# Patient Record
Sex: Male | Born: 1950 | Race: Black or African American | Hispanic: No | Smoking: Former smoker
Health system: Southern US, Community
[De-identification: ages and names within clinical notes are randomized; demographics above are authoritative.]

## PROBLEM LIST (undated history)

## (undated) DIAGNOSIS — R7303 Prediabetes: Secondary | ICD-10-CM

## (undated) DIAGNOSIS — E785 Hyperlipidemia, unspecified: Secondary | ICD-10-CM

## (undated) DIAGNOSIS — I10 Essential (primary) hypertension: Secondary | ICD-10-CM

## (undated) DIAGNOSIS — N529 Male erectile dysfunction, unspecified: Secondary | ICD-10-CM

## (undated) DIAGNOSIS — F4310 Post-traumatic stress disorder, unspecified: Secondary | ICD-10-CM

## (undated) DIAGNOSIS — I509 Heart failure, unspecified: Secondary | ICD-10-CM

## (undated) DIAGNOSIS — G4730 Sleep apnea, unspecified: Secondary | ICD-10-CM

## (undated) DIAGNOSIS — F1910 Other psychoactive substance abuse, uncomplicated: Secondary | ICD-10-CM

## (undated) HISTORY — DX: Heart failure, unspecified: I50.9

## (undated) HISTORY — DX: Male erectile dysfunction, unspecified: N52.9

## (undated) HISTORY — PX: CARDIAC CATHETERIZATION: SHX172

---

## 2000-07-27 ENCOUNTER — Encounter: Payer: Self-pay | Admitting: Emergency Medicine

## 2000-07-27 ENCOUNTER — Emergency Department (HOSPITAL_COMMUNITY): Admission: EM | Admit: 2000-07-27 | Discharge: 2000-07-27 | Payer: Self-pay | Admitting: Emergency Medicine

## 2002-07-04 ENCOUNTER — Emergency Department (HOSPITAL_COMMUNITY): Admission: EM | Admit: 2002-07-04 | Discharge: 2002-07-04 | Payer: Self-pay | Admitting: Emergency Medicine

## 2002-07-04 ENCOUNTER — Encounter: Payer: Self-pay | Admitting: Emergency Medicine

## 2003-06-20 ENCOUNTER — Inpatient Hospital Stay (HOSPITAL_COMMUNITY): Admission: EM | Admit: 2003-06-20 | Discharge: 2003-06-23 | Payer: Self-pay | Admitting: Emergency Medicine

## 2005-03-21 ENCOUNTER — Inpatient Hospital Stay (HOSPITAL_COMMUNITY): Admission: EM | Admit: 2005-03-21 | Discharge: 2005-03-22 | Payer: Self-pay | Admitting: Emergency Medicine

## 2005-03-21 ENCOUNTER — Ambulatory Visit: Payer: Self-pay | Admitting: Cardiovascular Disease

## 2005-04-26 ENCOUNTER — Emergency Department (HOSPITAL_COMMUNITY): Admission: EM | Admit: 2005-04-26 | Discharge: 2005-04-27 | Payer: Self-pay | Admitting: Emergency Medicine

## 2007-03-06 ENCOUNTER — Emergency Department (HOSPITAL_COMMUNITY): Admission: EM | Admit: 2007-03-06 | Discharge: 2007-03-06 | Payer: Self-pay | Admitting: Emergency Medicine

## 2008-11-27 ENCOUNTER — Emergency Department (HOSPITAL_COMMUNITY): Admission: EM | Admit: 2008-11-27 | Discharge: 2008-11-27 | Payer: Self-pay | Admitting: Emergency Medicine

## 2009-02-04 ENCOUNTER — Emergency Department (HOSPITAL_COMMUNITY): Admission: EM | Admit: 2009-02-04 | Discharge: 2009-02-04 | Payer: Self-pay | Admitting: Emergency Medicine

## 2009-04-29 ENCOUNTER — Emergency Department (HOSPITAL_COMMUNITY): Admission: EM | Admit: 2009-04-29 | Discharge: 2009-04-29 | Payer: Self-pay | Admitting: Emergency Medicine

## 2009-09-25 ENCOUNTER — Emergency Department (HOSPITAL_COMMUNITY): Admission: EM | Admit: 2009-09-25 | Discharge: 2009-09-25 | Payer: Self-pay | Admitting: Emergency Medicine

## 2009-11-23 ENCOUNTER — Emergency Department (HOSPITAL_COMMUNITY): Admission: EM | Admit: 2009-11-23 | Discharge: 2009-11-23 | Payer: Self-pay | Admitting: Emergency Medicine

## 2010-02-22 ENCOUNTER — Emergency Department (HOSPITAL_COMMUNITY): Admission: EM | Admit: 2010-02-22 | Discharge: 2010-02-22 | Payer: Self-pay | Admitting: Emergency Medicine

## 2010-02-22 ENCOUNTER — Inpatient Hospital Stay (HOSPITAL_COMMUNITY): Admission: EM | Admit: 2010-02-22 | Discharge: 2010-02-25 | Payer: Self-pay | Admitting: Internal Medicine

## 2010-02-22 ENCOUNTER — Encounter: Payer: Self-pay | Admitting: Emergency Medicine

## 2010-04-23 ENCOUNTER — Inpatient Hospital Stay (HOSPITAL_COMMUNITY)
Admission: EM | Admit: 2010-04-23 | Discharge: 2010-04-27 | Payer: Self-pay | Source: Home / Self Care | Attending: Pulmonary Disease | Admitting: Pulmonary Disease

## 2010-05-25 ENCOUNTER — Inpatient Hospital Stay (HOSPITAL_COMMUNITY)
Admission: EM | Admit: 2010-05-25 | Discharge: 2010-05-27 | DRG: 191 | Disposition: A | Discharge status: Home / Self Care | Payer: Non-veteran care | Attending: Internal Medicine | Admitting: Internal Medicine

## 2010-05-25 DIAGNOSIS — J441 Chronic obstructive pulmonary disease with (acute) exacerbation: Principal | ICD-10-CM | POA: Diagnosis present

## 2010-05-25 DIAGNOSIS — N179 Acute kidney failure, unspecified: Secondary | ICD-10-CM | POA: Diagnosis present

## 2010-05-25 DIAGNOSIS — R9439 Abnormal result of other cardiovascular function study: Secondary | ICD-10-CM | POA: Diagnosis present

## 2010-05-25 DIAGNOSIS — F17200 Nicotine dependence, unspecified, uncomplicated: Secondary | ICD-10-CM | POA: Diagnosis present

## 2010-05-25 DIAGNOSIS — F1410 Cocaine abuse, uncomplicated: Secondary | ICD-10-CM | POA: Diagnosis present

## 2010-05-25 DIAGNOSIS — N189 Chronic kidney disease, unspecified: Secondary | ICD-10-CM | POA: Diagnosis present

## 2010-05-25 LAB — DIFFERENTIAL
Basophils Absolute: 0 10*3/uL (ref 0.0–0.1)
Basophils Relative: 1 % (ref 0–1)
Eosinophils Absolute: 0.3 10*3/uL (ref 0.0–0.7)
Eosinophils Relative: 6 % — ABNORMAL HIGH (ref 0–5)
Lymphocytes Relative: 33 % (ref 12–46)
Lymphs Abs: 1.7 10*3/uL (ref 0.7–4.0)
Monocytes Absolute: 0.6 10*3/uL (ref 0.1–1.0)
Monocytes Relative: 11 % (ref 3–12)
Neutro Abs: 2.6 10*3/uL (ref 1.7–7.7)
Neutrophils Relative %: 50 % (ref 43–77)

## 2010-05-25 LAB — CBC
HCT: 40.5 % (ref 39.0–52.0)
Hemoglobin: 13.4 g/dL (ref 13.0–17.0)
MCH: 30.2 pg (ref 26.0–34.0)
MCHC: 33.1 g/dL (ref 30.0–36.0)
MCV: 91.2 fL (ref 78.0–100.0)
Platelets: 220 10*3/uL (ref 150–400)
RBC: 4.44 MIL/uL (ref 4.22–5.81)
RDW: 13.8 % (ref 11.5–15.5)
WBC: 5.2 10*3/uL (ref 4.0–10.5)

## 2010-05-25 LAB — POCT I-STAT 3, ART BLOOD GAS (G3+)
Acid-base deficit: 3 mmol/L — ABNORMAL HIGH (ref 0.0–2.0)
Bicarbonate: 24 mEq/L (ref 20.0–24.0)
O2 Saturation: 97 %
Patient temperature: 98.7
TCO2: 25 mmol/L (ref 0–100)
pCO2 arterial: 47.5 mmHg — ABNORMAL HIGH (ref 35.0–45.0)
pH, Arterial: 7.312 — ABNORMAL LOW (ref 7.350–7.450)
pO2, Arterial: 96 mmHg (ref 80.0–100.0)

## 2010-05-25 LAB — BASIC METABOLIC PANEL
BUN: 13 mg/dL (ref 6–23)
CO2: 26 mEq/L (ref 19–32)
Calcium: 8.5 mg/dL (ref 8.4–10.5)
Chloride: 109 mEq/L (ref 96–112)
Creatinine, Ser: 1.69 mg/dL — ABNORMAL HIGH (ref 0.4–1.5)
GFR calc Af Amer: 51 mL/min — ABNORMAL LOW (ref >60)
GFR calc non Af Amer: 42 mL/min — ABNORMAL LOW (ref >60)
Glucose, Bld: 103 mg/dL — ABNORMAL HIGH (ref 70–99)
Potassium: 4.2 mEq/L (ref 3.5–5.1)
Sodium: 141 mEq/L (ref 135–145)

## 2010-05-25 LAB — POCT CARDIAC MARKERS
CKMB, poc: 2.9 ng/mL (ref 1.0–8.0)
Myoglobin, poc: 132 ng/mL (ref 12–200)
Troponin i, poc: <0.05 ng/mL (ref 0.00–0.09)

## 2010-05-25 NOTE — H&P (Signed)
NAME:  Gerald Williams, Gerald Williams NO.:  [id number]  MEDICAL RECORD NO.:  [id number]          PATIENT TYPE:  EMS  LOCATION:  MAJO                         FACILITY:  [facility]  PHYSICIAN:  Talmage Nap, MD  DATE OF BIRTH:  1950-11-30  DATE OF ADMISSION:  05/25/2010 DATE OF DISCHARGE:                             HISTORY & PHYSICAL   PRIMARY CARE PHYSICIAN:  Unassigned.  History obtainable from the patient.  CHIEF COMPLAINT:  Shortness of breath of 1 day's duration.  HISTORY OF PRESENT ILLNESS:  The patient is a 60 year old African American male with history of COPD, never been intubated, presenting to the emergency room with shortness of breath of 1 day's duration and this was said to be getting progressively worse.  The patient had been in stable health until about a day prior to presenting to the emergency room, when he got to his home and his room was filled with cigarette smoke which apparently was left by his friends visiting.  After inhaling the smoke, the patient started experiencing shortness of breath with chest tightness and this was getting progressively worse.  He said he had cough that was nonproductive of sputum.  He denied any diaphoresis. He denied any nausea or vomiting.  No fever, no chills, no rigor and subsequently presented to the emergency room to be evaluated.  PAST MEDICAL HISTORY:  COPD.  PAST SURGICAL HISTORY:  None.  PREADMISSION MEDICATIONS:  Without dosages include albuterol, Asmanex Twisthaler, Combivent, and ipratropium bromide.  SOCIAL HISTORY:  Negative for alcohol or tobacco use.  FAMILY HISTORY:  York Spaniel to be positive for hypertension and coronary artery disease.  REVIEW OF SYSTEMS:  The patient denies any history of headaches.  No blurred vision.  No nausea or vomiting.  No fever.  No chills.  No rigor.  Complained of increasing shortness of breath with chest tightness. Denies any diaphoresis.  No PND or orthopnea.  He has  cough that is non-productive of sputum.  No abdominal discomfort.  No diarrhea or hematochezia.  No dysuria or hematuria.  No swelling of the lower extremity.  No intolerance to heat or cold.  No neuropsychiatric disorder.  PHYSICAL EXAMINATION:  GENERAL:  Middle-aged man in respiratory distress, unable to complete a sentence, well hydrated. VITAL SIGNS:  Blood pressure is 111/87, pulse is 78, respiratory rate is 30, and temperature is 97.6. HEENT:  Pupils are reactive to light.  Extraocular muscles are intact. NECK:  No jugular venous distention.  No carotid bruit.  No lymphadenopathy. CHEST:  Widespread expiratory rhonchi.  No rales. CARDIOVASCULAR:  Heart sounds are 1 and 2. ABDOMEN:  Soft and nontender.  Liver, spleen, and kidney nonpalpable. Bowel sounds are positive. EXTREMITIES:  No pedal edema. NEUROLOGIC:  Nonfocal. MUSCULOSKELETAL:  Unremarkable. SKIN:  Normal turgor.  LABORATORY DATA:  Arterial blood gas showed pH of 7.31, pCO2 47.5, pO2 of 96.0 with a bicarb of 25.  Hematological indices showed WBC of 5.2, hemoglobin of 13.4, hematocrit of 40.5, MCV of 91.2 with a platelet count of 220, normal differential.  First set of cardiac markers, troponin I less than 0.05.  Chemistry showed sodium of 141,  potassium of 4.2, chloride of 109 with a bicarb of 28, glucose is 103, BUN is 13, creatinine is 1.69.  EKG showed sinus tachycardia with normal sinus rhythm with a rate of 99, no acute ST-wave change noted.  Chest x-ray showed hyperinflated lungs, no infiltrates seen.  ADMITTING IMPRESSION: 1. Chronic obstructive pulmonary disease exacerbation. 2. Hypercapnia. 3. Mild renal insufficiency.  PLAN:  Admit the patient to telemetry.  The patient will be on O2 via nasal cannula 3 times per minute, albuterol and Atrovent nebs q.4 h., Solu-Medrol 60 mg IV q.12, Avelox 400 mg IV q.24, Protonix 40 mg IV q.24 for GI prophylaxis, and TED stockings for DVT prophylaxis.  Further  labs to be done on the patient include cardiac enzymes q.6 x3.  CBC, CMP, and magnesium will be repeated in a.m. The patient will be followed and evaluated on day-to-day basis.     Talmage Nap, MD     CN/MEDQ  D:  05/25/2010  T:  05/25/2010  Job:  [id number]  Electronically Signed by Talmage Nap  on 05/25/2010 11:41:05 PM

## 2010-05-26 LAB — DIFFERENTIAL
Eosinophils Absolute: 0 10*3/uL (ref 0.0–0.7)
Eosinophils Relative: 0 % (ref 0–5)
Lymphocytes Relative: 10 % — ABNORMAL LOW (ref 12–46)
Lymphs Abs: 0.5 10*3/uL — ABNORMAL LOW (ref 0.7–4.0)
Monocytes Relative: 0 % — ABNORMAL LOW (ref 3–12)
Neutrophils Relative %: 89 % — ABNORMAL HIGH (ref 43–77)

## 2010-05-26 LAB — DRUGS OF ABUSE SCREEN W/O ALC, ROUTINE URINE
Amphetamine Screen, Ur: NEGATIVE
Barbiturate Quant, Ur: NEGATIVE
Cocaine Metabolites: POSITIVE — AB
Methadone: NEGATIVE
Phencyclidine (PCP): NEGATIVE

## 2010-05-26 LAB — CK TOTAL AND CKMB (NOT AT ARMC)
CK, MB: 10.6 ng/mL (ref 0.3–4.0)
Relative Index: 1.9 (ref 0.0–2.5)
Total CK: 569 U/L — ABNORMAL HIGH (ref 7–232)

## 2010-05-26 LAB — COMPREHENSIVE METABOLIC PANEL
Alkaline Phosphatase: 48 U/L (ref 39–117)
BUN: 14 mg/dL (ref 6–23)
Chloride: 108 mEq/L (ref 96–112)
Creatinine, Ser: 1.76 mg/dL — ABNORMAL HIGH (ref 0.4–1.5)
GFR calc non Af Amer: 40 mL/min — ABNORMAL LOW (ref >60)
Glucose, Bld: 196 mg/dL — ABNORMAL HIGH (ref 70–99)
Potassium: 4.4 mEq/L (ref 3.5–5.1)
Total Bilirubin: 0.6 mg/dL (ref 0.3–1.2)

## 2010-05-26 LAB — CARDIAC PANEL(CRET KIN+CKTOT+MB+TROPI)
CK, MB: 10.6 ng/mL (ref 0.3–4.0)
Relative Index: 2.2 (ref 0.0–2.5)
Total CK: 513 U/L — ABNORMAL HIGH (ref 7–232)
Troponin I: <0.01 ng/mL (ref 0.00–0.06)
Troponin I: <0.01 ng/mL (ref 0.00–0.06)

## 2010-05-26 LAB — URINALYSIS, MICROSCOPIC ONLY
Ketones, ur: NEGATIVE mg/dL
Leukocytes, UA: NEGATIVE
Nitrite: NEGATIVE
Specific Gravity, Urine: 1.014 (ref 1.005–1.030)
Urine Glucose, Fasting: NEGATIVE mg/dL
pH: 5.5 (ref 5.0–8.0)

## 2010-05-26 LAB — CBC
HCT: 41.4 % (ref 39.0–52.0)
MCH: 30.9 pg (ref 26.0–34.0)
MCV: 91.4 fL (ref 78.0–100.0)
Platelets: 229 10*3/uL (ref 150–400)
RBC: 4.53 MIL/uL (ref 4.22–5.81)
WBC: 4.7 10*3/uL (ref 4.0–10.5)

## 2010-05-26 LAB — BRAIN NATRIURETIC PEPTIDE: Pro B Natriuretic peptide (BNP): <30 pg/mL (ref 0.0–100.0)

## 2010-05-26 LAB — TROPONIN I: Troponin I: <0.01 ng/mL (ref 0.00–0.06)

## 2010-05-27 LAB — BASIC METABOLIC PANEL
BUN: 13 mg/dL (ref 6–23)
CO2: 22 mEq/L (ref 19–32)
Chloride: 106 mEq/L (ref 96–112)
Creatinine, Ser: 1.48 mg/dL (ref 0.4–1.5)

## 2010-05-27 LAB — CBC
Hemoglobin: 12.8 g/dL — ABNORMAL LOW (ref 13.0–17.0)
MCH: 30.8 pg (ref 26.0–34.0)
MCHC: 34 g/dL (ref 30.0–36.0)
MCV: 90.4 fL (ref 78.0–100.0)
Platelets: 209 10*3/uL (ref 150–400)
RBC: 4.16 MIL/uL — ABNORMAL LOW (ref 4.22–5.81)

## 2010-05-29 LAB — COCAINE, URINE, CONFIRMATION: Benzoylecgonine GC/MS Conf: 6074 ng/mL — ABNORMAL HIGH

## 2010-06-03 NOTE — Discharge Summary (Signed)
NAME:  Gerald Williams, Gerald Williams              ACCOUNT NO.:  [id number]  MEDICAL RECORD NO.:  [id number]           PATIENT TYPE:  I  LOCATION:  4735                         FACILITY:  [facility]  PHYSICIAN:  Hartley Barefoot, MD    DATE OF BIRTH:  31-Jan-1951  DATE OF ADMISSION:  05/25/2010 DATE OF DISCHARGE:  05/27/2010                              DISCHARGE SUMMARY   DISCHARGE DIAGNOSES: 1. Chronic obstructive pulmonary disease exacerbation. 2. Substance abuse positive for cocaine. 3. Acute-on-chronic renal failure. 4. Chronic elevation of CK-MB.  OTHER PAST MEDICAL HISTORY: 1. COPD. 2. History of polysubstance abuse. 3. History of steroid-induced hyperglycemia.  DISCHARGE MEDICATIONS: 1. Moxifloxacin 400 mg 1 tablet by mouth for three more days. 2. Prednisone 10 mg taper dose, he will take 4 tablets for 2 days,     then 2 tablets for 2 days, then 1 tablet, then stop. 3. Albuterol nebulizations 2.5 mg one nebulization inhaled every 6     hours as needed. 4. Asmanex 220 mcg 1 puff inhaled daily. 5. Combivent 2 puffs inhaled twice daily. 6. Ipratropium nebulization, 1 nebulization inhale every 6 hours. 7. Nicotine patch 21 mcg daily.  [name]  STUDIES PERFORMED:  Chest x-ray May 25, 2010, showed COPD.  No active disease.  BRIEF HISTORY OF PRESENT ILLNESS:  This is a 60 year old African American with past medical history of COPD, never intubated presented to the emergency department complaining of shortness of breath that started a day prior to admission.  The patient relayed that shortness of breath was progressively getting worse.  He was in contact with a cigarette smoke, passive smoking.  He denies cough, fevers, chills.  Denies recreational drugs.  HOSPITAL COURSE: 1. COPD exacerbation in the setting of passive smoker and cocaine use.     The patient was admitted to telemetry.  He was started on Solu-     Medrol 60 mg IV q.12 h. And Avelox.   His shortness of breath     improved.  On the day of discharge, no wheezing on lung exam.  He     will be discharged on prednisone taper dose and he will need three     more days of Avelox.  He will need to follow with primary care     physician.  He was advised to stop smoking and using recreational     drugs. 2. Acute-on-chronic renal failure, creatinine on last hospitalization     at the day of discharge was at 1.58.  Reviewing records, his     creatinine range is  from 1.2 to 1.9. After gentle hydration with 75 mL     of normal saline for 24 hours, his creatinine decreased from 1.75     to 1.46.  He will need to follow up with his primary care     physician. 3. Chronic elevation of CK-MB.  EKG with no ST changes.  Troponin x3     negative.  In reviewing records, the patient had chronic elevation     of CK-MB.  He will need to follow with his primary care  physician     and he will need workup as an outpatient.  On the day of discharge, the patient was in good condition.  Shortness of breath has resolved.  DISCHARGE VITAL SIGNS:  Temperature 97.6, pulse 78, respirations 18, blood pressure 102/65, sats 93% on room air.  DISCHARGE LABS:  Sodium 137, potassium 4.3, chloride 106, bicarb 22, glucose 163, BUN 13, creatinine 1.48.  White blood cell 8.5, hemoglobin 12.8, platelets 209.  The patient was discharged in improved condition.     Hartley Barefoot, MD     BR/MEDQ  D:  05/27/2010  T:  05/28/2010  Job:  [id number]  cc:   [facility]  Electronically Signed by Hartley Barefoot MD on 06/03/2010 08:12:03 AM

## 2010-06-11 ENCOUNTER — Inpatient Hospital Stay (HOSPITAL_COMMUNITY)
Admission: EM | Admit: 2010-06-11 | Discharge: 2010-06-13 | DRG: 191 | Disposition: A | Discharge status: Home / Self Care | Payer: Non-veteran care | Attending: Internal Medicine | Admitting: Internal Medicine

## 2010-06-11 ENCOUNTER — Emergency Department (HOSPITAL_COMMUNITY): Payer: Non-veteran care

## 2010-06-11 DIAGNOSIS — N182 Chronic kidney disease, stage 2 (mild): Secondary | ICD-10-CM | POA: Diagnosis present

## 2010-06-11 DIAGNOSIS — F1410 Cocaine abuse, uncomplicated: Secondary | ICD-10-CM | POA: Diagnosis present

## 2010-06-11 DIAGNOSIS — J441 Chronic obstructive pulmonary disease with (acute) exacerbation: Principal | ICD-10-CM | POA: Diagnosis present

## 2010-06-11 DIAGNOSIS — Z79899 Other long term (current) drug therapy: Secondary | ICD-10-CM

## 2010-06-11 DIAGNOSIS — IMO0002 Reserved for concepts with insufficient information to code with codable children: Secondary | ICD-10-CM

## 2010-06-11 DIAGNOSIS — K219 Gastro-esophageal reflux disease without esophagitis: Secondary | ICD-10-CM | POA: Diagnosis present

## 2010-06-11 DIAGNOSIS — F17200 Nicotine dependence, unspecified, uncomplicated: Secondary | ICD-10-CM | POA: Diagnosis present

## 2010-06-11 DIAGNOSIS — N179 Acute kidney failure, unspecified: Secondary | ICD-10-CM | POA: Diagnosis present

## 2010-06-11 LAB — DIFFERENTIAL
Eosinophils Absolute: 0.6 10*3/uL (ref 0.0–0.7)
Eosinophils Relative: 13 % — ABNORMAL HIGH (ref 0–5)
Lymphs Abs: 1.5 10*3/uL (ref 0.7–4.0)
Monocytes Relative: 8 % (ref 3–12)

## 2010-06-11 LAB — BASIC METABOLIC PANEL
CO2: 27 mEq/L (ref 19–32)
Calcium: 8.7 mg/dL (ref 8.4–10.5)
Chloride: 109 mEq/L (ref 96–112)
GFR calc Af Amer: 43 mL/min — ABNORMAL LOW (ref >60)
Sodium: 142 mEq/L (ref 135–145)

## 2010-06-11 LAB — CK TOTAL AND CKMB (NOT AT ARMC)
CK, MB: 4.5 ng/mL — ABNORMAL HIGH (ref 0.3–4.0)
Relative Index: 1 (ref 0.0–2.5)

## 2010-06-11 LAB — CBC
MCH: 31.6 pg (ref 26.0–34.0)
MCHC: 35.1 g/dL (ref 30.0–36.0)
MCV: 90 fL (ref 78.0–100.0)
Platelets: 212 10*3/uL (ref 150–400)
RDW: 13.5 % (ref 11.5–15.5)

## 2010-06-12 ENCOUNTER — Inpatient Hospital Stay (HOSPITAL_COMMUNITY): Payer: Non-veteran care

## 2010-06-12 LAB — MRSA PCR SCREENING: MRSA by PCR: NEGATIVE

## 2010-06-12 LAB — COMPREHENSIVE METABOLIC PANEL
ALT: 17 U/L (ref 0–53)
AST: 31 U/L (ref 0–37)
Albumin: 3.1 g/dL — ABNORMAL LOW (ref 3.5–5.2)
Calcium: 8.7 mg/dL (ref 8.4–10.5)
GFR calc Af Amer: 52 mL/min — ABNORMAL LOW (ref >60)
Sodium: 140 mEq/L (ref 135–145)
Total Protein: 5.6 g/dL — ABNORMAL LOW (ref 6.0–8.3)

## 2010-06-12 LAB — CARDIAC PANEL(CRET KIN+CKTOT+MB+TROPI)
CK, MB: 6.3 ng/mL (ref 0.3–4.0)
Relative Index: 1.2 (ref 0.0–2.5)
Total CK: 474 U/L — ABNORMAL HIGH (ref 7–232)

## 2010-06-12 LAB — CBC
Hemoglobin: 13.9 g/dL (ref 13.0–17.0)
MCHC: 34.5 g/dL (ref 30.0–36.0)
RDW: 13.5 % (ref 11.5–15.5)

## 2010-06-12 LAB — MAGNESIUM: Magnesium: 2.4 mg/dL (ref 1.5–2.5)

## 2010-06-13 LAB — BASIC METABOLIC PANEL
CO2: 20 mEq/L (ref 19–32)
GFR calc non Af Amer: 47 mL/min — ABNORMAL LOW (ref >60)
Glucose, Bld: 150 mg/dL — ABNORMAL HIGH (ref 70–99)
Potassium: 4.8 mEq/L (ref 3.5–5.1)
Sodium: 140 mEq/L (ref 135–145)

## 2010-06-21 NOTE — Discharge Summary (Signed)
NAME:  Gerald Williams, Gerald Williams              ACCOUNT NO.:  [id number]  MEDICAL RECORD NO.:  [id number]           PATIENT TYPE:  I  LOCATION:  2506                         FACILITY:  [facility]  PHYSICIAN:  Hartley Barefoot, MD    DATE OF BIRTH:  06-01-1950  DATE OF ADMISSION:  06/11/2010 DATE OF DISCHARGE:  06/13/2010                              DISCHARGE SUMMARY   DISCHARGE DIAGNOSES: 1. Chronic obstructive pulmonary disease exacerbation. 2. Acute-on-chronic renal failure. 3. Chronic elevation of CK-MB. 4. Substance abuse.  OTHER PAST MEDICAL HISTORY: 1. History of steroid-induced hyperglycemia. 2. History of COPD.  DISCHARGE MEDICATIONS: 1. Avelox 1 tablet by mouth daily for 3 more days. 2. Prednisone taper 10 mg tablet, he will take 4 tablets for 2 days     then 2 tablets for 2 days then 1 tablet for 2 days then half tablet     then stop. 3. Albuterol inhale every 6 hours as needed. 4. Asmanex 1 puff inhale daily. 5. Combivent 2 puffs inhale b.i.d. 6. Ipratropium nebulization every 6 hours.  DISPOSITION AND FOLLOWUP:  Mr. Gerald Williams wants to go home today.  He is feeling well.  He will need to follow with his primary care physician, at the [place].  During that appointment, kidney function need to be followed up, his COPD status, he will need a 2-D echo due to his chronic elevation of CK-MB.  BRIEF HISTORY OF PRESENT ILLNESS:  This is a 60 year old that presents complaining of shortness of breath that started 2-3 days prior to admission.  He was having some cold-like symptoms and cough and congestion.  He initially treated himself with over-the-counter Tylenol. He was having a lot of difficulty with shortness of breath on exertion and shortness of breath was getting worse, so he decided to come to the emergency department.  STUDIES PERFORMED:  Renal ultrasound on June 12, 2010, showed negative for hydronephrosis or acute finding by ultrasound.  Chest x-ray June 11, 2010, showed  COPD.  No active disease.  HOSPITAL COURSE: 1. COPD exacerbation.  The patient was admitted to the step-down unit.     He required IV Solu-Medrol 60 mg q.12 h.  Avelox was started at 400     mg IV.  During the course of hospitalization, his shortness of     breath improved and he was transitioned to prednisone taper dose.     On ambulation, the patient's sats was 95 on room air.  He says     that he is feeling better and he is not having any shortness of     breath and he wants to go home.  He will need to follow with his     primary care physician. 2. Polysubstance abuse.  The patient was cocaine positive during this     admission.  He was counseled.  He relates that he is going to     follow at the [place] and that he will try to do rehab.  A friend will     help him to get into a rehab facility for drugs. 3. Acute-on-chronic renal failure.  Per records, creatinine goes from  1.4 to 1.9.  During his last hospitalization, his creatinine     increased up to 1.76.  After IV fluid his creatinine on the date of     discharge, was as 1.48.  During this hospitalization, on June 11, 2010, creatinine was 1.96.  After IV fluid, creatinine     decreased to 1.53.  Renal ultrasound was ordered and it was     negative for hydronephrosis or obstruction.  His blood pressure has     remained stable in the 114 range.  He will need further workup.  He     had a prior urinalysis on April 03, 2010, that was negative for     protein.  He will need to further workup and he had urine     microscopy on May 26, 2010, that was also negative for protein.     He will need to follow up with his primary care physician for     further evaluation. 4. Chronic elevation of CK-MB.  This could be secondary to decreased     renal clearence.  He will need to follow with his primary care     physician.  He will need a 2-D echo.  During this admission,     troponin remained negative.  EKG without changes.  On  the day of discharge, the patient was in improved condition.  No shortness of breath.  Sats 100% on room air, blood pressure 114/75, respirations 18, pulse 78, temperature 97.7.  DISCHARGE LABS:  Sodium 140, potassium 4.8, chloride 114, bicarb 20, glucose 150, BUN 20, creatinine 1.53.  CBC on June 12, 2010, white blood cell 4.5, hemoglobin 13.9, platelets 220.  The patient was discharged in improved condition.     Hartley Barefoot, MD     BR/MEDQ  D:  06/13/2010  T:  06/14/2010  Job:  [id number]  Electronically Signed by Hartley Barefoot MD on 06/21/2010 10:11:57 AM

## 2010-06-28 NOTE — H&P (Signed)
NAME:  Gerald Williams, Gerald Williams NO.:  [id number]  MEDICAL RECORD NO.:  [id number]           PATIENT TYPE:  LOCATION:                                 FACILITY:  PHYSICIAN:  Della Goo, M.D. DATE OF BIRTH:  1950/08/29  DATE OF ADMISSION: DATE OF DISCHARGE:                             HISTORY & PHYSICAL   CHIEF COMPLAINT:  Shortness of breath.  PRIMARY CARE PHYSICIAN:  [facility]  HISTORY OF PRESENT ILLNESS:  The patient is a 60 year old male who presents with a chief complaint of progressive shortness of breath over the course of the past 2-3 days.  The patient states that his symptoms started last Sunday in which he had cold-like symptoms of cough and congestion.  He treated himself with over-the-counter Tylenol Cold and Flu.  He stated as of Tuesday he felt much better.  The patient is on veterans disability.  He stated that he [place]  He stated on Wednesday when he woke up he felt much worse.  He was unable to do his normal activities.  Walking, he reports, causes increased shortness of breath.  He had increase in cough.  He had to use his albuterol more. 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  He stated that he did complete his steroid taper; however, he was unable to tell me when this was.  The patient does have a history of cocaine abuse.  His urinary drug screen today shows that he is positive for cocaine abuse.  He denies that he used cocaine state.  However, he states that he was around someone who used it.  The patient states that he has now quit smoking.  He states that he quit smoking 4 days ago secondary to being unable to breathe while smoking.  He does report cough with sputum production.  He states the sputum is white and more than his usual.  He denies rigors, chills, or fever.  The patient  denies any change in stool color, abdominal pain, or dysuria.  The patient states that on his last discharge he was told that he had problems with his kidneys and he was to see his doctor about this.  He has an appointment at the [facility] this coming Wednesday on June 16, 2010, for evaluation of chronic kidney disease.  Per the patient's report, he has been using his nebulizers as directed.  He did state that he went through an entire albuterol inhaler today.  He denies any rhinorrhea or ear pain at present.  He has had his flu shot and pneumococcal vaccination on his last admission.  PAST MEDICAL HISTORY: 1. COPD. 2. Questionable chronic kidney disease. 3. Substance abuse, cocaine. 4. Chronic elevation of CK-MB.  PAST SURGICAL HISTORY:  None.  SOCIAL HISTORY:  Positive for substance abuse.  After further discussion with the patient, he did admit to cocaine abuse.  The patient denies alcohol abuse.  The patient has recently been unable to use tobacco; however, he has a 50-year tobacco abuse history, currently smoking one and  half to two packs of cigarettes per day.  FAMILY HISTORY:  Positive for hypertension and coronary artery disease.  MEDICATIONS:  Albuterol, Asmanex, Combivent, and ipratropium bromide.  ALLERGIES:  No known medicine allergies.  REVIEW OF SYSTEMS:  GENERAL:  The patient denies weight change, fatigue or weakness, fever, chills, or night sweats.  SKIN:  The patient denies any changes in skin, hair, nails.  HEENT:  The patient denies headache, change in vision, tinnitus, rhinorrhea or stuffiness, or hoarseness or sore throat.  CARDIAC:  The patient denies hypertension, murmur, orthopnea.  RESPIRATORY:  The patient does report shortness of breath, wheeze, cough, and sputum.  Denies hemoptysis.  GI:  The patient denies change in appetite, nausea, vomiting, or indigestion.  URINARY:  The patient reports significant nocturia.  Denies frequency, hesitancy,  or urgency.  VASCULAR:  The patient denies leg edema or claudication. MUSCULOSKELETAL:  The patient denies muscle weakness, joint stiffness, or change in range of motion.  NEUROLOGIC:  The patient denies loss of sensation, numbness, tingling, or tremors.  HEMATOLOGIC:  The patient denies anemia, easy bruising, bleeding.  ENDOCRINE:  The patient denies heat or cold intolerance.  PSYCHIATRIC:  The patient denies weight changes, anxiety, or depression.  PHYSICAL EXAMINATION:  VITALS:  Blood pressure 109/76, respiration rate 20, pulse rate 80. GENERAL:  The patient is a thin-appearing African American male in no apparent distress. SKIN:  Appears to be intact with no rashes or bruises. HEENT:  Head normocephalic, atraumatic.  Pupils round and reactive to light.  Ears are normal.  Nose normal.  Trachea midline. NECK:  Supple. CARDIOVASCULAR:  Normal S1 and S2.  Regular rate and regular rhythm. LUNGS:  He has expiratory and inspiratory wheezes throughout.  His rate of air movement is somewhat limited; however, he does not appear to be in respiratory distress. ABDOMEN:  Soft, nontender.  Positive bowel sounds. GU:  Deferred. MUSCULOSKELETAL:  There are no signs of muscle atrophy or weakness. Full joint range of motion in upper and lower extremities. LYMPHATIC:  No lymphadenopathy. NEUROLOGIC:  Alert and oriented x3.  Cranial nerves II through II are grossly intact.  LABORATORY DATA:  Urinary drug screen was positive for cocaine.  Sodium 142, potassium 4, chloride 109, glucose 126, BUN 18, creatinine of 1.96 with a previous of 1.48 on previous discharge on May 27, 2010.  CK- MB of 4.5, total CK of 463, troponin was less than 0.01.  White blood cell count 4.8, hemoglobin 13.9, hematocrit 39.6, platelets 212,000.  RADIOLOGY:  Portable chest shows stable hyperinflation of the lungs. Heart and mediastinal contours are within normal limits.  No focal opacities or effusions.  No acute bony  abnormality.  No active disease. COPD.  ASSESSMENT AND PLAN: 1. Chronic obstructive pulmonary disease exacerbation.  As the patient     was evaluated by Dr. Della Goo, the patient started to     decompensate rather quickly.  Dr. Lovell Sheehan noticed that the patient     was having a hard time with airway movement.  Respiratory Therapy     was called to assess the patient at that time.  Dr. Della Goo ordered magnesium and high-dose albuterol.  The patient was     then evaluated to be in a stepdown status.  There were no signs of     pneumonia.  Per the patient, he has not been exposed to any viral     illnesses.  However, the patient has been notoriously on the  street     and exposed to multiple people in and out of his home.  We will     start Solu-Medrol IV 60 mg q.12 h., Avelox 400 mg IV daily.  We     will continue the patient on nebs to keep his pulse ox at or above     90%.  At this point, I am unclear of what has triggered his COPD     exacerbation.  Compliance may be an issue as well as his substance     abuse.  The patient does seem to be over using his albuterol.  The     patient was on his tobacco cessation program with a nicotine patch.     He stated that the nicotine patch was causing palpations.  He     removed that patch and went back to smoking.  However, it likely     could be his cocaine abuse or his albuterol use that could be     contributing to heart palpitations.  He stated that after removing     the nicotine patch, however, his palpitations resolved.  The patient     could benefit from substance abuse counseling as well as smoking     cessation counseling.  However, the patient is likely in the     precontemplated or contemplated stages at this time.  We will also     place the patient on IV Protonix for GI prophylaxis. 2. Polysubstance abuse.  The patient is notoriously known to our     service for cocaine abuse.  The patient adamantly denied this  prior     to me asking about his cocaine abuse.  However, upon further     questioning by myself and Dr. Lovell Sheehan he did admit to cocaine     abuse.  However, it is unclear about how much he is using this.  He     denies any IV usage.  He states that he does smoke when he is using     it.  The patient does deny alcohol abuse; however, the patient may     go through withdrawals.  We will closely monitor this.  I do not     see at this point any reason to place the patient on the CIWA.  Per     his last admission, he did not need this and he was admitted from     May 25, 2010, through May 28, 2010, without any signs or     symptoms of withdrawal. 3. Acute on chronic kidney disease.  The patient's creatinine has     again risen to 1.96 from 1.48 at discharge on May 27, 2010.     Upon his last admission, his creatinine seems to fluctuate anywhere     from 1.4 to 1.9.  This did improve with gentle hydration of fluids.     It is likely the patient has had difficulties with p.o. intake     secondary to his shortness of breath.  We will gently hydrate with     75 mL an hour at this time.  Due to the patient about to have his     kidneys evaluated at the [facility], it is likely he may miss his     appointment and his creatinine continues to elevate.  I have     ordered a renal ultrasound to be obtained to further evaluate     causes for his  kidney disease.  We will closely monitor his urine     output, fluid status, and creatinine level.  The patient denies     anyone in his family with chronic kidney disease. 4. Chronic elevation of CK-MB.  It seems the patient has chronic     elevation of his CK-MB.  This is likely secondary to his chronic     substance abuse issues.  However, I will obtain cardiac enzymes q.6     h. x3.  I do not see an EKG here in the emergency department for my     review.  It appears per ER report it is in normal sinus rhythm;     however, I do not physically  sees the EKGs for my review.  I will     order another EKG in the morning.  Due to the patient's significant     substance abuse history multiple admissions, the patient may likely     benefit from further cardiac evaluation with a 2-D cardiac echo.  I     will leave this up to the discretion of the rounding providers in     the morning. 5. Deep vein thrombosis prophylaxis.  Heparin. 6. The patient is a full code.    ______________________________ Arlyn Leak, PA-C   ______________________________ Della Goo, M.D.    JH/MEDQ  D:  06/12/2010  T:  06/12/2010  Job:  [id number]  Electronically Signed by Arlyn Leak PA on 06/13/2010 05:29:32 AM Electronically Signed by Della Goo M.D. on 06/28/2010 08:19:36 PM

## 2010-07-05 LAB — CULTURE, BLOOD (ROUTINE X 2)
Culture  Setup Time: 201112311057
Culture  Setup Time: 201112311057
Culture: NO GROWTH
Culture: NO GROWTH

## 2010-07-05 LAB — COMPREHENSIVE METABOLIC PANEL
ALT: 20 U/L (ref 0–53)
AST: 26 U/L (ref 0–37)
Albumin: 3 g/dL — ABNORMAL LOW (ref 3.5–5.2)
Calcium: 8.5 mg/dL (ref 8.4–10.5)
Creatinine, Ser: 1.69 mg/dL — ABNORMAL HIGH (ref 0.4–1.5)
GFR calc Af Amer: 51 mL/min — ABNORMAL LOW (ref >60)
Sodium: 142 mEq/L (ref 135–145)
Total Protein: 5.4 g/dL — ABNORMAL LOW (ref 6.0–8.3)

## 2010-07-05 LAB — CBC
HCT: 40.1 % (ref 39.0–52.0)
HCT: 46.5 % (ref 39.0–52.0)
Hemoglobin: 13.5 g/dL (ref 13.0–17.0)
Hemoglobin: 14.3 g/dL (ref 13.0–17.0)
Hemoglobin: 15.7 g/dL (ref 13.0–17.0)
MCH: 30.8 pg (ref 26.0–34.0)
MCH: 31 pg (ref 26.0–34.0)
MCH: 31 pg (ref 26.0–34.0)
MCHC: 33.3 g/dL (ref 30.0–36.0)
MCHC: 33.7 g/dL (ref 30.0–36.0)
MCHC: 33.8 g/dL (ref 30.0–36.0)
MCV: 92.2 fL (ref 78.0–100.0)
Platelets: 210 10*3/uL (ref 150–400)
Platelets: 210 10*3/uL (ref 150–400)
RBC: 4.28 MIL/uL (ref 4.22–5.81)
RBC: 4.35 MIL/uL (ref 4.22–5.81)
RBC: 5.07 MIL/uL (ref 4.22–5.81)
RDW: 14.5 % (ref 11.5–15.5)
WBC: 9.7 10*3/uL (ref 4.0–10.5)

## 2010-07-05 LAB — POCT I-STAT 3, ART BLOOD GAS (G3+)
Bicarbonate: 21.8 mEq/L (ref 20.0–24.0)
O2 Saturation: 88 %
Patient temperature: 98.6
TCO2: 23 mmol/L (ref 0–100)
pH, Arterial: 7.369 (ref 7.350–7.450)

## 2010-07-05 LAB — BASIC METABOLIC PANEL
BUN: 15 mg/dL (ref 6–23)
CO2: 26 mEq/L (ref 19–32)
Calcium: 8.8 mg/dL (ref 8.4–10.5)
Calcium: 9 mg/dL (ref 8.4–10.5)
Chloride: 113 mEq/L — ABNORMAL HIGH (ref 96–112)
Creatinine, Ser: 1.58 mg/dL — ABNORMAL HIGH (ref 0.4–1.5)
GFR calc Af Amer: 55 mL/min — ABNORMAL LOW (ref >60)
Glucose, Bld: 154 mg/dL — ABNORMAL HIGH (ref 70–99)
Potassium: 4.5 mEq/L (ref 3.5–5.1)
Sodium: 144 mEq/L (ref 135–145)

## 2010-07-05 LAB — URINALYSIS, ROUTINE W REFLEX MICROSCOPIC
Bilirubin Urine: NEGATIVE
Glucose, UA: NEGATIVE mg/dL
Hgb urine dipstick: NEGATIVE
Ketones, ur: NEGATIVE mg/dL
Protein, ur: NEGATIVE mg/dL
pH: 5 (ref 5.0–8.0)

## 2010-07-05 LAB — DIFFERENTIAL
Basophils Relative: 1 % (ref 0–1)
Eosinophils Absolute: 0 10*3/uL (ref 0.0–0.7)
Eosinophils Relative: 0 % (ref 0–5)
Lymphocytes Relative: 10 % — ABNORMAL LOW (ref 12–46)
Lymphs Abs: 0.4 10*3/uL — ABNORMAL LOW (ref 0.7–4.0)
Lymphs Abs: 2.4 10*3/uL (ref 0.7–4.0)
Monocytes Absolute: 0 10*3/uL — ABNORMAL LOW (ref 0.1–1.0)
Monocytes Absolute: 0.5 10*3/uL (ref 0.1–1.0)
Monocytes Relative: 1 % — ABNORMAL LOW (ref 3–12)
Monocytes Relative: 7 % (ref 3–12)
Neutro Abs: 3.2 10*3/uL (ref 1.7–7.7)

## 2010-07-05 LAB — POCT CARDIAC MARKERS
CKMB, poc: 3.9 ng/mL (ref 1.0–8.0)
Troponin i, poc: <0.05 ng/mL (ref 0.00–0.09)

## 2010-07-05 LAB — GLUCOSE, CAPILLARY
Glucose-Capillary: 105 mg/dL — ABNORMAL HIGH (ref 70–99)
Glucose-Capillary: 109 mg/dL — ABNORMAL HIGH (ref 70–99)
Glucose-Capillary: 123 mg/dL — ABNORMAL HIGH (ref 70–99)
Glucose-Capillary: 155 mg/dL — ABNORMAL HIGH (ref 70–99)
Glucose-Capillary: 163 mg/dL — ABNORMAL HIGH (ref 70–99)
Glucose-Capillary: 178 mg/dL — ABNORMAL HIGH (ref 70–99)
Glucose-Capillary: 91 mg/dL (ref 70–99)

## 2010-07-05 LAB — BLOOD GAS, ARTERIAL
Bicarbonate: 22.4 mEq/L (ref 20.0–24.0)
Drawn by: 23588
Expiratory PAP: 6
FIO2: 0.3 %
Inspiratory PAP: 10
O2 Saturation: 96.2 %
Patient temperature: 98.6
pH, Arterial: 7.385 (ref 7.350–7.450)
pO2, Arterial: 82.9 mmHg (ref 80.0–100.0)

## 2010-07-05 LAB — URINE CULTURE
Colony Count: NO GROWTH
Culture  Setup Time: 201112311105
Culture: NO GROWTH

## 2010-07-05 LAB — POCT I-STAT, CHEM 8
Calcium, Ion: 1.1 mmol/L — ABNORMAL LOW (ref 1.12–1.32)
Chloride: 111 mEq/L (ref 96–112)
Creatinine, Ser: 1.8 mg/dL — ABNORMAL HIGH (ref 0.4–1.5)
Glucose, Bld: 103 mg/dL — ABNORMAL HIGH (ref 70–99)
HCT: 50 % (ref 39.0–52.0)

## 2010-07-05 LAB — RAPID URINE DRUG SCREEN, HOSP PERFORMED
Barbiturates: NOT DETECTED
Benzodiazepines: NOT DETECTED
Cocaine: POSITIVE — AB
Opiates: NOT DETECTED

## 2010-07-05 LAB — CARDIAC PANEL(CRET KIN+CKTOT+MB+TROPI)
CK, MB: 8.1 ng/mL (ref 0.3–4.0)
Relative Index: 2.4 (ref 0.0–2.5)
Total CK: 309 U/L — ABNORMAL HIGH (ref 7–232)
Troponin I: 0.01 ng/mL (ref 0.00–0.06)

## 2010-07-05 LAB — CK TOTAL AND CKMB (NOT AT ARMC)
CK, MB: 7.9 ng/mL (ref 0.3–4.0)
Total CK: 511 U/L — ABNORMAL HIGH (ref 7–232)

## 2010-07-05 LAB — MAGNESIUM: Magnesium: 2.3 mg/dL (ref 1.5–2.5)

## 2010-07-05 LAB — MRSA PCR SCREENING: MRSA by PCR: NEGATIVE

## 2010-07-05 NOTE — H&P (Signed)
NAME:  Gerald Williams, Gerald Williams              ACCOUNT NO.:  [id number]  MEDICAL RECORD NO.:  [id number]          PATIENT TYPE:  INP  LOCATION:  1823                         FACILITY:  [facility]  PHYSICIAN:  Gerald Masker, MD   DATE OF BIRTH:  Jan 20, 1951  DATE OF ADMISSION:  04/23/2010 DATE OF DISCHARGE:                             HISTORY & PHYSICAL   REASON FOR ADMISSION:  COPD exacerbation.  HISTORY OF PRESENT ILLNESS:  The patient is a 60 year old African American male with past medical history significant for COPD, history of polysubstance abuse and tobacco abuse who presented to [facility] with a two day history of worsening shortness of breath. The patient reports that for the past two days his shortness of breath has been getting worse despite using his inhalers at home.  He also noted some white sputum production and problems wheezing.  He denies any chest pain, fevers or chills.  No edema.  The patient does report that he has been struggling with attempts to quit smoking and usually smokes several cigarettes per day.  However, over the past 48 hours he smoked one cigarette.  He reports that he was hospitalized recently from November 1st until November 4th with similar symptoms.  PAST MEDICAL HISTORY: 1. COPD. 2. Tobacco abuse. 3. Polysubstance abuse. 4. Recent admission from February 23, 2010 to February 26, 2010 for COPD     exacerbation.  ALLERGIES:  No known drug allergies.  MEDICATIONS AT HOME:  Combivent, albuterol p.r.n.  SOCIAL HISTORY:  The patient smokes cigarettes however, has been trying to quit.  Drinks alcohol socially.  Used to abuse illicit drugs, however, not in the recent past.  He is unmarried and lives alone.  REVIEW OF SYSTEMS:  Negative other than the issues mentioned in the HPI.  FAMILY HISTORY:  Noncontributory.  PHYSICAL EXAM:  Blood pressure 102/72, heart rate 83, respirations 24, saturating 100%, temperature 97.8.  Patient is  currently on BiPAP. GENERAL APPEARANCE:  Comfortable, able to communicate in full sentences. Awake and alert, sitting up on the stretcher. HEENT: Pupils equal, round and reactive to light.  Extraocular muscle movements intact.  Anicteric. NECK:  Supple.  No JVD. LUNGS:  Diminished breath sounds with scattered expiratory wheezes and prolonged expiratory phase. HEART:  Regular rate and rhythm.  S1, S2 appreciated.  No murmurs. ABDOMEN:  Soft, not tender, not distended.  Bowel sounds appreciated. EXTREMITIES:  Thin.  No clubbing, no cyanosis, no edema.  Labs:  White blood cell count 6.6, hemoglobin 15.7, hematocrit 46.5, platelets 228.  Sodium 134, potassium 3.7, chloride 115, bicarb 24, glucose 102, BUN 11, creatinine 1.47.  Chest x-ray:  No effusion, no infiltrate.  ASSESSMENT/PLAN:  For this 60 year old gentleman with history of chronic obstructive pulmonary disease and tobacco abuse. 1. Acute respiratory failure secondary to chronic obstructive     pulmonary disease exacerbation, much improved with BiPAP treatment.  The patient does not need endotracheal intubation at this time.  We     are going to continue the patient on BiPAP overnight and monitor     him closely in the step-down unit.  In the morning we  are going to     wean him off the BiPAP and check ABGs as well as his chest x-ray.     We are going to initiate the patient on intravenous steroids and     bronchodilators including albuterol and Atrovent.  I am also going     to also initiate the patient on broad spectrum antibiotic coverage     including resistant microorganisms given his recent history of     hospitalization, as well as the fact that now he is reporting     productive cough.  If his chest x-ray remains clear, and if he does     not spike temperature, and the suspicion of infection is low, we     are going to do discontinue his antibiotics. 2. Chronic renal insufficiency with base creatinine 1.26-     1.9.  We  are going to follow the patient's BUN and creatinine and     monitor his urine output.  At this point the patient does not need     intravenous hydration as he is expressing the fact that he is     hungry and would like to be initiated on diet. 3. History of polysubstance abuse including tobacco, as well as     cocaine.  The patient's urine drug screen is positive for cocaine     even though the patient claims that he has not been using cocaine     recently.  He was also given nicotine patch, which he also uses at     home. 4. Gastrointestinal and deep venous thrombosis prophylaxis.  TOTAL CRITICAL CARE TIME:  Sixty minutes.     Gerald Masker, MD     BM/MEDQ  D:  04/23/2010  T:  04/23/2010  Job:  [id number]  Electronically Signed by Billy Fischer MD on 07/05/2010 11:51:39 AM

## 2010-07-06 LAB — COMPREHENSIVE METABOLIC PANEL
ALT: 54 U/L — ABNORMAL HIGH (ref 0–53)
AST: 89 U/L — ABNORMAL HIGH (ref 0–37)
Albumin: 3.5 g/dL (ref 3.5–5.2)
Alkaline Phosphatase: 56 U/L (ref 39–117)
CO2: 26 mEq/L (ref 19–32)
Chloride: 110 mEq/L (ref 96–112)
GFR calc Af Amer: >60 mL/min (ref >60)
GFR calc non Af Amer: 52 mL/min — ABNORMAL LOW (ref >60)
Potassium: 4.6 mEq/L (ref 3.5–5.1)
Sodium: 142 mEq/L (ref 135–145)
Total Bilirubin: 0.6 mg/dL (ref 0.3–1.2)

## 2010-07-06 LAB — GLUCOSE, CAPILLARY
Glucose-Capillary: 125 mg/dL — ABNORMAL HIGH (ref 70–99)
Glucose-Capillary: 130 mg/dL — ABNORMAL HIGH (ref 70–99)
Glucose-Capillary: 133 mg/dL — ABNORMAL HIGH (ref 70–99)
Glucose-Capillary: 187 mg/dL — ABNORMAL HIGH (ref 70–99)
Glucose-Capillary: 214 mg/dL — ABNORMAL HIGH (ref 70–99)
Glucose-Capillary: 95 mg/dL (ref 70–99)
Glucose-Capillary: 95 mg/dL (ref 70–99)

## 2010-07-06 LAB — BASIC METABOLIC PANEL
BUN: 15 mg/dL (ref 6–23)
CO2: 26 mEq/L (ref 19–32)
Chloride: 111 mEq/L (ref 96–112)
Potassium: 3.9 mEq/L (ref 3.5–5.1)

## 2010-07-06 LAB — BASIC METABOLIC PANEL WITH GFR
BUN: 11 mg/dL (ref 6–23)
CO2: 24 meq/L (ref 19–32)
Calcium: 8 mg/dL — ABNORMAL LOW (ref 8.4–10.5)
Chloride: 115 meq/L — ABNORMAL HIGH (ref 96–112)
Creatinine, Ser: 1.47 mg/dL (ref 0.4–1.5)
GFR calc non Af Amer: 49 mL/min — ABNORMAL LOW
Glucose, Bld: 102 mg/dL — ABNORMAL HIGH (ref 70–99)
Potassium: 3.7 meq/L (ref 3.5–5.1)
Sodium: 144 meq/L (ref 135–145)

## 2010-07-06 LAB — CARDIAC PANEL(CRET KIN+CKTOT+MB+TROPI)
CK, MB: 36.4 ng/mL (ref 0.3–4.0)
CK, MB: 76.5 ng/mL (ref 0.3–4.0)
Relative Index: 2.3 (ref 0.0–2.5)
Total CK: 1555 U/L — ABNORMAL HIGH (ref 7–232)
Total CK: 2762 U/L — ABNORMAL HIGH (ref 7–232)

## 2010-07-06 LAB — CBC
HCT: 36 % — ABNORMAL LOW (ref 39.0–52.0)
Hemoglobin: 14.7 g/dL (ref 13.0–17.0)
MCH: 31.6 pg (ref 26.0–34.0)
MCH: 31.7 pg (ref 26.0–34.0)
MCV: 93.5 fL (ref 78.0–100.0)
Platelets: 233 10*3/uL (ref 150–400)
RBC: 4.65 MIL/uL (ref 4.22–5.81)
RDW: 14.3 % (ref 11.5–15.5)
WBC: 8.7 10*3/uL (ref 4.0–10.5)
WBC: 9.5 10*3/uL (ref 4.0–10.5)

## 2010-07-06 LAB — TSH: TSH: 0.744 u[IU]/mL (ref 0.350–4.500)

## 2010-07-06 LAB — LIPID PANEL
Triglycerides: 42 mg/dL (ref <150)
VLDL: 8 mg/dL (ref 0–40)

## 2010-07-06 LAB — CK: Total CK: 1960 U/L — ABNORMAL HIGH (ref 7–232)

## 2010-07-06 LAB — MRSA PCR SCREENING: MRSA by PCR: NEGATIVE

## 2010-07-06 LAB — MAGNESIUM: Magnesium: 2 mg/dL (ref 1.5–2.5)

## 2010-07-07 LAB — BLOOD GAS, ARTERIAL
Acid-base deficit: 2.8 mmol/L — ABNORMAL HIGH (ref 0.0–2.0)
Bicarbonate: 22.7 meq/L (ref 20.0–24.0)
Drawn by: 317771
FIO2: 0.21 %
O2 Saturation: 98.4 %
Patient temperature: 98.7
TCO2: 20.1 mmol/L (ref 0–100)
pCO2 arterial: 44.3 mmHg (ref 35.0–45.0)
pH, Arterial: 7.331 — ABNORMAL LOW (ref 7.350–7.450)
pO2, Arterial: 125 mmHg — ABNORMAL HIGH (ref 80.0–100.0)

## 2010-07-07 LAB — DIFFERENTIAL
Basophils Absolute: 0 10*3/uL (ref 0.0–0.1)
Basophils Relative: 0 % (ref 0–1)
Monocytes Absolute: 0 10*3/uL — ABNORMAL LOW (ref 0.1–1.0)
Neutro Abs: 6 10*3/uL (ref 1.7–7.7)
Neutrophils Relative %: 97 % — ABNORMAL HIGH (ref 43–77)

## 2010-07-07 LAB — CBC
HCT: 44.9 % (ref 39.0–52.0)
Hemoglobin: 15 g/dL (ref 13.0–17.0)
MCH: 31.9 pg (ref 26.0–34.0)
MCHC: 33.4 g/dL (ref 30.0–36.0)
MCV: 95.4 fL (ref 78.0–100.0)
Platelets: 230 K/uL (ref 150–400)
RBC: 4.71 MIL/uL (ref 4.22–5.81)
RDW: 14.5 % (ref 11.5–15.5)
WBC: 6.2 K/uL (ref 4.0–10.5)

## 2010-07-07 LAB — POCT CARDIAC MARKERS
CKMB, poc: 11.6 ng/mL (ref 1.0–8.0)
Myoglobin, poc: 311 ng/mL (ref 12–200)
Troponin i, poc: <0.05 ng/mL (ref 0.00–0.09)

## 2010-07-07 LAB — BASIC METABOLIC PANEL WITH GFR
BUN: 11 mg/dL (ref 6–23)
CO2: 24 meq/L (ref 19–32)
Calcium: 7.8 mg/dL — ABNORMAL LOW (ref 8.4–10.5)
Chloride: 103 meq/L (ref 96–112)
Creatinine, Ser: 1.73 mg/dL — ABNORMAL HIGH (ref 0.4–1.5)
GFR calc non Af Amer: 41 mL/min — ABNORMAL LOW
Glucose, Bld: 269 mg/dL — ABNORMAL HIGH (ref 70–99)
Potassium: 3.8 meq/L (ref 3.5–5.1)
Sodium: 138 meq/L (ref 135–145)

## 2010-07-07 LAB — GLUCOSE, CAPILLARY: Glucose-Capillary: 181 mg/dL — ABNORMAL HIGH (ref 70–99)

## 2010-07-09 LAB — CBC
Hemoglobin: 15.6 g/dL (ref 13.0–17.0)
MCH: 33.1 pg (ref 26.0–34.0)
RBC: 4.72 MIL/uL (ref 4.22–5.81)

## 2010-07-09 LAB — DIFFERENTIAL
Lymphocytes Relative: 38 % (ref 12–46)
Lymphs Abs: 1.8 10*3/uL (ref 0.7–4.0)
Monocytes Relative: 8 % (ref 3–12)
Neutro Abs: 1.9 10*3/uL (ref 1.7–7.7)
Neutrophils Relative %: 39 % — ABNORMAL LOW (ref 43–77)

## 2010-07-09 LAB — POCT CARDIAC MARKERS
CKMB, poc: 2.4 ng/mL (ref 1.0–8.0)
Myoglobin, poc: 104 ng/mL (ref 12–200)

## 2010-07-09 LAB — POCT I-STAT, CHEM 8
BUN: 15 mg/dL (ref 6–23)
Chloride: 108 mEq/L (ref 96–112)
Creatinine, Ser: 1.9 mg/dL — ABNORMAL HIGH (ref 0.4–1.5)
Glucose, Bld: 100 mg/dL — ABNORMAL HIGH (ref 70–99)
Potassium: 4.3 mEq/L (ref 3.5–5.1)

## 2010-07-12 LAB — BASIC METABOLIC PANEL
BUN: 21 mg/dL (ref 6–23)
Calcium: 8.6 mg/dL (ref 8.4–10.5)
Creatinine, Ser: 1.73 mg/dL — ABNORMAL HIGH (ref 0.4–1.5)
GFR calc non Af Amer: 41 mL/min — ABNORMAL LOW (ref >60)
Glucose, Bld: 102 mg/dL — ABNORMAL HIGH (ref 70–99)

## 2010-07-12 LAB — CBC
HCT: 45.5 % (ref 39.0–52.0)
Platelets: 202 10*3/uL (ref 150–400)
RDW: 13.6 % (ref 11.5–15.5)

## 2010-07-12 LAB — DIFFERENTIAL
Basophils Absolute: 0 10*3/uL (ref 0.0–0.1)
Eosinophils Relative: 10 % — ABNORMAL HIGH (ref 0–5)
Lymphocytes Relative: 38 % (ref 12–46)
Neutro Abs: 2.2 10*3/uL (ref 1.7–7.7)
Neutrophils Relative %: 44 % (ref 43–77)

## 2010-07-12 LAB — POCT CARDIAC MARKERS: CKMB, poc: 1.7 ng/mL (ref 1.0–8.0)

## 2010-07-12 LAB — BRAIN NATRIURETIC PEPTIDE: Pro B Natriuretic peptide (BNP): <30 pg/mL (ref 0.0–100.0)

## 2010-07-31 LAB — DIFFERENTIAL
Basophils Absolute: 0 10*3/uL (ref 0.0–0.1)
Basophils Relative: 1 % (ref 0–1)
Eosinophils Absolute: 0.5 10*3/uL (ref 0.0–0.7)
Eosinophils Relative: 12 % — ABNORMAL HIGH (ref 0–5)
Lymphocytes Relative: 40 % (ref 12–46)

## 2010-07-31 LAB — CBC
HCT: 42.6 % (ref 39.0–52.0)
MCV: 96.3 fL (ref 78.0–100.0)
Platelets: 191 10*3/uL (ref 150–400)
RDW: 13.3 % (ref 11.5–15.5)

## 2010-07-31 LAB — BASIC METABOLIC PANEL
BUN: 22 mg/dL (ref 6–23)
GFR calc non Af Amer: 44 mL/min — ABNORMAL LOW (ref >60)
Glucose, Bld: 100 mg/dL — ABNORMAL HIGH (ref 70–99)
Potassium: 4.1 mEq/L (ref 3.5–5.1)

## 2010-07-31 LAB — POCT CARDIAC MARKERS

## 2010-09-10 NOTE — Discharge Summary (Signed)
NAME:  Gerald Williams, Gerald Williams                          ACCOUNT NO.:  [id number]   MEDICAL RECORD NO.:  [id number]                   PATIENT TYPE:  INP   LOCATION:  3030                                 FACILITY:  [facility]   PHYSICIAN:  Elliot Cousin, M.D.                 DATE OF BIRTH:  [birth year]   DATE OF ADMISSION:  06/20/2003  DATE OF DISCHARGE:  06/23/2003                                 DISCHARGE SUMMARY   DISCHARGE DIAGNOSES:  1. Acute chronic obstructive pulmonary disease exacerbation.  2. Crack cocaine addiction.  3. Tobacco use.  4. Chest pain.  5. Chronic leukopenia.  6. Human immunodeficiency virus, negative, during this admission.  7. Transient hypokalemia.   DISCHARGE MEDICATIONS:  1. Advair Diskus 1 inhalation b.i.d.  2. Combivent inhaler 2 puffs 2-4 times daily as needed.  3. Azithromycin 250 mg daily for 3 additional days.  4. Aspirin 81 mg daily.  5. Pepcid 20 mg daily.  6. Tussionex cough syrup b.i.d. p.r.n.   DISCHARGE DISPOSITION:  The patient was discharged to home in improved and  stable condition on June 23, 2003.  The patient was given information  for the [facility] and was advised to follow up in 2-3 days for  application.  From the time of application, the patient was advised to see a  physician at [facility] in 1-2 weeks.  The patient was also given  information on Social Services and services available to help with cocaine  abuse.   PROCEDURES PERFORMED:  1. CT scan of the chest on June 20, 2003.  The results revealed no CT     evidence of acute pulmonary embolism or other significant abnormality.     Minimal dependent bibasilar atelectasis. Mild pulmonary emphysema.  2. Limited CT scan of the lower extremities with contrast:  No evidence of     acute deep venous thrombosis(DVT).   HISTORY OF PRESENT ILLNESS:  Mr. Plotkin is a 60 year old African-American  male with no significant past medical history who presented to the emergency  department on June 20, 2003 with a 2-day history of shortness of breath,  malaise, chills, productive cough which was intermittently dry and  productive and a poor appetite.  The patient also had chest tightness and  transient chest pain, particularly when cough.  He denied any other  associated symptoms related to the chest pain.  He did have an associated  headache and chills, but no fever. The patient smokes a pack of cigarettes  per day and has been doing so for over 30 years.  He also smokes crack  cocaine several times weekly, although he denied any recent crack cocaine  use.   When the patient was evaluated in the emergency department he had a  temperature of 101. His respiratory rate ranged between 43 and 45.  His  oxygen saturation on room air was 86%.  His chest x-ray revealed  no acute  disease, but there was some evidence of COPD.  The patient was, therefore,  admitted for further evaluation and management.   HOSPITAL COURSE BY PROBLEMS:  Problem #1:  COPD EXACERBATION.  The initial  management started in the emergency department when the patient was given  Atrovent and albuterol nebulizers x2.  He was also given Rocephin 1 gm IV  and azithromycin 500 mg IV x1.  He was placed on 2 liters of nasal cannula  oxygen.  Blood cultures were ordered from the emergency department prior to  antibiotic therapy.  The patient was admitted to a step-down bed secondary  to the hypoxia and also secondary to the respiratory distress.  An ABG on  room air revealed a pH of 7.4, pCO2 of 39, and a pO2 of 58.  He was given  symptomatic treatment with Robitussin DM t.i.d., Tessalon Perles b.i.d., and  Tussionex 1 tsp q.12h. as needed.  When he was placed on 2 liters of nasal  cannula oxygen his oxygenation saturations improved from 93 to 98%.  A  sputum was collected for routine C&S and also to check for PCP.  The sputum  culture did not grow out any pathologic bacteria, the sputum culture was   positive for only oral pharyngeal flora.  A specimen for PCP was never  obtained.  Although the patient stated that he had not used or smoked crack  cocaine in 3 weeks, a urine drug screen was ordered, nevertheless.  The  urine drug screen was positive for cocaine.  When confronted with the  positive drug screen, the patient stated that he had been using and smoking  crack cocaine up until the time of hospital admission.  Over the hospital  course the patient became less symptomatic.  The nasal cannula oxygen was  titrated off. He is currently oxygenating between 93 and 95% on room air.  The albuterol and Atrovent nebulizers have been discontinued.  He was placed  on Combivent MDI 2 puffs b.i.d. to q.i.d..  Advair Diskus at 100/50 q.12h.  was also added to the regimen.  The patient was given a nicotine patch for  prevention of nicotine withdrawal.  He was advised to stop smoking crack  cocaine and to stop smoking cigarettes.   A CT scan of the chest was ordered to rule out pulmonary embolism and any  other intrathoracic abnormalities. The CT scan of the chest was negative for  PE; however, it did show mild early changes of emphysema.  The patient was  informed of the result of the CT scan of the chest and again, admonished to  stop smoking.  He received Rocephin and azithromycin intravenously during  the 3 day hospital course.  He will go home on azithromycin 250 mg daily for  an additional 3 days.  Blood cultures were also ordered during the hospital  course and were completely negative.   Problem #2:  CHEST PAIN.  The patient complained of chest pain on admission.  He was treated with as needed hydrocodone.  Cardiac enzymes were ordered and  were completely negative during the hospitalization.  The EKG revealed  nonspecific T wave abnormalities.  After 24 hours of hospitalization the chest pain resolved.  As mentioned above, the CT scan of the chest was  negative for PE.  The chest pain  was probably secondary to the COPD  exacerbation and/or the cocaine abuse.  He was treated with a baby aspirin  at 81 mg daily.  Again, he was admonished to stop smoking and to stop crack  cocaine.   Problem #3:  LEUKOPENIA.  The patient's white blood cell count was mildly  below the normal range.  His WBC ranged between 3.0 and 3.4.  In general the  absolute neutrophil count ranged between 2 and 2.2 and the absolute  lymphocyte count ranged between 0.7 and 0.8.  An HIV antibody screen was  ordered for evaluation.  The patient gave consent.  The HIV antibody was  negative/nonreactive.  A peripheral blood smear was sent for the pathologist  to read.  The results were an unremarkable morphology.  The patient probably  has a low-normal variant of his white blood cell count.  No further workup  is warranted at this time.   Problem #4:  CRACK COCAINE ABUSE.  The patient initially denied crack  cocaine use in the 3 weeks prior to admission.  However, a urine drug screen  was positive for cocaine.  The patient later admitted that he had been using  and smoking circumzygomatic up until the time of hospital admission.  The  patient's COPD exacerbation was probably secondary not only to the tobacco  abuse, but also to the cocaine abuse.  The case manager/social worker was  consulted.  The patient was given information on outpatient services for  drug rehabilitation.  The patient is very interested in stopping crack  cocaine use.  He said that he will follow up with the social services at  hospital discharge.   The patient was given on the [facility].  He is familiar with the  [facility].  He was advised to follow up at the [facility]  in 2-3 days for intake.  After he becomes eligible he will need to follow up  with a primary care physician, there, in 1-2 weeks.  The patient was also  given assistance with medications at the time of hospital discharge.  The  case  manager assisted with getting medications for azithromycin, Pepcid, and  Tussionex  cough syrup.  The patient was also treated for hypokalemia during the  hospital course.  His potassium on admission was 3.8; however, when he was  given IV fluids it dropped to 3.2.  He repleted, by mouth, with potassium  chloride.  A magnesium level was checked and was within normal limits at  1.7.                                                Elliot Cousin, M.D.    DF/MEDQ  D:  06/23/2003  T:  06/23/2003  Job:  [id number]   cc:   [facility]

## 2010-09-10 NOTE — Discharge Summary (Signed)
NAME:  Gerald Williams, Gerald Williams NO.:  [id number]   MEDICAL RECORD NO.:  [id number]          PATIENT TYPE:  INP   LOCATION:  2015                         FACILITY:  [facility]   PHYSICIAN:  Pricilla Riffle, M.D.    DATE OF BIRTH:  1950-07-17   DATE OF ADMISSION:  03/21/2005  DATE OF DISCHARGE:  03/22/2005                           DISCHARGE SUMMARY - REFERRING   SUMMARY OF HISTORY:  Mr. Congrove is a 60 year old male, who while walking to  the bus stop developed some dizziness, light-headedness associated with  shortness of breath and left-sided chest discomfort.  The bus took him to a  fire station where he received four baby aspirins, and his symptoms resolved  on its own, and he presented to the emergency room.   PAST MEDICAL HISTORY:  Notable for crack cocaine, which he usually uses one  time every two weeks.  Last time he states that he used was three weeks ago,  and has been doing so for eight years.  COPD, tobacco use, and chronic  leukopenia.   PRIMARY CARE PHYSICIAN:  [facility].   LABORATORY DATA:  Urine drug screen was positive for opiates and cocaine.  TSH was 1.114, fasting lipids showed a total cholesterol of 141,  triglycerides 73, HDL 52, LDL 74, CK-MBs and troponins were negative x2 for  myocardial infarction.  Sodium was 141, potassium 4.2, BUN 21, creatinine  1.5, normal LFTs.  PT 12.5, PTT 26, H&H 16.1 and 46.7, normal indices.  Platelets 226, WBC 6.6.  EKG showed sinus rhythm with a ventricular rate of  90, normal axis, early re-pull, short PR interval.   HOSPITAL COURSE:  Mr. Hanssen was admitted to [facility] and placed  on subcu Lovenox.  Overnight, he continued to have left-sided shoulder  discomfort, which was reproducible on movement.  Enzymes and EKGs were  negative for myocardial infarction.  Dr. Diona Browner arranged for a stress  Myoview, which was performed.  During the test, he complained of some  shortness of breath.  Imaging revealed an  EF of 50% with normal wall motion.  It was noted that the patient did not achieve maximum heart rate, but it did  not show specific ischemia.  Tobacco cessation consult was also performed.  Given the negative imaging, it was felt that he could being discharged home.   DISCHARGE DIAGNOSES:  1.  Chest discomfort probably musculoskeletal in etiology.  2.  Drug abuse.   DISPOSITION:  He was given permission to return to work on March 23, 2005.  Maintain low-sat, fat, and cholesterol diet.  His activities were not  restricted.  He was asked to arrange follow-up with his primary care  physician at [facility] within the next 10 days.  He advised no tobacco  products, smoking, or drug use.      Joellyn Rued, P.A. [facility]    ______________________________  Pricilla Riffle, M.D.    EW/MEDQ  D:  05/02/2005  T:  05/02/2005  Job:  [id number]   cc:   [facility]  [address]  [place], [place] [address]

## 2010-09-10 NOTE — H&P (Signed)
NAME:  Gerald Williams, Gerald Williams NO.:  [id number]   MEDICAL RECORD NO.:  [id number]          PATIENT TYPE:  EMS   LOCATION:  MAJO                         FACILITY:  [facility]   PHYSICIAN:  Pricilla Riffle, M.D.    DATE OF BIRTH:  09-11-1950   DATE OF ADMISSION:  03/21/2005  DATE OF DISCHARGE:                                HISTORY & PHYSICAL   PATIENT PROFILE:  A 60 year old African-American male who presents to the ED  following an episode of chest pain.   PROBLEM LIST:  1.  Chest pain.  2.  Crack cocaine addiction, last used 3 weeks ago. Has been using once      every 2 weeks for the past 8 years.  3.  Ongoing tobacco abuse, one pack per day for approximately 40 years.  4.  Chronic obstructive pulmonary disease.  5.  Chronic leukopenia.   HISTORY OF PRESENT ILLNESS:  A 60 year old African-American male with a  prior history of chest pain dating back to February 2005, at which time he  was admitted following crack cocaine use and had a negative chest CT. The  patient never underwent an ischemic evaluation. He reports that last night  he was lying down watching Sunday Night Football when he developed left  shoulder pain that was transient in nature and was improved with  repositioning and was therefore able to fall off to sleep. This morning he  had given plasma and walked approximately four blocks when he missed his bus  and had to walk quickly for the remaining four blocks. When he arrived at  the bus stop, he was lightheaded and dizzy and developed 8/10 left chest  discomfort associated with shortness of breath. He denies any nausea or  diaphoresis. When his bus arrived approximately 15 minutes later he got on  the bus and informed the bus driver he was having chest pain and shortness  of breath. The bus driver then drove him to the closest fire station which  was about 10 minutes away. At the fire station he was placed on oxygen  without relief of chest pain but some  improvement in shortness of breath.  EMS was called and on their arrival approximately 20 minutes later, he was  treated with four baby aspirin without significant relief in discomfort. He  was then taken into the [facility] ED where shortly after arrival he was  pain free. We were called to evaluate the patient secondary to nonspecific  ST changes on his EKG including T wave inversions lead I and V2 and coving  of the ST in lead V3. The patient is currently pain free and denies any  shortness of breath. He reports that he last used crack cocaine about 3  weeks ago and says that he has quit for good. He previously was using once  every 2 weeks when he got paid.   ALLERGIES:  No known drug allergies.   HOME MEDICATIONS:  None.   FAMILY HISTORY:  His mother died of complications of diabetes at age 59.  Father died of an MI at 35. He  had 14 brothers and sisters and only 7 are  currently living. In the ones that are deceased, he believes there were  seizures, EtOH abuse, and possibly CAD.   SOCIAL HISTORY:  He lives in [place] by himself. He is divorced with four  children - one son and three daughters. 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  He continues to smoke one pack of cigarettes a day and has done so for the  past approximately 40 years. He denies any alcohol use now but did use  previously. He uses crack cocaine once every 2 weeks and last used 3 weeks  ago. He has been doing that for 8 years.   REVIEW OF SYSTEMS:  Positive for chest pain, shortness of breath, left  shoulder pain last night. All other systems reviewed and are negative.   PHYSICAL EXAMINATION:  VITAL SIGNS:  Blood pressure is 99/66, heart rate 92,  respirations 20, pulse oximetry is 98% on room air. He is afebrile.  GENERAL:  Pleasant African-American male in no acute distress. Awake, alert,  and oriented x3.  NECK:  Normal carotid upstrokes. No bruits or JVD.  LUNGS:  Respirations regular and unlabored, clear  to auscultation.  CARDIAC:  Regular S1, S2. No S3, S4, or murmurs.  ABDOMEN:  Round, soft, nontender, nondistended. Bowel sounds present x1.  EXTREMITIES:  Warm, dry. No clubbing, cyanosis, or edema. Dorsalis pedis and  posterior tibial pulses are 2+ and equal bilaterally.  HEENT:  Atraumatic, normocephalic. He wears glasses.   His chest x-ray is pending. EKG shows a heart rate of 86 in sinus rhythm  with less than 1 mm ST segment elevation in V2 and 1-2 mm ST elevation in V3  with coving of the ST segment. These changes were present in 2003 and  perhaps now his coving is a little more convex. All of his lab work is  currently pending.   ASSESSMENT AND PLAN:  1.  Chest pain. He is currently pain free and had an episode of chest      discomfort following walking at a rapid pace associated with shortness      of breath. Will plan to admit him, cycle cardiac markers, and follow up      EKG. Will add aspirin, Lovenox, nitrate. Will check lipids and LFTs, as      well as TSH. Provided that cardiac markers were negative we plan for      inpatient functional study in the a.m. If cardiac markers are positive,      plan for cardiac catheterization.  2.  Crack cocaine abuse, cessation advised. The patient states that he has      quit for good.  3.  Ongoing tobacco abuse; smoking cessation strongly advised. We will have      to obtain a smoking cessation consult.  4.  Lipid status, currently unknown. Will check lipid profile.  5.  Chronic obstructive pulmonary disease. The patient is not currently      wheezing. Does not use any inhalers at home. Smoking cessation advised.      Ok Anis, NP    ______________________________  Pricilla Riffle, M.D.    CRB/MEDQ  D:  03/21/2005  T:  03/21/2005  Job:  [id number]

## 2010-09-10 NOTE — H&P (Signed)
NAME:  Gerald Williams, Gerald Williams NO.:  [id number]   MEDICAL RECORD NO.:  [id number]                   PATIENT TYPE:  EMS   LOCATION:  [facility]                                 FACILITY:  [facility]   PHYSICIAN:  Elliot Cousin, M.D.                 DATE OF BIRTH:  [birth year]   DATE OF ADMISSION:  06/20/2003  DATE OF DISCHARGE:                                HISTORY & PHYSICAL   CHIEF COMPLAINT:  Shortness of breath, cough, and chest pain.   HISTORY OF PRESENT ILLNESS:  Gerald Williams is a 60 year old African-American  man with no significant past medical history; however, he does admit to  crack cocaine use.  He presents to the emergency department with a 2-day  history of shortness of breath, malaise, chills, productive cough,  intermittent with a dry cough; poor appetite; and malaise.  He began getting  some upper chest pain yesterday.  The chest pain started at work when he was  cooking.  The patient is a cook.  He says that the chest pain came with  activity and it has been constant for the past few hours. He rates the pain  as a 5/10.  It does not have a pleuritic component and he denies any chest  wall pain when coughing.  The patient has had an associated headache and  chills, but no fever.  He denies any radiation of the pain, diaphoresis or  nausea.  He denies any upper respiratory infection symptoms such as earache,  sore throat or runny nose.  He denies any pleurisy.  He has had no nausea,  vomiting, or diarrhea.  The patient smokes a pack of cigarettes per day and  has been doing so since 60 years of age.  He stopped smoking crack cocaine  approximately 3 weeks ago after binging.  He states that he has lost about  25 pounds in the past 2 weeks because he has been working nonstop and has  also been binging on crack cocaine up until a few weeks ago.  He denies any  night sweats.  He denies any known exposure to TB or pneumonia.  The patient  is sexually active and he  says that he uses a condom. He denies homosexual  sex.   When the patient was evaluated in the emergency department he was found to  have a temperature of 101.  His respiratory rate ranged between 43 and 52.  His oxygen saturation on room air was 86% .  His chest x-ray revealed no  acute disease, but did have evidence of COPD.  The patient will, therefore,  be admitted for further evaluation and management.   PAST MEDICAL/SURGICAL HISTORY:  The patient has no past medical history and  no past surgical history.   MEDICATIONS:  Tylenol PM q.h.s. p.r.n.   ALLERGIES:  No known drug allergies.   SOCIAL HISTORY:  The patient lives  in a boarding house in [place], [place]  [place].  He lives alone.  He is divorced.  He has 4 children.  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 He can read and write. He smoked crack cocaine several  times weekly up until approximately 3 weeks ago. He denies any intravenous  drug use. He stopped drinking alcohol years ago.  He says that he uses a  condom when he has sex.  He denies any homosexual sexual contact.   FAMILY HISTORY:  His father died of a myocardial infarction at 66 years of  age.  His mother died of colon cancer at 36 years of age.  On of his sisters  has asthma, she is 54 years of age.  Another sister who is 71 has diabetes  mellitus and kidney failure.  There has been no known history of blood  clots.   REVIEW OF SYSTEMS:  As above in the history of present illness.  In  addition, the patient's review of systems is positive for a productive cough  with white sputum, positive for generalized weakness, positive for weight  loss, approximately 25 pounds in 2-4 weeks.  Generalized malaise and poor  appetite.   His review of systems is negative for night sweats, rash, difficulty  swallowing, visual changes, difficulty hearing, dysphagia, negative for  nausea, vomiting, diarrhea; negative for melena or hematochezia; negative  for dysuria or hematuria;  negative for urinary hesitancy; negative for  swelling in his legs; negative for joint pain.  His review of systems is  also negative for blackouts.   PHYSICAL EXAMINATION:  VITAL SIGNS:  Temperature 101.1, blood pressure  112/57, pulse 83, respiratory rate 32.  Oxygen saturation on room air 86%,  repeated at 98% on 2 liters.  GENERAL:  In general the patient is a pleasant, alert, medium-framed, 60-  year-old African-American man who is currently sitting up in bed in no acute  distress at this moment.  HEENT:  Head is normocephalic, atraumatic.  Pupils are equal, round, and  reacted to light.  Extraocular movements are intact.  Conjunctivae are  clear.  Sclerae are white.  Tympanic membranes are clear bilaterally.  Nasal  mucosa is moist, on drainage.  Oropharynx reveals fair-to-poor dentition.  Mucous membranes are mildly dry.  No posterior exudate or erythema.  NECK:  Supple.  No adenopathy, no thyromegaly, no bruits, no JVD.  LUNGS:  The patient is mildly tachypneic and mildly dyspneic when speaking.  He has a few expiratory wheezes heard primarily in the midlung bilaterally  and he has decreased breath sounds in the bases (this is status post  nebulizers x2).  HEART:  S1-S2 with no murmurs, rubs, or gallops.  ABDOMEN:  Positive bowel sounds, soft, nontender, nondistended.  No  hepatosplenomegaly.  GENITOURINARY AND RECTAL:  Deferred.  SKIN:  The patient has good skin turgor.  No rashes.  EXTREMITIES:  The patient has good range of motion of all of his  extremities.  He does have evidence of clubbing of his fingernails.  There  are no joint abnormalities.  Pedal pulses are 2+ bilaterally.  No pretibial  edema, no pedal edema.  NEUROLOGIC:  The patient is alert and oriented x3.  Cranial nerves II-XII  are intact.  Strength is 5/5 throughout. Sensation is intact to self-touch.   ADMISSION LABORATORIES:  Chest x-ray:  No acute disease, but changes consistent with COPD.  EKG:  Normal  sinus rhythm with nonspecific T wave  abnormalities; also some inverted T  waves in the septal and lateral leads.  WBC: 3.0, hemoglobin 14.1, hematocrit 41.0, MCV 91.8, platelets 179,  absolute neutrophil count 2.0, absolute lymphocyte count 0.7.  Sodium 138,  potassium 3.8, chloride 107, CO2 24, glucose 101, BUN 13, creatinine 1.3,  calcium 8.9, total protein 6.6, albumin 3.9, SGOT 28, SGPT 21, alkaline  phosphatase 75, total bilirubin 0.9.   ASSESSMENT:  1. Dyspnea, productive cough, tachypnea, hypoxia.  These changes are     consistent with chronic obstructive pulmonary disease exacerbation.     There is also concern for pulmonary embolism.  Will also consider     reactive airway disease from history of crack cocaine use.  This is     superimposed on chronic tobacco abuse.  2. Chest pain.  The chest pain does not appear to be cardiac in origin. It     may be secondary to the chronic coughing that the patient has had for the     past 2 days.  However, he does have a positive family history of heart     disease.  In addition his EKG is completely normal although it is     nonspecific.  He is currently chest pain free now.  3. Leukopenia.  The leukopenia could be secondary to the acute bronchitis.     Given his history of drug use we will check an human immunodeficiency     virus to rule out HIV disease.  4. 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  The patient is     certainly at risk for chronic obstructive pulmonary disease exacerbation.     He states that he stopped using crack cocaine approximately 3 weeks ago     because he felt that it was affecting his health.  5. Weight loss.  The patient states that he has lost approximately 25 pounds     over the past 2 weeks; however, this may be an over exaggeration.  The     patient does say that he has dropped at least 1 pant size.  He attributes     this weight loss to not eating, working too hard, and binging on crack      cocaine.  The patient has had no recent experiences with melena, bright     red blood per rectum, or prostate abnormalities.   PLAN:  1. The patient will be admitted to a step-down bed.  2. Treatment started in the emergency department when he was given Atrovent     and albuterol nebulizers x2.  He was also given Rocephin and azithromycin     IV.  He was also placed on 2 liters of nasal cannula oxygen.  3. Antibiotic treatment will continue with Rocephin 1 gm IV daily as well as     azithromycin 500 mg IV daily.  4. Symptomatic treatment with Robitussin DM and Tussionex.  Also albuterol     and Atrovent nebulizers q.4h.  5. We will start a baby aspirin at 81 mg daily and Protonix 40 mg daily.  6. We will continue oxygen therapy.  We will check an arterial blood gas on     room air for baseline.  7. We will check cardiac enzymes x1 and, if positive, will repeat.  8. We will check an HIV Aliza to rule out HIV disease. 9. We will check blood cultures and sputum cultures.  10.      We will check sputum for not only routine C&S but also for PCP.  11.      We will check a blood smear for interpretation of the leukopenia.  12.      We will check a CT scan of the chest with contrast to rule out PE.  13.      We will check a urine drug screen with the urinalysis.  14.      Nicotine patch if needed.                                                Elliot Cousin, M.D.    DF/MEDQ  D:  06/20/2003  T:  06/20/2003  Job:  [id number]

## 2010-10-01 ENCOUNTER — Emergency Department (HOSPITAL_COMMUNITY): Payer: Non-veteran care

## 2010-10-01 ENCOUNTER — Emergency Department (HOSPITAL_COMMUNITY)
Admission: EM | Admit: 2010-10-01 | Discharge: 2010-10-01 | Discharge status: Against Medical Advice | Payer: Non-veteran care | Attending: Emergency Medicine | Admitting: Emergency Medicine

## 2010-10-01 DIAGNOSIS — R059 Cough, unspecified: Secondary | ICD-10-CM | POA: Insufficient documentation

## 2010-10-01 DIAGNOSIS — J438 Other emphysema: Secondary | ICD-10-CM | POA: Insufficient documentation

## 2010-10-01 DIAGNOSIS — R079 Chest pain, unspecified: Secondary | ICD-10-CM | POA: Insufficient documentation

## 2010-10-01 DIAGNOSIS — R0609 Other forms of dyspnea: Secondary | ICD-10-CM | POA: Insufficient documentation

## 2010-10-01 DIAGNOSIS — R10819 Abdominal tenderness, unspecified site: Secondary | ICD-10-CM | POA: Insufficient documentation

## 2010-10-01 DIAGNOSIS — Z79899 Other long term (current) drug therapy: Secondary | ICD-10-CM | POA: Insufficient documentation

## 2010-10-01 DIAGNOSIS — K219 Gastro-esophageal reflux disease without esophagitis: Secondary | ICD-10-CM | POA: Insufficient documentation

## 2010-10-01 DIAGNOSIS — R0989 Other specified symptoms and signs involving the circulatory and respiratory systems: Secondary | ICD-10-CM | POA: Insufficient documentation

## 2010-10-01 DIAGNOSIS — R05 Cough: Secondary | ICD-10-CM | POA: Insufficient documentation

## 2010-10-01 DIAGNOSIS — R0602 Shortness of breath: Secondary | ICD-10-CM | POA: Insufficient documentation

## 2010-10-01 LAB — POCT I-STAT, CHEM 8
BUN: 27 mg/dL — ABNORMAL HIGH (ref 6–23)
Calcium, Ion: 1.19 mmol/L (ref 1.12–1.32)
Creatinine, Ser: 1.8 mg/dL — ABNORMAL HIGH (ref 0.4–1.5)
Glucose, Bld: 117 mg/dL — ABNORMAL HIGH (ref 70–99)
TCO2: 26 mmol/L (ref 0–100)

## 2010-10-01 LAB — CK TOTAL AND CKMB (NOT AT ARMC)
Relative Index: 1.2 (ref 0.0–2.5)
Total CK: 450 U/L — ABNORMAL HIGH (ref 7–232)
Total CK: 433 U/L — ABNORMAL HIGH (ref 7–232)

## 2010-10-01 LAB — CBC
Hemoglobin: 15.6 g/dL (ref 13.0–17.0)
MCH: 32.3 pg (ref 26.0–34.0)
MCHC: 35.8 g/dL (ref 30.0–36.0)
Platelets: 215 10*3/uL (ref 150–400)

## 2010-10-01 LAB — DIFFERENTIAL
Basophils Relative: 0 % (ref 0–1)
Eosinophils Absolute: 0.3 10*3/uL (ref 0.0–0.7)
Monocytes Absolute: 0.4 10*3/uL (ref 0.1–1.0)
Monocytes Relative: 8 % (ref 3–12)

## 2010-10-01 LAB — TROPONIN I: Troponin I: <0.3 ng/mL (ref <0.30)

## 2010-10-11 NOTE — Discharge Summary (Signed)
NAME:  Gerald Williams, BAND NO.:  [id number]  MEDICAL RECORD NO.:  [id number]  LOCATION:  [facility]                         FACILITY:  [facility]  PHYSICIAN:  Santiago Bumpers. Hensel, M.D.DATE OF BIRTH:  November 06, 1950  DATE OF ADMISSION:  10/01/2010 DATE OF DISCHARGE:  10/01/2010                              DISCHARGE SUMMARY   The patient left against medical advice.  [facility] [place]  CHIEF COMPLAINT:  This is a 60 year old male who presented with COPD and chest pain.  BRIEF HOSPITAL COURSE:  The patient presented with dyspnea and chest pain that started 2 days prior to arrival in the emergency department. His symptoms felt similar to previous episodes of COPD exacerbation and was triggered by a visitor who was smoking marijuana and crack cocaine in his house.  The patient had been using albuterol q.1 h. for worsening symptoms and had associated cough with clear sputum and chest pain located centrally radiating across his bilateral ribcage into his back, which was worse with deep inspirations.  He also had some exertional dyspnea.  Notably, he denied fevers and chills, headaches, nausea, dysuria, visual changes, dizziness, vertigo, and abdominal pain.  In the emergency department, the patient was treated initially with DuoNeb, Rocephin, IV Solu-Medrol, and an IV bolus, and Family Practice was consulted for admission of this patient for COPD exacerbation and chest pain rule out.  Notably, he was stable on room air at 90%.  Admission orders and bed request was placed for observation and cardiac rule out; however, the patient left against medical advice before transfer to the floor.  During admission workup, the patient became angry over an incident regarding the ordering of urine drug sample.  When UDS was to be collected, he handed the nurse a cup that appeared to contain water and when questioned, he became angry and defensive.  He stated that he  would like to leave the hospital as soon as possible.  When I discussed this with him, the patient became angry about the nurse's "attitude" and wanted to leave.  He would not reason with my offer to cancel UDS and to stay for treatment of his COPD and cardiac rule out.  Risks were discussed with the patient, and he signed AMA form.  He was stable on room air at the time of his departure.  DISCHARGE DIAGNOSES: 1. Chronic obstructive pulmonary disease with recurrent flares and     acute exacerbation. 2. Chronic kidney disease. 3. Chest pain, not otherwise specified. 4. Substance abuse with marijuana and cocaine. 5. Tobacco abuse. 6. Gastroesophageal reflux disease.  DISCHARGE MEDICATIONS:  The patient was not prescribed any additional treatment and will remain on his home medications, which are as follows: 1. Spiriva. 2. Combivent 2 puffs b.i.d. 3. Albuterol p.r.n.  LABORATORIES AND STUDIES: 1. The patient had two sets of cardiac enzymes with troponins less     than 0.30.  CK 450, 433, CK-MB 5.4, 5.3. 2. CBC:  White blood count 5.5, hemoglobin 15.6, hematocrit 43.6,     platelets 213. 3. Chest x-ray showing hyperaerated lung fields consistent with COPD     and no active lung disease. 4. Creatinine 1.8, sodium 140,  potassium 4.0. 5. EKG showing normal sinus rhythm and no ST or T-wave changes.  PHYSICAL EXAM:  VITAL SIGNS:  Temperature 98.5, pulse 66, respirations 23, BP 104/70, O2 90% on room air. GENERAL APPEARANCE:  No apparent distress and nonlabored respirations. HEENT:  Normocephalic, atraumatic.  PERRLA.  EOMI.  Shotty anterior lymphadenopathy.  Mucous membranes moist.  No oral pharyngeal lesions. CARDIOVASCULAR:  Regular rate and rhythm and no murmurs. LUNGS:  Diminished breath sounds throughout.  No wheezing and no retractions. ABDOMEN:  Soft.  Bowel sounds positive, nontender, and nondistended. EXTREMITIES:  Trace bilateral lower extremity edema. NEUROLOGIC:  Alert  and orient x3.  No focal motor or sensory deficits.   DISCHARGE INSTRUCTIONS:  The patient departed with instructions to seek emergency care if his chest pain worsens, if he is unable to catch his breath, or any other concerns.  The patient departed and was stable on room air.    ______________________________ Lloyd Huger, MD   ______________________________ Santiago Bumpers. Leveda Anna, M.D.    Cleotis Lema  D:  10/01/2010  T:  10/02/2010  Job:  [id number]  Electronically Signed by Lloyd Huger MD on 10/09/2010 10:20:45 PM Electronically Signed by Doralee Albino M.D. on 10/11/2010 09:36:07 AM

## 2010-10-29 ENCOUNTER — Emergency Department (HOSPITAL_COMMUNITY): Payer: Non-veteran care

## 2010-10-29 ENCOUNTER — Emergency Department (HOSPITAL_COMMUNITY)
Admission: EM | Admit: 2010-10-29 | Discharge: 2010-10-29 | Disposition: A | Discharge status: Home / Self Care | Payer: Non-veteran care | Attending: Emergency Medicine | Admitting: Emergency Medicine

## 2010-10-29 DIAGNOSIS — F1410 Cocaine abuse, uncomplicated: Secondary | ICD-10-CM | POA: Insufficient documentation

## 2010-10-29 DIAGNOSIS — F17200 Nicotine dependence, unspecified, uncomplicated: Secondary | ICD-10-CM | POA: Insufficient documentation

## 2010-10-29 DIAGNOSIS — J4489 Other specified chronic obstructive pulmonary disease: Secondary | ICD-10-CM | POA: Insufficient documentation

## 2010-10-29 DIAGNOSIS — K219 Gastro-esophageal reflux disease without esophagitis: Secondary | ICD-10-CM | POA: Insufficient documentation

## 2010-10-29 DIAGNOSIS — J449 Chronic obstructive pulmonary disease, unspecified: Secondary | ICD-10-CM | POA: Insufficient documentation

## 2011-02-01 LAB — CBC
HCT: 44.3
Platelets: 241
WBC: 4.7

## 2011-02-01 LAB — I-STAT 8, (EC8 V) (CONVERTED LAB)
Acid-base deficit: 2
BUN: 18
Chloride: 110
HCT: 44
Hemoglobin: 15
Operator id: 284251
Potassium: 3.7
Sodium: 142
pCO2, Ven: 56.3 — ABNORMAL HIGH

## 2011-02-01 LAB — POCT I-STAT CREATININE
Creatinine, Ser: 1.6 — ABNORMAL HIGH
Operator id: 284251

## 2011-02-01 LAB — DIFFERENTIAL
Eosinophils Relative: 8 — ABNORMAL HIGH
Lymphocytes Relative: 34
Lymphs Abs: 1.6
Neutro Abs: 2.3

## 2011-02-06 ENCOUNTER — Emergency Department (HOSPITAL_COMMUNITY): Payer: Non-veteran care

## 2011-02-06 ENCOUNTER — Inpatient Hospital Stay (HOSPITAL_COMMUNITY)
Admission: EM | Admit: 2011-02-06 | Discharge: 2011-02-08 | DRG: 190 | Disposition: A | Discharge status: Home / Self Care | Payer: Non-veteran care | Attending: Internal Medicine | Admitting: Internal Medicine

## 2011-02-06 DIAGNOSIS — J440 Chronic obstructive pulmonary disease with acute lower respiratory infection: Principal | ICD-10-CM | POA: Diagnosis present

## 2011-02-06 DIAGNOSIS — J9600 Acute respiratory failure, unspecified whether with hypoxia or hypercapnia: Secondary | ICD-10-CM | POA: Diagnosis present

## 2011-02-06 DIAGNOSIS — J209 Acute bronchitis, unspecified: Principal | ICD-10-CM | POA: Diagnosis present

## 2011-02-06 DIAGNOSIS — Z23 Encounter for immunization: Secondary | ICD-10-CM

## 2011-02-06 DIAGNOSIS — F1410 Cocaine abuse, uncomplicated: Secondary | ICD-10-CM | POA: Diagnosis present

## 2011-02-06 DIAGNOSIS — F1910 Other psychoactive substance abuse, uncomplicated: Secondary | ICD-10-CM | POA: Diagnosis present

## 2011-02-06 DIAGNOSIS — R0789 Other chest pain: Secondary | ICD-10-CM | POA: Diagnosis present

## 2011-02-06 LAB — CARDIAC PANEL(CRET KIN+CKTOT+MB+TROPI)
CK, MB: 17.5 ng/mL (ref 0.3–4.0)
CK, MB: 15.6 ng/mL (ref 0.3–4.0)
Relative Index: 2 (ref 0.0–2.5)
Total CK: 773 U/L — ABNORMAL HIGH (ref 7–232)
Troponin I: <0.3 ng/mL (ref <0.30)

## 2011-02-06 LAB — RAPID URINE DRUG SCREEN, HOSP PERFORMED
Amphetamines: NOT DETECTED
Benzodiazepines: NOT DETECTED
Opiates: NOT DETECTED
Tetrahydrocannabinol: NOT DETECTED

## 2011-02-06 LAB — DIFFERENTIAL
Basophils Absolute: 0.1 10*3/uL (ref 0.0–0.1)
Eosinophils Absolute: 0.6 10*3/uL (ref 0.0–0.7)
Eosinophils Relative: 10 % — ABNORMAL HIGH (ref 0–5)

## 2011-02-06 LAB — BASIC METABOLIC PANEL
Calcium: 8.7 mg/dL (ref 8.4–10.5)
Creatinine, Ser: 1.55 mg/dL — ABNORMAL HIGH (ref 0.50–1.35)
GFR calc non Af Amer: 47 mL/min — ABNORMAL LOW (ref >90)
Glucose, Bld: 130 mg/dL — ABNORMAL HIGH (ref 70–99)
Sodium: 141 mEq/L (ref 135–145)

## 2011-02-06 LAB — CBC
Platelets: 227 10*3/uL (ref 150–400)
RDW: 13.5 % (ref 11.5–15.5)
WBC: 5.4 10*3/uL (ref 4.0–10.5)

## 2011-02-06 LAB — MRSA PCR SCREENING: MRSA by PCR: NEGATIVE

## 2011-02-06 LAB — POCT I-STAT TROPONIN I

## 2011-02-06 NOTE — H&P (Signed)
NAME:  LONG, BRIMAGE NO.:  [id number]  MEDICAL RECORD NO.:  [id number]  LOCATION:  2603                         FACILITY:  [facility]  PHYSICIAN:  Suella Grove, MD          DATE OF BIRTH:  25-Jan-1951  DATE OF ADMISSION:  02/06/2011 DATE OF DISCHARGE:                             HISTORY & PHYSICAL   CHIEF COMPLAINT:  Progressive dyspnea on exertion.  HISTORY OF PRESENT ILLNESS:  The patient is a 60 year old African American veteran, history of COPD not on home O2, who presents for evaluation of worsening dyspnea on exertion.  The patient reports that he was in his usual state of health until about approximately 5 days ago when he started to notice that he was becoming more and more short of breath.  He reports that this is how he feels when he has his COPD exacerbations, which normally require him to be admitted to the hospital for a couple of days with steroid treatment prior to discharge.  He reports that he continued to notice that shortness of breath/dyspnea on exertion was getting worse, to the point that he could not walk up the stairs to his second floor apartment without having a rest multiple times in between to catch his breath.  No fevers, chills, runny nose,  sore throat.  Denies any chest pain, palpitations, lightheadedness,  dizziness, nausea, or vomiting.  No new lower extremity edema.  Does  report that he is still smoking, although he has decreased from 2 packs  per day to 3 cigarettes a day over the past 6 months.  He also was noted  to have avidly smoked crack in the past, although now he reports that he  has not smoked any crack for at least the past 3 months.  Denies any recent  travel or sick contacts.  Had to call the EMS last night for worsening shortness of breath, and was given a DuoNeb treatment at home.  He reports that he felt better with the DuoNeb treatment and decided not to come in.  However, this morning, as things got much worse, he  called EMS again, at which point, he decided to come in for further evaluation.    On arrival to the emergency room, temperature 98.2, blood pressure 133/81, pulse 102, respirations 36, sating 100% on 6 L nasal cannula.  As his O2 was weaned down to 2 L nasal cannula, his O2 sats were reported to have been in the upper 80s.  Chest x-ray demonstrated no acute process with lung hyperinflation consistent with COPD.  EKG; normal sinus rhythm with no ischemic changes, possible right atrial enlargement.  He was given a DuoNeb treatment, after which, I evaluated the patient.  The patient continued to demonstrate increased respiratory rate in the 30s with mild costovertebral retraction, although the patient does report that things have been improving since he was put on the nebulizer treatment.  He was also given a dose of Solu- Medrol x1, admitted for further evaluation and treatment.  PAST MEDICAL HISTORY:  COPD:  Not on home O2.  The patient is not on any long-term inhalers, using DuoNeb as needed for treatment.  He  normally is seen at the [facility] for his medical care.  However, he frequents the [facility] system for acute exacerbations as he lives in [place].  PAST SURGICAL HISTORY:  None.  ALLERGIES:  No known drug allergies.  MEDICATIONS:  Albuterol and Atrovent as needed.  [facility]  FAMILY HISTORY:  No significant history for early MI, malignancy, or lung disease.  SOCIAL HISTORY:  The patient is single, has children.  Currently unemployed on pension from the [employer].  Smoking and illicits as above. Denies any EtOH.  REVIEW OF SYSTEMS:  GENERAL:  As per HPI and no weight loss.  HEENT:  No vision loss, runny nose, or sore throat.  PULMONARY:  As per HPI. CARDIOVASCULAR:  As per HPI.  GI:  No dysphagia or odynophagia.  No melena, hematochezia, hematemesis, or diarrhea.  No abdominal pain.  GU: No dysuria, hematuria, or increased  urinary frequency or urgency.  No history of prostate problems.  MUSCULOSKELETAL:  No acute joint aches or pains.  No muscle soreness.  SKIN:  No rashes or pruritus.  HEMATOLOGIC: No easy bruising or bleeding.  ENDOCRINE:  No heat or cold intolerance. PSYCHIATRIC:  No suicidal or homicidal ideation.  Does not currently feel depressed.  PHYSICAL EXAMINATION:  VITALS:  As above.  Notable for increased respiration with mild costovertebral retractions and paradoxical breathing. GENERAL:  Alert and oriented x3, in mild distress secondary to breathing, approximately 90 degrees upright with no tripoding. HEENT:  Sclerae are anicteric.  Pupils are equal and reactive to light and accommodation.  Extraocular motions are intact.  Oropharynx is unremarkable. NECK:  Soft.  No lymphadenopathy or thyromegaly. LUNGS:  Bilateral wheezes with prolonged end-expiratory phase.  No rales or rhonchi. HEART:  Regular rate and rhythm.  Normal S1 and S2.  No rubs, gallops, or murmurs. ABDOMEN:  Soft, nontender, mildly obese.  Positive bowel sounds, no masses. EXTREMITIES:  No cyanosis, clubbing, or edema.  2+ radial and dorsalis pedis pulses bilaterally. NEUROLOGIC:  Grossly intact.  LABORATORY DATA:   CBC:  White count 5.4, hemoglobin 15.5, platelet count 227.  Chemistry:  Sodium 141, potassium 3.9, chloride 109, bicarb 29, BUN 23, creatinine 1.55, glucose 130, calcium 8.7.  Troponin 0.00.  EKG, no ischemic changes as described above.  Chest x-ray, no acute process with hyperinflation of the lungs.  IMPRESSION AND PLAN: 1. Shortness of breath.  Most likely related to chronic obstructive     pulmonary disease exacerbation given history.  At this point in     time, given the respiratory rate and visible signs of labored      breathing, we will be monitoring the patient to determine whether he      needs to be in the step-down unit and require BiPAP or if he may      be at the turn of the corner with  an hour long DuoNeb.  The patient      has been provided Solu-Medrol x1 in the emergency room.  We will start p.o.     prednisone when the patient is admitted.  We will also start a     mometasone/formoterol inhaler with Spiriva to provide better long-     term symptom management.  I have chosen this combination because I     believe this is the combination that the [facility] will approve for his     medications.  In addition, for further evaluation of the trigger,  there is currently no known trigger, although perhaps with the     recent decrease in smoking, increased ciliary function, may be     increasing the mucus that he is experiencing.  However, given his     history, we will check U-tox.  Consider a D-dimer and CTA to rule     out for a possible pulmonary embolism.  If these are negative given     that approximately 25% of idiopathic COPD exacerbations were noted     to be related to a pulmonary embolism [facility].     Otherwise, O2 sats do remain between 88% and 92%.  2. History of tobacco abuse:  Nicotine patch.  We will     obtain smoking cessation consult.  3. Fluid/electrolytes/nutrition.  Hep-Lock IV, monitor electrolytes,     general diet.  4. Prophylaxis:  Lovenox 40 mg subcutaneous daily.  CODE STATUS:  Full code.  The patient would like his eldest daughter to be contacted for any possible problems in the event that the patient is not able to make his own medical decisions.  She can be contacted at  [phone].          ______________________________ Suella Grove, MD     AC/MEDQ  D:  02/06/2011  T:  02/06/2011  Job:  [id number]  Electronically Signed by Suella Grove MD on 02/06/2011 01:13:23 PM

## 2011-02-07 LAB — CBC
Hemoglobin: 14.2 g/dL (ref 13.0–17.0)
Platelets: 215 10*3/uL (ref 150–400)
RBC: 4.59 MIL/uL (ref 4.22–5.81)
WBC: 10.7 10*3/uL — ABNORMAL HIGH (ref 4.0–10.5)

## 2011-02-07 LAB — LIPID PANEL
Cholesterol: 175 mg/dL (ref 0–200)
HDL: 68 mg/dL (ref >39)
LDL Cholesterol: 96 mg/dL (ref 0–99)
Triglycerides: 54 mg/dL (ref <150)
VLDL: 11 mg/dL (ref 0–40)

## 2011-02-07 LAB — BASIC METABOLIC PANEL
CO2: 24 mEq/L (ref 19–32)
Chloride: 108 mEq/L (ref 96–112)
Glucose, Bld: 132 mg/dL — ABNORMAL HIGH (ref 70–99)
Potassium: 4.1 mEq/L (ref 3.5–5.1)
Sodium: 142 mEq/L (ref 135–145)

## 2011-02-07 LAB — DIFFERENTIAL
Basophils Absolute: 0 10*3/uL (ref 0.0–0.1)
Basophils Relative: 0 % (ref 0–1)
Eosinophils Absolute: 0 10*3/uL (ref 0.0–0.7)
Monocytes Relative: 4 % (ref 3–12)
Neutro Abs: 9.3 10*3/uL — ABNORMAL HIGH (ref 1.7–7.7)
Neutrophils Relative %: 87 % — ABNORMAL HIGH (ref 43–77)

## 2011-02-17 NOTE — Discharge Summary (Signed)
NAME:  Gerald Williams, HEPP NO.:  [id number]  MEDICAL RECORD NO.:  [id number]  LOCATION:  2041                         FACILITY:  [facility]  PHYSICIAN:  Osvaldo Shipper, MD     DATE OF BIRTH:  1950/12/15  DATE OF ADMISSION:  02/06/2011 DATE OF DISCHARGE:  02/08/2011                              DISCHARGE SUMMARY   PRIMARY CARE PROVIDERS:  At the [place].  No consultations obtained during this hospitalization.  IMAGING STUDIES DONE:  Include, a chest x-ray which showed emphysematous changes without any acute lung disease.  PERTINENT LABS:  Include, normal white count and admission creatinine was 1.55 and today it is 1.35.  CK was 797 with MB of 17.5, however, troponins were negative.  LDL was 96, HDL was 68.  Urine drug screen positive for cocaine, negative MRSA PCR.  DISCHARGE DIAGNOSES: 1. Acute chronic obstructive pulmonary disease exacerbation, improved. 2. Chest pain related to the above, ruled out for acute coronary    syndrome. 3. Cocaine abuse.  BRIEF HOSPITAL COURSE:  Briefly, this is a 60 year old African American male, who is a Cytogeneticist, was followed at the [place], who presented with shortness of breath and chest pain.  He was thought to have acute COPD exacerbation.  The patient was on steroid nebulizer treatments and he has improved.  He is saturating well on room air and is ready for discharge.  He also had chest pain.  CK was elevated, however, troponins have been negative.  He was positive for cocaine, so this could have caused this current symptom.  However, COPD could have also done this.  In any case, he has ruled out for ACS.  His EKG did not show any acute ischemic changes and he tells me that he had his stress testing done recently. He is chest pain-free currently.  He was counseled regarding his cocaine use.  He was quite adamant that he did not use it.  He might have carried cocaine for his colleague and friends, however, he denied using it  himself.  Rest of his medical issues are all stable.  He did request a prescription for Chantix, however, Chantix is a medication that requires close followup with other providers because of the issue of depression and suicidal ideations.  So, I have told him to talk to his [place] provider when he sees him in followup.  On the day of discharge, the patient is feeling well, keen on going home.  Breathing is back to normal and his baseline chest pain is suspected chest pain, he is completely pain free.  His vital signs are all stable.  He is saturating 93% on room air.  He did not talk or asking about why his hand becomes stiff at times.  I do not have a clear- cut answer.  His calcium level is normal.  Examination, otherwise revealed that the lungs are clear to auscultation bilaterally with no wheeze or rhonchi.  Cardiovascular, S1 and S2 is normal, regular.  No S3- S4 rubs murmurs or bruits.  Abdomen is soft, nontender, nondistended. Bowel sounds are present.  No masses or organomegaly is appreciated. Neurologically, he is alert, oriented x3.  No focal neurological deficits are  present. So, the patient is essentially stable for discharge.  ASSESSMENT/PLAN:  As per above.  DISCHARGE MEDICATIONS: 1. Azithromycin 500 mg daily for 5 days. 2. Prednisone taper. 3. Symbicort 1 puff inhaled b.i.d. 4. Albuterol inhaler every 4 hours as needed for shortness of breath.     Albuterol nebulized every 6 hours. 5. Asmanex inhaler 1 puff daily as needed for shortness of breath. 6. Ipratropium inhaler 1 puff every 4 hours as needed. 7. Ipratropium nebulizations every 4 hours.  [facility]  DIET:  Heart healthy.  PHYSICAL ACTIVITY:  As before.  Counseled regarding smoking and cocaine use.  Total time of this discharge encounter 35 minutes.  Osvaldo Shipper, MD     GK/MEDQ  D:  02/08/2011  T:  02/08/2011  Job:  [id number]  cc:   [facility]  Electronically  Signed by Osvaldo Shipper MD on 02/17/2011 06:47:09 AM

## 2011-03-26 ENCOUNTER — Inpatient Hospital Stay (HOSPITAL_COMMUNITY)
Admission: EM | Admit: 2011-03-26 | Discharge: 2011-03-26 | DRG: 313 | Discharge status: Against Medical Advice | Payer: Non-veteran care | Attending: Internal Medicine | Admitting: Internal Medicine

## 2011-03-26 ENCOUNTER — Emergency Department (HOSPITAL_COMMUNITY): Payer: Non-veteran care

## 2011-03-26 DIAGNOSIS — Z9119 Patient's noncompliance with other medical treatment and regimen: Secondary | ICD-10-CM

## 2011-03-26 DIAGNOSIS — F17200 Nicotine dependence, unspecified, uncomplicated: Secondary | ICD-10-CM | POA: Diagnosis present

## 2011-03-26 DIAGNOSIS — Z720 Tobacco use: Secondary | ICD-10-CM | POA: Diagnosis present

## 2011-03-26 DIAGNOSIS — J441 Chronic obstructive pulmonary disease with (acute) exacerbation: Secondary | ICD-10-CM | POA: Diagnosis present

## 2011-03-26 DIAGNOSIS — Z79899 Other long term (current) drug therapy: Secondary | ICD-10-CM

## 2011-03-26 DIAGNOSIS — F1490 Cocaine use, unspecified, uncomplicated: Secondary | ICD-10-CM | POA: Diagnosis present

## 2011-03-26 DIAGNOSIS — R079 Chest pain, unspecified: Secondary | ICD-10-CM | POA: Diagnosis present

## 2011-03-26 DIAGNOSIS — Z91199 Patient's noncompliance with other medical treatment and regimen due to unspecified reason: Secondary | ICD-10-CM

## 2011-03-26 DIAGNOSIS — F1410 Cocaine abuse, uncomplicated: Secondary | ICD-10-CM

## 2011-03-26 DIAGNOSIS — R0789 Other chest pain: Principal | ICD-10-CM | POA: Diagnosis present

## 2011-03-26 LAB — CBC
Hemoglobin: 15 g/dL (ref 13.0–17.0)
MCH: 31.7 pg (ref 26.0–34.0)
MCHC: 34.6 g/dL (ref 30.0–36.0)
RDW: 13.8 % (ref 11.5–15.5)

## 2011-03-26 LAB — DIFFERENTIAL
Basophils Absolute: 0 10*3/uL (ref 0.0–0.1)
Basophils Relative: 0 % (ref 0–1)
Eosinophils Absolute: 0.1 10*3/uL (ref 0.0–0.7)
Monocytes Absolute: 0.3 10*3/uL (ref 0.1–1.0)
Monocytes Relative: 4 % (ref 3–12)
Neutro Abs: 5.2 10*3/uL (ref 1.7–7.7)
Neutrophils Relative %: 81 % — ABNORMAL HIGH (ref 43–77)

## 2011-03-26 LAB — BASIC METABOLIC PANEL
BUN: 15 mg/dL (ref 6–23)
Chloride: 106 mEq/L (ref 96–112)
Creatinine, Ser: 1.77 mg/dL — ABNORMAL HIGH (ref 0.50–1.35)
GFR calc Af Amer: 47 mL/min — ABNORMAL LOW (ref >90)
GFR calc non Af Amer: 40 mL/min — ABNORMAL LOW (ref >90)
Potassium: 3.8 mEq/L (ref 3.5–5.1)

## 2011-03-26 LAB — RAPID URINE DRUG SCREEN, HOSP PERFORMED
Benzodiazepines: NOT DETECTED
Opiates: NOT DETECTED

## 2011-03-26 LAB — TROPONIN I: Troponin I: <0.3 ng/mL (ref <0.30)

## 2011-03-26 MED ORDER — ASPIRIN 81 MG PO CHEW
324.0000 mg | CHEWABLE_TABLET | Freq: Once | ORAL | Status: AC
  Administered 2011-03-26: 324 mg via ORAL
  Filled 2011-03-26: qty 4

## 2011-03-26 MED ORDER — IPRATROPIUM BROMIDE 0.02 % IN SOLN
0.5000 mg | Freq: Once | RESPIRATORY_TRACT | Status: AC
  Administered 2011-03-26: 0.5 mg via RESPIRATORY_TRACT
  Filled 2011-03-26: qty 2.5

## 2011-03-26 MED ORDER — ALBUTEROL SULFATE (5 MG/ML) 0.5% IN NEBU
INHALATION_SOLUTION | RESPIRATORY_TRACT | Status: AC
  Filled 2011-03-26: qty 2

## 2011-03-26 MED ORDER — METHYLPREDNISOLONE SODIUM SUCC 125 MG IJ SOLR
INTRAMUSCULAR | Status: AC
  Filled 2011-03-26: qty 2

## 2011-03-26 MED ORDER — LORAZEPAM 2 MG/ML IJ SOLN
1.0000 mg | Freq: Once | INTRAMUSCULAR | Status: AC
  Administered 2011-03-26: 1 mg via INTRAVENOUS
  Filled 2011-03-26: qty 1

## 2011-03-26 MED ORDER — ALBUTEROL SULFATE (5 MG/ML) 0.5% IN NEBU
5.0000 mg | INHALATION_SOLUTION | Freq: Once | RESPIRATORY_TRACT | Status: AC
  Administered 2011-03-26: 5 mg via RESPIRATORY_TRACT
  Filled 2011-03-26: qty 1

## 2011-03-26 MED ORDER — ALBUTEROL (5 MG/ML) CONTINUOUS INHALATION SOLN
10.0000 mg/h | INHALATION_SOLUTION | Freq: Once | RESPIRATORY_TRACT | Status: AC
  Administered 2011-03-26: 10 mg/h via RESPIRATORY_TRACT
  Filled 2011-03-26: qty 20

## 2011-03-26 MED ORDER — METHYLPREDNISOLONE SODIUM SUCC 125 MG IJ SOLR: 125.0000 mg | Freq: Once | INTRAMUSCULAR | Status: DC

## 2011-03-26 MED ORDER — SODIUM CHLORIDE 0.9 % IV SOLN
INTRAVENOUS | Status: DC
  Administered 2011-03-26: 22:00:00 via INTRAVENOUS

## 2011-03-26 NOTE — ED Notes (Signed)
Patient denies pain and is resting comfortably. EKG completed by ED tech

## 2011-03-26 NOTE — ED Notes (Signed)
Gave old and new ECG to Dr. Radford Pax after I performed.

## 2011-03-26 NOTE — ED Notes (Signed)
Patient being transported upstairs on portable cardiac monitor with RN. 

## 2011-03-26 NOTE — ED Notes (Signed)
Resp is at bedside 

## 2011-03-26 NOTE — ED Notes (Signed)
Received bedside report from [place], [place].  Patient currently sitting up in bed; no respiratory or acute distress noted.  Introduced self to patient and updated whiteboard in room.  Patient has no questions or concerns at this time.  Updated patient on plan of care; informed patient that we are waiting for the admitting physician to come by and see patient.  Will continue to monitor.

## 2011-03-26 NOTE — ED Notes (Signed)
Calling report now. 

## 2011-03-26 NOTE — H&P (Signed)
Hospital Admission Note Date: 03/26/2011  Patient name: Gerald Williams Medical record number: [id number] Date of birth: [birth year] Age: 60 y.o. Gender: male PCP: [facility], MD  Medical Service: Gerald Williams - B 1  Attending physician:  Dr. Josem Kaufmann  Pager: Resident (R2/R3):  Dr. Arvilla Market  Pager: [phone] Resident (R1):  Dr. Bosie Clos  [phone]  Chief Complaint: SOB, chest pain  History of Present Illness: The pt is a 61 YO man who comes in today with increasing SOB. He states that he is a [place] pt and ran out of his inhalers several days ago at which point his breathing worsened. He states he may have been feeling a little warm at home but denied any change to his cough or sputum (chronically white). He tried his albuterol which did not help so he came to the ED where he was given breathing treatments. He then improved but was still slightly tachypnic. He then started complaining of chest pain and his drug screen came back positive for cocaine which the pt denies. He states he has not been to the [place] hospital in 4 months although he has an appointment with them later in December. No abdominal pain or extremity pain. No dysuria, diarrhea, vomiting. History of past hospitalizations however no history of intubation in the past. No other medical problems. Pt currently admits to smoking 0.5 packs per day although said at one point he smoked packs per day.  Current Facility-Administered Medications  Medication Dose Route Frequency Provider Last Rate Last Dose  . albuterol (PROVENTIL) (5 MG/ML) 0.5% nebulizer solution           . albuterol (PROVENTIL,VENTOLIN) solution continuous neb  10 mg/hr Nebulization Once [employer]   10 mg/hr at 03/26/11 1540  . aspirin chewable tablet 324 mg  324 mg Oral Once [employer]   324 mg at 03/26/11 1803  . ipratropium (ATROVENT) nebulizer solution 0.5 mg  0.5 mg Nebulization Once [employer]   0.5 mg at 03/26/11 1539  . LORazepam (ATIVAN) injection 1  mg  1 mg Intravenous Once [employer]   1 mg at 03/26/11 1944  . methylPREDNISolone sodium succinate (SOLU-MEDROL) 125 MG injection           . DISCONTD: methylPREDNISolone sodium succinate (SOLU-MEDROL) 125 MG injection 125 mg  125 mg Intravenous Once Milus Glazier       Current Outpatient Prescriptions  Medication Sig Dispense Refill  . albuterol (PROVENTIL) (2.5 MG/3ML) 0.083% nebulizer solution Take 2.5 mg by nebulization 4 (four) times daily.        Marland Kitchen albuterol-ipratropium (COMBIVENT) 18-103 MCG/ACT inhaler Inhale into the lungs every 6 (six) hours as needed. For wheezing       . budesonide-formoterol (SYMBICORT) 160-4.5 MCG/ACT inhaler Inhale 2 puffs into the lungs 2 (two) times daily.        Marland Kitchen ipratropium (ATROVENT) 0.02 % nebulizer solution Take 500 mcg by nebulization 4 (four) times daily.        Marland Kitchen OVER THE COUNTER MEDICATION Take 30 mLs by mouth daily as needed. Tylenol Cold and Sinus Liquid         Allergies: Review of patient's allergies indicates no known allergies.  Past Medical History  Diagnosis Date  . Emphysema     History reviewed. No pertinent past surgical history.  History reviewed. No pertinent family history.  History   Social History  . Marital Status: Divorced    Spouse Name: N/A    Number of Children: N/A  . Years  of Education: N/A   Occupational History  . Not on file.   Social History Main Topics  . Smoking status: Current Everyday Smoker -- 0.5 packs/day  . Smokeless tobacco: Not on file  . Alcohol Use: No  . Drug Use: No  . Sexually Active:    Other Topics Concern  . Not on file   Social History Narrative  . No narrative on file    Review of Systems: Pertinent items are noted in HPI.  Physical Exam:  Filed Vitals:   03/26/11 1757 03/26/11 1833 03/26/11 1914 03/26/11 1948  BP: 94/64 105/67 86/63 102/74  Pulse: 93 98  89  Temp:      TempSrc:      Resp:  42 28 22  SpO2: 94% 95% 96% 96%    General: resting in bed,  sleepy but arousable HEENT: PERRL, EOMI, no scleral icterus Cardiac: RRR, no rubs, murmurs or gallops Pulm: expiratory wheezing heard in lungs, air flow was heard over all lung fields, pt not in respiratory distress, not tachypnic, no accessory muscle usage, no use of abdominal muscles Abd: soft, nontender, nondistended, BS present Ext: warm and well perfused, no pedal edema, no calf tenderness Neuro: alert and oriented X3, cranial nerves II-XII grossly intact, strength and sensation to light touch equal in bilateral upper and lower extremities  Labs: Results for orders placed during the hospital encounter of 03/26/11 (from the past 24 hour(s))  CBC     Status: Normal   Collection Time   03/26/11  3:36 PM      Component Value Range   WBC 6.5  4.0 - 10.5 (K/uL)   RBC 4.73  4.22 - 5.81 (MIL/uL)   Hemoglobin 15.0  13.0 - 17.0 (g/dL)   HCT 16.1  09.6 - 04.5 (%)   MCV 91.8  78.0 - 100.0 (fL)   MCH 31.7  26.0 - 34.0 (pg)   MCHC 34.6  30.0 - 36.0 (g/dL)   RDW 40.9  81.1 - 91.4 (%)   Platelets 205  150 - 400 (K/uL)  DIFFERENTIAL     Status: Abnormal   Collection Time   03/26/11  3:36 PM      Component Value Range   Neutrophils Relative 81 (*) 43 - 77 (%)   Neutro Abs 5.2  1.7 - 7.7 (K/uL)   Lymphocytes Relative 13  12 - 46 (%)   Lymphs Abs 0.8  0.7 - 4.0 (K/uL)   Monocytes Relative 4  3 - 12 (%)   Monocytes Absolute 0.3  0.1 - 1.0 (K/uL)   Eosinophils Relative 2  0 - 5 (%)   Eosinophils Absolute 0.1  0.0 - 0.7 (K/uL)   Basophils Relative 0  0 - 1 (%)   Basophils Absolute 0.0  0.0 - 0.1 (K/uL)  BASIC METABOLIC PANEL     Status: Abnormal   Collection Time   03/26/11  3:36 PM      Component Value Range   Sodium 140  135 - 145 (mEq/L)   Potassium 3.8  3.5 - 5.1 (mEq/L)   Chloride 106  96 - 112 (mEq/L)   CO2 27  19 - 32 (mEq/L)   Glucose, Bld 107 (*) 70 - 99 (mg/dL)   BUN 15  6 - 23 (mg/dL)   Creatinine, Ser 7.82 (*) 0.50 - 1.35 (mg/dL)   Calcium 8.5  8.4 - 95.6 (mg/dL)   GFR calc  non Af Amer 40 (*) >90 (mL/min)   GFR calc Af Amer 47 (*) >  90 (mL/min)  TROPONIN I     Status: Normal   Collection Time   03/26/11  5:40 PM      Component Value Range   Troponin I <0.30  <0.30 (ng/mL)  URINE RAPID DRUG SCREEN (HOSP PERFORMED)     Status: Abnormal   Collection Time   03/26/11  6:01 PM      Component Value Range   Opiates NONE DETECTED  NONE DETECTED    Cocaine POSITIVE (*) NONE DETECTED    Benzodiazepines NONE DETECTED  NONE DETECTED    Amphetamines NONE DETECTED  NONE DETECTED    Tetrahydrocannabinol NONE DETECTED  NONE DETECTED    Barbiturates NONE DETECTED  NONE DETECTED     Imaging results:  PORTABLE CHEST - 1 VIEW  Comparison: 02/06/2011 and 04/29/2009  Findings: Single view of the chest demonstrates a stable small  calcification in the right upper chest. Lucency in the lung fields  probably represents emphysematous change. There is no focal  airspace disease. Heart and mediastinum are within normal limits.  External device overlying the right upper chest.  IMPRESSION:  Stable chest radiograph findings without focal disease.  Limited evaluation of the medial right upper lung due to an  overlying device.  Other results: EKG: low voltage, no signs of ACS or STEMI  Assessment & Plan by Problem: 1) Atypical Chest Pain: As described in HPI and per physical examination and initial lab findings- chest pain looks less likely to be secondary to ACS. It seems likely from cocaine abuse. Although may also be due to reflux disease. He had similar admission in October 2012 and was ruled out for ACS. 12-lead EKG in the ER does not show any signs of ACS.  Plan: - Admit to telemetry bed. - Cycle cardiac enzymes x3. - Tylenol for pain -will give tramadol if needed.   2) COPD: He has diffuse expiratory wheezes bilaterally with mild shortness of breath after receiving nebulizer treatment in ER. He misplaced his inhalers for last 2 days. Denies any cough or sputum  production and CXR does not show any new infiltrates. So the worsening of his breathing seems to be due to medication noncompliance rather than infectious etiology. Also cocaine abuse may be playing a role in this exacerbation. Dehydration may be present due to lack of po intake and insensible respiratory loss; Cr is mildly elevated but not meeting criteria for AKI.  Plan: -Continue albuterol and Atrovent nebulizers every 6 hours. Give prednisone PO and use taper.  - Will hold off antibiotics for now and discussed with Dr. Josem Kaufmann in morning and start if felt indicated. Will also given fluids overnight to help with respiratory loss and mild elevation in Cr.   3) Cocaine abuse: Denies cocaine use but UDS positive for cocaine- which was also positive in October 2012. This seems to be a likely cause of his chest pain and COPD exacerbation as described above.  Plan: Social work consult for cocaine abuse and tobacco abuse.  4) Tobacco abuse: Currently smokes 4-5 cigarettes a day. used to smoke a few packs a day before.  Plan: CSW consult as above.  5) DVT prophylaxis: Lovenox.

## 2011-03-26 NOTE — ED Notes (Signed)
Port CXR completed

## 2011-03-26 NOTE — Progress Notes (Signed)
11:49 PM Pt seen at the bedside in the hallway of 4700. He was taken up there for bed placement and would like a private room. He insists that the "[facility] room and I won't stay if I don't have one". I explained that the hospital is very full and that none were available at that time and that by morning one may be available. He declined staying so I explained the risk of missing a heart attack or death by leaving before he was ready. He verbalized understanding and re-affirmed that he wanted to leave. I did call the bed coordinator to see if any private beds were available and none were so I discussed this with the pt and he did sign the AMA papers and leave at that time. They also mentioned that they spoke with him about this in the ED and at that time he had agreed to wait until the morning.

## 2011-03-26 NOTE — ED Notes (Signed)
Called 4700; patient assigned to semi-private room.  Patient refuses to go to semi-private room and requesting to change his floor to a step down unit so that he can go to a private room.  Charge RN Reita Cliche, Charity fundraiser) notified of situation.

## 2011-03-26 NOTE — ED Notes (Signed)
outpt clinic Dr Allena Katz responded and will admit to teaching service

## 2011-03-26 NOTE — ED Notes (Signed)
Report given to [facility] on 4700.  Patient states that he refuses to go to the room if semi-private.  Patient currently complaining of shortness of breath with audible wheezing heard.  Nebulizer treatment ordered, per ED protocol.

## 2011-03-26 NOTE — ED Provider Notes (Signed)
History     CSN: [id number] Arrival date & time: 03/26/2011  2:43 PM   First MD Initiated Contact with Patient 03/26/11 1506      Chief Complaint  Patient presents with  . Shortness of Breath    tried home nebs without improvement    (Consider location/radiation/quality/duration/timing/severity/associated sxs/prior treatment) Patient is a 60 y.o. male presenting with shortness of breath. The history is provided by the patient.  Shortness of Breath  Associated symptoms include cough (white sputum) and shortness of breath. Pertinent negatives include no chest pain, no fever and no wheezing.   patient is a 60 year old male with a history of COPD presents with dyspnea. He states it started yesterday has been constant and progressively worsening. This morning it was severe. He used multiple rounds of his home Combivent and albuterol. He has had minimal improvement with this. He notes mild associated cough heart disease white sputum). The spine is made worse with exertion. He notes associated chest tightness but no chest pain.  Denies fever. States he had his flu vaccine. Patient notes he ran out of his brief about 4 or 5 days ago. He has had hospitalizations for COPD but never required intubation. Overall severity described as moderate to severe.  Past Medical History  Diagnosis Date  . Emphysema     History reviewed. No pertinent past surgical history.  History reviewed. No pertinent family history.  History  Substance Use Topics  . Smoking status: Current Everyday Smoker -- 0.5 packs/day  . Smokeless tobacco: Not on file  . Alcohol Use: No      Review of Systems  Constitutional: Positive for chills. Negative for fever and activity change.  HENT: Negative for congestion and neck pain.   Respiratory: Positive for cough (white sputum), chest tightness and shortness of breath. Negative for wheezing.   Cardiovascular: Negative for chest pain.  Gastrointestinal: Negative for nausea,  vomiting, abdominal pain, diarrhea and abdominal distention.  Genitourinary: Negative for difficulty urinating.  Musculoskeletal: Negative for gait problem.  Skin: Negative for rash.  Neurological: Negative for weakness and numbness.  Psychiatric/Behavioral: Negative for behavioral problems and confusion.  All other systems reviewed and are negative.    Allergies  Review of patient's allergies indicates no known allergies.  Home Medications   Current Outpatient Rx  Name Route Sig Dispense Refill  . ALBUTEROL SULFATE (2.5 MG/3ML) 0.083% IN NEBU Nebulization Take 2.5 mg by nebulization 4 (four) times daily.      Maximino Greenland 18-103 MCG/ACT IN AERO Inhalation Inhale into the lungs every 6 (six) hours as needed. For wheezing     . BUDESONIDE-FORMOTEROL FUMARATE 160-4.5 MCG/ACT IN AERO Inhalation Inhale 2 puffs into the lungs 2 (two) times daily.      . IPRATROPIUM BROMIDE 0.02 % IN SOLN Nebulization Take 500 mcg by nebulization 4 (four) times daily.      Marland Kitchen OVER THE COUNTER MEDICATION Oral Take 30 mLs by mouth daily as needed. Tylenol Cold and Sinus Liquid       BP 114/76  Pulse 79  Temp(Src) 97.9 F (36.6 C) (Oral)  Resp 25  SpO2 96%  Physical Exam  Nursing note and vitals reviewed. Constitutional: He is oriented to person, place, and time. He appears well-developed and well-nourished. No distress.  HENT:  Head: Normocephalic.  Nose: Nose normal.  Eyes: EOM are normal. Pupils are equal, round, and reactive to light.  Neck: Normal range of motion. Neck supple. No JVD present.  Cardiovascular: Normal rate, regular rhythm and  intact distal pulses.   No murmur heard. Pulmonary/Chest: Effort normal and breath sounds normal. He exhibits no tenderness.       Mild to moderate respiratory distress. Tachypnea, prolonged expirations, inspiratory and expiratory  Wheezes with decreased air movement throughout. No focal crackles. Speech in phrases.  Abdominal: Soft. Bowel sounds  are normal. He exhibits no distension. There is no tenderness.  Musculoskeletal: Normal range of motion. He exhibits no edema and no tenderness.       No calf ttp  Neurological: He is alert and oriented to person, place, and time.       Normal strength  Alert and oriented  Skin: Skin is warm and dry. He is not diaphoretic.  Psychiatric: He has a normal mood and affect. His behavior is normal. Thought content normal.    ED Course  Procedures (including critical care time)  Date: 03/26/2011  Rate: 98  Rhythm: normal sinus rhythm  QRS Axis: normal  Intervals: normal  ST/T Wave abnormalities: nonspecific ST changes  Conduction Disutrbances:none  Narrative Interpretation: poor quality; lateral lead movement artifact, low voltage  Old EKG Reviewed: no significant change   Labs Reviewed  DIFFERENTIAL - Abnormal; Notable for the following:    Neutrophils Relative 81 (*)    All other components within normal limits  BASIC METABOLIC PANEL - Abnormal; Notable for the following:    Glucose, Bld 107 (*)    Creatinine, Ser 1.77 (*)    GFR calc non Af Amer 40 (*)    GFR calc Af Amer 47 (*)    All other components within normal limits  URINE RAPID DRUG SCREEN (HOSP PERFORMED) - Abnormal; Notable for the following:    Cocaine POSITIVE (*)    All other components within normal limits  CBC  TROPONIN I   Dg Chest Portable 1 View  03/26/2011  *RADIOLOGY REPORT*  Clinical Data: Shortness of breath.  COPD.  PORTABLE CHEST - 1 VIEW  Comparison: 02/06/2011 and 04/29/2009  Findings: Single view of the chest demonstrates a stable small calcification in the right upper chest.  Lucency in the lung fields probably represents emphysematous change.  There is no focal airspace disease.  Heart and mediastinum are within normal limits. External device overlying the right upper chest.  IMPRESSION: Stable chest radiograph findings without focal disease.  Limited evaluation of the medial right upper lung due to  an overlying device.  Original Report Authenticated By: Richarda Overlie, M.D.     No diagnosis found. COPD exacerbation Chest pain Cocaine abuse   MDM   The patient presents with dyspnea. He has a history of COPD and states he ran out of his spiriva about 4 or 5 days ago. Overall he states this is similar to prior COPD exacerbations. He is having mild cough with white sputum but no significant productive cough and no fever. No other infectious symptoms.  On initial exam patient with decreased air movement throughout mild distress and inspiratory and expiratory wheezes. Chest x-ray is negative. Patient given second round of albuterol (after EMS one) and had significant improvement in his pulmonary exam. He had much improved air movement with scattered inspiratory wheezes.  Patient initially noted across the chest tightness with his initial primary presenting dyspnea.  After respiratory status is improving patient did note low left-sided chest pain. He states that he is not had this recently and is not typical for COPD exacerbations. Patient is also noted to be fairly tachypnea. He is reexamined side decent air movement. With  low voltage on EKG and tachypnea, bedside ultrasound performed. Poor windows but subcostal view was negative for significant pericardial effusion. The patient denied substance abuse UDS was done. This was positive for cocaine. Troponins are negative but with setting of cocaine positive urine and chest pain there is concern for cocaine chest pain. Ativan given once and patient improvement in tachypnea and mild improvement in other chest discomfort. Internal medicine consult it for COPD exacerbation and likely chest pain related to cocaine use. No leukocytosis, fever, consolidation on chest x-ray. No acute indication for antibiotics. Exacerbation likely related to spiriva noncompliance. Patient admitted. Patient did not have any episodes of hypoxia or hemodynamic instability while in the  emergency department. His systolic blood pressure was consistently in the low 100s.        Milus Glazier 03/27/11 0202  Mike Schinlever 03/27/11 0213  I saw and evaluated the patient, reviewed the resident's note and I agree with the findings and plan.  Cyndra Numbers, MD 03/28/11 [phone]

## 2011-03-26 NOTE — ED Notes (Signed)
Resp called for tx

## 2011-03-26 NOTE — ED Notes (Signed)
Pt c/o increasing shob since this am

## 2011-03-26 NOTE — ED Notes (Signed)
Patient currently resting quietly in bed; no respiratory or acute distress noted.  Will continue to monitor. 

## 2011-03-26 NOTE — ED Notes (Signed)
Patient currently resting quietly in bed; no respiratory or acute distress noted.  Patient updated on plan of care; informed patient that a bed is available and report is being called.  Patient has no other questions or concerns at this time.  Will continue to monitor

## 2011-03-27 NOTE — H&P (Signed)
Gerald Williams left AMA last night before I had the opportunity to perform a history and physical examination.

## 2011-03-29 NOTE — Discharge Summary (Signed)
Taft Worthing WUJ:[id number]  Admission date: 03/26/11 Discharge date: 03/26/11  Attending: Dr. Josem Kaufmann Discharge medications: none, patient left AMA PCP: [facility]  Admission H and P: Chief Complaint: SOB, chest pain  History of Present Illness:  The pt is a 60 YO man who comes in today with increasing SOB. He states that he is a [place] pt and ran out of his inhalers several days ago at which point his breathing worsened. He states he may have been feeling a little warm at home but denied any change to his cough or sputum (chronically white). He tried his albuterol which did not help so he came to the ED where he was given breathing treatments. He then improved but was still slightly tachypnic. He then started complaining of chest pain and his drug screen came back positive for cocaine which the pt denies. He states he has not been to the [place] hospital in 4 months although he has an appointment with them later in December. No abdominal pain or extremity pain. No dysuria, diarrhea, vomiting. History of past hospitalizations however no history of intubation in the past. No other medical problems. Pt currently admits to smoking 0.5 packs per day although said at one point he smoked packs per day. Current Outpatient Prescriptions   Medication  Sig  Dispense  Refill   .  albuterol (PROVENTIL) (2.5 MG/3ML) 0.083% nebulizer solution  Take 2.5 mg by nebulization 4 (four) times daily.     Marland Kitchen  albuterol-ipratropium (COMBIVENT) 18-103 MCG/ACT inhaler  Inhale into the lungs every 6 (six) hours as needed. For wheezing     .  budesonide-formoterol (SYMBICORT) 160-4.5 MCG/ACT inhaler  Inhale 2 puffs into the lungs 2 (two) times daily.     Marland Kitchen  ipratropium (ATROVENT) 0.02 % nebulizer solution  Take 500 mcg by nebulization 4 (four) times daily.     Marland Kitchen  OVER THE COUNTER MEDICATION  Take 30 mLs by mouth daily as needed. Tylenol Cold and Sinus Liquid      Allergies:  Review of patient's allergies indicates no  known allergies.  Past Medical History   Diagnosis  Date   .  Emphysema     Physical Exam:  Filed Vitals:    03/26/11 1757  03/26/11 1833  03/26/11 1914  03/26/11 1948   BP:  94/64  105/67  86/63  102/74   Pulse:  93  98   89   Temp:       TempSrc:       Resp:   42  28  22   SpO2:  94%  95%  96%  96%    General: resting in bed, sleepy but arousable  HEENT: PERRL, EOMI, no scleral icterus  Cardiac: RRR, no rubs, murmurs or gallops  Pulm: expiratory wheezing heard in lungs, air flow was heard over all lung fields, pt not in respiratory distress, not tachypnic, no accessory muscle usage, no use of abdominal muscles  Abd: soft, nontender, nondistended, BS present  Ext: warm and well perfused, no pedal edema, no calf tenderness  Neuro: alert and oriented X3, cranial nerves II-XII grossly intact, strength and sensation to light touch equal in bilateral upper and lower extremities  Labs:  Results for orders placed during the hospital encounter of 03/26/11 (from the past 24 hour(s))   CBC Status: Normal    Collection Time    03/26/11 3:36 PM   Component  Value  Range    WBC  6.5  4.0 - 10.5 (  K/uL)    RBC  4.73  4.22 - 5.81 (MIL/uL)    Hemoglobin  15.0  13.0 - 17.0 (g/dL)    HCT  45.4  09.8 - 11.9 (%)    MCV  91.8  78.0 - 100.0 (fL)    MCH  31.7  26.0 - 34.0 (pg)    MCHC  34.6  30.0 - 36.0 (g/dL)    RDW  14.7  82.9 - 56.2 (%)    Platelets  205  150 - 400 (K/uL)   DIFFERENTIAL Status: Abnormal    Collection Time    03/26/11 3:36 PM   Component  Value  Range    Neutrophils Relative  81 (*)  43 - 77 (%)    Neutro Abs  5.2  1.7 - 7.7 (K/uL)    Lymphocytes Relative  13  12 - 46 (%)    Lymphs Abs  0.8  0.7 - 4.0 (K/uL)    Monocytes Relative  4  3 - 12 (%)    Monocytes Absolute  0.3  0.1 - 1.0 (K/uL)    Eosinophils Relative  2  0 - 5 (%)    Eosinophils Absolute  0.1  0.0 - 0.7 (K/uL)    Basophils Relative  0  0 - 1 (%)    Basophils Absolute  0.0  0.0 - 0.1 (K/uL)   BASIC METABOLIC  PANEL Status: Abnormal    Collection Time    03/26/11 3:36 PM   Component  Value  Range    Sodium  140  135 - 145 (mEq/L)    Potassium  3.8  3.5 - 5.1 (mEq/L)    Chloride  106  96 - 112 (mEq/L)    CO2  27  19 - 32 (mEq/L)    Glucose, Bld  107 (*)  70 - 99 (mg/dL)    BUN  15  6 - 23 (mg/dL)    Creatinine, Ser  1.30 (*)  0.50 - 1.35 (mg/dL)    Calcium  8.5  8.4 - 10.5 (mg/dL)    GFR calc non Af Amer  40 (*)  >90 (mL/min)    GFR calc Af Amer  47 (*)  >90 (mL/min)   TROPONIN I Status: Normal    Collection Time    03/26/11 5:40 PM   Component  Value  Range    Troponin I  <0.30  <0.30 (ng/mL)   URINE RAPID DRUG SCREEN (HOSP PERFORMED) Status: Abnormal    Collection Time    03/26/11 6:01 PM   Component  Value  Range    Opiates  NONE DETECTED  NONE DETECTED    Cocaine  POSITIVE (*)  NONE DETECTED    Benzodiazepines  NONE DETECTED  NONE DETECTED    Amphetamines  NONE DETECTED  NONE DETECTED    Tetrahydrocannabinol  NONE DETECTED  NONE DETECTED    Barbiturates  NONE DETECTED  NONE DETECTED    Imaging results:  PORTABLE CHEST - 1 VIEW  Comparison: 02/06/2011 and 04/29/2009  Findings: Single view of the chest demonstrates a stable small  calcification in the right upper chest. Lucency in the lung fields  probably represents emphysematous change. There is no focal  airspace disease. Heart and mediastinum are within normal limits.  External device overlying the right upper chest.  IMPRESSION:  Stable chest radiograph findings without focal disease.  Limited evaluation of the medial right upper lung due to an  overlying device.  Other results: EKG: low voltage, no signs of  ACS or STEMI   Hospital course: The patient left AMA. The risks of leaving were explained to him prior to signing AMA paperwork. No discharge medications were prescribed and workup was incomplete. No hospital follow up was made however [facility]

## 2011-04-02 ENCOUNTER — Emergency Department (HOSPITAL_COMMUNITY)
Admission: EM | Admit: 2011-04-02 | Discharge: 2011-04-03 | Disposition: A | Discharge status: Home / Self Care | Payer: Non-veteran care | Attending: Emergency Medicine | Admitting: Emergency Medicine

## 2011-04-02 ENCOUNTER — Encounter (HOSPITAL_COMMUNITY): Payer: Self-pay | Admitting: *Deleted

## 2011-04-02 ENCOUNTER — Emergency Department (HOSPITAL_COMMUNITY): Payer: Non-veteran care

## 2011-04-02 DIAGNOSIS — R0602 Shortness of breath: Secondary | ICD-10-CM | POA: Insufficient documentation

## 2011-04-02 DIAGNOSIS — F17200 Nicotine dependence, unspecified, uncomplicated: Secondary | ICD-10-CM | POA: Insufficient documentation

## 2011-04-02 DIAGNOSIS — R079 Chest pain, unspecified: Secondary | ICD-10-CM | POA: Insufficient documentation

## 2011-04-02 DIAGNOSIS — J438 Other emphysema: Secondary | ICD-10-CM | POA: Insufficient documentation

## 2011-04-02 DIAGNOSIS — R109 Unspecified abdominal pain: Secondary | ICD-10-CM | POA: Insufficient documentation

## 2011-04-02 LAB — TROPONIN I: Troponin I: <0.3 ng/mL (ref <0.30)

## 2011-04-02 LAB — POCT I-STAT, CHEM 8
BUN: 23 mg/dL (ref 6–23)
Chloride: 105 mEq/L (ref 96–112)
Creatinine, Ser: 2 mg/dL — ABNORMAL HIGH (ref 0.50–1.35)
Sodium: 139 mEq/L (ref 135–145)

## 2011-04-02 LAB — POCT I-STAT TROPONIN I: Troponin i, poc: 0 ng/mL (ref 0.00–0.08)

## 2011-04-02 NOTE — ED Notes (Signed)
Pt c/o midsternal CP that started around 1900. Pt currently pain free. Pt denies sob. Pt denies n/v.

## 2011-04-02 NOTE — ED Notes (Signed)
Patient with acute onset of right sided chest pain that happened about an hour ago.  Patient used his breathing treatment without relief.  Patient has history of emphysema.  Patient is now currently chest pain free

## 2011-04-02 NOTE — ED Provider Notes (Addendum)
History     CSN: [id number] Arrival date & time: 04/02/2011  7:04 PM   First MD Initiated Contact with Patient 04/02/11 2227      Chief Complaint  Patient presents with  . Chest Pain    (Consider location/radiation/quality/duration/timing/severity/associated sxs/prior treatment) HPI This is a 60 year old black male who had the onset of chest pain at about 7 PM today. He states it felt like indigestion, was moderate in severity. It was located in the left lower chest and across the upper abdomen. It was a exacerbated by movement and worse sitting up; the pain was not present when leaning forward. The pain lasted about 2 hours and is now gone. There was no associated dyspnea, diaphoresis or nausea. He tried a breathing treatment without relief, even though he had no shortness of breath with it.  Past Medical History  Diagnosis Date  . Emphysema     History reviewed. No pertinent past surgical history.  History reviewed. No pertinent family history.  History  Substance Use Topics  . Smoking status: Current Everyday Smoker -- 0.5 packs/day  . Smokeless tobacco: Not on file  . Alcohol Use: No      Review of Systems  All other systems reviewed and are negative.    Allergies  Review of patient's allergies indicates no known allergies.  Home Medications   Current Outpatient Rx  Name Route Sig Dispense Refill  . ALBUTEROL SULFATE (2.5 MG/3ML) 0.083% IN NEBU Nebulization Take 2.5 mg by nebulization 4 (four) times daily.      Maximino Greenland 18-103 MCG/ACT IN AERO Inhalation Inhale into the lungs every 6 (six) hours as needed. For wheezing     . BUDESONIDE-FORMOTEROL FUMARATE 160-4.5 MCG/ACT IN AERO Inhalation Inhale 2 puffs into the lungs 2 (two) times daily.      . IPRATROPIUM BROMIDE 0.02 % IN SOLN Nebulization Take 500 mcg by nebulization 4 (four) times daily.      Marland Kitchen OVER THE COUNTER MEDICATION Oral Take 30 mLs by mouth daily as needed. Tylenol Cold and Sinus  Liquid. For cold.      BP 98/69  Pulse 78  Temp(Src) 97.6 F (36.4 C) (Oral)  Resp 20  SpO2 98%  Physical Exam General: Well-developed, well-nourished male in no acute distress; appearance consistent with age of record HENT: normocephalic, atraumatic Eyes: pupils equal round and reactive to light; extraocular muscles intact Neck: supple Heart: regular rate and rhythm; distant sounds Lungs: clear to auscultation bilaterally Abdomen: soft; nontender; nondistended Extremities: No deformity; full range of motion; pulses normal; no edema Neurologic: Awake, alert and oriented; motor function intact in all extremities and symmetric; no facial droop Skin: Warm and dry Psychiatric: Normal mood and affect    ED Course  Procedures (including critical care time)    MDM   Nursing notes and vitals signs, including pulse oximetry, reviewed.  Summary of this visit's results, reviewed by myself:  Labs:  Results for orders placed during the hospital encounter of 04/02/11  TROPONIN I      Component Value Range   Troponin I <0.30  <0.30 (ng/mL)  POCT I-STAT, CHEM 8      Component Value Range   Sodium 139  135 - 145 (mEq/L)   Potassium 4.5  3.5 - 5.1 (mEq/L)   Chloride 105  96 - 112 (mEq/L)   BUN 23  6 - 23 (mg/dL)   Creatinine, Ser 2.13 (*) 0.50 - 1.35 (mg/dL)   Glucose, Bld 086 (*) 70 - 99 (mg/dL)  Calcium, Ion 1.11 (*) 1.12 - 1.32 (mmol/L)   TCO2 26  0 - 100 (mmol/L)   Hemoglobin 16.7  13.0 - 17.0 (g/dL)   HCT 08.6  57.8 - 46.9 (%)  POCT I-STAT TROPONIN I      Component Value Range   Troponin i, poc 0.00  0.00 - 0.08 (ng/mL)   Comment 3           POCT I-STAT TROPONIN I      Component Value Range   Troponin i, poc 0.01  0.00 - 0.08 (ng/mL)                Imaging Studies: Dg Chest 2 View  04/02/2011  *RADIOLOGY REPORT*  Clinical Data: Chest pain and short of breath for 1 day.  History of emphysema.  CHEST - 2 VIEW  Comparison: 03/26/2011  Findings: Emphysematous changes  and scattered fibrosis in the lungs. The heart size and pulmonary vascularity are normal. The lungs appear clear and expanded without focal air space disease or consolidation. No blunting of the costophrenic angles.  No pneumothorax.  No significant change since previous study.  IMPRESSION: Emphysematous changes in the lungs.  No evidence of active pulmonary disease.  Original Report Authenticated By: Marlon Pel, M.D.   EKG Interpretation:  Date & Time: 04/02/2011 06:54 PM  Rate: 89  Rhythm: normal sinus rhythm  QRS Axis: normal  Intervals: normal  ST/T Wave abnormalities: normal  Conduction Disutrbances:none  Narrative Interpretation:   Old EKG Reviewed: none available  12:32 AM Asymptomatic in ED. History.consistent with coronary artery etiology. Patient has followup appointment scheduled at the [place].            Hanley Seamen, MD 04/03/11 0032  Hanley Seamen, MD 04/03/11 6295

## 2011-04-12 NOTE — Discharge Summary (Signed)
Mr. Bera left against medical advice before I could examine him.

## 2011-08-24 ENCOUNTER — Encounter (HOSPITAL_COMMUNITY): Payer: Self-pay | Admitting: *Deleted

## 2011-08-24 ENCOUNTER — Observation Stay (HOSPITAL_COMMUNITY)
Admission: EM | Admit: 2011-08-24 | Discharge: 2011-08-24 | Disposition: A | Discharge status: Home / Self Care | Payer: Non-veteran care | Attending: Emergency Medicine | Admitting: Emergency Medicine

## 2011-08-24 ENCOUNTER — Emergency Department (HOSPITAL_COMMUNITY): Payer: Non-veteran care

## 2011-08-24 DIAGNOSIS — J441 Chronic obstructive pulmonary disease with (acute) exacerbation: Secondary | ICD-10-CM

## 2011-08-24 DIAGNOSIS — IMO0002 Reserved for concepts with insufficient information to code with codable children: Secondary | ICD-10-CM | POA: Insufficient documentation

## 2011-08-24 DIAGNOSIS — Z79899 Other long term (current) drug therapy: Secondary | ICD-10-CM | POA: Insufficient documentation

## 2011-08-24 DIAGNOSIS — J438 Other emphysema: Principal | ICD-10-CM | POA: Insufficient documentation

## 2011-08-24 MED ORDER — PREDNISONE 20 MG PO TABS: 60.0000 mg | ORAL_TABLET | Freq: Every day | ORAL | Status: AC

## 2011-08-24 MED ORDER — BUDESONIDE-FORMOTEROL FUMARATE 160-4.5 MCG/ACT IN AERO: 2.0000 | INHALATION_SPRAY | Freq: Two times a day (BID) | RESPIRATORY_TRACT | Status: DC

## 2011-08-24 MED ORDER — BUPROPION HCL ER (SMOKING DET) 150 MG PO TB12
150.0000 mg | ORAL_TABLET | Freq: Two times a day (BID) | ORAL | Status: DC
  Administered 2011-08-24: 150 mg via ORAL
  Filled 2011-08-24 (×2): qty 1

## 2011-08-24 MED ORDER — ALBUTEROL (5 MG/ML) CONTINUOUS INHALATION SOLN
15.0000 mg | INHALATION_SOLUTION | RESPIRATORY_TRACT | Status: DC
  Administered 2011-08-24: 15 mg via RESPIRATORY_TRACT

## 2011-08-24 MED ORDER — BUDESONIDE-FORMOTEROL FUMARATE 160-4.5 MCG/ACT IN AERO
2.0000 | INHALATION_SPRAY | Freq: Two times a day (BID) | RESPIRATORY_TRACT | Status: DC
  Administered 2011-08-24: 2 via RESPIRATORY_TRACT
  Filled 2011-08-24: qty 6

## 2011-08-24 MED ORDER — ALBUTEROL SULFATE (5 MG/ML) 0.5% IN NEBU
INHALATION_SOLUTION | RESPIRATORY_TRACT | Status: AC
  Filled 2011-08-24: qty 3

## 2011-08-24 MED ORDER — ALBUTEROL SULFATE (5 MG/ML) 0.5% IN NEBU
5.0000 mg | INHALATION_SOLUTION | Freq: Once | RESPIRATORY_TRACT | Status: AC
  Administered 2011-08-24: 5 mg via RESPIRATORY_TRACT
  Filled 2011-08-24: qty 1

## 2011-08-24 MED ORDER — PREDNISONE 20 MG PO TABS
60.0000 mg | ORAL_TABLET | Freq: Once | ORAL | Status: AC
  Administered 2011-08-24: 60 mg via ORAL
  Filled 2011-08-24: qty 3

## 2011-08-24 MED ORDER — IPRATROPIUM BROMIDE 0.02 % IN SOLN
0.5000 mg | Freq: Once | RESPIRATORY_TRACT | Status: AC
  Administered 2011-08-24: 0.5 mg via RESPIRATORY_TRACT
  Filled 2011-08-24: qty 2.5

## 2011-08-24 NOTE — ED Provider Notes (Signed)
CDU admission Note  CV:  Regular rate and rhythm, no murmurs rubs or gallops, no peripheral edema Pulmonary: Diminished throughout. Occasional wheeze appreciated.  Patient evaluated by myself initially. Discussed with mid-level provider. Patient will be transferred for breathing treatments and continued monitoring. Admit to CDU wheeze protocol. Orders as well as home meds ordered by myself.  Roth Ress, Catha Nottingham, MD 08/24/11 1325

## 2011-08-24 NOTE — ED Notes (Signed)
Pt is here for evaluation of sob unrelieved by inhalers at home.  Pt states that this increasing sob began yesterday.  No cough or cold with this.  No fever or chills.  Pt is tachypneic in triage, speaking in full sentences.

## 2011-08-24 NOTE — ED Provider Notes (Signed)
History   This chart was scribed for Cyndra Numbers, MD by Melba Coon. The patient was seen in room STRE6/STRE6 and the patient's care was started at 1:06PM.    CSN: [id number]  Arrival date & time 08/24/11  1154   First MD Initiated Contact with Patient 08/24/11 1304      Chief Complaint  Patient presents with  . Shortness of Breath    (Consider location/radiation/quality/duration/timing/severity/associated sxs/prior treatment) HPI Gerald Williams is a 61 y.o. male who presents to the Emergency Department complaining of persistent, moderate to severe SOB with an onset 2 days ago. Hx of COPD and needing breathing tx.  stopped smoking 2 days ago. Says he needs prednisone and symbicort.  He has been out of his Symbicort for 2 weeks. Breathing tx alleviated the symptoms; was given about 45 min ago in triage.Cough present. No CP. No HA, fever, neck pain, sore throat, rash, back pain, abd pain, n/v/d, dysuria, or extremity pain, edema, weakness, numbness, or tingling. No known allergies. No other pertinent medical symptoms. This does feel similar to prior COPD exacerbation. He reports that he has been admitted to observation protocol before and improved with ability to discharge. Patient is tachypneic but otherwise hemodynamically stable on presentation.  Past Medical History  Diagnosis Date  . Emphysema     History reviewed. No pertinent past surgical history.  No family history on file.  History  Substance Use Topics  . Smoking status: Current Everyday Smoker -- 0.5 packs/day  . Smokeless tobacco: Not on file  . Alcohol Use: No      Review of Systems 10 Systems reviewed and all are negative for acute change except as noted in the HPI.   Allergies  Review of patient's allergies indicates no known allergies.  Home Medications   Current Outpatient Rx  Name Route Sig Dispense Refill  . ALBUTEROL SULFATE (2.5 MG/3ML) 0.083% IN NEBU Nebulization Take 2.5 mg by nebulization 4  (four) times daily.      Maximino Greenland 18-103 MCG/ACT IN AERO Inhalation Inhale into the lungs every 6 (six) hours as needed. For wheezing     . BUDESONIDE-FORMOTEROL FUMARATE 160-4.5 MCG/ACT IN AERO Inhalation Inhale 2 puffs into the lungs 2 (two) times daily.      . BUPROPION HCL ER (SMOKING DET) 150 MG PO TB12 Oral Take 150 mg by mouth 2 (two) times daily. For stop smoking    . IPRATROPIUM BROMIDE 0.02 % IN SOLN Nebulization Take 500 mcg by nebulization 4 (four) times daily.        BP 115/83  Pulse 90  Temp(Src) 97.6 F (36.4 C) (Oral)  Resp 32  SpO2 96%  Physical Exam  Nursing note and vitals reviewed. Constitutional: He is oriented to person, place, and time. He appears well-developed and well-nourished. No distress.  HENT:  Head: Normocephalic and atraumatic.  Eyes: EOM are normal.  Neck: Normal range of motion. Neck supple. No tracheal deviation present.  Cardiovascular: Normal rate.   No murmur heard. Pulmonary/Chest: Tachypnea noted. No respiratory distress. He has wheezes (occasional).       Diminished lung sounds  Abdominal: Soft. There is no tenderness.  Musculoskeletal: Normal range of motion. He exhibits no edema and no tenderness.  Neurological: He is alert and oriented to person, place, and time.  Skin: Skin is warm and dry.  Psychiatric: He has a normal mood and affect. His behavior is normal.    ED Course  Procedures (including critical care time)  DIAGNOSTIC STUDIES: Oxygen  Saturation is 96% on room air, adequate by my interpretation.    COORDINATION OF CARE:  1:09PM - EDMD reviewed CXR results and are negative; denies Cp; prednsione and other breathing tx will be ordered for the pt.   Labs Reviewed - No data to display Dg Chest 2 View  08/24/2011  *RADIOLOGY REPORT*  Clinical Data: Shortness of breath  CHEST - 2 VIEW  Comparison: 04/02/2011  Findings: Lungs are clear. The cardiopericardial silhouette is within normal limits for size. Imaged  bony structures of the thorax are intact.  IMPRESSION: Stable.  No acute cardiopulmonary process.  Original Report Authenticated By: ERIC A. MANSELL, M.D.     1. COPD exacerbation       MDM  Patient did not have any signs of pneumonia on chest x-ray. His symptoms were consistent with COPD exacerbation. He has not been recently treated with prednisone and he has had some improvement with breathing treatments. Patient did not have any chest pain or peripheral edema. Patient was written for CDU admission orders. He was given a dose of prednisone and breathing treatment as well as wheeze protocol was ordered. Patient had home Symbicort and Wellbutrin ordered as well. He has been on the Wellbutrin for smoking cessation. Patient was transferred to CDU for admission to CDU wheeze protocol.  I personally performed the services described in this documentation, which was scribed in my presence. The recorded information has been reviewed and considered.          Cyndra Numbers, MD 08/24/11 1323

## 2011-08-24 NOTE — ED Provider Notes (Signed)
Assumed care of patient in the CDU from Dr. Alto Denver.  Patient with a history of COPD comes in today with a chief complaint of shortness of breath and wheezing.  Pt recently ran out of his Symbicort inhaler.  Patient is currently on the bronchospasm protocol.  Patient has been given 60 mg of Prednisone and breathing treatment.  Patient currently receiving an hour long neb treatment.  No acute distress at this time  6:30 PM Reassessed patient.  Patient reports that he is feeling much better after his hour long neb treatment.  No acute distress.  Patient able to speak in complete sentences.  Not using accessory muscles to breathe.  Lungs slightly diminished breath sounds throughout lungs. Bilateral expiratory wheezing at the bases of both lungs.  Will ambulate patient with pulse ox to assess oxygen sat with ambulation.Marland Kitchen 7:15 PM Patient's oxygen remained above 93 with ambulation.  Patient reports slight shortness of breath with ambulation.  Patient able to speak in complete sentences after ambulation.  No acute respiratory distress.   8:38 PM Patient currently getting another breathing treatment. 10:05 PM Patient reports that he is feeling much better at this time.  VSS.  Pulse ox 98 on room air.  Patient is requesting to be discharged.  No acute respiratory distress. Able to speak in full sentences.  No use of accessory muscles.  Lungs moderate expiratory wheezing at bases of both lungs, but much improved.  Will discharge patient home with Rx for Symbicort inhaler and Prednisone.    Pascal Lux [place], PA-C 08/25/11 0206

## 2011-08-24 NOTE — Discharge Instructions (Signed)
Followup with your doctor in regards to your COPD exacerbation. Take your Symbicort inhaler and your Prednisone as directed. Your more than welcome to return to the emergency department if you become short of breath, your albuterol inhaler did not relieve symptoms, you are having chest pain associated with difficulty breathing, or any symptoms that are concerning to you.

## 2011-08-24 NOTE — ED Notes (Signed)
D/c instructions reviewed w/ pt - pt denies any further questions or concerns at present.   

## 2011-08-24 NOTE — ED Notes (Signed)
Pt states that he knows why he is sick.  States that it is because he is out of symbicort and the [place] will not send him another one due to it not being time and pt states that he can not afford to buy a inhaler as it is $64

## 2011-08-27 NOTE — ED Provider Notes (Signed)
Medical screening examination/treatment/procedure(s) were conducted as a shared visit with non-physician practitioner(s) and myself.  I personally evaluated the patient during the encounter  See my initial note for full H and P  Cyndra Numbers, MD 08/27/11 [phone]

## 2012-02-01 ENCOUNTER — Emergency Department (HOSPITAL_COMMUNITY)
Admission: EM | Admit: 2012-02-01 | Discharge: 2012-02-01 | Disposition: A | Discharge status: Home / Self Care | Payer: Non-veteran care | Attending: Emergency Medicine | Admitting: Emergency Medicine

## 2012-02-01 ENCOUNTER — Encounter (HOSPITAL_COMMUNITY): Payer: Self-pay | Admitting: Emergency Medicine

## 2012-02-01 DIAGNOSIS — J438 Other emphysema: Secondary | ICD-10-CM | POA: Insufficient documentation

## 2012-02-01 DIAGNOSIS — R220 Localized swelling, mass and lump, head: Secondary | ICD-10-CM

## 2012-02-01 DIAGNOSIS — F17200 Nicotine dependence, unspecified, uncomplicated: Secondary | ICD-10-CM | POA: Insufficient documentation

## 2012-02-01 DIAGNOSIS — K089 Disorder of teeth and supporting structures, unspecified: Secondary | ICD-10-CM

## 2012-02-01 MED ORDER — NAPROXEN 500 MG PO TABS: 500.0000 mg | ORAL_TABLET | Freq: Two times a day (BID) | ORAL | Status: DC

## 2012-02-01 MED ORDER — NAPROXEN 250 MG PO TABS
500.0000 mg | ORAL_TABLET | Freq: Two times a day (BID) | ORAL | Status: DC
  Administered 2012-02-01: 500 mg via ORAL
  Filled 2012-02-01: qty 2

## 2012-02-01 MED ORDER — PENICILLIN V POTASSIUM 500 MG PO TABS: 500.0000 mg | ORAL_TABLET | Freq: Four times a day (QID) | ORAL | Status: DC

## 2012-02-01 MED ORDER — PENICILLIN V POTASSIUM 250 MG PO TABS
500.0000 mg | ORAL_TABLET | Freq: Four times a day (QID) | ORAL | Status: DC
  Administered 2012-02-01: 500 mg via ORAL
  Filled 2012-02-01: qty 2

## 2012-02-01 NOTE — ED Notes (Signed)
Pt taken to waiting room to wait for prescriptions to be filled

## 2012-02-01 NOTE — ED Notes (Signed)
Rt side of face swelling had a tooth pulled 9/27 and swelling started then

## 2012-02-01 NOTE — Progress Notes (Signed)
   CARE MANAGEMENT ED NOTE 02/01/2012  Patient:  Gerald Williams, Gerald Williams   Account Number:  [id number]  Date Initiated:  02/01/2012  Documentation initiated by:  Fransico Michael  Subjective/Objective Assessment:   presented to ED with c/o facial swelling     Subjective/Objective Assessment Detail:     Action/Plan:   Action/Plan Detail:   Anticipated DC Date:  02/01/2012     Status Recommendation to Physician:   Result of Recommendation:      DC Planning Services  CM consult  Medication Assistance    Choice offered to / List presented to:            Status of service:    ED Comments:   ED Comments Detail:  02/01/12-1347-J.Royce Stegman,RN,BSN [phone]      Received call from ED RN with request for medication assistance for antibiotics for patient. Main pharmacy called to inquire about elligibility for [facility] fund. Patient is elligible. Prescriptions for antibiotics x2  sent to main pharmacy with request to call RN at [phone] when ready. No further needs identified.

## 2012-02-01 NOTE — ED Notes (Signed)
Reports had left upper molar extracted at the [place] 01-20-12, was not given antibiotics. Since then swelling & pain worsened. Now c/o chills, swelling spread to entire left side face & affecting eye sight

## 2012-02-01 NOTE — ED Provider Notes (Signed)
History   This chart was scribed for Celene Kras, MD by Charolett Bumpers . The patient was seen in room TR09C/TR09C. Patient's care was started at 1305.  CSN: [id number] Arrival date & time 02/01/12  1109  None    No chief complaint on file.   The history is provided by the patient. No language interpreter was used.   Gerald Williams is a 61 y.o. male who presents to the Emergency Department complaining of constant, moderate right-sided facial swelling. He states that he had teeth extracted on 9/27. He states that the swelling started on 9/28. He reports that he went to [place] yesterday and told to take Advil and use warm compresses. He states that he has not followed up with a dentist. He reports associated chills. He denies any fevers or vomiting.    Past Medical History  Diagnosis Date  . Emphysema     History reviewed. No pertinent past surgical history.  No family history on file.  History  Substance Use Topics  . Smoking status: Current Every Day Smoker -- 0.5 packs/day  . Smokeless tobacco: Not on file  . Alcohol Use: No      Review of Systems  Constitutional: Positive for chills. Negative for fever.  HENT: Positive for facial swelling.   Respiratory: Negative for shortness of breath.   Gastrointestinal: Negative for nausea and vomiting.  Neurological: Negative for weakness.  All other systems reviewed and are negative.    Allergies  Review of patient's allergies indicates no known allergies.  Home Medications   Current Outpatient Rx  Name Route Sig Dispense Refill  . ALBUTEROL SULFATE (2.5 MG/3ML) 0.083% IN NEBU Nebulization Take 2.5 mg by nebulization 4 (four) times daily.      Maximino Greenland 18-103 MCG/ACT IN AERO Inhalation Inhale into the lungs every 6 (six) hours as needed. For wheezing     . BUDESONIDE-FORMOTEROL FUMARATE 160-4.5 MCG/ACT IN AERO Inhalation Inhale 2 puffs into the lungs 2 (two) times daily.      . BUPROPION HCL ER (SMOKING  DET) 150 MG PO TB12 Oral Take 150 mg by mouth 2 (two) times daily. For stop smoking      BP 101/67  Pulse 74  Temp 98 F (36.7 C) (Oral)  Resp 12  SpO2 95%  Physical Exam  Nursing note and vitals reviewed. Constitutional: He appears well-developed and well-nourished. No distress.  HENT:  Head: Normocephalic and atraumatic.  Right Ear: External ear normal.  Left Ear: External ear normal.       Mild to moderate facial edema with some induration. No erythema around left maxillary region. Status post dental extraction, no drainage noted.   Eyes: Conjunctivae normal are normal. Right eye exhibits no discharge. Left eye exhibits no discharge. No scleral icterus.  Neck: Neck supple. No tracheal deviation present.       No swelling in neck.   Cardiovascular: Normal rate.   Pulmonary/Chest: Effort normal. No stridor. No respiratory distress.  Musculoskeletal: He exhibits no edema.  Neurological: He is alert. Cranial nerve deficit: no gross deficits.  Skin: Skin is warm and dry. No rash noted.  Psychiatric: He has a normal mood and affect.    ED Course  Procedures (including critical care time)  DIAGNOSTIC STUDIES: Oxygen Saturation is 95% on room air, normal by my interpretation.    COORDINATION OF CARE:  13:08-Discussed planned course of treatment with the patient including antibiotic and f/u with dentist on call, who is agreeable at this time.  Labs Reviewed - No data to display No results found.   1. Facial swelling   2. Dental disorder       MDM  I explained the importance to the patient and following up with a dentist. I will give him the name of the dentist on call. He will also try to followup with a dentist through the [place]. I prescribed antibiotics in case she is developing early component of infection although it is very possible that the swelling may be related to postoperative swelling from his dental extraction   I personally performed the services  described in this documentation, which was scribed in my presence.  The recorded information has been reviewed and considered.      Celene Kras, MD 02/01/12 2021

## 2013-01-06 ENCOUNTER — Encounter (HOSPITAL_COMMUNITY): Payer: Self-pay | Admitting: Nurse Practitioner

## 2013-01-06 ENCOUNTER — Emergency Department (HOSPITAL_COMMUNITY)
Admission: EM | Admit: 2013-01-06 | Discharge: 2013-01-06 | Disposition: A | Discharge status: Home / Self Care | Payer: Non-veteran care | Attending: Emergency Medicine | Admitting: Emergency Medicine

## 2013-01-06 ENCOUNTER — Emergency Department (HOSPITAL_COMMUNITY): Payer: Non-veteran care

## 2013-01-06 DIAGNOSIS — R079 Chest pain, unspecified: Secondary | ICD-10-CM

## 2013-01-06 DIAGNOSIS — J4489 Other specified chronic obstructive pulmonary disease: Secondary | ICD-10-CM | POA: Insufficient documentation

## 2013-01-06 DIAGNOSIS — Z8674 Personal history of sudden cardiac arrest: Secondary | ICD-10-CM | POA: Insufficient documentation

## 2013-01-06 DIAGNOSIS — R071 Chest pain on breathing: Secondary | ICD-10-CM | POA: Insufficient documentation

## 2013-01-06 DIAGNOSIS — J449 Chronic obstructive pulmonary disease, unspecified: Secondary | ICD-10-CM | POA: Insufficient documentation

## 2013-01-06 DIAGNOSIS — Z791 Long term (current) use of non-steroidal anti-inflammatories (NSAID): Secondary | ICD-10-CM | POA: Insufficient documentation

## 2013-01-06 DIAGNOSIS — R059 Cough, unspecified: Secondary | ICD-10-CM | POA: Insufficient documentation

## 2013-01-06 DIAGNOSIS — F17200 Nicotine dependence, unspecified, uncomplicated: Secondary | ICD-10-CM | POA: Insufficient documentation

## 2013-01-06 DIAGNOSIS — R319 Hematuria, unspecified: Secondary | ICD-10-CM | POA: Insufficient documentation

## 2013-01-06 DIAGNOSIS — IMO0002 Reserved for concepts with insufficient information to code with codable children: Secondary | ICD-10-CM | POA: Insufficient documentation

## 2013-01-06 DIAGNOSIS — Z79899 Other long term (current) drug therapy: Secondary | ICD-10-CM | POA: Insufficient documentation

## 2013-01-06 DIAGNOSIS — R05 Cough: Secondary | ICD-10-CM | POA: Insufficient documentation

## 2013-01-06 DIAGNOSIS — R350 Frequency of micturition: Secondary | ICD-10-CM | POA: Insufficient documentation

## 2013-01-06 DIAGNOSIS — R632 Polyphagia: Secondary | ICD-10-CM | POA: Insufficient documentation

## 2013-01-06 LAB — POCT I-STAT TROPONIN I: Troponin i, poc: 0 ng/mL (ref 0.00–0.08)

## 2013-01-06 LAB — BASIC METABOLIC PANEL
BUN: 17 mg/dL (ref 6–23)
Calcium: 8.9 mg/dL (ref 8.4–10.5)
GFR calc Af Amer: 51 mL/min — ABNORMAL LOW (ref >90)
GFR calc non Af Amer: 44 mL/min — ABNORMAL LOW (ref >90)
Potassium: 4.2 mEq/L (ref 3.5–5.1)

## 2013-01-06 LAB — RAPID URINE DRUG SCREEN, HOSP PERFORMED
Barbiturates: NOT DETECTED
Cocaine: POSITIVE — AB

## 2013-01-06 LAB — CBC
HCT: 43.4 % (ref 39.0–52.0)
MCHC: 34.6 g/dL (ref 30.0–36.0)
RDW: 14 % (ref 11.5–15.5)

## 2013-01-06 LAB — URINALYSIS, ROUTINE W REFLEX MICROSCOPIC
Bilirubin Urine: NEGATIVE
Ketones, ur: NEGATIVE mg/dL
Nitrite: NEGATIVE
Protein, ur: NEGATIVE mg/dL
Specific Gravity, Urine: 1.017 (ref 1.005–1.030)
Urobilinogen, UA: 0.2 mg/dL (ref 0.0–1.0)

## 2013-01-06 MED ORDER — HYDROCODONE-ACETAMINOPHEN 5-325 MG PO TABS
2.0000 | ORAL_TABLET | Freq: Once | ORAL | Status: AC
  Administered 2013-01-06: 2 via ORAL
  Filled 2013-01-06: qty 2

## 2013-01-06 MED ORDER — HYDROCODONE-ACETAMINOPHEN 5-325 MG PO TABS: 1.0000 | ORAL_TABLET | Freq: Four times a day (QID) | ORAL | Status: DC | PRN

## 2013-01-06 NOTE — ED Notes (Addendum)
Pt c/o L sided CP and "pins and needles stabbing me in the feet" for past 2 weeks. Pt reports the CP "feels like a rock hitting me in the chest." CP has been intermittent and does not feel SOB or diaphoretic. Pt is A&Ox4, resp e/u, ambulatory. Recently told by PCP that he had "borderline diabetes."

## 2013-01-06 NOTE — ED Provider Notes (Signed)
CSN: [id number]     Arrival date & time 01/06/13  1041 History   First MD Initiated Contact with Patient 01/06/13 1108     Chief Complaint  Patient presents with  . Chest Pain   (Consider location/radiation/quality/duration/timing/severity/associated sxs/prior Treatment) HPI Comments: Patient with hx of COPD exacerbation and cocaine abuse prexents with CP. Gradual onset over the past 3 weeks. Located in the left lateral chest wall. Worsened when lying on the left side, pleuritic, movement of left arm, and after duoneb treatments.  Last cocaine use on Friday. CUrrent daily smoker, Patient states he had "slight Heart attack" 15 years ago. Denies fevers, chills, myalgias, arthralgias. . Denies dysuria, flank pain, suprapubic pain, frequency, urgency, or hematuria. Denies headaches, light headedness, weakness, visual disturbances. Denies abdominal pain, nausea, vomiting, diarrhea or constipation.    Patient is a 62 y.o. male presenting with chest pain. The history is provided by the patient and medical records. No language interpreter was used.  Chest Pain Pain location:  L lateral chest Pain quality: aching   Pain quality comment:  Pleuritic Pain radiates to:  Does not radiate Pain severity now: moderate, severe at times. Onset quality:  Gradual Duration:  3 weeks Timing:  Constant Progression:  Unchanged Chronicity:  New Context: breathing and raising an arm   Context comment:  Lying on the left side Relieved by:  Nothing Associated symptoms: cough   Associated symptoms: no abdominal pain, no AICD problem, no altered mental status, no anorexia, no anxiety, no back pain, no claudication, no diaphoresis, no dizziness, no dysphagia, no fatigue, no fever, no headache, no heartburn, no lower extremity edema, no nausea, no near-syncope, no numbness, no orthopnea, no palpitations, no PND, no shortness of breath, no syncope, not vomiting and no weakness   Associated symptoms comment:  Denies  DOE, SOB, chest tightness or pressure, radiation to left arm, jaw or back, nausea,vomiting or diaphoresis.    Past Medical History  Diagnosis Date  . Emphysema    History reviewed. No pertinent past surgical history. History reviewed. No pertinent family history. History  Substance Use Topics  . Smoking status: Current Every Day Smoker -- 0.50 packs/day  . Smokeless tobacco: Not on file  . Alcohol Use: No    Review of Systems  Constitutional: Negative for fever, diaphoresis and fatigue.  HENT: Negative for trouble swallowing.   Respiratory: Positive for cough. Negative for chest tightness, shortness of breath and wheezing.   Cardiovascular: Positive for chest pain. Negative for palpitations, orthopnea, claudication, leg swelling, syncope, PND and near-syncope.  Gastrointestinal: Negative for heartburn, nausea, vomiting, abdominal pain and anorexia.  Endocrine: Positive for polyphagia (diagnosed as pre diabetic with HgbA1c of 6.3).  Genitourinary: Positive for frequency and hematuria.  Musculoskeletal: Negative for myalgias, back pain and arthralgias.  Skin: Negative for rash.  Neurological: Negative for dizziness, weakness, numbness and headaches.    Allergies  Review of patient's allergies indicates no known allergies.  Home Medications   Current Outpatient Rx  Name  Route  Sig  Dispense  Refill  . albuterol (PROVENTIL) (2.5 MG/3ML) 0.083% nebulizer solution   Nebulization   Take 2.5 mg by nebulization 4 (four) times daily.           Marland Kitchen albuterol-ipratropium (COMBIVENT) 18-103 MCG/ACT inhaler   Inhalation   Inhale into the lungs every 6 (six) hours as needed. For wheezing          . budesonide-formoterol (SYMBICORT) 160-4.5 MCG/ACT inhaler   Inhalation   Inhale 2 puffs  into the lungs 2 (two) times daily.           Marland Kitchen buPROPion (ZYBAN) 150 MG 12 hr tablet   Oral   Take 150 mg by mouth 2 (two) times daily. For stop smoking         . naproxen (NAPROSYN) 500 MG  tablet   Oral   Take 1 tablet (500 mg total) by mouth 2 (two) times daily with a meal.   30 tablet   0   . penicillin v potassium (VEETID) 500 MG tablet   Oral   Take 1 tablet (500 mg total) by mouth every 6 (six) hours.   40 tablet   0    BP 97/69  Pulse 65  Temp(Src) 97.6 F (36.4 C)  Resp 18  SpO2 95% Physical Exam  Nursing note and vitals reviewed. Constitutional: He appears well-developed and well-nourished. No distress.  HENT:  Head: Normocephalic and atraumatic.  Eyes: Conjunctivae are normal. No scleral icterus.  Neck: Normal range of motion. Neck supple.  Cardiovascular: Normal rate, regular rhythm and normal heart sounds.   Pulmonary/Chest: Effort normal. No respiratory distress. He has no wheezes. He has no rales. He exhibits tenderness (ttp lateral chest wall.).  Abdominal: Soft. There is no tenderness.  Musculoskeletal: He exhibits no edema.  Neurological: He is alert.  Skin: Skin is warm and dry. He is not diaphoretic.  Psychiatric: His behavior is normal.    ED Course  Procedures (including critical care time) Labs Review Labs Reviewed  CBC  BASIC METABOLIC PANEL  URINALYSIS, ROUTINE W REFLEX MICROSCOPIC  URINE RAPID DRUG SCREEN (HOSP PERFORMED)   Imaging Review Dg Chest 2 View  01/06/2013   CLINICAL DATA:  Chest pain, emphysema  EXAM: CHEST  2 VIEW  COMPARISON:  08/24/2011  FINDINGS: The heart size and mediastinal contours are within normal limits. Both lungs are clear. The visualized skeletal structures are unremarkable. Mild hyperinflation.  IMPRESSION: No active cardiopulmonary disease.  Mild hyperinflation.   Electronically Signed   By: Natasha Mead   On: 01/06/2013 11:21   ECG interpretation   Date: 01/10/2013  Rate: 54  Rhythm: normal sinus rhythm  QRS Axis: normal  Intervals: normal  ST/T Wave abnormalities: normal  Conduction Disutrbances: none  Narrative Interpretation:   Old EKG Reviewed: No significant changes noted    MDM   1.  Chest pain     11:44 AM  BP 97/69  Pulse 65  Temp(Src) 97.6 F (36.4 C)  Resp 18  SpO2 95% Patient with hx copd and cocaine abuse with recent use. Doubt PE/ACS as sxs seem consistent with musculoskeltal pain.Chest peain is reproducible with palpation. No hypoxia, hemoptysis, calf swelling. Wells low risk.  Patient labs show elevated creatinine csistent with previous,  EKG doe snot show signs of acute ischeminc change. Patient urine pos for cocaine . Neg troponin. cxr shows no acute abnormality. Patient pain is reproducible with palpation.   Patient is to be discharged with recommendation to follow up with PCP in regards to today's hospital visit. Chest pain is not likely of cardiac or pulmonary etiology. VSS, no tracheal deviation, no JVD or new murmur, RRR, breath sounds equal bilaterally, EKG without acute abnormalities, negative troponin, and negative CXR. Return to the ED is CP becomes exertional, associated with diaphoresis or nausea, radiates to left jaw/arm, worsens or becomes concerning in any way. Pt appears reliable for follow up and is agreeable to discharge.   Case has been discussed with and seen  by Dr. Jeraldine Loots who agrees with the above plan to discharge.     Arthor Captain, PA-C 01/10/13 2017

## 2013-01-10 NOTE — ED Provider Notes (Signed)
  This was a shared visit with a mid-level provided (NP or PA).  Throughout the patient's course I was available for consultation/collaboration.  I saw the ECG (if appropriate), relevant labs and studies - I agree with the interpretation.  On my exam the patient was in no distress.  He was discharged to follow up with his physician      Gerhard Munch, MD 01/10/13 2314

## 2013-04-05 ENCOUNTER — Emergency Department (HOSPITAL_COMMUNITY): Payer: Non-veteran care

## 2013-04-05 ENCOUNTER — Encounter (HOSPITAL_COMMUNITY): Payer: Self-pay | Admitting: Emergency Medicine

## 2013-04-05 ENCOUNTER — Emergency Department (HOSPITAL_COMMUNITY)
Admission: EM | Admit: 2013-04-05 | Discharge: 2013-04-06 | Disposition: A | Discharge status: Home / Self Care | Payer: Non-veteran care | Attending: Emergency Medicine | Admitting: Emergency Medicine

## 2013-04-05 DIAGNOSIS — Z8709 Personal history of other diseases of the respiratory system: Secondary | ICD-10-CM | POA: Insufficient documentation

## 2013-04-05 DIAGNOSIS — F17200 Nicotine dependence, unspecified, uncomplicated: Secondary | ICD-10-CM | POA: Insufficient documentation

## 2013-04-05 DIAGNOSIS — M549 Dorsalgia, unspecified: Secondary | ICD-10-CM

## 2013-04-05 DIAGNOSIS — Z79899 Other long term (current) drug therapy: Secondary | ICD-10-CM | POA: Insufficient documentation

## 2013-04-05 DIAGNOSIS — Z859 Personal history of malignant neoplasm, unspecified: Secondary | ICD-10-CM | POA: Insufficient documentation

## 2013-04-05 DIAGNOSIS — M545 Low back pain, unspecified: Secondary | ICD-10-CM | POA: Insufficient documentation

## 2013-04-05 MED ORDER — HYDROMORPHONE HCL PF 1 MG/ML IJ SOLN
1.0000 mg | Freq: Once | INTRAMUSCULAR | Status: AC
  Administered 2013-04-05: 1 mg via INTRAVENOUS
  Filled 2013-04-05: qty 1

## 2013-04-05 MED ORDER — DIAZEPAM 5 MG PO TABS
5.0000 mg | ORAL_TABLET | Freq: Once | ORAL | Status: AC
  Administered 2013-04-05: 5 mg via ORAL
  Filled 2013-04-05: qty 1

## 2013-04-05 NOTE — ED Notes (Signed)
Pt states he has taken 8 200mg  Advil today

## 2013-04-05 NOTE — ED Notes (Signed)
Bed: AO13 Expected date:  Expected time:  Means of arrival:  Comments: EMS/62 yo male with lower back spasms

## 2013-04-05 NOTE — ED Notes (Signed)
Lower back spasm throughout today and getting worse as times goes on

## 2013-04-06 MED ORDER — HYDROCODONE-ACETAMINOPHEN 5-325 MG PO TABS: 1.0000 | ORAL_TABLET | ORAL | Status: DC | PRN

## 2013-04-06 MED ORDER — CYCLOBENZAPRINE HCL 10 MG PO TABS: 10.0000 mg | ORAL_TABLET | Freq: Two times a day (BID) | ORAL | Status: DC | PRN

## 2013-04-06 NOTE — ED Provider Notes (Signed)
CSN: [id number]     Arrival date & time 04/05/13  2317 History   First MD Initiated Contact with Patient 04/05/13 2319     Chief Complaint  Patient presents with  . Back Pain    HPI Patient reports worsening back pain over the past 12-24 hours.  If fevers or chills.  History IV drug abuse.  History of cancer.  He states his pain is located in his bilateral lower lumbar region with some radiation down into both of his thighs.  No difficulty urinating.  No bowel abnormalities.  No description of saddle anesthesia.  No fevers or chills.  Pain is moderate in severity.  Ibuprofen today without improvement in his symptoms.  Pain worse by movement and palpation of his low back.  No rash.  Denies weakness of his lower extremities.  No recent injury or trauma.  No heavy lifting.   Past Medical History  Diagnosis Date  . Emphysema    History reviewed. No pertinent past surgical history. No family history on file. History  Substance Use Topics  . Smoking status: Current Every Day Smoker -- 0.50 packs/day  . Smokeless tobacco: Not on file  . Alcohol Use: No    Review of Systems  All other systems reviewed and are negative.    Allergies  Review of patient's allergies indicates no known allergies.  Home Medications   Current Outpatient Rx  Name  Route  Sig  Dispense  Refill  . albuterol (PROVENTIL HFA;VENTOLIN HFA) 108 (90 BASE) MCG/ACT inhaler   Inhalation   Inhale 2 puffs into the lungs every 6 (six) hours as needed for shortness of breath.         Marland Kitchen albuterol (PROVENTIL) (2.5 MG/3ML) 0.083% nebulizer solution   Nebulization   Take 2.5 mg by nebulization every 4 (four) hours as needed for wheezing or shortness of breath.          Marland Kitchen albuterol-ipratropium (COMBIVENT) 18-103 MCG/ACT inhaler   Inhalation   Inhale 1 puff into the lungs every 6 (six) hours as needed for wheezing. For wheezing         . budesonide-formoterol (SYMBICORT) 160-4.5 MCG/ACT inhaler   Inhalation  Inhale 2 puffs into the lungs 2 (two) times daily.           Marland Kitchen ibuprofen (ADVIL,MOTRIN) 200 MG tablet   Oral   Take 800 mg by mouth every 8 (eight) hours as needed for headache or moderate pain.         . vitamin B-12 (CYANOCOBALAMIN) 1000 MCG tablet   Oral   Take 1,000 mcg by mouth every morning.         . cyclobenzaprine (FLEXERIL) 10 MG tablet   Oral   Take 1 tablet (10 mg total) by mouth 2 (two) times daily as needed for muscle spasms.   20 tablet   0   . HYDROcodone-acetaminophen (NORCO/VICODIN) 5-325 MG per tablet   Oral   Take 1 tablet by mouth every 4 (four) hours as needed for moderate pain.   15 tablet   0    BP 99/61  Pulse 67  Temp(Src) 97.8 F (36.6 C) (Oral)  Resp 26  SpO2 96% Physical Exam  Nursing note and vitals reviewed. Constitutional: He is oriented to person, place, and time. He appears well-developed and well-nourished.  HENT:  Head: Normocephalic and atraumatic.  Eyes: EOM are normal.  Neck: Normal range of motion.  Cardiovascular: Normal rate, regular rhythm, normal heart sounds and intact distal  pulses.   Pulmonary/Chest: Effort normal and breath sounds normal. No respiratory distress.  Abdominal: Soft. He exhibits no distension. There is no tenderness.  Musculoskeletal: Normal range of motion.  5 Out of 5 strength in major muscle groups of bilateral lower extremities.  Mild paralumbar tenderness without obvious spasm.  No point tenderness across his thoracic or lumbar spine regions  Neurological: He is alert and oriented to person, place, and time.  Skin: Skin is warm and dry.  Psychiatric: He has a normal mood and affect. Judgment normal.    ED Course  Procedures (including critical care time) Labs Review Labs Reviewed - No data to display Imaging Review Dg Lumbar Spine Complete  04/06/2013   CLINICAL DATA:  Lower back pain for 1 day.  EXAM: LUMBAR SPINE - COMPLETE 4+ VIEW  COMPARISON:  None.  FINDINGS: There is no evidence of  fracture or subluxation. Vertebral bodies demonstrate normal height and alignment. Intervertebral disc spaces are preserved. The visualized neural foramina are grossly unremarkable in appearance.  The visualized bowel gas pattern is unremarkable in appearance; air and stool are noted within the colon. The sacroiliac joints are within normal limits.  IMPRESSION: No evidence of fracture or subluxation along the lumbar spine.   Electronically Signed   By: Roanna Raider M.D.   On: 04/06/2013 00:23  I personally reviewed the imaging tests through PACS system I reviewed available ER/hospitalization records through the EMR   EKG Interpretation   None       MDM   1. Back pain    Patient feels much better after pain medicine.  Discharge home in good condition  Normal lower extremity neurologic exam. No bowel or bladder complaints. Likely musculoskeletal back pain. Doubt spinal epidural abscess. Doubt cauda equina. Doubt abdominal aortic aneurysm     Lyanne Co, MD 04/06/13 [phone]

## 2013-10-28 ENCOUNTER — Telehealth (HOSPITAL_COMMUNITY): Payer: Self-pay | Admitting: *Deleted

## 2013-11-11 ENCOUNTER — Telehealth (HOSPITAL_COMMUNITY): Payer: Self-pay

## 2013-12-16 ENCOUNTER — Ambulatory Visit (HOSPITAL_COMMUNITY): Payer: Non-veteran care

## 2013-12-23 ENCOUNTER — Encounter (HOSPITAL_COMMUNITY)
Admission: RE | Admit: 2013-12-23 | Discharge: 2013-12-23 | Disposition: A | Discharge status: Home / Self Care | Payer: Non-veteran care | Source: Ambulatory Visit | Attending: Internal Medicine | Admitting: Internal Medicine

## 2013-12-23 ENCOUNTER — Encounter (HOSPITAL_COMMUNITY): Payer: Self-pay

## 2013-12-23 VITALS — BP 114/79 | HR 79 | Resp 18 | Ht 66.25 in | Wt 170.9 lb

## 2013-12-23 DIAGNOSIS — J438 Other emphysema: Secondary | ICD-10-CM

## 2013-12-23 DIAGNOSIS — F17200 Nicotine dependence, unspecified, uncomplicated: Secondary | ICD-10-CM | POA: Diagnosis present

## 2013-12-23 DIAGNOSIS — J441 Chronic obstructive pulmonary disease with (acute) exacerbation: Secondary | ICD-10-CM | POA: Insufficient documentation

## 2013-12-23 DIAGNOSIS — R079 Chest pain, unspecified: Secondary | ICD-10-CM | POA: Diagnosis not present

## 2013-12-23 HISTORY — DX: Other psychoactive substance abuse, uncomplicated: F19.10

## 2013-12-23 NOTE — Progress Notes (Signed)
Gerald Williams 63 y.o. male Pulmonary Rehab Orientation Note Patient arrived today in [facility] for orientation to Pulmonary Rehab. He ambulated from the bus stop to the cardiac rehab department with 2 short rest breaks for SOB. He does not carry portable oxygen and has not been prescribed oxygen at night. Color good, skin warm and dry. Patient is oriented to time and place. Patient's medical history and medications reviewed. Heart rate is normal, breath sounds clear to auscultation, no wheezes or rales; rhonci noted but clears with cough. Grip strength equal, strong. Distal pulses palpable. No edema noted. Patient reports he does take medications as prescribed. Patient states he follows a "low sugar" diet in order to control his borderline diabetes. The patient reports no specific efforts to gain or lose weight. He does state that he has gained "quite a few pounds" since he stopped smoking 3 months ago. Patient's weight will be monitored closely. Demonstration and practice of PLB using pulse oximeter. Patient able to return demonstration satisfactorily. Safety and hand hygiene in the exercise area reviewed with patient. Patient voices understanding of the information reviewed. Department expectations discussed with patient and achievable goals were set. The patient shows enthusiasm about attending the program and we look forward to working with this nice Williams. The patient is scheduled for a 6 min walk test on Tuesday 9/8 at 3:30 and to begin exercise on Thursday 9/10 at 10:30. Patient also interested in advanced directives. He was given a packet to read over, and an appointment was scheduled for him with pastoral care on 9/8 to complete the directive.  45 minutes was spent on a variety of activities such as assessment of the patient, obtaining baseline data including height, weight, BMI, and grip strength, verifying medical history, allergies, and current medications, and teaching patient  strategies for performing tasks with less respiratory effort with emphasis on pursed lip breathing.

## 2013-12-31 ENCOUNTER — Encounter (HOSPITAL_COMMUNITY)
Admission: RE | Admit: 2013-12-31 | Discharge: 2013-12-31 | Disposition: A | Discharge status: Home / Self Care | Payer: Non-veteran care | Source: Ambulatory Visit | Attending: Internal Medicine | Admitting: Internal Medicine

## 2013-12-31 DIAGNOSIS — R079 Chest pain, unspecified: Secondary | ICD-10-CM | POA: Insufficient documentation

## 2013-12-31 DIAGNOSIS — F17200 Nicotine dependence, unspecified, uncomplicated: Secondary | ICD-10-CM | POA: Insufficient documentation

## 2013-12-31 DIAGNOSIS — J441 Chronic obstructive pulmonary disease with (acute) exacerbation: Secondary | ICD-10-CM | POA: Diagnosis present

## 2013-12-31 NOTE — Progress Notes (Signed)
[name] completed a Six-Minute Walk Test on 12/31/13 . [name] walked 1,244 feet with 0 breaks.  The patient's lowest oxygen saturation was 92% , highest heart rate was 112bpm , and highest blood pressure was 110/80. The patient was on room air.  Gerald Williams stated that overall fatigue hindered their walk test.

## 2014-01-02 ENCOUNTER — Encounter (HOSPITAL_COMMUNITY)
Admission: RE | Admit: 2014-01-02 | Discharge: 2014-01-02 | Disposition: A | Discharge status: Home / Self Care | Payer: Non-veteran care | Source: Ambulatory Visit | Attending: Internal Medicine | Admitting: Internal Medicine

## 2014-01-02 DIAGNOSIS — J441 Chronic obstructive pulmonary disease with (acute) exacerbation: Secondary | ICD-10-CM | POA: Diagnosis not present

## 2014-01-02 NOTE — Progress Notes (Signed)
Today, Gerald Williams exercised at [employer]. Service time was from 10:30 to 12:30.  The patient exercised for more than 31 minutes performing aerobic, strengthening, and stretching exercises. Oxygen saturation, heart rate, blood pressure, rate of perceived exertion, and shortness of breath were all monitored before, during, and after exercise. Gerald Williams presented with no problems at today's exercise session.  The patient attended education class on Nutrition with Mickle Plumb. There was no workload change during today's exercise session.  Pre-exercise vitals:   Weight kg: 76.4   Liters of O2: ra   SpO2: 94   HR: 78   BP: 100/68   CBG: na  Exercise vitals:   Highest heartrate:  103   Lowest oxygen saturation: 86% increased to 92%   Highest blood pressure: 106/76   Liters of 02: ra  Post-exercise vitals:   SpO2: 95   HR: 98   BP: 104/60   Liters of O2: ra   CBG: na  Dr. Kalman Shan, Medical Director Dr. Vanessa Barbara is immediately available during today's Pulmonary Rehab session for Gerald Williams on 01/02/14 at 10:30am class time.

## 2014-01-07 ENCOUNTER — Encounter (HOSPITAL_COMMUNITY)
Admission: RE | Admit: 2014-01-07 | Discharge: 2014-01-07 | Disposition: A | Discharge status: Home / Self Care | Payer: Non-veteran care | Source: Ambulatory Visit | Attending: Internal Medicine | Admitting: Internal Medicine

## 2014-01-07 DIAGNOSIS — J441 Chronic obstructive pulmonary disease with (acute) exacerbation: Secondary | ICD-10-CM | POA: Diagnosis not present

## 2014-01-07 NOTE — Progress Notes (Signed)
Today, Gerald Williams exercised at [employer]. Service time was from 1030 to 1215.  The patient exercised for more than 31 minutes performing aerobic, strengthening, and stretching exercises. Oxygen saturation, heart rate, blood pressure, rate of perceived exertion, and shortness of breath were all monitored before, during, and after exercise. Gerald Williams presented with no problems today, however he desaturated while exercising on the nustep and did not recover with PLB. Patient place on 2L O2. Denies complaints or new respiratory illness symptoms. Encouraged patient to notify MD if new symptoms arise. At rest patients saturations continue to remain >90%.   There was no workload change during today's exercise session.  Pre-exercise vitals:   Weight kg: 76.4   Liters of O2: ra   SpO2: 96   HR: 80   BP: 118/68   CBG: na  Exercise vitals:   Highest heartrate:  95   Lowest oxygen saturation: 87 increased to 94 with 02 started at 2L   Highest blood pressure: 118/84   Liters of 02: ra  Post-exercise vitals:   SpO2: 94   HR: 67   BP: 98/62   Liters of O2: 2L   CBG: na  Dr. Kalman Shan, Medical Director Dr. Vanessa Barbara is immediately available during today's Pulmonary Rehab session for Gerald Williams on 01/07/2014 at 1030 class time.

## 2014-01-09 ENCOUNTER — Encounter (HOSPITAL_COMMUNITY)
Admission: RE | Admit: 2014-01-09 | Discharge: 2014-01-09 | Disposition: A | Discharge status: Home / Self Care | Payer: Non-veteran care | Source: Ambulatory Visit | Attending: Internal Medicine | Admitting: Internal Medicine

## 2014-01-09 DIAGNOSIS — J441 Chronic obstructive pulmonary disease with (acute) exacerbation: Secondary | ICD-10-CM | POA: Diagnosis not present

## 2014-01-09 NOTE — Progress Notes (Signed)
Gerald Williams 63 y.o. male  Nutrition Note Spoke with pt. Pt wants to lose 10-15 lbs "from my stomach." How wt loss works and wt loss tips reviewed. Pt educated re: 1500 kcal, 5-d menu ideas. Per pt request, pt given a list of choose these foods instead of these. Pt expressed understanding of the information reviewed. Continue client-centered nutrition education by RD as part of interdisciplinary care.  Monitor and evaluate progress toward nutrition goal with team.  Mickle Plumb, M.Ed, RD, LDN, CDE 01/09/2014 2:11 PM

## 2014-01-09 NOTE — Progress Notes (Signed)
Today, Gerald Williams exercised at [employer]. Service time was from 10:30am to 12:30pm.  The patient exercised for more than 31 minutes performing aerobic, strengthening, and stretching exercises. Oxygen saturation, heart rate, blood pressure, rate of perceived exertion, and shortness of breath were all monitored before, during, and after exercise. [name] presented with no problems at today's exercise session. The patient attended education class on Exercise for the Pulmonary Patient with Kirt Boys Shane Melby today.   There was an increase in workload change during today's exercise session.  Pre-exercise vitals:   Weight kg: 76.0   Liters of O2: ra   SpO2: 94   HR: 70   BP: 100/64   CBG: na  Exercise vitals:   Highest heartrate:  100   Lowest oxygen saturation: 94   Highest blood pressure: 118/62   Liters of 02: ra  Post-exercise vitals:   SpO2: 94   HR: 80   BP: 116/64   Liters of O2: ra   CBG: na  Dr. Kalman Shan, Medical Director Dr. Rhona Leavens is immediately available during today's Pulmonary Rehab session for Gerald Williams on 01/09/14 at 10:30am class time.

## 2014-01-14 ENCOUNTER — Encounter (HOSPITAL_COMMUNITY)
Admission: RE | Admit: 2014-01-14 | Discharge: 2014-01-14 | Disposition: A | Discharge status: Home / Self Care | Payer: Non-veteran care | Source: Ambulatory Visit | Attending: Internal Medicine | Admitting: Internal Medicine

## 2014-01-14 DIAGNOSIS — J441 Chronic obstructive pulmonary disease with (acute) exacerbation: Secondary | ICD-10-CM | POA: Diagnosis not present

## 2014-01-14 NOTE — Progress Notes (Signed)
Today, Gerald Williams exercised at [facility] [employer] [facility]. Service time was from 10:30am to 12:15pm.  The patient exercised for more than 31 minutes performing aerobic, strengthening, and stretching exercises. Oxygen saturation, heart rate, blood pressure, rate of perceived exertion, and shortness of breath were all monitored before, during, and after exercise. [name] presented with no problems at today's exercise session.   There was no workload change during today's exercise session.  Pre-exercise vitals:   Weight kg: 76.9   Liters of O2: ra   SpO2: 94   HR: 83   BP: 110/60   CBG: na  Exercise vitals:   Highest heartrate:  96   Lowest oxygen saturation: 93   Highest blood pressure: 100/60   Liters of 02: ra  Post-exercise vitals:   SpO2: 96   HR: 80   BP: 112/54   Liters of O2: ra   CBG: na  Dr. Kalman Shan, Medical Director Dr. Rhona Leavens is immediately available during today's Pulmonary Rehab session for [name] on 01/14/14 at 10:30am class time.

## 2014-01-16 ENCOUNTER — Encounter (HOSPITAL_COMMUNITY): Payer: Non-veteran care

## 2014-01-21 ENCOUNTER — Encounter (HOSPITAL_COMMUNITY)
Admission: RE | Admit: 2014-01-21 | Discharge: 2014-01-21 | Disposition: A | Discharge status: Home / Self Care | Payer: Non-veteran care | Source: Ambulatory Visit | Attending: Internal Medicine | Admitting: Internal Medicine

## 2014-01-21 DIAGNOSIS — J441 Chronic obstructive pulmonary disease with (acute) exacerbation: Secondary | ICD-10-CM | POA: Diagnosis not present

## 2014-01-21 NOTE — Progress Notes (Signed)
Today, [name] exercised at [facility] [employer]. Service time was from 10:30am to 12:15pm.  The patient exercised for more than 31 minutes performing aerobic, strengthening, and stretching exercises. Oxygen saturation, heart rate, blood pressure, rate of perceived exertion, and shortness of breath were all monitored before, during, and after exercise. Gerald Williams presented with no problems at today's exercise session.   There was no workload change during today's exercise session.  Pre-exercise vitals:   Weight kg: 78.5   Liters of O2: ra   SpO2: 94   HR: 77   BP: 104/64   CBG: na  Exercise vitals:   Highest heartrate:  96   Lowest oxygen saturation: 89   Highest blood pressure:    Liters of 02: 2l  Post-exercise vitals:   SpO2: 97   HR: 80   BP: 98/66   Liters of O2: 2l   CBG: na  Dr. Kalman Shan, Medical Director Dr. Gwenlyn Perking is immediately available during today's Pulmonary Rehab session for Gerald Williams on 01/21/14 at 10:30am class time.

## 2014-01-23 ENCOUNTER — Encounter (HOSPITAL_COMMUNITY)
Admission: RE | Admit: 2014-01-23 | Discharge: 2014-01-23 | Disposition: A | Discharge status: Home / Self Care | Payer: Non-veteran care | Source: Ambulatory Visit | Attending: Internal Medicine | Admitting: Internal Medicine

## 2014-01-23 DIAGNOSIS — Z5189 Encounter for other specified aftercare: Secondary | ICD-10-CM | POA: Insufficient documentation

## 2014-01-23 DIAGNOSIS — J439 Emphysema, unspecified: Secondary | ICD-10-CM | POA: Insufficient documentation

## 2014-01-23 DIAGNOSIS — F17200 Nicotine dependence, unspecified, uncomplicated: Secondary | ICD-10-CM | POA: Insufficient documentation

## 2014-01-23 DIAGNOSIS — R0789 Other chest pain: Secondary | ICD-10-CM | POA: Insufficient documentation

## 2014-01-23 DIAGNOSIS — J449 Chronic obstructive pulmonary disease, unspecified: Secondary | ICD-10-CM | POA: Insufficient documentation

## 2014-01-23 NOTE — Progress Notes (Signed)
Today, Gerald Williams exercised at [facility] [facility]. Service time was from 11:15am to 1:30pm.  The patient exercised for more than 31 minutes performing aerobic, strengthening, and stretching exercises. Oxygen saturation, heart rate, blood pressure, rate of perceived exertion, and shortness of breath were all monitored before, during, and after exercise. Bohannon presented with no problems at today's exercise session. The patient attended MD Lecture Day with Dr. Molli Knock.  There was no workload change during today's exercise session.  Pre-exercise vitals:   Weight kg: 77.2   Liters of O2: 2   SpO2: 95   HR: 73   BP: 110/70   CBG: na  Exercise vitals:   Highest heartrate:  92   Lowest oxygen saturation: 95   Highest blood pressure: 118/62   Liters of 02: 2  Post-exercise vitals:   SpO2: 97   HR: 82   BP: 126/64   Liters of O2: 2   CBG: na  Dr. Kalman Shan, Medical Director Dr. [name] is immediately available during today's Pulmonary Rehab session for [name] on 01/23/14 at 10:30am class time.

## 2014-01-23 NOTE — Progress Notes (Signed)
Gerald Williams 63 y.o. male Nutrition Note Spoke with pt. Pt is overweight. Pt wants to lose wt. Per discussion, pt is trying to change his eating habits by "not eating too much grease." Pt has decreased fried food from several times a week to once a week. Pt is working on decreasing portion sizes consumed. Pt does not eat fruit/vegetables on a daily basis. Pt encouraged to increase fruit and veggie consumption to help with wt loss effort. Pt eats 2 "main" meals a day. There are some ways the pt can make his eating habits. Pt's Rate Your Plate results reviewed with pt. Pt does not avoid salty food; uses canned/ convenience food.  Pt adds salt to food. Pt is working on decreasing sodium while making healthier food choices. Pt expressed understanding of the information reviewed.  Nutrition Diagnosis   Food-and nutrition-related knowledge deficit related to lack of exposure to information as related to diagnosis of pulmonary disease   Overweight related to excessive energy intake as evidenced by a BMI of 27.4    Nutrition Rx/Est. Daily Nutrition Needs for: ? wt loss 1300-1800 Kcal  95-115 gm protein   1500 mg or less sodium      Nutrition Intervention   Pt's individual nutrition plan and goals reviewed with pt.   Benefits of adopting healthy eating habits discussed when pt's Rate Your Plate reviewed.   Pt to attend the Nutrition and Lung Disease class   Continual client-centered nutrition education by RD, as part of interdisciplinary care. Goal(s) 1. Pt to identify and limit food sources of sodium. 2. Identify food quantities necessary to achieve wt loss of  -2# per week at graduation from pulmonary rehab. 3. Describe the benefit of including fruits, vegetables, whole grains, and low-fat dairy products in a healthy meal plan. Monitor and Evaluate progress toward nutrition goal with team.   Mickle Plumb, M.Ed, RD, LDN, CDE 01/23/2014 1:31 PM

## 2014-01-28 ENCOUNTER — Encounter (HOSPITAL_COMMUNITY)
Admission: RE | Admit: 2014-01-28 | Discharge: 2014-01-28 | Disposition: A | Discharge status: Home / Self Care | Payer: Non-veteran care | Source: Ambulatory Visit | Attending: Internal Medicine | Admitting: Internal Medicine

## 2014-01-28 NOTE — Progress Notes (Signed)
Today, Gerald Williams exercised at [facility] [employer] [facility]. Service time was from 1030 to 1215.  The patient exercised for more than 31 minutes performing aerobic, strengthening, and stretching exercises. Oxygen saturation, heart rate, blood pressure, rate of perceived exertion, and shortness of breath were all monitored before, during, and after exercise. Gerald Williams presented with no problems at today's exercise session.   There was a workload change during today's exercise session.  Pre-exercise vitals:   Weight kg: 77.4   Liters of O2: 2L   SpO2: 95   HR: 85   BP: 124/76   CBG: na  Exercise vitals:   Highest heartrate:  105   Lowest oxygen saturation: 96   Highest blood pressure: 106/60   Liters of 02: 2L  Post-exercise vitals:   SpO2: 95   HR: 89   BP: 112/70   Liters of O2: 2L   CBG: na  Dr. Kalman Shan, Medical Director Dr. [name] is immediately available during today's Pulmonary Rehab session for Gerald Williams on 01/28/2014 at 1030 class time.

## 2014-01-30 ENCOUNTER — Encounter (HOSPITAL_COMMUNITY)
Admission: RE | Admit: 2014-01-30 | Discharge: 2014-01-30 | Disposition: A | Discharge status: Home / Self Care | Payer: Non-veteran care | Source: Ambulatory Visit | Attending: Internal Medicine | Admitting: Internal Medicine

## 2014-01-30 NOTE — Progress Notes (Signed)
Today, Gerald Williams exercised at [facility] [employer]. Service time was from 1030 to 1230.  The patient exercised for more than 31 minutes performing aerobic, strengthening, and stretching exercises. Oxygen saturation, heart rate, blood pressure, rate of perceived exertion, and shortness of breath were all monitored before, during, and after exercise. [name] presented with no problems at today's exercise session. [name] also attended an education session on pulmonary medications.   There was no workload change during today's exercise session.  Pre-exercise vitals:   Weight kg: 77.2   Liters of O2: 2L   SpO2: 94   HR: 89   BP: 89/60   CBG: na  Exercise vitals:   Highest heartrate:  91   Lowest oxygen saturation: 97   Highest blood pressure: 104/76   Liters of 02: 2L  Post-exercise vitals:   SpO2: 97   HR: 80   BP: 120/74   Liters of O2: 2L   CBG: na  Dr. Kalman Shan, Medical Director Dr. Gwenlyn Perking is immediately available during today's Pulmonary Rehab session for [name] on 01/30/2014 at 1030 class time.

## 2014-02-04 ENCOUNTER — Encounter (HOSPITAL_COMMUNITY)
Admission: RE | Admit: 2014-02-04 | Discharge: 2014-02-04 | Disposition: A | Discharge status: Home / Self Care | Payer: Non-veteran care | Source: Ambulatory Visit | Attending: Internal Medicine | Admitting: Internal Medicine

## 2014-02-04 NOTE — Progress Notes (Signed)
Today, Gerald Williams exercised at [facility] [employer] [facility]. Service time was from 10:30am to 12:00pm.  The patient exercised for more than 31 minutes performing aerobic, strengthening, and stretching exercises. Oxygen saturation, heart rate, blood pressure, rate of perceived exertion, and shortness of breath were all monitored before, during, and after exercise. Gerald Williams presented with no problems at today's exercise session.   There was an increase in workload change during today's exercise session.  Pre-exercise vitals:   Weight kg: 77.4   Liters of O2: 2   SpO2: 97   HR: 74   BP: 124/72   CBG: na  Exercise vitals:   Highest heartrate:  105   Lowest oxygen saturation: 91   Highest blood pressure: 118/88   Liters of 02: 2  Post-exercise vitals:   SpO2: 95   HR: 94   BP: 122/64   Liters of O2: 2   CBG: na  Dr. Kalman Shan, Medical Director Dr. Gwenlyn Perking is immediately available during today's Pulmonary Rehab session for Gerald Williams on 02/04/14 at 10:30am class time.

## 2014-02-06 ENCOUNTER — Encounter (HOSPITAL_COMMUNITY)
Admission: RE | Admit: 2014-02-06 | Discharge: 2014-02-06 | Disposition: A | Discharge status: Home / Self Care | Payer: Non-veteran care | Source: Ambulatory Visit | Attending: Internal Medicine | Admitting: Internal Medicine

## 2014-02-06 NOTE — Progress Notes (Signed)
Today, Gerald Williams exercised at [facility] [employer]. Service time was from 1030 to 1230.  The patient exercised for more than 31 minutes performing aerobic, strengthening, and stretching exercises. Oxygen saturation, heart rate, blood pressure, rate of perceived exertion, and shortness of breath were all monitored before, during, and after exercise. Gerald Williams presented with no problems at today's exercise session. Patient attended the oxygen safety class today.    There was one workload change during today's exercise session.  Pre-exercise vitals:   Weight kg: 77.8   Liters of O2: 2   SpO2: 94   HR: 77   BP: 124/64   CBG: NA  Exercise vitals:   Highest heartrate:  90   Lowest oxygen saturation: 96   Highest blood pressure: 108/88   Liters of 02: 2  Post-exercise vitals:   SpO2: 97   HR: 87   BP: 100/80   Liters of O2: 2   CBG: NA Dr. Kalman Shan, Medical Director Dr. [name] is immediately available during today's Pulmonary Rehab session for Gerald Williams on 02/06/2014 at 1030 class time.

## 2014-02-11 ENCOUNTER — Encounter (HOSPITAL_COMMUNITY)
Admission: RE | Admit: 2014-02-11 | Discharge: 2014-02-11 | Disposition: A | Discharge status: Home / Self Care | Payer: Non-veteran care | Source: Ambulatory Visit | Attending: Internal Medicine | Admitting: Internal Medicine

## 2014-02-11 NOTE — Progress Notes (Signed)
Today, Gerald Williams exercised at [facility] [employer] [facility]. Service time was from 1030 to 1215.  The patient exercised for more than 31 minutes performing aerobic, strengthening, and stretching exercises. Oxygen saturation, heart rate, blood pressure, rate of perceived exertion, and shortness of breath were all monitored before, during, and after exercise. [name] presented with no problems at today's exercise session.   There was no workload change during today's exercise session.  Pre-exercise vitals:   Weight kg: 76.9   Liters of O2: 2   SpO2: 95   HR: 80   BP: 100/60   CBG: NA  Exercise vitals:   Highest heartrate:  93   Lowest oxygen saturation: 93   Highest blood pressure: 118/86   Liters of 02: 2  Post-exercise vitals:   SpO2: 98   HR: 74   BP: 118/66   Liters of O2: 2   CBG: NA Dr. Kalman Shan, Medical Director Dr. [name] is immediately available during today's Pulmonary Rehab session for Gerald Williams on 02/11/2014 at 1030 class time.

## 2014-02-11 NOTE — Progress Notes (Signed)
I have reviewed a Home Exercise Prescription with Gerald Williams . Gerald Williams is currently exercising at home.  The patient was advised to walk 2-3 days a week for 25 minutes.  Gerald Williams and I discussed how to progress their exercise prescription.  The patient stated that their goals were to build walking endurance and to improve breathing.  The patient stated that they understand the exercise prescription.  We reviewed exercise guidelines, target heart rate during exercise, oxygen use, weather, home pulse oximeter, endpoints for exercise, and goals.  Patient is encouraged to come to me with any questions. I will continue to follow up with the patient to assist them with progression and safety.

## 2014-02-13 ENCOUNTER — Encounter (HOSPITAL_COMMUNITY)
Admission: RE | Admit: 2014-02-13 | Discharge: 2014-02-13 | Disposition: A | Discharge status: Home / Self Care | Payer: Non-veteran care | Source: Ambulatory Visit | Attending: Internal Medicine | Admitting: Internal Medicine

## 2014-02-13 NOTE — Progress Notes (Signed)
Today, [name] exercised at [facility] [employer] [facility]. Service time was from 1030 to 1230.  The patient exercised for more than 31 minutes performing aerobic, strengthening, and stretching exercises. Oxygen saturation, heart rate, blood pressure, rate of perceived exertion, and shortness of breath were all monitored before, during, and after exercise. Chante presented with no problems at today's exercise session. Patient attended the [name] services class today.  There was no workload change during today's exercise session.  Pre-exercise vitals:   Weight kg: 77.0   Liters of O2: 2   SpO2: 98   HR: 74   BP: 110/64   CBG: NA  Exercise vitals:   Highest heartrate:  93   Lowest oxygen saturation: 93   Highest blood pressure: 112/74   Liters of 02: 2  Post-exercise vitals:   SpO2: 95   HR: 87   BP: 118/60   Liters of O2: 2   CBG: NA Dr. Kalman Shan, Medical Director [name] is immediately available during today's Pulmonary Rehab session for [name] on 02/13/2014 at 1030 class time.

## 2014-02-18 ENCOUNTER — Encounter (HOSPITAL_COMMUNITY)
Admission: RE | Admit: 2014-02-18 | Discharge: 2014-02-18 | Disposition: A | Discharge status: Home / Self Care | Payer: Non-veteran care | Source: Ambulatory Visit | Attending: Internal Medicine | Admitting: Internal Medicine

## 2014-02-18 NOTE — Progress Notes (Signed)
Today, Gerald Williams exercised at [facility] [employer] [facility]. Service time was from 10:30am to 12:15am.  The patient exercised for more than 31 minutes performing aerobic, strengthening, and stretching exercises. Oxygen saturation, heart rate, blood pressure, rate of perceived exertion, and shortness of breath were all monitored before, during, and after exercise. Gerald Williams presented with no problems at today's exercise session.   There was no workload change during today's exercise session.  Pre-exercise vitals:   Weight kg: 78.1   Liters of O2: 2   SpO2: 96   HR: 94   BP: 100/60   CBG: na  Exercise vitals:   Highest heartrate:  104   Lowest oxygen saturation: 95   Highest blood pressure: 108/84   Liters of 02: 2  Post-exercise vitals:   SpO2: 96   HR: 93   BP: 124/60   Liters of O2: 2   CBG: na  Dr. Kalman Shan, Medical Director [name] is immediately available during today's Pulmonary Rehab session for Gerald Williams on 02/18/14 at 10:30am class time.

## 2014-02-20 ENCOUNTER — Encounter (HOSPITAL_COMMUNITY)
Admission: RE | Admit: 2014-02-20 | Discharge: 2014-02-20 | Disposition: A | Discharge status: Home / Self Care | Payer: Non-veteran care | Source: Ambulatory Visit | Attending: Internal Medicine | Admitting: Internal Medicine

## 2014-02-20 NOTE — Progress Notes (Signed)
Today, Gerald Williams exercised at [facility] [facility]. Service time was from 10:30am to 12:30pm.  The patient exercised for more than 31 minutes performing aerobic, strengthening, and stretching exercises. Oxygen saturation, heart rate, blood pressure, rate of perceived exertion, and shortness of breath were all monitored before, during, and after exercise. Gerald Williams presented with no problems at today's exercise session. The patient attended education class with Theda Belfast on [employer].  There was no workload change during today's exercise session.  Pre-exercise vitals:   Weight kg: 78.2   Liters of O2: 2   SpO2: 96   HR: 77   BP: 108/90   CBG: na  Exercise vitals:   Highest heartrate:  90   Lowest oxygen saturation: 93   Highest blood pressure: 120/70   Liters of 02: 2  Post-exercise vitals:   SpO2: 98   HR: 73   BP: 108/72   Liters of O2: 2   CBG: na  Dr. Kalman Shan, Medical Director Dr. [name] is immediately available during today's Pulmonary Rehab session for [name] on 02/20/14 at 10:30am class time.

## 2014-02-25 ENCOUNTER — Encounter (HOSPITAL_COMMUNITY): Payer: Non-veteran care

## 2014-02-27 ENCOUNTER — Encounter (HOSPITAL_COMMUNITY)
Admission: RE | Admit: 2014-02-27 | Discharge: 2014-02-27 | Disposition: A | Discharge status: Home / Self Care | Payer: Non-veteran care | Source: Ambulatory Visit | Attending: Internal Medicine | Admitting: Internal Medicine

## 2014-02-27 DIAGNOSIS — R079 Chest pain, unspecified: Secondary | ICD-10-CM | POA: Insufficient documentation

## 2014-02-27 DIAGNOSIS — Z5189 Encounter for other specified aftercare: Secondary | ICD-10-CM | POA: Insufficient documentation

## 2014-02-27 DIAGNOSIS — F17200 Nicotine dependence, unspecified, uncomplicated: Secondary | ICD-10-CM | POA: Insufficient documentation

## 2014-02-27 DIAGNOSIS — J439 Emphysema, unspecified: Secondary | ICD-10-CM | POA: Insufficient documentation

## 2014-02-27 DIAGNOSIS — J449 Chronic obstructive pulmonary disease, unspecified: Secondary | ICD-10-CM | POA: Insufficient documentation

## 2014-02-27 NOTE — Progress Notes (Signed)
Today, Gerald Williams exercised at [facility] [facility]. Service time was from 10:30am to 12:30pm.  The patient exercised for more than 31 minutes performing aerobic, strengthening, and stretching exercises. Oxygen saturation, heart rate, blood pressure, rate of perceived exertion, and shortness of breath were all monitored before, during, and after exercise. Gerald Williams presented with no problems at today's exercise session. Patient attended education with Gardiner Fanti on [place] [employer].  There was an increase in workload change during today's exercise session.  Pre-exercise vitals: . Weight kg: 77.6 . Liters of O2: 2 . SpO2: 95 . HR: 80 . BP: 110/70 . CBG: na  Exercise vitals: . Highest heartrate:  87 . Lowest oxygen saturation: 94 . Highest blood pressure: 134/72 . Liters of 02: 2  Post-exercise vitals: . SpO2: 96 . HR: 69 . BP: 124/60 . Liters of O2: 2 . CBG: na  Dr. Kalman Shan, Medical Director [name] is immediately available during today's Pulmonary Rehab session for Gerald Williams on 02/27/14 at 10:30am class time.

## 2014-03-04 ENCOUNTER — Encounter (HOSPITAL_COMMUNITY)
Admission: RE | Admit: 2014-03-04 | Discharge: 2014-03-04 | Disposition: A | Discharge status: Home / Self Care | Payer: Non-veteran care | Source: Ambulatory Visit | Attending: Internal Medicine | Admitting: Internal Medicine

## 2014-03-04 NOTE — Progress Notes (Signed)
Today, [name] exercised at [facility] [facility]. Service time was from 10:30am to 12:15pm.  The patient exercised for more than 31 minutes performing aerobic, strengthening, and stretching exercises. Oxygen saturation, heart rate, blood pressure, rate of perceived exertion, and shortness of breath were all monitored before, during, and after exercise. Gerald Williams presented with no problems at today's exercise session.   There was an increase in workload change during today's exercise session.  Pre-exercise vitals: . Weight kg: 76.9 . Liters of O2: 2 . SpO2: 97 . HR: 74 . BP: 110/60 . CBG: na  Exercise vitals: . Highest heartrate:  94 . Lowest oxygen saturation: 96 . Highest blood pressure: 110/60 . Liters of 02: 2  Post-exercise vitals: . SpO2: 97 . HR: 69 . BP: 108/72 . Liters of O2: 2 . CBG: na  Dr. Kalman Shan, Medical Director [name] is immediately available during today's Pulmonary Rehab session for Gerald Williams on 03/04/14 at 10:30am class time.

## 2014-03-06 ENCOUNTER — Encounter (HOSPITAL_COMMUNITY)
Admission: RE | Admit: 2014-03-06 | Discharge: 2014-03-06 | Disposition: A | Discharge status: Home / Self Care | Payer: Non-veteran care | Source: Ambulatory Visit | Attending: Internal Medicine | Admitting: Internal Medicine

## 2014-03-06 NOTE — Progress Notes (Signed)
Today, [name] exercised at [facility] [facility]. Service time was from 10:30am to 12:15pm.  The patient exercised for more than 31 minutes performing aerobic, strengthening, and stretching exercises. Oxygen saturation, heart rate, blood pressure, rate of perceived exertion, and shortness of breath were all monitored before, during, and after exercise. Gerald Williams presented with no problems at today's exercise session.  Patient attended education class today with Yvone Neu on Oxygen Use and Safety.  There was no workload change during today's exercise session.  Pre-exercise vitals: . Weight kg: 76.9 . Liters of O2: 2 . SpO2: 96 . HR: 82 . BP: 126/70 . CBG: na  Exercise vitals: . Highest heartrate:  107 . Lowest oxygen saturation: 93 . Highest blood pressure: 122/70 . Liters of 02: 2  Post-exercise vitals: . SpO2: 97 . HR: 83 . BP: 106/60 . Liters of O2: 2 . CBG: na  Dr. Kalman Shan, Medical Director Dr. Rhona Leavens is immediately available during today's Pulmonary Rehab session for [name] on 03/06/14 at 10:30am class time.

## 2014-03-11 ENCOUNTER — Encounter (HOSPITAL_COMMUNITY)
Admission: RE | Admit: 2014-03-11 | Discharge: 2014-03-11 | Disposition: A | Discharge status: Home / Self Care | Payer: Non-veteran care | Source: Ambulatory Visit | Attending: Internal Medicine | Admitting: Internal Medicine

## 2014-03-11 NOTE — Progress Notes (Signed)
Today, Gerald Williams exercised at [facility] [facility]. Service time was from 10:30am to 12:00pm.  The patient exercised for more than 31 minutes performing aerobic, strengthening, and stretching exercises. Oxygen saturation, heart rate, blood pressure, rate of perceived exertion, and shortness of breath were all monitored before, during, and after exercise. Gerald Williams presented with no problems at today's exercise session.   There was an increase in workload change during today's exercise session.  Pre-exercise vitals: . Weight kg: 76.4 . Liters of O2: 2 . SpO2: 96 . HR: 89 . BP: 130/72 . CBG: na  Exercise vitals: . Highest heartrate:  107 . Lowest oxygen saturation: 95 . Highest blood pressure: 100/60 . Liters of 02: 2  Post-exercise vitals: . SpO2: 98 . HR: 82 . BP: 114/70 . Liters of O2: 2 . CBG: na  Dr. Kalman Shan, Medical Director Dr. Rhona Leavens is immediately available during today's Pulmonary Rehab session for [name] on 03/11/14 at 10:30am class time.

## 2014-03-13 ENCOUNTER — Encounter (HOSPITAL_COMMUNITY)
Admission: RE | Admit: 2014-03-13 | Discharge: 2014-03-13 | Disposition: A | Discharge status: Home / Self Care | Payer: Non-veteran care | Source: Ambulatory Visit | Attending: Internal Medicine | Admitting: Internal Medicine

## 2014-03-13 NOTE — Progress Notes (Signed)
Today, [name] exercised at [facility] [facility]. Service time was from 1030 to 1230.  The patient exercised for more than 31 minutes performing aerobic, strengthening, and stretching exercises. Oxygen saturation, heart rate, blood pressure, rate of perceived exertion, and shortness of breath were all monitored before, during, and after exercise. Gerald Williams presented with no problems at today's exercise session. Gerald Williams also attended an education session on exercise for the pulmonary patient.  There was no workload change during today's exercise session.  Pre-exercise vitals: . Weight kg: 76.4 . Liters of O2: 2L . SpO2: 95 . HR: 86 . BP: 100/60 . CBG: na  Exercise vitals: . Highest heartrate:  99 . Lowest oxygen saturation: 96 . Highest blood pressure: 104/70 . Liters of 02: 2L  Post-exercise vitals: . SpO2: 97 . HR: 96 . BP: 110/70 . Liters of O2: 2L . CBG: na  Dr. Kalman Shan, Medical Director [name] is immediately available during today's Pulmonary Rehab session for Gerald Williams on 03/13/2014 at 1030 class time.

## 2014-03-18 ENCOUNTER — Encounter (HOSPITAL_COMMUNITY): Payer: Non-veteran care

## 2014-03-20 ENCOUNTER — Encounter (HOSPITAL_COMMUNITY): Payer: Non-veteran care

## 2014-03-25 ENCOUNTER — Encounter (HOSPITAL_COMMUNITY)
Admission: RE | Admit: 2014-03-25 | Discharge: 2014-03-25 | Disposition: A | Discharge status: Home / Self Care | Payer: Non-veteran care | Source: Ambulatory Visit | Attending: Internal Medicine | Admitting: Internal Medicine

## 2014-03-25 DIAGNOSIS — J449 Chronic obstructive pulmonary disease, unspecified: Secondary | ICD-10-CM | POA: Insufficient documentation

## 2014-03-25 DIAGNOSIS — F17200 Nicotine dependence, unspecified, uncomplicated: Secondary | ICD-10-CM | POA: Insufficient documentation

## 2014-03-25 DIAGNOSIS — R079 Chest pain, unspecified: Secondary | ICD-10-CM | POA: Insufficient documentation

## 2014-03-25 DIAGNOSIS — J439 Emphysema, unspecified: Secondary | ICD-10-CM | POA: Insufficient documentation

## 2014-03-25 DIAGNOSIS — Z5189 Encounter for other specified aftercare: Secondary | ICD-10-CM | POA: Insufficient documentation

## 2014-03-25 NOTE — Progress Notes (Signed)
Today, Gerald Williams exercised at [facility] [employer] [facility]. Service time was from 10:30am to 12:15pm.  The patient exercised for more than 31 minutes performing aerobic, strengthening, and stretching exercises. Oxygen saturation, heart rate, blood pressure, rate of perceived exertion, and shortness of breath were all monitored before, during, and after exercise. Gerald Williams presented with no problems at today's exercise session.   There was no workload change during today's exercise session.  Pre-exercise vitals: . Weight kg: 77.8 . Liters of O2: 2 . SpO2: 96 . HR: 94 . BP: 122/60 . CBG: na  Exercise vitals: . Highest heartrate:  108 . Lowest oxygen saturation: 92 . Highest blood pressure: 126/70 . Liters of 02: 2  Post-exercise vitals: . SpO2: 96 . HR: 84 . BP: 114/66 . Liters of O2: 2 . CBG: na  Dr. Kalman Shan, Medical Director Dr. Randol Kern is immediately available during today's Pulmonary Rehab session for Gerald Williams on 03/25/14 at 10:30am class time.

## 2014-03-27 ENCOUNTER — Encounter (HOSPITAL_COMMUNITY)
Admission: RE | Admit: 2014-03-27 | Discharge: 2014-03-27 | Disposition: A | Discharge status: Home / Self Care | Payer: Non-veteran care | Source: Ambulatory Visit | Attending: Internal Medicine | Admitting: Internal Medicine

## 2014-03-27 NOTE — Progress Notes (Signed)
Today, Gerald Williams exercised at [facility] [facility]. Service time was from 1030 to 1225.  The patient exercised for more than 31 minutes performing aerobic, strengthening, and stretching exercises. Oxygen saturation, heart rate, blood pressure, rate of perceived exertion, and shortness of breath were all monitored before, during, and after exercise. Tyronn presented with no problems at today's exercise session.   There was no workload change during today's exercise session.  Pre-exercise vitals: . Weight kg: 76.6 . Liters of O2: 2L . SpO2: 93 . HR: 94 . BP: 114/68 . CBG: na  Exercise vitals: . Highest heartrate:  107 . Lowest oxygen saturation: 94 . Highest blood pressure: 118/76 . Liters of 02: 2L  Post-exercise vitals: . SpO2: 95 . HR: 98 . BP: 108/70 . Liters of O2: 2L . CBG: na  Dr. Kalman Shan, Medical Director Dr. Isidoro Donning is immediately available during today's Pulmonary Rehab session for [name] on 03/27/2014 at 1030 class time.

## 2014-04-01 ENCOUNTER — Encounter (HOSPITAL_COMMUNITY)
Admission: RE | Admit: 2014-04-01 | Discharge: 2014-04-01 | Disposition: A | Discharge status: Home / Self Care | Payer: Non-veteran care | Source: Ambulatory Visit | Attending: Internal Medicine | Admitting: Internal Medicine

## 2014-04-01 NOTE — Progress Notes (Signed)
Today, [name] exercised at [facility] [employer] [facility]. Service time was from 10:30am to 12:00pm.  The patient exercised for more than 31 minutes performing aerobic, strengthening, and stretching exercises. Oxygen saturation, heart rate, blood pressure, rate of perceived exertion, and shortness of breath were all monitored before, during, and after exercise. Branton presented with no problems at today's exercise session.   There was no workload change during today's exercise session.  Pre-exercise vitals: . Weight kg: 77.9 . Liters of O2: 2 . SpO2: 96 . HR: 73 . BP: 98/74 . CBG: na  Exercise vitals: . Highest heartrate:  99 . Lowest oxygen saturation: 93 . Highest blood pressure: 136/72 . Liters of 02: 2  Post-exercise vitals: . SpO2: 97 . HR: 83 . BP: 100/72 . Liters of O2: 2 . CBG: na  Dr. Kalman Shan, Medical Director Dr. Isidoro Donning is immediately available during today's Pulmonary Rehab session for [name] on 04/01/14 at 10:30am class time.

## 2014-04-03 ENCOUNTER — Encounter (HOSPITAL_COMMUNITY)
Admission: RE | Admit: 2014-04-03 | Discharge: 2014-04-03 | Disposition: A | Discharge status: Home / Self Care | Payer: Non-veteran care | Source: Ambulatory Visit | Attending: Internal Medicine | Admitting: Internal Medicine

## 2014-04-03 NOTE — Progress Notes (Signed)
Today, Gerald Williams exercised at [employer]. Service time was from 1030 to 1220.  The patient exercised for more than 31 minutes performing aerobic, strengthening, and stretching exercises. Oxygen saturation, heart rate, blood pressure, rate of perceived exertion, and shortness of breath were all monitored before, during, and after exercise. [place] presented with no problems at today's exercise session. Alok also attended an education session on advanced directives.  There was no workload change during today's exercise session.  Pre-exercise vitals: . Weight kg: 77.1 . Liters of O2: 2L . SpO2: 95 . HR: 78 . BP: 124/68 . CBG: na  Exercise vitals: . Highest heartrate:  108 . Lowest oxygen saturation: 93 . Highest blood pressure: 122/74 . Liters of 02: 2L  Post-exercise vitals: . SpO2: 98 . HR: 73 . BP: 118/52 . Liters of O2: 2L . CBG: na  Dr. Kalman Shan, Medical Director Dr. Vanessa Barbara is immediately available during today's Pulmonary Rehab session for [name] on 04/03/2014 at 1030 class time.

## 2014-04-08 ENCOUNTER — Encounter (HOSPITAL_COMMUNITY)
Admission: RE | Admit: 2014-04-08 | Discharge: 2014-04-08 | Disposition: A | Discharge status: Home / Self Care | Payer: Non-veteran care | Source: Ambulatory Visit | Attending: Internal Medicine | Admitting: Internal Medicine

## 2014-04-08 NOTE — Progress Notes (Signed)
Today, Gerald Williams exercised at [employer]. Service time was from 1030 to 1215.  The patient exercised for more than 31 minutes performing aerobic, strengthening, and stretching exercises. Oxygen saturation, heart rate, blood pressure, rate of perceived exertion, and shortness of breath were all monitored before, during, and after exercise. Gerald Williams presented with no problems at today's exercise session.   There was no workload change during today's exercise session.  Pre-exercise vitals: . Weight kg: 75.7 . Liters of O2: 2 . SpO2: 97 . HR: 88 . BP: 112/68 . CBG: na  Exercise vitals: . Highest heartrate:  106 . Lowest oxygen saturation: 92 . Highest blood pressure: 132/64 . Liters of 02: 2  Post-exercise vitals: . SpO2: 96 . HR: 88 . BP: 124/62 . Liters of O2: 2 . CBG: na Dr. Kalman Shan, Medical Director Dr. Vanessa Barbara is immediately available during today's Pulmonary Rehab session for Gerald Williams on 04/08/2014 at 1030 class time.  Marland Kitchen

## 2014-04-10 ENCOUNTER — Encounter (HOSPITAL_COMMUNITY)
Admission: RE | Admit: 2014-04-10 | Discharge: 2014-04-10 | Disposition: A | Discharge status: Home / Self Care | Payer: Non-veteran care | Source: Ambulatory Visit | Attending: Internal Medicine | Admitting: Internal Medicine

## 2014-04-10 NOTE — Progress Notes (Signed)
Gerald Williams completed a Six-Minute Walk Test on 04/10/14 . Gerald Williams walked 1106 feet with 0 breaks.  The patient's lowest oxygen saturation was 94% , highest heart rate was 103 bpm , and highest blood pressure was 124/80. The patient was on 4 liters of oxygen with a nasal cannula. Gerald Williams stated that nothing hindered his walk test.

## 2014-04-11 ENCOUNTER — Emergency Department (HOSPITAL_COMMUNITY)
Admission: EM | Admit: 2014-04-11 | Discharge: 2014-04-11 | Disposition: A | Discharge status: Home / Self Care | Payer: Non-veteran care | Attending: Emergency Medicine | Admitting: Emergency Medicine

## 2014-04-11 ENCOUNTER — Encounter (HOSPITAL_COMMUNITY): Payer: Self-pay | Admitting: *Deleted

## 2014-04-11 ENCOUNTER — Emergency Department (HOSPITAL_COMMUNITY): Payer: Non-veteran care

## 2014-04-11 DIAGNOSIS — Z9981 Dependence on supplemental oxygen: Secondary | ICD-10-CM | POA: Insufficient documentation

## 2014-04-11 DIAGNOSIS — Z87891 Personal history of nicotine dependence: Secondary | ICD-10-CM | POA: Diagnosis not present

## 2014-04-11 DIAGNOSIS — J441 Chronic obstructive pulmonary disease with (acute) exacerbation: Secondary | ICD-10-CM | POA: Diagnosis not present

## 2014-04-11 DIAGNOSIS — Z7951 Long term (current) use of inhaled steroids: Secondary | ICD-10-CM | POA: Insufficient documentation

## 2014-04-11 DIAGNOSIS — R079 Chest pain, unspecified: Secondary | ICD-10-CM | POA: Diagnosis not present

## 2014-04-11 DIAGNOSIS — Z79899 Other long term (current) drug therapy: Secondary | ICD-10-CM | POA: Diagnosis not present

## 2014-04-11 LAB — BASIC METABOLIC PANEL
Anion gap: 11 (ref 5–15)
BUN: 13 mg/dL (ref 6–23)
CO2: 27 mEq/L (ref 19–32)
CREATININE: 1.16 mg/dL (ref 0.50–1.35)
Calcium: 9.7 mg/dL (ref 8.4–10.5)
Chloride: 106 mEq/L (ref 96–112)
GFR, EST AFRICAN AMERICAN: 76 mL/min — AB (ref >90)
GFR, EST NON AFRICAN AMERICAN: 66 mL/min — AB (ref >90)
Glucose, Bld: 101 mg/dL — ABNORMAL HIGH (ref 70–99)
Potassium: 4.5 mEq/L (ref 3.7–5.3)
Sodium: 144 mEq/L (ref 137–147)

## 2014-04-11 LAB — CBC
HCT: 42 % (ref 39.0–52.0)
Hemoglobin: 14.7 g/dL (ref 13.0–17.0)
MCH: 31.4 pg (ref 26.0–34.0)
MCHC: 35 g/dL (ref 30.0–36.0)
MCV: 89.7 fL (ref 78.0–100.0)
PLATELETS: 243 10*3/uL (ref 150–400)
RBC: 4.68 MIL/uL (ref 4.22–5.81)
RDW: 13.4 % (ref 11.5–15.5)
WBC: 3.9 10*3/uL — ABNORMAL LOW (ref 4.0–10.5)

## 2014-04-11 LAB — I-STAT TROPONIN, ED
TROPONIN I, POC: 0.01 ng/mL (ref 0.00–0.08)
TROPONIN I, POC: 0 ng/mL (ref 0.00–0.08)

## 2014-04-11 LAB — TROPONIN I: Troponin I: <0.3 ng/mL (ref <0.30)

## 2014-04-11 MED ORDER — MORPHINE SULFATE 4 MG/ML IJ SOLN
6.0000 mg | Freq: Once | INTRAMUSCULAR | Status: AC
  Administered 2014-04-11: 6 mg via INTRAVENOUS
  Filled 2014-04-11: qty 2

## 2014-04-11 NOTE — Discharge Instructions (Signed)
Patient had episode of chest pain, HEART score 3, discussed possible stress test with your Physician at the [facility] or local physician. If you were given medicines take as directed.  If you are on coumadin or contraceptives realize their levels and effectiveness is altered by many different medicines.  If you have any reaction (rash, tongues swelling, other) to the medicines stop taking and see a physician.   Please follow up as directed and return to the ER or see a physician for new or worsening symptoms.  Thank you. Filed Vitals:   04/11/14 1136 04/11/14 1235 04/11/14 1300 04/11/14 1330  BP: 133/94 125/91 152/89 154/98  Pulse: 64  62 62  Temp:      Resp: 23 28 15 14   Height:      Weight:      SpO2: 96%  97% 96%    Chest Pain (Nonspecific) It is often hard to give a specific diagnosis for the cause of chest pain. There is always a chance that your pain could be related to something serious, such as a heart attack or a blood clot in the lungs. You need to follow up with your health care provider for further evaluation. CAUSES   Heartburn.  Pneumonia or bronchitis.  Anxiety or stress.  Inflammation around your heart (pericarditis) or lung (pleuritis or pleurisy).  A blood clot in the lung.  A collapsed lung (pneumothorax). It can develop suddenly on its own (spontaneous pneumothorax) or from trauma to the chest.  Shingles infection (herpes zoster virus). The chest wall is composed of bones, muscles, and cartilage. Any of these can be the source of the pain.  The bones can be bruised by injury.  The muscles or cartilage can be strained by coughing or overwork.  The cartilage can be affected by inflammation and become sore (costochondritis). DIAGNOSIS  Lab tests or other studies may be needed to find the cause of your pain. Your health care provider may have you take a test called an ambulatory electrocardiogram (ECG). An ECG records your heartbeat patterns over a 24-hour period. You  may also have other tests, such as:  Transthoracic echocardiogram (TTE). During echocardiography, sound waves are used to evaluate how blood flows through your heart.  Transesophageal echocardiogram (TEE).  Cardiac monitoring. This allows your health care provider to monitor your heart rate and rhythm in real time.  Holter monitor. This is a portable device that records your heartbeat and can help diagnose heart arrhythmias. It allows your health care provider to track your heart activity for several days, if needed.  Stress tests by exercise or by giving medicine that makes the heart beat faster. TREATMENT   Treatment depends on what may be causing your chest pain. Treatment may include:  Acid blockers for heartburn.  Anti-inflammatory medicine.  Pain medicine for inflammatory conditions.  Antibiotics if an infection is present.  You may be advised to change lifestyle habits. This includes stopping smoking and avoiding alcohol, caffeine, and chocolate.  You may be advised to keep your head raised (elevated) when sleeping. This reduces the chance of acid going backward from your stomach into your esophagus. Most of the time, nonspecific chest pain will improve within 2-3 days with rest and mild pain medicine.  HOME CARE INSTRUCTIONS   If antibiotics were prescribed, take them as directed. Finish them even if you start to feel better.  For the next few days, avoid physical activities that bring on chest pain. Continue physical activities as directed.  Do  not use any tobacco products, including cigarettes, chewing tobacco, or electronic cigarettes.  Avoid drinking alcohol.  Only take medicine as directed by your health care provider.  Follow your health care provider's suggestions for further testing if your chest pain does not go away.  Keep any follow-up appointments you made. If you do not go to an appointment, you could develop lasting (chronic) problems with pain. If there  is any problem keeping an appointment, call to reschedule. SEEK MEDICAL CARE IF:   Your chest pain does not go away, even after treatment.  You have a rash with blisters on your chest.  You have a fever. SEEK IMMEDIATE MEDICAL CARE IF:   You have increased chest pain or pain that spreads to your arm, neck, jaw, back, or abdomen.  You have shortness of breath.  You have an increasing cough, or you cough up blood.  You have severe back or abdominal pain.  You feel nauseous or vomit.  You have severe weakness.  You faint.  You have chills. This is an emergency. Do not wait to see if the pain will go away. Get medical help at once. Call your local emergency services (911 in [place] Do not drive yourself to the hospital. MAKE SURE YOU:   Understand these instructions.  Will watch your condition.  Will get help right away if you are not doing well or get worse. Document Released: 01/19/2005 Document Revised: 04/16/2013 Document Reviewed: 11/15/2007 [facility] Patient Information 2015 [employer], [place]. This information is not intended to replace advice given to you by your health care provider. Make sure you discuss any questions you have with your health care provider.

## 2014-04-11 NOTE — ED Provider Notes (Signed)
CSN: [id number]     Arrival date & time 04/11/14  1044 History   First MD Initiated Contact with Patient 04/11/14 1135     Chief Complaint  Patient presents with  . Chest Pain     (Consider location/radiation/quality/duration/timing/severity/associated sxs/prior Treatment) HPI Comments: 63 year old male with history of cocaine abuse in the past, tobacco abuse in the past, severe COPD on home oxygen, recently finished rehabilitation for COPD presents with left-sided chest ache that woke him up this morning around 3:00. Patient has had constant chest ache since, this is different than previous episodes, this is different than his COPD. No recent surgeries, blood clot history, unilateral leg symptoms or active cancer. At times worse with movement, no recent exertional chest pain. No coronary artery disease history Patient has family history of cardiac, smoking history. Patient denies high blood pressure or lipids    Patient is a 63 y.o. male presenting with chest pain. The history is provided by the patient.  Chest Pain Associated symptoms: cough (chronic) and shortness of breath (chronic not worse)   Associated symptoms: no abdominal pain, no back pain, no fever, no headache and not vomiting     Past Medical History  Diagnosis Date  . Emphysema   . Polysubstance abuse     stopped using crack/cocaine December 4th 20-14   History reviewed. No pertinent past surgical history. Family History  Problem Relation Age of Onset  . Diabetes Mother   . Heart disease Father   . Heart attack Father   . Cancer - Other Sister    History  Substance Use Topics  . Smoking status: Former Smoker -- 1.50 packs/day for 50 years    Types: Cigarettes    Start date: 04/26/1963    Quit date: 09/22/2013  . Smokeless tobacco: Never Used     Comment: patient using the patch, losenges, and medication  . Alcohol Use: No    Review of Systems  Constitutional: Negative for fever and chills.  HENT: Negative  for congestion.   Eyes: Negative for visual disturbance.  Respiratory: Positive for cough (chronic) and shortness of breath (chronic not worse).   Cardiovascular: Positive for chest pain. Negative for leg swelling.  Gastrointestinal: Negative for vomiting and abdominal pain.  Genitourinary: Negative for dysuria and flank pain.  Musculoskeletal: Negative for back pain, neck pain and neck stiffness.  Skin: Negative for rash.  Neurological: Negative for light-headedness and headaches.      Allergies  Review of patient's allergies indicates no known allergies.  Home Medications   Prior to Admission medications   Medication Sig Start Date End Date Taking? Authorizing Provider  albuterol (PROVENTIL HFA;VENTOLIN HFA) 108 (90 BASE) MCG/ACT inhaler Inhale 2 puffs into the lungs every 6 (six) hours as needed for shortness of breath.   Yes Historical Provider, MD  albuterol (PROVENTIL) (2.5 MG/3ML) 0.083% nebulizer solution Take 2.5 mg by nebulization every 4 (four) hours as needed for wheezing or shortness of breath.    Yes Historical Provider, MD  albuterol-ipratropium (COMBIVENT) 18-103 MCG/ACT inhaler Inhale 1 puff into the lungs every 6 (six) hours as needed for wheezing.    Yes Historical Provider, MD  budesonide-formoterol (SYMBICORT) 160-4.5 MCG/ACT inhaler Inhale 2 puffs into the lungs 2 (two) times daily.     Yes Historical Provider, MD  ibuprofen (ADVIL,MOTRIN) 200 MG tablet Take 800 mg by mouth every 8 (eight) hours as needed for headache or moderate pain.   Yes Historical Provider, MD  OXYGEN Inhale 2 L/min into the  lungs continuous.   Yes Historical Provider, MD  traZODone (DESYREL) 50 MG tablet Take 50 mg by mouth at bedtime.   Yes Historical Provider, MD  vitamin B-12 (CYANOCOBALAMIN) 1000 MCG tablet Take 1,000 mcg by mouth every morning.   Yes Historical Provider, MD  cyclobenzaprine (FLEXERIL) 10 MG tablet Take 1 tablet (10 mg total) by mouth 2 (two) times daily as needed for  muscle spasms. Patient not taking: Reported on 04/11/2014 04/06/13   Lyanne Co, MD  HYDROcodone-acetaminophen (NORCO/VICODIN) 5-325 MG per tablet Take 1 tablet by mouth every 4 (four) hours as needed for moderate pain. Patient not taking: Reported on 04/11/2014 04/06/13   Lyanne Co, MD   BP 154/98 mmHg  Pulse 62  Temp(Src) 97.7 F (36.5 C)  Resp 14  Ht 5\' 5"  (1.651 m)  Wt 170 lb (77.111 kg)  BMI 28.29 kg/m2  SpO2 96% Physical Exam  Constitutional: He is oriented to person, place, and time. He appears well-developed and well-nourished.  HENT:  Head: Normocephalic and atraumatic.  Eyes: Conjunctivae are normal. Right eye exhibits no discharge. Left eye exhibits no discharge.  Neck: Normal range of motion. Neck supple. No tracheal deviation present.  Cardiovascular: Normal rate, regular rhythm and intact distal pulses.   Pulmonary/Chest: Effort normal. He has no wheezes. Rales: decreased breath sounds bilateral, mild tachypnea.  Abdominal: Soft. He exhibits no distension. There is no tenderness. There is no guarding.  Musculoskeletal: He exhibits no edema or tenderness.  Neurological: He is alert and oriented to person, place, and time.  Skin: Skin is warm. No rash noted.  Psychiatric: He has a normal mood and affect.  Nursing note and vitals reviewed.   ED Course  Procedures (including critical care time) Labs Review Labs Reviewed  CBC - Abnormal; Notable for the following:    WBC 3.9 (*)    All other components within normal limits  BASIC METABOLIC PANEL - Abnormal; Notable for the following:    Glucose, Bld 101 (*)    GFR calc non Af Amer 66 (*)    GFR calc Af Amer 76 (*)    All other components within normal limits  TROPONIN I  I-STAT TROPOININ, ED  I-STAT TROPOININ, ED    Imaging Review Dg Chest 2 View  04/11/2014   CLINICAL DATA:  Left side chest pain starting this morning  EXAM: CHEST  2 VIEW  COMPARISON:  01/06/2013  FINDINGS: Cardiomediastinal  silhouette is stable. No acute infiltrate or pleural effusion. No pulmonary edema. Mild linear atelectasis or scarring in lingula. Mild hyperinflation again noted. Bony thorax is unremarkable.  IMPRESSION: No active cardiopulmonary disease.  Mild hyperinflation.   Electronically Signed   By: Natasha Mead M.D.   On: 04/11/2014 12:19     EKG Interpretation   Date/Time:  Friday April 11 2014 10:52:18 EST Ventricular Rate:  67 PR Interval:  122 QRS Duration: 82 QT Interval:  428 QTC Calculation: 452 R Axis:   55 Text Interpretation:  Normal sinus rhythm Normal ECG Confirmed by Kahla Risdon   MD, Tansy Lorek ([id number]) on 04/11/2014 12:14:31 PM      MDM   Final diagnoses:  Acute chest pain   Patient was significant lung disease presents with constant left chest ache, positional at times. Patient has a heart score 3. Plan for pain meds, aspirin, cardiac screen. EKG reviewed no acute findings.  Pt asymptomatic on recheck, no cp, troponins neg, pt comfortable with outpt fup and possible outpt stress test.  Results and  differential diagnosis were discussed with the patient/parent/guardian. Close follow up outpatient was discussed, comfortable with the plan.   Medications  morphine 4 MG/ML injection 6 mg (6 mg Intravenous Given 04/11/14 1248)    Filed Vitals:   04/11/14 1136 04/11/14 1235 04/11/14 1300 04/11/14 1330  BP: 133/94 125/91 152/89 154/98  Pulse: 64  62 62  Temp:      Resp: 23 28 15 14   Height:      Weight:      SpO2: 96%  97% 96%    Final diagnoses:  Acute chest pain       Enid Skeens, MD 04/11/14 [id number]

## 2014-04-11 NOTE — ED Notes (Signed)
Pt is here with left sided chest pain that he woke up with this am and thought it was indigestion.  Pt is on home 02 for COPD.  Started Spiriva last week and then stopped.  Pt is talking in complete sentences

## 2014-04-11 NOTE — ED Notes (Signed)
Patient reports he has stage 4 copd.  He wears oxygen at all times.  He completed pulmonary rehab on yesterday.  Patient denies any recent illness.  States he does have hx of panic attacks but this pain and sx are different.  He states he is concerned that his symbicort can "mess with your heart"   Patient has no swelling noted.  Pulses are strong.  He has taken all his medications this morning.

## 2014-04-11 NOTE — ED Notes (Signed)
MD at bedside. 

## 2014-04-15 ENCOUNTER — Encounter (HOSPITAL_COMMUNITY): Payer: Non-veteran care

## 2014-04-15 NOTE — Progress Notes (Signed)
Pulmonary Rehab Discharge Note: Gerald Williams has been discharged from pulmonary rehab after successfully completing 24 exercise/education sessions. Gerald Williams stated he felt he increased his stamina and strength, however he walked less footage on his discharge walk test than his admission. Gerald Williams was also prescribed continuous O2 during his time here. Gerald Williams states that with his oxygen use, he is able to complete his ADL's with minimal SOB. He has perfected the pursed lip breathing technique and utilizes it during activity. Gerald Williams does not drive. He ambulates to the bus stop and counts that as part of his home exercise. He is enthusiastic about remaining active and is considering coming back to the maintenance program after the first of the year.

## 2014-05-13 ENCOUNTER — Emergency Department (HOSPITAL_COMMUNITY)
Admission: EM | Admit: 2014-05-13 | Discharge: 2014-05-13 | Disposition: A | Discharge status: Home / Self Care | Payer: Non-veteran care | Attending: Emergency Medicine | Admitting: Emergency Medicine

## 2014-05-13 ENCOUNTER — Encounter (HOSPITAL_COMMUNITY): Payer: Self-pay | Admitting: Physical Medicine and Rehabilitation

## 2014-05-13 DIAGNOSIS — Z7951 Long term (current) use of inhaled steroids: Secondary | ICD-10-CM | POA: Diagnosis not present

## 2014-05-13 DIAGNOSIS — M79605 Pain in left leg: Secondary | ICD-10-CM | POA: Insufficient documentation

## 2014-05-13 DIAGNOSIS — M25569 Pain in unspecified knee: Secondary | ICD-10-CM | POA: Diagnosis present

## 2014-05-13 DIAGNOSIS — Z8709 Personal history of other diseases of the respiratory system: Secondary | ICD-10-CM | POA: Diagnosis not present

## 2014-05-13 DIAGNOSIS — Z87891 Personal history of nicotine dependence: Secondary | ICD-10-CM | POA: Diagnosis not present

## 2014-05-13 MED ORDER — OXYCODONE-ACETAMINOPHEN 5-325 MG PO TABS: 1.0000 | ORAL_TABLET | ORAL | Status: DC | PRN

## 2014-05-13 MED ORDER — METHOCARBAMOL 500 MG PO TABS
500.0000 mg | ORAL_TABLET | Freq: Once | ORAL | Status: AC
  Administered 2014-05-13: 500 mg via ORAL
  Filled 2014-05-13: qty 1

## 2014-05-13 MED ORDER — OXYCODONE-ACETAMINOPHEN 5-325 MG PO TABS
2.0000 | ORAL_TABLET | Freq: Once | ORAL | Status: AC
  Administered 2014-05-13: 2 via ORAL
  Filled 2014-05-13: qty 2

## 2014-05-13 NOTE — Discharge Instructions (Signed)
Take the prescribed medication as directed.  You may continue taking her home Flexeril with this medication. Follow-up with your primary care physician. Return to the ED for new or worsening symptoms.

## 2014-05-13 NOTE — ED Notes (Signed)
Patient reports he has a history of peripheral neuropahy., and states it is normal that he has pain in his legs in the mornings when he gets up. States today around lunch while he was sitting at the [facility] he has onset of severe pain in his left lower leg. States pain is only from knee to ankle. Denies injury. Denies prolonged trips. Leg does not appear swollen. States painful to bear wt on leg.

## 2014-05-13 NOTE — ED Notes (Signed)
He advises that his lady friend will be here in about 30 minutes

## 2014-05-13 NOTE — ED Notes (Signed)
Pt is calling for a ride home.

## 2014-05-13 NOTE — ED Notes (Signed)
Pt presents to department for evaluation of L sided knee pain radiating down to foot. Onset today. Denies injury. History of neuropathy. 9/10 pain upon arrival to ED.

## 2014-05-13 NOTE — ED Provider Notes (Signed)
CSN: [id number]     Arrival date & time 05/13/14  1705 History   First MD Initiated Contact with Patient 05/13/14 1735     Chief Complaint  Patient presents with  . Knee Pain     (Consider location/radiation/quality/duration/timing/severity/associated sxs/prior Treatment) Patient is a 64 y.o. male presenting with knee pain. The history is provided by the patient and medical records.  Knee Pain  This is a 64 year old male with past medical history significant for emphysema, peripheral neuropathy, presenting to the ED for left leg pain. Patient states his peripheral neuropathy is generally worse in the morning, but states today after eating lunch his pain became unbearable. He states he was sitting at the [facility] at time his symptoms began.  He denies any injury, trauma, or fall. He states there is a sharp, searing pain extending from his knee all the way down his leg but his leg is not tender to touch.  There is also somewhat of a burning sensation. He denies any swelling. No prior history of DVT or PE.  Patient has remained ambulatory, but states it is painful.  No chest pain or shortness of breath. No intervention tried prior to arrival.  Past Medical History  Diagnosis Date  . Emphysema   . Polysubstance abuse     stopped using crack/cocaine December 4th 20-14   History reviewed. No pertinent past surgical history. Family History  Problem Relation Age of Onset  . Diabetes Mother   . Heart disease Father   . Heart attack Father   . Cancer - Other Sister    History  Substance Use Topics  . Smoking status: Former Smoker -- 1.50 packs/day for 50 years    Types: Cigarettes    Start date: 04/26/1963    Quit date: 09/22/2013  . Smokeless tobacco: Never Used     Comment: patient using the patch, losenges, and medication  . Alcohol Use: No    Review of Systems  Musculoskeletal: Positive for arthralgias.  All other systems reviewed and are negative.     Allergies  Review of  patient's allergies indicates no known allergies.  Home Medications   Prior to Admission medications   Medication Sig Start Date End Date Taking? Authorizing Provider  albuterol (PROVENTIL HFA;VENTOLIN HFA) 108 (90 BASE) MCG/ACT inhaler Inhale 2 puffs into the lungs every 6 (six) hours as needed for shortness of breath.    Historical Provider, MD  albuterol (PROVENTIL) (2.5 MG/3ML) 0.083% nebulizer solution Take 2.5 mg by nebulization every 4 (four) hours as needed for wheezing or shortness of breath.     Historical Provider, MD  albuterol-ipratropium (COMBIVENT) 18-103 MCG/ACT inhaler Inhale 1 puff into the lungs every 6 (six) hours as needed for wheezing.     Historical Provider, MD  budesonide-formoterol (SYMBICORT) 160-4.5 MCG/ACT inhaler Inhale 2 puffs into the lungs 2 (two) times daily.      Historical Provider, MD  cyclobenzaprine (FLEXERIL) 10 MG tablet Take 1 tablet (10 mg total) by mouth 2 (two) times daily as needed for muscle spasms. Patient not taking: Reported on 04/11/2014 04/06/13   Lyanne Co, MD  HYDROcodone-acetaminophen (NORCO/VICODIN) 5-325 MG per tablet Take 1 tablet by mouth every 4 (four) hours as needed for moderate pain. Patient not taking: Reported on 04/11/2014 04/06/13   Lyanne Co, MD  ibuprofen (ADVIL,MOTRIN) 200 MG tablet Take 800 mg by mouth every 8 (eight) hours as needed for headache or moderate pain.    Historical Provider, MD  OXYGEN Inhale 2  L/min into the lungs continuous.    Historical Provider, MD  traZODone (DESYREL) 50 MG tablet Take 50 mg by mouth at bedtime.    Historical Provider, MD  vitamin B-12 (CYANOCOBALAMIN) 1000 MCG tablet Take 1,000 mcg by mouth every morning.    Historical Provider, MD   BP 131/79 mmHg  Pulse 81  Temp(Src) 97.9 F (36.6 C) (Oral)  Resp 20  SpO2 94%   Physical Exam  Constitutional: He is oriented to person, place, and time. He appears well-developed and well-nourished. No distress.  HENT:  Head: Normocephalic  and atraumatic.  Mouth/Throat: Oropharynx is clear and moist.  Eyes: Conjunctivae and EOM are normal. Pupils are equal, round, and reactive to light.  Neck: Normal range of motion. Neck supple.  Cardiovascular: Normal rate, regular rhythm and normal heart sounds.   Pulmonary/Chest: Effort normal and breath sounds normal. No respiratory distress. He has no wheezes.  Musculoskeletal: Normal range of motion.  Left leg normal in appearance without bony deformities No calf asymmetry, tenderness, or palpable cords; no overlying erythema or warmth to touch; negative Homans sign bilaterally  Neurological: He is alert and oriented to person, place, and time.  Skin: Skin is warm and dry. He is not diaphoretic.  Psychiatric: He has a normal mood and affect.  Nursing note and vitals reviewed.   ED Course  Procedures (including critical care time) Labs Review Labs Reviewed - No data to display  Imaging Review No results found.   EKG Interpretation None      MDM   Final diagnoses:  Leg pain, left   64 year old male with likely exacerbation of his peripheral neuropathy. He has no clinical signs of DVT on exam.  Patient treated with percocet in the ED, d/c home with short supply of same. Patient to FU with his PCP.  Discussed plan with patient, he/she acknowledged understanding and agreed with plan of care.  Return precautions given for new or worsening symptoms.  Garlon Hatchet, PA-C 05/13/14 1928  Arby Barrette, MD 05/13/14 2237

## 2014-07-14 ENCOUNTER — Emergency Department (HOSPITAL_COMMUNITY): Payer: Non-veteran care

## 2014-07-14 ENCOUNTER — Encounter (HOSPITAL_COMMUNITY): Payer: Self-pay | Admitting: Family Medicine

## 2014-07-14 ENCOUNTER — Observation Stay (HOSPITAL_COMMUNITY)
Admission: EM | Admit: 2014-07-14 | Discharge: 2014-07-16 | DRG: 287 | Disposition: A | Discharge status: Home / Self Care | Payer: Non-veteran care | Attending: Cardiovascular Disease | Admitting: Cardiovascular Disease

## 2014-07-14 DIAGNOSIS — R7303 Prediabetes: Secondary | ICD-10-CM

## 2014-07-14 DIAGNOSIS — E785 Hyperlipidemia, unspecified: Secondary | ICD-10-CM | POA: Diagnosis present

## 2014-07-14 DIAGNOSIS — I200 Unstable angina: Principal | ICD-10-CM | POA: Diagnosis present

## 2014-07-14 DIAGNOSIS — Z7982 Long term (current) use of aspirin: Secondary | ICD-10-CM | POA: Diagnosis not present

## 2014-07-14 DIAGNOSIS — R7309 Other abnormal glucose: Secondary | ICD-10-CM | POA: Diagnosis present

## 2014-07-14 DIAGNOSIS — R079 Chest pain, unspecified: Secondary | ICD-10-CM | POA: Diagnosis present

## 2014-07-14 DIAGNOSIS — Z87891 Personal history of nicotine dependence: Secondary | ICD-10-CM

## 2014-07-14 DIAGNOSIS — R319 Hematuria, unspecified: Secondary | ICD-10-CM | POA: Diagnosis present

## 2014-07-14 DIAGNOSIS — G4733 Obstructive sleep apnea (adult) (pediatric): Secondary | ICD-10-CM | POA: Diagnosis not present

## 2014-07-14 DIAGNOSIS — J449 Chronic obstructive pulmonary disease, unspecified: Secondary | ICD-10-CM | POA: Diagnosis not present

## 2014-07-14 DIAGNOSIS — I739 Peripheral vascular disease, unspecified: Secondary | ICD-10-CM | POA: Diagnosis not present

## 2014-07-14 DIAGNOSIS — Z87898 Personal history of other specified conditions: Secondary | ICD-10-CM | POA: Diagnosis not present

## 2014-07-14 DIAGNOSIS — Z9989 Dependence on other enabling machines and devices: Secondary | ICD-10-CM

## 2014-07-14 DIAGNOSIS — Z8249 Family history of ischemic heart disease and other diseases of the circulatory system: Secondary | ICD-10-CM

## 2014-07-14 DIAGNOSIS — R310 Gross hematuria: Secondary | ICD-10-CM | POA: Diagnosis not present

## 2014-07-14 DIAGNOSIS — R001 Bradycardia, unspecified: Secondary | ICD-10-CM | POA: Diagnosis present

## 2014-07-14 DIAGNOSIS — Z79899 Other long term (current) drug therapy: Secondary | ICD-10-CM

## 2014-07-14 HISTORY — DX: Sleep apnea, unspecified: G47.30

## 2014-07-14 HISTORY — DX: Hyperlipidemia, unspecified: E78.5

## 2014-07-14 LAB — CBC
HCT: 37.8 % — ABNORMAL LOW (ref 39.0–52.0)
Hemoglobin: 12.7 g/dL — ABNORMAL LOW (ref 13.0–17.0)
MCH: 29.8 pg (ref 26.0–34.0)
MCHC: 33.6 g/dL (ref 30.0–36.0)
MCV: 88.7 fL (ref 78.0–100.0)
Platelets: 192 K/uL (ref 150–400)
RBC: 4.26 MIL/uL (ref 4.22–5.81)
RDW: 14.1 % (ref 11.5–15.5)
WBC: 4.2 K/uL (ref 4.0–10.5)

## 2014-07-14 LAB — HEPARIN LEVEL (UNFRACTIONATED): HEPARIN UNFRACTIONATED: 0.93 [IU]/mL — AB (ref 0.30–0.70)

## 2014-07-14 LAB — MRSA PCR SCREENING: MRSA by PCR: NEGATIVE

## 2014-07-14 LAB — I-STAT TROPONIN, ED
Troponin i, poc: 0 ng/mL (ref 0.00–0.08)
Troponin i, poc: 0 ng/mL (ref 0.00–0.08)

## 2014-07-14 LAB — BASIC METABOLIC PANEL
Anion gap: 8 (ref 5–15)
BUN: 15 mg/dL (ref 6–23)
CHLORIDE: 108 mmol/L (ref 96–112)
CO2: 27 mmol/L (ref 19–32)
Calcium: 8.8 mg/dL (ref 8.4–10.5)
Creatinine, Ser: 1.34 mg/dL (ref 0.50–1.35)
GFR calc Af Amer: 64 mL/min — ABNORMAL LOW (ref >90)
GFR calc non Af Amer: 55 mL/min — ABNORMAL LOW (ref >90)
Glucose, Bld: 106 mg/dL — ABNORMAL HIGH (ref 70–99)
POTASSIUM: 3.8 mmol/L (ref 3.5–5.1)
Sodium: 143 mmol/L (ref 135–145)

## 2014-07-14 LAB — TROPONIN I: Troponin I: <0.03 ng/mL (ref <0.031)

## 2014-07-14 LAB — GLUCOSE, CAPILLARY: GLUCOSE-CAPILLARY: 115 mg/dL — AB (ref 70–99)

## 2014-07-14 MED ORDER — NITROGLYCERIN IN D5W 200-5 MCG/ML-% IV SOLN
2.0000 ug/min | INTRAVENOUS | Status: DC
  Administered 2014-07-14: 5 ug/min via INTRAVENOUS
  Filled 2014-07-14: qty 250

## 2014-07-14 MED ORDER — ASPIRIN 81 MG PO CHEW
81.0000 mg | CHEWABLE_TABLET | ORAL | Status: AC
  Administered 2014-07-15: 81 mg via ORAL
  Filled 2014-07-14: qty 1

## 2014-07-14 MED ORDER — SODIUM CHLORIDE 0.9 % IV SOLN
1.0000 mL/kg/h | INTRAVENOUS | Status: DC
Start: 2014-07-15 — End: 2014-07-15

## 2014-07-14 MED ORDER — CARVEDILOL 6.25 MG PO TABS
6.2500 mg | ORAL_TABLET | Freq: Two times a day (BID) | ORAL | Status: DC
  Administered 2014-07-15: 6.25 mg via ORAL
  Filled 2014-07-14 (×7): qty 1

## 2014-07-14 MED ORDER — ASPIRIN 81 MG PO CHEW
324.0000 mg | CHEWABLE_TABLET | Freq: Once | ORAL | Status: AC
  Administered 2014-07-14: 324 mg via ORAL
  Filled 2014-07-14: qty 4

## 2014-07-14 MED ORDER — SODIUM CHLORIDE 0.9 % IJ SOLN
3.0000 mL | Freq: Two times a day (BID) | INTRAMUSCULAR | Status: DC
  Administered 2014-07-14: 3 mL via INTRAVENOUS

## 2014-07-14 MED ORDER — HEPARIN (PORCINE) IN NACL 100-0.45 UNIT/ML-% IJ SOLN
950.0000 [IU]/h | INTRAMUSCULAR | Status: DC
  Administered 2014-07-14: 1100 [IU]/h via INTRAVENOUS
  Filled 2014-07-14 (×2): qty 250

## 2014-07-14 MED ORDER — ALBUTEROL SULFATE (2.5 MG/3ML) 0.083% IN NEBU: 2.5000 mg | INHALATION_SOLUTION | RESPIRATORY_TRACT | Status: DC | PRN

## 2014-07-14 MED ORDER — ASPIRIN EC 81 MG PO TBEC: 81.0000 mg | DELAYED_RELEASE_TABLET | Freq: Every day | ORAL | Status: DC

## 2014-07-14 MED ORDER — HEPARIN BOLUS VIA INFUSION
4000.0000 [IU] | Freq: Once | INTRAVENOUS | Status: AC
  Administered 2014-07-14: 4000 [IU] via INTRAVENOUS
  Filled 2014-07-14: qty 4000

## 2014-07-14 MED ORDER — MORPHINE SULFATE 4 MG/ML IJ SOLN
4.0000 mg | Freq: Once | INTRAMUSCULAR | Status: AC
  Administered 2014-07-14: 4 mg via INTRAVENOUS
  Filled 2014-07-14: qty 1

## 2014-07-14 MED ORDER — ATORVASTATIN CALCIUM 80 MG PO TABS
80.0000 mg | ORAL_TABLET | Freq: Every day | ORAL | Status: DC
  Administered 2014-07-14 – 2014-07-15 (×2): 80 mg via ORAL
  Filled 2014-07-14 (×4): qty 1

## 2014-07-14 MED ORDER — SODIUM CHLORIDE 0.9 % IV SOLN
250.0000 mL | INTRAVENOUS | Status: DC | PRN
  Administered 2014-07-14: 10 mL via INTRAVENOUS

## 2014-07-14 MED ORDER — INSULIN ASPART 100 UNIT/ML ~~LOC~~ SOLN: 0.0000 [IU] | Freq: Three times a day (TID) | SUBCUTANEOUS | Status: DC

## 2014-07-14 MED ORDER — ACETAMINOPHEN 325 MG PO TABS
650.0000 mg | ORAL_TABLET | ORAL | Status: DC | PRN
  Administered 2014-07-14: 650 mg via ORAL
  Filled 2014-07-14: qty 2

## 2014-07-14 MED ORDER — NITROGLYCERIN 0.4 MG SL SUBL: 0.4000 mg | SUBLINGUAL_TABLET | SUBLINGUAL | Status: DC | PRN

## 2014-07-14 MED ORDER — ONDANSETRON HCL 4 MG/2ML IJ SOLN
4.0000 mg | Freq: Four times a day (QID) | INTRAMUSCULAR | Status: DC | PRN
Start: 2014-07-14 — End: 2014-07-15

## 2014-07-14 MED ORDER — ONDANSETRON HCL 4 MG/2ML IJ SOLN
4.0000 mg | Freq: Once | INTRAMUSCULAR | Status: AC
  Administered 2014-07-14: 4 mg via INTRAVENOUS
  Filled 2014-07-14: qty 2

## 2014-07-14 MED ORDER — BUDESONIDE-FORMOTEROL FUMARATE 160-4.5 MCG/ACT IN AERO
2.0000 | INHALATION_SPRAY | Freq: Two times a day (BID) | RESPIRATORY_TRACT | Status: DC
Start: 2014-07-14 — End: 2014-07-16
  Administered 2014-07-15 – 2014-07-16 (×2): 2 via RESPIRATORY_TRACT
  Filled 2014-07-14 (×2): qty 6

## 2014-07-14 MED ORDER — SODIUM CHLORIDE 0.9 % IJ SOLN: 3.0000 mL | INTRAMUSCULAR | Status: DC | PRN

## 2014-07-14 NOTE — ED Notes (Signed)
Pt standing in room. Tired of lying. Paged Trish to find out when he will be seen. Pt states he's tired of waiting and will leave and go to [facility]. Informed pt per Rosann Auerbach he will be seen within the next 30 minutes.

## 2014-07-14 NOTE — ED Provider Notes (Signed)
CSN: [id number]     Arrival date & time 07/14/14  4098 History   First MD Initiated Contact with Patient 07/14/14 1001     Chief Complaint  Patient presents with  . Chest Pain     (Consider location/radiation/quality/duration/timing/severity/associated sxs/prior Treatment) HPI Edgel Degnan is a 64 y.o. male with hx of COPD, presents  To ED with complaint of chest pain. Pt states he has had daily chest pains for "months." States followed by [place]. Had stress test done  On 07/02/14 which showed stress induced ischemia. [facility] states [facility] told him he needs to see a cardiologist. States in the last two days, pain became more severe. He took 4 SL Nitros yesterday and 2 today with no improvement. States became worried so came to ED. He states pain is pressure like, constant, sometimes worse with exertion, sometimes worse at night. States has SOB on exertion too but admits this to be due to his COPD.    Past Medical History  Diagnosis Date  . Emphysema   . Polysubstance abuse     stopped using crack/cocaine December 4th 20-14   History reviewed. No pertinent past surgical history. Family History  Problem Relation Age of Onset  . Diabetes Mother   . Heart disease Father   . Heart attack Father   . Cancer - Other Sister    History  Substance Use Topics  . Smoking status: Former Smoker -- 1.50 packs/day for 50 years    Types: Cigarettes    Start date: 04/26/1963    Quit date: 09/22/2013  . Smokeless tobacco: Never Used     Comment: patient using the patch, losenges, and medication  . Alcohol Use: No    Review of Systems  Constitutional: Negative for fever and chills.  Respiratory: Positive for chest tightness and shortness of breath. Negative for cough.   Cardiovascular: Positive for chest pain. Negative for palpitations and leg swelling.  Gastrointestinal: Negative for nausea, vomiting, abdominal pain, diarrhea and abdominal distention.  Genitourinary: Negative for dysuria,  urgency, frequency and hematuria.  Musculoskeletal: Negative for myalgias, arthralgias, neck pain and neck stiffness.  Skin: Negative for rash.  Allergic/Immunologic: Negative for immunocompromised state.  Neurological: Negative for dizziness, weakness, light-headedness, numbness and headaches.  All other systems reviewed and are negative.     Allergies  Review of patient's allergies indicates no known allergies.  Home Medications   Prior to Admission medications   Medication Sig Start Date End Date Taking? Authorizing Provider  albuterol (PROVENTIL HFA;VENTOLIN HFA) 108 (90 BASE) MCG/ACT inhaler Inhale 2 puffs into the lungs every 6 (six) hours as needed for shortness of breath.    Historical Provider, MD  albuterol (PROVENTIL) (2.5 MG/3ML) 0.083% nebulizer solution Take 2.5 mg by nebulization every 4 (four) hours as needed for wheezing or shortness of breath.     Historical Provider, MD  albuterol-ipratropium (COMBIVENT) 18-103 MCG/ACT inhaler Inhale 1 puff into the lungs every 6 (six) hours as needed for wheezing.     Historical Provider, MD  budesonide-formoterol (SYMBICORT) 160-4.5 MCG/ACT inhaler Inhale 2 puffs into the lungs 2 (two) times daily.      Historical Provider, MD  cyclobenzaprine (FLEXERIL) 10 MG tablet Take 1 tablet (10 mg total) by mouth 2 (two) times daily as needed for muscle spasms. Patient not taking: Reported on 04/11/2014 04/06/13   Azalia Bilis, MD  HYDROcodone-acetaminophen (NORCO/VICODIN) 5-325 MG per tablet Take 1 tablet by mouth every 4 (four) hours as needed for moderate pain. Patient not  taking: Reported on 04/11/2014 04/06/13   Azalia Bilis, MD  ibuprofen (ADVIL,MOTRIN) 200 MG tablet Take 800 mg by mouth every 8 (eight) hours as needed for headache or moderate pain.    Historical Provider, MD  oxyCODONE-acetaminophen (PERCOCET/ROXICET) 5-325 MG per tablet Take 1 tablet by mouth every 4 (four) hours as needed. 05/13/14   Garlon Hatchet, PA-C  OXYGEN Inhale 2  L/min into the lungs continuous.    Historical Provider, MD  traZODone (DESYREL) 50 MG tablet Take 50 mg by mouth at bedtime.    Historical Provider, MD  vitamin B-12 (CYANOCOBALAMIN) 1000 MCG tablet Take 1,000 mcg by mouth every morning.    Historical Provider, MD   BP 110/84 mmHg  Pulse 59  Temp(Src) 98.6 F (37 C)  Resp 18 Physical Exam  Constitutional: He is oriented to person, place, and time. He appears well-developed and well-nourished. No distress.  HENT:  Head: Normocephalic and atraumatic.  Eyes: Conjunctivae are normal.  Neck: Neck supple.  Cardiovascular: Normal rate, regular rhythm, normal heart sounds and intact distal pulses.   No murmur heard. Pulmonary/Chest: Effort normal. No respiratory distress. He has no wheezes. He has no rales. He exhibits no tenderness.  Abdominal: Soft. Bowel sounds are normal. He exhibits no distension. There is no tenderness. There is no rebound.  Musculoskeletal: He exhibits no edema.  Neurological: He is alert and oriented to person, place, and time.  Skin: Skin is warm and dry.  Nursing note and vitals reviewed.   ED Course  Procedures (including critical care time) Labs Review Labs Reviewed  CBC - Abnormal; Notable for the following:    Hemoglobin 12.7 (*)    HCT 37.8 (*)    All other components within normal limits  BASIC METABOLIC PANEL - Abnormal; Notable for the following:    Glucose, Bld 106 (*)    GFR calc non Af Amer 55 (*)    GFR calc Af Amer 64 (*)    All other components within normal limits  HEPARIN LEVEL (UNFRACTIONATED)  I-STAT TROPOININ, ED  Rosezena Sensor, ED    Imaging Review Dg Chest 2 View  07/14/2014   CLINICAL DATA:  Left posterior chest pain. Cough and congestion. History of emphysema. History of smoking.  EXAM: CHEST  2 VIEW  COMPARISON:  04/11/2014; 01/06/2013; 08/24/2011  FINDINGS: Grossly unchanged cardiac silhouette and mediastinal contours. The lungs are hyperexpanded with flattening of the  bilateral hemidiaphragms and mild diffuse slightly nodular thickening of the pulmonary interstitium. Linear heterogeneous opacities radiating from the right hilum are unchanged and favored to represent atelectasis or scar. No new focal airspace opacities. No pleural effusion pneumothorax. No evidence of edema. No acute osseus abnormalities.  IMPRESSION: Hyperexpanded lungs without acute cardiopulmonary disease.   Electronically Signed   By: Simonne Come M.D.   On: 07/14/2014 10:28     EKG Interpretation   Date/Time:  Monday July 14 2014 09:59:30 EDT Ventricular Rate:  56 PR Interval:  108 QRS Duration: 86 QT Interval:  421 QTC Calculation: 406 R Axis:   56 Text Interpretation:  Sinus rhythm Short PR interval Baseline wander in  lead(s) V5 V6 No significant change was found Confirmed by CAMPOS  MD,  KEVIN ([id number]) on 07/14/2014 10:11:09 AM      MDM   Final diagnoses:  Chest pain, unspecified chest pain type    10:12 AM  Pt seen and examined, pt with abnormal Stress test through [place] on 07/02/14, which showed large myocardial perfusion defect in inferior  and inferoseptal segments. Pt having active CP, no relief with SL nitro at home. Will start on morphine, aspirin ordered. Labs and CXR pending.   11:59 AM Spoke with Cardiology, will consult.  Work up unremarkable at this time. Pt's pain improving but still there.   4:29 PM Pt will be admitted by cardiology.   Filed Vitals:   07/14/14 1330 07/14/14 1515 07/14/14 1545 07/14/14 1608  BP: 108/63 131/86 136/81 147/124  Pulse: 50 40 51 63  Temp:      Resp: Height:     (1.651 m)  Weight:    169 lb 5 oz (76.8 kg)  SpO2: 96% 92% 97% 97%     Jaynie Crumble, PA-C 07/14/14 1629  Azalia Bilis, MD 07/15/14 [phone]

## 2014-07-14 NOTE — ED Notes (Signed)
Dinner tray ordered.

## 2014-07-14 NOTE — Progress Notes (Signed)
ANTICOAGULATION CONSULT NOTE - Initial Consult  Pharmacy Consult for Heparin Indication: chest pain/ACS  No Known Allergies  Patient Measurements: Height: 5\' 5"  (165.1 cm) Weight: 169 lb 5 oz (76.8 kg) IBW/kg (Calculated) : 61.5 Heparin Dosing Weight: 76.8 kg  Vital Signs: Temp: 98.6 F (37 C) (03/21 1004) BP: 108/63 mmHg (03/21 1330) Pulse Rate: 50 (03/21 1330)  Labs:  Recent Labs  07/14/14 1015  HGB 12.7*  HCT 37.8*  PLT 192  CREATININE 1.34    CrCl cannot be calculated (Unknown ideal weight.).   Medical History: Past Medical History  Diagnosis Date  . Emphysema   . Polysubstance abuse     stopped using crack/cocaine December 4th 20-14    Medications:   (Not in a hospital admission) Scheduled:   Infusions:  . nitroGLYCERIN      Assessment: 64yo male with history of polysubstance abuse and emphysema presents with chest pain. Pharmacy is consulted to dose heparin for ACS/chest pain. Hgb 12.7, Plt 192, sCr 1.34, Trop neg x1.  Goal of Therapy:  Heparin level 0.3-0.7 units/ml Monitor platelets by anticoagulation protocol: Yes   Plan:  Give 4000 units bolus x 1 Start heparin infusion at 1100 units/hr Check anti-Xa level in 6 hours and daily while on heparin Continue to monitor H&H and platelets  Arlean Hopping. Newman Pies, PharmD Clinical Pharmacist Pager [phone] 07/14/2014,4:02 PM

## 2014-07-14 NOTE — H&P (Signed)
Gerald Williams is an 64 y.o. male.   Chief Complaint: Chest pain HPI:   The patient is a 64 yo AA male who is retired from the [employer].  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   His history includes cocaine abuse, tobacco abuse(neither of which he does any more), ETOH use, COPD, OSA, prediabetes, HLD.  His father had five MIs before he passed away at age 57.  He reports being seen at the [place] and having a nuclear stress test on 07/01/14.  This was read as abnormal, large myocardial perfusion defect in the inferior and inferoseptal segments-EF 56%.  He was given sublingual NTG which he uses multiple times daily and which relieves the pain.  Unfortunately it comes back.  He was also started on ASA, Coreg and lipitor.  SOB worse than baseline.  His pain is sharpe and starts on the far left near the axillary line.  Worse with exersion so he doesn't walk much.   It was 6-7/10 before morphine and 4/10 now.  He had a coloscopy last year with three polyps removed but otherwise normal.  Some rectal bleeding last week.  He was started on a CPAP three months ago.  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     Medications Medication Sig  albuterol (PROVENTIL HFA;VENTOLIN HFA) 108 (90 BASE) MCG/ACT inhaler Inhale 2 puffs into the lungs every 6 (six) hours as needed for shortness of breath.  albuterol (PROVENTIL) (2.5 MG/3ML) 0.083% nebulizer solution Take 2.5 mg by nebulization every 4 (four) hours as needed for wheezing or shortness of breath.   albuterol-ipratropium (COMBIVENT) 18-103 MCG/ACT inhaler Inhale 1 puff into the lungs every 6 (six) hours as needed for wheezing.   budesonide-formoterol (SYMBICORT) 160-4.5 MCG/ACT inhaler Inhale 2 puffs into the lungs 2 (two) times daily.    cyclobenzaprine (FLEXERIL) 10 MG tablet Take 1 tablet (10 mg total) by mouth 2 (two) times daily as needed for muscle spasms. Patient not taking:  Reported on 04/11/2014  HYDROcodone-acetaminophen (NORCO/VICODIN) 5-325 MG per tablet Take 1 tablet by mouth every 4 (four) hours as needed for moderate pain. Patient not taking: Reported on 04/11/2014  ibuprofen (ADVIL,MOTRIN) 200 MG tablet Take 800 mg by mouth every 8 (eight) hours as needed for headache or moderate pain.  oxyCODONE-acetaminophen (PERCOCET/ROXICET) 5-325 MG per tablet Take 1 tablet by mouth every 4 (four) hours as needed.  OXYGEN Inhale 2 L/min into the lungs continuous.  traZODone (DESYREL) 50 MG tablet Take 50 mg by mouth at bedtime.  vitamin B-12 (CYANOCOBALAMIN) 1000 MCG tablet Take 1,000 mcg by mouth every morning.   ASA  31m daily Lipitor 872mdaily Coreg 6.25 BID SL NTG 0.9m29m5mi60mRN x 3  Past Medical History  Diagnosis Date  . Emphysema   . Polysubstance abuse     stopped using crack/cocaine December 4th 20-14    History reviewed. No pertinent past surgical history.  Family History  Problem Relation Age of Onset  . Diabetes Mother   . Heart disease Father   . Heart attack Father   . Cancer - Other Sister    Social History:  reports that he quit smoking about 9 months ago. His smoking use included Cigarettes. He started smoking about 51 years ago. He has a 75 pack-year smoking history. He has never used smokeless tobacco. He reports that he does not drink alcohol or use  illicit drugs.  Allergies: No Known Allergies   (Not in a hospital admission)  Results for orders placed or performed during the hospital encounter of 07/14/14 (from the past 48 hour(s))  CBC     Status: Abnormal   Collection Time: 07/14/14 10:15 AM  Result Value Ref Range   WBC 4.2 4.0 - 10.5 K/uL   RBC 4.26 4.22 - 5.81 MIL/uL   Hemoglobin 12.7 (L) 13.0 - 17.0 g/dL   HCT 37.8 (L) 39.0 - 52.0 %   MCV 88.7 78.0 - 100.0 fL   MCH 29.8 26.0 - 34.0 pg   MCHC 33.6 30.0 - 36.0 g/dL   RDW 14.1 11.5 - 15.5 %   Platelets 192 150 - 400 K/uL  Basic metabolic panel     Status: Abnormal    Collection Time: 07/14/14 10:15 AM  Result Value Ref Range   Sodium 143 135 - 145 mmol/L   Potassium 3.8 3.5 - 5.1 mmol/L   Chloride 108 96 - 112 mmol/L   CO2 27 19 - 32 mmol/L   Glucose, Bld 106 (H) 70 - 99 mg/dL   BUN 15 6 - 23 mg/dL   Creatinine, Ser 1.34 0.50 - 1.35 mg/dL   Calcium 8.8 8.4 - 10.5 mg/dL   GFR calc non Af Amer 55 (L) >90 mL/min   GFR calc Af Amer 64 (L) >90 mL/min    Comment: (NOTE) The eGFR has been calculated using the CKD EPI equation. This calculation has not been validated in all clinical situations. eGFR's persistently <90 mL/min signify possible Chronic Kidney Disease.    Anion gap 8 5 - 15  I-stat troponin, ED (not at [facility])     Status: None   Collection Time: 07/14/14 10:23 AM  Result Value Ref Range   Troponin i, poc 0.00 0.00 - 0.08 ng/mL   Comment 3            Comment: Due to the release kinetics of cTnI, a negative result within the first hours of the onset of symptoms does not rule out myocardial infarction with certainty. If myocardial infarction is still suspected, repeat the test at appropriate intervals.   I-Stat Troponin, ED (not at [facility])     Status: None   Collection Time: 07/14/14  4:01 PM  Result Value Ref Range   Troponin i, poc 0.00 0.00 - 0.08 ng/mL   Comment 3            Comment: Due to the release kinetics of cTnI, a negative result within the first hours of the onset of symptoms does not rule out myocardial infarction with certainty. If myocardial infarction is still suspected, repeat the test at appropriate intervals.    Dg Chest 2 View  07/14/2014   CLINICAL DATA:  Left posterior chest pain. Cough and congestion. History of emphysema. History of smoking.  EXAM: CHEST  2 VIEW  COMPARISON:  04/11/2014; 01/06/2013; 08/24/2011  FINDINGS: Grossly unchanged cardiac silhouette and mediastinal contours. The lungs are hyperexpanded with flattening of the bilateral hemidiaphragms and mild diffuse slightly nodular thickening of the  pulmonary interstitium. Linear heterogeneous opacities radiating from the right hilum are unchanged and favored to represent atelectasis or scar. No new focal airspace opacities. No pleural effusion pneumothorax. No evidence of edema. No acute osseus abnormalities.  IMPRESSION: Hyperexpanded lungs without acute cardiopulmonary disease.   Electronically Signed   By: Sandi Mariscal M.D.   On: 07/14/2014 10:28    Review of Systems  Constitutional: Negative for fever  and diaphoresis.  HENT: Positive for congestion (Recently but resolved. ).   Respiratory: Positive for cough (Resently but resolved.) and shortness of breath.   Cardiovascular: Positive for chest pain and claudication. Negative for orthopnea, leg swelling and PND.  Gastrointestinal: Positive for blood in stool. Negative for nausea, vomiting, abdominal pain and melena.  Genitourinary:       Poly uria  Musculoskeletal: Negative for myalgias.  Neurological: Positive for dizziness (Just today while in the ER).  All other systems reviewed and are negative.   Blood pressure 147/124, pulse 63, temperature 98.6 F (37 C), resp. rate 18, height '5\' 5"'  (1.651 m), weight 169 lb 5 oz (76.8 kg), SpO2 97 %. Physical Exam  Nursing note and vitals reviewed. Constitutional: He is oriented to person, place, and time. He appears well-developed and well-nourished. No distress.  HENT:  Head: Normocephalic and atraumatic.  Mouth/Throat: No oropharyngeal exudate.  Eyes: EOM are normal. Pupils are equal, round, and reactive to light. No scleral icterus.  Neck: Normal range of motion. Neck supple.  Cardiovascular: Regular rhythm, S1 normal and S2 normal.  Bradycardia present.   No murmur heard. Pulses:      Radial pulses are 2+ on the right side, and 2+ on the left side.       Dorsalis pedis pulses are 0 on the right side, and 2+ on the left side.       Posterior tibial pulses are 1+ on the right side, and 2+ on the left side.  No carotis bruits    Respiratory: Effort normal. He has wheezes. He has no rales.  Decreased BS bilaterally.  Slight wheeze  GI: Soft. Bowel sounds are normal. He exhibits distension. There is no tenderness.  Musculoskeletal: He exhibits no edema.  Lymphadenopathy:    He has no cervical adenopathy.  Neurological: He is alert and oriented to person, place, and time. He exhibits normal muscle tone.  Skin: Skin is warm and dry.  Psychiatric: He has a normal mood and affect.     Assessment/Plan Principal Problem:   Unstable angina Active Problems:   History of tobacco abuse:  quit in ~ 06/2013    OSA on CPAP   Pre-diabetes: last A1C 6.2 (07/01/14)   Claudication, R>L  Plan: The patient was started on IV NTG and heparin.  He will need a heart cath tomorrow.  Continue ASA, BB, statin.  Lipids 07/01/14:  TC 189, TG 185, LDL 110, HDL 71.  He will need a left heart cath tomorrow.  We will also check LE ABI's due to claudication.  CBG checks ACHS.  SS insulin if needed.  CPAP nightly.    Tarri Fuller, [place]  07/14/2014, 4:25 PM   I have seen and examined the patient along with HAGER, BRYAN, PAC.  I have reviewed the chart, notes and new data.  I agree with PA's note.  Key new complaints: typical angina pectoris, crescendo pattern. Right calf  intermittent claudication Key examination changes: no signs of CHF; absent right pedal pulses Key new findings / data: inferior wall ischemia by stress nuclear study at the [place]  PLAN: Cardiac cath and possible PCI in AM This procedure has been fully reviewed with the patient and written informed consent has been obtained. Noninvasive evaluation for PAD of lower extremities Risck factor modification. After PCI, reduce beta blocker dose - he has symptomatic bradycardia.  Sanda Klein, MD, [place] [phone] 07/14/2014, 4:59 PM

## 2014-07-14 NOTE — Progress Notes (Signed)
ANTICOAGULATION CONSULT NOTE  Pharmacy Consult for Heparin Indication: chest pain/ACS  No Known Allergies  Patient Measurements: Height: 5\' 5"  (165.1 cm) Weight: 169 lb 1.5 oz (76.7 kg) IBW/kg (Calculated) : 61.5 Heparin Dosing Weight: 76.8 kg  Vital Signs: Temp: 97.6 F (36.4 C) (03/21 1900) Temp Source: Oral (03/21 1900) BP: 125/76 mmHg (03/21 2300) Pulse Rate: 55 (03/21 2300)  Labs:  Recent Labs  07/14/14 1015 07/14/14 1936 07/14/14 2245  HGB 12.7*  --   --   HCT 37.8*  --   --   PLT 192  --   --   HEPARINUNFRC  --   --  0.93*  CREATININE 1.34  --   --   TROPONINI  --  <0.03  --     Estimated Creatinine Clearance: 54.7 mL/min (by C-G formula based on Cr of 1.34).  Assessment: 64 y.o. male with chest pain for heparin   Goal of Therapy:  Heparin level 0.3-0.7 units/ml Monitor platelets by anticoagulation protocol: Yes   Plan:  Decrease Heparin 950 units/hr  Follow-up am labs.    Geannie Risen, PharmD, BCPS  07/14/2014,11:55 PM

## 2014-07-14 NOTE — ED Notes (Signed)
Report attempted 

## 2014-07-14 NOTE — ED Notes (Signed)
Pt here for chest pain over the past few weeks. sts he has been taking nitro with minimal relief. sts took 4 yesterday. sts headache due to nitro.

## 2014-07-14 NOTE — ED Notes (Signed)
Tatyana, PA at the bedside  

## 2014-07-14 NOTE — ED Notes (Signed)
Patient transported to X-ray 

## 2014-07-15 ENCOUNTER — Encounter (HOSPITAL_COMMUNITY)
Admission: EM | Disposition: A | Discharge status: Home / Self Care | Payer: Self-pay | Source: Home / Self Care | Attending: Emergency Medicine

## 2014-07-15 DIAGNOSIS — R319 Hematuria, unspecified: Secondary | ICD-10-CM | POA: Diagnosis present

## 2014-07-15 DIAGNOSIS — R079 Chest pain, unspecified: Secondary | ICD-10-CM

## 2014-07-15 HISTORY — PX: LEFT HEART CATHETERIZATION WITH CORONARY ANGIOGRAM: SHX5451

## 2014-07-15 LAB — TROPONIN I: Troponin I: <0.03 ng/mL (ref <0.031)

## 2014-07-15 LAB — CBC
HCT: 33.9 % — ABNORMAL LOW (ref 39.0–52.0)
Hemoglobin: 11.5 g/dL — ABNORMAL LOW (ref 13.0–17.0)
MCH: 30.1 pg (ref 26.0–34.0)
MCHC: 33.9 g/dL (ref 30.0–36.0)
MCV: 88.7 fL (ref 78.0–100.0)
PLATELETS: 168 10*3/uL (ref 150–400)
RBC: 3.82 MIL/uL — AB (ref 4.22–5.81)
RDW: 14.2 % (ref 11.5–15.5)
WBC: 4.8 10*3/uL (ref 4.0–10.5)

## 2014-07-15 LAB — GLUCOSE, CAPILLARY
GLUCOSE-CAPILLARY: 91 mg/dL (ref 70–99)
GLUCOSE-CAPILLARY: 89 mg/dL (ref 70–99)

## 2014-07-15 LAB — PROTIME-INR
INR: 1.12 (ref 0.00–1.49)
Prothrombin Time: 14.5 seconds (ref 11.6–15.2)

## 2014-07-15 SURGERY — LEFT HEART CATHETERIZATION WITH CORONARY ANGIOGRAM
Anesthesia: LOCAL

## 2014-07-15 MED ORDER — LIDOCAINE HCL (PF) 1 % IJ SOLN
INTRAMUSCULAR | Status: AC
  Filled 2014-07-15: qty 30

## 2014-07-15 MED ORDER — VERAPAMIL HCL 2.5 MG/ML IV SOLN
INTRAVENOUS | Status: AC
  Filled 2014-07-15: qty 2

## 2014-07-15 MED ORDER — MIDAZOLAM HCL 2 MG/2ML IJ SOLN
INTRAMUSCULAR | Status: AC
  Filled 2014-07-15: qty 2

## 2014-07-15 MED ORDER — ACETAMINOPHEN 325 MG PO TABS: 650.0000 mg | ORAL_TABLET | ORAL | Status: DC | PRN

## 2014-07-15 MED ORDER — FENTANYL CITRATE 0.05 MG/ML IJ SOLN
INTRAMUSCULAR | Status: AC
  Filled 2014-07-15: qty 2

## 2014-07-15 MED ORDER — HEPARIN SODIUM (PORCINE) 1000 UNIT/ML IJ SOLN
INTRAMUSCULAR | Status: AC
  Filled 2014-07-15: qty 1

## 2014-07-15 MED ORDER — IPRATROPIUM-ALBUTEROL 0.5-2.5 (3) MG/3ML IN SOLN: 3.0000 mL | Freq: Four times a day (QID) | RESPIRATORY_TRACT | Status: DC | PRN

## 2014-07-15 MED ORDER — ONDANSETRON HCL 4 MG/2ML IJ SOLN: 4.0000 mg | Freq: Four times a day (QID) | INTRAMUSCULAR | Status: DC | PRN

## 2014-07-15 MED ORDER — SODIUM CHLORIDE 0.9 % IV SOLN
INTRAVENOUS | Status: DC
  Administered 2014-07-15 – 2014-07-16 (×2): via INTRAVENOUS

## 2014-07-15 MED ORDER — ASPIRIN EC 81 MG PO TBEC
81.0000 mg | DELAYED_RELEASE_TABLET | Freq: Every day | ORAL | Status: DC
  Administered 2014-07-16: 81 mg via ORAL
  Filled 2014-07-15: qty 1

## 2014-07-15 MED ORDER — NITROGLYCERIN 1 MG/10 ML FOR IR/CATH LAB
INTRA_ARTERIAL | Status: AC
  Filled 2014-07-15: qty 10

## 2014-07-15 MED ORDER — HEPARIN (PORCINE) IN NACL 2-0.9 UNIT/ML-% IJ SOLN
INTRAMUSCULAR | Status: AC
  Filled 2014-07-15: qty 1000

## 2014-07-15 NOTE — CV Procedure (Signed)
Johnchristian Pagni is a 64 y.o. male   [id number]  [id number] LOCATION:  FACILITY: [facility]  PHYSICIAN: Lennette Bihari, MD, [facility] [birth year]   DATE OF PROCEDURE:  07/15/2014     CARDIAC CATHETERIZATION    HISTORY:   Mr. 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  The patient has a prior tobacco history.  He recently  developed recurrent episodes of chest pain which can occur at rest but has occurred with exertion.  Because of his COPD he does not exercise regularly.  He underwent a nuclear perfusion study at the [facility], which was interpreted as abnormal and revealed a large perfusion defect in the inferior inferoseptal segments.  He was admitted to [facility] hospital yesterday.  He is referred for definitive cardiac catheterization.     PROCEDURE:  Left heart catheterization via the radial approach: Coronary angiography, left ventriculography.  The patient was brought to the second floor [facility] cath lab in the fasting state. The patient was premedicated with Versed 2 mg and fentanyl 50 mcg. A right radial approach was utilized after an Allen's test verified adequate circulation. The right radial artery was punctured via the Seldinger technique, and a 6 [place] Glidesheath Slender was inserted without difficulty.  A radial cocktail consisting of Verapamil, IV nitroglycerin, and lidocaine was administered. Weight adjusted heparin was administered. A safety J wire was advanced into the ascending aorta. Diagnostic catheterization was done with a 5 [place] TIG 4.0 catheter. A 5 French pigtail catheter was used for left ventriculography. A TR radial band was applied for hemostasis. The patient left the catheterization laboratory in stable condition.   HEMODYNAMICS:   Central Aorta: 104/75  Left Ventricle: 104/12  ANGIOGRAPHY:   The left main coronary artery was a  large angiographically normal vessel and bifurcated into the LAD and left circumflex coronary artery.   The LAD was angiographically normal and gave rise to 2 major diagonal vessels and several septal perforating arteries. The vessel extended to and wrapped around the LV apex.   The left circumflex coronary artery was an angiographically normal dominant vessel and gave rise to two major obtuse marginal branches and ended in the PDA and PLA vessel.  The RCA was angiographically normal nondominant vessel.  Left ventriculography revealed normal global LV contractility without focal segmental wall motion abnormalities. There was no evidence for mitral regurgitation.  Ejection fraction is 60%.    Total contrast used: 70 cc Omnipaque  IMPRESSION:  Normal LV function.  Normal coronary arteries.   Lennette Bihari, MD, [facility] 07/15/2014 8:23 AM

## 2014-07-15 NOTE — Progress Notes (Signed)
Received order however noted pt cath'd clean. Will not follow. Ethelda Chick CES, ACSM 3:26 PM 07/15/2014

## 2014-07-15 NOTE — Progress Notes (Signed)
Inventory done on pts wallet to document valuables.  This was done in front of pt with 2nd RN witness.  Belongings walked to securtiy by me.  Yellow copy placed in chart.

## 2014-07-15 NOTE — Progress Notes (Signed)
TR BAND REMOVAL  LOCATION:    right radial  DEFLATED PER PROTOCOL:    Yes.    TIME BAND OFF / DRESSING APPLIED:   1220   SITE UPON ARRIVAL:    Level 0  SITE AFTER BAND REMOVAL:    Level 0  CIRCULATION SENSATION AND MOVEMENT:    Within Normal Limits   Yes.    COMMENTS:   TRB REMOVED/ TEGADERM DSG APPLIED

## 2014-07-15 NOTE — Progress Notes (Signed)
BLEEDING FROM RT RADIAL AREA. 7cc OF AIR REPLACED IN BAND TO ACHIEVE HEMOSTASIS.

## 2014-07-15 NOTE — Progress Notes (Signed)
The patient was sent to Short Stay after cath, however, once there he had gross hematuria which is new.  He denies dysuria abd or flank pain.  Will transfer to tele floor.  Check UA.  May be related to IV heparin which was discontinued.  Monitor Hgb.  If it clears up, follow up with urology.   Annelisa Ryback, PAC

## 2014-07-15 NOTE — Interval H&P Note (Signed)
Cath Lab Visit (complete for each Cath Lab visit)  Clinical Evaluation Leading to the Procedure:   ACS: No.  Non-ACS:    Anginal Classification: CCS IV  Anti-ischemic medical therapy: Minimal Therapy (1 class of medications)  Non-Invasive Test Results: High-risk stress test findings: cardiac mortality >3%/year  Prior CABG: No previous CABG      History and Physical Interval Note:  07/15/2014 7:45 AM  Gerald Williams  has presented today for surgery, with the diagnosis of unstable angina  The various methods of treatment have been discussed with the patient and family. After consideration of risks, benefits and other options for treatment, the patient has consented to  Procedure(s): LEFT HEART CATHETERIZATION WITH CORONARY ANGIOGRAM (N/A) as a surgical intervention .  The patient's history has been reviewed, patient examined, no change in status, stable for surgery.  I have reviewed the patient's chart and labs.  Questions were answered to the patient's satisfaction.     Mickey Esguerra A

## 2014-07-15 NOTE — Progress Notes (Deleted)
Security called concerning pts personal cell phone.  Pt saying she has lost it.  Staff RNs and NTs have looked all over the room for phone and it is not there.  Laundry called and notified us that if phone comes up they will call us.

## 2014-07-15 NOTE — Progress Notes (Signed)
Pt voided in urinal and urine is yellow with no blood.  Will continue to monitor.

## 2014-07-15 NOTE — Progress Notes (Signed)
Patient urinating frank red blood. Dr. Tresa Endo notified/ Darlyne Russian, PA notified.

## 2014-07-16 ENCOUNTER — Encounter (HOSPITAL_COMMUNITY): Payer: Self-pay | Admitting: Cardiovascular Disease

## 2014-07-16 DIAGNOSIS — I200 Unstable angina: Secondary | ICD-10-CM | POA: Diagnosis not present

## 2014-07-16 LAB — GLUCOSE, CAPILLARY
GLUCOSE-CAPILLARY: 95 mg/dL (ref 70–99)
Glucose-Capillary: 87 mg/dL (ref 70–99)

## 2014-07-16 LAB — CBC
HCT: 37.2 % — ABNORMAL LOW (ref 39.0–52.0)
Hemoglobin: 12.4 g/dL — ABNORMAL LOW (ref 13.0–17.0)
MCH: 29.7 pg (ref 26.0–34.0)
MCHC: 33.3 g/dL (ref 30.0–36.0)
MCV: 89.2 fL (ref 78.0–100.0)
Platelets: 175 10*3/uL (ref 150–400)
RBC: 4.17 MIL/uL — AB (ref 4.22–5.81)
RDW: 13.9 % (ref 11.5–15.5)
WBC: 4.5 10*3/uL (ref 4.0–10.5)

## 2014-07-16 LAB — TSH: TSH: 1.667 u[IU]/mL (ref 0.350–4.500)

## 2014-07-16 NOTE — Progress Notes (Signed)
DC IV, DC Tele, DC Home. Discharge instructions and home medications discussed with patient. Patient denied any questions or concerns at this time. Patient leaving unit via wheelchair and appears in no acute distress.  

## 2014-07-16 NOTE — Discharge Summary (Signed)
Discharge Summary   Patient ID: Gerald Williams,  MRN: [id number], DOB/AGE: December 31, 1950 64 y.o.  Admit date: 07/14/2014 Discharge date: 07/16/2014  Primary Care Provider: [facility] Primary Cardiologist: New   Discharge Diagnoses Principal Problem:   Pain in the chest Active Problems:   History of tobacco abuse:  quit in ~ 06/2013    OSA on CPAP   Pre-diabetes: last A1C 6.2 (07/01/14)   Claudication, R>L   Hematuria   Allergies No Known Allergies  Procedures  Cardiac catheterization 07/15/2014  Left ventriculography revealed normal global LV contractility without focal segmental wall motion abnormalities. There was no evidence for mitral regurgitation. Ejection fraction is 60%.   IMPRESSION:  Normal LV function.  Normal coronary arteries.    Hospital Course  The patient is a 64 year old African-American male who is retired from [employer]. He has been followed by [facility] Past medical history include remote cocaine abuse and tobacco abuse, EtOH abuse, COPD, obstructive sleep apnea, prediabetes and hyperlipidemia. He has significant family history of CAD. He reportedly has been seen by [facility] and had a nuclear stress test on 07/01/2014 which was read as abnormal with large mild myocardial perfusion defect in the inferior and inferoseptal segment, EF 56%. He was sent home on sublingual nitroglycerin which she has been using multiple times daily. His chest discomfort came back on 07/14/2014 prompting the patient to seek medical attention at [facility] He was recently started on carvedilol and Lipitor. According to the patient, his chest discomfort was worse with exertion and relieved with morphine. Given the recent abnormal nuclear stress test, various treatment option has been discussed with the patient, it was eventually decided for the patient to undergo cardiac catheterization.  He was kept in observation overnight, serial troponin was negative. He  underwent the planned cardiac catheterization on 07/13/2014 which showed EF 60%, normal coronary arteries. Post cath, patient did have some hematuria which was eventually resolved by the morning of following day. It was felt that hematuria is likely related to IV heparin use which has been discontinued after cath. He was seen in the morning of 3/23, at which time he denies any significant chest discomfort for the last 24 hours or shortness of breath. He is deemed stable for discharge from cardiology perspective. Given the recent hematuria, he will need to follow-up with [facility] and urology. He will need outpatient evaluation for right calf claudication and pulse deficit by [facility] hospital. Given the lack of coronary artery disease, we will discontinue his Coreg as patient has significant bradycardia at baseline. He will continue on aspirin unless has recurrent hematuria.   Discharge Vitals Blood pressure 122/77, pulse 55, temperature 98.1 F (36.7 C), temperature source Oral, resp. rate 16, height 5\' 5"  (1.651 m), weight 168 lb 3.2 oz (76.295 kg), SpO2 95 %.  Filed Weights   07/14/14 2000 07/15/14 1452 07/16/14 0655  Weight: 169 lb 1.5 oz (76.7 kg) 167 lb 9.6 oz (76.023 kg) 168 lb 3.2 oz (76.295 kg)    Labs  CBC  Recent Labs  07/15/14 1958 07/16/14 0429  WBC 4.8 4.5  HGB 11.5* 12.4*  HCT 33.9* 37.2*  MCV 88.7 89.2  PLT 168 175   Basic Metabolic Panel  Recent Labs  07/14/14 1015  NA 143  K 3.8  CL 108  CO2 27  GLUCOSE 106*  BUN 15  CREATININE 1.34  CALCIUM 8.8   Cardiac Enzymes  Recent Labs  07/14/14 1936 07/15/14 0056  TROPONINI <0.03 <0.03  Disposition  Pt is being discharged home today in good condition.  Follow-up Plans & Appointments      Follow-up Information    Please follow up.   Why:  [facility]aud[facility] and pulse deficit to rule out PVD is the leg      Please follow up.   Why:  [facility] for hematuria      Follow up with Thurmon Fair, MD.   Specialty:  Cardiology   Why:  Please call as needed, otherwise should followup with your primary care doctor   Contact information:   [address] [place] [address] [phone]       Discharge Medications    Medication List    STOP taking these medications        carvedilol 6.25 MG tablet  Commonly known as:  COREG     cyclobenzaprine 10 MG tablet  Commonly known as:  FLEXERIL     HYDROcodone-acetaminophen 5-325 MG per tablet  Commonly known as:  NORCO/VICODIN     oxyCODONE-acetaminophen 5-325 MG per tablet  Commonly known as:  PERCOCET/ROXICET      TAKE these medications        albuterol (2.5 MG/3ML) 0.083% nebulizer solution  Commonly known as:  PROVENTIL  Take 2.5 mg by nebulization every 4 (four) hours as needed for wheezing or shortness of breath.     albuterol 108 (90 BASE) MCG/ACT inhaler  Commonly known as:  PROVENTIL HFA;VENTOLIN HFA  Inhale 2 puffs into the lungs every 6 (six) hours as needed for shortness of breath.     Ipratropium-Albuterol 20-100 MCG/ACT Aers respimat  Commonly known as:  COMBIVENT  Inhale 1 puff into the lungs every 6 (six) hours.     albuterol-ipratropium 18-103 MCG/ACT inhaler  Commonly known as:  COMBIVENT  Inhale 1 puff into the lungs every 6 (six) hours as needed for wheezing.     aspirin 81 MG tablet  Take 81 mg by mouth daily.     atorvastatin 80 MG tablet  Commonly known as:  LIPITOR  Take 80 mg by mouth at bedtime.     budesonide-formoterol 160-4.5 MCG/ACT inhaler  Commonly known as:  SYMBICORT  Inhale 2 puffs into the lungs daily as needed. For shortness of breath     ibuprofen 200 MG tablet  Commonly known as:  ADVIL,MOTRIN  Take 800 mg by mouth every 8 (eight) hours as needed for headache or moderate pain.     mirtazapine 15 MG tablet  Commonly known as:  REMERON  Take 15 mg by mouth at bedtime.      nitroGLYCERIN 0.4 MG SL tablet  Commonly known as:  NITROSTAT  Place 0.4 mg under the tongue every 5 (five) minutes as needed for chest pain.     OXYGEN  Inhale 2 L/min into the lungs continuous.        Outstanding Labs/Studies  Outpatient evaluation for R sided calf claudication and pulse deficit  Duration of Discharge Encounter   Greater than 30 minutes including physician time.  Ramond Dial PA-C Pager: [phone] 07/16/2014, 12:58 PM

## 2014-07-16 NOTE — Progress Notes (Addendum)
Patient Name: Gerald Williams Date of Encounter: 07/16/2014     Principal Problem:   Unstable angina Active Problems:   History of tobacco abuse:  quit in ~ 06/2013    OSA on CPAP   Pre-diabetes: last A1C 6.2 (07/01/14)   Claudication, R>L   Pain in the chest   Hematuria    SUBJECTIVE  Denies any CP or SOB. Last episode of CP was the day before yesterday.  CURRENT MEDS . aspirin EC  81 mg Oral Daily  . atorvastatin  80 mg Oral q1800  . budesonide-formoterol  2 puff Inhalation BID  . carvedilol  6.25 mg Oral BID WC  . insulin aspart  0-9 Units Subcutaneous TID WC    OBJECTIVE  Filed Vitals:   07/16/14 0236 07/16/14 0655 07/16/14 0852 07/16/14 1013  BP: 126/70 107/77  122/77  Pulse: 62 58  55  Temp: 97.2 F (36.2 C) 98.1 F (36.7 C)    TempSrc: Oral Oral    Resp: 18 16    Height:      Weight:  168 lb 3.2 oz (76.295 kg)    SpO2: 96% 95% 95%     Intake/Output Summary (Last 24 hours) at 07/16/14 1031 Last data filed at 07/16/14 1014  Gross per 24 hour  Intake 2733.34 ml  Output   1430 ml  Net 1303.34 ml   Filed Weights   07/14/14 2000 07/15/14 1452 07/16/14 0655  Weight: 169 lb 1.5 oz (76.7 kg) 167 lb 9.6 oz (76.023 kg) 168 lb 3.2 oz (76.295 kg)    PHYSICAL EXAM  General: Pleasant, NAD. Neuro: Alert and oriented X 3. Moves all extremities spontaneously. Psych: Normal affect. HEENT:  Normal  Neck: Supple without bruits or JVD. Lungs:  Resp regular and unlabored, CTA. Heart: RRR no s3, s4, or murmurs. R radial cath site stable.  Abdomen: Soft, non-tender, non-distended, BS + x 4.  Extremities: No clubbing, cyanosis or edema. DP/PT/Radials 2+ and equal bilaterally.  Accessory Clinical Findings  CBC  Recent Labs  07/15/14 1958 07/16/14 0429  WBC 4.8 4.5  HGB 11.5* 12.4*  HCT 33.9* 37.2*  MCV 88.7 89.2  PLT 168 175   Basic Metabolic Panel  Recent Labs  07/14/14 1015  NA 143  K 3.8  CL 108  CO2 27  GLUCOSE 106*  BUN 15  CREATININE 1.34    CALCIUM 8.8   Cardiac Enzymes  Recent Labs  07/14/14 1936 07/15/14 0056  TROPONINI <0.03 <0.03    TELE Sinus brady with HR 40-50s    ECG  No new EKG   Radiology/Studies  Dg Chest 2 View  07/14/2014   CLINICAL DATA:  Left posterior chest pain. Cough and congestion. History of emphysema. History of smoking.  EXAM: CHEST  2 VIEW  COMPARISON:  04/11/2014; 01/06/2013; 08/24/2011  FINDINGS: Grossly unchanged cardiac silhouette and mediastinal contours. The lungs are hyperexpanded with flattening of the bilateral hemidiaphragms and mild diffuse slightly nodular thickening of the pulmonary interstitium. Linear heterogeneous opacities radiating from the right hilum are unchanged and favored to represent atelectasis or scar. No new focal airspace opacities. No pleural effusion pneumothorax. No evidence of edema. No acute osseus abnormalities.  IMPRESSION: Hyperexpanded lungs without acute cardiopulmonary disease.   Electronically Signed   By: Simonne Come M.D.   On: 07/14/2014 10:28    ASSESSMENT AND PLAN  1. Chest pain  - abnormal myoview 07/01/2014 at [facility], large perfusion defect in the inferior and inferoseptal segment, EF 56%  -  cath 07/15/2014 normal coronaries, EF 60%  - stable for discharge today. Continue ASA unless has recurrent hematuria. D/C statin, last lipid panel in 2012 was normal. He wish to be on as few medication as possible.  2. Hematuria  - post cath had hematuria, cleared up by 3 AM this morning per pt, per nurse tech, urine yellow this morning without blood. Likely related to IV heparin which has since been discontinued.   - outpatient followup with PCP and possibly urology for hematuria esp if recur  3. Bradycardia  - severely bradycardic in 40s-50s this morning. Coreg held. SBP 99-120s  - given lack of CAD, will d/c coreg and followup with BP as outpatient.  4. HLD 5. OSA 6. COPD 7. Pre-diabetes 8. Former smoker  Medical illustrator, Azalee Course PA-C Pager: [phone]  I  have seen and examined the patient along with Azalee Course PA-C.  I have reviewed the chart, notes and new data.  I agree with PA's note.  Key new complaints: hematuria resolved Key examination changes: no signs CHF Key new findings / data: sinus bradycardia  PLAN: [facility] home. [facility]. Outpatient evaluation for right calf claudication and pulse deficit.  Thurmon Fair, MD, [facility] [phone] 07/16/2014, 12:15 PM

## 2014-07-16 NOTE — Discharge Instructions (Signed)
Radial Site Care Refer to this sheet in the next few weeks. These instructions provide you with information on caring for yourself after your procedure. Your caregiver may also give you more specific instructions. Your treatment has been planned according to current medical practices, but problems sometimes occur. Call your caregiver if you have any problems or questions after your procedure. HOME CARE INSTRUCTIONS  You may shower the day after the procedure.Remove the bandage (dressing) and gently wash the site with plain soap and water.Gently pat the site dry.  Do not apply powder or lotion to the site.  Do not submerge the affected site in water for 3 to 5 days.  Inspect the site at least twice daily.  Do not flex or bend the affected arm for 24 hours.  No lifting over 5 pounds (2.3 kg) for 5 days after your procedure.  Do not drive home if you are discharged the same day of the procedure. Have someone else drive you.  You may drive 24 hours after the procedure unless otherwise instructed by your caregiver.  Do not operate machinery or power tools for 24 hours.  A responsible adult should be with you for the first 24 hours after you arrive home. What to expect:  Any bruising will usually fade within 1 to 2 weeks.  Blood that collects in the tissue (hematoma) may be painful to the touch. It should usually decrease in size and tenderness within 1 to 2 weeks. SEEK IMMEDIATE MEDICAL CARE IF:  You have unusual pain at the radial site.  You have redness, warmth, swelling, or pain at the radial site.  You have drainage (other than a small amount of blood on the dressing).  You have chills.  You have a fever or persistent symptoms for more than 72 hours.  You have a fever and your symptoms suddenly get worse.  Your arm becomes pale, cool, tingly, or numb.  You have heavy bleeding from the site. Hold pressure on the site. Document Released: 05/14/2010 Document Revised:  07/04/2011 Document Reviewed: 05/14/2010 [facility] Patient Information 2015 [place], [place]. This information is not intended to replace advice given to you by your health care provider. Make sure you discuss any questions you have with your health care provider.  No driving for 24 hours. No lifting over 5 lbs for 1 week. No sexual activity for 1 week. Keep procedure site clean & dry. If you notice increased pain, swelling, bleeding or pus, call/return!  You may shower, but no soaking baths/hot tubs/pools for 1 week.

## 2015-03-13 ENCOUNTER — Emergency Department (HOSPITAL_COMMUNITY): Payer: Medicare Other

## 2015-03-13 ENCOUNTER — Encounter (HOSPITAL_COMMUNITY): Payer: Self-pay

## 2015-03-13 ENCOUNTER — Emergency Department (HOSPITAL_COMMUNITY)
Admission: EM | Admit: 2015-03-13 | Discharge: 2015-03-13 | Disposition: A | Discharge status: Home / Self Care | Payer: Medicare Other | Attending: Emergency Medicine | Admitting: Emergency Medicine

## 2015-03-13 DIAGNOSIS — J439 Emphysema, unspecified: Secondary | ICD-10-CM | POA: Diagnosis not present

## 2015-03-13 DIAGNOSIS — R071 Chest pain on breathing: Secondary | ICD-10-CM | POA: Insufficient documentation

## 2015-03-13 DIAGNOSIS — Z87891 Personal history of nicotine dependence: Secondary | ICD-10-CM | POA: Insufficient documentation

## 2015-03-13 DIAGNOSIS — N289 Disorder of kidney and ureter, unspecified: Secondary | ICD-10-CM | POA: Diagnosis not present

## 2015-03-13 DIAGNOSIS — E785 Hyperlipidemia, unspecified: Secondary | ICD-10-CM | POA: Diagnosis not present

## 2015-03-13 DIAGNOSIS — Z7982 Long term (current) use of aspirin: Secondary | ICD-10-CM | POA: Diagnosis not present

## 2015-03-13 DIAGNOSIS — Z79899 Other long term (current) drug therapy: Secondary | ICD-10-CM | POA: Insufficient documentation

## 2015-03-13 DIAGNOSIS — R079 Chest pain, unspecified: Secondary | ICD-10-CM | POA: Diagnosis present

## 2015-03-13 DIAGNOSIS — Z8669 Personal history of other diseases of the nervous system and sense organs: Secondary | ICD-10-CM | POA: Diagnosis not present

## 2015-03-13 LAB — I-STAT CHEM 8, ED
BUN: 22 mg/dL — AB (ref 6–20)
CHLORIDE: 105 mmol/L (ref 101–111)
Calcium, Ion: 1.24 mmol/L (ref 1.13–1.30)
Creatinine, Ser: 1.4 mg/dL — ABNORMAL HIGH (ref 0.61–1.24)
Glucose, Bld: 105 mg/dL — ABNORMAL HIGH (ref 65–99)
HEMATOCRIT: 44 % (ref 39.0–52.0)
Hemoglobin: 15 g/dL (ref 13.0–17.0)
Potassium: 4.1 mmol/L (ref 3.5–5.1)
Sodium: 143 mmol/L (ref 135–145)
TCO2: 26 mmol/L (ref 0–100)

## 2015-03-13 LAB — CBC WITH DIFFERENTIAL/PLATELET
BASOS ABS: 0.2 10*3/uL — AB (ref 0.0–0.1)
Basophils Relative: 3 %
Eosinophils Absolute: 0.2 10*3/uL (ref 0.0–0.7)
Eosinophils Relative: 4 %
HEMATOCRIT: 40.5 % (ref 39.0–52.0)
HEMOGLOBIN: 13.9 g/dL (ref 13.0–17.0)
LYMPHS PCT: 42 %
Lymphs Abs: 2.2 10*3/uL (ref 0.7–4.0)
MCH: 30.9 pg (ref 26.0–34.0)
MCHC: 34.3 g/dL (ref 30.0–36.0)
MCV: 90 fL (ref 78.0–100.0)
MONOS PCT: 6 %
Monocytes Absolute: 0.3 10*3/uL (ref 0.1–1.0)
Neutro Abs: 2.3 10*3/uL (ref 1.7–7.7)
Neutrophils Relative %: 45 %
Platelets: 197 10*3/uL (ref 150–400)
RBC: 4.5 MIL/uL (ref 4.22–5.81)
RDW: 14.2 % (ref 11.5–15.5)
WBC: 5.2 10*3/uL (ref 4.0–10.5)

## 2015-03-13 LAB — I-STAT TROPONIN, ED
Troponin i, poc: 0 ng/mL (ref 0.00–0.08)
Troponin i, poc: 0 ng/mL (ref 0.00–0.08)

## 2015-03-13 LAB — D-DIMER, QUANTITATIVE (NOT AT ARMC): D DIMER QUANT: 0.53 ug{FEU}/mL — AB (ref 0.00–0.50)

## 2015-03-13 MED ORDER — SODIUM CHLORIDE 0.9 % IV BOLUS (SEPSIS)
1000.0000 mL | Freq: Once | INTRAVENOUS | Status: AC
  Administered 2015-03-13: 1000 mL via INTRAVENOUS

## 2015-03-13 MED ORDER — IOHEXOL 350 MG/ML SOLN
100.0000 mL | Freq: Once | INTRAVENOUS | Status: AC | PRN
  Administered 2015-03-13: 75 mL via INTRAVENOUS

## 2015-03-13 MED ORDER — MORPHINE SULFATE (PF) 4 MG/ML IV SOLN
4.0000 mg | Freq: Once | INTRAVENOUS | Status: AC
  Administered 2015-03-13: 4 mg via INTRAVENOUS
  Filled 2015-03-13: qty 1

## 2015-03-13 NOTE — ED Notes (Addendum)
Pt. Having chest pain  Under lt. Breast, constant dull pain.  No pt. Does makes it worse or better.  Denies any sob or n/v.   Pt. Has a hx of COPD.  Pt. Denies any period of seats.  Taking a breath increases the pain,  Denies any URI symptoms.  Constant  Bilateral leg pain

## 2015-03-13 NOTE — Discharge Instructions (Signed)
Please follow up with your doctor and your cardiologist for further evaluation of your chest pain.  No evidence of blood clot in your lung or lung infection.  Your kidney function is not normal and will need to be recheck by your doctor.  Return to ER if your condition worsen or if you have other concerns.  Chest Pain Observation It is often hard to give a specific diagnosis for the cause of chest pain. Among other possibilities your symptoms might be caused by inadequate oxygen delivery to your heart (angina). Angina that is not treated or evaluated can lead to a heart attack (myocardial infarction) or death. Blood tests, electrocardiograms, and X-rays may have been done to help determine a possible cause of your chest pain. After evaluation and observation, your health care provider has determined that it is unlikely your pain was caused by an unstable condition that requires hospitalization. However, a full evaluation of your pain may need to be completed, with additional diagnostic testing as directed. It is very important to keep your follow-up appointments. Not keeping your follow-up appointments could result in permanent heart damage, disability, or death. If there is any problem keeping your follow-up appointments, you must call your health care provider. HOME CARE INSTRUCTIONS  Due to the slight chance that your pain could be angina, it is important to follow your health care provider's treatment plan and also maintain a healthy lifestyle:  Maintain or work toward achieving a healthy weight.  Stay physically active and exercise regularly.  Decrease your salt intake.  Eat a balanced, healthy diet. Talk to a dietitian to learn about heart-healthy foods.  Increase your fiber intake by including whole grains, vegetables, fruits, and nuts in your diet.  Avoid situations that cause stress, anger, or depression.  Take medicines as advised by your health care provider. Report any side effects to  your health care provider. Do not stop medicines or adjust the dosages on your own.  Quit smoking. Do not use nicotine patches or gum until you check with your health care provider.  Keep your blood pressure, blood sugar, and cholesterol levels within normal limits.  Limit alcohol intake to no more than 1 drink per day for women who are not pregnant and 2 drinks per day for men.  Do not abuse drugs. SEEK IMMEDIATE MEDICAL CARE IF: You have severe chest pain or pressure which may include symptoms such as:  You feel pain or pressure in your arms, neck, jaw, or back.  You have severe back or abdominal pain, feel sick to your stomach (nauseous), or throw up (vomit).  You are sweating profusely.  You are having a fast or irregular heartbeat.  You feel short of breath while at rest.  You notice increasing shortness of breath during rest, sleep, or with activity.  You have chest pain that does not get better after rest or after taking your usual medicine.  You wake from sleep with chest pain.  You are unable to sleep because you cannot breathe.  You develop a frequent cough or you are coughing up blood.  You feel dizzy, faint, or experience extreme fatigue.  You develop severe weakness, dizziness, fainting, or chills. Any of these symptoms may represent a serious problem that is an emergency. Do not wait to see if the symptoms will go away. Call your local emergency services (911 in the U.S.). Do not drive yourself to the hospital. MAKE SURE YOU:  Understand these instructions.  Will watch your condition.  Will get help right away if you are not doing well or get worse.   This information is not intended to replace advice given to you by your health care provider. Make sure you discuss any questions you have with your health care provider.   Document Released: 05/14/2010 Document Revised: 04/16/2013 Document Reviewed: 10/11/2012 Elsevier Interactive Patient Education [employer].   Emergency Department Resource Guide 1) Find a Doctor and Pay Out of Pocket Although you won't have to find out who is covered by your insurance plan, it is a good idea to ask around and get recommendations. You will then need to call the office and see if the doctor you have chosen will accept you as a new patient and what types of options they offer for patients who are self-pay. Some doctors offer discounts or will set up payment plans for their patients who do not have insurance, but you will need to ask so you aren't surprised when you get to your appointment.  2) Contact Your Local Health Department Not all health departments have doctors that can see patients for sick visits, but many do, so it is worth a call to see if yours does. If you don't know where your local health department is, you can check in your phone book. The CDC also has a tool to help you locate your state's health department, and many state websites also have listings of all of their local health departments.  3) Find a Walk-in Clinic If your illness is not likely to be very severe or complicated, you may want to try a walk in clinic. These are popping up all over the country in pharmacies, drugstores, and shopping centers. They're usually staffed by nurse practitioners or physician assistants that have been trained to treat common illnesses and complaints. They're usually fairly quick and inexpensive. However, if you have serious medical issues or chronic medical problems, these are probably not your best option.  No Primary Care Doctor: - Call Health Connect at  [phone] - they can help you locate a primary care doctor that  accepts your insurance, provides certain services, etc. - Physician Referral Service- [phone]  Chronic Pain Problems: Organization         Address  Phone   Notes  [facility]  [phone] Patients need to be referred by their primary care doctor.    Medication Assistance: Organization         Address  Phone   Notes  [facility] Medication [facility] [address] [place] [place], [place] [address] [phone] --Must be a resident of [facility] -- Must have NO insurance coverage whatsoever (no Medicaid/ Medicare, etc.) -- The pt. MUST have a primary care doctor that directs their care regularly and follows them in the community   Med[facility]ist  [phone]   [employer]  [phone]    Agencies that provide inexpensive medical care: Organization         Address  Phone   Notes  [facility]  [phone]   [facility]    [phone]   [facility] [address] [place], [place] [address] [phone]   [facility] [address] [place] [place] [phone]   Planned Parenthood    [phone]   [facility]    [phone]   [facility]  [address] [place],  [place] Phone:  [phone], Fax:  [phone] Hours of Operation:  9 am - 6 pm, M-F.  Also accepts Medicaid/Medicare and self-pay.  [facility]  [address], [place] Phone: [phone], Fax: [phone]. Hours of Operation:  8:30 am - 5:30 pm, M-F.  Also accepts Medicaid and self-pay.  [facility] [place] [address], [place] Phone: [phone]   [facility] [address] Natasha Bence [place], [place] [phone], Ext. [phone] Mondays & Thursdays: 7-9 AM.  First 15 patients are seen on a first come, first serve basis.    Medicaid-accepting [facility] Providers:  Organization         Address  Phone   Notes  [facility] [address], [place] [phone] Also accepts self-pay patients.  [facility] [address] Laurell Josephs [place], [place]  [phone]   [facility] [address], [place] [phone]   [facility] [address] [place] [phone]   Renaye Rakers [address], [place]   [phone] Only accepts [place] Access [place] patients after they have their name applied to their card.   Self-Pay (no insurance) in [facility]:  Organization         Address  Phone   Notes  Sickle Cell Patients, [facility] [address] [place], [place] [phone]   [facility] [address] [place], [place] [phone]   [facility] [place]  [address], [place] [phone]   [facility]/Dr. Osei-Bonsu  [address], [place] or [address], [place] [phone] Phone number for both [place] and [place] locations is the same.  [facility]gent Medical and [facility] [address] [place] [phone]   [facility] [address], [place] or [address] [phone]   [facility] [address], [place] [phone], phone; [phone], fax Sees patients 1st and 3rd Saturday of every month.  Must not qualify for public or private insurance (i.e. Medicaid, Medicare, [place] Health Choice, Veterans' Benefits)  Household income should be no more than 200% of the poverty level The clinic cannot treat you if you are pregnant or think you are pregnant  Sexually transmitted diseases are not treated at the clinic.    Dental Care: Organization         Address  Phone  Notes  [facility] Department of [facility] [address] [place], [place] [phone] Accepts children up to age 26 who are enrolled in [place] or [place] Health Choice; pregnant women with a Medicaid card; and children who have applied for Medicaid or [place] Health Choice, but were declined, whose parents can pay a reduced fee at time of service.    [facility] Department of [facility]  [address], [place] [phone] Accepts children up to age 53 who are enrolled in [place] or [place] Health Choice; pregnant women with a Medicaid card; and children who have applied for Medicaid or [place] Health Choice, but were declined, whose parents can pay a reduced fee at time of service.  [facility]  [address] [place], [place] [phone] Patients are seen  by appointment only. Walk-ins are not accepted. [facility] will see patients 63 years of age and older. Monday - Tuesday (8am-5pm) Most Wednesdays (8:30-5pm) $30 per visit, cash only  [facility]  [address], [facility] [phone] Patients are seen by appointment only. Walk-ins are not accepted. [facility] will see patients 63 years of age and older. One Wednesday Evening (Monthly: Volunteer Based).  $30 per visit, cash only  [employer] [facility] [facility]  [phone] for adults; Children under age 514, call Graduate Pediatric Dentistry at [phone]. Children aged 284-14, please call [phone] to request a pediatric application.  Dental services are provided in all areas of dental care including fillings, crowns and bridges, complete and partial dentures, implants, gum treatment, root canals, and extractions. Preventive care is also provided. Treatment is provided to both adults and children. Patients are selected via a lottery and there is often a waiting list.   [facility] [address], [place]  [phone] [email or web address]   [facility] [address], [place], [place] [phone], Ext. [phone] Second and Fourth Thursday of each month, opens at 6:30 AM; Clinic ends at 9 AM.  Patients are seen on a first-come first-served basis, and a limited number are seen during each clinic.   [facility]  [address], [place], [place] [phone]  [phone]   Eligibility Requirements You must have lived in [place], [place], or [place] counties for at least the last three months.   You cannot be eligible for state or federal sponsored [place]healthcare insurance, including CIGNAVeterans Administration, [place]icaid, or [employer]icare.   You generally cannot be eligible for healthcare insurance through your employer.    How to apply: Eligibility screenings are held every Tuesday and Wednesday afternoon from 1:00 pm until 4:00 pm. You do not need an appointment for the interview!  [facility] [address], [place], [place] [phone]   [facility]  [phone]   [facility]  [phone]   [facility]  [phone]    Behavioral Health Resources in the Community: Intensive Outpatient Programs Organization         Address  Phone  Notes  [facility] [address], [place], [place] [phone]   [facility] [address], [place], [place] [phone]   [facility] [address], [place], [place]  [phone]   [facility] [address],  [place], [place] [phone] or [phone]   Substance Abuse Resources Organization         Address  Phone  Notes  [facility]  [phone]   [facility]  [phone]   [facility]  [phone]   [facility]  [phone]   Residential & Outpatient Substance Abuse Program  [phone]   Psychological Services Organization         Address  Phone  Notes  [facility]  [phone]- [phone]   [facility]  [phone]- [phone]   [facility] [address], [place] [phone] or [phone]    Mobile Crisis Teams Organization         Address  Phone  Notes  [facility], Mobile Crisis Care Unit  [phone]   [facility]  [address] [place], [place] [phone]   [facility] [address] [place]  [phone]    Self-Help/Support Groups Organization         Address  Phone             Notes  [facility] [place] - variety of support groups  [phone]- I7437963 Call for more information  Narcotics Anonymous (NA), [facility] [address], [employer] [place]  2 meetings at this location   Statistician         Address  Phone  Notes  [facility] [address],    [place]  [phone]   [facility]  [address], [place] [address], [place], [place] [phone]   [facility] [address] [place], [place] [phone] Admissions: 8am-3pm M-F  [facility] [address],    [place], [place] [phone]   [facility] [address] [place], [place], [place] [phone]   [facility] [address]  [place], [place] [phone]   [facility] - Intensive Outpatient [address] Laurell Joseph[address], [place], [place] [phone]   [facility] ([facility] [address] [place].,  [place], [place] [phone] or [phone]   [facility] ([facility]) [address], [place], [place] [phone] Accepts Medicaid  [facility] [address]  [place] [phone] Substance Abuse/Addiction Treatment   [facility] Organization         Address  Phone  Notes  [facility]  [phone]   Angie Fava, PhD [address] Ervin Knack [place], [place]   [phone] or [phone]   [facility]   [address] [place], [place] [phone]   [facility] [address] [place] [address] [place], [place] [phone] Insurance/Medicaid/sponsorship through [facility] and Families [address]                                    [place], [place] [phone]  Therapy/tele-psych/case  [facility] [address], [place] [phone]    Dr. Lolly Mustache  [phone]   [facility]  [employer] [facility] Dept. 1) [address], [place] 2) [address], [place] 3)  [address], [place] [phone]  [phone]   [facility] Child Abuse Hotline [phone] or 8641523633 (After Hours)

## 2015-03-13 NOTE — ED Provider Notes (Signed)
CSN: [id number]     Arrival date & time 03/13/15  0700 History   First MD Initiated Contact with Patient 03/13/15 [phone]     No chief complaint on file.    (Consider location/radiation/quality/duration/timing/severity/associated sxs/prior Treatment) HPI   64 year old male with history of emphysema, and polysubstance abuse presenting for evaluation of chest pain.  Patient reports a constant left-sided chest pain which he described as a dull achy sensation with mild pleuritic component, nonradiating and has been persistent since 11 AM yesterday. Nothing seems to make pain better or worse. Denies any exertional chest pain denies lightheadedness dizziness diaphoresis fever chills or productive cough or increased work of breathing. Does have history of COPD and emphysema for which he takes medication for. Denies any specific treatment tried for his pain. Denies any runny nose sneezing sore throat cough if pain or other URI symptoms. No abdominal pain nausea vomiting or diarrhea. Eating does not bother him. He attempted to go to the [place] yesterday but the wait was long and decided to come to the ED today for further evaluation. No prior history of PE or DVT, no recent surgery, prolonged bed rest, active cancer, or hemoptysis. Reported history of constant bilateral leg pain which he attributes to neuropathy, this has not changed. Remote history of crack cocaine use but states that he has been clean for the past 2 years. Denies any alcohol abuse. He is a former smoker. He does have family history of cardiac disease. Per prior cardiology notes, patient had an abnormal Myoview 13/11/2014 which shows large perfusion defect in the inferior and inferior septal segment. He has a heart catheterization on 07/15/2014 which shows normal coronary arteries with an EF of 60%.      Past Medical History  Diagnosis Date  . Emphysema   . Polysubstance abuse     stopped using crack/cocaine December 4th 20-14  . Sleep apnea    . Hyperlipemia    Past Surgical History  Procedure Laterality Date  . Left heart catheterization with coronary angiogram N/A 07/15/2014    Procedure: LEFT HEART CATHETERIZATION WITH CORONARY ANGIOGRAM;  Surgeon: Lennette Bihari, MD;  Location: [facility] CATH LAB;  Service: Cardiovascular;  Laterality: N/A;   Family History  Problem Relation Age of Onset  . Diabetes Mother   . Heart disease Father   . Heart attack Father   . Cancer - Other Sister    Social History  Substance Use Topics  . Smoking status: Former Smoker -- 1.50 packs/day for 50 years    Types: Cigarettes    Start date: 04/26/1963    Quit date: 09/22/2013  . Smokeless tobacco: Never Used     Comment: patient using the patch, losenges, and medication  . Alcohol Use: No    Review of Systems  All other systems reviewed and are negative.     Allergies  Review of patient's allergies indicates no known allergies.  Home Medications   Prior to Admission medications   Medication Sig Start Date End Date Taking? Authorizing Provider  albuterol (PROVENTIL HFA;VENTOLIN HFA) 108 (90 BASE) MCG/ACT inhaler Inhale 2 puffs into the lungs every 6 (six) hours as needed for shortness of breath.    Historical Provider, MD  albuterol (PROVENTIL) (2.5 MG/3ML) 0.083% nebulizer solution Take 2.5 mg by nebulization every 4 (four) hours as needed for wheezing or shortness of breath.     Historical Provider, MD  albuterol-ipratropium (COMBIVENT) 18-103 MCG/ACT inhaler Inhale 1 puff into the lungs every 6 (six) hours as  needed for wheezing.     Historical Provider, MD  aspirin 81 MG tablet Take 81 mg by mouth daily.    Historical Provider, MD  atorvastatin (LIPITOR) 80 MG tablet Take 80 mg by mouth at bedtime.    Historical Provider, MD  budesonide-formoterol (SYMBICORT) 160-4.5 MCG/ACT inhaler Inhale 2 puffs into the lungs daily as needed. For shortness of breath    Historical Provider, MD  ibuprofen (ADVIL,MOTRIN) 200 MG tablet Take 800 mg by  mouth every 8 (eight) hours as needed for headache or moderate pain.    Historical Provider, MD  Ipratropium-Albuterol (COMBIVENT) 20-100 MCG/ACT AERS respimat Inhale 1 puff into the lungs every 6 (six) hours.    Historical Provider, MD  mirtazapine (REMERON) 15 MG tablet Take 15 mg by mouth at bedtime.    Historical Provider, MD  nitroGLYCERIN (NITROSTAT) 0.4 MG SL tablet Place 0.4 mg under the tongue every 5 (five) minutes as needed for chest pain.    Historical Provider, MD  OXYGEN Inhale 2 L/min into the lungs continuous.    Historical Provider, MD   There were no vitals taken for this visit. Physical Exam  Constitutional: He appears well-developed and well-nourished. No distress.  Awake, alert, nontoxic appearance  HENT:  Head: Atraumatic.  Eyes: Conjunctivae are normal. Right eye exhibits no discharge. Left eye exhibits no discharge.  Neck: Normal range of motion. Neck supple.  Cardiovascular: Normal rate, regular rhythm and intact distal pulses.   Pulmonary/Chest: Effort normal. No respiratory distress. He exhibits no tenderness.  Diminished lung sounds but no obvious wheezes, rales, rhonchi heard.  Abdominal: Soft. There is no tenderness. There is no rebound.  Musculoskeletal: He exhibits no edema or tenderness.  Neurological: He is alert.  Skin: Skin is warm and dry. No rash noted.  Psychiatric: He has a normal mood and affect.  Nursing note and vitals reviewed.   ED Course  Procedures (including critical care time)  Patient here with persistent left-sided chest pain. Atypical for ACS. He has a normal cath this year. He does have some pleuritic component and does not qualify for PERC criteria to rule out PE therefore d-dimer was obtained. His initial EKG troponin and chest x-ray is unremarkable.  D-dimer is mildly elevated. Patient does have mild evidence of renal insufficiency with creatinine 1.4. His previous GFR value has been >30.  Will obtain chest CTA for further  evaluation.  NS given.  Morphine given for pain.  Care discussed with Dr. Dalene Seltzer.    10:37 AM Chest CT anterior without evidence of PE or pneumonia. Mild atelectasis were noted. At this time, the patient is stable for discharge. He can follow-up with his primary care provider and his heart specialist for further management. He requesting for outpatient resource which I will be given. Return precaution discussed.  Labs Review Labs Reviewed  CBC WITH DIFFERENTIAL/PLATELET - Abnormal; Notable for the following:    Basophils Absolute 0.2 (*)    All other components within normal limits  D-DIMER, QUANTITATIVE (NOT AT [facility]) - Abnormal; Notable for the following:    D-Dimer, Quant 0.53 (*)    All other components within normal limits  I-STAT CHEM 8, ED - Abnormal; Notable for the following:    BUN 22 (*)    Creatinine, Ser 1.40 (*)    Glucose, Bld 105 (*)    All other components within normal limits  I-STAT TROPOININ, ED  Rosezena Sensor, ED    Imaging Review Dg Chest 2 View  03/13/2015  CLINICAL  DATA:  Left lower chest pain. History of emphysema. Shortness of breath. Initial encounter. EXAM: CHEST  2 VIEW COMPARISON:  PA and lateral chest 07/14/2014 and 04/11/2014. FINDINGS: The chest is hyperexpanded with attenuation of the pulmonary vasculature. Small focus of linear atelectasis or scar in the the lingula of this noted. The lungs are otherwise clear. Heart size is normal. No pneumothorax or pleural effusion. IMPRESSION: COPD without acute disease. Electronically Signed   By: Drusilla Kanner M.D.   On: 03/13/2015 07:50   Ct Angio Chest Pe W/cm &/or Wo Cm  03/13/2015  CLINICAL DATA:  Acute left-sided chest pain. Elevated D-dimer level. EXAM: CT ANGIOGRAPHY CHEST WITH CONTRAST TECHNIQUE: Multidetector CT imaging of the chest was performed using the standard protocol during bolus administration of intravenous contrast. Multiplanar CT image reconstructions and MIPs were obtained to evaluate  the vascular anatomy. CONTRAST:  82mL OMNIPAQUE IOHEXOL 350 MG/ML SOLN COMPARISON:  CT scan of June 20, 2003. FINDINGS: No pneumothorax or pleural effusion is noted. Mild posterior subsegmental atelectasis is noted in both upper lobes. There is no evidence of pulmonary embolus. There is no evidence of thoracic aortic aneurysm. No mediastinal mass or adenopathy is noted. Visualized portion of upper abdomen is unremarkable. No significant osseous abnormality is noted. Review of the MIP images confirms the above findings. IMPRESSION: No evidence of pulmonary embolus. Mild bilateral dependent subsegmental atelectasis is noted in the upper lobes. Electronically Signed   By: Lupita Raider, M.D.   On: 03/13/2015 09:55   I have personally reviewed and evaluated these images and lab results as part of my medical decision-making.   EKG Interpretation   Date/Time:  Friday March 13 2015 07:07:05 EST Ventricular Rate:  67 PR Interval:  127 QRS Duration: 89 QT Interval:  428 QTC Calculation: 452 R Axis:   40 Text Interpretation:  Sinus rhythm Abnormal R-wave progression, early  transition Nonspecific T wave abnormality No significant change since last  tracing Confirmed by [facility] MD, ERIN ([id number]) on 03/13/2015 7:10:57 AM      MDM   Final diagnoses:  Chest pain on breathing  Renal insufficiency    BP 147/97 mmHg  Pulse 62  Temp(Src) 97.9 F (36.6 C) (Oral)  Resp 17  Ht 5\' 5"  (1.651 m)  Wt 174 lb (78.926 kg)  BMI 28.96 kg/m2  SpO2 97%     Fayrene Helper, PA-C 03/13/15 1043  Alvira Monday, MD 03/15/15 1928

## 2015-05-14 ENCOUNTER — Ambulatory Visit: Payer: Medicare Other | Admitting: Internal Medicine

## 2015-05-20 ENCOUNTER — Encounter: Payer: Self-pay | Admitting: Internal Medicine

## 2015-05-20 ENCOUNTER — Ambulatory Visit (INDEPENDENT_AMBULATORY_CARE_PROVIDER_SITE_OTHER): Payer: Medicare Other | Admitting: Internal Medicine

## 2015-05-20 VITALS — BP 134/90 | HR 82 | Temp 98.2°F | Resp 18 | Ht 65.5 in | Wt 174.0 lb

## 2015-05-20 DIAGNOSIS — J441 Chronic obstructive pulmonary disease with (acute) exacerbation: Secondary | ICD-10-CM

## 2015-05-20 DIAGNOSIS — R079 Chest pain, unspecified: Secondary | ICD-10-CM | POA: Diagnosis not present

## 2015-05-20 DIAGNOSIS — J439 Emphysema, unspecified: Secondary | ICD-10-CM

## 2015-05-20 DIAGNOSIS — G4733 Obstructive sleep apnea (adult) (pediatric): Secondary | ICD-10-CM

## 2015-05-20 DIAGNOSIS — Z9989 Dependence on other enabling machines and devices: Secondary | ICD-10-CM

## 2015-05-20 DIAGNOSIS — G629 Polyneuropathy, unspecified: Secondary | ICD-10-CM | POA: Insufficient documentation

## 2015-05-20 DIAGNOSIS — G6289 Other specified polyneuropathies: Secondary | ICD-10-CM | POA: Diagnosis not present

## 2015-05-20 DIAGNOSIS — R0989 Other specified symptoms and signs involving the circulatory and respiratory systems: Secondary | ICD-10-CM

## 2015-05-20 DIAGNOSIS — R7303 Prediabetes: Secondary | ICD-10-CM

## 2015-05-20 DIAGNOSIS — F4310 Post-traumatic stress disorder, unspecified: Secondary | ICD-10-CM

## 2015-05-20 DIAGNOSIS — R350 Frequency of micturition: Secondary | ICD-10-CM

## 2015-05-20 MED ORDER — PREGABALIN 50 MG PO CAPS: 50.0000 mg | ORAL_CAPSULE | Freq: Three times a day (TID) | ORAL | Status: DC

## 2015-05-20 NOTE — Assessment & Plan Note (Signed)
Was on oxygen therapy, but was taken off because he did not desaturate with exertion He feels short of breath and feels that he needs oxygen We will refer to pulmonary for further evaluation

## 2015-05-20 NOTE — Assessment & Plan Note (Signed)
Using CPAP nightly 

## 2015-05-20 NOTE — Progress Notes (Signed)
Subjective:    Patient ID: Gerald Williams, male    DOB: Apr 12, 1951, 65 y.o.   MRN: [id number]  HPI He is here to establish with a new pcp.    Peripheral neuropathy:  His neuropathy is very painful. He takes gabapentin 100 mg nightly.  He has pain, numbness/tingling in both legs from his knees down.  He was told by the [facility] for his neuropathy is likely caused by Agent Orange.  COPD, OSA:  [facility] but [facility] because his oxygen sat was 95% with exertion, but he is very sob with walking short distances.  He uses the oxygen at home when needed - he uses the oxygen that is connected to his cpap.  Since he was taken off the oxygen he has had headaches.  He feels sob with walking and thinks he needs the oxygen outside the house.   Chest pain: He has daily left sided chest pain.  He thinks it is most likely will and wonders if it is related to needing the oxygen. He denies any relation to activity.  He denies palpitations, leg edema and lightheadedness.  He did have a cardiac catheterization March 2016, which showed normal LV function and normal coronary arteries.  Prediabetes:  He is not compliant with a low sugar/carb diet.  He is not exercising regularly.   Medications and allergies reviewed with patient and updated if appropriate.  Patient Active Problem List   Diagnosis Date Noted  . Peripheral neuropathy (HCC) 05/20/2015  . COPD (chronic obstructive pulmonary disease) with emphysema (HCC) 05/20/2015  . PTSD (post-traumatic stress disorder) 05/20/2015  . Right carotid bruit 05/20/2015  . Frequent urination 05/20/2015  . Hematuria 07/15/2014  . History of tobacco abuse:  quit in ~ 06/2013  07/14/2014  . OSA on CPAP 07/14/2014  . Pre-diabetes: last A1C 6.2 (07/01/14) 07/14/2014  . Claudication, R>L 07/14/2014  . Cocaine use, history of 03/26/2011  . Chest pain 03/26/2011    Current Outpatient Prescriptions on File Prior to Visit  Medication Sig Dispense Refill   . albuterol (PROVENTIL HFA;VENTOLIN HFA) 108 (90 BASE) MCG/ACT inhaler Inhale 2 puffs into the lungs every 6 (six) hours as needed for shortness of breath.    Marland Kitchen albuterol (PROVENTIL) (2.5 MG/3ML) 0.083% nebulizer solution Take 2.5 mg by nebulization every 4 (four) hours as needed for wheezing or shortness of breath.     Marland Kitchen albuterol-ipratropium (COMBIVENT) 18-103 MCG/ACT inhaler Inhale 1 puff into the lungs every 6 (six) hours as needed for wheezing.     Marland Kitchen aspirin 81 MG tablet Take 81 mg by mouth daily.    Marland Kitchen atorvastatin (LIPITOR) 80 MG tablet Take 80 mg by mouth at bedtime.    . budesonide-formoterol (SYMBICORT) 160-4.5 MCG/ACT inhaler Inhale 2 puffs into the lungs daily as needed. For shortness of breath    . Ipratropium-Albuterol (COMBIVENT) 20-100 MCG/ACT AERS respimat Inhale 1 puff into the lungs every 6 (six) hours.    . mirtazapine (REMERON) 15 MG tablet Take 15 mg by mouth at bedtime.     No current facility-administered medications on file prior to visit.    Past Medical History  Diagnosis Date  . Emphysema   . Polysubstance abuse     stopped using crack/cocaine December 4th 20-14  . Sleep apnea   . Hyperlipemia   . Erectile dysfunction     Past Surgical History  Procedure Laterality Date  . Left heart catheterization with coronary angiogram N/A 07/15/2014  Procedure: LEFT HEART CATHETERIZATION WITH CORONARY ANGIOGRAM;  Surgeon: Lennette Bihari, MD;  Location: [facility] CATH LAB;  Service: Cardiovascular;  Laterality: N/A;    Social History   Social History  . Marital Status: Divorced    Spouse Name: N/A  . Number of Children: N/A  . Years of Education: N/A   Social History Main Topics  . Smoking status: Former Smoker -- 1.50 packs/day for 50 years    Types: Cigarettes    Start date: 04/26/1963    Quit date: 09/22/2013  . Smokeless tobacco: Never Used     Comment: patient using the patch, losenges, and medication  . Alcohol Use: No  . Drug Use: No     Comment: history  of crack use and drug tested weekly at the [place]  . Sexual Activity: Not Asked   Other Topics Concern  . None   Social History Narrative   No regular exercise    Family History  Problem Relation Age of Onset  . Diabetes Mother   . Heart disease Father   . Heart attack Father   . Cancer - Other Sister     Review of Systems  Constitutional: Negative for fever and chills.       Low energy  HENT: Negative for hearing loss.   Eyes: Negative for visual disturbance.  Respiratory: Positive for shortness of breath and wheezing. Negative for cough.   Cardiovascular: Positive for chest pain (left side, daily). Negative for palpitations and leg swelling.  Gastrointestinal: Positive for blood in stool (sometimes, colonoscopy 2016 - normal). Negative for nausea, abdominal pain, diarrhea and constipation.       No GERD  Endocrine: Negative for polydipsia and polyuria.  Genitourinary: Positive for frequency. Negative for dysuria, hematuria and difficulty urinating.       Weak stream, bladder retention  Musculoskeletal: Positive for back pain (spasms). Negative for arthralgias.  Neurological: Positive for numbness and headaches (left side of head since being off the oxygen). Negative for dizziness and light-headedness.  Psychiatric/Behavioral: Negative for dysphoric mood. The patient is not nervous/anxious.        Objective:   Filed Vitals:   05/20/15 1433 05/20/15 1522  BP: 152/98 134/90  Pulse: 82   Temp: 98.2 F (36.8 C)   Resp: 18    Filed Weights   05/20/15 1433  Weight: 174 lb (78.926 kg)   Body mass index is 28.5 kg/(m^2).   Physical Exam  Constitutional: He is oriented to person, place, and time. He appears well-developed and well-nourished. No distress.  HENT:  Head: Normocephalic and atraumatic.  Right Ear: External ear normal.  Left Ear: External ear normal.  Mouth/Throat: Oropharynx is clear and moist.  Normal ear canals and tympanic membranes bilaterally  Eyes:  Conjunctivae and EOM are normal.  Neck: Neck supple. No tracheal deviation present. No thyromegaly present.  Right carotid bruit  Cardiovascular: Normal rate, regular rhythm and normal heart sounds.   No murmur heard. Pulmonary/Chest: Effort normal and breath sounds normal. No respiratory distress. He has no wheezes. He has no rales.  Abdominal: Soft. He exhibits no distension. There is no tenderness.  Genitourinary:  Deferred  Musculoskeletal: He exhibits no edema.  Lymphadenopathy:    He has no cervical adenopathy.  Neurological: He is alert and oriented to person, place, and time.  Skin: Skin is warm and dry.  Psychiatric: He has a normal mood and affect. His behavior is normal.        Assessment & Plan:  See Problem List for Assessment and Plan of chronic medical problems.  Follow-up in one month to reevaluate effectiveness of Lyrica

## 2015-05-20 NOTE — Assessment & Plan Note (Signed)
Complaining of frequent urination and incomplete emptying of his bladder [facility] Discussed medication versus urology referral and he would like to see urology-referred today

## 2015-05-20 NOTE — Assessment & Plan Note (Addendum)
Unlikely cardiac in nature, but we will refer to cardiology for further evaluation. He will also see pulmonary. Likely musculoskeletal Cardiac catheterization March 2016 showed normal coronary arteries

## 2015-05-20 NOTE — Assessment & Plan Note (Signed)
Recheck A1c Stressed the importance of low sugar/carbohydrate diet and regular exercise Work on weight loss

## 2015-05-20 NOTE — Assessment & Plan Note (Signed)
Check ultrasound. 

## 2015-05-20 NOTE — Patient Instructions (Addendum)
  We have reviewed your prior records including labs and tests today.  Test(s) ordered today. Your results will be released to MyChart (or called to you) after review, usually within 72hours after test completion. If any changes need to be made, you will be notified at that same time.  Medications reviewed and updated.  Changes include discontinuing the gabapentin and start lyrica for the nerve pain.    Your prescription(s) have been submitted to your pharmacy. Please take as directed and contact our office if you believe you are having problem(s) with the medication(s).  A referral to pulmonary, cardiology and urology have been ordered.   I have ordered an ultrasound of your carotid arteries. Our office will call you to schedule that.   Monitor your BP regularly.    Please schedule followup in 4 weeks

## 2015-05-20 NOTE — Assessment & Plan Note (Signed)
Following with psychiatry at [facility] Taking Remeron 15 mg nightly Stable, controlled

## 2015-05-20 NOTE — Progress Notes (Signed)
Pre visit review using our clinic review tool, if applicable. No additional management support is needed unless otherwise documented below in the visit note. 

## 2015-05-20 NOTE — Assessment & Plan Note (Signed)
He was told by the [facility] for his neuropathy was from [facility] Currently taking gabapentin 100 mg as needed, which is not effective. He does not want to increase the dose because it causes drowsiness We will try Lyrica 50 mg 3 times daily. Reviewed possible side effects We can also try Cymbalta, but he is currently on Remeron and we will need to discuss this with his psychiatrist first Follow-up in one month

## 2015-05-21 ENCOUNTER — Encounter: Payer: Self-pay | Admitting: Internal Medicine

## 2015-05-26 ENCOUNTER — Telehealth: Payer: Self-pay | Admitting: Internal Medicine

## 2015-05-26 ENCOUNTER — Other Ambulatory Visit (INDEPENDENT_AMBULATORY_CARE_PROVIDER_SITE_OTHER): Payer: Medicare Other

## 2015-05-26 DIAGNOSIS — J439 Emphysema, unspecified: Secondary | ICD-10-CM

## 2015-05-26 DIAGNOSIS — R7303 Prediabetes: Secondary | ICD-10-CM

## 2015-05-26 DIAGNOSIS — R0989 Other specified symptoms and signs involving the circulatory and respiratory systems: Secondary | ICD-10-CM

## 2015-05-26 DIAGNOSIS — R079 Chest pain, unspecified: Secondary | ICD-10-CM | POA: Diagnosis not present

## 2015-05-26 DIAGNOSIS — R350 Frequency of micturition: Secondary | ICD-10-CM | POA: Diagnosis not present

## 2015-05-26 DIAGNOSIS — G4733 Obstructive sleep apnea (adult) (pediatric): Secondary | ICD-10-CM | POA: Diagnosis not present

## 2015-05-26 DIAGNOSIS — Z9989 Dependence on other enabling machines and devices: Secondary | ICD-10-CM

## 2015-05-26 DIAGNOSIS — J441 Chronic obstructive pulmonary disease with (acute) exacerbation: Secondary | ICD-10-CM

## 2015-05-26 DIAGNOSIS — G6289 Other specified polyneuropathies: Secondary | ICD-10-CM | POA: Diagnosis not present

## 2015-05-26 DIAGNOSIS — F4310 Post-traumatic stress disorder, unspecified: Secondary | ICD-10-CM | POA: Diagnosis not present

## 2015-05-26 LAB — CBC WITH DIFFERENTIAL/PLATELET
BASOS PCT: 0.6 % (ref 0.0–3.0)
Basophils Absolute: 0 10*3/uL (ref 0.0–0.1)
Eosinophils Absolute: 0.2 10*3/uL (ref 0.0–0.7)
Eosinophils Relative: 4.7 % (ref 0.0–5.0)
HEMATOCRIT: 44.5 % (ref 39.0–52.0)
Hemoglobin: 14.6 g/dL (ref 13.0–17.0)
Lymphocytes Relative: 47.6 % — ABNORMAL HIGH (ref 12.0–46.0)
Lymphs Abs: 2.4 10*3/uL (ref 0.7–4.0)
MCHC: 32.8 g/dL (ref 30.0–36.0)
MCV: 91.1 fl (ref 78.0–100.0)
Monocytes Absolute: 0.5 10*3/uL (ref 0.1–1.0)
Monocytes Relative: 9.4 % (ref 3.0–12.0)
NEUTROS ABS: 1.9 10*3/uL (ref 1.4–7.7)
Neutrophils Relative %: 37.7 % — ABNORMAL LOW (ref 43.0–77.0)
PLATELETS: 237 10*3/uL (ref 150.0–400.0)
RBC: 4.88 Mil/uL (ref 4.22–5.81)
RDW: 14.3 % (ref 11.5–15.5)
WBC: 5 10*3/uL (ref 4.0–10.5)

## 2015-05-26 LAB — LIPID PANEL
CHOLESTEROL: 210 mg/dL — AB (ref 0–200)
HDL: 60.9 mg/dL (ref >39.00)
LDL Cholesterol: 136 mg/dL — ABNORMAL HIGH (ref 0–99)
NonHDL: 148.76
TRIGLYCERIDES: 62 mg/dL (ref 0.0–149.0)
Total CHOL/HDL Ratio: 3
VLDL: 12.4 mg/dL (ref 0.0–40.0)

## 2015-05-26 LAB — COMPREHENSIVE METABOLIC PANEL
ALK PHOS: 81 U/L (ref 39–117)
ALT: 14 U/L (ref 0–53)
AST: 17 U/L (ref 0–37)
Albumin: 4.4 g/dL (ref 3.5–5.2)
BUN: 22 mg/dL (ref 6–23)
CALCIUM: 9.9 mg/dL (ref 8.4–10.5)
CO2: 28 meq/L (ref 19–32)
Chloride: 107 mEq/L (ref 96–112)
Creatinine, Ser: 1.4 mg/dL (ref 0.40–1.50)
GFR: 65.74 mL/min (ref >60.00)
GLUCOSE: 92 mg/dL (ref 70–99)
POTASSIUM: 4.5 meq/L (ref 3.5–5.1)
Sodium: 145 mEq/L (ref 135–145)
TOTAL PROTEIN: 8.3 g/dL (ref 6.0–8.3)
Total Bilirubin: 1 mg/dL (ref 0.2–1.2)

## 2015-05-26 LAB — CK: Total CK: 236 U/L — ABNORMAL HIGH (ref 7–232)

## 2015-05-26 LAB — HEMOGLOBIN A1C: Hgb A1c MFr Bld: 6.2 % (ref 4.6–6.5)

## 2015-05-26 MED ORDER — VALSARTAN 80 MG PO TABS: 80.0000 mg | ORAL_TABLET | Freq: Every day | ORAL | Status: DC

## 2015-05-26 NOTE — Telephone Encounter (Signed)
We need to start him on a medication - take one daily - if he has side effects he should call.  (sent to pof).  Monitor BP.  Follow up with me in about 3 weeks.  Low sodium diet, exercise and weight loss

## 2015-05-26 NOTE — Telephone Encounter (Signed)
Dr. Lawerance Bach wanted him to call in his average bp. He was giving me the numbers separately of 155/94.  Could you please call him to be sure this is blood pressure readings.

## 2015-05-26 NOTE — Telephone Encounter (Signed)
Pt's bps are running 160-150/90s, this morning it was 144/90. Please advise what pt needs to do.

## 2015-05-27 LAB — PSA, TOTAL AND FREE
PSA FREE PCT: 29 % (ref >25)
PSA, Free: 0.14 ng/mL
PSA: 0.49 ng/mL (ref <=4.00)

## 2015-05-27 NOTE — Telephone Encounter (Signed)
Spoke with pt to inform.  

## 2015-05-28 ENCOUNTER — Ambulatory Visit (INDEPENDENT_AMBULATORY_CARE_PROVIDER_SITE_OTHER): Payer: Medicare Other | Admitting: Pulmonary Disease

## 2015-05-28 ENCOUNTER — Encounter: Payer: Self-pay | Admitting: Pulmonary Disease

## 2015-05-28 VITALS — BP 120/58 | HR 68 | Temp 97.3°F | Ht 65.5 in | Wt 172.6 lb

## 2015-05-28 DIAGNOSIS — J449 Chronic obstructive pulmonary disease, unspecified: Secondary | ICD-10-CM | POA: Insufficient documentation

## 2015-05-28 DIAGNOSIS — G4733 Obstructive sleep apnea (adult) (pediatric): Secondary | ICD-10-CM | POA: Diagnosis not present

## 2015-05-28 DIAGNOSIS — Z9989 Dependence on other enabling machines and devices: Secondary | ICD-10-CM

## 2015-05-28 DIAGNOSIS — F4310 Post-traumatic stress disorder, unspecified: Secondary | ICD-10-CM | POA: Diagnosis not present

## 2015-05-28 NOTE — Patient Instructions (Signed)
[name]-- it was a pleasure meeting you today...  We reviewed your available records & checked a breathing test & an oxygen test...  Based on this result I would like you to do the following (we will work within the parameters of the [place] supplied medications)>>    Use the Symbicort160-- 2 sprays twice daily at LUNCH & BEDTIME...    Use the NEBULIZER w/ your Albuterol AND Ipratropium twice daily at [facility]...  Carry the ALBUTEROL-HFA rescue inhaler with you when you are out & about--    Take 1-2 sprays every 6H as needed for wheezing or shortness of breath while exercising...  Try to avoid infections!!!  Call for any questions...  Let's plan a follow up visit in 35mo, sooner if needed for problems.Marland KitchenMarland Kitchen

## 2015-05-28 NOTE — Progress Notes (Signed)
Subjective:     Patient ID: Gerald Williams, male   DOB: 1950-12-11, 65 y.o.   MRN: [id number]  HPI ~  May 28, 2015:  Initial pulmonary evaluation by SN>   24 y/o BM referred by Dr. Eileen Stanford, Lourena Simmonds, for a pulmonary evaluation> He is also followed by the [facility] but we do not have any of their records...      He was told he has severe emphysema by the [facility] but he doesn't know any details of his eval; he is an ex-smoker & quit 2 yrs ago;  He notes SOB w/ min activities, sl cough, denies sputum/ hemoptysis/ etc;  He says he was prev on oxygen but the [facility] stopped [facility];  He is treated w/ Symbicort160- 2spBid and NEBULIZER w/ Albut & IpratropiumQid (he averages 1-2 per day)... He also has OSA evaluated at the [facility] & is on CPAP nightly "set 9-13 Auto" he says & he affirms that it is working well, rests well, wakes refreshed and no daytime sleepiness issues; he uses Home O2 bled into his CPA Qhs...   Smoking Hx>  He is an ex-smoker, starting in his teens, smoked for 50 yrs up to 1+ppd, and quit 2 yrs ago at age 50; est ~60 pack-year smoking hx...  Pulmonary Hx> Hx COPD/ emphysema & OSA [facility] [place]...  Medical Hx>  Hx HBP, HL, prediabetes, CATH 07/15/14 by [facility] R[facility] showed norm coronaries & norm LV w/ EF=60%; C Bruit, Neuropathy, Hx polysubstance abuse- cocaine/ alcohol, & PTSD  Family Hx>  +FamHx of heart disease  Occup Hx>  No known occupational exposures of signif  Current Meds>  Symbicort160-2spBid but only using prn, NEBS w/ Albut & CombiventHFA rescue inhaler prn, ASA81, Diovan80, Lipitor80, Lyrica50Tid, Flexeril10 prn...  EXAM shows Afeb, VSS, O2sat=95% on RA at rest;  HEENT- neg, mallampati2;  Chest- decr BS at bases, clear w/o w/r/r;  Heart- RR, distant heart sounds w/o m/r/g;  Abd- soft, neg;  Ext- decr sensation c/w neuropathy w/o c/c/e...  Note from [facility] 03/2014 indicated 65minWT= 1106 feet w/ 0 breaks & lowest O2sat=94% on 4L/min O2 & heart rate 103/min (he  participated in rehab 11/2013=>03/2014)  CTAngio Chest 03/13/15>  NEG for PE, COPD & mild posterior segmental atx, no adenopathy, effusion, pneumothorax, etc...  CXR 03/13/15 showed hyperinflation & attenuation of the pulm markings, scarring in lingula, otherw clear/ NAD.Marland KitchenMarland Kitchen   EKG 03/13/15 showed NSR, rate67, NAD...  Spirometry 05/28/15>  FVC=1.35 (40%), FEV1=0.56 (21%), %1sec=42, mid-flows reduced at 7% predicted;  This is c/w severe airflow obstruction and GOLD Stage4 COPD  Ambulatory Oximetry on RA 05/28/15>  O2sat=96% on RA at rest w/ pulse=67/min;  He ambulated 3 laps w/ lowest O2sat=89% w/ heart rate up to 99/min...  LABS 04/2015 in Epic>  Chems- ok x Cr=1.4, A1c=6.2;  CBC- wnl...                       CXR 03/13/15  IMP/PLAN>>  Devonta has severe COPD/emphysema w/ very severe airflow obstruction on his spirometry c/w GOLD Stage4 disease;  He is on a reasonable regimen if he'll take it regularly and the meds are avail to him thru the [place] system- rec that he use the SYMBICORT160- 2sp Bid and the DUONEB Bid spacing the treatments out 4 times daily (see AVS);  He needs to stay off all tobacco products, stay active w/ exercise program, and avoid infections;  He will call me for any issues  w/ his breathing & we will plan a f/u in 62mo...    Past Medical History  Diagnosis Date  . Emphysema   . Polysubstance abuse     stopped using crack/cocaine December 4th 20-14  . Sleep apnea   . Hyperlipemia   . Erectile dysfunction     Past Surgical History  Procedure Laterality Date  . Left heart catheterization with coronary angiogram N/A 07/15/2014    Procedure: LEFT HEART CATHETERIZATION WITH CORONARY ANGIOGRAM;  Surgeon: Lennette Bihari, MD;  Location: [facility] C[facility] LAB;  Service: Cardiovascular;  Laterality: N/A;    Outpatient Encounter Prescriptions as of 05/28/2015  Medication Sig  . albuterol (PROVENTIL HFA;VENTOLIN HFA) 108 (90 BASE) MCG/ACT inhaler Inhale 2 puffs into the lungs every 6 (six) hours as  needed for shortness of breath.  Marland Kitchen albuterol (PROVENTIL) (2.5 MG/3ML) 0.083% nebulizer solution Take 2.5 mg by nebulization every 4 (four) hours as needed for wheezing or shortness of breath.   Marland Kitchen albuterol-ipratropium (COMBIVENT) 18-103 MCG/ACT inhaler Inhale 1 puff into the lungs every 6 (six) hours as needed for wheezing.   Marland Kitchen aspirin 81 MG tablet Take 81 mg by mouth daily.  Marland Kitchen atorvastatin (LIPITOR) 80 MG tablet Take 80 mg by mouth at bedtime.  . budesonide-formoterol (SYMBICORT) 160-4.5 MCG/ACT inhaler Inhale 2 puffs into the lungs daily as needed. For shortness of breath  . cyclobenzaprine (FLEXERIL) 10 MG tablet Take 10 mg by mouth 3 (three) times daily as needed for muscle spasms.  . pregabalin (LYRICA) 50 MG capsule Take 1 capsule (50 mg total) by mouth 3 (three) times daily.  . sildenafil (VIAGRA) 100 MG tablet Take 100 mg by mouth daily as needed for erectile dysfunction.  . valsartan (DIOVAN) 80 MG tablet Take 1 tablet (80 mg total) by mouth daily.  . [DISCONTINUED] mirtazapine (REMERON) 15 MG tablet Take 15 mg by mouth at bedtime.  . [DISCONTINUED] Ipratropium-Albuterol (COMBIVENT) 20-100 MCG/ACT AERS respimat Inhale 1 puff into the lungs every 6 (six) hours.   No facility-administered encounter medications on file as of 05/28/2015.    No Known Allergies   Immunization History  Administered Date(s) Administered  . Influenza-Unspecified 01/03/2015    Family History  Problem Relation Age of Onset  . Diabetes Mother   . Heart disease Father   . Heart attack Father   . Cancer - Other Sister     Social History   Social History  . Marital Status: Divorced    Spouse Name: N/A  . Number of Children: N/A  . Years of Education: N/A   Occupational History  . Not on file.   Social History Main Topics  . Smoking status: Former Smoker -- 1.50 packs/day for 50 years    Types: Cigarettes    Start date: 04/26/1963    Quit date: 09/22/2013  . Smokeless tobacco: Never Used      Comment: patient using the patch, losenges, and medication  . Alcohol Use: No  . Drug Use: No     Comment: history of crack use and drug tested weekly at the [place]  . Sexual Activity: Not on file   Other Topics Concern  . Not on file   Social History Narrative   No regular exercise    Current Medications, Allergies, Past Medical History, Past Surgical History, Family History, and Social History were reviewed in [employer] record.   Review of Systems             All symptoms NEG except  where BOLDED >>  Constitutional:  F/C/S, fatigue, anorexia, unexpected weight change. HEENT:  HA, visual changes, hearing loss, earache, nasal symptoms, sore throat, mouth sores, hoarseness. Resp:  cough, sputum, hemoptysis; SOB, tightness, wheezing. Cardio:  CP, palpit, DOE, orthopnea, edema. GI:  N/V/D/C, blood in stool; reflux, abd pain, distention, gas. GU:  dysuria, freq, urgency, hematuria, flank pain, voiding difficulty. MS:  joint pain, swelling, tenderness, decr ROM; neck pain, back pain, etc. Neuro:  HA, tremors, seizures, dizziness, syncope, weakness, numbness, gait abn. Skin:  suspicious lesions or skin rash. Heme:  adenopathy, bruising, bleeding. Psyche:  confusion, agitation, sleep disturbance, hallucinations, anxiety, depression suicidal.   Objective:   Physical Exam       Vital Signs:  Reviewed...  General:  WD, WN, 65 y/o BM in NAD; alert & oriented; pleasant & cooperative... HEENT:  Lamb/AT; Conjunctiva- pink, Sclera- nonicteric, EOM-wnl, PERRLA, Fundi-benign; EACs-clear, TMs-wnl; NOSE-clear; THROAT-clear & wnl. Neck:  Supple w/ fair ROM; no JVD; normal carotid impulses w/o bruits; no thyromegaly or nodules palpated; no lymphadenopathy. Chest:  DecrBS bilat, clear to P & A; without wheezes, rales, or rhonchi heard (diminished BS bilat) Heart:  Regular Rhythm; norm S1 & S2 without murmurs, rubs, or gallops detected. Abdomen:  Soft & nontender- no guarding or  rebound; normal bowel sounds; no organomegaly or masses palpated. Ext:  decrROM; without deformities +arthritic changes; no varicose veins, +venous insuffic, or edema;  Pulses intact w/o bruits. Neuro:  CNs II-XII intact; motor testing normal; sensory testing normal; gait normal & balance OK. Derm:  No lesions noted; no rash etc. Lymph:  No cervical, supraclavicular, axillary, or inguinal adenopathy palpated.   Assessment:      IMP/PLAN>>  Menelik has severe COPD/emphysema w/ very severe airflow obstruction on his spirometry c/w GOLD Stage4 disease;  He is on a reasonable regimen if he'll take it regularly and the meds are avail to him thru the [place] system- rec that he use the SYMBICORT160- 2sp Bid and the DUONEB Bid spacing the treatments out 4 times daily (see AVS);  He needs to stay off all tobacco products, stay active w/ exercise program, and avoid infections;  He will call me for any issues w/ his breathing & we will plan a f/u in 31mo.     Plan:     Patient's Medications  New Prescriptions   No medications on file  Previous Medications   ALBUTEROL (PROVENTIL HFA;VENTOLIN HFA) 108 (90 BASE) MCG/ACT INHALER    Inhale 2 puffs into the lungs every 6 (six) hours as needed for shortness of breath.   ALBUTEROL (PROVENTIL) (2.5 MG/3ML) 0.083% NEBULIZER SOLUTION    Take 2.5 mg by nebulization every 4 (four) hours as needed for wheezing or shortness of breath.    ALBUTEROL-IPRATROPIUM (COMBIVENT) 18-103 MCG/ACT INHALER    Inhale 1 puff into the lungs every 6 (six) hours as needed for wheezing.    ASPIRIN 81 MG TABLET    Take 81 mg by mouth daily.   ATORVASTATIN (LIPITOR) 80 MG TABLET    Take 80 mg by mouth at bedtime.   BUDESONIDE-FORMOTEROL (SYMBICORT) 160-4.5 MCG/ACT INHALER    Inhale 2 puffs into the lungs daily as needed. For shortness of breath   CYCLOBENZAPRINE (FLEXERIL) 10 MG TABLET    Take 10 mg by mouth 3 (three) times daily as needed for muscle spasms.   PREGABALIN (LYRICA) 50 MG  CAPSULE    Take 1 capsule (50 mg total) by mouth 3 (three) times daily.   SILDENAFIL (  VIAGRA) 100 MG TABLET    Take 100 mg by mouth daily as needed for erectile dysfunction.   VALSARTAN (DIOVAN) 80 MG TABLET    Take 1 tablet (80 mg total) by mouth daily.  Modified Medications   No medications on file  Discontinued Medications   IPRATROPIUM-ALBUTEROL (COMBIVENT) 20-100 MCG/ACT AERS RESPIMAT    Inhale 1 puff into the lungs every 6 (six) hours.   MIRTAZAPINE (REMERON) 15 MG TABLET    Take 15 mg by mouth at bedtime.

## 2015-05-29 ENCOUNTER — Telehealth: Payer: Self-pay | Admitting: Internal Medicine

## 2015-05-29 NOTE — Telephone Encounter (Signed)
Spoke to pt to clarify recent lab work.

## 2015-05-29 NOTE — Telephone Encounter (Signed)
Please call patient back in regards to labs  °

## 2015-06-03 ENCOUNTER — Telehealth: Payer: Self-pay | Admitting: Internal Medicine

## 2015-06-03 ENCOUNTER — Other Ambulatory Visit: Payer: Self-pay | Admitting: Internal Medicine

## 2015-06-03 DIAGNOSIS — R0989 Other specified symptoms and signs involving the circulatory and respiratory systems: Secondary | ICD-10-CM

## 2015-06-03 NOTE — Telephone Encounter (Signed)
He should aim for less than 1200 mg of sodium a day - he should start reading food labels.  We can refer to nutrition if he wants for a detailed diet.

## 2015-06-03 NOTE — Telephone Encounter (Signed)
Pt came by wanting to know suggestions for a low sodium diet.  He stated in his visit with you, you wanted him to start cutting back on sodium.

## 2015-06-03 NOTE — Telephone Encounter (Signed)
Please advise 

## 2015-06-04 NOTE — Telephone Encounter (Signed)
Spoke with pt to inform. He stated that he would go through the [place] to get an appt with the nutritionist.

## 2015-06-05 ENCOUNTER — Other Ambulatory Visit: Payer: Self-pay | Admitting: Internal Medicine

## 2015-06-05 ENCOUNTER — Ambulatory Visit (HOSPITAL_COMMUNITY)
Admission: RE | Admit: 2015-06-05 | Discharge: 2015-06-05 | Disposition: A | Discharge status: Home / Self Care | Payer: Medicare Other | Source: Ambulatory Visit | Attending: Cardiovascular Disease | Admitting: Cardiovascular Disease

## 2015-06-05 DIAGNOSIS — E785 Hyperlipidemia, unspecified: Secondary | ICD-10-CM | POA: Insufficient documentation

## 2015-06-05 DIAGNOSIS — R0989 Other specified symptoms and signs involving the circulatory and respiratory systems: Secondary | ICD-10-CM | POA: Diagnosis not present

## 2015-06-05 DIAGNOSIS — I6523 Occlusion and stenosis of bilateral carotid arteries: Secondary | ICD-10-CM | POA: Diagnosis not present

## 2015-06-08 ENCOUNTER — Ambulatory Visit: Payer: Medicare Other | Admitting: Internal Medicine

## 2015-06-12 ENCOUNTER — Encounter: Payer: Self-pay | Admitting: Internal Medicine

## 2015-06-12 ENCOUNTER — Ambulatory Visit (INDEPENDENT_AMBULATORY_CARE_PROVIDER_SITE_OTHER): Payer: Medicare Other | Admitting: Internal Medicine

## 2015-06-12 VITALS — BP 160/82 | HR 69 | Ht 65.5 in | Wt 172.2 lb

## 2015-06-12 DIAGNOSIS — G4733 Obstructive sleep apnea (adult) (pediatric): Secondary | ICD-10-CM

## 2015-06-12 DIAGNOSIS — Z79899 Other long term (current) drug therapy: Secondary | ICD-10-CM | POA: Diagnosis not present

## 2015-06-12 DIAGNOSIS — J449 Chronic obstructive pulmonary disease, unspecified: Secondary | ICD-10-CM | POA: Diagnosis not present

## 2015-06-12 DIAGNOSIS — R079 Chest pain, unspecified: Secondary | ICD-10-CM

## 2015-06-12 DIAGNOSIS — E785 Hyperlipidemia, unspecified: Secondary | ICD-10-CM

## 2015-06-12 DIAGNOSIS — Z9989 Dependence on other enabling machines and devices: Secondary | ICD-10-CM

## 2015-06-12 DIAGNOSIS — G6289 Other specified polyneuropathies: Secondary | ICD-10-CM

## 2015-06-12 DIAGNOSIS — F4310 Post-traumatic stress disorder, unspecified: Secondary | ICD-10-CM

## 2015-06-12 NOTE — Patient Instructions (Signed)
Your physician recommends that you return for lab work - FASTING (lipid, CMET) -- lab is on the first floor - [address]  Your physician wants you to follow-up in: 6 months with Dr. Rennis Golden. You will receive a reminder letter in the mail two months in advance. If you don't receive a letter, please call our office to schedule the follow-up appointment.

## 2015-06-12 NOTE — Progress Notes (Signed)
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 He presents today to establish cardiology care. He reports been having some chest discomfort in the left axillary area for about 4-5 months and actually had similar pain about a year ago. He underwent cardiac catheterization at [facility] by Dr. Tresa Endo which indicated no obstructive coronary disease. 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 Kriste Basque and a history of tobacco abuse and polysubstance use including crack cocaine, but is been clean since 2014. 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 His biggest complaint now is difficulty sleeping at night. He does not feel like he gets a restful night sleep. He has a psychiatrist at the [place] that has him on Remeron. He reports compliance with his CPAP. Blood pressure initially is 160/82 but came down to 130/80. He is on atorvastatin for dyslipidemia and takes daily aspirin.  PMHx:  Past Medical History  Diagnosis Date  . Emphysema   . Polysubstance abuse     stopped using crack/cocaine December 4th 20-14  . Sleep apnea   . Hyperlipemia   . Erectile dysfunction     Past Surgical History  Procedure Laterality Date  . Left heart catheterization with coronary angiogram N/A 07/15/2014    Procedure: LEFT HEART CATHETERIZATION WITH CORONARY ANGIOGRAM;  Surgeon: Lennette Bihari, MD;  Location: [facility] CATH LAB;  Service: Cardiovascular;  Laterality: N/A;    FAMHx:  Family History  Problem Relation Age of Onset  . Diabetes Mother   . Heart disease Father   . Heart attack  Father   . Cancer - Other Sister     SOCHx:   reports that he quit smoking about 20 months ago. His smoking use included Cigarettes. He started smoking about 52 years ago. He has a 75 pack-year smoking history. He has never used smokeless tobacco. He reports that he does not drink alcohol or use illicit drugs.  ALLERGIES:  No Known Allergies  ROS: Pertinent items noted in HPI and remainder of comprehensive ROS otherwise negative.  HOME MEDS: Current Outpatient Prescriptions  Medication Sig Dispense Refill  . albuterol (PROVENTIL HFA;VENTOLIN HFA) 108 (90 BASE) MCG/ACT inhaler Inhale 2 puffs into the lungs every 6 (six) hours as needed for shortness of breath.    Marland Kitchen albuterol (PROVENTIL) (2.5 MG/3ML) 0.083% nebulizer solution Take 2.5 mg by nebulization every 4 (four) hours as needed for wheezing or shortness of breath.     Marland Kitchen albuterol-ipratropium (COMBIVENT) 18-103 MCG/ACT inhaler Inhale 1 puff into the lungs every 6 (six) hours as needed for wheezing.     Marland Kitchen aspirin 81 MG tablet Take 81 mg by mouth daily.    Marland Kitchen atorvastatin (LIPITOR) 80 MG tablet Take 80 mg by mouth at bedtime.    . budesonide-formoterol (SYMBICORT) 160-4.5 MCG/ACT inhaler Inhale 2 puffs into the lungs daily as needed. For shortness of breath    . cyclobenzaprine (FLEXERIL) 10 MG tablet Take 10 mg by mouth 3 (three) times daily as needed for muscle spasms.    Marland Kitchen MIRTAZAPINE PO Take by  mouth at bedtime.    . NON FORMULARY at bedtime. CPAP w/oxygen - followed at [facility]    . pregabalin (LYRICA) 50 MG capsule Take 1 capsule (50 mg total) by mouth 3 (three) times daily. 90 capsule 3  . sildenafil (VIAGRA) 100 MG tablet Take 100 mg by mouth daily as needed for erectile dysfunction.    . valsartan (DIOVAN) 80 MG tablet Take 1 tablet (80 mg total) by mouth daily. 30 tablet 3   No current facility-administered medications for this visit.    LABS/IMAGING: No results found for this or any previous visit (from the past 48 hour(s)). No  results found.  WEIGHTS: Wt Readings from Last 3 Encounters:  06/12/15 172 lb 3.2 oz (78.109 kg)  05/28/15 172 lb 9.6 oz (78.291 kg)  05/20/15 174 lb (78.926 kg)    VITALS: BP 160/82 mmHg  Pulse 69  Ht 5' 5.5" (1.664 m)  Wt 172 lb 3.2 oz (78.109 kg)  BMI 28.21 kg/m2  EXAM: General appearance: alert and no distress Neck: no carotid bruit, no JVD and thyroid not enlarged, symmetric, no tenderness/mass/nodules Lungs: clear to auscultation bilaterally Heart: regular rate and rhythm, S1, S2 normal, no murmur, click, rub or gallop Abdomen: soft, non-tender; bowel sounds normal; no masses,  no organomegaly Extremities: extremities normal, atraumatic, no cyanosis or edema Pulses: 2+ and symmetric Skin: Skin color, texture, turgor normal. No rashes or lesions Neurologic: Grossly normal Psych: Pleasant  EKG: Normal sinus rhythm at 69, nonspecific T wave changes  ASSESSMENT: 1. Persistent chest pain-normal coronary arteries by cardiac catheterization 06/2014 2. Hypertension-controlled 3. Dyslipidemia on Lipitor 4. COPD 5. OSA on CPAP 6. PTSD 7. History polysubstance abuse  PLAN: 1.   Gerald Williams has intermittent sharp pain which is worse with breathing and may be related to COPD and prior lung injury. 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 His hypertension appears well controlled on Diovan. He should have a repeat lipid profile to check the effect of his atorvastatin. We'll get that today since he is fasting. He reports compliance with his CPAP however still feels significant fatigue. I think this is related to PTSD and intrusive dreams at night. He wishes to be proactive about his heart health as his father had 5 heart attacks before he died at age 1.  Plan to see him back in 6 months.  Chrystie Nose, MD, [facility] Attending Cardiologist [facility]  Chrystie Nose 06/12/2015, 8:26 AM

## 2015-06-18 ENCOUNTER — Ambulatory Visit (INDEPENDENT_AMBULATORY_CARE_PROVIDER_SITE_OTHER): Payer: Medicare Other | Admitting: Internal Medicine

## 2015-06-18 ENCOUNTER — Encounter: Payer: Self-pay | Admitting: Internal Medicine

## 2015-06-18 VITALS — BP 116/80 | HR 76 | Temp 98.2°F | Resp 16 | Wt 176.0 lb

## 2015-06-18 DIAGNOSIS — R519 Headache, unspecified: Secondary | ICD-10-CM

## 2015-06-18 DIAGNOSIS — F4310 Post-traumatic stress disorder, unspecified: Secondary | ICD-10-CM

## 2015-06-18 DIAGNOSIS — I10 Essential (primary) hypertension: Secondary | ICD-10-CM

## 2015-06-18 DIAGNOSIS — R51 Headache: Secondary | ICD-10-CM

## 2015-06-18 DIAGNOSIS — G6289 Other specified polyneuropathies: Secondary | ICD-10-CM

## 2015-06-18 NOTE — Progress Notes (Signed)
Pre visit review using our clinic review tool, if applicable. No additional management support is needed unless otherwise documented below in the visit note. 

## 2015-06-18 NOTE — Progress Notes (Signed)
Subjective:     Patient ID: Gerald Williams, male    DOB: 01-Apr-1951, 65 y.o.   MRN: [id number]  HPI He is here for follow up of hypertension and neuropathy.  Neuropathy:  He is taking the lyrica once a day at night and it is helping.  It does make him drowsy and he is only taking it at night.  He has been taking it every other night because it takes that with the remeron it makes him too sleepy.  PTSD, night terrors: this is related to his experience in [place]. He does follow with a psychiatrist at the [place].  He is experiencing the night terrors nightly and Remeron has helped. He has not been able to take it nightly because of the Lyrica. He has never tried any other medication.   Hypertension: He is taking his medication daily. He is compliant with a low sodium diet.  He denies chest pain, palpitations, edema, shortness of breath.. He still has headaches, but they have improved.  He has daily headaches.  The headaches started 6 months ago -  He was taking goody powder.  He is not exercising regularly, but plans on starting.  He does monitor his blood pressure at home and it is good.    Medications and allergies reviewed with patient and updated if appropriate.  Patient Active Problem List   Diagnosis Date Noted  . Dyslipidemia 06/12/2015  . COPD mixed type (HCC) 05/28/2015  . Peripheral neuropathy (HCC) 05/20/2015  . COPD (chronic obstructive pulmonary disease) with emphysema (HCC) 05/20/2015  . PTSD (post-traumatic stress disorder) 05/20/2015  . Right carotid bruit 05/20/2015  . Frequent urination 05/20/2015  . Hematuria 07/15/2014  . History of tobacco abuse:  quit in ~ 06/2013  07/14/2014  . OSA on CPAP 07/14/2014  . Pre-diabetes: last A1C 6.2 (07/01/14) 07/14/2014  . Claudication, R>L 07/14/2014  . Cocaine use, history of 03/26/2011  . Chest pain 03/26/2011    Current Outpatient Prescriptions on File Prior to Visit  Medication Sig Dispense Refill  . albuterol (PROVENTIL  HFA;VENTOLIN HFA) 108 (90 BASE) MCG/ACT inhaler Inhale 2 puffs into the lungs every 6 (six) hours as needed for shortness of breath.    Marland Kitchen albuterol (PROVENTIL) (2.5 MG/3ML) 0.083% nebulizer solution Take 2.5 mg by nebulization every 4 (four) hours as needed for wheezing or shortness of breath.     Marland Kitchen albuterol-ipratropium (COMBIVENT) 18-103 MCG/ACT inhaler Inhale 1 puff into the lungs every 6 (six) hours as needed for wheezing.     Marland Kitchen aspirin 81 MG tablet Take 81 mg by mouth daily.    Marland Kitchen atorvastatin (LIPITOR) 80 MG tablet Take 80 mg by mouth at bedtime.    . budesonide-formoterol (SYMBICORT) 160-4.5 MCG/ACT inhaler Inhale 2 puffs into the lungs daily as needed. For shortness of breath    . cyclobenzaprine (FLEXERIL) 10 MG tablet Take 10 mg by mouth 3 (three) times daily as needed for muscle spasms.    Marland Kitchen MIRTAZAPINE PO Take by mouth at bedtime.    . NON FORMULARY at bedtime. CPAP w/oxygen - followed at [facility]    . pregabalin (LYRICA) 50 MG capsule Take 1 capsule (50 mg total) by mouth 3 (three) times daily. 90 capsule 3  . sildenafil (VIAGRA) 100 MG tablet Take 100 mg by mouth daily as needed for erectile dysfunction.    . valsartan (DIOVAN) 80 MG tablet Take 1 tablet (80 mg total) by mouth daily. 30 tablet 3   No current facility-administered medications  on file prior to visit.    Past Medical History  Diagnosis Date  . Emphysema   . Polysubstance abuse     stopped using crack/cocaine December 4th 20-14  . Sleep apnea   . Hyperlipemia   . Erectile dysfunction     Past Surgical History  Procedure Laterality Date  . Left heart catheterization with coronary angiogram N/A 07/15/2014    Procedure: LEFT HEART CATHETERIZATION WITH CORONARY ANGIOGRAM;  Surgeon: Lennette Bihari, MD;  Location: [facility] CATH LAB;  Service: Cardiovascular;  Laterality: N/A;    Social History   Social History  . Marital Status: Divorced    Spouse Name: N/A  . Number of Children: 4  . Years of Education: N/A    Occupational History  . chef - retired    Social History Main Topics  . Smoking status: Former Smoker -- 1.50 packs/day for 50 years    Types: Cigarettes    Start date: 04/26/1963    Quit date: 09/22/2013  . Smokeless tobacco: Never Used     Comment: patient using the patch, losenges, and medication  . Alcohol Use: No  . Drug Use: No     Comment: history of crack use and drug tested weekly at the [place]  . Sexual Activity: Not Asked   Other Topics Concern  . None   Social History Narrative   No regular exercise   epworth sleepiness scale = 13 (06/12/15)    Family History  Problem Relation Age of Onset  . Diabetes Mother   . Heart disease Father   . Heart attack Father     x5  . Cancer - Other Sister     Review of Systems  Constitutional: Negative for fever and chills.  Eyes: Negative for visual disturbance.  Respiratory: Positive for shortness of breath (chronic, copd) and wheezing. Negative for cough.   Cardiovascular: Negative for chest pain and palpitations.  Neurological: Positive for numbness (neuropathy only) and headaches. Negative for dizziness, weakness and light-headedness.       Objective:   Filed Vitals:   06/18/15 1048  BP: 116/80  Pulse: 76  Temp: 98.2 F (36.8 C)  Resp: 16   Filed Weights   06/18/15 1048  Weight: 176 lb (79.833 kg)   Body mass index is 28.83 kg/(m^2).   Physical Exam Constitutional: Appears well-developed and well-nourished. No distress.  Neck: Neck supple. No tracheal deviation present. No thyromegaly present.  No carotid bruit. No cervical adenopathy.   Cardiovascular: Normal rate, regular rhythm and normal heart sounds.   No murmur heard.  No edema Pulmonary/Chest: Effort normal and breath sounds normal. No respiratory distress. No wheezes.  Neuro: CN I-XII intact, no focal deficiets     Assessment & Plan:    See Problem List for Assessment and Plan of chronic medical problems.  Follow up in 6 months, sooner if  needed  He also follows at the [place] regularly

## 2015-06-18 NOTE — Assessment & Plan Note (Signed)
Improved with better BP control - may be related to [facility] - [facility]

## 2015-06-18 NOTE — Assessment & Plan Note (Signed)
Well-controlled here today and has been controlled at home Continue current dose of Diovan If he does try the prazosin may need to decrease the diovan dose

## 2015-06-18 NOTE — Assessment & Plan Note (Signed)
Pain improved with Lyrica-it does make him drowsy and he is only taking it once a day night Discussed possibly decreasing the dose to 25 mg, but this may not be as effective He will first discuss trying a different medication at night for the night terrors and we can adjust the Lyrica if needed

## 2015-06-18 NOTE — Patient Instructions (Addendum)
Consider trying prazosin for your night terrors - discuss this with your psychiatrist. If you stop the remeron you can take the lyrica nightly for your neuropathy.   The prazosin may also lower your blood pressure so we would need to adjust your current blood pressure medication.   Let me know if you need or want to make any changes.  Follow up in 6 months, sooner if needed.

## 2015-06-18 NOTE — Assessment & Plan Note (Addendum)
Sees Dr Meredith Mody at the [facility] Taking remeron every other night for night terrors - takes lyrica other nights Advised him to discuss prazosin with Dr Meredith Mody this may be effective and he may be able to take the lyrica nightly

## 2015-08-18 ENCOUNTER — Telehealth: Payer: Self-pay | Admitting: Internal Medicine

## 2015-08-18 ENCOUNTER — Telehealth: Payer: Self-pay | Admitting: Pulmonary Disease

## 2015-08-18 DIAGNOSIS — G629 Polyneuropathy, unspecified: Secondary | ICD-10-CM

## 2015-08-18 MED ORDER — BUDESONIDE-FORMOTEROL FUMARATE 160-4.5 MCG/ACT IN AERO: 2.0000 | INHALATION_SPRAY | Freq: Two times a day (BID) | RESPIRATORY_TRACT | Status: DC

## 2015-08-18 MED ORDER — IPRATROPIUM-ALBUTEROL 18-103 MCG/ACT IN AERO: 1.0000 | INHALATION_SPRAY | Freq: Four times a day (QID) | RESPIRATORY_TRACT | Status: DC | PRN

## 2015-08-18 NOTE — Telephone Encounter (Signed)
I think he should see neurology if he has not seen them in the past - let me know and I will refer him

## 2015-08-18 NOTE — Telephone Encounter (Signed)
Spoke with pt. He needs refills on Symbicort and Combivent. Rxs have been sent in. Nothing further was needed.

## 2015-08-18 NOTE — Telephone Encounter (Signed)
Samples have been left up front for pick up. Pt is aware. Nothing further was needed. 

## 2015-08-18 NOTE — Telephone Encounter (Signed)
Please advise 

## 2015-08-18 NOTE — Telephone Encounter (Signed)
States peripheral neuropathy is getting worse causing him to fall.  Needs to know what Dr. Lawerance Bach suggest.

## 2015-08-19 ENCOUNTER — Telehealth: Payer: Self-pay | Admitting: Internal Medicine

## 2015-08-19 NOTE — Telephone Encounter (Signed)
LVM for pt to call back.

## 2015-08-19 NOTE — Telephone Encounter (Signed)
Pt called back to inform that he would like the referral to Neuro

## 2015-08-19 NOTE — Telephone Encounter (Signed)
Referral ordered

## 2015-08-19 NOTE — Telephone Encounter (Signed)
Patient is requesting referral for leg issues

## 2015-08-25 ENCOUNTER — Ambulatory Visit: Payer: Medicare Other | Admitting: Pulmonary Disease

## 2015-08-26 ENCOUNTER — Ambulatory Visit (INDEPENDENT_AMBULATORY_CARE_PROVIDER_SITE_OTHER): Payer: Medicare Other | Admitting: Pulmonary Disease

## 2015-08-26 ENCOUNTER — Encounter: Payer: Self-pay | Admitting: Pulmonary Disease

## 2015-08-26 VITALS — BP 152/98 | HR 61 | Temp 98.9°F | Ht 65.5 in | Wt 168.6 lb

## 2015-08-26 DIAGNOSIS — F4310 Post-traumatic stress disorder, unspecified: Secondary | ICD-10-CM

## 2015-08-26 DIAGNOSIS — G4733 Obstructive sleep apnea (adult) (pediatric): Secondary | ICD-10-CM | POA: Diagnosis not present

## 2015-08-26 DIAGNOSIS — Z9989 Dependence on other enabling machines and devices: Secondary | ICD-10-CM

## 2015-08-26 DIAGNOSIS — J449 Chronic obstructive pulmonary disease, unspecified: Secondary | ICD-10-CM | POA: Diagnosis not present

## 2015-08-26 MED ORDER — UMECLIDINIUM BROMIDE 62.5 MCG/INH IN AEPB: 1.0000 | INHALATION_SPRAY | Freq: Every day | RESPIRATORY_TRACT | Status: DC

## 2015-08-26 NOTE — Progress Notes (Addendum)
Subjective:     Patient ID: Gerald Williams, male   DOB: 29-Aug-1950, 65 y.o.   MRN: [id number]  HPI  ~  May 28, 2015:  Initial pulmonary evaluation by SN>   56 y/o BM referred by Dr.Stacey Burns, [facility], for a pulmonary evaluation> He is also followed by the [place] but we do not have any of their records...      He was told he has severe emphysema by the [facility] but he doesn't know any details of his eval; he is an ex-smoker & quit 2 yrs ago;  He notes SOB w/ min activities, sl cough, denies sputum/ hemoptysis/ etc;  He says he was prev [facility] but [facility];  He is treated w/ Symbicort160- 2spBid and NEBULIZER w/ Albut & IpratropiumQid (he averages 1-2 per day)... He also has OSA evaluated at the [facility] & is on CPAP nightly "set 9-13 Auto" he says & he affirms that it is working well, rests well, wakes refreshed and no daytime sleepiness issues; he uses Home O2 bled into his CPAP Qhs...   Smoking Hx>  He is an ex-smoker, starting in his teens, smoked for 50 yrs up to 1+ppd, and quit 2 yrs ago at age 19; est ~60 pack-year smoking hx...  Pulmonary Hx> Hx COPD/ emphysema & [facility] [place]...  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  Current Meds>  Symbicort160-2spBid but only using prn, NEBS w/ Albut & CombiventHFA rescue inhaler prn, ASA81, Diovan80, Lipitor80, Lyrica50Tid, Flexeril10 prn... EXAM shows Afeb, VSS, O2sat=95% on RA at rest;  HEENT- neg, mallampati2;  Chest- decr BS at bases, clear w/o w/r/r;  Heart- RR, distant heart sounds w/o m/r/g;  Abd- soft, neg;  Ext- decr sensation c/w neuropathy w/o c/c/e...  [facility] from [facility] 03/2014  indicated 33minWT= 1106 feet w/ 0 breaks & lowest O2sat=94% on 4L/min O2 & heart rate 103/min (he participated in rehab 11/2013=>03/2014)  CTAngio Chest 03/13/15>  NEG for PE, COPD & mild posterior segmental atx, no adenopathy, effusion, pneumothorax, etc...  CXR 03/13/15 showed hyperinflation & attenuation of the pulm markings, scarring in lingula, otherw clear/ NAD.Marland KitchenMarland Kitchen   EKG 03/13/15 showed NSR, rate67, NAD...  Spirometry 05/28/15>  FVC=1.35 (40%), FEV1=0.56 (21%), %1sec=42, mid-flows reduced at 7% predicted;  This is c/w severe airflow obstruction and GOLD Stage4 COPD  Ambulatory Oximetry on RA 05/28/15>  O2sat=96% on RA at rest w/ pulse=67/min;  He ambulated 3 laps w/ lowest O2sat=89% w/ heart rate up to 99/min...  LABS 04/2015 in Epic>  Chems- ok x Cr=1.4, A1c=6.2;  CBC- wnl...                       CXR 03/13/15  IMP/PLAN>>  Gerald Williams has severe COPD/emphysema w/ very severe airflow obstruction on his spirometry c/w GOLD Stage4 disease;  He is on a reasonable regimen if he'll take it regularly and the meds are avail to him thru the [place] system- rec that he use the SYMBICORT160- 2sp Bid and the DUONEB Bid spacing the treatments out 4 times daily (see AVS);  He needs to stay off  all tobacco products, stay active w/ exercise program, and avoid infections;  He will call me for any issues w/ his breathing & we will plan a f/u in 70mo...  ~  Aug 26, 2015:  70mo ROV w/ SN>  Gerald Williams is an ex-smoker, quit 2 yrs ago w/ a 60+pack-yr hx, & severe GOLD Stage 4 COPD w/ FEV1=0.56 (21%) Feb2017=> we adjusted his bronchodil regimen w/ NEBULIZER W? Duoneb Bid & SYMBICORT160-2spBid (gets meds from the [place]);  We discussed avoiding infections, and incr exercise;  He feels that his breathing is worse & he is going thru his rescue inhaler Q3wks;  Notes incr cough & some greenish sput over the last few weeks;  He has midsternal CP & a HA each AM that resolves after he takes his BP meds;  He reports a fall last week 7 has a follow up appt  w/ Neurology coming up (neuropathy)...     Severe COPD/emphysema w/ GOLD Stage 4 disease and FEV1=0.56 (21%)>  He was prev on [facility]O2 but [facility];     Ex-smoker w/ 60+pack-yr hx smoking and he quit 2015    OSA on CPAP nightly "set 9-13 Auto">  eval and Rx from the [place] & we do not have their data but he confirms good compliance & efficacy- rests well, no daytime hypersomnolence, O2 is bled into the CPAP Qhs by his hx.    Medical issues>  HBP, HL, prediabetes, CATH 07/15/14 by Howell Rucks showed norm coronaries & norm LV w/ EF=60%; C Bruit, Neuropathy, Hx polysubstance abuse- cocaine/ alcohol, & PTSD EXAM shows Afeb, VSS, O2sat=95% on RA at rest;  HEENT- neg, mallampati2;  Chest- decr BS at bases, clear w/o w/r/r;  Heart- RR, distant heart sounds w/o m/r/g;  Abd- soft, neg;  Ext- decr sensation c/w neuropathy w/o c/c/e... IMP/PLAN>>  We need to maximize Gerald Williams's bronchodil regimen- increase NEB w/ DUONEB to TID ([facility] gives him separate Albut+Ipratropium), continue SYMBICORT160-2spBid, add INCRUSE once daily;  He notes that PTSD is under better control...    Past Medical History  Diagnosis Date  . Emphysema   . Polysubstance abuse     stopped using crack/cocaine December 4th 20-14  . Sleep apnea   . Hyperlipemia   . Erectile dysfunction     Past Surgical History  Procedure Laterality Date  . Left heart catheterization with coronary angiogram N/A 07/15/2014    Procedure: LEFT HEART CATHETERIZATION WITH CORONARY ANGIOGRAM;  Surgeon: Lennette Bihari, MD;  Location: [facility] CATH LAB;  Service: Cardiovascular;  Laterality: N/A;    Outpatient Encounter Prescriptions as of 08/26/2015  Medication Sig  . albuterol (PROVENTIL HFA;VENTOLIN HFA) 108 (90 BASE) MCG/ACT inhaler Inhale 2 puffs into the lungs every 6 (six) hours as needed for shortness of breath.  Marland Kitchen albuterol (PROVENTIL) (2.5 MG/3ML) 0.083% nebulizer solution Take 2.5 mg by nebulization 3 (three) times daily.   Marland Kitchen albuterol-ipratropium (COMBIVENT)  18-103 MCG/ACT inhaler Inhale 1 puff into the lungs every 6 (six) hours as needed for wheezing.  Marland Kitchen aspirin 81 MG tablet Take 81 mg by mouth daily.  Marland Kitchen atorvastatin (LIPITOR) 80 MG tablet Take 80 mg by mouth at bedtime.  . budesonide-formoterol (SYMBICORT) 160-4.5 MCG/ACT inhaler Inhale 2 puffs into the lungs 2 (two) times daily.  . cyclobenzaprine (FLEXERIL) 10 MG tablet Take 10 mg by mouth 3 (three) times daily as needed for muscle spasms.  Marland Kitchen ipratropium (ATROVENT) 0.02 % nebulizer solution Take 0.5 mg by nebulization 3 (three) times daily.  Marland Kitchen MIRTAZAPINE  PO Take by mouth at bedtime.  . NON FORMULARY at bedtime. CPAP w/oxygen - followed at [facility]  . pregabalin (LYRICA) 50 MG capsule Take 1 capsule (50 mg total) by mouth 3 (three) times daily.  . sildenafil (VIAGRA) 100 MG tablet Take 100 mg by mouth daily as needed for erectile dysfunction.  . valsartan (DIOVAN) 80 MG tablet Take 1 tablet (80 mg total) by mouth daily.    No Known Allergies   Immunization History  Administered Date(s) Administered  . Influenza-Unspecified 01/03/2015    Current Medications, Allergies, Past Medical History, Past Surgical History, Family History, and Social History were reviewed in [employer] record.   Review of Systems             All symptoms NEG except where BOLDED >>  Constitutional:  F/C/S, fatigue, anorexia, unexpected weight change. HEENT:  HA, visual changes, hearing loss, earache, nasal symptoms, sore throat, mouth sores, hoarseness. Resp:  cough, sputum, hemoptysis; SOB, tightness, wheezing. Cardio:  CP, palpit, DOE, orthopnea, edema. GI:  N/V/D/C, blood in stool; reflux, abd pain, distention, gas. GU:  dysuria, freq, urgency, hematuria, flank pain, voiding difficulty. MS:  joint pain, swelling, tenderness, decr ROM; neck pain, back pain, etc. Neuro:  HA, tremors, seizures, dizziness, syncope, weakness, numbness, gait abn. Skin:  suspicious lesions or skin rash. Heme:   adenopathy, bruising, bleeding. Psyche:  confusion, agitation, sleep disturbance, hallucinations, anxiety, depression suicidal.   Objective:   Physical Exam       Vital Signs:  Reviewed...  General:  WD, WN, 65 y/o BM in NAD; alert & oriented; pleasant & cooperative... HEENT:  [place]/AT; Conjunctiva- pink, Sclera- nonicteric, EOM-wnl, PERRLA, Fundi-benign; EACs-clear, TMs-wnl; NOSE-clear; THROAT-clear & wnl. Neck:  Supple w/ fair ROM; no JVD; normal carotid impulses w/o bruits; no thyromegaly or nodules palpated; no lymphadenopathy. Chest:  DecrBS bilat, clear to P & A; without wheezes, rales, or rhonchi heard (diminished BS bilat) Heart:  Regular Rhythm; norm S1 & S2 without murmurs, rubs, or gallops detected. Abdomen:  Soft & nontender- no guarding or rebound; normal bowel sounds; no organomegaly or masses palpated. Ext:  decrROM; without deformities +arthritic changes; no varicose veins, +venous insuffic, or edema;  Pulses intact w/o bruits. Neuro:  CNs II-XII intact; motor testing normal; sensory testing normal; gait normal & balance OK. Derm:  No lesions noted; no rash etc. Lymph:  No cervical, supraclavicular, axillary, or inguinal adenopathy palpated.   Assessment:      IMP >>     Severe COPD/emphysema w/ GOLD Stage 4 disease and FEV1=0.56 (21%)>  He was prev on [facility] but [facility];     Ex-smoker w/ 60+pack-yr hx smoking and he quit 2015    OSA on CPAP nightly "set 9-13 Auto">  eval and Rx from the [place] & we do not have their data but he confirms good compliance & efficacy- rests well, no daytime hypersomnolence, O2 is bled into the CPAP Qhs by his hx.    Medical issues>  HBP, HL, prediabetes, CATH 07/15/14 by Howell Rucks showed norm coronaries & norm LV w/ EF=60%; C Bruit, Neuropathy, Hx polysubstance abuse- cocaine/ alcohol, & PTSD  PLAN>>   05/28/15>   Pasha has severe COPD/emphysema w/ very severe airflow obstruction on his spirometry c/w GOLD Stage4 disease;  He is on a  reasonable regimen if he'll take it regularly and the meds are avail to him thru the [place] system- rec that he use the SYMBICORT160- 2sp Bid and the DUONEB Bid  spacing the treatments out 4 times daily (see AVS);  He needs to stay off all tobacco products, stay active w/ exercise program, and avoid infections;   5/3>   We need to maximize Gerald Williams's bronchodil regimen- increase NEB w/ DUONEB to TID ([facility] gives him separate Albut+Ipratropium), continue SYMBICORT160-2spBid, add INCRUSE once daily;  He notes that PTSD is under better control...     Plan:     Patient's Medications  New Prescriptions   UMECLIDINIUM BROMIDE (INCRUSE ELLIPTA) 62.5 MCG/INH AEPB    Inhale 1 puff into the lungs daily.  Previous Medications   ALBUTEROL (PROVENTIL HFA;VENTOLIN HFA) 108 (90 BASE) MCG/ACT INHALER    Inhale 2 puffs into the lungs every 6 (six) hours as needed for shortness of breath.   ALBUTEROL (PROVENTIL) (2.5 MG/3ML) 0.083% NEBULIZER SOLUTION    Take 2.5 mg by nebulization 3 (three) times daily.    ALBUTEROL-IPRATROPIUM (COMBIVENT) 18-103 MCG/ACT INHALER    Inhale 1 puff into the lungs every 6 (six) hours as needed for wheezing.   ASPIRIN 81 MG TABLET    Take 81 mg by mouth daily.   ATORVASTATIN (LIPITOR) 80 MG TABLET    Take 80 mg by mouth at bedtime.   BUDESONIDE-FORMOTEROL (SYMBICORT) 160-4.5 MCG/ACT INHALER    Inhale 2 puffs into the lungs 2 (two) times daily.   CYCLOBENZAPRINE (FLEXERIL) 10 MG TABLET    Take 10 mg by mouth 3 (three) times daily as needed for muscle spasms.   IPRATROPIUM (ATROVENT) 0.02 % NEBULIZER SOLUTION    Take 0.5 mg by nebulization 3 (three) times daily.   MIRTAZAPINE PO    Take by mouth at bedtime.   NON FORMULARY    at bedtime. CPAP w/oxygen - [facility] at [facility]   PREGABALIN (LYRICA) 50 MG CAPSULE    Take 1 capsule (50 mg total) by mouth 3 (three) times daily.   SILDENAFIL (VIAGRA) 100 MG TABLET    Take 100 mg by mouth daily as needed for erectile dysfunction.   VALSARTAN (DIOVAN) 80 MG  TABLET    Take 1 tablet (80 mg total) by mouth daily.  Modified Medications   No medications on file  Discontinued Medications   No medications on file

## 2015-08-26 NOTE — Patient Instructions (Signed)
Gerald Williams-- it was good seeing you again!  We decided to adjust your medication regimen to maximize the bronchodilator Rx 7 help your breathing>>    Use your NEBULIZER w/ the ALBUTEROL & IPRATROPIUM 3 times daily (morn, midday, eve)    Follow the morning & evening NEB Rx w/ your SYMBICORT- 2 sprays...    Follow the mid-day NEB Rx w/ the new INCRUSE one inhalation daily...  Call for any questions...  Let's plan a follow up visit in 81mo, sooner if needed for problems.Marland KitchenMarland Kitchen

## 2015-09-23 ENCOUNTER — Other Ambulatory Visit (INDEPENDENT_AMBULATORY_CARE_PROVIDER_SITE_OTHER): Payer: Medicare Other

## 2015-09-23 ENCOUNTER — Ambulatory Visit (INDEPENDENT_AMBULATORY_CARE_PROVIDER_SITE_OTHER): Payer: Medicare Other | Admitting: Neurology

## 2015-09-23 ENCOUNTER — Encounter: Payer: Self-pay | Admitting: Neurology

## 2015-09-23 VITALS — BP 148/96 | HR 65 | Ht 65.5 in | Wt 167.1 lb

## 2015-09-23 DIAGNOSIS — G621 Alcoholic polyneuropathy: Secondary | ICD-10-CM

## 2015-09-23 DIAGNOSIS — M792 Neuralgia and neuritis, unspecified: Secondary | ICD-10-CM

## 2015-09-23 DIAGNOSIS — R269 Unspecified abnormalities of gait and mobility: Secondary | ICD-10-CM

## 2015-09-23 LAB — FOLATE: Folate: 15.1 ng/mL (ref >5.9)

## 2015-09-23 LAB — VITAMIN B12: Vitamin B-12: 368 pg/mL (ref 211–911)

## 2015-09-23 LAB — SEDIMENTATION RATE: SED RATE: 19 mm/h (ref 0–20)

## 2015-09-23 MED ORDER — NORTRIPTYLINE HCL 10 MG PO CAPS: 10.0000 mg | ORAL_CAPSULE | Freq: Every day | ORAL | Status: DC

## 2015-09-23 NOTE — Patient Instructions (Addendum)
1.  Check blood work 2.  NCS/EMG of the lower extremities 3.  Start nortriptyline  at bedtime.  Call me with an update in 3-4 weeks.  4.  Stop Lyrica  5.  Start physical therapy for gait training  Return to clinic in 3 months.  [facility]  Preventing Falls in the Home   Falls are common, often dreaded events in the lives of older people. Aside from the obvious injuries and even death that may result, falls can cause wide-ranging consequences including loss of independence, mental decline, decreased activity, and mobility. Younger people are also at risk of falling, especially those with chronic illnesses and fatigue.  Ways to reduce the risk for falling:  * Examine diet and medications. Warm foods and alcohol dilate blood vessels, which can lead to dizziness when standing. Sleep aids, antidepressants, and pain medications can also increase the likelihood of a fall.  * Get a vison exam. Poor vision, cataracts, and glaucoma increase the chances of falling.  * Check foot gear. Shoes should fit snugly and have a sturdy, nonskid sole and broad, low heel.  * Participate in a physician-approved exercise program to build and maintain muscle strength and improve balance and coordination.  * Increase vitamin D intake. Vitamin D improves muscle strength and increases the amount of calcium the body is able to absorb and deposit in bones.  How to prevent falls from common hazards:  * Floors - Remove all loose wires, cords, and throw rugs. Minimize clutter. Make sure rugs are anchored and smooth. Keep furniture in its usual place.  * Chairs - Use chairs with straight backs, armrests, and firm seats. Add firm cushions to existing pieces to add height.  * Bathroom - Install grab bars and non-skid tape in the tub or shower. Use a bathtub transfer bench or a shower chair with a back support. Use an elevated toilet seat and/or safety rails to assist standing from a low surface. Do not use towel racks or  bathroom tissue holders to help you stand.  * Lighting - Make sure halls, stairways, and entrances are well-lit. Install a night light in your bathroom or hallway. Make sure there is a light switch at the top and bottom of the staircase. Turn lights on if you get up in the middle of the night. Make sure lamps or light switches are within reach of the bed if you have to get up during the night.  * Kitchen - Install non-skid rubber mats near the sink and stove. Clean spills immediately. Store frequently used utensils, pots, and pans between waist and eye level. This helps prevent reaching and bending. Sit when getting things out of the lower cupboards.  * Living room / Bedrooms - Place furniture with wide spaces in between, giving enough room to move around. Establish a route through the living room that gives you something to hold onto as you walk.  * Stairs - Make sure treads, rails, and rugs are secure. Install a rail on both sides of the stairs. If stairs are a threat, it might be helpful to arrange most of your activities on the lower level to reduce the number of times you must climb the stairs.  * Entrances and doorways - Install metal handles on the walls adjacent to the doorknobs of all doors to make it more secure as you travel through the doorway.  Tips for maintaining balance:  * Keep at least one hand free at all times Try using a  backpack or fanny pack to hold things rather than carrying them in your hands. Never carry objects in both hands when walking as this interferes with keeping your balance.  * Attempt to swing both arms from front to back while walking. This might require a conscious effort if Parkinson's disease has diminished your movement. It will, however, help you to maintain balance and posture, and reduce fatigue.  * Consciously lift your feet off the ground when walking. Shuffling and dragging of the feet is a common culprit in losing your balance.  * When trying to navigate  turns, use a "U" technique of facing forward and making a wide turn, rather than pivoting sharply.  * Try to stand with your feet shoulder-length apart. When your feet are close together for any length of time, you increase your risk of losing your balance and falling.  * Do one thing at a time. Do not try to walk and accomplish another task, such as reading or looking around. The decrease in your automatic reflexes complicates motor function, so the less distraction, the better.  * Do not wear rubber or gripping soled shoes, they might "catch" on the floor and cause tripping.  * Move slowly when changing positions. Use deliberate, concentrated movements and, if needed, use a grab bar or walking aid. Count fifteen (15) seconds after standing to begin walking.  * If balance is a continuous problem, you might want to consider a walking aid such as a cane, walking stick, or walker. Once you have mastered walking with help, you may be ready to try it again on your own.  This information is provided by [facility] and is not intended to replace the medical advice of your physician or other health care providers. Please consult your physician or other health care providers for advice regarding your specific medical condition.

## 2015-09-23 NOTE — Progress Notes (Signed)
[facility] Clinic Note - Initial Visit   Date: 09/23/2015  Gerald Williams MRN: [id number] DOB: 25-Jun-1950   Dear Dr. Binnie Rail, MD:  Thank you for your kind referral of Gerald Williams for consultation of neuropathy. Although his history is well known to you, please allow [place] to reiterate it for the purpose of our medical record. The patient was accompanied to the clinic by self.    History of Present Illness: Gerald Williams is a 65 y.o. right-handed African American male with hypertension, hyperlipidemia, COPD, OSA on CPAP, PTSD, prediabetes, and history of cocaine and tobacco abuse presenting for evaluation of neuropathy and gait instability.    Starting around 2012, he began having burning and tingling pain feet.  He also complains of tight sensation over the lower legs.  He endorses weakness and imbalance. He researched his symptoms and talked to his PCP about peripheral neuropathy.  In 2013, he was diagnosed with peripheral neuropathy based on clinical symptoms and exam and started on gabapentin 461m at bedtime, but he developed lingering cognitive side effects, even on lower dosages.  He established case with Dr. BQuay Burow new PCP, in 2017 and started on Lyrica 52mat bedtime but complains of blurred vision, and no relief of pain.  He has not had electrodiagnostic testing.  He reports falling twice this year (1) in April, he was walking to his car and legs gave out resulting in him falling and hitting his face on the side of the car and loosing a tooth, (2) in May, he fell at a restaurant because his legs gave out and fell on his knees.  He was able to stand to rise with help of someone.   He began using a cane after his first fall.    [place] and was in the reserves for 1994.  He was was exposed to [employer] [place] He developed mild tingling but does not recall it being anything like what he is experiencing today, but  never completely resolved.  He felt as if his legs were always tired.   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  He is on disability but would like to go back to working part-time.    He was previously drinking 3-4 alcoholic drink, mostly liquor, for 40+ years, quit in 2014.  Out-side paper records, electronic medical record, and images have been reviewed where available and summarized as:  Lab Results  Component Value Date   TSH 1.667 07/14/2014   Lab Results  Component Value Date   HGBA1C 6.2 05/26/2015    Past Medical History  Diagnosis Date  . Emphysema   . Polysubstance abuse     stopped using crack/cocaine December 4th 20-14  . Sleep apnea   . Hyperlipemia   . Erectile dysfunction     Past Surgical History  Procedure Laterality Date  . Left heart catheterization with coronary angiogram N/A 07/15/2014    Procedure: LEFT HEART CATHETERIZATION WITH CORONARY ANGIOGRAM;  Surgeon: ThTroy SineMD;  [facility];  Service: Cardiovascular;  Laterality: N/A;     Medications:  Outpatient Encounter Prescriptions as of 09/23/2015  Medication Sig  . albuterol (PROVENTIL HFA;VENTOLIN HFA) 108 (90 BASE) MCG/ACT inhaler Inhale 2 puffs into the lungs every 6 (six) hours as needed for shortness of breath.  . Marland Kitchenlbuterol (PROVENTIL) (2.5 MG/3ML) 0.083% nebulizer solution Take 2.5 mg by nebulization 3 (three)  times daily.   Marland Kitchen albuterol-ipratropium (COMBIVENT) 18-103 MCG/ACT inhaler Inhale 1 puff into the lungs every 6 (six) hours as needed for wheezing.  Marland Kitchen aspirin 81 MG tablet Take 81 mg by mouth daily.  Marland Kitchen atorvastatin (LIPITOR) 80 MG tablet Take 80 mg by mouth at bedtime.  . budesonide-formoterol (SYMBICORT) 160-4.5 MCG/ACT inhaler Inhale 2 puffs into the lungs 2 (two) times daily.  . cyclobenzaprine (FLEXERIL) 10 MG tablet Take 10 mg by mouth 3 (three) times daily as needed for muscle spasms.  Marland Kitchen ipratropium (ATROVENT) 0.02 % nebulizer  solution Take 0.5 mg by nebulization 3 (three) times daily.  Marland Kitchen MIRTAZAPINE PO Take by mouth at bedtime.  . NON FORMULARY at bedtime. CPAP w/oxygen - [facility] at [facility]  . pregabalin (LYRICA) 50 MG capsule Take 1 capsule (50 mg total) by mouth 3 (three) times daily.  . sildenafil (VIAGRA) 100 MG tablet Take 100 mg by mouth daily as needed for erectile dysfunction.  Marland Kitchen umeclidinium bromide (INCRUSE ELLIPTA) 62.5 MCG/INH AEPB Inhale 1 puff into the lungs daily.  . valsartan (DIOVAN) 80 MG tablet Take 1 tablet (80 mg total) by mouth daily.   No facility-administered encounter medications on file as of 09/23/2015.     Allergies: No Known Allergies  Family History: Family History  Problem Relation Age of Onset  . Diabetes Mother   . Heart disease Father   . Heart attack Father     x5  . Cancer - Other Sister     Social History: Social History  Substance Use Topics  . Smoking status: Former Smoker -- 1.50 packs/day for 50 years    Types: Cigarettes    Start date: 04/26/1963    Quit date: 09/22/2013  . Smokeless tobacco: Never Used     Comment: patient using the patch, losenges, and medication  . Alcohol Use: No   Social History   Social History Narrative   No regular exercise   epworth sleepiness scale = 13 (06/12/15)   Lives alone in a one story home.  Has 4 children and 9 grandchildren.  Works as a Training and development officer.  Wants to keep working but it is hard for him to stand.  Education: 14 years and 46 years of [employer]      Review of Systems:  CONSTITUTIONAL: No fevers, chills, night sweats, or weight loss.   EYES: No visual changes or eye pain ENT: No hearing changes.  No history of nose bleeds.   RESPIRATORY: No cough, wheezing +shortness of breath.   CARDIOVASCULAR: Negative for chest pain, and palpitations.   GI: Negative for abdominal discomfort, blood in stools or black stools.  No recent change in bowel habits.   GU:  No history of incontinence.   MUSCLOSKELETAL: No history of joint  pain or swelling.  No myalgias.   SKIN: Negative for lesions, rash, and itching.   HEMATOLOGY/ONCOLOGY: Negative for prolonged bleeding, bruising easily, and swollen nodes.  No history of cancer.   ENDOCRINE: Negative for cold or heat intolerance, polydipsia or goiter.   PSYCH:  +depression or anxiety symptoms.   NEURO: As Above.   Vital Signs:  BP 148/96 mmHg  Pulse 65  Ht 5' 5.5" (1.664 m)  Wt 167 lb 2 oz (75.807 kg)  BMI 27.38 kg/m2  SpO2 93% Pain Scale: 7 on a scale of 0-10   General Medical Exam:   General:  Well appearing, comfortable.   Eyes/ENT: see cranial nerve examination.   Neck: No masses appreciated.  Full range of motion  without tenderness.  No carotid bruits. Respiratory:  Clear to auscultation, good air entry bilaterally.   Cardiac:  Regular rate and rhythm, no murmur.   Extremities:  No deformities, edema, or skin discoloration.  Skin:  No rashes or lesions.  Neurological Exam: MENTAL STATUS including orientation to time, place, person, recent and remote memory, attention span and concentration, language, and fund of knowledge is normal.  Speech is not dysarthric.  CRANIAL NERVES: II:  No visual field defects.  Unremarkable fundi.   III-IV-VI: Pupils equal round and reactive to light. Lens appears opaque.  Normal conjugate, extra-ocular eye movements in all directions of gaze.  No nystagmus.  No ptosis.   V:  Normal facial sensation.    VII:  Normal facial symmetry and movements.  No pathologic facial reflexes.  VIII:  Normal hearing and vestibular function.   IX-X:  Normal palatal movement.   XI:  Normal shoulder shrug and head rotation.   XII:  Normal tongue strength and range of motion, no deviation or fasciculation.  MOTOR:  No atrophy, fasciculations or abnormal movements.  No pronator drift.  Tone is normal.    Right Upper Extremity:    Left Upper Extremity:    Deltoid  5/5   Deltoid  5/5   Biceps  5/5   Biceps  5/5   Triceps  5/5   Triceps  5/5     Wrist extensors  5/5   Wrist extensors  5/5   Wrist flexors  5/5   Wrist flexors  5/5   Finger extensors  5/5   Finger extensors  5/5   Finger flexors  5/5   Finger flexors  5/5   Dorsal interossei  5/5   Dorsal interossei  5/5   Abductor pollicis  5/5   Abductor pollicis  5/5   Tone (Ashworth scale)  0  Tone (Ashworth scale)  0   Right Lower Extremity:    Left Lower Extremity:    Hip flexors  5/5   Hip flexors  5/5   Hip extensors  5/5   Hip extensors  5/5   Knee flexors  5/5   Knee flexors  5/5   Knee extensors  5/5   Knee extensors  5/5   Dorsiflexors  5/5   Dorsiflexors  5/5   Plantarflexors  5/5   Plantarflexors  5/5   Toe extensors  5/5   Toe extensors  5/5   Toe flexors  5/5   Toe flexors  5/5   Tone (Ashworth scale)  0  Tone (Ashworth scale)  0   MSRs:  Right                                                                 Left brachioradialis 2+  brachioradialis 2+  biceps 2+  biceps 2+  triceps 2+  triceps 2+  patellar 3+  patellar 3+  ankle jerk 0  ankle jerk 0  Hoffman no  Hoffman no  plantar response down  plantar response down   SENSORY:  Vibration reduced to 45% at the knees, 30% at the ankles, and trace at the great toe bilaterally.  Hyperesthesia with pin prick over the feet.  Impaired proprioception at great toe.  Mild sway with Rhomberg testing.  COORDINATION/GAIT:  Normal finger-to- nose-finger.  Intact rapid alternating movements bilaterally.  Able to rise from a chair without using arms, with effort.  Gait is slow, stable.  He is unable to perform tandem gait.  Stressed gait intact.   IMPRESSION: Gerald Williams is a 65 year-old gentleman referred for evaluation of bilateral feet paresthesias and leg pain. His exam shows gradient pattern of sensory loss involving the lower extremities with intact motor strength.  His reflexes are slightly brisk at the patella also suggesting possibility of underlying lumbar canal stenosis.   NCS/EMG of the lower extremities will  be ordered to better characterize the nature of his symptoms.  The most likely etiology is alcohol use and to a lesser extent toxic exposure to [place].  Neurotoxic effects of Agent Orange usually cause subacute neuropathy usually with symptoms onset within the 1 year and gradual improvement over time.  However, with Gerald Williams, he may have developed mild symptoms with initial exposure, but there continued to be ongoing worsening paresthesias even 30-40 years following this, which favors alcohol to be likely etiology.  I praised him for abstaining from alcohol since 2013.  Prediabetes also is a risk factor for neuropathy, but to a much lesser degree in this case.   I will check labs for secondary causes of neuropathy and start nortriptyline for pain relief since he does not tolerate Lyrica or gabapentin.    PLAN/RECOMMENDATIONS:  1.  Check ESR, vitamin B12, copper, folate, vitamin B1, SPEP with IFE 2.  NCS/EMG of the lower extremities 3.  Start nortriptyline 33m at bedtime 4.  Stop Lyrica  5.  Start physical therapy for gait training.  Recommend using cane.  6.  Fall precautions discussed and literature provided.   Return to clinic in 3 months.   The duration of this appointment visit was 60 minutes of face-to-face time with the patient.  Greater than 50% of this time was spent in counseling, explanation of diagnosis, planning of further management, and coordination of care.   Thank you for allowing me to participate in patient's care.  If I can answer any additional questions, I would be pleased to do so.    Sincerely,    Gerald Williams K. PPosey Pronto DO

## 2015-09-25 LAB — PROTEIN ELECTROPHORESIS, SERUM
ALBUMIN ELP: 4.2 g/dL (ref 3.8–4.8)
ALPHA-1-GLOBULIN: 0.3 g/dL (ref 0.2–0.3)
ALPHA-2-GLOBULIN: 0.6 g/dL (ref 0.5–0.9)
BETA GLOBULIN: 0.5 g/dL (ref 0.4–0.6)
Beta 2: 0.5 g/dL (ref 0.2–0.5)
Gamma Globulin: 1.4 g/dL (ref 0.8–1.7)
Total Protein, Serum Electrophoresis: 7.4 g/dL (ref 6.1–8.1)

## 2015-09-25 LAB — IMMUNOFIXATION ELECTROPHORESIS
IGA: 256 mg/dL (ref 81–463)
IGM, SERUM: 192 mg/dL (ref 48–271)
IgG (Immunoglobin G), Serum: 1490 mg/dL (ref 694–1618)

## 2015-09-25 LAB — COPPER, SERUM: COPPER: 134 ug/dL (ref 72–166)

## 2015-09-27 LAB — VITAMIN B1: Vitamin B1 (Thiamine): <7 nmol/L — ABNORMAL LOW (ref 8–30)

## 2015-09-28 ENCOUNTER — Ambulatory Visit (INDEPENDENT_AMBULATORY_CARE_PROVIDER_SITE_OTHER): Payer: Medicare Other

## 2015-09-28 VITALS — BP 138/90 | Ht 65.5 in | Wt 167.4 lb

## 2015-09-28 DIAGNOSIS — Z0000 Encounter for general adult medical examination without abnormal findings: Secondary | ICD-10-CM

## 2015-09-28 DIAGNOSIS — H9193 Unspecified hearing loss, bilateral: Secondary | ICD-10-CM

## 2015-09-28 NOTE — Progress Notes (Addendum)
Subjective:   Gerald Williams is a 65 y.o. male who presents for Medicare Annual/Subsequent preventive examination.  Review of Systems:  HRA assessment completed during visit; Samar, Dass  The Patient was informed that this wellness visit is to identify risk and educate on how to reduce risk for increase disease through lifestyle changes.   ROS deferred to CPE exam with physician completed recently Family and medical hx given below;    Lifestyle review:    Psychosocial; lives alone but has a friend   Tobacco: Did not smoke until  [place]; discussed LDCT and pulmonologist is following at [place] ; States he had no smoking hx or ETOH use until his tour in [place]. He was exposed to agent orange per his admission.   Medication review/ Adherent; Tries to be very compliant with the tx plan Dr. Quay Burow gave him lyrica; [facility] tried  cymb[facility];  Neurologist dx Alcoholic neuropathy but he feels this is not correct.  States neuropathy is very painful now. Fallen x 1; now uses a cane.   States he is going to have nerve testing in the future at the [place]   BMI: 27.8   Diet; Trying to lose weight;  Copd inhibits eating;  Diabetic diet/ a1c is 6.2; trying to work on this [employer] to dietician and stays compliant with limiting his sugar intake;   Educated COPD and eating small meals;  Adjusted to not working to hard in hot weather; Can do more exercise in the fall and spring     Exercise; peripheral  neuropathy 1-10 / pain is an 8; using cane for support; fear of falling as he states his leg just "gave out" on him.  Has a cane now to assist with balance and confidence    HOME SAFETY; one level  2 falls this year; did knock out a tooth; secondary to neuropathy  ADL's; no issues; house is set up for handicapped  Removal of clutter clearing paths through the home is in process.   Community safety; YES Smoke detectors YES Firearms safety reviewed and will keep in a safe place if these exist. Driving  accidents; no Advised to use sun protection or large brim hat/ if he is out  Stressors (1-5) Lots of stressed; seeing psych at the [place]; Would like one in the [facility] system. Stated it would be best to have a fup apt with Dr. Quay Burow to discuss.   Depression: Overwhelmed currently with COPD and neuropathy; Has a visit with Psych today; States he is not Suicidal but understands his mental health condition and will defer depression screen at the patient's request in lieu of psych apt this afternoon.   Goal; to get his neuropathy and PTSD under control  Cognitive; Memory issues;  Manages checkbook, medications; no failures of task Ad8 score reviewed for issues;  Issues making decisions; no  Less interest in hobbies / activities" no  Repeats questions, stories; family complaining: NO  Trouble using ordinary gadgets; microwave; computer: no  Forgets the month or year: no  Mismanaging finances: no  Missing apt: no but does write them down  Daily problems with thinking of memory NO Ad8 score is 0    Mobilization and Functional losses from last year to this year? no  Sleep pattern changes; not good; the neuropathy keeps him up   Uses Cpap machine and is compliant   Urinary or fecal incontinence reviewed: states he has urinary frequency.   Acutally will sees [place] on Wed for hx of hoarseness.  Voice has changed since throat surgery in Jan and in process of following up with MD     Counseling Health Maintenance Hep c per [facility] and will bring [facility] blood work and Immunization record  HIV neg per the [facility]  Colonoscopy; has 2 years before one is due;  [facility]; due approx 2019; To get records   EKG: 05/2015 Hearing: Hearing is < 1000hz in both ears; Will refer to audiology but may be cost effective to pursue hearing aids through the [facility]   Ophthalmology exam/ due next year and has new glasses now. Next apt 05/2016 has cataracts they are monitoring   Immunizations Due: Will bring  a copy of last TD  Advanced Directive; yes; will bring copy for chart  Health Recommendations and Referrals Audiology  Possibly psych but will fup with Dr. Burns  Barriers to Success Anxiety and PTSD    Current Care Team reviewed and updated    Cardiac Risk Factors include: advanced age (>55men, >65 women);diabetes mellitus;dyslipidemia;hypertension;male gender;sedentary lifestyle     Objective:    Vitals: BP 138/90 mmHg  Ht 5' 5.5" (1.664 m)  Wt 167 lb 6 oz (75.921 kg)  BMI 27.42 kg/m2  Body mass index is 27.42 kg/(m^2).  Tobacco History  Smoking status  . Former Smoker -- 1.50 packs/day for 50 years  . Types: Cigarettes  . Start date: 04/26/1963  . Quit date: 09/22/2013  Smokeless tobacco  . Never Used    Comment: patient using the patch, losenges, and medication     Counseling given: Not Answered   Past Medical History  Diagnosis Date  . Emphysema   . Polysubstance abuse     stopped using crack/cocaine December 4th 20-14  . Sleep apnea   . Hyperlipemia   . Erectile dysfunction    Past Surgical History  Procedure Laterality Date  . Left heart catheterization with coronary angiogram N/A 07/15/2014    Procedure: LEFT HEART CATHETERIZATION WITH CORONARY ANGIOGRAM;  Surgeon: Thomas A Kelly, MD;  Location: [facility];  Service: Cardiovascular;  Laterality: N/A;   Family History  Problem Relation Age of Onset  . Diabetes Mother     Deceased  . Heart disease Father     Deceased  . Heart attack Father     x5  . Cancer - Other Sister   . Healthy Son   . Healthy Daughter   . Heart attack Brother     Deceased  . Alcohol abuse Brother     Deceased   History  Sexual Activity  . Sexual Activity: Not on file    Outpatient Encounter Prescriptions as of 09/28/2015  Medication Sig  . albuterol (PROVENTIL HFA;VENTOLIN HFA) 108 (90 BASE) MCG/ACT inhaler Inhale 2 puffs into the lungs every 6 (six) hours as needed for shortness of breath.  . albuterol  (PROVENTIL) (2.5 MG/3ML) 0.083% nebulizer solution Take 2.5 mg by nebulization 3 (three) times daily.   . albuterol-ipratropium (COMBIVENT) 18-103 MCG/ACT inhaler Inhale 1 puff into the lungs every 6 (six) hours as needed for wheezing.  . aspirin 81 MG tablet Take 81 mg by mouth daily.  . atorvastatin (LIPITOR) 80 MG tablet Take 80 mg by mouth at bedtime.  . budesonide-formoterol (SYMBICORT) 160-4.5 MCG/ACT inhaler Inhale 2 puffs into the lungs 2 (two) times daily.  . cyclobenzaprine (FLEXERIL) 10 MG tablet Take 10 mg by mouth 3 (three) times daily as needed for muscle spasms.  . ipratropium (ATROVENT) 0.02 % nebulizer solution   Take 0.5 mg by nebulization 3 (three) times daily.  . MIRTAZAPINE PO Take by mouth at bedtime.  . NON FORMULARY at bedtime. [facility]  . nortriptyline (PAMELOR) 10 MG capsule Take 1 capsule (10 mg total) by mouth at bedtime.  . sildenafil (VIAGRA) 100 MG tablet Take 100 mg by mouth daily as needed for erectile dysfunction.  . Tiotropium Bromide Monohydrate (SPIRIVA RESPIMAT IN) Inhale 2.5 Inhalers into the lungs 2 (two) times daily.  . valsartan (DIOVAN) 80 MG tablet Take 1 tablet (80 mg total) by mouth daily.  . umeclidinium bromide (INCRUSE ELLIPTA) 62.5 MCG/INH AEPB Inhale 1 puff into the lungs daily. (Patient not taking: Reported on 09/28/2015)   No facility-administered encounter medications on file as of 09/28/2015.    Activities of Daily Living In your present state of health, do you have any difficulty performing the following activities: 09/28/2015  Hearing? Y  Vision? N  Difficulty concentrating or making decisions? N  Walking or climbing stairs? Y  Dressing or bathing? N  Doing errands, shopping? N  Preparing Food and eating ? N  Using the Toilet? N  In the past six months, have you accidently leaked urine? N  Do you have problems with loss of bowel control? N  Managing your Medications? N  Managing your Finances? Y  Housekeeping or  managing your Housekeeping? N    Patient Care Team: Stacy J Burns, MD as PCP - General (Internal Medicine)   Assessment:     Exercise Activities and Dietary recommendations Current Exercise Habits: The patient does not participate in regular exercise at present, Intensity: Moderate  Goals    . patient     To get neuropathy under control;  Keep up with his [facility] resources;       Fall Risk Fall Risk  09/28/2015 09/23/2015 12/23/2013  Falls in the past year? - Yes No  Number falls in past yr: 2 or more 2 or more -  Injury with Fall? Yes Yes -  Risk Factor Category  - High Fall Risk -  Risk for fall due to : - Impaired balance/gait -  Follow up Education provided Falls evaluation completed;Education provided;Falls prevention discussed -   Depression Screen PHQ 2/9 Scores 04/10/2014 12/23/2013  PHQ - 2 Score 0 0    Cognitive Testing No flowsheet data found.  AD8 score 0; denies issues; Appropriate engagement, affect and responses at the assessment today.  Immunization History  Administered Date(s) Administered  . Influenza-Unspecified 01/03/2015   Screening Tests Health Maintenance  Topic Date Due  . Hepatitis C Screening  01/04/1951  . HIV Screening  01/05/1967  . TETANUS/TDAP  01/05/1971  . COLONOSCOPY  01/04/2002  . ZOSTAVAX  01/05/2012  . INFLUENZA VACCINE  11/24/2015      Plan:     Referral to audiology  To see DR. Burns for fup if he would like a Psych referral.  During the course of the visit the patient was educated and counseled about the following appropriate screening and preventive services:   Vaccines to include Pneumoccal, Influenza, Hepatitis B, Td, Zostavax, HCV  Will get hx of vaccination from the [facility]  Electrocardiogram / no new issues verbalized  Cardiovascular Disease/ BP elevated but came down after talking   Colorectal cancer screening; States he is due in 2 years; 2019; will get information from the [facility] to confirm  Diabetes screening/  Monitoring Carbs; Exercise is difficult due to Peripheral neuropathy  Prostate Cancer Screening/ deferred     Glaucoma screening; neg per the patient; followed by [place]  Nutrition counseling / met with dietician from [place]  Smoking cessation counseling; no smoking currently   Patient Instructions (the written plan) was given to the patient.    [id number],[email or web address], RN  09/29/2015   Medical screening examination/treatment/procedure(s) were performed by non-physician practitioner and as supervising physician I was immediately available for consultation/collaboration. I agree with above. Binnie Rail, MD

## 2015-09-28 NOTE — Patient Instructions (Addendum)
Gerald Williams , Thank you for taking time to come for your Medicare Wellness Visit. I appreciate your ongoing commitment to your health goals. Please review the following plan we discussed and let me know if I can assist you in the future.   May need referral to audiology and will request from Dr. Lawerance Bach  Would like a Psych doctor outside of [place]; May need a 15 minutes fup visit       These are the goals we discussed: Goals    . patient     To get neuropathy under control;  Keep up with his [facility] resources;        This is a list of the screening recommended for you and due dates:  Health Maintenance  Topic Date Due  .  Hepatitis C: One time screening is recommended by Center for Disease Control  (CDC) for  adults born from [birth year] through 1964.   [birth year]  . HIV Screening  01/05/1967  . Tetanus Vaccine  01/05/1971  . Colon Cancer Screening  01/04/2002  . Shingles Vaccine  01/05/2012  . Flu Shot  11/24/2015   You can visit [email or web address]  Diabetes and Foot Care Diabetes may cause you to have problems because of poor blood supply (circulation) to your feet and legs. This may cause the skin on your feet to become thinner, break easier, and heal more slowly. Your skin may become dry, and the skin may peel and crack. You may also have nerve damage in your legs and feet causing decreased feeling in them. You may not notice minor injuries to your feet that could lead to infections or more serious problems. Taking care of your feet is one of the most important things you can do for yourself.  HOME CARE INSTRUCTIONS  Wear shoes at all times, even in the house. Do not go barefoot. Bare feet are easily injured.  Check your feet daily for blisters, cuts, and redness. If you cannot see the bottom of your feet, use a mirror or ask someone for help.  Wash your feet with warm water (do not use hot water) and mild soap. Then pat your feet and the areas between your toes until they are completely dry. Do  not soak your feet as this can dry your skin.  Apply a moisturizing lotion or petroleum jelly (that does not contain alcohol and is unscented) to the skin on your feet and to dry, brittle toenails. Do not apply lotion between your toes.  Trim your toenails straight across. Do not dig under them or around the cuticle. File the edges of your nails with an emery board or nail file.  Do not cut corns or calluses or try to remove them with medicine.  Wear clean socks or stockings every day. Make sure they are not too tight. Do not wear knee-high stockings since they may decrease blood flow to your legs.  Wear shoes that fit properly and have enough cushioning. To break in new shoes, wear them for just a few hours a day. This prevents you from injuring your feet. Always look in your shoes before you put them on to be sure there are no objects inside.  Do not cross your legs. This may decrease the blood flow to your feet.  If you find a minor scrape, cut, or break in the skin on your feet, keep it and the skin around it clean and dry. These areas may be cleansed with mild soap and water. Do not  cleanse the area with peroxide, alcohol, or iodine.  When you remove an adhesive bandage, be sure not to damage the skin around it.  If you have a wound, look at it several times a day to make sure it is healing.  Do not use heating pads or hot water bottles. They may burn your skin. If you have lost feeling in your feet or legs, you may not know it is happening until it is too late.  Make sure your health care provider performs a complete foot exam at least annually or more often if you have foot problems. Report any cuts, sores, or bruises to your health care provider immediately. SEEK MEDICAL CARE IF:   You have an injury that is not healing.  You have cuts or breaks in the skin.  You have an ingrown nail.  You notice redness on your legs or feet.  You feel burning or tingling in your legs or  feet.  You have pain or cramps in your legs and feet.  Your legs or feet are numb.  Your feet always feel cold. SEEK IMMEDIATE MEDICAL CARE IF:   There is increasing redness, swelling, or pain in or around a wound.  There is a red line that goes up your leg.  Pus is coming from a wound.  You develop a fever or as directed by your health care provider.  You notice a bad smell coming from an ulcer or wound.   This information is not intended to replace advice given to you by your health care provider. Make sure you discuss any questions you have with your health care provider.   Document Released: 04/08/2000 Document Revised: 12/12/2012 Document Reviewed: 09/18/2012 Elsevier Interactive Patient Education 2016 [employer].  Fall Prevention in the Home  Falls can cause injuries. They can happen to people of all ages. There are many things you can do to make your home safe and to help prevent falls.  WHAT CAN I DO ON THE OUTSIDE OF MY HOME?  Regularly fix the edges of walkways and driveways and fix any cracks.  Remove anything that might make you trip as you walk through a door, such as a raised step or threshold.  Trim any bushes or trees on the path to your home.  Use bright outdoor lighting.  Clear any walking paths of anything that might make someone trip, such as rocks or tools.  Regularly check to see if handrails are loose or broken. Make sure that both sides of any steps have handrails.  Any raised decks and porches should have guardrails on the edges.  Have any leaves, snow, or ice cleared regularly.  Use sand or salt on walking paths during winter.  Clean up any spills in your garage right away. This includes oil or grease spills. WHAT CAN I DO IN THE BATHROOM?   Use night lights.  Install grab bars by the toilet and in the tub and shower. Do not use towel bars as grab bars.  Use non-skid mats or decals in the tub or shower.  If you need to sit down in  the shower, use a plastic, non-slip stool.  Keep the floor dry. Clean up any water that spills on the floor as soon as it happens.  Remove soap buildup in the tub or shower regularly.  Attach bath mats securely with double-sided non-slip rug tape.  Do not have throw rugs and other things on the floor that can make you trip. WHAT  CAN I DO IN THE BEDROOM?  Use night lights.  Make sure that you have a light by your bed that is easy to reach.  Do not use any sheets or blankets that are too big for your bed. They should not hang down onto the floor.  Have a firm chair that has side arms. You can use this for support while you get dressed.  Do not have throw rugs and other things on the floor that can make you trip. WHAT CAN I DO IN THE KITCHEN?  Clean up any spills right away.  Avoid walking on wet floors.  Keep items that you use a lot in easy-to-reach places.  If you need to reach something above you, use a strong step stool that has a grab bar.  Keep electrical cords out of the way.  Do not use floor polish or wax that makes floors slippery. If you must use wax, use non-skid floor wax.  Do not have throw rugs and other things on the floor that can make you trip. WHAT CAN I DO WITH MY STAIRS?  Do not leave any items on the stairs.  Make sure that there are handrails on both sides of the stairs and use them. Fix handrails that are broken or loose. Make sure that handrails are as long as the stairways.  Check any carpeting to make sure that it is firmly attached to the stairs. Fix any carpet that is loose or worn.  Avoid having throw rugs at the top or bottom of the stairs. If you do have throw rugs, attach them to the floor with carpet tape.  Make sure that you have a light switch at the top of the stairs and the bottom of the stairs. If you do not have them, ask someone to add them for you. WHAT ELSE CAN I DO TO HELP PREVENT FALLS?  Wear shoes that:  Do not have high  heels.  Have rubber bottoms.  Are comfortable and fit you well.  Are closed at the toe. Do not wear sandals.  If you use a stepladder:  Make sure that it is fully opened. Do not climb a closed stepladder.  Make sure that both sides of the stepladder are locked into place.  Ask someone to hold it for you, if possible.  Clearly mark and make sure that you can see:  Any grab bars or handrails.  First and last steps.  Where the edge of each step is.  Use tools that help you move around (mobility aids) if they are needed. These include:  Canes.  Walkers.  Scooters.  Crutches.  Turn on the lights when you go into a dark area. Replace any light bulbs as soon as they burn out.  Set up your furniture so you have a clear path. Avoid moving your furniture around.  If any of your floors are uneven, fix them.  If there are any pets around you, be aware of where they are.  Review your medicines with your doctor. Some medicines can make you feel dizzy. This can increase your chance of falling. Ask your doctor what other things that you can do to help prevent falls.   This information is not intended to replace advice given to you by your health care provider. Make sure you discuss any questions you have with your health care provider.   Document Released: 02/05/2009 Document Revised: 08/26/2014 Document Reviewed: 05/16/2014 Elsevier Interactive Patient Education 2016 [employer].  Health Maintenance,  Male A healthy lifestyle and preventative care can promote health and wellness.  Maintain regular health, dental, and eye exams.  Eat a healthy diet. Foods like vegetables, fruits, whole grains, low-fat dairy products, and lean protein foods contain the nutrients you need and are low in calories. Decrease your intake of foods high in solid fats, added sugars, and salt. Get information about a proper diet from your health care provider, if necessary.  Regular physical exercise  is one of the most important things you can do for your health. Most adults should get at least 150 minutes of moderate-intensity exercise (any activity that increases your heart rate and causes you to sweat) each week. In addition, most adults need muscle-strengthening exercises on 2 or more days a week.   Maintain a healthy weight. The body mass index (BMI) is a screening tool to identify possible weight problems. It provides an estimate of body fat based on height and weight. Your health care provider can find your BMI and can help you achieve or maintain a healthy weight. For males 20 years and older:  A BMI below 18.5 is considered underweight.  A BMI of 18.5 to 24.9 is normal.  A BMI of 25 to 29.9 is considered overweight.  A BMI of 30 and above is considered obese.  Maintain normal blood lipids and cholesterol by exercising and minimizing your intake of saturated fat. Eat a balanced diet with plenty of fruits and vegetables. Blood tests for lipids and cholesterol should begin at age 64 and be repeated every 5 years. If your lipid or cholesterol levels are high, you are over age 1, or you are at high risk for heart disease, you may need your cholesterol levels checked more frequently.Ongoing high lipid and cholesterol levels should be treated with medicines if diet and exercise are not working.  If you smoke, find out from your health care provider how to quit. If you do not use tobacco, do not start.  Lung cancer screening is recommended for adults aged 55-80 years who are at high risk for developing lung cancer because of a history of smoking. A yearly low-dose CT scan of the lungs is recommended for people who have at least a 30-pack-year history of smoking and are current smokers or have quit within the past 15 years. A pack year of smoking is smoking an average of 1 pack of cigarettes a day for 1 year (for example, a 30-pack-year history of smoking could mean smoking 1 pack a day for 30  years or 2 packs a day for 15 years). Yearly screening should continue until the smoker has stopped smoking for at least 15 years. Yearly screening should be stopped for people who develop a health problem that would prevent them from having lung cancer treatment.  If you choose to drink alcohol, do not have more than 2 drinks per day. One drink is considered to be 12 oz (360 mL) of beer, 5 oz (150 mL) of wine, or 1.5 oz (45 mL) of liquor.  Avoid the use of street drugs. Do not share needles with anyone. Ask for help if you need support or instructions about stopping the use of drugs.  High blood pressure causes heart disease and increases the risk of stroke. High blood pressure is more likely to develop in:  People who have blood pressure in the end of the normal range (100-139/85-89 mm Hg).  People who are overweight or obese.  People who are African American.  If  you are 65-50 years of age, have your blood pressure checked every 3-5 years. If you are 1 years of age or older, have your blood pressure checked every year. You should have your blood pressure measured twice--once when you are at a hospital or clinic, and once when you are not at a hospital or clinic. Record the average of the two measurements. To check your blood pressure when you are not at a hospital or clinic, you can use:  An automated blood pressure machine at a pharmacy.  A home blood pressure monitor.  If you are 35-18 years old, ask your health care provider if you should take aspirin to prevent heart disease.  Diabetes screening involves taking a blood sample to check your fasting blood sugar level. This should be done once every 3 years after age 25 if you are at a normal weight and without risk factors for diabetes. Testing should be considered at a younger age or be carried out more frequently if you are overweight and have at least 1 risk factor for diabetes.  Colorectal cancer can be detected and often prevented.  Most routine colorectal cancer screening begins at the age of 43 and continues through age 61. However, your health care provider may recommend screening at an earlier age if you have risk factors for colon cancer. On a yearly basis, your health care provider may provide home test kits to check for hidden blood in the stool. A small camera at the end of a tube may be used to directly examine the colon (sigmoidoscopy or colonoscopy) to detect the earliest forms of colorectal cancer. Talk to your health care provider about this at age 83 when routine screening begins. A direct exam of the colon should be repeated every 5-10 years through age 1, unless early forms of precancerous polyps or small growths are found.  People who are at an increased risk for hepatitis B should be screened for this virus. You are considered at high risk for hepatitis B if:  You were born in a country where hepatitis B occurs often. Talk with your health care provider about which countries are considered high risk.  Your parents were born in a high-risk country and you have not received a shot to protect against hepatitis B (hepatitis B vaccine).  You have HIV or AIDS.  You use needles to inject street drugs.  You live with, or have sex with, someone who has hepatitis B.  You are a man who has sex with other men (MSM).  You get hemodialysis treatment.  You take certain medicines for conditions like cancer, organ transplantation, and autoimmune conditions.  Hepatitis C blood testing is recommended for all people born from [birth year] through 1964 and any individual with known risk factors for hepatitis C.  Healthy men should no longer receive prostate-specific antigen (PSA) blood tests as part of routine cancer screening. Talk to your health care provider about prostate cancer screening.  Testicular cancer screening is not recommended for adolescents or adult males who have no symptoms. Screening includes self-exam, a health  care provider exam, and other screening tests. Consult with your health care provider about any symptoms you have or any concerns you have about testicular cancer.  Practice safe sex. Use condoms and avoid high-risk sexual practices to reduce the spread of sexually transmitted infections (STIs).  You should be screened for STIs, including gonorrhea and chlamydia if:  You are sexually active and are younger than 24 years.  You are  older than 24 years, and your health care provider tells you that you are at risk for this type of infection.  Your sexual activity has changed since you were last screened, and you are at an increased risk for chlamydia or gonorrhea. Ask your health care provider if you are at risk.  If you are at risk of being infected with HIV, it is recommended that you take a prescription medicine daily to prevent HIV infection. This is called pre-exposure prophylaxis (PrEP). You are considered at risk if:  You are a man who has sex with other men (MSM).  You are a heterosexual man who is sexually active with multiple partners.  You take drugs by injection.  You are sexually active with a partner who has HIV.  Talk with your health care provider about whether you are at high risk of being infected with HIV. If you choose to begin PrEP, you should first be tested for HIV. You should then be tested every 3 months for as long as you are taking PrEP.  Use sunscreen. Apply sunscreen liberally and repeatedly throughout the day. You should seek shade when your shadow is shorter than you. Protect yourself by wearing long sleeves, pants, a wide-brimmed hat, and sunglasses year round whenever you are outdoors.  Tell your health care provider of new moles or changes in moles, especially if there is a change in shape or color. Also, tell your health care provider if a mole is larger than the size of a pencil eraser.  A one-time screening for abdominal aortic aneurysm (AAA) and surgical  repair of large AAAs by ultrasound is recommended for men aged 65-75 years who are current or former smokers.  Stay current with your vaccines (immunizations).   This information is not intended to replace advice given to you by your health care provider. Make sure you discuss any questions you have with your health care provider.   Document Released: 10/08/2007 Document Revised: 05/02/2014 Document Reviewed: 09/06/2010 Elsevier Interactive Patient Education 2016 [employer].  Hearing Loss Hearing loss is a partial or total loss of the ability to hear. This can be temporary or permanent, and it can happen in one or both ears. Hearing loss may be referred to as deafness. Medical care is necessary to treat hearing loss properly and to prevent the condition from getting worse. Your hearing may partially or completely come back, depending on what caused your hearing loss and how severe it is. In some cases, hearing loss is permanent. CAUSES Common causes of hearing loss include:   Too much wax in the ear canal.   Infection of the ear canal or middle ear.   Fluid in the middle ear.   Injury to the ear or surrounding area.   An object stuck in the ear.   Prolonged exposure to loud sounds, such as music.  Less common causes of hearing loss include:   Tumors in the ear.   Viral or bacterial infections, such as meningitis.   A hole in the eardrum (perforated eardrum).  Problems with the hearing nerve that sends signals between the brain and the ear.  Certain medicines.  SYMPTOMS  Symptoms of this condition may include:  Difficulty telling the difference between sounds.  Difficulty following a conversation when there is background noise.  Lack of response to sounds in your environment. This may be most noticeable when you do not respond to startling sounds.  Needing to turn up the volume on the television, radio,  etc.  Ringing in the ears.  Dizziness.  Pain in the  ears. DIAGNOSIS This condition is diagnosed based on a physical exam and a hearing test (audiometry). The audiometry test will be performed by a hearing specialist (audiologist). You may also be referred to an ear, nose, and throat (ENT) specialist (otolaryngologist).  TREATMENT Treatment for recent onset of hearing loss may include:   Ear wax removal.   Being prescribed medicines to prevent infection (antibiotics).   Being prescribed medicines to reduce inflammation (corticosteroids).  HOME CARE INSTRUCTIONS  If you were prescribed an antibiotic medicine, take it as told by your health care provider. Do not stop taking the antibiotic even if you start to feel better.  Take over-the-counter and prescription medicines only as told by your health care provider.  Avoid loud noises.   Return to your normal activities as told by your health care provider. Ask your health care provider what activities are safe for you.  Keep all follow-up visits as told by your health care provider. This is important. SEEK MEDICAL CARE IF:   You feel dizzy.   You develop new symptoms.   You vomit or feel nauseous.   You have a fever.  SEEK IMMEDIATE MEDICAL CARE IF:  You develop sudden changes in your vision.   You have severe ear pain.   You have new or increased weakness.  You have a severe headache.   This information is not intended to replace advice given to you by your health care provider. Make sure you discuss any questions you have with your health care provider.   Document Released: 04/11/2005 Document Revised: 12/31/2014 Document Reviewed: 08/27/2014 Elsevier Interactive Patient Education [employer]

## 2015-10-05 ENCOUNTER — Other Ambulatory Visit (INDEPENDENT_AMBULATORY_CARE_PROVIDER_SITE_OTHER): Payer: Medicare Other

## 2015-10-05 ENCOUNTER — Ambulatory Visit (INDEPENDENT_AMBULATORY_CARE_PROVIDER_SITE_OTHER): Payer: Medicare Other | Admitting: Internal Medicine

## 2015-10-05 ENCOUNTER — Encounter: Payer: Self-pay | Admitting: Internal Medicine

## 2015-10-05 VITALS — BP 160/106 | HR 63 | Temp 98.0°F | Resp 16 | Wt 168.0 lb

## 2015-10-05 DIAGNOSIS — R7303 Prediabetes: Secondary | ICD-10-CM

## 2015-10-05 DIAGNOSIS — K921 Melena: Secondary | ICD-10-CM | POA: Diagnosis not present

## 2015-10-05 DIAGNOSIS — I10 Essential (primary) hypertension: Secondary | ICD-10-CM

## 2015-10-05 LAB — COMPREHENSIVE METABOLIC PANEL
ALBUMIN: 4.2 g/dL (ref 3.5–5.2)
ALT: 14 U/L (ref 0–53)
AST: 16 U/L (ref 0–37)
Alkaline Phosphatase: 70 U/L (ref 39–117)
BUN: 15 mg/dL (ref 6–23)
CALCIUM: 9.3 mg/dL (ref 8.4–10.5)
CHLORIDE: 107 meq/L (ref 96–112)
CO2: 28 meq/L (ref 19–32)
Creatinine, Ser: 1.22 mg/dL (ref 0.40–1.50)
GFR: 76.97 mL/min (ref >60.00)
Glucose, Bld: 99 mg/dL (ref 70–99)
POTASSIUM: 3.9 meq/L (ref 3.5–5.1)
Sodium: 142 mEq/L (ref 135–145)
Total Bilirubin: 0.8 mg/dL (ref 0.2–1.2)
Total Protein: 7.5 g/dL (ref 6.0–8.3)

## 2015-10-05 LAB — CBC WITH DIFFERENTIAL/PLATELET
BASOS ABS: 0 10*3/uL (ref 0.0–0.1)
BASOS PCT: 0.6 % (ref 0.0–3.0)
Eosinophils Absolute: 0.1 10*3/uL (ref 0.0–0.7)
Eosinophils Relative: 3.1 % (ref 0.0–5.0)
HCT: 40.6 % (ref 39.0–52.0)
Hemoglobin: 13.6 g/dL (ref 13.0–17.0)
LYMPHS ABS: 1.5 10*3/uL (ref 0.7–4.0)
Lymphocytes Relative: 32.1 % (ref 12.0–46.0)
MCHC: 33.4 g/dL (ref 30.0–36.0)
MCV: 89.8 fl (ref 78.0–100.0)
MONOS PCT: 8.4 % (ref 3.0–12.0)
Monocytes Absolute: 0.4 10*3/uL (ref 0.1–1.0)
NEUTROS ABS: 2.5 10*3/uL (ref 1.4–7.7)
NEUTROS PCT: 55.8 % (ref 43.0–77.0)
PLATELETS: 229 10*3/uL (ref 150.0–400.0)
RBC: 4.53 Mil/uL (ref 4.22–5.81)
RDW: 14.5 % (ref 11.5–15.5)
WBC: 4.5 10*3/uL (ref 4.0–10.5)

## 2015-10-05 LAB — HEMOGLOBIN A1C: Hgb A1c MFr Bld: 6.3 % (ref 4.6–6.5)

## 2015-10-05 NOTE — Assessment & Plan Note (Signed)
Black and red blood, no other concerning symptoms Check cbc, cmp Will refer to GI for possible colonoscopy - last scope was 4 years ago at the [place] - had polyps He will call if his symptoms worsen or continue prior to seeing GI

## 2015-10-05 NOTE — Patient Instructions (Addendum)
  Test(s) ordered today. Your results will be released to MyChart (or called to you) after review, usually within 72hours after test completion. If any changes need to be made, you will be notified at that same time.    Medications reviewed and updated.  No changes recommended at this time.    A referral was ordered for GI for a possible colonoscopy.

## 2015-10-05 NOTE — Assessment & Plan Note (Signed)
Check a1c 

## 2015-10-05 NOTE — Progress Notes (Signed)
Subjective:    Patient ID: Gerald Williams, male    DOB: 20-Dec-1950, 65 y.o.   MRN: [id number]  HPI  He is here for an acute visit.  Blood in stool:  He started having blood in the stool and on the tissue 6 days ago.  When it first started it was black in the stool.  He did not have any constipation or diarrhea before the bleeding started.  He denies abdominal pain, nausea, gerd, fever. He has been more fatigued. He has a bowel movement once a day in the morning and his stools are normal. His stool this morning was watery for the first time.  He takes ASA daily, but no other nsaids.  His last colonoscopy was at age 65 - "4 polyps, not cancerous".  It was done at the [place].  He would like to have future colonoscopies if needed here.   Hypertension: He is taking his medication daily. He is compliant with a low sodium diet.   He is not exercising regularly.  He does monitor his blood pressure at home, 140-150/?.  He states it typically goes down if he rechecks it and he feels it is well controlled.   Prediabetes:  He is compliant with a low sugar/carbohydrate diet.  He is not exercising regularly.    Medications and allergies reviewed with patient and updated if appropriate.  Patient Active Problem List   Diagnosis Date Noted  . Essential hypertension 06/18/2015  . Cephalalgia 06/18/2015  . Dyslipidemia 06/12/2015  . COPD mixed type (HCC) 05/28/2015  . Peripheral neuropathy (HCC) 05/20/2015  . COPD (chronic obstructive pulmonary disease) with emphysema (HCC) 05/20/2015  . PTSD (post-traumatic stress disorder) 05/20/2015  . Right carotid bruit 05/20/2015  . Frequent urination 05/20/2015  . Hematuria 07/15/2014  . History of tobacco abuse:  quit in ~ 06/2013  07/14/2014  . OSA on CPAP 07/14/2014  . Pre-diabetes: last A1C 6.2 (07/01/14) 07/14/2014  . Claudication, R>L 07/14/2014  . Cocaine use, history of 03/26/2011  . Chest pain 03/26/2011    Current Outpatient Prescriptions on File  Prior to Visit  Medication Sig Dispense Refill  . albuterol (PROVENTIL HFA;VENTOLIN HFA) 108 (90 BASE) MCG/ACT inhaler Inhale 2 puffs into the lungs every 6 (six) hours as needed for shortness of breath.    Marland Kitchen albuterol (PROVENTIL) (2.5 MG/3ML) 0.083% nebulizer solution Take 2.5 mg by nebulization 3 (three) times daily.     Marland Kitchen albuterol-ipratropium (COMBIVENT) 18-103 MCG/ACT inhaler Inhale 1 puff into the lungs every 6 (six) hours as needed for wheezing. 1 Inhaler 5  . aspirin 81 MG tablet Take 81 mg by mouth daily.    Marland Kitchen atorvastatin (LIPITOR) 80 MG tablet Take 80 mg by mouth at bedtime.    . budesonide-formoterol (SYMBICORT) 160-4.5 MCG/ACT inhaler Inhale 2 puffs into the lungs 2 (two) times daily. 1 Inhaler 5  . cyclobenzaprine (FLEXERIL) 10 MG tablet Take 10 mg by mouth 3 (three) times daily as needed for muscle spasms.    Marland Kitchen ipratropium (ATROVENT) 0.02 % nebulizer solution Take 0.5 mg by nebulization 3 (three) times daily.    Marland Kitchen MIRTAZAPINE PO Take by mouth at bedtime.    . NON FORMULARY at bedtime. CPAP w/oxygen - followed at [facility]    . nortriptyline (PAMELOR) 10 MG capsule Take 1 capsule (10 mg total) by mouth at bedtime. 30 capsule 5  . sildenafil (VIAGRA) 100 MG tablet Take 100 mg by mouth daily as needed for erectile dysfunction.    Marland Kitchen  Tiotropium Bromide Monohydrate (SPIRIVA RESPIMAT IN) Inhale 2.5 Inhalers into the lungs 2 (two) times daily.    Marland Kitchen umeclidinium bromide (INCRUSE ELLIPTA) 62.5 MCG/INH AEPB Inhale 1 puff into the lungs daily. 30 each 11  . valsartan (DIOVAN) 80 MG tablet Take 1 tablet (80 mg total) by mouth daily. 30 tablet 3   No current facility-administered medications on file prior to visit.    Past Medical History  Diagnosis Date  . Emphysema   . Polysubstance abuse     stopped using crack/cocaine December 4th 20-14  . Sleep apnea   . Hyperlipemia   . Erectile dysfunction     Past Surgical History  Procedure Laterality Date  . Left heart catheterization with  coronary angiogram N/A 07/15/2014    Procedure: LEFT HEART CATHETERIZATION WITH CORONARY ANGIOGRAM;  Surgeon: Lennette Bihari, MD;  Location: [facility] CATH LAB;  Service: Cardiovascular;  Laterality: N/A;    Social History   Social History  . Marital Status: Divorced    Spouse Name: N/A  . Number of Children: 4  . Years of Education: N/A   Occupational History  . chef - retired    Social History Main Topics  . Smoking status: Former Smoker -- 1.50 packs/day for 50 years    Types: Cigarettes    Start date: 04/26/1963    Quit date: 09/22/2013  . Smokeless tobacco: Never Used     Comment: patient using the patch, losenges, and medication  . Alcohol Use: No     Comment: He was drinking 3-4 drinks nightly x 40 years, quit in 2014.    . Drug Use: No     Comment: history of crack use and drug tested weekly at the [place]  . Sexual Activity: Not on file   Other Topics Concern  . Not on file   Social History Narrative   No regular exercise   epworth sleepiness scale = 13 (06/12/15)   Lives alone in a one story home.  Has 4 children and 9 grandchildren.  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  Wants to keep working but it is hard for him to stand.  Education: 14 years and 20 years of [employer].      Family History  Problem Relation Age of Onset  . Diabetes Mother     Deceased  . Heart disease Father     Deceased  . Heart attack Father     x5  . Cancer - Other Sister   . Healthy Son   . Healthy Daughter   . Heart attack Brother     Deceased  . Alcohol abuse Brother     Deceased    Review of Systems  Constitutional: Positive for fatigue. Negative for fever and appetite change.  Cardiovascular: Positive for chest pain (non-cardiac - ? indigestion). Negative for palpitations and leg swelling.  Gastrointestinal: Positive for blood in stool. Negative for nausea, abdominal pain, diarrhea and constipation.  Neurological: Positive for headaches. Negative for dizziness and light-headedness.        Objective:   Filed Vitals:   10/05/15 1123  BP: 160/106  Pulse: 63  Temp: 98 F (36.7 C)  Resp: 16   Filed Weights   10/05/15 1123  Weight: 168 lb (76.204 kg)   Body mass index is 27.52 kg/(m^2).   Physical Exam  Constitutional: He appears well-developed and well-nourished. No distress.  Cardiovascular: Normal rate, regular rhythm and normal heart sounds.   No murmur heard. Pulmonary/Chest: Effort normal and  breath sounds normal. No respiratory distress. He has no wheezes. He has no rales.  Abdominal: Soft. He exhibits no distension and no mass. There is no tenderness. There is no rebound and no guarding.  obese  Musculoskeletal: He exhibits no edema.  Skin: Skin is warm and dry. He is not diaphoretic.          Assessment & Plan:   See Problem List for Assessment and Plan of chronic medical problems.

## 2015-10-05 NOTE — Assessment & Plan Note (Signed)
BP elevated here today, but controlled at home and elsewhere Continue to monitor at home cmp

## 2015-10-05 NOTE — Progress Notes (Signed)
Pre visit review using our clinic review tool, if applicable. No additional management support is needed unless otherwise documented below in the visit note. 

## 2015-10-06 ENCOUNTER — Ambulatory Visit (INDEPENDENT_AMBULATORY_CARE_PROVIDER_SITE_OTHER): Payer: Medicare Other | Admitting: Neurology

## 2015-10-06 DIAGNOSIS — M792 Neuralgia and neuritis, unspecified: Secondary | ICD-10-CM

## 2015-10-06 DIAGNOSIS — G621 Alcoholic polyneuropathy: Secondary | ICD-10-CM

## 2015-10-06 DIAGNOSIS — R269 Unspecified abnormalities of gait and mobility: Secondary | ICD-10-CM

## 2015-10-06 NOTE — Procedures (Signed)
[facility]  [address] [place], [address]  [place], [place] [address] Tel: [phone] Fax:  [phone] Test Date:  10/06/2015  Patient: Gerald Williams DOB: 09-Mar-1951 Physician: Nita Sickle, DO  Sex: Male Height: 5\' 5"  Ref Phys: Nita Sickle, DO  ID#: [id number] Temp: 32.1C Technician: Judie Petit. Dean   Patient Complaints: This is a 65 year old gentleman referred for evaluation of bilateral lower extremity pain and paresthesias.  NCV & EMG Findings: Extensive electrodiagnostic testing of the right lower extremity and additional studies of the left shows: 1. Bilateral sural and superficial peroneal sensory responses are within normal limits. 2. Bilateral peroneal and tibial motor responses are within normal limits. 3. Bilateral tibial H reflex studies are within normal limits. 4. There is no evidence of active or chronic motor axon loss changes affecting any of the tested muscles. Motor unit configuration and recruitment pattern is within normal limits.  Impression: This is a normal study of the lower extremities.  In particular, there is no evidence of a large fiber sensorimotor polyneuropathy or lumbosacral radiculopathy. However, a small fiber neuropathy cannot be excluded by this study.   ___________________________ Nita Sickle, DO    Nerve Conduction Studies Anti Sensory Summary Table   Stim Site NR Peak (ms) Norm Peak (ms) P-T Amp (V) Norm P-T Amp  Left Sup Peroneal Anti Sensory (Ant Lat Mall)  12 cm    3.3 <4.6 6.5 >3  Right Sup Peroneal Anti Sensory (Ant Lat Mall)  12 cm    3.2 <4.6 8.1 >3  Left Sural Anti Sensory (Lat Mall)  Calf    3.3 <4.6 15.5 >3  Right Sural Anti Sensory (Lat Mall)  Calf    3.0 <4.6 6.0 >3   Motor Summary Table   Stim Site NR Onset (ms) Norm Onset (ms) O-P Amp (mV) Norm O-P Amp Site1 Site2 Delta-0 (ms) Dist (cm) Vel (m/s) Norm Vel (m/s)  Left Peroneal Motor (Ext Dig Brev)  Ankle    3.3 <6.0 9.2 >2.5 B Fib Ankle 7.5 33.0 44 >40  B  Fib    10.8  7.2  Poplt B Fib 2.2 10.0 45 >40  Poplt    13.0  6.6         Right Peroneal Motor (Ext Dig Brev)  Ankle    3.9 <6.0 3.8 >2.5 B Fib Ankle 7.4 33.0 45 >40  B Fib    11.3  3.0  Poplt B Fib 1.7 10.0 59 >40  Poplt    13.0  2.8         Left Tibial Motor (Abd Hall Brev)  Ankle    3.8 <6.0 8.5 >4 Knee Ankle 9.7 42.0 43 >40  Knee    13.5  6.5         Right Tibial Motor (Abd Hall Brev)  Ankle    3.8 <6.0 5.2 >4 Knee Ankle 9.3 40.0 43 >40  Knee    13.1  4.3          H Reflex Studies   NR H-Lat (ms) Lat Norm (ms) L-R H-Lat (ms) M-Lat (ms) HLat-MLat (ms)  Left Tibial (Gastroc)     34.42 <35 0.27 4.35 30.07  Right Tibial (Gastroc)     34.15 <35 0.27 4.35 29.80   EMG   Side Muscle Ins Act Fibs Psw Fasc Number Recrt Dur Dur. Amp Amp. Poly Poly. Comment  Right AntTibialis Nml Nml Nml Nml Nml Nml Nml Nml Nml Nml Nml Nml N/A  Right Gastroc Nml Nml Nml  Nml Nml Nml Nml Nml Nml Nml Nml Nml N/A  Right Flex Dig Long Nml Nml Nml Nml Nml Nml Nml Nml Nml Nml Nml Nml N/A  Right RectFemoris Nml Nml Nml Nml Nml Nml Nml Nml Nml Nml Nml Nml N/A  Right GluteusMed Nml Nml Nml Nml Nml Nml Nml Nml Nml Nml Nml Nml N/A  Left AntTibialis Nml Nml Nml Nml Nml Nml Nml Nml Nml Nml Nml Nml N/A  Left Gastroc Nml Nml Nml Nml Nml Nml Nml Nml Nml Nml Nml Nml N/A  Left Flex Dig Long Nml Nml Nml Nml Nml Nml Nml Nml Nml Nml Nml Nml N/A  Left RectFemoris Nml Nml Nml Nml Nml Nml Nml Nml Nml Nml Nml Nml N/A      Waveforms:

## 2015-10-07 ENCOUNTER — Encounter: Payer: Self-pay | Admitting: Emergency Medicine

## 2015-10-07 ENCOUNTER — Other Ambulatory Visit: Payer: Self-pay | Admitting: *Deleted

## 2015-10-07 DIAGNOSIS — M792 Neuralgia and neuritis, unspecified: Secondary | ICD-10-CM

## 2015-10-07 DIAGNOSIS — G621 Alcoholic polyneuropathy: Secondary | ICD-10-CM

## 2015-10-19 ENCOUNTER — Telehealth: Payer: Self-pay | Admitting: Pulmonary Disease

## 2015-10-19 NOTE — Telephone Encounter (Signed)
Envelope received Per written letter inside, patient is asking for SN's professional opinion on the cooking device/gas he used while in the [employer]. Envelope placed on SN's cart

## 2015-10-20 NOTE — Telephone Encounter (Signed)
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  We did not get into the details of his service- where, when, how long & known exposure incidents.  I explained to him that knowledge of this nature is beyond the scope of my practice & I don't have any expertise in these areas.  I pointed out to him that his mod smoking hx & hx of other exposures would surely be implicated as they are known to cause the kind of lung damage that he has.  I offered to refer him to [facility] or other facility when occupational specialists can give him the time and expertise that he'd need in this endeavor.  He has not yet taken this up w/ the [place], but this is his next step & he will keep me informed...  SMN

## 2015-10-20 NOTE — Telephone Encounter (Signed)
Jess, has this been taken care of? Please advise. 

## 2015-11-02 DIAGNOSIS — Z1211 Encounter for screening for malignant neoplasm of colon: Secondary | ICD-10-CM | POA: Diagnosis not present

## 2015-11-02 DIAGNOSIS — K625 Hemorrhage of anus and rectum: Secondary | ICD-10-CM | POA: Diagnosis not present

## 2015-11-02 DIAGNOSIS — Z86010 Personal history of colonic polyps: Secondary | ICD-10-CM | POA: Diagnosis not present

## 2015-11-04 ENCOUNTER — Encounter: Payer: Self-pay | Admitting: Internal Medicine

## 2015-11-13 ENCOUNTER — Encounter: Payer: Self-pay | Admitting: Pulmonary Disease

## 2015-11-18 ENCOUNTER — Telehealth: Payer: Self-pay

## 2015-11-18 NOTE — Telephone Encounter (Signed)
Please advise what I should do with letter.

## 2015-11-18 NOTE — Telephone Encounter (Signed)
Ok to write letter

## 2015-11-18 NOTE — Telephone Encounter (Signed)
Patient states he is waiting on a letter from [place] about his medications and the side affects it can have on him. That he needs to give to the [facility]A? I don't see a note or a letter about this. Do you know anything about it. Please advise or follow up. THank you.

## 2015-11-20 DIAGNOSIS — K621 Rectal polyp: Secondary | ICD-10-CM | POA: Diagnosis not present

## 2015-11-20 DIAGNOSIS — Z86010 Personal history of colonic polyps: Secondary | ICD-10-CM | POA: Diagnosis not present

## 2015-11-20 DIAGNOSIS — K635 Polyp of colon: Secondary | ICD-10-CM | POA: Diagnosis not present

## 2015-11-20 DIAGNOSIS — Z1211 Encounter for screening for malignant neoplasm of colon: Secondary | ICD-10-CM | POA: Diagnosis not present

## 2015-11-23 NOTE — Telephone Encounter (Signed)
Patient came in to check on letter

## 2015-11-24 ENCOUNTER — Encounter: Payer: Self-pay | Admitting: Emergency Medicine

## 2015-11-24 NOTE — Telephone Encounter (Signed)
Spoke with pt, he will pick up letter at front office.

## 2015-11-26 ENCOUNTER — Ambulatory Visit: Payer: Medicare Other | Admitting: Pulmonary Disease

## 2015-12-07 ENCOUNTER — Telehealth: Payer: Self-pay

## 2015-12-07 ENCOUNTER — Ambulatory Visit: Payer: Medicare Other

## 2015-12-07 DIAGNOSIS — Z719 Counseling, unspecified: Secondary | ICD-10-CM

## 2015-12-07 NOTE — Telephone Encounter (Signed)
Patient came in today for glucose meter education about meter that was given to him from [facility]---patient does not like this meter, he states it is too hard for him to understand how to work appropriately---I have given him sample of one touch verio flex meter from our office, we tried using meter while patient was here in office and patient likes this meter, per patient request, I am sending rx to his pharm and if cost is too high, patient is advised to call us back---I am routing to dr burns to request permission to add one touch verio flex meter to patient's chart and to send in new rx for meter and supplies----please advise, thanks

## 2015-12-07 NOTE — Telephone Encounter (Signed)
Sounds good thanks

## 2015-12-07 NOTE — Progress Notes (Signed)
Education completed.

## 2015-12-08 MED ORDER — LANCETS MISC: 3 refills | Status: DC

## 2015-12-08 MED ORDER — GLUCOSE BLOOD VI STRP: ORAL_STRIP | 3 refills | Status: DC

## 2015-12-08 NOTE — Addendum Note (Signed)
Addended by: Alonna Minium on: 12/08/2015 09:55 AM   Modules accepted: Orders

## 2015-12-08 NOTE — Telephone Encounter (Signed)
One touch verio flex supplies sent to pharm per patient request

## 2015-12-16 ENCOUNTER — Encounter: Payer: Medicare Other | Admitting: Internal Medicine

## 2015-12-16 NOTE — Assessment & Plan Note (Signed)
Lab Results  Component Value Date   HGBA1C 6.3 10/05/2015   Sugars in prediabetic range Stressed exercise, low sugar carb diet and keeping weight down Recheck in 6 months

## 2015-12-16 NOTE — Progress Notes (Signed)
Subjective:    Patient ID: Gerald Williams, male    DOB: [birth year], 65 y.o.   MRN: [id number]  HPI The patient is here for follow up.  Hypertension: He is taking his medication daily. He is compliant with a low sodium diet.  He denies chest pain, palpitations, edema, shortness of breath and regular headaches. He is exercising regularly.  He does not monitor his blood pressure at home.    Hyperlipidemia: He is taking his medication daily. He is compliant with a low fat/cholesterol diet. He is exercising regularly. He denies myalgias.   Prediabetes:  He is compliant with a low sugar/carbohydrate diet.  He is exercising regularly.   Medications and allergies reviewed with patient and updated if appropriate.  Patient Active Problem List   Diagnosis Date Noted  . Blood in stool 10/05/2015  . Essential hypertension 06/18/2015  . Cephalalgia 06/18/2015  . Dyslipidemia 06/12/2015  . COPD mixed type (HCC) 05/28/2015  . Peripheral neuropathy (HCC) 05/20/2015  . COPD (chronic obstructive pulmonary disease) with emphysema (HCC) 05/20/2015  . PTSD (post-traumatic stress disorder) 05/20/2015  . Right carotid bruit 05/20/2015  . Frequent urination 05/20/2015  . Hematuria 07/15/2014  . History of tobacco abuse:  quit in ~ 06/2013  07/14/2014  . OSA on CPAP 07/14/2014  . Pre-diabetes 07/14/2014  . Claudication, R>L 07/14/2014  . Cocaine use, history of 03/26/2011  . Chest pain 03/26/2011    Current Outpatient Prescriptions on File Prior to Visit  Medication Sig Dispense Refill  . albuterol (PROVENTIL HFA;VENTOLIN HFA) 108 (90 BASE) MCG/ACT inhaler Inhale 2 puffs into the lungs every 6 (six) hours as needed for shortness of breath.    Marland Kitchen albuterol (PROVENTIL) (2.5 MG/3ML) 0.083% nebulizer solution Take 2.5 mg by nebulization 3 (three) times daily.     Marland Kitchen albuterol-ipratropium (COMBIVENT) 18-103 MCG/ACT inhaler Inhale 1 puff into the lungs every 6 (six) hours as needed for wheezing. 1 Inhaler  5  . aspirin 81 MG tablet Take 81 mg by mouth daily.    Marland Kitchen atorvastatin (LIPITOR) 80 MG tablet Take 80 mg by mouth at bedtime.    . budesonide-formoterol (SYMBICORT) 160-4.5 MCG/ACT inhaler Inhale 2 puffs into the lungs 2 (two) times daily. 1 Inhaler 5  . cyclobenzaprine (FLEXERIL) 10 MG tablet Take 10 mg by mouth 3 (three) times daily as needed for muscle spasms.    Marland Kitchen glucose blood (COOL BLOOD GLUCOSE TEST STRIPS) test strip Used to test blood sugar x2 daily---diagnosis code r73.03--for one touch verio flex 200 each 3  . ipratropium (ATROVENT) 0.02 % nebulizer solution Take 0.5 mg by nebulization 3 (three) times daily.    . Lancets MISC Used to test blood sugar x2 daily---diagnosis code r73.03--for one touch verio flex 200 each 3  . MIRTAZAPINE PO Take by mouth at bedtime.    . NON FORMULARY at bedtime. CPAP w/oxygen - followed at [facility]    . nortriptyline (PAMELOR) 10 MG capsule Take 1 capsule (10 mg total) by mouth at bedtime. 30 capsule 5  . sildenafil (VIAGRA) 100 MG tablet Take 100 mg by mouth daily as needed for erectile dysfunction.    . Tiotropium Bromide Monohydrate (SPIRIVA RESPIMAT IN) Inhale 2.5 Inhalers into the lungs 2 (two) times daily.    Marland Kitchen umeclidinium bromide (INCRUSE ELLIPTA) 62.5 MCG/INH AEPB Inhale 1 puff into the lungs daily. 30 each 11  . valsartan (DIOVAN) 80 MG tablet Take 1 tablet (80 mg total) by mouth daily. 30 tablet 3   No current  facility-administered medications on file prior to visit.     Past Medical History:  Diagnosis Date  . Emphysema   . Erectile dysfunction   . Hyperlipemia   . Polysubstance abuse    stopped using crack/cocaine December 4th 20-14  . Sleep apnea     Past Surgical History:  Procedure Laterality Date  . LEFT HEART CATHETERIZATION WITH CORONARY ANGIOGRAM N/A 07/15/2014   Procedure: LEFT HEART CATHETERIZATION WITH CORONARY ANGIOGRAM;  Surgeon: Lennette Biharihomas A Kelly, MD;  Location[facility] C[facility] LAB;  Service: Cardiovascular;  Laterality: N/A;     Social History   Social History  . Marital status: Divorced    Spouse name: N/A  . Number of children: 4  . Years of education: N/A   Occupational History  . chef - retired    Social History Main Topics  . Smoking status: Former Smoker    Packs/day: 1.50    Years: 50.00    Types: Cigarettes    Start date: 04/26/1963    Quit date: 09/22/2013  . Smokeless tobacco: Never Used     Comment: patient using the patch, losenges, and medication  . Alcohol use No     Comment: He was drinking 3-4 drinks nightly x 40 years, quit in 2014.    . Drug use: No     Comment: history of crack use and [facility]  . Sexual activity: Not on file   Other Topics Concern  . Not on file   Social History Narrative   No regular exercise   epworth sleepiness scale = 13 (06/12/15)   Lives alone in a one story home.  Has 4 children and 9 grandchildren.  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  Wants to keep working but it is hard for him to stand.  Education: 14 years and 20 years of [employer].      Family History  Problem Relation Age of Onset  . Diabetes Mother     Deceased  . Heart disease Father     Deceased  . Heart attack Father     x5  . Cancer - Other Sister   . Healthy Son   . Healthy Daughter   . Heart attack Brother     Deceased  . Alcohol abuse Brother     Deceased    Review of Systems     Objective:  There were no vitals filed for this visit. There were no vitals filed for this visit. There is no height or weight on file to calculate BMI.   Physical Exam        Assessment & Plan:    See Problem List for Assessment and Plan of chronic medical problems.    This encounter was created in error - please disregard.  This encounter was created in error - please disregard.

## 2015-12-21 ENCOUNTER — Ambulatory Visit (INDEPENDENT_AMBULATORY_CARE_PROVIDER_SITE_OTHER): Payer: Medicare Other | Admitting: Pulmonary Disease

## 2015-12-21 ENCOUNTER — Encounter: Payer: Self-pay | Admitting: Pulmonary Disease

## 2015-12-21 VITALS — BP 128/84 | HR 63 | Ht 65.5 in | Wt 169.1 lb

## 2015-12-21 DIAGNOSIS — G4733 Obstructive sleep apnea (adult) (pediatric): Secondary | ICD-10-CM | POA: Diagnosis not present

## 2015-12-21 DIAGNOSIS — J432 Centrilobular emphysema: Secondary | ICD-10-CM

## 2015-12-21 DIAGNOSIS — Z9989 Dependence on other enabling machines and devices: Secondary | ICD-10-CM

## 2015-12-21 DIAGNOSIS — F4310 Post-traumatic stress disorder, unspecified: Secondary | ICD-10-CM

## 2015-12-21 NOTE — Progress Notes (Signed)
Subjective:     Patient ID: Gerald Williams, male   DOB: 29-Aug-1950, 65 y.o.   MRN: [id number]  HPI  ~  May 28, 2015:  Initial pulmonary evaluation by SN>   56 y/o BM referred by Dr.Stacey Williams, [facility], for a pulmonary evaluation> He is also followed by the [place] but we do not have any of their records...      He was told he has severe emphysema by the [facility] but he doesn't know any details of his eval; he is an ex-smoker & quit 2 yrs ago;  He notes SOB w/ min activities, sl cough, denies sputum/ hemoptysis/ etc;  He says he was prev [facility] but [facility];  He is treated w/ Symbicort160- 2spBid and NEBULIZER w/ Albut & IpratropiumQid (he averages 1-2 per day)... He also has OSA evaluated at the [facility] & is on CPAP nightly "set 9-13 Auto" he says & he affirms that it is working well, rests well, wakes refreshed and no daytime sleepiness issues; he uses Home O2 bled into his CPAP Qhs...   Smoking Hx>  He is an ex-smoker, starting in his teens, smoked for 50 yrs up to 1+ppd, and quit 2 yrs ago at age 19; est ~60 pack-year smoking hx...  Pulmonary Hx> Hx COPD/ emphysema & [facility] [place]...  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  Current Meds>  Symbicort160-2spBid but only using prn, NEBS w/ Albut & CombiventHFA rescue inhaler prn, ASA81, Diovan80, Lipitor80, Lyrica50Tid, Flexeril10 prn... EXAM shows Afeb, VSS, O2sat=95% on RA at rest;  HEENT- neg, mallampati2;  Chest- decr BS at bases, clear w/o w/r/r;  Heart- RR, distant heart sounds w/o m/r/g;  Abd- soft, neg;  Ext- decr sensation c/w neuropathy w/o c/c/e...  [facility] 03/2014  indicated 33minWT= 1106 feet w/ 0 breaks & lowest O2sat=94% on 4L/min O2 & heart rate 103/min (he participated in rehab 11/2013=>03/2014)  CTAngio Chest 03/13/15>  NEG for PE, COPD & mild posterior segmental atx, no adenopathy, effusion, pneumothorax, etc...  CXR 03/13/15 showed hyperinflation & attenuation of the pulm markings, scarring in lingula, otherw clear/ NAD.Marland KitchenMarland Kitchen   EKG 03/13/15 showed NSR, rate67, NAD...  Spirometry 05/28/15>  FVC=1.35 (40%), FEV1=0.56 (21%), %1sec=42, mid-flows reduced at 7% predicted;  This is c/w severe airflow obstruction and GOLD Stage4 COPD  Ambulatory Oximetry on RA 05/28/15>  O2sat=96% on RA at rest w/ pulse=67/min;  He ambulated 3 laps w/ lowest O2sat=89% w/ heart rate up to 99/min...  LABS 04/2015 in Epic>  Chems- ok x Cr=1.4, A1c=6.2;  CBC- wnl...                       CXR 03/13/15  IMP/PLAN>>  Gerald Williams has severe COPD/emphysema w/ very severe airflow obstruction on his spirometry c/w GOLD Stage4 disease;  He is on a reasonable regimen if he'll take it regularly and the meds are avail to him thru the [place] system- rec that he use the SYMBICORT160- 2sp Bid and the DUONEB Bid spacing the treatments out 4 times daily (see AVS);  He needs to stay off  all tobacco products, stay active w/ exercise program, and avoid infections;  He will call me for any issues w/ his breathing & we will plan a f/u in 42mo...  ~  Aug 26, 2015:  42mo ROV w/ SN>  Gerald Williams is an ex-smoker, quit 2 yrs ago w/ a 60+pack-yr hx, & severe GOLD Stage 4 COPD w/ FEV1=0.56 (21%) Feb2017=> we adjusted his bronchodil regimen w/ NEBULIZER W? Duoneb Bid & SYMBICORT160-2spBid (gets meds from the [place]);  We discussed avoiding infections, and incr exercise;  He feels that his breathing is worse & he is going thru his rescue inhaler Q3wks;  Notes incr cough & some greenish sput over the last few weeks;  He has midsternal CP & a HA each AM that resolves after he takes his BP meds;  He reports a fall last week 7 has a follow up appt  w/ Neurology coming up (neuropathy)...     Severe COPD/emphysema w/ GOLD Stage 4 disease and FEV1=0.56 (21%)>  He was prev on [facility]O2 but the [facility] stopped [facility];     Ex-smoker w/ 60+pack-yr hx smoking and he quit 2015    OSA on CPAP nightly "set 9-13 Auto">  eval and Rx from the [place] & we do not have their data but he confirms good compliance & efficacy- rests well, no daytime hypersomnolence, O2 is bled into the CPAP Qhs by his hx.    Medical issues>  HBP, HL, prediabetes, CATH 07/15/14 by Gerald Williams showed norm coronaries & norm LV w/ EF=60%; C Bruit, Neuropathy, Hx polysubstance abuse- cocaine/ alcohol, & PTSD EXAM shows Afeb, VSS, O2sat=95% on RA at rest;  HEENT- neg, mallampati2;  Chest- decr BS at bases, clear w/o w/r/r;  Heart- RR, distant heart sounds w/o m/r/g;  Abd- soft, neg;  Ext- decr sensation c/w neuropathy w/o c/c/e... IMP/PLAN>>  We need to maximize [name]'s bronchodil regimen- increase NEB w/ DUONEB to TID ([facility] gives him separate Albut+Ipratropium), continue SYMBICORT160-2spBid, add INCRUSE once daily;  He notes that PTSD is under better control...   ~  December 21, 2015:  73mo ROV & pulm follow up>  Gerald Williams tells me that he is applying for VA disability & he had me write a letter on his behalf because he has severe COPD, was a cigarwette smoker, and exposed to agent orange & cooking fumes while in [place]; he has Stage 4 COPD/ emphysema and OSA evaluated & treated by the [place]... We reviewed the following medical problems during today's office visit >>     Severe COPD/emphysema w/ GOLD Stage 4 disease and FEV1=0.56 (21%)>  He was prev on HomeO2 but the [facility] stopped it; currently taking NEBS w/ Duoneb Tid, followed by Symbicort160-2spBid & Spiriva Respimat-2sp once daily (of course he gets his meds from the [place]; he tells me he exercises in a Gym at a slow pace several days per wk...    Ex-smoker w/ 60+pack-yr hx smoking and he quit 2015.    OSA on CPAP nightly "set 9-13 Auto", & Nocturnal hypoxemia>  eval and Rx  from the [place] & we do not have their data but he confirms good compliance & efficacy- rests well, no daytime hypersomnolence, O2 is bled into the CPAP Qhs by his hx.    Medical issues> his PCP is DrSBurns- HBP (on diovan80), HL (on Lip80), prediabetes, CATH 07/15/14 by Gerald Williams showed norm coronaries & norm LV w/ EF=60%; C Bruit, Neuropathy, Hx polysubstance abuse- cocaine/ alcohol, & PTSD... He had a recent ROV w/ DrBurns 12/16/15 (  note reviewed by me in epic). EXAM shows Afeb, VSS, O2sat=95% on RA at rest;  HEENT- neg, mallampati2;  Chest- decr BS at bases, clear w/o w/r/r;  Heart- RR, distant heart sounds w/o m/r/g;  Abd- soft, neg;  Ext- decr sensation c/w neuropathy w/o c/c/e...  LABS 5-6/12/17 (reviewed by me in epic) showed Chems- wnl;  Low Thiamine level... IMP/PLAN>>  [name] has severe COPD/ emphysema & OSA w/ nocturnal hypoxemia; a lot of his effort these days is going into a quest for disability from the [place]; we reviewed the severity of his lung dis & the need for regular dosing of his meds, regular exercise, and the regular use of his CPAP & nocturnal O2... Note: >50% of this 25 min visit was spent in counseling & coordination of care... We plan an ROV recheck in 58mo, sooner if needed prn...    Past Medical History:  Diagnosis Date  . Emphysema   . Erectile dysfunction   . Hyperlipemia   . Polysubstance abuse    stopped using crack/cocaine December 4th 20-14  . Sleep apnea     Past Surgical History:  Procedure Laterality Date  . LEFT HEART CATHETERIZATION WITH CORONARY ANGIOGRAM N/A 07/15/2014   Procedure: LEFT HEART CATHETERIZATION WITH CORONARY ANGIOGRAM;  Surgeon: Lennette Bihari, MD;  Location: [facility] CATH LAB;  Service: Cardiovascular;  Laterality: N/A;    Outpatient Encounter Prescriptions as of 12/21/2015  Medication Sig  . albuterol (PROVENTIL HFA;VENTOLIN HFA) 108 (90 BASE) MCG/ACT inhaler Inhale 2 puffs into the lungs every 6 (six) hours as needed for shortness of breath.  Marland Kitchen  albuterol (PROVENTIL) (2.5 MG/3ML) 0.083% nebulizer solution Take 2.5 mg by nebulization 3 (three) times daily.   Marland Kitchen albuterol-ipratropium (COMBIVENT) 18-103 MCG/ACT inhaler Inhale 1 puff into the lungs every 6 (six) hours as needed for wheezing.  Marland Kitchen aspirin 81 MG tablet Take 81 mg by mouth daily.  Marland Kitchen atorvastatin (LIPITOR) 80 MG tablet Take 80 mg by mouth at bedtime.  . budesonide-formoterol (SYMBICORT) 160-4.5 MCG/ACT inhaler Inhale 2 puffs into the lungs 2 (two) times daily.  . cyclobenzaprine (FLEXERIL) 10 MG tablet Take 10 mg by mouth 3 (three) times daily as needed for muscle spasms.  Marland Kitchen ipratropium (ATROVENT) 0.02 % nebulizer solution Take 0.5 mg by nebulization 3 (three) times daily.  Marland Kitchen MIRTAZAPINE PO Take by mouth at bedtime.  . NON FORMULARY at bedtime. CPAP w/oxygen - followed at [facility]  . pregabalin (LYRICA) 50 MG capsule Take 1 capsule (50 mg total) by mouth 3 (three) times daily.  . sildenafil (VIAGRA) 100 MG tablet Take 100 mg by mouth daily as needed for erectile dysfunction.  . valsartan (DIOVAN) 80 MG tablet Take 1 tablet (80 mg total) by mouth daily.    No Known Allergies   Immunization History  Administered Date(s) Administered  . Influenza-Unspecified 01/03/2015    Current Medications, Allergies, Past Medical History, Past Surgical History, Family History, and Social History were reviewed in [employer] record.   Review of Systems             All symptoms NEG except where BOLDED >>  Constitutional:  F/C/S, fatigue, anorexia, unexpected weight change. HEENT:  HA, visual changes, hearing loss, earache, nasal symptoms, sore throat, mouth sores, hoarseness. Resp:  cough, sputum, hemoptysis; SOB, tightness, wheezing. Cardio:  CP, palpit, DOE, orthopnea, edema. GI:  N/V/D/C, blood in stool; reflux, abd pain, distention, gas. GU:  dysuria, freq, urgency, hematuria, flank pain, voiding difficulty. MS:  joint pain, swelling,  tenderness, decr ROM; neck  pain, back pain, etc. Neuro:  HA, tremors, seizures, dizziness, syncope, weakness, numbness, gait abn. Skin:  suspicious lesions or skin rash. Heme:  adenopathy, bruising, bleeding. Psyche:  confusion, agitation, sleep disturbance, hallucinations, anxiety, depression suicidal.   Objective:   Physical Exam       Vital Signs:  Reviewed...   General:  WD, WN, 65 y/o BM in NAD; alert & oriented; pleasant & cooperative... HEENT:  [place]/AT; Conjunctiva- pink, Sclera- nonicteric, EOM-wnl, PERRLA, Fundi-benign; EACs-clear, TMs-wnl; NOSE-clear; THROAT-clear & wnl.  Neck:  Supple w/ fair ROM; no JVD; normal carotid impulses w/o bruits; no thyromegaly or nodules palpated; no lymphadenopathy.  Chest:  DecrBS bilat, clear to P & A; without wheezes, rales, or rhonchi heard (diminished BS bilat) Heart:  Regular Rhythm; norm S1 & S2 without murmurs, rubs, or gallops detected. Abdomen:  Soft & nontender- no guarding or rebound; normal bowel sounds; no organomegaly or masses palpated. Ext:  decrROM; without deformities +arthritic changes; no varicose veins, +venous insuffic, or edema;  Pulses intact w/o bruits. Neuro:  CNs II-XII intact; motor testing normal; sensory testing normal; gait normal & balance OK. Derm:  No lesions noted; no rash etc. Lymph:  No cervical, supraclavicular, axillary, or inguinal adenopathy palpated.   Assessment:      IMP >>     Severe COPD/emphysema w/ GOLD Stage 4 disease and FEV1=0.56 (21%)>  He was prev on [facility] but the [facility];     Ex-smoker w/ 60+pack-yr hx smoking and he quit 2015    OSA on CPAP nightly "set 9-13 Auto">  eval and Rx from the [place] & we do not have their data but he confirms good compliance & efficacy- rests well, no daytime hypersomnolence, O2 is bled into the CPAP Qhs by his hx.    Medical issues>  HBP, HL, prediabetes, CATH 07/15/14 by Gerald Williams showed norm coronaries & norm LV w/ EF=60%; C Bruit, Neuropathy, Hx polysubstance abuse- cocaine/ alcohol, &  PTSD  PLAN>>   05/28/15>   Travin has severe COPD/emphysema w/ very severe airflow obstruction on his spirometry c/w GOLD Stage4 disease;  He is on a reasonable regimen if he'll take it regularly and the meds are avail to him thru the [place] system- rec that he use the SYMBICORT160- 2sp Bid and the DUONEB Bid spacing the treatments out 4 times daily (see AVS);  He needs to stay off all tobacco products, stay active w/ exercise program, and avoid infections;   5/3>   We need to maximize [name]'s bronchodil regimen- increase NEB w/ DUONEB to TID ([facility] gives him separate Albut+Ipratropium), continue SYMBICORT160-2spBid, add INCRUSE once daily;  He notes that PTSD is under better control... 12/21/15>   [name] has severe COPD/ emphysema & OSA w/ nocturnal hypoxemia; a lot of his effort these days is going into a quest for disability from the [place]; we reviewed the severity of his lung dis & the need for regular dosing of his meds, regular exercise, and the regular use of his CPAP & nocturnal O2.     Plan:     Patient's Medications  New Prescriptions   No medications on file  Previous Medications   ALBUTEROL (PROVENTIL HFA;VENTOLIN HFA) 108 (90 BASE) MCG/ACT INHALER    Inhale 2 puffs into the lungs every 6 (six) hours as needed for shortness of breath.   ALBUTEROL (PROVENTIL) (2.5 MG/3ML) 0.083% NEBULIZER SOLUTION    Take 2.5 mg by nebulization 3 (three) times daily.  ALBUTEROL-IPRATROPIUM (COMBIVENT) 18-103 MCG/ACT INHALER    Inhale 1 puff into the lungs every 6 (six) hours as needed for wheezing.   ASPIRIN 81 MG TABLET    Take 81 mg by mouth daily.   ATORVASTATIN (LIPITOR) 80 MG TABLET    Take 80 mg by mouth at bedtime.   BUDESONIDE-FORMOTEROL (SYMBICORT) 160-4.5 MCG/ACT INHALER    Inhale 2 puffs into the lungs 2 (two) times daily.   CYCLOBENZAPRINE (FLEXERIL) 10 MG TABLET    Take 10 mg by mouth 3 (three) times daily as needed for muscle spasms.   GLUCOSE BLOOD (COOL BLOOD GLUCOSE TEST STRIPS) TEST STRIP    Used to  test blood sugar x2 daily---diagnosis code r73.03--for one touch verio flex   IPRATROPIUM (ATROVENT) 0.02 % NEBULIZER SOLUTION    Take 0.5 mg by nebulization 3 (three) times daily.   LANCETS MISC    Used to test blood sugar x2 daily---diagnosis code r73.03--for one touch verio flex   MIRTAZAPINE PO    Take by mouth at bedtime.   NON FORMULARY    at bedtime. [facility]   NORTRIPTYLINE (PAMELOR) 10 MG CAPSULE    Take 1 capsule (10 mg total) by mouth at bedtime.   SILDENAFIL (VIAGRA) 100 MG TABLET    Take 100 mg by mouth daily as needed for erectile dysfunction.   TIOTROPIUM BROMIDE MONOHYDRATE (SPIRIVA RESPIMAT IN)    Inhale 2 puffs into the lungs daily.    VALSARTAN (DIOVAN) 80 MG TABLET    Take 1 tablet (80 mg total) by mouth daily.  Modified Medications   No medications on file  Discontinued Medications   UMECLIDINIUM BROMIDE (INCRUSE ELLIPTA) 62.5 MCG/INH AEPB    Inhale 1 puff into the lungs daily.

## 2015-12-21 NOTE — Patient Instructions (Signed)
Today we updated your med list in our EPIC system...    Continue your current medications the same...  Keep up the good work w/ your exercise program at [employer]...  Be sure to get the 2017 Flu vaccine soon...  Call for any questions...  Let's plan a follow up visit in 28mo , sooner if needed for problems.Marland KitchenMarland Kitchen

## 2015-12-22 ENCOUNTER — Ambulatory Visit: Payer: Medicare Other | Admitting: Audiology

## 2015-12-25 ENCOUNTER — Ambulatory Visit: Payer: Medicare Other | Admitting: Neurology

## 2015-12-31 NOTE — Patient Instructions (Addendum)
  Test(s) ordered today. Your results will be released to MyChart (or called to you) after review, usually within 72hours after test completion. If any changes need to be made, you will be notified at that same time.   Medications reviewed and updated.  No changes recommended at this time.  Your prescription(s) have been submitted to your pharmacy. Please take as directed and contact our office if you believe you are having problem(s) with the medication(s).  A referral was ordered for a psychiatrist with behavioral health.    Please followup in 6 months

## 2015-12-31 NOTE — Progress Notes (Signed)
Subjective:    Patient ID: Gerald Williams, male    DOB: 29-Jun-1950, 65 y.o.   MRN: [id number]  HPI The patient is here for follow up.  Hypertension: He is taking his medication daily. He is compliant with a low sodium diet.  He denies chest pain, palpitations, edema, and regular headaches. He is not exercising regularly due to his COPD.      Hyperlipidemia: He is taking his medication daily. He is compliant with a low fat/cholesterol diet. He is not exercising regularly. He denies myalgias.   Prediabetes:  He is compliant with a low sugar/carbohydrate diet.  He is trying to decrease his snacking at night and trying to lose a little weight.  He is not exercising regularly.  His sugars at home have been around 150 fasting.  He is urinating more.    [facility]:  [facility]  He is now using a cane.  He does PT at the [place] once a month.    Medications and allergies reviewed with patient and updated if appropriate.  Patient Active Problem List   Diagnosis Date Noted  . Blood in stool 10/05/2015  . Essential hypertension 06/18/2015  . Cephalalgia 06/18/2015  . Dyslipidemia 06/12/2015  . COPD mixed type (HCC) 05/28/2015  . Peripheral neuropathy (HCC) 05/20/2015  . COPD (chronic obstructive pulmonary disease) with emphysema (HCC) 05/20/2015  . PTSD (post-traumatic stress disorder) 05/20/2015  . Right carotid bruit 05/20/2015  . Frequent urination 05/20/2015  . Hematuria 07/15/2014  . History of tobacco abuse:  quit in ~ 06/2013  07/14/2014  . OSA on CPAP 07/14/2014  . Pre-diabetes 07/14/2014  . Claudication, R>L 07/14/2014  . Cocaine use, history of 03/26/2011  . Chest pain 03/26/2011    Current Outpatient Prescriptions on File Prior to Visit  Medication Sig Dispense Refill  . albuterol (PROVENTIL HFA;VENTOLIN HFA) 108 (90 BASE) MCG/ACT inhaler Inhale 2 puffs into the lungs every 6 (six) hours as needed for shortness of breath.    Marland Kitchen albuterol (PROVENTIL) (2.5 MG/3ML)  0.083% nebulizer solution Take 2.5 mg by nebulization 3 (three) times daily.     Marland Kitchen albuterol-ipratropium (COMBIVENT) 18-103 MCG/ACT inhaler Inhale 1 puff into the lungs every 6 (six) hours as needed for wheezing. 1 Inhaler 5  . aspirin 81 MG tablet Take 81 mg by mouth daily.    Marland Kitchen atorvastatin (LIPITOR) 80 MG tablet Take 80 mg by mouth at bedtime.    . budesonide-formoterol (SYMBICORT) 160-4.5 MCG/ACT inhaler Inhale 2 puffs into the lungs 2 (two) times daily. 1 Inhaler 5  . cyclobenzaprine (FLEXERIL) 10 MG tablet Take 10 mg by mouth 3 (three) times daily as needed for muscle spasms.    Marland Kitchen glucose blood (COOL BLOOD GLUCOSE TEST STRIPS) test strip Used to test blood sugar x2 daily---diagnosis code r73.03--for one touch verio flex 200 each 3  . ipratropium (ATROVENT) 0.02 % nebulizer solution Take 0.5 mg by nebulization 3 (three) times daily.    . Lancets MISC Used to test blood sugar x2 daily---diagnosis code r73.03--for one touch verio flex 200 each 3  . MIRTAZAPINE PO Take by mouth at bedtime.    . NON FORMULARY at bedtime. CPAP w/oxygen - [facility] at [facility]    . nortriptyline (PAMELOR) 10 MG capsule Take 1 capsule (10 mg total) by mouth at bedtime. 30 capsule 5  . sildenafil (VIAGRA) 100 MG tablet Take 100 mg by mouth daily as needed for erectile dysfunction.    . Tiotropium Bromide Monohydrate (SPIRIVA  RESPIMAT IN) Inhale 2 puffs into the lungs daily.     . valsartan (DIOVAN) 80 MG tablet Take 1 tablet (80 mg total) by mouth daily. 30 tablet 3   No current facility-administered medications on file prior to visit.     Past Medical History:  Diagnosis Date  . Emphysema   . Erectile dysfunction   . Hyperlipemia   . Polysubstance abuse    stopped using crack/cocaine December 4th 20-14  . Sleep apnea     Past Surgical History:  Procedure Laterality Date  . LEFT HEART CATHETERIZATION WITH CORONARY ANGIOGRAM N/A 07/15/2014   Procedure: LEFT HEART CATHETERIZATION WITH CORONARY ANGIOGRAM;  Surgeon:  Lennette Bihari, MD;  Location: [facility] CATH LAB;  Service: Cardiovascular;  Laterality: N/A;    Social History   Social History  . Marital status: Divorced    Spouse name: N/A  . Number of children: 4  . Years of education: N/A   Occupational History  . chef - retired    Social History Main Topics  . Smoking status: Former Smoker    Packs/day: 1.50    Years: 50.00    Types: Cigarettes    Start date: 04/26/1963    Quit date: 09/22/2013  . Smokeless tobacco: Never Used     Comment: patient using the patch, losenges, and medication  . Alcohol use No     Comment: He was drinking 3-4 drinks nightly x 40 years, quit in 2014.    . Drug use: No     Comment: history of crack use and drug tested weekly at the [place]  . Sexual activity: Not on file   Other Topics Concern  . Not on file   Social History Narrative   No regular exercise   epworth sleepiness scale = 13 (06/12/15)   Lives alone in a one story home.  Has 4 children and 9 grandchildren.  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  Wants to keep working but it is hard for him to stand.  Education: 14 years and 20 years of [employer].      Family History  Problem Relation Age of Onset  . Diabetes Mother     Deceased  . Heart disease Father     Deceased  . Heart attack Father     x5  . Cancer - Other Sister   . Healthy Son   . Healthy Daughter   . Heart attack Brother     Deceased  . Alcohol abuse Brother     Deceased    Review of Systems  Constitutional: Positive for fatigue. Negative for fever.  Respiratory: Positive for cough (COPD), shortness of breath (COPD) and wheezing (COPD).   Cardiovascular: Negative for chest pain, palpitations and leg swelling.  Gastrointestinal:       Genella Rife occasionally  Neurological: Negative for dizziness, light-headedness and headaches.  Psychiatric/Behavioral: Positive for sleep disturbance (PTSD, OSA).       Objective:   Vitals:   01/01/16 0911  BP: 132/84  Pulse: 63  Resp: 16  Temp: 97.6  F (36.4 C)   Filed Weights   01/01/16 0911  Weight: 172 lb (78 kg)   Body mass index is 27.76 kg/m.   Physical Exam    Constitutional: Appears well-developed and well-nourished. No distress.  HENT:  Head: Normocephalic and atraumatic.  Neck: Neck supple. No tracheal deviation present. No thyromegaly present.  Cardiovascular: Normal rate, regular rhythm and normal heart sounds.   No murmur heard. No carotid  bruit  Pulmonary/Chest: Effort normal and breath sounds normal. No respiratory distress. No has no wheezes. No rales.  Musculoskeletal: No edema.  Lymphadenopathy: No cervical adenopathy.  Skin: Skin is warm and dry. Not diaphoretic.  Psychiatric: Normal mood and affect. Behavior is normal.     Assessment & Plan:    See Problem List for Assessment and Plan of chronic medical problems.

## 2016-01-01 ENCOUNTER — Other Ambulatory Visit (INDEPENDENT_AMBULATORY_CARE_PROVIDER_SITE_OTHER): Payer: Medicare Other

## 2016-01-01 ENCOUNTER — Ambulatory Visit (INDEPENDENT_AMBULATORY_CARE_PROVIDER_SITE_OTHER): Payer: Medicare Other | Admitting: Internal Medicine

## 2016-01-01 ENCOUNTER — Encounter: Payer: Self-pay | Admitting: Internal Medicine

## 2016-01-01 VITALS — BP 132/84 | HR 63 | Temp 97.6°F | Resp 16 | Ht 66.0 in | Wt 172.0 lb

## 2016-01-01 DIAGNOSIS — F4310 Post-traumatic stress disorder, unspecified: Secondary | ICD-10-CM | POA: Diagnosis not present

## 2016-01-01 DIAGNOSIS — R7303 Prediabetes: Secondary | ICD-10-CM

## 2016-01-01 DIAGNOSIS — I10 Essential (primary) hypertension: Secondary | ICD-10-CM

## 2016-01-01 DIAGNOSIS — E785 Hyperlipidemia, unspecified: Secondary | ICD-10-CM | POA: Diagnosis not present

## 2016-01-01 DIAGNOSIS — G6289 Other specified polyneuropathies: Secondary | ICD-10-CM

## 2016-01-01 LAB — COMPREHENSIVE METABOLIC PANEL
ALK PHOS: 79 U/L (ref 39–117)
ALT: 13 U/L (ref 0–53)
AST: 16 U/L (ref 0–37)
Albumin: 4.2 g/dL (ref 3.5–5.2)
BILIRUBIN TOTAL: 0.7 mg/dL (ref 0.2–1.2)
BUN: 15 mg/dL (ref 6–23)
CALCIUM: 9.3 mg/dL (ref 8.4–10.5)
CO2: 32 meq/L (ref 19–32)
Chloride: 108 mEq/L (ref 96–112)
Creatinine, Ser: 1.29 mg/dL (ref 0.40–1.50)
GFR: 72.12 mL/min (ref >60.00)
GLUCOSE: 112 mg/dL — AB (ref 70–99)
POTASSIUM: 4.1 meq/L (ref 3.5–5.1)
Sodium: 144 mEq/L (ref 135–145)
Total Protein: 7.6 g/dL (ref 6.0–8.3)

## 2016-01-01 LAB — HEMOGLOBIN A1C: HEMOGLOBIN A1C: 6.2 % (ref 4.6–6.5)

## 2016-01-01 NOTE — Assessment & Plan Note (Signed)
Sugars around 150 fasting Will check a1c Working on weight loss

## 2016-01-01 NOTE — Assessment & Plan Note (Signed)
Wants to see a different psychiatrist - symptoms not controlled ? Trial of prazosin -- will refer to psychiatry Continue current medications for now

## 2016-01-01 NOTE — Assessment & Plan Note (Addendum)
Following at the [facility] Doing PT once a month Uses a cane lyrica is helping

## 2016-01-01 NOTE — Assessment & Plan Note (Addendum)
BP well controlled Current regimen effective and well tolerated Continue current medications at current doses cmp  

## 2016-01-01 NOTE — Progress Notes (Signed)
Pre visit review using our clinic review tool, if applicable. No additional management support is needed unless otherwise documented below in the visit note. 

## 2016-01-01 NOTE — Assessment & Plan Note (Signed)
Continue atorvastatin 80 mg daily Work on weight loss, improve diet/dec snacks

## 2016-01-14 ENCOUNTER — Encounter: Payer: Self-pay | Admitting: Internal Medicine

## 2016-02-03 ENCOUNTER — Telehealth: Payer: Self-pay | Admitting: Internal Medicine

## 2016-02-03 NOTE — Telephone Encounter (Signed)
He needs to be seen somewhere.

## 2016-02-03 NOTE — Telephone Encounter (Signed)
Patient Name: Gerald Williams  DOB: [birth year]    Initial Comment Caller states he's having headaches on the right side of his head, over a week.   Nurse Assessment  Nurse: Stefano Gaul, RN, Vera Date/Time (Eastern Time): 02/03/2016 1:35:55 PM  Confirm and document reason for call. If symptomatic, describe symptoms. You must click the next button to save text entered. ---Caller states he has had headaches on the right side of his head for about 2 weeks. He is taking his BP medications. BP readings have been ok. Advil helps the headache some.  Has the patient traveled out of the country within the last 30 days? ---Not Applicable  Does the patient have any new or worsening symptoms? ---Yes  Will a triage be completed? ---Yes  Related visit to physician within the last 2 weeks? ---No  Does the PT have any chronic conditions? (i.e. diabetes, asthma, etc.) ---Yes  List chronic conditions. ---HTN  Is this a behavioral health or substance abuse call? ---No     Guidelines    Guideline Title Affirmed Question Affirmed Notes  Headache [1] New headache AND [2] age > 2    Final Disposition User   See Physician within 24 Hours Stringer, RN, Dwana Curd    Comments  pt states he can not come to office today but would like appt tomorrow. No appts available at [facility]. Pt does not want to go to urgent care or another office. Please call pt back regarding appt.   Referrals  GO TO FACILITY REFUSED   Disagree/Comply: Disagree  Disagree/Comply Reason: Wait and see

## 2016-02-03 NOTE — Telephone Encounter (Signed)
Spoke with pt, he is okay with going to another office.

## 2016-02-04 ENCOUNTER — Ambulatory Visit (INDEPENDENT_AMBULATORY_CARE_PROVIDER_SITE_OTHER): Payer: Medicare Other | Admitting: Adult Health

## 2016-02-04 VITALS — BP 110/70 | Temp 98.4°F | Ht 66.0 in | Wt 167.1 lb

## 2016-02-04 DIAGNOSIS — G43001 Migraine without aura, not intractable, with status migrainosus: Secondary | ICD-10-CM | POA: Diagnosis not present

## 2016-02-04 MED ORDER — KETOROLAC TROMETHAMINE 60 MG/2ML IM SOLN
60.0000 mg | Freq: Once | INTRAMUSCULAR | Status: AC
  Administered 2016-02-04: 60 mg via INTRAMUSCULAR

## 2016-02-04 NOTE — Patient Instructions (Addendum)
It was great meeting you today  Your exam is consistent with a migraine headache.   As discussed, try taking Excedrin Migraine and getting a new pillow. If you continue to have headaches then follow up with Dr. Lawerance Bach in two weeks  Make sure you are staying hydrated   Migraine Headache A migraine headache is an intense, throbbing pain on one or both sides of your head. A migraine can last for 30 minutes to several hours. CAUSES  The exact cause of a migraine headache is not always known. However, a migraine may be caused when nerves in the brain become irritated and release chemicals that cause inflammation. This causes pain. Certain things may also trigger migraines, such as:  Alcohol.  Smoking.  Stress.  Menstruation.  Aged cheeses.  Foods or drinks that contain nitrates, glutamate, aspartame, or tyramine.  Lack of sleep.  Chocolate.  Caffeine.  Hunger.  Physical exertion.  Fatigue.  Medicines used to treat chest pain (nitroglycerine), birth control pills, estrogen, and some blood pressure medicines. SIGNS AND SYMPTOMS  Pain on one or both sides of your head.  Pulsating or throbbing pain.  Severe pain that prevents daily activities.  Pain that is aggravated by any physical activity.  Nausea, vomiting, or both.  Dizziness.  Pain with exposure to bright lights, loud noises, or activity.  General sensitivity to bright lights, loud noises, or smells. Before you get a migraine, you may get warning signs that a migraine is coming (aura). An aura may include:  Seeing flashing lights.  Seeing bright spots, halos, or zigzag lines.  Having tunnel vision or blurred vision.  Having feelings of numbness or tingling.  Having trouble talking.  Having muscle weakness. DIAGNOSIS  A migraine headache is often diagnosed based on:  Symptoms.  Physical exam.  A CT scan or MRI of your head. These imaging tests cannot diagnose migraines, but they can help rule  out other causes of headaches. TREATMENT Medicines may be given for pain and nausea. Medicines can also be given to help prevent recurrent migraines.  HOME CARE INSTRUCTIONS  Only take over-the-counter or prescription medicines for pain or discomfort as directed by your health care provider. The use of long-term narcotics is not recommended.  Lie down in a dark, quiet room when you have a migraine.  Keep a journal to find out what may trigger your migraine headaches. For example, write down:  What you eat and drink.  How much sleep you get.  Any change to your diet or medicines.  Limit alcohol consumption.  Quit smoking if you smoke.  Get 7-9 hours of sleep, or as recommended by your health care provider.  Limit stress.  Keep lights dim if bright lights bother you and make your migraines worse. SEEK IMMEDIATE MEDICAL CARE IF:   Your migraine becomes severe.  You have a fever.  You have a stiff neck.  You have vision loss.  You have muscular weakness or loss of muscle control.  You start losing your balance or have trouble walking.  You feel faint or pass out.  You have severe symptoms that are different from your first symptoms. MAKE SURE YOU:   Understand these instructions.  Will watch your condition.  Will get help right away if you are not doing well or get worse.   This information is not intended to replace advice given to you by your health care provider. Make sure you discuss any questions you have with your health care provider.  Document Released: 04/11/2005 Document Revised: 05/02/2014 Document Reviewed: 12/17/2012 Elsevier Interactive Patient Education [employer].

## 2016-02-04 NOTE — Progress Notes (Signed)
Subjective:    Patient ID: Gerald Williams, male    DOB: 1950-07-16, 65 y.o.   MRN: [id number]  HPI  65 year old male, patient of Dr. Lawerance Bach who  has a past medical history of Emphysema; Erectile dysfunction; Hyperlipemia; Polysubstance abuse; and Sleep apnea. He presents to the office today with the acute complaint of right sided headache. He reports that over the last two and a half weeks he has been waking up with right sided headaches. The headaches feels as though it is a "throbbing pain". The pain is always on the right side. When he takes advil or tylenol and the pain resolves.   He does report that he wears a CPAP at night but between that and his PTSD he does not feel like he is sleeping well.   He had an eye exam recently and had his eye glass prescription changed. He is waiting to get his glasses from the [place]. He does endorse blurred vision   Denies any n/v/d, tearing or eye pain. Does have photophobia   Review of Systems  Constitutional: Negative.   Eyes: Positive for photophobia and visual disturbance (blurred vision ). Negative for pain, discharge, redness and itching.  Respiratory: Negative.   Cardiovascular: Negative.   Gastrointestinal: Negative.   Endocrine: Negative.   Genitourinary: Negative.   Neurological: Positive for headaches. Negative for dizziness, tremors, seizures, syncope, facial asymmetry, speech difficulty, weakness, light-headedness and numbness.  Psychiatric/Behavioral: Positive for sleep disturbance.  All other systems reviewed and are negative.  Past Medical History:  Diagnosis Date  . Emphysema   . Erectile dysfunction   . Hyperlipemia   . Polysubstance abuse    stopped using crack/cocaine December 4th 20-14  . Sleep apnea     Social History   Social History  . Marital status: Divorced    Spouse name: N/A  . Number of children: 4  . Years of education: N/A   Occupational History  . chef - retired    Social History Main Topics  .  Smoking status: Former Smoker    Packs/day: 1.50    Years: 50.00    Types: Cigarettes    Start date: 04/26/1963    Quit date: 09/22/2013  . Smokeless tobacco: Never Used     Comment: patient using the patch, losenges, and medication  . Alcohol use No     Comment: He was drinking 3-4 drinks nightly x 40 years, quit in 2014.    . Drug use: No     Comment: history of crack use and drug tested weekly at the [place]  . Sexual activity: Not on file   Other Topics Concern  . Not on file   Social History Narrative   No regular exercise   epworth sleepiness scale = 13 (06/12/15)   Lives alone in a one story home.  Has 4 children and 9 grandchildren.  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  Wants to keep working but it is hard for him to stand.  Education: 14 years and 20 years of [employer].      Past Surgical History:  Procedure Laterality Date  . LEFT HEART CATHETERIZATION WITH CORONARY ANGIOGRAM N/A 07/15/2014   Procedure: LEFT HEART CATHETERIZATION WITH CORONARY ANGIOGRAM;  Surgeon: Lennette Bihari, MD;  Location: [facility] CATH LAB;  Service: Cardiovascular;  Laterality: N/A;    Family History  Problem Relation Age of Onset  . Diabetes Mother     Deceased  . Heart disease Father  Deceased  . Heart attack Father     x5  . Cancer - Other Sister   . Healthy Son   . Healthy Daughter   . Heart attack Brother     Deceased  . Alcohol abuse Brother     Deceased    No Known Allergies  Current Outpatient Prescriptions on File Prior to Visit  Medication Sig Dispense Refill  . albuterol (PROVENTIL HFA;VENTOLIN HFA) 108 (90 BASE) MCG/ACT inhaler Inhale 2 puffs into the lungs every 6 (six) hours as needed for shortness of breath.    Marland Kitchen. albuterol (PROVENTIL) (2.5 MG/3ML) 0.083% nebulizer solution Take 2.5 mg by nebulization 3 (three) times daily.     Marland Kitchen. albuterol-ipratropium (COMBIVENT) 18-103 MCG/ACT inhaler Inhale 1 puff into the lungs every 6 (six) hours as needed for wheezing. 1 Inhaler 5  . aspirin 81  MG tablet Take 81 mg by mouth daily.    Marland Kitchen. atorvastatin (LIPITOR) 80 MG tablet Take 80 mg by mouth at bedtime.    . budesonide-formoterol (SYMBICORT) 160-4.5 MCG/ACT inhaler Inhale 2 puffs into the lungs 2 (two) times daily. 1 Inhaler 5  . cyclobenzaprine (FLEXERIL) 10 MG tablet Take 10 mg by mouth 3 (three) times daily as needed for muscle spasms.    Marland Kitchen. glucose blood (COOL BLOOD GLUCOSE TEST STRIPS) test strip Used to test blood sugar x2 daily---diagnosis code r73.03--for one touch verio flex 200 each 3  . ipratropium (ATROVENT) 0.02 % nebulizer solution Take 0.5 mg by nebulization 3 (three) times daily.    . Lancets MISC Used to test blood sugar x2 daily---diagnosis code r73.03--for one touch verio flex 200 each 3  . MIRTAZAPINE PO Take by mouth at bedtime.    . NON FORMULARY at bedtime. [facility]    . nortriptyline (PAMELOR) 10 MG capsule Take 1 capsule (10 mg total) by mouth at bedtime. 30 capsule 5  . sildenafil (VIAGRA) 100 MG tablet Take 100 mg by mouth daily as needed for erectile dysfunction.    . Tiotropium Bromide Monohydrate (SPIRIVA RESPIMAT IN) Inhale 2 puffs into the lungs daily.     . valsartan (DIOVAN) 80 MG tablet Take 1 tablet (80 mg total) by mouth daily. 30 tablet 3   No current facility-administered medications on file prior to visit.     BP 110/70   Temp 98.4 F (36.9 C) (Oral)   Ht 5\' 6"  (1.676 m)   Wt 167 lb 1.6 oz (75.8 kg)   BMI 26.97 kg/m       Objective:   Physical Exam  Constitutional: He is oriented to person, place, and time. He appears well-developed and well-nourished. No distress.  HENT:  Head: Normocephalic and atraumatic.  Right Ear: External ear normal.  Left Ear: External ear normal.  Nose: Nose normal.  Mouth/Throat: Oropharynx is clear and moist. No oropharyngeal exudate.  Eyes: Conjunctivae and EOM are normal. Pupils are equal, round, and reactive to light. Right eye exhibits no discharge. Left eye exhibits no discharge.  No scleral icterus.  Neck: Normal range of motion. Neck supple. No JVD present. No tracheal deviation present. No thyromegaly present.  Cardiovascular: Normal rate, regular rhythm, normal heart sounds and intact distal pulses.  Exam reveals no gallop.   No murmur heard. Pulmonary/Chest: Effort normal and breath sounds normal. No stridor. No respiratory distress. He has no wheezes. He has no rales. He exhibits no tenderness.  Musculoskeletal: Normal range of motion. He exhibits no edema, tenderness or deformity.  Lymphadenopathy:  He has no cervical adenopathy.  Neurological: He is alert and oriented to person, place, and time.  Skin: Skin is warm and dry. No rash noted. He is not diaphoretic. No erythema. No pallor.  Psychiatric: He has a normal mood and affect. His behavior is normal. Judgment and thought content normal.  Nursing note and vitals reviewed.     Assessment & Plan:  1. Migraine without aura and with status migrainosus, not intractable - Possibly from lack of sleep and/or sleep apnea. Asked to follow up with his pulmonologist about this.  - Stay hydrated - ketorolac (TORADOL) injection 60 mg; Inject 2 mLs (60 mg total) into the muscle once. - Excedrin Migraine - Change pillows - Follow up in 2 weeks with PCP if no improvement - Consider MRI at that time due to being over 61 and relatively new headache

## 2016-03-08 ENCOUNTER — Telehealth: Payer: Self-pay | Admitting: Emergency Medicine

## 2016-03-08 DIAGNOSIS — R519 Headache, unspecified: Secondary | ICD-10-CM

## 2016-03-08 DIAGNOSIS — R51 Headache: Principal | ICD-10-CM

## 2016-03-08 NOTE — Telephone Encounter (Signed)
Pt called and states he saw the doctor you recommended him to. He is still having headaches and wants to know if he needs an MRI done? Please advise thanks.

## 2016-03-08 NOTE — Telephone Encounter (Signed)
Please advise 

## 2016-03-10 NOTE — Addendum Note (Signed)
Addended by: [name] on: 03/10/2016 09:48 AM   Modules accepted: Orders

## 2016-03-10 NOTE — Telephone Encounter (Signed)
He saw Kandee Keen for an acute visit, but that was that was for an acute visit.  We can check an MRI - ordered.

## 2016-03-30 ENCOUNTER — Ambulatory Visit
Admission: RE | Admit: 2016-03-30 | Discharge: 2016-03-30 | Disposition: A | Discharge status: Home / Self Care | Payer: Medicare Other | Source: Ambulatory Visit | Attending: Internal Medicine | Admitting: Internal Medicine

## 2016-03-30 DIAGNOSIS — R519 Headache, unspecified: Secondary | ICD-10-CM

## 2016-03-30 DIAGNOSIS — R51 Headache: Principal | ICD-10-CM

## 2016-05-02 LAB — PULMONARY FUNCTION TEST

## 2016-05-03 ENCOUNTER — Encounter: Payer: Self-pay | Admitting: Pulmonary Disease

## 2016-05-03 ENCOUNTER — Ambulatory Visit (INDEPENDENT_AMBULATORY_CARE_PROVIDER_SITE_OTHER): Payer: Medicare Other | Admitting: Pulmonary Disease

## 2016-05-03 VITALS — BP 130/78 | HR 67 | Temp 97.4°F | Ht 65.5 in | Wt 171.5 lb

## 2016-05-03 DIAGNOSIS — G4733 Obstructive sleep apnea (adult) (pediatric): Secondary | ICD-10-CM | POA: Diagnosis not present

## 2016-05-03 DIAGNOSIS — F4310 Post-traumatic stress disorder, unspecified: Secondary | ICD-10-CM | POA: Diagnosis not present

## 2016-05-03 DIAGNOSIS — J432 Centrilobular emphysema: Secondary | ICD-10-CM | POA: Diagnosis not present

## 2016-05-03 DIAGNOSIS — Z9989 Dependence on other enabling machines and devices: Secondary | ICD-10-CM | POA: Diagnosis not present

## 2016-05-03 NOTE — Patient Instructions (Signed)
Today we updated your med list in our EPIC system...    Continue your current medications the same...  Continue your NEBULIZER w/ Albuterol AND Ipratropium THREE times daily...  Followed by your SYMBICORT- 2 sprays twice daily & the [facility] 2 sprays once daily as we discussed...  The Combivent is your rescue inhaler...  Continue your CPAP w/ Oxygen per the [facility] recommendations each night...  It looks like you qualify for PORTABLE OXYGEN w/ activity. Exercise => the [facility] is planning an additional test soon to get you the portable system you need...  We reviewed the [facility] PFTs => it shows severe airflow obstruction c/w EMPHYSEMA (but this test shows a nice improvement from 75yr ago on your current medication regimen) >> ie- Stage4 COPD then to [facility] COPD now...  Call for any questions...  Let's plan a follow up visit in 77mo, sooner if needed for problems.Marland KitchenMarland Kitchen

## 2016-05-28 ENCOUNTER — Emergency Department (HOSPITAL_COMMUNITY): Payer: Medicare Other

## 2016-05-28 ENCOUNTER — Encounter (HOSPITAL_COMMUNITY): Payer: Self-pay | Admitting: *Deleted

## 2016-05-28 ENCOUNTER — Emergency Department (HOSPITAL_COMMUNITY)
Admission: EM | Admit: 2016-05-28 | Discharge: 2016-05-28 | Disposition: A | Discharge status: Home / Self Care | Payer: Medicare Other | Attending: Emergency Medicine | Admitting: Emergency Medicine

## 2016-05-28 DIAGNOSIS — Z7982 Long term (current) use of aspirin: Secondary | ICD-10-CM | POA: Insufficient documentation

## 2016-05-28 DIAGNOSIS — J449 Chronic obstructive pulmonary disease, unspecified: Secondary | ICD-10-CM | POA: Insufficient documentation

## 2016-05-28 DIAGNOSIS — I10 Essential (primary) hypertension: Secondary | ICD-10-CM | POA: Insufficient documentation

## 2016-05-28 DIAGNOSIS — R079 Chest pain, unspecified: Secondary | ICD-10-CM | POA: Insufficient documentation

## 2016-05-28 DIAGNOSIS — Z87891 Personal history of nicotine dependence: Secondary | ICD-10-CM | POA: Diagnosis not present

## 2016-05-28 HISTORY — DX: Essential (primary) hypertension: I10

## 2016-05-28 HISTORY — DX: Post-traumatic stress disorder, unspecified: F43.10

## 2016-05-28 LAB — CBC
HCT: 38.6 % — ABNORMAL LOW (ref 39.0–52.0)
Hemoglobin: 12.7 g/dL — ABNORMAL LOW (ref 13.0–17.0)
MCH: 29.6 pg (ref 26.0–34.0)
MCHC: 32.9 g/dL (ref 30.0–36.0)
MCV: 90 fL (ref 78.0–100.0)
PLATELETS: 223 10*3/uL (ref 150–400)
RBC: 4.29 MIL/uL (ref 4.22–5.81)
RDW: 14.5 % (ref 11.5–15.5)
WBC: 4.6 10*3/uL (ref 4.0–10.5)

## 2016-05-28 LAB — BASIC METABOLIC PANEL
Anion gap: 10 (ref 5–15)
BUN: 19 mg/dL (ref 6–20)
CALCIUM: 9.3 mg/dL (ref 8.9–10.3)
CO2: 25 mmol/L (ref 22–32)
CREATININE: 1.52 mg/dL — AB (ref 0.61–1.24)
Chloride: 108 mmol/L (ref 101–111)
GFR calc non Af Amer: 47 mL/min — ABNORMAL LOW (ref >60)
GFR, EST AFRICAN AMERICAN: 54 mL/min — AB (ref >60)
Glucose, Bld: 129 mg/dL — ABNORMAL HIGH (ref 65–99)
Potassium: 3.7 mmol/L (ref 3.5–5.1)
SODIUM: 143 mmol/L (ref 135–145)

## 2016-05-28 LAB — I-STAT TROPONIN, ED
TROPONIN I, POC: 0 ng/mL (ref 0.00–0.08)
Troponin i, poc: 0 ng/mL (ref 0.00–0.08)

## 2016-05-28 MED ORDER — IPRATROPIUM-ALBUTEROL 0.5-2.5 (3) MG/3ML IN SOLN
3.0000 mL | Freq: Once | RESPIRATORY_TRACT | Status: AC
  Administered 2016-05-28: 3 mL via RESPIRATORY_TRACT
  Filled 2016-05-28: qty 3

## 2016-05-28 MED ORDER — PREDNISONE 20 MG PO TABS
40.0000 mg | ORAL_TABLET | Freq: Once | ORAL | Status: AC
  Administered 2016-05-28: 40 mg via ORAL
  Filled 2016-05-28: qty 2

## 2016-05-28 MED ORDER — PREDNISONE 20 MG PO TABS
40.0000 mg | ORAL_TABLET | Freq: Every day | ORAL | 0 refills | Status: DC
Start: 2016-05-28 — End: 2016-08-17

## 2016-05-28 MED ORDER — FAMOTIDINE 20 MG PO TABS
20.0000 mg | ORAL_TABLET | Freq: Once | ORAL | Status: AC
  Administered 2016-05-28: 20 mg via ORAL
  Filled 2016-05-28: qty 1

## 2016-05-28 MED ORDER — ACETAMINOPHEN 325 MG PO TABS
650.0000 mg | ORAL_TABLET | Freq: Once | ORAL | Status: AC
  Administered 2016-05-28: 650 mg via ORAL
  Filled 2016-05-28: qty 2

## 2016-05-28 MED ORDER — ALUM & MAG HYDROXIDE-SIMETH 200-200-20 MG/5ML PO SUSP
15.0000 mL | Freq: Once | ORAL | Status: AC
  Administered 2016-05-28: 15 mL via ORAL
  Filled 2016-05-28: qty 30

## 2016-05-28 NOTE — Discharge Instructions (Signed)
Please take albuterol every 4 hours as needed, prednisone once a day for 5 days, seek medical attention for increased cough, fever or shortness of breath. You should follow-up with your doctor on Monday for repeat exam.

## 2016-05-28 NOTE — ED Triage Notes (Signed)
Pt c/o mid & L sided CP onset 9am today, pt denies SOB, n/v/d, pt denies Cardiac hx, pt has HTN, A&O x4

## 2016-05-28 NOTE — ED Provider Notes (Signed)
[facility]-EMERGENCY DEPT Provider Note   CSN: [id number] Arrival date & time: 05/28/16  1320     History   Chief Complaint Chief Complaint  Patient presents with  . Chest Pain    HPI Gerald Williams is a 66 y.o. male.  Patient c/o mid chest pain onset this AM at rest. Mid chest. Pain constant, dull, moderate, non radiating. Occurs at rest. No relation to activity or exertion. No associated sob, nv or diaphoresis.  No other recent cp or discomfort, even with activity or exertion. Denies cough or uri c/o. No fever or chills. No heartburn. Denies leg pain or swelling. No recent immobility, trauma, surgery or travel. No hx of dvt or pe. No pleuritic pain. No sob or unusual doe. Non smoker. Denies hx cad. unspec fam hx cad. States had negative cath in past. Denies chest wall injury or strain.    The history is provided by the patient.  Chest Pain   Pertinent negatives include no abdominal pain, no back pain, no fever, no headaches, no palpitations and no shortness of breath.    Past Medical History:  Diagnosis Date  . Emphysema   . Erectile dysfunction   . Hyperlipemia   . Hypertension   . Polysubstance abuse    stopped using crack/cocaine December 4th 20-14  . PTSD (post-traumatic stress disorder)   . Sleep apnea     Patient Active Problem List   Diagnosis Date Noted  . Blood in stool 10/05/2015  . Essential hypertension 06/18/2015  . Cephalalgia 06/18/2015  . Dyslipidemia 06/12/2015  . COPD mixed type (HCC) 05/28/2015  . Peripheral neuropathy (HCC) 05/20/2015  . COPD (chronic obstructive pulmonary disease) with emphysema (HCC) 05/20/2015  . PTSD (post-traumatic stress disorder) 05/20/2015  . Right carotid bruit 05/20/2015  . Frequent urination 05/20/2015  . Hematuria 07/15/2014  . History of tobacco abuse:  quit in ~ 06/2013  07/14/2014  . OSA on CPAP 07/14/2014  . Pre-diabetes 07/14/2014  . Claudication, R>L 07/14/2014  . Cocaine use, history of 03/26/2011  . Chest pain  03/26/2011    Past Surgical History:  Procedure Laterality Date  . LEFT HEART CATHETERIZATION WITH CORONARY ANGIOGRAM N/A 07/15/2014   Procedure: LEFT HEART CATHETERIZATION WITH CORONARY ANGIOGRAM;  Surgeon: Lennette Bihari, MD;  Location: [facility] CATH LAB;  Service: Cardiovascular;  Laterality: N/A;       Home Medications    Prior to Admission medications   Medication Sig Start Date End Date Taking? Authorizing Provider  albuterol (PROVENTIL HFA;VENTOLIN HFA) 108 (90 BASE) MCG/ACT inhaler Inhale 2 puffs into the lungs every 6 (six) hours as needed for shortness of breath.   Yes Historical Provider, MD  albuterol (PROVENTIL) (2.5 MG/3ML) 0.083% nebulizer solution Take 2.5 mg by nebulization 3 (three) times daily.    Yes Historical Provider, MD  albuterol-ipratropium (COMBIVENT) 18-103 MCG/ACT inhaler Inhale 1 puff into the lungs every 6 (six) hours as needed for wheezing. 08/18/15  Yes Michele Mcalpine, MD  aspirin 81 MG tablet Take 81 mg by mouth daily.   Yes Historical Provider, MD  atorvastatin (LIPITOR) 80 MG tablet Take 80 mg by mouth at bedtime.   Yes Historical Provider, MD  budesonide-formoterol (SYMBICORT) 160-4.5 MCG/ACT inhaler Inhale 2 puffs into the lungs 2 (two) times daily. 08/18/15  Yes Michele Mcalpine, MD  cyclobenzaprine (FLEXERIL) 10 MG tablet Take 10 mg by mouth 3 (three) times daily as needed for muscle spasms.   Yes Historical Provider, MD  ipratropium (ATROVENT) 0.02 % nebulizer solution  Take 0.5 mg by nebulization 3 (three) times daily.   Yes Historical Provider, MD  lisinopril (PRINIVIL,ZESTRIL) 20 MG tablet Take 20 mg by mouth daily.   Yes Historical Provider, MD  MIRTAZAPINE PO Take 1 tablet by mouth at bedtime.    Yes Historical Provider, MD  nortriptyline (PAMELOR) 10 MG capsule Take 1 capsule (10 mg total) by mouth at bedtime. 09/23/15  Yes Donika K Patel, DO  sildenafil (VIAGRA) 100 MG tablet Take 100 mg by mouth daily as needed for erectile dysfunction.   Yes Historical  Provider, MD  Tiotropium Bromide Monohydrate (SPIRIVA RESPIMAT IN) Inhale 2 puffs into the lungs daily.    Yes Historical Provider, MD  valsartan (DIOVAN) 80 MG tablet Take 1 tablet (80 mg total) by mouth daily. 05/26/15  Yes [name], MD  glucose blood (COOL BLOOD GLUCOSE TEST STRIPS) test strip Used to test blood sugar x2 daily---diagnosis code r73.03--for one touch verio flex 12/08/15   [name], MD  Lancets MISC Used to test blood sugar x2 daily---diagnosis code r73.03--for one touch verio flex 12/08/15   [name], MD  NON FORMULARY at bedtime. CPAP w/oxygen - [facility]    Historical Provider, MD    Family History Family History  Problem Relation Age of Onset  . Diabetes Mother     Deceased  . Heart disease Father     Deceased  . Heart attack Father     x5  . Cancer - Other Sister   . Healthy Son   . Healthy Daughter   . Heart attack Brother     Deceased  . Alcohol abuse Brother     Deceased    Social History Social History  Substance Use Topics  . Smoking status: Former Smoker    Packs/day: 1.50    Years: 50.00    Types: Cigarettes    Start date: 04/26/1963    Quit date: 09/22/2013  . Smokeless tobacco: Never Used     Comment: patient using the patch, losenges, and medication  . Alcohol use No     Comment: He was drinking 3-4 drinks nightly x 40 years, quit in 2014.       Allergies   Patient has no known allergies.   Review of Systems Review of Systems  Constitutional: Negative for fever.  HENT: Negative for sore throat.   Eyes: Negative for redness.  Respiratory: Negative for shortness of breath.   Cardiovascular: Positive for chest pain. Negative for palpitations and leg swelling.  Gastrointestinal: Negative for abdominal pain.  Genitourinary: Negative for flank pain.  Musculoskeletal: Negative for back pain and neck pain.  Skin: Negative for rash.  Neurological: Negative for headaches.  Hematological: Does not bruise/bleed easily.    Psychiatric/Behavioral: Negative for confusion.     Physical Exam Updated Vital Signs BP 136/96   Pulse (!) 59   Temp 98.3 F (36.8 C) (Oral)   Resp 21   SpO2 97%   Physical Exam  Constitutional: He appears well-developed and well-nourished. No distress.  HENT:  Mouth/Throat: Oropharynx is clear and moist.  Eyes: Conjunctivae are normal.  Neck: Neck supple. No tracheal deviation present.  Cardiovascular: Normal rate, regular rhythm, normal heart sounds and intact distal pulses.  Exam reveals no gallop and no friction rub.   No murmur heard. Pulmonary/Chest: Effort normal and breath sounds normal. No accessory muscle usage. No respiratory distress. He exhibits no tenderness.  Abdominal: Soft. Bowel sounds are normal. He exhibits no distension. There  is no tenderness.  Musculoskeletal: He exhibits no edema or tenderness.  Neurological: He is alert.  Skin: Skin is warm and dry. He is not diaphoretic.  Psychiatric: He has a normal mood and affect.  Nursing note and vitals reviewed.    ED Treatments / Results  Labs (all labs ordered are listed, but only abnormal results are displayed) Results for orders placed or performed during the hospital encounter of 05/28/16  Basic metabolic panel  Result Value Ref Range   Sodium 143 135 - 145 mmol/L   Potassium 3.7 3.5 - 5.1 mmol/L   Chloride 108 101 - 111 mmol/L   CO2 25 22 - 32 mmol/L   Glucose, Bld 129 (H) 65 - 99 mg/dL   BUN 19 6 - 20 mg/dL   Creatinine, Ser 4.09 (H) 0.61 - 1.24 mg/dL   Calcium 9.3 8.9 - 81.1 mg/dL   GFR calc non Af Amer 47 (L) >60 mL/min   GFR calc Af Amer 54 (L) >60 mL/min   Anion gap 10 5 - 15  CBC  Result Value Ref Range   WBC 4.6 4.0 - 10.5 K/uL   RBC 4.29 4.22 - 5.81 MIL/uL   Hemoglobin 12.7 (L) 13.0 - 17.0 g/dL   HCT 91.4 (L) 78.2 - 95.6 %   MCV 90.0 78.0 - 100.0 fL   MCH 29.6 26.0 - 34.0 pg   MCHC 32.9 30.0 - 36.0 g/dL   RDW 21.3 08.6 - 57.8 %   Platelets 223 150 - 400 K/uL  I-stat troponin,  ED  Result Value Ref Range   Troponin i, poc 0.00 0.00 - 0.08 ng/mL   Comment 3           Dg Chest 2 View  Result Date: 05/28/2016 CLINICAL DATA:  Chest pain. EXAM: CHEST  2 VIEW COMPARISON:  March 13, 2015 FINDINGS: No pneumothorax. The cardiomediastinal silhouette is normal. The right lung is clear. Scarring in the lateral left lung base. Mild increased opacity more inferiorly in the left base. No other acute abnormalities. IMPRESSION: New left basilar opacity could represent infiltrate or atelectasis. Recommend follow-up to resolution. Electronically Signed   By: Gerome Sam III M.D   On: 05/28/2016 14:41     EKG  EKG Interpretation  Date/Time:  Saturday May 28 2016 13:32:27 EST Ventricular Rate:  78 PR Interval:  126 QRS Duration: 80 QT Interval:  388 QTC Calculation: 442 R Axis:   21 Text Interpretation:  Normal sinus rhythm with sinus arrhythmia Nonspecific T wave abnormality No significant change since last tracing Confirmed by Denton Lank  MD, Caryn Bee ([id number]) on 05/28/2016 2:44:57 PM       Radiology Dg Chest 2 View  Result Date: 05/28/2016 CLINICAL DATA:  Chest pain. EXAM: CHEST  2 VIEW COMPARISON:  March 13, 2015 FINDINGS: No pneumothorax. The cardiomediastinal silhouette is normal. The right lung is clear. Scarring in the lateral left lung base. Mild increased opacity more inferiorly in the left base. No other acute abnormalities. IMPRESSION: New left basilar opacity could represent infiltrate or atelectasis. Recommend follow-up to resolution. Electronically Signed   By: Gerome Sam III M.D   On: 05/28/2016 14:41    Procedures Procedures (including critical care time)  Medications Ordered in ED Medications  acetaminophen (TYLENOL) tablet 650 mg (650 mg Oral Given 05/28/16 1532)  famotidine (PEPCID) tablet 20 mg (20 mg Oral Given 05/28/16 1532)  alum & mag hydroxide-simeth (MAALOX/MYLANTA) 200-200-20 MG/5ML suspension 15 mL (15 mLs Oral Given 05/28/16 1533)  Initial Impression / Assessment and Plan / ED Course  I have reviewed the triage vital signs and the nursing notes.  Pertinent labs & imaging results that were available during my care of the patient were reviewed by me and considered in my medical decision making (see chart for details).  Reviewed nursing notes and prior charts for additional history.   Labs. Ecg. Cxr.  Will try meds for symptom relief, and plan to get delta troponin.  Pt with cardiac cath 06/2014 with no significant cad.  Currently pt chest pain free, no distress.   1630, signed out to Dr Hyacinth Meeker, patient is due for repeat troponin at 1700.    Final Clinical Impressions(s) / ED Diagnoses   Final diagnoses:  None    New Prescriptions New Prescriptions   No medications on file     Cathren Laine, MD 05/28/16 1636

## 2016-05-28 NOTE — ED Provider Notes (Signed)
On repeat exam the patient was informed of his negative troponin 2, normal EKG, it does appear that he has a slight prolonged expiratory phase and on exam has some decreased lung sounds. He is by no means in any distress and is able to speak in full sentences however he states that he has been feeling tight in his chest and having some shortness of breath at the same time. His labs and imaging look unremarkable, he will be given a DuoNeb, a dose of prednisone and will go home with prednisone for the next several days. He already has albuterol and ipratropium at home to take. The patient has expressed his understanding and is in agreement, he appears stable for discharge after nebulized treatment.  His xray has some atelectasis - he has no abnormal lung sounds on my exam His WBC are normal - he denies new cough though has some mild chronic cough - nothing has been worse.  No antibitoics indicated,  Pt in agreement.       Eber Hong, MD 05/28/16 [phone]

## 2016-06-19 NOTE — Progress Notes (Signed)
Subjective:     Patient ID: Gerald Williams, male   DOB: 1950-06-10, 66 y.o.   MRN: [id number]  HPI  ~  May 28, 2015:  Initial pulmonary evaluation by SN>   10 y/o BM referred by Dr.Stacey Williams, [facility], for a pulmonary evaluation> He is also followed by the [place] but we do not have any of their records...      He was told he has severe emphysema by the [facility] but he doesn't know any details of his eval; he is an ex-smoker & quit 2 yrs ago;  He notes SOB w/ min activities, sl cough, denies sputum/ hemoptysis/ etc;  He says he was prev [facility] but [facility];  He is treated w/ Symbicort160- 2spBid and NEBULIZER w/ Albut & IpratropiumQid (he averages 1-2 per day)... He also has OSA evaluated at the [facility] & is on CPAP nightly "set 9-13 Auto" he says & he affirms that it is working well, rests well, wakes refreshed and no daytime sleepiness issues; he uses Home O2 bled into his CPAP Qhs...   Smoking Hx>  He is an ex-smoker, starting in his teens, smoked for 50 yrs up to 1+ppd, and quit 2 yrs ago at age 55; est ~60 pack-year smoking hx...  Pulmonary Hx> Hx COPD/ emphysema & [facility] [place]...  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  Current Meds>  Symbicort160-2spBid but only using prn, NEBS w/ Albut & CombiventHFA rescue inhaler prn, ASA81, Diovan80, Lipitor80, Lyrica50Tid, Flexeril10 prn... EXAM shows Afeb, VSS, O2sat=95% on RA at rest;  HEENT- neg, mallampati2;  Chest- decr BS at bases, clear w/o w/r/r;  Heart- RR, distant heart sounds w/o m/r/g;  Abd- soft, neg;  Ext- decr sensation c/w neuropathy w/o c/c/e...  Note from [facility] 03/2014  indicated 42minWT= 1106 feet w/ 0 breaks & lowest O2sat=94% on 4L/min O2 & heart rate 103/min (he participated in rehab 11/2013=>03/2014)  CTAngio Chest 03/13/15>  NEG for PE, COPD & mild posterior segmental atx, no adenopathy, effusion, pneumothorax, etc...  CXR 03/13/15 showed hyperinflation & attenuation of the pulm markings, scarring in lingula, otherw clear/ NAD.Marland KitchenMarland Kitchen   EKG 03/13/15 showed NSR, rate67, NAD...  Spirometry 05/28/15>  FVC=1.35 (40%), FEV1=0.56 (21%), %1sec=42, mid-flows reduced at 7% predicted;  This is c/w severe airflow obstruction and GOLD Stage4 COPD  Ambulatory Oximetry on RA 05/28/15>  O2sat=96% on RA at rest w/ pulse=67/min;  He ambulated 3 laps w/ lowest O2sat=89% w/ heart rate up to 99/min...  LABS 04/2015 in Epic>  Chems- ok x Cr=1.4, A1c=6.2;  CBC- wnl...                       CXR 03/13/15  IMP/PLAN>>  Gerald Williams has severe COPD/emphysema w/ very severe airflow obstruction on his spirometry c/w GOLD Stage4 disease;  He is on a reasonable regimen if he'll take it regularly and the meds are avail to him thru the [place] system- rec that he use the SYMBICORT160- 2sp Bid and the DUONEB Bid spacing the treatments out 4 times daily (see AVS);  He needs to stay off  all tobacco products, stay active w/ exercise program, and avoid infections;  He will call me for any issues w/ his breathing & we will plan a f/u in 45mo...  ~  Aug 26, 2015:  45mo ROV w/ SN>  Gerald Williams is an ex-smoker, quit 2 yrs ago w/ a 60+pack-yr hx, & severe GOLD Stage 4 COPD w/ FEV1=0.56 (21%) Feb2017=> we adjusted his bronchodil regimen w/ NEBULIZER W? Duoneb Bid & SYMBICORT160-2spBid (gets meds from the [place]);  We discussed avoiding infections, and incr exercise;  He feels that his breathing is worse & he is going thru his rescue inhaler Q3wks;  Notes incr cough & some greenish sput over the last few weeks;  He has midsternal CP & a HA each AM that resolves after he takes his BP meds;  He reports a fall last week 7 has a follow up appt  w/ Neurology coming up (neuropathy)...     Severe COPD/emphysema w/ GOLD Stage 4 disease and FEV1=0.56 (21%)>  He was prev on HomeO2 but the [facility] stopped it;     Ex-smoker w/ 60+pack-yr hx smoking and he quit 2015    OSA on CPAP nightly "set 9-13 Auto">  eval and Rx from the [place] & we do not have their data but he confirms good compliance & efficacy- rests well, no daytime hypersomnolence, O2 is bled into the CPAP Qhs by his hx.    Medical issues>  HBP, HL, prediabetes, CATH 07/15/14 by Gerald Williams showed norm coronaries & norm LV w/ EF=60%; C Bruit, Neuropathy, Hx polysubstance abuse- cocaine/ alcohol, & PTSD EXAM shows Afeb, VSS, O2sat=95% on RA at rest;  HEENT- neg, mallampati2;  Chest- decr BS at bases, clear w/o w/r/r;  Heart- RR, distant heart sounds w/o m/r/g;  Abd- soft, neg;  Ext- decr sensation c/w neuropathy w/o c/c/e... IMP/PLAN>>  We need to maximize Gerald Williams's bronchodil regimen- increase NEB w/ DUONEB to TID (VA gives him separate Albut+Ipratropium), continue SYMBICORT160-2spBid, add INCRUSE once daily;  He notes that PTSD is under better control...  ~  December 21, 2015:  67mo ROV & pulm follow up>  Gerald Williams tells me that he is applying for VA disability & he had me write a letter on his behalf because he has severe COPD, was a cigarwette smoker, and exposed to agent orange & cooking fumes while in [place]; he has Stage 4 COPD/ emphysema and OSA evaluated & treated by the [place]... We reviewed the following medical problems during today's office visit >>     Severe COPD/emphysema w/ GOLD Stage 4 disease and FEV1=0.56 (21%)>  He was prev on HomeO2 but the [facility] stopped it; currently taking NEBS w/ Duoneb Tid, followed by Symbicort160-2spBid & Spiriva Respimat-2sp once daily (of course he gets his meds from the [place]; he tells me he exercises in a Gym at a slow pace several days per wk...    Ex-smoker w/ 60+pack-yr hx smoking and he quit 2015.    OSA on CPAP nightly "set 9-13 Auto", & Nocturnal hypoxemia>  eval and Rx from  the [place] & we do not have their data but he confirms good compliance & efficacy- rests well, no daytime hypersomnolence, O2 is bled into the CPAP Qhs by his hx.    Medical issues> his PCP is DrSBurns- HBP (on diovan80), HL (on Lip80), prediabetes, CATH 07/15/14 by Gerald Williams showed norm coronaries & norm LV w/ EF=60%; C Bruit, Neuropathy, Hx polysubstance abuse- cocaine/ alcohol, & PTSD... He had a recent ROV w/ DrBurns 12/16/15 (note  reviewed by me in epic). EXAM shows Afeb, VSS, O2sat=95% on RA at rest;  HEENT- neg, mallampati2;  Chest- decr BS at bases, clear w/o w/r/r;  Heart- RR, distant heart sounds w/o m/r/g;  Abd- soft, neg;  Ext- decr sensation c/w neuropathy w/o c/c/e...  LABS 5-6/12/17 (reviewed by me in epic) showed Chems- wnl;  Low Thiamine level... IMP/PLAN>>  Gerald Williams has severe COPD/ emphysema & OSA w/ nocturnal hypoxemia; a lot of his effort these days is going into a quest for disability from the [place]; we reviewed the severity of his lung dis & the need for regular dosing of his meds, regular exercise, and the regular use of his CPAP & nocturnal O2... Note: >50% of this 25 min visit was spent in counseling & coordination of care... We plan an ROV recheck in 57mo, sooner if needed prn...   ~  May 03, 2016:  4-50mo ROV & add-on appt requested to discuss P[facility]>  Gerald Williams has Stage4 COPD/emphysema w/ a baseline FEV1=0.56L (21% predicted)- on Home O2 per the [place], NEBs w/ Albut & Ipratrpium TID followed by Symbicort160-2spBid & Spiriva Respimat-2 inhalations daily;  He is also on CPAP per the [place] but we don't have any of their notes/ data; his PCP is in [place] & his pulm specialist is also in [place];  He had these breathing tests ordered by the [place] for his "compensation & pension" hearings... He recently had Full PFTs done at the [facility] & brought those here for my interpretation & review... See prob list above>  NOTE: he prev did 6 classes at [facility] but he states that he is NOT able to exercise  & had to quit;  He feels that all this is related to his time in [place] & PTSD...  EXAM shows Afeb, VSS, O2sat=95% on RA at rest;  HEENT- neg, mallampati2;  Chest- decr BS at bases, clear w/o w/r/r;  Heart- RR, distant heart sounds w/o m/r/g;  Abd- soft, neg;  Ext- decr sensation c/w neuropathy w/o c/c/e...  P[facility]s [facility]/02/[facility] at [facility]>  FVC=2.03 (52%), FEV1=1.00 (36%), %1sec=49, mid-flows reduced at 17% predicted; no improvement in FEV1 after bronchodil- TLC=4.34 (77%), RV=2.01 (93%), RV/TLC=46%;  DLCO=38% predicted;  This is c/w severe airflow obstruction, GOLD Stage3 COPD w/ emphysema...  Gerald Williams tells me that he was also walked at the [place] while breathing room air & his O2 sat dropped to 81% IMP/PLAN>>  Gerald Williams has severe COPD/emphysema & the [facility] has documented both nocturnal hypoxemia and exercise induced hypoxemia- they are re-instating home oxygen therapy;  We reviewed his rec medical regimen w/ NEBS Tid followed by Symbicort & Spiriva, plus his oxygen;  We spent some time reviewing his treatment regimen and the need for a regular exercise program..Marland KitchenNote: >50% of this 30 min ROV was spent in counseling & coordination of care...    Past Medical History:  Diagnosis Date  . Emphysema   . Erectile dysfunction   . Hyperlipemia   . Hypertension   . Polysubstance abuse    stopped using crack/cocaine December 4th 20-14  . PTSD (post-traumatic stress disorder)   . Sleep apnea     Past Surgical History:  Procedure Laterality Date  . LEFT HEART CATHETERIZATION WITH CORONARY ANGIOGRAM N/A 07/15/2014   Procedure: LEFT HEART CATHETERIZATION WITH CORONARY ANGIOGRAM;  Surgeon: Lennette Bihari, MD;  Location: [facility] CATH LAB;  Service: Cardiovascular;  Laterality: N/A;    Outpatient Encounter Prescriptions as of 05/03/2016  Medication Sig  .  albuterol (PROVENTIL HFA;VENTOLIN HFA) 108 (90 BASE) MCG/ACT inhaler Inhale 2 puffs into the lungs every 6 (six) hours as needed for shortness of breath.  Marland Kitchen albuterol (PROVENTIL)  (2.5 MG/3ML) 0.083% nebulizer solution Take 2.5 mg by nebulization 3 (three) times daily.   Marland Kitchen albuterol-ipratropium (COMBIVENT) 18-103 MCG/ACT inhaler Inhale 1 puff into the lungs every 6 (six) hours as needed for wheezing.  Marland Kitchen aspirin 81 MG tablet Take 81 mg by mouth daily.  Marland Kitchen atorvastatin (LIPITOR) 80 MG tablet Take 80 mg by mouth at bedtime.  . budesonide-formoterol (SYMBICORT) 160-4.5 MCG/ACT inhaler Inhale 2 puffs into the lungs 2 (two) times daily.  . cyclobenzaprine (FLEXERIL) 10 MG tablet Take 10 mg by mouth 3 (three) times daily as needed for muscle spasms.  Marland Kitchen glucose blood (COOL BLOOD GLUCOSE TEST STRIPS) test strip Used to test blood sugar x2 daily---diagnosis code r73.03--for one touch verio flex  . ipratropium (ATROVENT) 0.02 % nebulizer solution Take 0.5 mg by nebulization 3 (three) times daily.  . Lancets MISC Used to test blood sugar x2 daily---diagnosis code r73.03--for one touch verio flex  . lisinopril (PRINIVIL,ZESTRIL) 20 MG tablet Take 20 mg by mouth daily.  Marland Kitchen MIRTAZAPINE PO Take 1 tablet by mouth at bedtime.   . NON FORMULARY at bedtime. CPAP w/oxygen - [facility] at [facility]  . nortriptyline (PAMELOR) 10 MG capsule Take 1 capsule (10 mg total) by mouth at bedtime.  . sildenafil (VIAGRA) 100 MG tablet Take 100 mg by mouth daily as needed for erectile dysfunction.  . Tiotropium Bromide Monohydrate (SPIRIVA RESPIMAT IN) Inhale 2 puffs into the lungs daily.   . valsartan (DIOVAN) 80 MG tablet Take 1 tablet (80 mg total) by mouth daily.   No facility-administered encounter medications on file as of 05/03/2016.     No Known Allergies   Immunization History  Administered Date(s) Administered  . Influenza,inj,Quad PF,36+ Mos 01/11/2016  . Influenza-Unspecified 01/03/2015    Current Medications, Allergies, Past Medical History, Past Surgical History, Family History, and Social History were reviewed in [employer] record.   Review of Systems             All  symptoms NEG except where BOLDED >>  Constitutional:  F/C/S, fatigue, anorexia, unexpected weight change. HEENT:  HA, visual changes, hearing loss, earache, nasal symptoms, sore throat, mouth sores, hoarseness. Resp:  cough, sputum, hemoptysis; SOB, tightness, wheezing. Cardio:  CP, palpit, DOE, orthopnea, edema. GI:  N/V/D/C, blood in stool; reflux, abd pain, distention, gas. GU:  dysuria, freq, urgency, hematuria, flank pain, voiding difficulty. MS:  joint pain, swelling, tenderness, decr ROM; neck pain, back pain, etc. Neuro:  HA, tremors, seizures, dizziness, syncope, weakness, numbness, gait abn. Skin:  suspicious lesions or skin rash. Heme:  adenopathy, bruising, bleeding. Psyche:  confusion, agitation, sleep disturbance, hallucinations, anxiety, depression suicidal.   Objective:   Physical Exam       Vital Signs:  Reviewed...   General:  WD, WN, 66 y/o BM in NAD; alert & oriented; pleasant & cooperative... HEENT:  [place]/AT; Conjunctiva- pink, Sclera- nonicteric, EOM-wnl, PERRLA, Fundi-benign; EACs-clear, TMs-wnl; NOSE-clear; THROAT-clear & wnl.  Neck:  Supple w/ fair ROM; no JVD; normal carotid impulses w/o bruits; no thyromegaly or nodules palpated; no lymphadenopathy.  Chest:  DecrBS bilat, clear to P & A; without wheezes, rales, or rhonchi heard (diminished BS bilat) Heart:  Regular Rhythm; norm S1 & S2 without murmurs, rubs, or gallops detected. Abdomen:  Soft & nontender- no guarding or rebound; normal bowel sounds;  no organomegaly or masses palpated. Ext:  decrROM; without deformities +arthritic changes; no varicose veins, +venous insuffic, or edema;  Pulses intact w/o bruits. Neuro:  CNs II-XII intact; motor testing normal; sensory testing normal; gait normal & balance OK. Derm:  No lesions noted; no rash etc. Lymph:  No cervical, supraclavicular, axillary, or inguinal adenopathy palpated.   Assessment:      IMP >>     Severe COPD/emphysema w/ GOLD Stage 3-4 disease and  prev FEV1=0.56 (21%), now 1.00 (38%)> on O2, NEBS, Symbicort, Spiriva...    Ex-smoker w/ 60+pack-yr hx smoking and he quit 2015    OSA on CPAP nightly "set 9-13 Auto">  eval and Rx from the [place] & we do not have their data but he confirms good compliance & efficacy- rests well, no daytime hypersomnolence, O2 is bled into the CPAP Qhs by his hx.    Medical issues>  HBP, HL, prediabetes, CATH 07/15/14 by Gerald Williams showed norm coronaries & norm LV w/ EF=60%; C Bruit, Neuropathy, Hx polysubstance abuse- cocaine/ alcohol, & PTSD  PLAN>>   05/28/15>   Jaun has severe COPD/emphysema w/ very severe airflow obstruction on his spirometry c/w GOLD Stage4 disease;  He is on a reasonable regimen if he'll take it regularly and the meds are avail to him thru the [place] system- rec that he use the SYMBICORT160- 2sp Bid and the DUONEB Bid spacing the treatments out 4 times daily (see AVS);  He needs to stay off all tobacco products, stay active w/ exercise program, and avoid infections;   5/3>   We need to maximize Gerald Williams's bronchodil regimen- increase NEB w/ DUONEB to TID ([facility] gives him separate Albut+Ipratropium), continue SYMBICORT160-2spBid, add INCRUSE once daily;  He notes that PTSD is under better control... 12/21/15>   Gerald Williams has severe COPD/ emphysema & OSA w/ nocturnal hypoxemia; a lot of his effort these days is going into a quest for disability from the [place]; we reviewed the severity of his lung dis & the need for regular dosing of his meds, regular exercise, and the regular use of his CPAP & nocturnal O2. 05/03/16>   Gerald Williams has severe COPD/emphysema & the [facility] has documented both nocturnal hypoxemia and exercise induced hypoxemia- they are re-instating home oxygen therapy;  We reviewed his rec medical regimen w/ NEBS Tid followed by Symbicort & Spiriva, plus his oxygen;  We spent some time reviewing his treatment regimen and the need for a regular exercise program.     Plan:     Patient's Medications  New Prescriptions    PREDNISONE (DELTASONE) 20 MG TABLET    Take 2 tablets (40 mg total) by mouth daily.  Previous Medications   ALBUTEROL (PROVENTIL HFA;VENTOLIN HFA) 108 (90 BASE) MCG/ACT INHALER    Inhale 2 puffs into the lungs every 6 (six) hours as needed for shortness of breath.   ALBUTEROL (PROVENTIL) (2.5 MG/3ML) 0.083% NEBULIZER SOLUTION    Take 2.5 mg by nebulization 3 (three) times daily.    ALBUTEROL-IPRATROPIUM (COMBIVENT) 18-103 MCG/ACT INHALER    Inhale 1 puff into the lungs every 6 (six) hours as needed for wheezing.   ASPIRIN 81 MG TABLET    Take 81 mg by mouth daily.   ATORVASTATIN (LIPITOR) 80 MG TABLET    Take 80 mg by mouth at bedtime.   BUDESONIDE-FORMOTEROL (SYMBICORT) 160-4.5 MCG/ACT INHALER    Inhale 2 puffs into the lungs 2 (two) times daily.   CYCLOBENZAPRINE (FLEXERIL) 10 MG TABLET    Take 10 mg  by mouth 3 (three) times daily as needed for muscle spasms.   GLUCOSE BLOOD (COOL BLOOD GLUCOSE TEST STRIPS) TEST STRIP    Used to test blood sugar x2 daily---diagnosis code r73.03--for one touch verio flex   IPRATROPIUM (ATROVENT) 0.02 % NEBULIZER SOLUTION    Take 0.5 mg by nebulization 3 (three) times daily.   LANCETS MISC    Used to test blood sugar x2 daily---diagnosis code r73.03--for one touch verio flex   LISINOPRIL (PRINIVIL,ZESTRIL) 20 MG TABLET    Take 20 mg by mouth daily.   MIRTAZAPINE PO    Take 1 tablet by mouth at bedtime.    NON FORMULARY    at bedtime. CPAP w/oxygen - followed at [facility]   NORTRIPTYLINE (PAMELOR) 10 MG CAPSULE    Take 1 capsule (10 mg total) by mouth at bedtime.   SILDENAFIL (VIAGRA) 100 MG TABLET    Take 100 mg by mouth daily as needed for erectile dysfunction.   TIOTROPIUM BROMIDE MONOHYDRATE (SPIRIVA RESPIMAT IN)    Inhale 2 puffs into the lungs daily.    VALSARTAN (DIOVAN) 80 MG TABLET    Take 1 tablet (80 mg total) by mouth daily.  Modified Medications   No medications on file  Discontinued Medications   No medications on file

## 2016-06-22 ENCOUNTER — Ambulatory Visit: Payer: Medicare Other | Admitting: Pulmonary Disease

## 2016-06-24 DIAGNOSIS — E1140 Type 2 diabetes mellitus with diabetic neuropathy, unspecified: Secondary | ICD-10-CM | POA: Diagnosis not present

## 2016-06-30 ENCOUNTER — Ambulatory Visit: Payer: Medicare Other | Admitting: Internal Medicine

## 2016-08-17 NOTE — Progress Notes (Signed)
Subjective:    Patient ID: Gerald Williams, male    DOB: February 06, 1951, 66 y.o.   MRN: [id number]  HPI error     Medications and allergies reviewed with patient and updated if appropriate.  Patient Active Problem List   Diagnosis Date Noted  . Blood in stool 10/05/2015  . Essential hypertension 06/18/2015  . Cephalalgia 06/18/2015  . Dyslipidemia 06/12/2015  . COPD mixed type (HCC) 05/28/2015  . Peripheral neuropathy 05/20/2015  . COPD (chronic obstructive pulmonary disease) with emphysema (HCC) 05/20/2015  . PTSD (post-traumatic stress disorder) 05/20/2015  . Right carotid bruit 05/20/2015  . Frequent urination 05/20/2015  . Hematuria 07/15/2014  . History of tobacco abuse:  quit in ~ 06/2013  07/14/2014  . OSA on CPAP 07/14/2014  . Pre-diabetes 07/14/2014  . Claudication, R>L 07/14/2014  . Cocaine use, history of 03/26/2011  . Chest pain 03/26/2011    Current Outpatient Prescriptions on File Prior to Visit  Medication Sig Dispense Refill  . albuterol (PROVENTIL HFA;VENTOLIN HFA) 108 (90 BASE) MCG/ACT inhaler Inhale 2 puffs into the lungs every 6 (six) hours as needed for shortness of breath.    Marland Kitchen albuterol (PROVENTIL) (2.5 MG/3ML) 0.083% nebulizer solution Take 2.5 mg by nebulization 3 (three) times daily.     Marland Kitchen albuterol-ipratropium (COMBIVENT) 18-103 MCG/ACT inhaler Inhale 1 puff into the lungs every 6 (six) hours as needed for wheezing. 1 Inhaler 5  . aspirin 81 MG tablet Take 81 mg by mouth daily.    Marland Kitchen atorvastatin (LIPITOR) 80 MG tablet Take 80 mg by mouth at bedtime.    . budesonide-formoterol (SYMBICORT) 160-4.5 MCG/ACT inhaler Inhale 2 puffs into the lungs 2 (two) times daily. 1 Inhaler 5  . cyclobenzaprine (FLEXERIL) 10 MG tablet Take 10 mg by mouth 3 (three) times daily as needed for muscle spasms.    Marland Kitchen glucose blood (COOL BLOOD GLUCOSE TEST STRIPS) test strip Used to test blood sugar x2 daily---diagnosis code r73.03--for one touch verio flex 200 each 3  .  ipratropium (ATROVENT) 0.02 % nebulizer solution Take 0.5 mg by nebulization 3 (three) times daily.    . Lancets MISC Used to test blood sugar x2 daily---diagnosis code r73.03--for one touch verio flex 200 each 3  . lisinopril (PRINIVIL,ZESTRIL) 20 MG tablet Take 20 mg by mouth daily.    Marland Kitchen MIRTAZAPINE PO Take 1 tablet by mouth at bedtime.     . NON FORMULARY at bedtime. CPAP w/oxygen - followed at [facility]    . nortriptyline (PAMELOR) 10 MG capsule Take 1 capsule (10 mg total) by mouth at bedtime. 30 capsule 5  . predniSONE (DELTASONE) 20 MG tablet Take 2 tablets (40 mg total) by mouth daily. 10 tablet 0  . sildenafil (VIAGRA) 100 MG tablet Take 100 mg by mouth daily as needed for erectile dysfunction.    . Tiotropium Bromide Monohydrate (SPIRIVA RESPIMAT IN) Inhale 2 puffs into the lungs daily.     . valsartan (DIOVAN) 80 MG tablet Take 1 tablet (80 mg total) by mouth daily. 30 tablet 3   No current facility-administered medications on file prior to visit.     Past Medical History:  Diagnosis Date  . Emphysema   . Erectile dysfunction   . Hyperlipemia   . Hypertension   . Polysubstance abuse    stopped using crack/cocaine December 4th 20-14  . PTSD (post-traumatic stress disorder)   . Sleep apnea     Past Surgical History:  Procedure Laterality Date  . LEFT HEART CATHETERIZATION  WITH CORONARY ANGIOGRAM N/A 07/15/2014   Procedure: LEFT HEART CATHETERIZATION WITH CORONARY ANGIOGRAM;  Surgeon: Lennette Bihari, MD;  Location: [facility] CATH LAB;  Service: Cardiovascular;  Laterality: N/A;    Social History   Social History  . Marital status: Divorced    Spouse name: N/A  . Number of children: 4  . Years of education: N/A   Occupational History  . chef - retired    Social History Main Topics  . Smoking status: Former Smoker    Packs/day: 1.50    Years: 50.00    Types: Cigarettes    Start date: 04/26/1963    Quit date: 09/22/2013  . Smokeless tobacco: Never Used     Comment: patient using  the patch, losenges, and medication  . Alcohol use No     Comment: He was drinking 3-4 drinks nightly x 40 years, quit in 2014.    . Drug use: No     Comment: history of crack use and drug tested weekly at the [place]  . Sexual activity: Not on file   Other Topics Concern  . Not on file   Social History Narrative   No regular exercise   epworth sleepiness scale = 13 (06/12/15)   Lives alone in a one story home.  Has 4 children and 9 grandchildren.  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  Wants to keep working but it is hard for him to stand.  Education: 14 years and 20 years of [employer].      Family History  Problem Relation Age of Onset  . Diabetes Mother     Deceased  . Heart disease Father     Deceased  . Heart attack Father     x5  . Cancer - Other Sister   . Healthy Son   . Healthy Daughter   . Heart attack Brother     Deceased  . Alcohol abuse Brother     Deceased    Review of Systems     Objective:  There were no vitals filed for this visit. Wt Readings from Last 3 Encounters:  05/03/16 171 lb 8 oz (77.8 kg)  02/04/16 167 lb 1.6 oz (75.8 kg)  01/01/16 172 lb (78 kg)   There is no height or weight on file to calculate BMI.   Physical Exam         Assessment & Plan:    See Problem List for Assessment and Plan of chronic medical problems.    This encounter was created in error - please disregard.

## 2016-08-18 ENCOUNTER — Encounter: Payer: Medicare Other | Admitting: Internal Medicine

## 2016-08-25 ENCOUNTER — Ambulatory Visit (INDEPENDENT_AMBULATORY_CARE_PROVIDER_SITE_OTHER): Payer: Medicare Other | Admitting: Nurse Practitioner

## 2016-08-25 ENCOUNTER — Encounter: Payer: Self-pay | Admitting: Nurse Practitioner

## 2016-08-25 VITALS — BP 130/82 | HR 66 | Temp 97.9°F | Ht 65.5 in | Wt 165.0 lb

## 2016-08-25 DIAGNOSIS — H1132 Conjunctival hemorrhage, left eye: Secondary | ICD-10-CM | POA: Diagnosis not present

## 2016-08-25 MED ORDER — POLYETHYL GLYCOL-PROPYL GLYCOL 0.4-0.3 % OP SOLN
2.0000 [drp] | OPHTHALMIC | 0 refills | Status: DC | PRN
Start: 2016-08-25 — End: 2020-09-24

## 2016-08-25 NOTE — Progress Notes (Signed)
Pre visit review using our clinic review tool, if applicable. No additional management support is needed unless otherwise documented below in the visit note. 

## 2016-08-25 NOTE — Progress Notes (Signed)
Subjective:  Patient ID: Gerald Williams, male    DOB: 11/09/1950  Age: 66 y.o. MRN: [id number]  CC: Follow-up (left eye pink--happened this morning)   Eye Problem   The left eye is affected. This is a new problem. The current episode started yesterday. The problem occurs constantly. The problem has been unchanged. There was no injury mechanism. The pain is mild. There is no known exposure to pink eye. He does not wear contacts. Associated symptoms include eye redness and a foreign body sensation. Pertinent negatives include no blurred vision, eye discharge, double vision, fever, itching, nausea, photophobia, recent URI or vomiting. He has tried nothing for the symptoms.    Outpatient Medications Prior to Visit  Medication Sig Dispense Refill  . albuterol (PROVENTIL HFA;VENTOLIN HFA) 108 (90 BASE) MCG/ACT inhaler Inhale 2 puffs into the lungs every 6 (six) hours as needed for shortness of breath.    Marland Kitchen albuterol (PROVENTIL) (2.5 MG/3ML) 0.083% nebulizer solution Take 2.5 mg by nebulization 3 (three) times daily.     Marland Kitchen albuterol-ipratropium (COMBIVENT) 18-103 MCG/ACT inhaler Inhale 1 puff into the lungs every 6 (six) hours as needed for wheezing. 1 Inhaler 5  . aspirin 81 MG tablet Take 81 mg by mouth daily.    Marland Kitchen atorvastatin (LIPITOR) 80 MG tablet Take 80 mg by mouth at bedtime.    . budesonide-formoterol (SYMBICORT) 160-4.5 MCG/ACT inhaler Inhale 2 puffs into the lungs 2 (two) times daily. 1 Inhaler 5  . cyclobenzaprine (FLEXERIL) 10 MG tablet Take 10 mg by mouth 3 (three) times daily as needed for muscle spasms.    Marland Kitchen glucose blood (COOL BLOOD GLUCOSE TEST STRIPS) test strip Used to test blood sugar x2 daily---diagnosis code r73.03--for one touch verio flex 200 each 3  . ipratropium (ATROVENT) 0.02 % nebulizer solution Take 0.5 mg by nebulization 3 (three) times daily.    . Lancets MISC Used to test blood sugar x2 daily---diagnosis code r73.03--for one touch verio flex 200 each 3  . lisinopril  (PRINIVIL,ZESTRIL) 20 MG tablet Take 20 mg by mouth daily.    Marland Kitchen MIRTAZAPINE PO Take 1 tablet by mouth at bedtime.     . NON FORMULARY at bedtime. CPAP w/oxygen - [facility] at [facility]    . nortriptyline (PAMELOR) 10 MG capsule Take 1 capsule (10 mg total) by mouth at bedtime. 30 capsule 5  . sildenafil (VIAGRA) 100 MG tablet Take 100 mg by mouth daily as needed for erectile dysfunction.    . Tiotropium Bromide Monohydrate (SPIRIVA RESPIMAT IN) Inhale 2 puffs into the lungs daily.     . valsartan (DIOVAN) 80 MG tablet Take 1 tablet (80 mg total) by mouth daily. 30 tablet 3   No facility-administered medications prior to visit.     ROS See HPI  Objective:  BP 130/82   Pulse 66   Temp 97.9 F (36.6 C)   Ht 5' 5.5" (1.664 m)   Wt 165 lb (74.8 kg)   SpO2 98%   BMI 27.04 kg/m   BP Readings from Last 3 Encounters:  08/25/16 130/82  05/28/16 134/83  05/03/16 130/78    Wt Readings from Last 3 Encounters:  08/25/16 165 lb (74.8 kg)  05/03/16 171 lb 8 oz (77.8 kg)  02/04/16 167 lb 1.6 oz (75.8 kg)    Physical Exam  Constitutional: He is oriented to person, place, and time. No distress.  HENT:  Mouth/Throat: No oropharyngeal exudate.  Eyes: EOM are normal. Pupils are equal, round, and reactive to light.  Right eye exhibits no chemosis, no discharge and no hordeolum. Left eye exhibits no chemosis, no discharge and no hordeolum. Right conjunctiva is not injected. Right conjunctiva has no hemorrhage. Left conjunctiva is not injected. Left conjunctiva has a hemorrhage.  Subconjunctival hemorrhrage on lateral aspect of left eye.  Neck: Normal range of motion. Neck supple. No thyromegaly present.  Lymphadenopathy:    He has no cervical adenopathy.  Neurological: He is alert and oriented to person, place, and time.  Skin: Skin is warm and dry. No erythema.  Vitals reviewed.   Lab Results  Component Value Date   WBC 4.6 05/28/2016   HGB 12.7 (L) 05/28/2016   HCT 38.6 (L) 05/28/2016   PLT  223 05/28/2016   GLUCOSE 129 (H) 05/28/2016   CHOL 210 (H) 05/26/2015   TRIG 62.0 05/26/2015   HDL 60.90 05/26/2015   LDLCALC 136 (H) 05/26/2015   ALT 13 01/01/2016   AST 16 01/01/2016   NA 143 05/28/2016   K 3.7 05/28/2016   CL 108 05/28/2016   CREATININE 1.52 (H) 05/28/2016   BUN 19 05/28/2016   CO2 25 05/28/2016   TSH 1.667 07/14/2014   PSA 0.49 05/26/2015   INR 1.12 07/15/2014   HGBA1C 6.2 01/01/2016    Dg Chest 2 View  Result Date: 05/28/2016 CLINICAL DATA:  Chest pain. EXAM: CHEST  2 VIEW COMPARISON:  March 13, 2015 FINDINGS: No pneumothorax. The cardiomediastinal silhouette is normal. The right lung is clear. Scarring in the lateral left lung base. Mild increased opacity more inferiorly in the left base. No other acute abnormalities. IMPRESSION: New left basilar opacity could represent infiltrate or atelectasis. Recommend follow-up to resolution. Electronically Signed   By: Gerome Sam III M.D   On: 05/28/2016 14:41    Assessment & Plan:   Maika was seen today for follow-up.  Diagnoses and all orders for this visit:  Subconjunctival hemorrhage, non-traumatic, left -     Polyethyl Glycol-Propyl Glycol 0.4-0.3 % SOLN; Apply 2 drops to eye as needed.   I am having Mr. Meno start on Polyethyl Glycol-Propyl Glycol. I am also having him maintain his albuterol, albuterol, atorvastatin, aspirin, sildenafil, cyclobenzaprine, valsartan, NON FORMULARY, MIRTAZAPINE PO, albuterol-ipratropium, budesonide-formoterol, ipratropium, nortriptyline, Tiotropium Bromide Monohydrate (SPIRIVA RESPIMAT IN), glucose blood, Lancets, and lisinopril.  Meds ordered this encounter  Medications  . Polyethyl Glycol-Propyl Glycol 0.4-0.3 % SOLN    Sig: Apply 2 drops to eye as needed.    Dispense:  15 mL    Refill:  0    Order Specific Question:   Supervising Provider    Answer:   Tresa Garter [[id number]]    Follow-up: Return if symptoms worsen or fail to improve.  Alysia Penna,  NP

## 2016-08-25 NOTE — Patient Instructions (Signed)

## 2016-09-18 ENCOUNTER — Other Ambulatory Visit: Payer: Self-pay

## 2016-09-18 ENCOUNTER — Emergency Department (HOSPITAL_COMMUNITY): Payer: Medicare Other

## 2016-09-18 ENCOUNTER — Encounter (HOSPITAL_COMMUNITY): Payer: Self-pay | Admitting: Emergency Medicine

## 2016-09-18 ENCOUNTER — Emergency Department (HOSPITAL_COMMUNITY)
Admission: EM | Admit: 2016-09-18 | Discharge: 2016-09-19 | Disposition: A | Discharge status: Home / Self Care | Payer: Medicare Other | Attending: Emergency Medicine | Admitting: Emergency Medicine

## 2016-09-18 DIAGNOSIS — I10 Essential (primary) hypertension: Secondary | ICD-10-CM | POA: Insufficient documentation

## 2016-09-18 DIAGNOSIS — R0602 Shortness of breath: Secondary | ICD-10-CM | POA: Insufficient documentation

## 2016-09-18 DIAGNOSIS — Z87891 Personal history of nicotine dependence: Secondary | ICD-10-CM | POA: Diagnosis not present

## 2016-09-18 DIAGNOSIS — Z79899 Other long term (current) drug therapy: Secondary | ICD-10-CM | POA: Diagnosis not present

## 2016-09-18 DIAGNOSIS — R079 Chest pain, unspecified: Secondary | ICD-10-CM | POA: Diagnosis not present

## 2016-09-18 DIAGNOSIS — J449 Chronic obstructive pulmonary disease, unspecified: Secondary | ICD-10-CM | POA: Diagnosis not present

## 2016-09-18 LAB — POCT I-STAT TROPONIN I: Troponin i, poc: 0 ng/mL (ref 0.00–0.08)

## 2016-09-18 NOTE — ED Triage Notes (Signed)
Pt from home with complaints of left sided chest pain that began this evening. Pt state he has been congested starting today. Pt has he has been taking tylenol and mucinex. Pt states the pain is in his left chest and does not radiate anywhere. Pt has hx of COPD. Pt has SOB that began today.Pt has strong bilateral radial pulses, clear yet diminished lung sounds, and SI and S2 heart sounds

## 2016-09-19 LAB — BASIC METABOLIC PANEL
Anion gap: 7 (ref 5–15)
BUN: 19 mg/dL (ref 6–20)
CALCIUM: 9 mg/dL (ref 8.9–10.3)
CO2: 24 mmol/L (ref 22–32)
Chloride: 112 mmol/L — ABNORMAL HIGH (ref 101–111)
Creatinine, Ser: 1.25 mg/dL — ABNORMAL HIGH (ref 0.61–1.24)
GFR calc Af Amer: >60 mL/min (ref >60)
GFR, EST NON AFRICAN AMERICAN: 59 mL/min — AB (ref >60)
Glucose, Bld: 116 mg/dL — ABNORMAL HIGH (ref 65–99)
POTASSIUM: 4.3 mmol/L (ref 3.5–5.1)
SODIUM: 143 mmol/L (ref 135–145)

## 2016-09-19 LAB — CBC
HEMATOCRIT: 42.4 % (ref 39.0–52.0)
Hemoglobin: 14.2 g/dL (ref 13.0–17.0)
MCH: 29.8 pg (ref 26.0–34.0)
MCHC: 33.5 g/dL (ref 30.0–36.0)
MCV: 89.1 fL (ref 78.0–100.0)
PLATELETS: 223 10*3/uL (ref 150–400)
RBC: 4.76 MIL/uL (ref 4.22–5.81)
RDW: 13.9 % (ref 11.5–15.5)
WBC: 8.3 10*3/uL (ref 4.0–10.5)

## 2016-09-19 MED ORDER — IPRATROPIUM BROMIDE 0.02 % IN SOLN
0.5000 mg | Freq: Once | RESPIRATORY_TRACT | Status: AC
  Administered 2016-09-19: 0.5 mg via RESPIRATORY_TRACT
  Filled 2016-09-19: qty 2.5

## 2016-09-19 MED ORDER — AMOXICILLIN-POT CLAVULANATE 875-125 MG PO TABS: 1.0000 | ORAL_TABLET | Freq: Two times a day (BID) | ORAL | 0 refills | Status: DC

## 2016-09-19 MED ORDER — LIDOCAINE 5 % EX PTCH: 1.0000 | MEDICATED_PATCH | CUTANEOUS | 0 refills | Status: DC

## 2016-09-19 MED ORDER — BENZONATATE 100 MG PO CAPS
200.0000 mg | ORAL_CAPSULE | Freq: Once | ORAL | Status: AC
  Administered 2016-09-19: 200 mg via ORAL
  Filled 2016-09-19: qty 2

## 2016-09-19 MED ORDER — LIDOCAINE 5 % EX PTCH
1.0000 | MEDICATED_PATCH | CUTANEOUS | Status: DC
  Administered 2016-09-19: 1 via TRANSDERMAL
  Filled 2016-09-19: qty 1

## 2016-09-19 MED ORDER — AMOXICILLIN-POT CLAVULANATE 875-125 MG PO TABS
1.0000 | ORAL_TABLET | Freq: Once | ORAL | Status: AC
  Administered 2016-09-19: 1 via ORAL
  Filled 2016-09-19: qty 1

## 2016-09-19 MED ORDER — ALBUTEROL SULFATE (2.5 MG/3ML) 0.083% IN NEBU
5.0000 mg | INHALATION_SOLUTION | Freq: Once | RESPIRATORY_TRACT | Status: AC
  Administered 2016-09-19: 5 mg via RESPIRATORY_TRACT
  Filled 2016-09-19: qty 6

## 2016-09-19 MED ORDER — PREDNISONE 20 MG PO TABS: 40.0000 mg | ORAL_TABLET | Freq: Every day | ORAL | 0 refills | Status: DC

## 2016-09-19 MED ORDER — HYDROCODONE-ACETAMINOPHEN 5-325 MG PO TABS: 1.0000 | ORAL_TABLET | Freq: Once | ORAL | Status: DC

## 2016-09-19 MED ORDER — PREDNISONE 20 MG PO TABS
60.0000 mg | ORAL_TABLET | Freq: Once | ORAL | Status: AC
  Administered 2016-09-19: 60 mg via ORAL
  Filled 2016-09-19: qty 3

## 2016-09-19 MED ORDER — AZITHROMYCIN 250 MG PO TABS: 500.0000 mg | ORAL_TABLET | Freq: Once | ORAL | Status: DC

## 2016-09-19 MED ORDER — BENZONATATE 100 MG PO CAPS: 100.0000 mg | ORAL_CAPSULE | Freq: Three times a day (TID) | ORAL | 0 refills | Status: DC | PRN

## 2016-09-19 NOTE — Discharge Instructions (Signed)
Take prednisone and Augmentin as prescribed. We recommend Lidoderm for pain, as needed. You may take Tessalon for cough, if desired. Follow up with your primary care doctor on Tuesday for a recheck of symptoms. You may return as needed if symptoms persist or worsen.

## 2016-09-19 NOTE — ED Provider Notes (Signed)
Medical screening examination/treatment/procedure(s) were performed by non-physician practitioner and as supervising physician I was immediately available for consultation/collaboration.   EKG Interpretation  Date/Time:  Sunday Sep 18 2016 23:40:01 EDT Ventricular Rate:  92 PR Interval:    QRS Duration: 76 QT Interval:  416 QTC Calculation: 515 R Axis:   49 Text Interpretation:  Sinus rhythm Borderline repolarization abnormality Prolonged QT interval Confirmed by Domenik Trice ([id number]) on 09/18/2016 11:44:22 PM        Mario Coronado, MD 09/19/16 2328

## 2016-09-19 NOTE — ED Provider Notes (Signed)
[facility]-EMERGENCY DEPT Provider Note   CSN: [id number] Arrival date & time: 09/18/16  2303 By signing my name below, I, Elsie Stain, attest that this documentation has been prepared under the direction and in the presence of non-physician provider, Antony Madura, PA-C. Electronically Signed: Elsie Stain, ED Scribe. 09/18/2016. 12:07 PM.   History   Chief Complaint Chief Complaint  Patient presents with  . Shortness of Breath  . Chest Pain   HPI Gerald Williams is a 66 y.o. male with a history of COPD and emphysema who presents to the Emergency Department complaining of constant, severe shortness of breath onset today. Pt reports associated left-sided chest pain, nasal congestion, and cough productive of yellow sputum. Pain is exacerbated with deep inspiration. Pt has taken Mucinex to treat PTA with no improvements. He has been compliant with his daily medications which have not alleviated his symptoms.Pt denies any other acute symptoms.  The history is provided by the patient. No language interpreter was used.   Past Medical History:  Diagnosis Date  . Emphysema   . Erectile dysfunction   . Hyperlipemia   . Hypertension   . Polysubstance abuse    stopped using crack/cocaine December 4th 20-14  . PTSD (post-traumatic stress disorder)   . Sleep apnea     Patient Active Problem List   Diagnosis Date Noted  . Blood in stool 10/05/2015  . Essential hypertension 06/18/2015  . Cephalalgia 06/18/2015  . Dyslipidemia 06/12/2015  . COPD mixed type (HCC) 05/28/2015  . Peripheral neuropathy 05/20/2015  . COPD (chronic obstructive pulmonary disease) with emphysema (HCC) 05/20/2015  . PTSD (post-traumatic stress disorder) 05/20/2015  . Right carotid bruit 05/20/2015  . Frequent urination 05/20/2015  . Hematuria 07/15/2014  . History of tobacco abuse:  quit in ~ 06/2013  07/14/2014  . OSA on CPAP 07/14/2014  . Pre-diabetes 07/14/2014  . Claudication, R>L 07/14/2014  . Cocaine use,  history of 03/26/2011  . Chest pain 03/26/2011    Past Surgical History:  Procedure Laterality Date  . LEFT HEART CATHETERIZATION WITH CORONARY ANGIOGRAM N/A 07/15/2014   Procedure: LEFT HEART CATHETERIZATION WITH CORONARY ANGIOGRAM;  Surgeon: Lennette Bihari, MD;  Location: [facility] CATH LAB;  Service: Cardiovascular;  Laterality: N/A;       Home Medications    Prior to Admission medications   Medication Sig Start Date End Date Taking? Authorizing Provider  albuterol (PROVENTIL HFA;VENTOLIN HFA) 108 (90 BASE) MCG/ACT inhaler Inhale 2 puffs into the lungs every 6 (six) hours as needed for shortness of breath.    [provider]  albuterol (PROVENTIL) (2.5 MG/3ML) 0.083% nebulizer solution Take 2.5 mg by nebulization 3 (three) times daily.     [provider]  albuterol-ipratropium (COMBIVENT) 18-103 MCG/ACT inhaler Inhale 1 puff into the lungs every 6 (six) hours as needed for wheezing. 08/18/15   Michele Mcalpine, MD  aspirin 81 MG tablet Take 81 mg by mouth daily.    [provider]  atorvastatin (LIPITOR) 80 MG tablet Take 80 mg by mouth at bedtime.    [provider]  budesonide-formoterol (SYMBICORT) 160-4.5 MCG/ACT inhaler Inhale 2 puffs into the lungs 2 (two) times daily. 08/18/15   Michele Mcalpine, MD  cyclobenzaprine (FLEXERIL) 10 MG tablet Take 10 mg by mouth 3 (three) times daily as needed for muscle spasms.    [provider]  glucose blood (COOL BLOOD GLUCOSE TEST STRIPS) test strip Used to test blood sugar x2 daily---diagnosis code r73.03--for one touch verio flex 12/08/15  [name], MD  ipratropium (ATROVENT) 0.02 % nebulizer solution Take 0.5 mg by nebulization 3 (three) times daily.    [provider]  Lancets MISC Used to test blood sugar x2 daily---diagnosis code r73.03--for one touch verio flex 12/08/15   Burns, [name], MD  lisinopril (PRINIVIL,ZESTRIL) 20 MG tablet Take 20 mg by mouth daily.    [provider]    MIRTAZAPINE PO Take 1 tablet by mouth at bedtime.     [provider]  NON FORMULARY at bedtime. CPAP w/oxygen - followed at [facility]    [provider]  nortriptyline (PAMELOR) 10 MG capsule Take 1 capsule (10 mg total) by mouth at bedtime. 09/23/15   Nita Sickle K, DO  Polyethyl Glycol-Propyl Glycol 0.4-0.3 % SOLN Apply 2 drops to eye as needed. 08/25/16   Nche, Bonna Gains, NP  sildenafil (VIAGRA) 100 MG tablet Take 100 mg by mouth daily as needed for erectile dysfunction.    [provider]  Tiotropium Bromide Monohydrate (SPIRIVA RESPIMAT IN) Inhale 2 puffs into the lungs daily.     [provider]  valsartan (DIOVAN) 80 MG tablet Take 1 tablet (80 mg total) by mouth daily. 05/26/15   [name], MD    Family History Family History  Problem Relation Age of Onset  . Diabetes Mother        Deceased  . Heart disease Father        Deceased  . Heart attack Father        x5  . Cancer - Other Sister   . Healthy Son   . Healthy Daughter   . Heart attack Brother        Deceased  . Alcohol abuse Brother        Deceased   Social History Social History  Substance Use Topics  . Smoking status: Former Smoker    Packs/day: 1.50    Years: 50.00    Types: Cigarettes    Start date: 04/26/1963    Quit date: 09/22/2013  . Smokeless tobacco: Never Used     Comment: patient using the patch, losenges, and medication  . Alcohol use No     Comment: He was drinking 3-4 drinks nightly x 40 years, quit in 2014.       Allergies   Patient has no known allergies.   Review of Systems Review of Systems All systems reviewed and are negative for acute change except as noted in the HPI.  Physical Exam Updated Vital Signs Ht 5' 5.5" (1.664 m)   Wt 165 lb (74.8 kg)   BMI 27.04 kg/m   Physical Exam  Constitutional: He is oriented to person, place, and time. He appears well-developed and well-nourished. No distress.  Nontoxic appearing. Patient in NAD.  HENT:   Head: Normocephalic and atraumatic.  Eyes: Conjunctivae and EOM are normal. No scleral icterus.  Neck: Normal range of motion.  Cardiovascular: Normal rate, regular rhythm and intact distal pulses.   Pulmonary/Chest: Effort normal. No respiratory distress. He has wheezes. He has no rales.  Diffuse faint expiratory wheezing as well as rhonchi. Chest expansion symmetric. Mild dyspnea. No tachypnea.  Musculoskeletal: Normal range of motion.  Neurological: He is alert and oriented to person, place, and time. He exhibits normal muscle tone. Coordination normal.  GCS 15. Patient moving all extremities.  Skin: Skin is warm and dry. No rash noted. He is not diaphoretic. No erythema. No pallor.  Psychiatric: He has a normal mood and  affect. His behavior is normal.  Nursing note and vitals reviewed.    ED Treatments / Results  DIAGNOSTIC STUDIES:  Oxygen Saturation is 92% on RA, low by my interpretation.    COORDINATION OF CARE:  12:10 AM Discussed treatment plan which includes Deltasone, Proventil, and Atrovent with pt at bedside and pt agreed to plan.  Labs (all labs ordered are listed, but only abnormal results are displayed) Labs Reviewed  BASIC METABOLIC PANEL - Abnormal; Notable for the following:       Result Value   Chloride 112 (*)    Glucose, Bld 116 (*)    Creatinine, Ser 1.25 (*)    GFR calc non Af Amer 59 (*)    All other components within normal limits  CBC  I-STAT TROPOININ, ED  POCT I-STAT TROPONIN I    EKG  EKG Interpretation  Date/Time:  Sunday Sep 18 2016 23:40:01 EDT Ventricular Rate:  92 PR Interval:    QRS Duration: 76 QT Interval:  416 QTC Calculation: 515 R Axis:   49 Text Interpretation:  Sinus rhythm Borderline repolarization abnormality Prolonged QT interval Confirmed by Palumbo, April ([id number]) on 09/18/2016 11:44:22 PM       Radiology Dg Chest 2 View  Result Date: 09/18/2016 CLINICAL DATA:  Acute onset of left-sided chest pain and congestion.  Shortness of breath. Chills. Initial encounter. EXAM: CHEST  2 VIEW COMPARISON:  Chest radiograph from 05/28/2016 FINDINGS: The lungs are well-aerated and clear. There is no evidence of focal opacification, pleural effusion or pneumothorax. The heart is normal in size; the mediastinal contour is within normal limits. No acute osseous abnormalities are seen. IMPRESSION: No acute cardiopulmonary process seen. Electronically Signed   By: Roanna Raider M.D.   On: 09/18/2016 23:44    Procedures Procedures (including critical care time)  Medications Ordered in ED Medications  albuterol (PROVENTIL) (2.5 MG/3ML) 0.083% nebulizer solution 5 mg (5 mg Nebulization Given 09/19/16 0043)  ipratropium (ATROVENT) nebulizer solution 0.5 mg (0.5 mg Nebulization Given 09/19/16 0043)  predniSONE (DELTASONE) tablet 60 mg (60 mg Oral Given 09/19/16 0103)  ipratropium (ATROVENT) nebulizer solution 0.5 mg (0.5 mg Nebulization Given 09/19/16 0206)  albuterol (PROVENTIL) (2.5 MG/3ML) 0.083% nebulizer solution 5 mg (5 mg Nebulization Given 09/19/16 0206)  benzonatate (TESSALON) capsule 200 mg (200 mg Oral Given 09/19/16 0159)  amoxicillin-clavulanate (AUGMENTIN) 875-125 MG per tablet 1 tablet (1 tablet Oral Given 09/19/16 0432)     Initial Impression / Assessment and Plan / ED Course  I have reviewed the triage vital signs and the nursing notes.  Pertinent labs & imaging results that were available during my care of the patient were reviewed by me and considered in my medical decision making (see chart for details).     Patient presenting for URI symptoms and SOB. CXR negative for acute infiltrate, though will start on antibiotics give hx of COPD. Patient afebrile. No hypoxia. He has had improvement in SOB with symptomatic management. Patient will be discharged with Augmentin and instructions for continued supportive care. Patient verbalizes understanding and is agreeable with plan. Patient is hemodynamically stable and in  NAD prior to discharge.   Final Clinical Impressions(s) / ED Diagnoses   Final diagnoses:  Shortness of breath    New Prescriptions New Prescriptions   No medications on file    I personally performed the services described in this documentation, which was scribed in my presence. The recorded information has been reviewed and is accurate.  Antony Madura, PA-C 09/30/16 2352    Palumbo, April, MD 10/19/16 1701

## 2016-10-31 ENCOUNTER — Ambulatory Visit: Payer: Medicare Other | Admitting: Pulmonary Disease

## 2016-11-09 ENCOUNTER — Emergency Department (HOSPITAL_COMMUNITY): Payer: Medicare Other

## 2016-11-09 ENCOUNTER — Encounter (HOSPITAL_COMMUNITY): Payer: Self-pay | Admitting: Emergency Medicine

## 2016-11-09 ENCOUNTER — Emergency Department (HOSPITAL_COMMUNITY)
Admission: EM | Admit: 2016-11-09 | Discharge: 2016-11-09 | Disposition: A | Discharge status: Home / Self Care | Payer: Medicare Other | Attending: Emergency Medicine | Admitting: Emergency Medicine

## 2016-11-09 DIAGNOSIS — I10 Essential (primary) hypertension: Secondary | ICD-10-CM | POA: Insufficient documentation

## 2016-11-09 DIAGNOSIS — Z7982 Long term (current) use of aspirin: Secondary | ICD-10-CM | POA: Diagnosis not present

## 2016-11-09 DIAGNOSIS — J439 Emphysema, unspecified: Secondary | ICD-10-CM | POA: Insufficient documentation

## 2016-11-09 DIAGNOSIS — J189 Pneumonia, unspecified organism: Secondary | ICD-10-CM | POA: Diagnosis not present

## 2016-11-09 DIAGNOSIS — J181 Lobar pneumonia, unspecified organism: Secondary | ICD-10-CM | POA: Insufficient documentation

## 2016-11-09 DIAGNOSIS — Z87891 Personal history of nicotine dependence: Secondary | ICD-10-CM | POA: Insufficient documentation

## 2016-11-09 DIAGNOSIS — Z79899 Other long term (current) drug therapy: Secondary | ICD-10-CM | POA: Insufficient documentation

## 2016-11-09 DIAGNOSIS — R079 Chest pain, unspecified: Secondary | ICD-10-CM | POA: Diagnosis not present

## 2016-11-09 LAB — CBC
HEMATOCRIT: 40.3 % (ref 39.0–52.0)
Hemoglobin: 13.8 g/dL (ref 13.0–17.0)
MCH: 30.5 pg (ref 26.0–34.0)
MCHC: 34.2 g/dL (ref 30.0–36.0)
MCV: 89 fL (ref 78.0–100.0)
PLATELETS: 225 10*3/uL (ref 150–400)
RBC: 4.53 MIL/uL (ref 4.22–5.81)
RDW: 14.3 % (ref 11.5–15.5)
WBC: 9.1 10*3/uL (ref 4.0–10.5)

## 2016-11-09 LAB — POCT I-STAT TROPONIN I: Troponin i, poc: 0 ng/mL (ref 0.00–0.08)

## 2016-11-09 LAB — BASIC METABOLIC PANEL
Anion gap: 10 (ref 5–15)
BUN: 17 mg/dL (ref 6–20)
CALCIUM: 9.5 mg/dL (ref 8.9–10.3)
CO2: 24 mmol/L (ref 22–32)
CREATININE: 1.38 mg/dL — AB (ref 0.61–1.24)
Chloride: 105 mmol/L (ref 101–111)
GFR calc Af Amer: >60 mL/min (ref >60)
GFR, EST NON AFRICAN AMERICAN: 53 mL/min — AB (ref >60)
GLUCOSE: 108 mg/dL — AB (ref 65–99)
POTASSIUM: 4.2 mmol/L (ref 3.5–5.1)
Sodium: 139 mmol/L (ref 135–145)

## 2016-11-09 MED ORDER — LEVOFLOXACIN 500 MG PO TABS
500.0000 mg | ORAL_TABLET | Freq: Once | ORAL | Status: AC
  Administered 2016-11-09: 500 mg via ORAL
  Filled 2016-11-09: qty 1

## 2016-11-09 MED ORDER — LEVOFLOXACIN 500 MG PO TABS: 500.0000 mg | ORAL_TABLET | Freq: Every day | ORAL | 0 refills | Status: DC

## 2016-11-09 NOTE — Discharge Instructions (Signed)
Please take the antibiotics and see your doctor in 1 week. Return to the ER if the breathing gets worse.

## 2016-11-09 NOTE — ED Triage Notes (Signed)
Pt complaint of constant left chest sharpness with associated fever and headache onset 0300; denies other symptoms.

## 2016-11-09 NOTE — ED Provider Notes (Signed)
[facility]-EMERGENCY DEPT Provider Note   CSN: [id number] Arrival date & time: 11/09/16  0830     History   Chief Complaint Chief Complaint  Patient presents with  . Chest Pain    HPI Gerald Williams is a 66 y.o. male.  HPI Pt with hx of HTN, HL, emphysema comes in with cc of chest pain and weakness. Pt reports that he woke up at 3 am with chest pain. His chest pain is midsternal, constant. Pain is not worse with breathing, coughing or with any change in position. Pt is having cough with thick white phlegm for the past 3 days. He also started having chills and subjective fevers at 3 am. PT is not a smoker any more and denies ant wheezing.   Past Medical History:  Diagnosis Date  . Emphysema   . Erectile dysfunction   . Hyperlipemia   . Hypertension   . Polysubstance abuse    stopped using crack/cocaine December 4th 20-14  . PTSD (post-traumatic stress disorder)   . Sleep apnea     Patient Active Problem List   Diagnosis Date Noted  . Blood in stool 10/05/2015  . Essential hypertension 06/18/2015  . Cephalalgia 06/18/2015  . Dyslipidemia 06/12/2015  . COPD mixed type (HCC) 05/28/2015  . Peripheral neuropathy 05/20/2015  . COPD (chronic obstructive pulmonary disease) with emphysema (HCC) 05/20/2015  . PTSD (post-traumatic stress disorder) 05/20/2015  . Right carotid bruit 05/20/2015  . Frequent urination 05/20/2015  . Hematuria 07/15/2014  . History of tobacco abuse:  quit in ~ 06/2013  07/14/2014  . OSA on CPAP 07/14/2014  . Pre-diabetes 07/14/2014  . Claudication, R>L 07/14/2014  . Cocaine use, history of 03/26/2011  . Chest pain 03/26/2011    Past Surgical History:  Procedure Laterality Date  . LEFT HEART CATHETERIZATION WITH CORONARY ANGIOGRAM N/A 07/15/2014   Procedure: LEFT HEART CATHETERIZATION WITH CORONARY ANGIOGRAM;  Surgeon: Lennette Bihari, MD;  Location: [facility] CATH LAB;  Service: Cardiovascular;  Laterality: N/A;       Home Medications    Prior to  Admission medications   Medication Sig Start Date End Date Taking? Authorizing Provider  albuterol (PROVENTIL HFA;VENTOLIN HFA) 108 (90 BASE) MCG/ACT inhaler Inhale 2 puffs into the lungs every 6 (six) hours as needed for shortness of breath.    [provider]  albuterol (PROVENTIL) (2.5 MG/3ML) 0.083% nebulizer solution Take 2.5 mg by nebulization 3 (three) times daily.     [provider]  albuterol-ipratropium (COMBIVENT) 18-103 MCG/ACT inhaler Inhale 1 puff into the lungs every 6 (six) hours as needed for wheezing. 08/18/15   Michele Mcalpine, MD  amoxicillin-clavulanate (AUGMENTIN) 875-125 MG tablet Take 1 tablet by mouth every 12 (twelve) hours. 09/19/16   Antony Madura, PA-C  aspirin 81 MG tablet Take 81 mg by mouth daily.    [provider]  atorvastatin (LIPITOR) 80 MG tablet Take 80 mg by mouth at bedtime.    [provider]  benzonatate (TESSALON) 100 MG capsule Take 1-2 capsules (100-200 mg total) by mouth 3 (three) times daily as needed for cough. 09/19/16   Antony Madura, PA-C  budesonide-formoterol (SYMBICORT) 160-4.5 MCG/ACT inhaler Inhale 2 puffs into the lungs 2 (two) times daily. Patient not taking: Reported on 09/19/2016 08/18/15   Michele Mcalpine, MD  glucose blood (COOL BLOOD GLUCOSE TEST STRIPS) test strip Used to test blood sugar x2 daily---diagnosis code r73.03--for one touch verio flex 12/08/15   [name], MD  ipratropium (ATROVENT) 0.02 %  nebulizer solution Take 0.5 mg by nebulization 3 (three) times daily.    [provider]  Lancets MISC Used to test blood sugar x2 daily---diagnosis code r73.03--for one touch verio flex 12/08/15   Burns, [name], MD  levofloxacin (LEVAQUIN) 500 MG tablet Take 1 tablet (500 mg total) by mouth daily. 11/09/16   Derwood Kaplan, MD  lidocaine (LIDODERM) 5 % Place 1 patch onto the skin daily. Remove & Discard patch within 12 hours or as directed by MD 09/19/16   Antony Madura, PA-C  lisinopril  (PRINIVIL,ZESTRIL) 20 MG tablet Take 40 mg by mouth daily.     [provider]  MIRTAZAPINE PO Take 1 tablet by mouth at bedtime.     [provider]  NON FORMULARY at bedtime. CPAP w/oxygen - [facility] at [facility]    [provider]  nortriptyline (PAMELOR) 10 MG capsule Take 1 capsule (10 mg total) by mouth at bedtime. Patient not taking: Reported on 09/19/2016 09/23/15   Nita Sickle K, DO  Polyethyl Glycol-Propyl Glycol 0.4-0.3 % SOLN Apply 2 drops to eye as needed. Patient taking differently: Apply 2 drops to eye daily as needed (dry eyes).  08/25/16   Nche, Bonna Gains, NP  predniSONE (DELTASONE) 20 MG tablet Take 2 tablets (40 mg total) by mouth daily. 09/19/16   Antony Madura, PA-C  sildenafil (VIAGRA) 100 MG tablet Take 100 mg by mouth daily as needed for erectile dysfunction.    [provider]  valsartan (DIOVAN) 80 MG tablet Take 1 tablet (80 mg total) by mouth daily. Patient not taking: Reported on 09/19/2016 05/26/15   [name], MD    Family History Family History  Problem Relation Age of Onset  . Diabetes Mother        Deceased  . Heart disease Father        Deceased  . Heart attack Father        x5  . Cancer - Other Sister   . Healthy Son   . Healthy Daughter   . Heart attack Brother        Deceased  . Alcohol abuse Brother        Deceased    Social History Social History  Substance Use Topics  . Smoking status: Former Smoker    Packs/day: 1.50    Years: 50.00    Types: Cigarettes    Start date: 04/26/1963    Quit date: 09/22/2013  . Smokeless tobacco: Never Used     Comment: patient using the patch, losenges, and medication  . Alcohol use No     Comment: He was drinking 3-4 drinks nightly x 40 years, quit in 2014.       Allergies   Patient has no known allergies.   Review of Systems Review of Systems  Constitutional: Negative for activity change and appetite change.  Respiratory: Positive for cough and shortness of  breath.   Cardiovascular: Positive for chest pain.  Gastrointestinal: Negative for abdominal pain.  Genitourinary: Negative for dysuria.     Physical Exam Updated Vital Signs BP 131/90 (BP Location: Left Arm)   Pulse 75   Temp 98.3 F (36.8 C) (Oral)   Resp (!) 25   Ht 5\' 5"  (1.651 m)   Wt 77.6 kg (171 lb)   SpO2 96%   BMI 28.46 kg/m   Physical Exam  Constitutional: He is oriented to person, place, and time. He appears well-developed.  HENT:  Head: Atraumatic.  Neck: Neck supple.  Cardiovascular: Normal rate.   Pulmonary/Chest: Effort normal. No respiratory distress. He has no wheezes.  Neurological: He is alert and oriented to person, place, and time.  Skin: Skin is warm.  Nursing note and vitals reviewed.    ED Treatments / Results  Labs (all labs ordered are listed, but only abnormal results are displayed) Labs Reviewed  BASIC METABOLIC PANEL - Abnormal; Notable for the following:       Result Value   Glucose, Bld 108 (*)    Creatinine, Ser 1.38 (*)    GFR calc non Af Amer 53 (*)    All other components within normal limits  CBC  I-STAT TROPOININ, ED  POCT I-STAT TROPONIN I    EKG  EKG Interpretation  Date/Time:  Wednesday November 09 2016 08:39:34 EDT Ventricular Rate:  79 PR Interval:    QRS Duration: 82 QT Interval:  371 QTC Calculation: 428 R Axis:   19 Text Interpretation:  Sinus rhythm Nonspecific T abnrm, anterolateral leads No acute changes No significant change since last tracing Confirmed by Derwood Kaplan [phone]) on 11/09/2016 8:44:28 AM Also confirmed by Derwood Kaplan [phone]), editor Misty Stanley [phone])  on 11/09/2016 8:51:13 AM       Radiology Dg Chest 2 View  Result Date: 11/09/2016 CLINICAL DATA:  Chest pain EXAM: CHEST  2 VIEW COMPARISON:  09/18/2016 FINDINGS: Chronic linear opacities, most notable in the subpleural lateral right base. There is new airspace opacity in the right middle lobe. No edema, effusion, or  pneumothorax. Borderline hyperinflation. Normal heart size and aortic contours. IMPRESSION: 1. Right middle lobe pneumonia. Followup PA and lateral chest X-ray is recommended in 3-4 weeks following trial of antibiotic therapy to ensure resolution. 2. Mild scarring elsewhere. Electronically Signed   By: Marnee Spring M.D.   On: 11/09/2016 09:12    Procedures Procedures (including critical care time)  Medications Ordered in ED Medications  levofloxacin (LEVAQUIN) tablet 500 mg (not administered)     Initial Impression / Assessment and Plan / ED Course  I have reviewed the triage vital signs and the nursing notes.  Pertinent labs & imaging results that were available during my care of the patient were reviewed by me and considered in my medical decision making (see chart for details).    Pt comes in with cc of cough, chest pain, subjective fevers and chills with malaise. Chest pain is constant, not exertional and not radiating. Pain is not typical for ACS, trop and EKG are reassuring, and I dont think a rule out is needed. Lung exam didn't reveal and specific rhonchi or wheezing, however CXR does show a clear infiltrate and pt has clinical features of CAP. Will treat with levaquin given the co morbidities.  Strict ER return precautions have been discussed, and patient is agreeing with the plan and is comfortable with the workup done and the recommendations from the ER.   Final Clinical Impressions(s) / ED Diagnoses   Final diagnoses:  Community acquired pneumonia of right middle lobe of lung (HCC)  Pulmonary emphysema, unspecified emphysema type (HCC)    New Prescriptions New Prescriptions   LEVOFLOXACIN (LEVAQUIN) 500 MG TABLET    Take 1 tablet (500 mg total) by mouth daily.     Derwood Kaplan, MD 11/09/16 1013

## 2016-11-09 NOTE — ED Notes (Signed)
Bed: [facility] Expected date:  Expected time:  Means of arrival:  Comments: 

## 2016-12-22 ENCOUNTER — Emergency Department (HOSPITAL_COMMUNITY)
Admission: EM | Admit: 2016-12-22 | Discharge: 2016-12-22 | Disposition: A | Discharge status: Home / Self Care | Payer: Medicare Other | Attending: Emergency Medicine | Admitting: Emergency Medicine

## 2016-12-22 ENCOUNTER — Encounter (HOSPITAL_COMMUNITY): Payer: Self-pay

## 2016-12-22 ENCOUNTER — Emergency Department (HOSPITAL_COMMUNITY): Payer: Medicare Other

## 2016-12-22 DIAGNOSIS — J441 Chronic obstructive pulmonary disease with (acute) exacerbation: Secondary | ICD-10-CM

## 2016-12-22 DIAGNOSIS — Z7982 Long term (current) use of aspirin: Secondary | ICD-10-CM | POA: Insufficient documentation

## 2016-12-22 DIAGNOSIS — M25561 Pain in right knee: Secondary | ICD-10-CM | POA: Insufficient documentation

## 2016-12-22 DIAGNOSIS — Z87891 Personal history of nicotine dependence: Secondary | ICD-10-CM | POA: Insufficient documentation

## 2016-12-22 DIAGNOSIS — R0602 Shortness of breath: Secondary | ICD-10-CM | POA: Diagnosis not present

## 2016-12-22 DIAGNOSIS — Z79899 Other long term (current) drug therapy: Secondary | ICD-10-CM | POA: Diagnosis not present

## 2016-12-22 DIAGNOSIS — R05 Cough: Secondary | ICD-10-CM | POA: Diagnosis not present

## 2016-12-22 DIAGNOSIS — I10 Essential (primary) hypertension: Secondary | ICD-10-CM | POA: Diagnosis not present

## 2016-12-22 LAB — BASIC METABOLIC PANEL
Anion gap: 6 (ref 5–15)
BUN: 15 mg/dL (ref 6–20)
CHLORIDE: 110 mmol/L (ref 101–111)
CO2: 24 mmol/L (ref 22–32)
CREATININE: 1.2 mg/dL (ref 0.61–1.24)
Calcium: 9 mg/dL (ref 8.9–10.3)
GFR calc Af Amer: >60 mL/min (ref >60)
GFR calc non Af Amer: >60 mL/min (ref >60)
Glucose, Bld: 104 mg/dL — ABNORMAL HIGH (ref 65–99)
Potassium: 3.6 mmol/L (ref 3.5–5.1)
Sodium: 140 mmol/L (ref 135–145)

## 2016-12-22 LAB — CBC
HEMATOCRIT: 39.4 % (ref 39.0–52.0)
Hemoglobin: 13.8 g/dL (ref 13.0–17.0)
MCH: 30.7 pg (ref 26.0–34.0)
MCHC: 35 g/dL (ref 30.0–36.0)
MCV: 87.6 fL (ref 78.0–100.0)
PLATELETS: 198 10*3/uL (ref 150–400)
RBC: 4.5 MIL/uL (ref 4.22–5.81)
RDW: 14.2 % (ref 11.5–15.5)
WBC: 4.7 10*3/uL (ref 4.0–10.5)

## 2016-12-22 LAB — I-STAT TROPONIN, ED: Troponin i, poc: 0 ng/mL (ref 0.00–0.08)

## 2016-12-22 MED ORDER — IPRATROPIUM-ALBUTEROL 0.5-2.5 (3) MG/3ML IN SOLN
3.0000 mL | Freq: Once | RESPIRATORY_TRACT | Status: DC
  Filled 2016-12-22: qty 3

## 2016-12-22 MED ORDER — ALBUTEROL (5 MG/ML) CONTINUOUS INHALATION SOLN
7.5000 mg/h | INHALATION_SOLUTION | RESPIRATORY_TRACT | Status: AC
  Administered 2016-12-22: 7.5 mg/h via RESPIRATORY_TRACT
  Filled 2016-12-22 (×2): qty 20

## 2016-12-22 MED ORDER — ALBUTEROL SULFATE (2.5 MG/3ML) 0.083% IN NEBU
5.0000 mg | INHALATION_SOLUTION | Freq: Once | RESPIRATORY_TRACT | Status: AC
  Administered 2016-12-22: 5 mg via RESPIRATORY_TRACT
  Filled 2016-12-22: qty 6

## 2016-12-22 MED ORDER — PREDNISONE 10 MG PO TABS: 40.0000 mg | ORAL_TABLET | Freq: Every day | ORAL | 0 refills | Status: AC

## 2016-12-22 MED ORDER — NAPROXEN 500 MG PO TABS: 500.0000 mg | ORAL_TABLET | Freq: Two times a day (BID) | ORAL | 0 refills | Status: DC

## 2016-12-22 MED ORDER — METHYLPREDNISOLONE SODIUM SUCC 125 MG IJ SOLR
125.0000 mg | Freq: Once | INTRAMUSCULAR | Status: AC
  Administered 2016-12-22: 125 mg via INTRAVENOUS
  Filled 2016-12-22: qty 2

## 2016-12-22 MED ORDER — ALBUTEROL SULFATE HFA 108 (90 BASE) MCG/ACT IN AERS
2.0000 | INHALATION_SPRAY | Freq: Once | RESPIRATORY_TRACT | Status: AC
  Administered 2016-12-22: 2 via RESPIRATORY_TRACT
  Filled 2016-12-22: qty 6.7

## 2016-12-22 NOTE — ED Triage Notes (Addendum)
Shortness of breath x 2 days productive cough with white secretion with hx of copd voiced able to speak in full sentences. Right knee pain with swelling x 1 day no trauma voiced.

## 2016-12-22 NOTE — ED Provider Notes (Signed)
[facility]ER[facility] DEPT Provider Note   CSN: [id number] Arrival date & time: 12/22/16  0630     History   Chief Complaint Chief Complaint  Patient presents with  . Shortness of Breath  . Knee Pain    HPI Gerald Williams is a 66 y.o. male.  HPI  66 year old male with a history of COPD, hypertension, hyperlipidemia presents with concern for 2 days of shortness of breath, cough, wheezing and 3 days of right knee swelling and pain. Regarding knee, pain started spontaneously. Denies any falls or trauma. Reports that the pain is a 6 out of 10, worse when getting in and out of the car. Denies fevers, has a history of IV drug use, history of prior septic joints. Denies history of gout.  Regarding shortness of breath, reports this is been present for about 2 days. Reports that it feels like his COPD and he needs steroids. Reports associated wheezing, cough productive of white sputum. Denies fevers. Reports some chest pain that feels like a tightness that he's had in association with his COPD in the past. Does not have other leg pain or swelling   Past Medical History:  Diagnosis Date  . Emphysema   . Erectile dysfunction   . Hyperlipemia   . Hypertension   . Polysubstance abuse    stopped using crack/cocaine December 4th 20-14  . PTSD (post-traumatic stress disorder)   . Sleep apnea     Patient Active Problem List   Diagnosis Date Noted  . Blood in stool 10/05/2015  . Essential hypertension 06/18/2015  . Cephalalgia 06/18/2015  . Dyslipidemia 06/12/2015  . COPD mixed type (HCC) 05/28/2015  . Peripheral neuropathy 05/20/2015  . COPD (chronic obstructive pulmonary disease) with emphysema (HCC) 05/20/2015  . PTSD (post-traumatic stress disorder) 05/20/2015  . Right carotid bruit 05/20/2015  . Frequent urination 05/20/2015  . Hematuria 07/15/2014  . History of tobacco abuse:  quit in ~ 06/2013  07/14/2014  . OSA on CPAP 07/14/2014  . Pre-diabetes 07/14/2014  . Claudication, R>L  07/14/2014  . Cocaine use, history of 03/26/2011  . Chest pain 03/26/2011    Past Surgical History:  Procedure Laterality Date  . LEFT HEART CATHETERIZATION WITH CORONARY ANGIOGRAM N/A 07/15/2014   Procedure: LEFT HEART CATHETERIZATION WITH CORONARY ANGIOGRAM;  Surgeon: Lennette Bihari, MD;  Location: [facility] CATH LAB;  Service: Cardiovascular;  Laterality: N/A;       Home Medications    Prior to Admission medications   Medication Sig Start Date End Date Taking? Authorizing Provider  albuterol (PROVENTIL HFA;VENTOLIN HFA) 108 (90 BASE) MCG/ACT inhaler Inhale 2 puffs into the lungs every 6 (six) hours as needed for shortness of breath.   Yes [provider]  albuterol (PROVENTIL) (2.5 MG/3ML) 0.083% nebulizer solution Take 2.5 mg by nebulization 3 (three) times daily.    Yes [provider]  albuterol-ipratropium (COMBIVENT) 18-103 MCG/ACT inhaler Inhale 1 puff into the lungs every 6 (six) hours as needed for wheezing. 08/18/15  Yes Michele Mcalpine, MD  aspirin 81 MG tablet Take 81 mg by mouth daily.   Yes [provider]  glucose blood (COOL BLOOD GLUCOSE TEST STRIPS) test strip Used to test blood sugar x2 daily---diagnosis code r73.03--for one touch verio flex 12/08/15  Yes Burns, [name], MD  ipratropium (ATROVENT) 0.02 % nebulizer solution Take 0.5 mg by nebulization 3 (three) times daily.   Yes [provider]  Lancets MISC Used to test blood sugar x2 daily---diagnosis code r73.03--for one touch verio flex  12/08/15  Yes Burns, [name], MD  lisinopril (PRINIVIL,ZESTRIL) 20 MG tablet Take 40 mg by mouth daily.    Yes [provider]  MIRTAZAPINE PO Take 2 tablets by mouth 2 (two) times daily.    Yes [provider]  NON FORMULARY at bedtime. CPAP - followed at [facility]   Yes [provider]  sildenafil (VIAGRA) 100 MG tablet Take 100 mg by mouth daily as needed for erectile dysfunction.   Yes [provider]    amoxicillin-clavulanate (AUGMENTIN) 875-125 MG tablet Take 1 tablet by mouth every 12 (twelve) hours. Patient not taking: Reported on 12/22/2016 09/19/16   Antony Madura, PA-C  benzonatate (TESSALON) 100 MG capsule Take 1-2 capsules (100-200 mg total) by mouth 3 (three) times daily as needed for cough. Patient not taking: Reported on 12/22/2016 09/19/16   Antony Madura, PA-C  budesonide-formoterol [facility]) 160-4.5 MCG/ACT inhaler Inhale 2 puffs into the lungs 2 (two) times daily. Patient not taking: Reported on 09/19/2016 08/18/15   Michele Mcalpine, MD  levofloxacin (LEVAQUIN) 500 MG tablet Take 1 tablet (500 mg total) by mouth daily. Patient not taking: Reported on 12/22/2016 11/09/16   Derwood Kaplan, MD  lidocaine (LIDODERM) 5 % Place 1 patch onto the skin daily. Remove & Discard patch within 12 hours or as directed by MD Patient not taking: Reported on 12/22/2016 09/19/16   Antony Madura, PA-C  naproxen (NAPROSYN) 500 MG tablet Take 1 tablet (500 mg total) by mouth 2 (two) times daily. 12/22/16   Alvira Monday, MD  nortriptyline (PAMELOR) 10 MG capsule Take 1 capsule (10 mg total) by mouth at bedtime. Patient not taking: Reported on 09/19/2016 09/23/15   Nita Sickle K, DO  Polyethyl Glycol-Propyl Glycol 0.4-0.3 % SOLN Apply 2 drops to eye as needed. Patient not taking: Reported on 12/22/2016 08/25/16   Nche, Bonna Gains, NP  predniSONE (DELTASONE) 10 MG tablet Take 4 tablets (40 mg total) by mouth daily. 12/22/16 12/26/16  Alvira Monday, MD  valsartan (DIOVAN) 80 MG tablet Take 1 tablet (80 mg total) by mouth daily. Patient not taking: Reported on 09/19/2016 05/26/15   [name], MD    Family History Family History  Problem Relation Age of Onset  . Diabetes Mother        Deceased  . Heart disease Father        Deceased  . Heart attack Father        x5  . Cancer - Other Sister   . Healthy Son   . Healthy Daughter   . Heart attack Brother        Deceased  . Alcohol abuse Brother         Deceased    Social History Social History  Substance Use Topics  . Smoking status: Former Smoker    Packs/day: 1.50    Years: 50.00    Types: Cigarettes    Start date: 04/26/1963    Quit date: 09/22/2013  . Smokeless tobacco: Never Used     Comment: patient using the patch, losenges, and medication  . Alcohol use No     Comment: He was drinking 3-4 drinks nightly x 40 years, quit in 2014.       Allergies   Patient has no known allergies.   Review of Systems Review of Systems  Constitutional: Negative for fever.  HENT: Negative for sore throat.   Eyes: Negative for visual disturbance.  Respiratory: Positive for cough, shortness of breath and wheezing.   Cardiovascular:  Positive for chest pain.  Gastrointestinal: Negative for abdominal pain, diarrhea, nausea and vomiting.  Genitourinary: Negative for difficulty urinating.  Musculoskeletal: Positive for arthralgias. Negative for back pain and neck stiffness.  Skin: Negative for rash.  Neurological: Negative for syncope and headaches.     Physical Exam Updated Vital Signs BP (!) 151/83 (BP Location: Right Arm)   Pulse 75   Temp 98.3 F (36.8 C) (Oral)   Resp (!) 27   SpO2 93%   Physical Exam  Constitutional: He is oriented to person, place, and time. He appears well-developed and well-nourished. No distress.  HENT:  Head: Normocephalic and atraumatic.  Eyes: Conjunctivae and EOM are normal.  Neck: Normal range of motion.  Cardiovascular: Normal rate, regular rhythm, normal heart sounds and intact distal pulses.  Exam reveals no gallop and no friction rub.   No murmur heard. Pulmonary/Chest: Effort normal. No respiratory distress. He has wheezes. He has no rales.  Abdominal: Soft. He exhibits no distension. There is no tenderness. There is no guarding.  Musculoskeletal: He exhibits no edema.       Right knee: He exhibits swelling. He exhibits no erythema. Decreased range of motion: reports some pain with ROM but  has good ROM, passive ROM without significant pain.       Left knee: Normal. He exhibits no swelling.  Neurological: He is alert and oriented to person, place, and time.  Skin: Skin is warm and dry. He is not diaphoretic.  Nursing note and vitals reviewed.    ED Treatments / Results  Labs (all labs ordered are listed, but only abnormal results are displayed) Labs Reviewed  BASIC METABOLIC PANEL - Abnormal; Notable for the following:       Result Value   Glucose, Bld 104 (*)    All other components within normal limits  CBC  I-STAT TROPONIN, ED    EKG  EKG Interpretation  Date/Time:  Thursday December 22 2016 07:04:50 EDT Ventricular Rate:  65 PR Interval:  118 QRS Duration: 76 QT Interval:  420 QTC Calculation: 436 R Axis:   61 Text Interpretation:  Normal sinus rhythm Normal ECG No significant change since last tracing Confirmed by Alvira Monday ([id number]) on 12/22/2016 7:26:58 AM       Radiology Dg Chest 2 View  Result Date: 12/22/2016 CLINICAL DATA:  Shortness of breath, cough EXAM: CHEST  2 VIEW COMPARISON:  11/09/2016 FINDINGS: Interval resolution of the right basilar airspace opacity. There is hyperinflation of the lungs compatible with COPD. No confluent opacities currently. No effusions. Heart is normal size. IMPRESSION: Interval clearance of the right middle lobe pneumonia. No active disease. COPD. Electronically Signed   By: Charlett Nose M.D.   On: 12/22/2016 07:59   Dg Knee Complete 4 Views Right  Result Date: 12/22/2016 CLINICAL DATA:  Right knee pain.  No known injury. EXAM: RIGHT KNEE - COMPLETE 4+ VIEW COMPARISON:  No recent prior . FINDINGS: No acute bony abnormality identified. No evidence of fracture or dislocation. Small knee joint effusion. IMPRESSION: 1. Small knee joint effusion. 2. No acute bony abnormality. Electronically Signed   By: Maisie Fus  Register   On: 12/22/2016 08:00    Procedures Procedures (including critical care time)  Medications  Ordered in ED Medications  albuterol (PROVENTIL,VENTOLIN) solution continuous neb (0 mg/hr Nebulization Stopped 12/22/16 0950)  albuterol (PROVENTIL) (2.5 MG/3ML) 0.083% nebulizer solution 5 mg (5 mg Nebulization Given 12/22/16 0654)  methylPREDNISolone sodium succinate (SOLU-MEDROL) 125 mg/2 mL injection 125 mg (125 mg Intravenous  Given 12/22/16 0827)  albuterol (PROVENTIL HFA;VENTOLIN HFA) 108 (90 Base) MCG/ACT inhaler 2 puff (2 puffs Inhalation Given 12/22/16 1050)     Initial Impression / Assessment and Plan / ED Course  I have reviewed the triage vital signs and the nursing notes.  Pertinent labs & imaging results that were available during my care of the patient were reviewed by me and considered in my medical decision making (see chart for details).     66 year old male with a history of COPD, hypertension, hyperlipidemia presents with concern for 2 days of shortness of breath, cough, wheezing and 3 days of right knee swelling and pain.  Regarding knee pain. XR without acute abnormalities. Good ROM, no leukocytosis, no erythema, no fever, doubt septic arthritis. Discussed possibility of arthrocentesis but given low suspicion feel outpatient follow up and strict return precautions is appropriate.  Given rx for naproxen.  Regarding dyspnea. EKG without acute abnormalities. Troponin negative.  History and exam most consistent with COPD exacerbation.  Given solumedrol, albuterol, atrovent with improvement. Given rx for prednisone.  Patient discharged in stable condition with understanding of reasons to return.   Final Clinical Impressions(s) / ED Diagnoses   Final diagnoses:  Acute pain of right knee  COPD exacerbation (HCC)    New Prescriptions Discharge Medication List as of 12/22/2016 10:43 AM    START taking these medications   Details  naproxen (NAPROSYN) 500 MG tablet Take 1 tablet (500 mg total) by mouth 2 (two) times daily., Starting Thu 12/22/2016, Print         Alvira Monday, MD 12/22/16 2147

## 2016-12-23 ENCOUNTER — Encounter: Payer: Self-pay | Admitting: Family Medicine

## 2016-12-23 ENCOUNTER — Ambulatory Visit (INDEPENDENT_AMBULATORY_CARE_PROVIDER_SITE_OTHER): Payer: Medicare Other | Admitting: Family Medicine

## 2016-12-23 VITALS — BP 128/80 | HR 74 | Temp 98.0°F | Ht 65.0 in | Wt 163.0 lb

## 2016-12-23 DIAGNOSIS — M23200 Derangement of unspecified lateral meniscus due to old tear or injury, right knee: Secondary | ICD-10-CM

## 2016-12-23 DIAGNOSIS — M25561 Pain in right knee: Secondary | ICD-10-CM | POA: Diagnosis not present

## 2016-12-23 MED ORDER — NAPROXEN 500 MG PO TABS: 500.0000 mg | ORAL_TABLET | Freq: Two times a day (BID) | ORAL | 1 refills | Status: DC | PRN

## 2016-12-23 NOTE — Assessment & Plan Note (Signed)
Pain seems to be associated with degenerative meniscal tear. His x-rays were not significant for severe arthritis. He does have an effusion on exam. In denies any injury. - Continue naproxen and provided a refill today - Encourage ice and compression - If no improvement then can consider aspiration and steroid injection at follow-up - Provided home exercises. Could consider physical therapy.

## 2016-12-23 NOTE — Patient Instructions (Addendum)
Thank you for coming in,   Please try to do the exercises. Please try a compression sleeve to help keep the swelling out of the knee. Try this for a couple of weeks and if you do not have any improvement please follow-up with me and we can consider an injection.   Please feel free to call with any questions or concerns at any time, at [phone]. --Dr. Jordan Likes

## 2016-12-23 NOTE — Assessment & Plan Note (Signed)
This appears to be the source of his effusion and no mechanical symptoms today.

## 2016-12-23 NOTE — Progress Notes (Signed)
Gerald Williams - 67 y.o. male MRN [id number]  Date of birth: [birth year]  SUBJECTIVE:  Including CC & ROS.  Chief Complaint  Patient presents with  . Knee Pain    Swollen right knee pain started 2 days ago. patient states he woke up with a swollen knee and the pain.    Gerald Williams is a 67 year old male that is presenting with right knee pain. This pain is acute on chronic in nature. He denies any injury to it. It has started swelling just recently. He reports the pain is in the back of his knee and wraps around to the front. The pain is dull in [place]. He reports that there is no radiation. It is worse with walking. He is unable to flex his knee completely. Years had improvement with the naproxen as well as a steroid injection he received yesterday.   He was seen in the emergency department yesterday for right knee pain. He was given a Solu-Medrol injection. He has been started on naproxen. I have any bili reviewed his x-rays from 12/22/16 showing minimal arthritic change. Review his blood work from 12/22/16 shows normal kidney function.  Review of Systems  Constitutional: Negative for fever.  Musculoskeletal: Positive for joint swelling. Negative for back pain and gait problem.  Skin: Negative for color change.  Neurological: Negative for weakness and numbness.  Hematological: Negative for adenopathy.  otherwise negative   HISTORY: Past Medical, Surgical, Social, and Family History Reviewed & Updated per EMR.   Pertinent Historical Findings include:  Past Medical History:  Diagnosis Date  . Emphysema   . Erectile dysfunction   . Hyperlipemia   . Hypertension   . Polysubstance abuse    stopped using crack/cocaine December 4th 20-14  . PTSD (post-traumatic stress disorder)   . Sleep apnea     Past Surgical History:  Procedure Laterality Date  . LEFT HEART CATHETERIZATION WITH CORONARY ANGIOGRAM N/A 07/15/2014   Procedure: LEFT HEART CATHETERIZATION WITH CORONARY ANGIOGRAM;  Surgeon:  Lennette Bihari, MD;  Location: [facility] CATH LAB;  Service: Cardiovascular;  Laterality: N/A;    No Known Allergies  Family History  Problem Relation Age of Onset  . Diabetes Mother        Deceased  . Heart disease Father        Deceased  . Heart attack Father        x5  . Cancer - Other Sister   . Healthy Son   . Healthy Daughter   . Heart attack Brother        Deceased  . Alcohol abuse Brother        Deceased     Social History   Social History  . Marital status: Married    Spouse name: N/A  . Number of children: 4  . Years of education: N/A   Occupational History  . chef - retired    Social History Main Topics  . Smoking status: Former Smoker    Packs/day: 1.50    Years: 50.00    Types: Cigarettes    Start date: 04/26/1963    Quit date: 09/22/2013  . Smokeless tobacco: Never Used     Comment: patient using the patch, losenges, and medication  . Alcohol use No     Comment: He was drinking 3-4 drinks nightly x 40 years, quit in 2014.    . Drug use: No     Comment: history of crack use and drug tested weekly at the [place]  .  Sexual activity: Not on file   Other Topics Concern  . Not on file   Social History Narrative   No regular exercise   epworth sleepiness scale = 13 (06/12/15)   Lives alone in a one story home.  Has 4 children and 9 grandchildren.  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  Wants to keep working but it is hard for him to stand.  Education: 14 years and 20 years of [employer].       PHYSICAL EXAM:  VS: BP 128/80 (BP Location: Left Arm, Patient Position: Sitting, Cuff Size: Normal)   Pulse 74   Temp 98 F (36.7 C) (Oral)   Ht 5\' 5"  (1.651 m)   Wt 163 lb (73.9 kg)   SpO2 98%   BMI 27.12 kg/m  Physical Exam Gen: NAD, alert, cooperative with exam, well-appearing ENT: normal lips, normal nasal mucosa,  Eye: normal EOM, normal conjunctiva and lids CV:  no edema, +2 pedal pulses   Resp: no accessory muscle use, non-labored,   Skin: no rashes, no areas  of induration  Neuro: normal tone, normal sensation to touch Psych:  normal insight, alert and oriented MSK:  Right knee:  Mild effusion  No TTP of the lateral or medial joint line.  Full extension and limited flexion to 110 degrees No endpoints with valgus and varus instability  Negative McMurray's test  Neurovascularly intact     Limited ultrasound: right knee:  Mild to moderate effusion in the suprapatellar pouch. Normal quadrant and patellar tendon Outpouching in the medial and lateral meniscus. Some splits within the lateral and medial meniscus to suggest degenerative tears  Summary: Findings consistent with an effusion and degenerative meniscal changes  Ultrasound and interpretation by Clare Gandy, MD            ASSESSMENT & PLAN:   Acute pain of right knee Pain seems to be associated with degenerative meniscal tear. His x-rays were not significant for severe arthritis. He does have an effusion on exam. In denies any injury. - Continue naproxen and provided a refill today - Encourage ice and compression - If no improvement then can consider aspiration and steroid injection at follow-up - Provided home exercises. Could consider physical therapy.  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

## 2017-03-18 DIAGNOSIS — R5383 Other fatigue: Secondary | ICD-10-CM | POA: Diagnosis not present

## 2017-03-18 DIAGNOSIS — R079 Chest pain, unspecified: Secondary | ICD-10-CM | POA: Diagnosis not present

## 2017-05-11 ENCOUNTER — Emergency Department (HOSPITAL_COMMUNITY)
Admission: EM | Admit: 2017-05-11 | Discharge: 2017-05-11 | Discharge status: Against Medical Advice | Payer: Medicare Other

## 2017-05-11 NOTE — ED Notes (Signed)
Pt was called on 2 separate times and did not answer.

## 2017-05-11 NOTE — ED Notes (Signed)
Patient called for triage x 1 with no response.  

## 2017-05-17 ENCOUNTER — Ambulatory Visit: Payer: Self-pay

## 2017-05-17 NOTE — Telephone Encounter (Signed)
Patient called in with c/o "left arm pain." He says "I woke up this morning and the pain was there, it's shooting pain from my shoulder all the way down to my hand." I asked is the pain constant and how bad is the pain on the pain scale, he said "no, it's been off and on all day and it's a 6. Right now it's not hurting." I asked the patient if he had any numbness to his arm, neck pain, headache, he said "no numbness or weakness; I didn't have any neck pain and the headache I've had on the right side of my head x 1 week and I took ibuprofen for that. I have migraine history in my family, so I thought that the headache was a migraine." I asked did he take anything for the arm pain, he said "no, I haven't taken anything today. I had a shingles shot in the same arm about 2 weeks ago and I had chest pain, so I took baby aspirin for several days and that took care of that pain, but I didn't have pain in my arm." He denies injury to the arm, fever, redness to the arm. According to protocol, go to the ED, care advice given, I asked the patient if he had a ride, he said "I can find someone to take me."    Reason for Disposition . [1] Age > 40 AND [2] no obvious cause AND [3] pain even when not moving the arm    (Exception: pain is clearly made worse by moving arm or bending neck)  Answer Assessment - Initial Assessment Questions 1. ONSET: "When did the pain start?"     Woke up at 6am with pain  2. LOCATION: "Where is the pain located?"     Left arm from shoulder down 3. PAIN: "How bad is the pain?" (Scale 1-10; or mild, moderate, severe)   - MILD (1-3): doesn't interfere with normal activities   - MODERATE (4-7): interferes with normal activities (e.g., work or school) or awakens from sleep   - SEVERE (8-10): excruciating pain, unable to do any normal activities, unable to hold a cup of water     6 4. WORK OR EXERCISE: "Has there been any recent work or exercise that involved this part of the body?"      No 5. CAUSE: "What do you think is causing the arm pain?"     Unknown 6. OTHER SYMPTOMS: "Do you have any other symptoms?" (e.g., neck pain, swelling, rash, fever, numbness, weakness)     Headache to right side head x 1 week 7. PREGNANCY: "Is there any chance you are pregnant?" "When was your last menstrual period?"     N/A  Protocols used: ARM PAIN-A-AH

## 2017-05-18 ENCOUNTER — Emergency Department (HOSPITAL_COMMUNITY)
Admission: EM | Admit: 2017-05-18 | Discharge: 2017-05-18 | Disposition: A | Discharge status: Home / Self Care | Payer: Medicare Other | Attending: Emergency Medicine | Admitting: Emergency Medicine

## 2017-05-18 ENCOUNTER — Encounter (HOSPITAL_COMMUNITY): Payer: Self-pay | Admitting: Emergency Medicine

## 2017-05-18 ENCOUNTER — Emergency Department (HOSPITAL_COMMUNITY): Payer: Medicare Other

## 2017-05-18 DIAGNOSIS — M79602 Pain in left arm: Secondary | ICD-10-CM | POA: Insufficient documentation

## 2017-05-18 DIAGNOSIS — J449 Chronic obstructive pulmonary disease, unspecified: Secondary | ICD-10-CM | POA: Insufficient documentation

## 2017-05-18 DIAGNOSIS — R079 Chest pain, unspecified: Secondary | ICD-10-CM | POA: Diagnosis not present

## 2017-05-18 DIAGNOSIS — Z79899 Other long term (current) drug therapy: Secondary | ICD-10-CM | POA: Diagnosis not present

## 2017-05-18 DIAGNOSIS — R0789 Other chest pain: Secondary | ICD-10-CM | POA: Diagnosis not present

## 2017-05-18 DIAGNOSIS — R110 Nausea: Secondary | ICD-10-CM | POA: Insufficient documentation

## 2017-05-18 DIAGNOSIS — I10 Essential (primary) hypertension: Secondary | ICD-10-CM | POA: Diagnosis not present

## 2017-05-18 DIAGNOSIS — R5383 Other fatigue: Secondary | ICD-10-CM | POA: Diagnosis not present

## 2017-05-18 DIAGNOSIS — F4310 Post-traumatic stress disorder, unspecified: Secondary | ICD-10-CM | POA: Diagnosis not present

## 2017-05-18 DIAGNOSIS — R51 Headache: Secondary | ICD-10-CM | POA: Insufficient documentation

## 2017-05-18 DIAGNOSIS — Z87891 Personal history of nicotine dependence: Secondary | ICD-10-CM | POA: Insufficient documentation

## 2017-05-18 LAB — CBC
HEMATOCRIT: 40.9 % (ref 39.0–52.0)
Hemoglobin: 14.1 g/dL (ref 13.0–17.0)
MCH: 31 pg (ref 26.0–34.0)
MCHC: 34.5 g/dL (ref 30.0–36.0)
MCV: 89.9 fL (ref 78.0–100.0)
Platelets: 218 10*3/uL (ref 150–400)
RBC: 4.55 MIL/uL (ref 4.22–5.81)
RDW: 13.6 % (ref 11.5–15.5)
WBC: 4.9 10*3/uL (ref 4.0–10.5)

## 2017-05-18 LAB — I-STAT TROPONIN, ED
TROPONIN I, POC: 0 ng/mL (ref 0.00–0.08)
Troponin i, poc: 0 ng/mL (ref 0.00–0.08)

## 2017-05-18 LAB — BASIC METABOLIC PANEL
Anion gap: 6 (ref 5–15)
BUN: 13 mg/dL (ref 6–20)
CO2: 31 mmol/L (ref 22–32)
Calcium: 9.7 mg/dL (ref 8.9–10.3)
Chloride: 107 mmol/L (ref 101–111)
Creatinine, Ser: 1.44 mg/dL — ABNORMAL HIGH (ref 0.61–1.24)
GFR calc Af Amer: 57 mL/min — ABNORMAL LOW (ref >60)
GFR calc non Af Amer: 50 mL/min — ABNORMAL LOW (ref >60)
Glucose, Bld: 95 mg/dL (ref 65–99)
POTASSIUM: 4.4 mmol/L (ref 3.5–5.1)
SODIUM: 144 mmol/L (ref 135–145)

## 2017-05-18 MED ORDER — ASPIRIN 81 MG PO CHEW
324.0000 mg | CHEWABLE_TABLET | Freq: Once | ORAL | Status: AC
  Administered 2017-05-18: 324 mg via ORAL
  Filled 2017-05-18: qty 4

## 2017-05-18 MED ORDER — NITROGLYCERIN 0.4 MG SL SUBL
0.4000 mg | SUBLINGUAL_TABLET | SUBLINGUAL | Status: DC | PRN
  Administered 2017-05-18: 0.4 mg via SUBLINGUAL
  Filled 2017-05-18: qty 1

## 2017-05-18 NOTE — ED Notes (Signed)
Bed: WA22 Expected date:  Expected time:  Means of arrival:  Comments: Hold triage 1  

## 2017-05-18 NOTE — Telephone Encounter (Signed)
Called pt, he was currently at the ED. Advised pt that once he left the ED that he needed to call back and schedule appt with Dr Lawerance Bach. Pt understood.

## 2017-05-18 NOTE — ED Triage Notes (Signed)
Per pt, states left sided chest pain radiating down left arm-states it started yesterday-PCP told him to come to ED-states he did not take his meds this am

## 2017-05-18 NOTE — ED Notes (Signed)
Pt reports he is doing fine, just a mild h/a.

## 2017-05-18 NOTE — ED Notes (Addendum)
Pt reports L lower cp that radiates to his L arm which started yesterday.  He denies any SOB/n/v or dizziness.  Pt is A&Ox 4.  He reports he has nitro SL at home but has not used it in a while.  His wife is at bedside.

## 2017-05-18 NOTE — ED Notes (Signed)
Pt reports h/a after first nitro SL, rates it at 7/10.  He also reports nausea.

## 2017-05-18 NOTE — ED Provider Notes (Signed)
[facility]-EMERGENCY DEPT Provider Note   CSN: [id number] Arrival date & time: 05/18/17  1129     History   Chief Complaint Chief Complaint  Patient presents with  . Chest Pain    HPI Gerald Williams is a 67 y.o. male.  HPI   67yo male with history of hypertension, hyperlipidemia, PTSD, presents with concern for chest pain.  Reports sharp chest pain that began approximately 3 days ago, and has been coming and going spontaneously.  Reports that sharp, left-sided, with radiation down the left arm.  Denies other radiation to the back or neck.  Reports he has COPD, but no increased shortness of breath.  Denies associated nausea, diaphoresis or lightheadedness.  Denies leg pain or swelling.  Denies history of DVT, recent surgeries or immobilization.  Denies early family history of coronary artery disease, although reports that his father had an MI believes around the age of 61.  He used to smoke cigarettes and use crack cocaine, however has been clean since approximately 2014.  Denies cough.  Reports he had a heart catheterization done approximately 4-5 years ago which was within normal limits.  Has not otherwise been seeing a cardiologist.  Denies any exertional symptoms. No other exacerbating or relieving symptoms.  Pain 8/10  Past Medical History:  Diagnosis Date  . Emphysema   . Erectile dysfunction   . Hyperlipemia   . Hypertension   . Polysubstance abuse (HCC)    stopped using crack/cocaine December 4th 20-14  . PTSD (post-traumatic stress disorder)   . Sleep apnea     Patient Active Problem List   Diagnosis Date Noted  . Acute pain of right knee 12/23/2016  . 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 Blood in stool 10/05/2015  . Essential hypertension 06/18/2015  . Cephalalgia 06/18/2015  . Dyslipidemia 06/12/2015  . COPD mixed type (HCC) 05/28/2015  . Peripheral neuropathy 05/20/2015  . COPD (chronic obstructive pulmonary disease)  with emphysema (HCC) 05/20/2015  . PTSD (post-traumatic stress disorder) 05/20/2015  . Right carotid bruit 05/20/2015  . Frequent urination 05/20/2015  . Hematuria 07/15/2014  . History of tobacco abuse:  quit in ~ 06/2013  07/14/2014  . OSA on CPAP 07/14/2014  . Pre-diabetes 07/14/2014  . Claudication, R>L 07/14/2014  . Cocaine use, history of 03/26/2011  . Chest pain 03/26/2011    Past Surgical History:  Procedure Laterality Date  . LEFT HEART CATHETERIZATION WITH CORONARY ANGIOGRAM N/A 07/15/2014   Procedure: LEFT HEART CATHETERIZATION WITH CORONARY ANGIOGRAM;  Surgeon: Lennette Bihari, MD;  Location: [facility] CATH LAB;  Service: Cardiovascular;  Laterality: N/A;       Home Medications    Prior to Admission medications   Medication Sig Start Date End Date Taking? Authorizing Provider  albuterol (PROVENTIL HFA;VENTOLIN HFA) 108 (90 BASE) MCG/ACT inhaler Inhale 2 puffs into the lungs every 6 (six) hours as needed for shortness of breath.   Yes [provider]  albuterol (PROVENTIL) (2.5 MG/3ML) 0.083% nebulizer solution Take 2.5 mg by nebulization 2 (two) times daily.    Yes [provider]  albuterol-ipratropium (COMBIVENT) 18-103 MCG/ACT inhaler Inhale 1 puff into the lungs every 6 (six) hours as needed for wheezing. 08/18/15  Yes Michele Mcalpine, MD  aspirin 81 MG tablet Take 81 mg by mouth daily.   Yes [provider]  glucose blood (COOL BLOOD GLUCOSE TEST STRIPS) test strip Used to test blood sugar x2 daily---diagnosis code r73.03--for one touch verio flex  12/08/15  Yes Burns, [name], MD  ipratropium (ATROVENT) 0.02 % nebulizer solution Take 0.5 mg by nebulization 3 (three) times daily.   Yes [provider]  Lancets MISC Used to test blood sugar x2 daily---diagnosis code r73.03--for one touch verio flex 12/08/15  Yes Burns, [name], MD  lisinopril (PRINIVIL,ZESTRIL) 20 MG tablet Take 40 mg by mouth daily.    Yes [provider]  mirtazapine  (REMERON) 15 MG tablet Take 15 mg by mouth 2 (two) times daily.    Yes [provider]  NON FORMULARY at bedtime. CPAP - followed at [facility]   Yes [provider]  nortriptyline (PAMELOR) 10 MG capsule Take 1 capsule (10 mg total) by mouth at bedtime. 09/23/15  Yes Patel, Donika K, DO  sildenafil (VIAGRA) 100 MG tablet Take 100 mg by mouth daily as needed for erectile dysfunction.   Yes [provider]  amoxicillin-clavulanate (AUGMENTIN) 875-125 MG tablet Take 1 tablet by mouth every 12 (twelve) hours. Patient not taking: Reported on 12/22/2016 09/19/16   Antony Madura, PA-C  benzonatate (TESSALON) 100 MG capsule Take 1-2 capsules (100-200 mg total) by mouth 3 (three) times daily as needed for cough. Patient not taking: Reported on 12/22/2016 09/19/16   Antony Madura, PA-C  budesonide-formoterol [facility]) 160-4.5 MCG/ACT inhaler Inhale 2 puffs into the lungs 2 (two) times daily. Patient not taking: Reported on 09/19/2016 08/18/15   Michele Mcalpine, MD  levofloxacin (LEVAQUIN) 500 MG tablet Take 1 tablet (500 mg total) by mouth daily. Patient not taking: Reported on 12/22/2016 11/09/16   Derwood Kaplan, MD  lidocaine (LIDODERM) 5 % Place 1 patch onto the skin daily. Remove & Discard patch within 12 hours or as directed by MD Patient not taking: Reported on 12/22/2016 09/19/16   Antony Madura, PA-C  naproxen (NAPROSYN) 500 MG tablet Take 1 tablet (500 mg total) by mouth 2 (two) times daily as needed. Patient not taking: Reported on 05/18/2017 12/23/16   Myra Rude, MD  Polyethyl Glycol-Propyl Glycol 0.4-0.3 % SOLN Apply 2 drops to eye as needed. Patient not taking: Reported on 05/18/2017 08/25/16   Nche, Bonna Gains, NP  valsartan (DIOVAN) 80 MG tablet Take 1 tablet (80 mg total) by mouth daily. Patient not taking: Reported on 05/18/2017 05/26/15   [name], MD    Family History Family History  Problem Relation Age of Onset  . Diabetes Mother        Deceased  . Heart  disease Father        Deceased  . Heart attack Father        x5  . Cancer - Other Sister   . Healthy Son   . Healthy Daughter   . Heart attack Brother        Deceased  . Alcohol abuse Brother        Deceased    Social History Social History   Tobacco Use  . Smoking status: Former Smoker    Packs/day: 1.50    Years: 50.00    Pack years: 75.00    Types: Cigarettes    Start date: 04/26/1963    Last attempt to quit: 09/22/2013    Years since quitting: 3.6  . Smokeless tobacco: Never Used  . Tobacco comment: patient using the patch, losenges, and medication  Substance Use Topics  . Alcohol use: No    Alcohol/week: 0.0 oz    Comment: He was drinking 3-4 drinks nightly x 40 years, quit in 2014.    Marland Kitchen  Drug use: No    Comment: history of crack use and drug tested weekly at the [facility]     Allergies   Patient has no known allergies.   Review of Systems Review of Systems  Constitutional: Positive for fatigue. Negative for fever.  HENT: Negative for sore throat.   Eyes: Negative for visual disturbance.  Respiratory: Negative for cough and shortness of breath (no change from baseline).   Cardiovascular: Positive for chest pain. Negative for leg swelling.  Gastrointestinal: Negative for abdominal pain, nausea and vomiting.  Genitourinary: Negative for difficulty urinating.  Musculoskeletal: Negative for back pain and neck stiffness.  Skin: Negative for rash.  Neurological: Negative for syncope and headaches.     Physical Exam Updated Vital Signs BP (!) 151/86   Pulse 60   Temp 98.1 F (36.7 C) (Oral)   Resp (!) 26   Ht 5' 5.5" (1.664 m)   Wt 73 kg (161 lb)   SpO2 96%   BMI 26.38 kg/m   Physical Exam  Constitutional: He is oriented to person, place, and time. He appears well-developed and well-nourished. No distress.  HENT:  Head: Normocephalic and atraumatic.  Eyes: Conjunctivae and EOM are normal.  Neck: Normal range of motion.  Cardiovascular: Normal rate, regular  rhythm, normal heart sounds and intact distal pulses. Exam reveals no gallop and no friction rub.  No murmur heard. Pulmonary/Chest: Effort normal and breath sounds normal. No respiratory distress. He has no wheezes. He has no rales.  Abdominal: Soft. He exhibits no distension. There is no tenderness. There is no guarding.  Musculoskeletal: He exhibits no edema.  Neurological: He is alert and oriented to person, place, and time.  Skin: Skin is warm and dry. He is not diaphoretic.  Nursing note and vitals reviewed.    ED Treatments / Results  Labs (all labs ordered are listed, but only abnormal results are displayed) Labs Reviewed  BASIC METABOLIC PANEL - Abnormal; Notable for the following components:      Result Value   Creatinine, Ser 1.44 (*)    GFR calc non Af Amer 50 (*)    GFR calc Af Amer 57 (*)    All other components within normal limits  CBC  I-STAT TROPONIN, ED  I-STAT TROPONIN, ED    EKG  EKG Interpretation  Date/Time:  Thursday May 18 2017 11:39:20 EST Ventricular Rate:  70 PR Interval:    QRS Duration: 76 QT Interval:  404 QTC Calculation: 439 R Axis:   39 Text Interpretation:  Sinus rhythm Borderline low voltage, extremity leads Abnormal R-wave progression, early transition Nonspecific T abnormalities, lateral leads No significant change since last tracing Confirmed by Alvira Monday ([id number]) on 05/18/2017 12:40:23 PM       Radiology Dg Chest 2 View  Result Date: 05/18/2017 CLINICAL DATA:  Chest pain EXAM: CHEST  2 VIEW COMPARISON:  December 22, 2016 FINDINGS: There is mild scarring in the left mid lung. There is no edema or consolidation. The heart size and pulmonary vascularity are normal. No adenopathy. No evident bone lesions. IMPRESSION: Mild scarring left mid lung. Lungs elsewhere clear. Stable cardiac silhouette. Electronically Signed   By: Bretta Bang III M.D.   On: 05/18/2017 12:16    Procedures Procedures (including critical care  time)  Medications Ordered in ED Medications  nitroGLYCERIN (NITROSTAT) SL tablet 0.4 mg (0.4 mg Sublingual Given 05/18/17 1308)  aspirin chewable tablet 324 mg (324 mg Oral Given 05/18/17 1307)     Initial Impression /  Assessment and Plan / ED Course  I have reviewed the triage vital signs and the nursing notes.  Pertinent labs & imaging results that were available during my care of the patient were reviewed by me and considered in my medical decision making (see chart for details).    66yo male with history of hypertension, hyperlipidemia, PTSD, presents with concern for chest pain.  Differential diagnosis for chest pain includes pulmonary embolus, dissection, pneumothorax, pneumonia, ACS, myocarditis, pericarditis.  EKG was done and evaluate by me and showed no acute ST changes and no signs of pericarditis. Chest x-ray was done and evaluated by me and radiology and showed  no sign of pneumonia or pneumothorax. Patient is low risk [place], no dyspnea, no hypoxia, no risk factors of PE, and have low suspicion for PE.  Patient is low risk HEART score (3) and had delta troponins which were both negative. Given this evaluation, history and physical have low suspicion for pulmonary embolus, pneumonia, ACS, myocarditis, pericarditis, dissection.  Patient chest pain free at time of discharge.   Patient has spoken to his PCP's office regarding his chest pain and they are organizing outpatient stress testing.  Recommend close follow up, outpatient stress testing, discussed reasons to return in detail.   Final Clinical Impressions(s) / ED Diagnoses   Final diagnoses:  Chest pain, unspecified type    ED Discharge Orders    None       Alvira Monday, MD 05/18/17 1904

## 2017-05-22 NOTE — Progress Notes (Signed)
Subjective:    Patient ID: Gerald Williams, male    DOB: 07-Sep-1950, 67 y.o.   MRN: [id number]  HPI The patient is here for follow up from the ED.  05/18/17 he went to the ED for chest pain.  He had sharp chest pain for 3 days that was intermittent. He did take a shingles vaccine the day before and wonders if that was evaluated.  The pain came spontaneously.  It was located in the left chest and radiated down his left arm.  He denies increased SOB from his baseline COPD.  He denies nausea, diaphoresis, lightheadedness, leg swelling.  He had a cardiac cath 4-5 years ago which was normal.  There were no exertional symptoms, exacerbating or alleviating symptoms.  His pain was 8/10.    EKG showed sinus rhythm, abnormal R wave progression, non specific T abnormalities in lateral leads.  No change from prior EKG.   CXR was normal.  troponins x 2 negative. He was calculated to have a low risk of CAD and had no symptoms of PE.  He was discharged home with follow up here to have an outpatient stress test ordered.   He has not had any chest pain since leaving the ED.  He does have chronic SOB due to his COPD.  It is worse in the cold weather.  He is following with pulmonary at the [place]. he thinks he needs oxygen, but his 6-minute walk test did not show that he qualified for it.  He notices that his shortness of breath is much worse in the cold weather or hot weather.  Headaches:  He sleeps with his Cpap.  His headaches started on month ago.  He wakes up with the headaches.  He takes 2 81 mg ASA daily since leaving the emergency room.  Then he takes two Excedrin migraine pills almost daily - about every other day.   Headaches right frontal region.  The headaches go away during the day and he does not have a headache when he goes to bed, but it will be there in the morning.   Medications and allergies reviewed with patient and updated if appropriate.  Patient Active Problem List   Diagnosis Date Noted  .  Acute pain of right knee 12/23/2016  . 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 Blood in stool 10/05/2015  . Essential hypertension 06/18/2015  . Cephalalgia 06/18/2015  . Dyslipidemia 06/12/2015  . COPD mixed type (HCC) 05/28/2015  . Peripheral neuropathy 05/20/2015  . COPD (chronic obstructive pulmonary disease) with emphysema (HCC) 05/20/2015  . PTSD (post-traumatic stress disorder) 05/20/2015  . Right carotid bruit 05/20/2015  . Frequent urination 05/20/2015  . Hematuria 07/15/2014  . History of tobacco abuse:  quit in ~ 06/2013  07/14/2014  . OSA on CPAP 07/14/2014  . Pre-diabetes 07/14/2014  . Claudication, R>L 07/14/2014  . Cocaine use, history of 03/26/2011  . Chest pain 03/26/2011    Current Outpatient Medications on File Prior to Visit  Medication Sig Dispense Refill  . albuterol (PROVENTIL HFA;VENTOLIN HFA) 108 (90 BASE) MCG/ACT inhaler Inhale 2 puffs into the lungs every 6 (six) hours as needed for shortness of breath.    Marland Kitchen albuterol (PROVENTIL) (2.5 MG/3ML) 0.083% nebulizer solution Take 2.5 mg by nebulization 2 (two) times daily.     Marland Kitchen albuterol-ipratropium (COMBIVENT) 18-103 MCG/ACT inhaler Inhale 1 puff into the lungs every 6 (six) hours as needed for wheezing. 1 Inhaler 5  . aspirin 81  MG tablet Take 81 mg by mouth daily.    . budesonide-formoterol (SYMBICORT) 160-4.5 MCG/ACT inhaler Inhale 2 puffs into the lungs 2 (two) times daily. 1 Inhaler 5  . glucose blood (COOL BLOOD GLUCOSE TEST STRIPS) test strip Used to test blood sugar x2 daily---diagnosis code r73.03--for one touch verio flex 200 each 3  . ipratropium (ATROVENT) 0.02 % nebulizer solution Take 0.5 mg by nebulization 3 (three) times daily.    . Lancets MISC Used to test blood sugar x2 daily---diagnosis code r73.03--for one touch verio flex 200 each 3  . lisinopril (PRINIVIL,ZESTRIL) 20 MG tablet Take 40 mg by mouth daily.     . mirtazapine (REMERON) 15 MG tablet Take 15 mg by mouth 2 (two)  times daily.     . NON FORMULARY at bedtime. CPAP - [facility] at [facility]    . nortriptyline (PAMELOR) 10 MG capsule Take 1 capsule (10 mg total) by mouth at bedtime. 30 capsule 5  . Polyethyl Glycol-Propyl Glycol 0.4-0.3 % SOLN Apply 2 drops to eye as needed. 15 mL 0  . sildenafil (VIAGRA) 100 MG tablet Take 100 mg by mouth daily as needed for erectile dysfunction.    . valsartan (DIOVAN) 80 MG tablet Take 1 tablet (80 mg total) by mouth daily. 30 tablet 3   No current facility-administered medications on file prior to visit.     Past Medical History:  Diagnosis Date  . Emphysema   . Erectile dysfunction   . Hyperlipemia   . Hypertension   . Polysubstance abuse (HCC)    stopped using crack/cocaine December 4th 20-14  . PTSD (post-traumatic stress disorder)   . Sleep apnea     Past Surgical History:  Procedure Laterality Date  . LEFT HEART CATHETERIZATION WITH CORONARY ANGIOGRAM N/A 07/15/2014   Procedure: LEFT HEART CATHETERIZATION WITH CORONARY ANGIOGRAM;  Surgeon: Lennette Bihari, MD;  Location: [facility] CATH LAB;  Service: Cardiovascular;  Laterality: N/A;    Social History   Socioeconomic History  . Marital status: Married    Spouse name: None  . Number of children: 4  . Years of education: None  . Highest education level: None  Social Needs  . Financial resource strain: None  . Food insecurity - worry: None  . Food insecurity - inability: None  . Transportation needs - medical: None  . Transportation needs - non-medical: None  Occupational History  . Occupation: Investment banker, operational - retired  Tobacco Use  . Smoking status: Former Smoker    Packs/day: 1.50    Years: 50.00    Pack years: 75.00    Types: Cigarettes    Start date: 04/26/1963    Last attempt to quit: 09/22/2013    Years since quitting: 3.6  . Smokeless tobacco: Never Used  . Tobacco comment: patient using the patch, losenges, and medication  Substance and Sexual Activity  . Alcohol use: No    Alcohol/week: 0.0 oz     Comment: He was drinking 3-4 drinks nightly x 40 years, quit in 2014.    . Drug use: No    Comment: history of crack use and drug tested weekly at the [place]  . Sexual activity: None  Other Topics Concern  . None  Social History Narrative   No regular exercise   epworth sleepiness scale = 13 (06/12/15)   Lives alone in a one story home.  Has 4 children and 9 grandchildren.  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  Wants to keep working but it  is hard for him to stand.  Education: 14 years and 20 years of [employer].      Family History  Problem Relation Age of Onset  . Diabetes Mother        Deceased  . Heart disease Father        Deceased  . Heart attack Father        x5  . Cancer - Other Sister   . Healthy Son   . Healthy Daughter   . Heart attack Brother        Deceased  . Alcohol abuse Brother        Deceased    Review of Systems  Constitutional: Negative for chills and fever.  Eyes: Negative for visual disturbance.  Respiratory: Positive for shortness of breath and wheezing. Negative for cough.   Cardiovascular: Positive for chest pain (none since ED). Negative for palpitations and leg swelling.  Neurological: Positive for headaches. Negative for dizziness, weakness, light-headedness and numbness.       Objective:   Vitals:   05/23/17 1008  BP: 140/80  Pulse: 68  Resp: 16  Temp: 97.8 F (36.6 C)  SpO2: 94%   Wt Readings from Last 3 Encounters:  05/23/17 170 lb (77.1 kg)  05/18/17 161 lb (73 kg)  12/23/16 163 lb (73.9 kg)   Body mass index is 27.86 kg/m.   Physical Exam    Constitutional: Appears well-developed and well-nourished. No distress.  HENT:  Head: Normocephalic and atraumatic.  Neck: Neck supple. No tracheal deviation present. No thyromegaly present.  No cervical lymphadenopathy Cardiovascular: Normal rate, regular rhythm and normal heart sounds.   No murmur heard. No carotid bruit .  No edema Pulmonary/Chest: Effort normal and breath sounds normal.  No respiratory distress. No has no wheezes. No rales.  Neurological: Normal strength and sensation all extremities, gait normal Skin: Skin is warm and dry. Not diaphoretic.  Psychiatric: Normal mood and affect. Behavior is normal.      Assessment & Plan:    See Problem List for Assessment and Plan of chronic medical problems.

## 2017-05-22 NOTE — Patient Instructions (Addendum)
  Medications reviewed and updated.  Changes include stop taking the Excedrin migraine pills.   A referral was ordered for cardiology

## 2017-05-23 ENCOUNTER — Encounter: Payer: Self-pay | Admitting: Internal Medicine

## 2017-05-23 ENCOUNTER — Ambulatory Visit: Payer: Medicare Other | Admitting: Internal Medicine

## 2017-05-23 VITALS — BP 140/80 | HR 68 | Temp 97.8°F | Resp 16 | Wt 170.0 lb

## 2017-05-23 DIAGNOSIS — R079 Chest pain, unspecified: Secondary | ICD-10-CM

## 2017-05-23 DIAGNOSIS — I10 Essential (primary) hypertension: Secondary | ICD-10-CM | POA: Diagnosis not present

## 2017-05-23 DIAGNOSIS — Z9989 Dependence on other enabling machines and devices: Secondary | ICD-10-CM | POA: Diagnosis not present

## 2017-05-23 DIAGNOSIS — G4733 Obstructive sleep apnea (adult) (pediatric): Secondary | ICD-10-CM | POA: Diagnosis not present

## 2017-05-23 DIAGNOSIS — R519 Headache, unspecified: Secondary | ICD-10-CM | POA: Insufficient documentation

## 2017-05-23 DIAGNOSIS — R51 Headache: Secondary | ICD-10-CM | POA: Diagnosis not present

## 2017-05-23 NOTE — Assessment & Plan Note (Signed)
Uses CPAP nightly Managed through pulmonary at the [facility]

## 2017-05-23 NOTE — Assessment & Plan Note (Signed)
Blood pressure borderline here high here today We will continue current medications and monitor

## 2017-05-23 NOTE — Assessment & Plan Note (Signed)
For the past month he has been experiencing daily headaches in the morning in the right frontal region He is compliant with his CPAP He has been taking Excedrin Migraine frequently, approximately every other day for this.  This does resolve the headaches, but they recur the next morning Possible rebound headaches-discontinue Excedrin Migraine He will let me know if the headaches persist

## 2017-05-23 NOTE — Assessment & Plan Note (Signed)
Here for follow-up from the emergency room for chest pain-workup in the emergency room was negative for cardiac cause Chest pain was somewhat atypical and he states today that the day prior to the chest pain starting he did have a shingles vaccine and this may have been the cause Still warrants possible stress test Cardiac cath a few years ago was essentially normal Will refer to cardiology for further evaluation, possible stress test Has been chest pain-free since leaving the hospital

## 2017-06-09 ENCOUNTER — Encounter: Payer: Self-pay | Admitting: Internal Medicine

## 2017-07-13 ENCOUNTER — Ambulatory Visit (INDEPENDENT_AMBULATORY_CARE_PROVIDER_SITE_OTHER): Payer: Medicare Other | Admitting: *Deleted

## 2017-07-13 VITALS — BP 132/90 | HR 70 | Resp 18 | Ht 65.0 in | Wt 167.0 lb

## 2017-07-13 DIAGNOSIS — Z0000 Encounter for general adult medical examination without abnormal findings: Secondary | ICD-10-CM

## 2017-07-13 NOTE — Progress Notes (Addendum)
Subjective:   Gerald Williams is a 67 y.o. male who presents for Medicare Annual/Subsequent preventive examination.  Review of Systems:  No ROS.  Medicare Wellness Visit. Additional risk factors are reflected in the social history.  Cardiac Risk Factors include: advanced age (>82men, >13 women);diabetes mellitus;hypertension;male gender;dyslipidemia  Sleep patterns: gets up 1-2 times nightly to void and sleeps 6-7 hours nightly.    Home Safety/Smoke Alarms: Feels safe in home. Smoke alarms in place.  Living environment; residence and Firearm Safety: 1-story house/ trailer, no firearms., Lives with wife, no needs for DME, good support system Seat Belt Safety/Bike Helmet: Wears seat belt.   PSA-  Lab Results  Component Value Date   PSA 0.49 05/26/2015       Objective:    Vitals: BP 132/90   Pulse 70   Resp 18   Ht 5\' 5"  (1.651 m)   Wt 167 lb (75.8 kg)   SpO2 98%   BMI 27.79 kg/m   Body mass index is 27.79 kg/m.  Advanced Directives 07/13/2017 05/18/2017 12/22/2016 11/09/2016 05/28/2016 09/28/2015 07/14/2014  Does Patient Have a Medical Advance Directive? Yes No No Yes Yes Yes Yes  Type of Estate agent of [place];Living will - - Living will Living will - Living will  Does patient want to make changes to medical advance directive? - - - - - - No - Patient declined  Copy of Healthcare Power of Attorney in Chart? No - copy requested - - - - - No - copy requested  Would patient like information on creating a medical advance directive? - No - Patient declined - - - - -    Tobacco Social History   Tobacco Use  Smoking Status Former Smoker  . Packs/day: 1.50  . Years: 50.00  . Pack years: 75.00  . Types: Cigarettes  . Start date: 04/26/1963  . Last attempt to quit: 09/22/2013  . Years since quitting: 3.8  Smokeless Tobacco Never Used  Tobacco Comment   patient using the patch, losenges, and medication     Counseling given: Not Answered Comment: patient  using the patch, losenges, and medication  Past Medical History:  Diagnosis Date  . Emphysema   . Erectile dysfunction   . Hyperlipemia   . Hypertension   . Polysubstance abuse (HCC)    stopped using crack/cocaine December 4th 20-14  . PTSD (post-traumatic stress disorder)   . Sleep apnea    Past Surgical History:  Procedure Laterality Date  . LEFT HEART CATHETERIZATION WITH CORONARY ANGIOGRAM N/A 07/15/2014   Procedure: LEFT HEART CATHETERIZATION WITH CORONARY ANGIOGRAM;  Surgeon: Lennette Bihari, MD;  Location: [facility] CATH LAB;  Service: Cardiovascular;  Laterality: N/A;   Family History  Problem Relation Age of Onset  . Diabetes Mother        Deceased  . Heart disease Father        Deceased  . Heart attack Father        x5  . Cancer - Other Sister   . Healthy Son   . Healthy Daughter   . Heart attack Brother        Deceased  . Alcohol abuse Brother        Deceased   Social History   Socioeconomic History  . Marital status: Married    Spouse name: Not on file  . Number of children: 4  . Years of education: Not on file  . Highest education level: Not on file  Occupational History  .  Occupation: Investment banker, operational - retired  Engineer, production  . Financial resource strain: Not hard at all  . Food insecurity:    Worry: Never true    Inability: Never true  . Transportation needs:    Medical: No    Non-medical: No  Tobacco Use  . Smoking status: Former Smoker    Packs/day: 1.50    Years: 50.00    Pack years: 75.00    Types: Cigarettes    Start date: 04/26/1963    Last attempt to quit: 09/22/2013    Years since quitting: 3.8  . Smokeless tobacco: Never Used  . Tobacco comment: patient using the patch, losenges, and medication  Substance and Sexual Activity  . Alcohol use: No    Alcohol/week: 0.0 oz    Comment: He was drinking 3-4 drinks nightly x 40 years, quit in 2014.    . Drug use: No    Comment: history of crack use and drug tested weekly at the [place]  . Sexual activity: Yes    Lifestyle  . Physical activity:    Days per week: 4 days    Minutes per session: 30 min  . Stress: Not at all  Relationships  . Social connections:    Talks on phone: More than three times a week    Gets together: More than three times a week    Attends religious service: More than 4 times per year    Active member of club or organization: Yes    Attends meetings of clubs or organizations: More than 4 times per year    Relationship status: Married  Other Topics Concern  . Not on file  Social History Narrative    Education: 14 years and 20 years of [employer]. Recently married.     Outpatient Encounter Medications as of 07/13/2017  Medication Sig  . acetaminophen (TYLENOL) 325 MG tablet Take 650 mg by mouth every 6 (six) hours as needed.  Marland Kitchen albuterol (PROVENTIL HFA;VENTOLIN HFA) 108 (90 BASE) MCG/ACT inhaler Inhale 2 puffs into the lungs every 6 (six) hours as needed for shortness of breath.  Marland Kitchen albuterol (PROVENTIL) (2.5 MG/3ML) 0.083% nebulizer solution Take 2.5 mg by nebulization 2 (two) times daily.   Marland Kitchen albuterol-ipratropium (COMBIVENT) 18-103 MCG/ACT inhaler Inhale 1 puff into the lungs every 6 (six) hours as needed for wheezing.  Marland Kitchen aspirin 81 MG tablet Take 81 mg by mouth daily.  . budesonide-formoterol (SYMBICORT) 160-4.5 MCG/ACT inhaler Inhale 2 puffs into the lungs 2 (two) times daily.  Marland Kitchen glucose blood (COOL BLOOD GLUCOSE TEST STRIPS) test strip Used to test blood sugar x2 daily---diagnosis code r73.03--for one touch verio flex  . ipratropium (ATROVENT) 0.02 % nebulizer solution Take 0.5 mg by nebulization 3 (three) times daily.  . Lancets MISC Used to test blood sugar x2 daily---diagnosis code r73.03--for one touch verio flex  . lisinopril (PRINIVIL,ZESTRIL) 20 MG tablet Take 40 mg by mouth daily.   . mirtazapine (REMERON) 15 MG tablet Take 15 mg by mouth 2 (two) times daily.   . NON FORMULARY at bedtime. CPAP - followed at [facility]  . nortriptyline (PAMELOR) 10 MG capsule Take 1  capsule (10 mg total) by mouth at bedtime.  Bertram Gala Glycol-Propyl Glycol 0.4-0.3 % SOLN Apply 2 drops to eye as needed.  . sildenafil (VIAGRA) 100 MG tablet Take 100 mg by mouth daily as needed for erectile dysfunction.  . valsartan (DIOVAN) 80 MG tablet Take 1 tablet (80 mg total) by mouth daily.   No facility-administered encounter  medications on file as of 07/13/2017.     Activities of Daily Living In your present state of health, do you have any difficulty performing the following activities: 07/13/2017  Hearing? N  Vision? N  Difficulty concentrating or making decisions? N  Walking or climbing stairs? N  Dressing or bathing? N  Doing errands, shopping? N  Preparing Food and eating ? N  Using the Toilet? N  In the past six months, have you accidently leaked urine? N  Do you have problems with loss of bowel control? N  Managing your Medications? N  Managing your Finances? N  Housekeeping or managing your Housekeeping? N  Some recent data might be hidden    Patient Care Team: [name], MD as PCP - [facility]erring [facility] ([facility])   Assessment:   This is a routine wellness examination for Achillies. Physical assessment deferred to PCP.   Exercise Activities and Dietary recommendations Current Exercise Habits: Home exercise routine, Type of exercise: walking, Time (Minutes): 30, Frequency (Times/Week): 5, Weekly Exercise (Minutes/Week): 150, Intensity: Mild, Exercise limited by: respiratory conditions(s)  Diet (meal preparation, eat out, water intake, caffeinated beverages, dairy products, fruits and vegetables): in general, a "healthy" diet  , well balanced, reports he has dietician he sees at the [place]. Also, his wife is a Engineer, civil (consulting) and he is working to improve his diet and lifestyle.   Reviewed heart healthy and diabetic diet, encouraged patient to increase daily water intake.  Goals    . Patient Stated     I want to  travel with my wife, continue to worship God, enjoy life and family. Continue to eat healthy and exercise.        Fall Risk Fall Risk  07/13/2017 05/23/2017 01/01/2016 09/28/2015 09/23/2015  Falls in the past year? No No No - Yes  Number falls in past yr: - - - 2 or more 2 or more  Injury with Fall? - - - Yes Yes  Risk Factor Category  - - - - High Fall Risk  Risk for fall due to : - - Impaired balance/gait - Impaired balance/gait  Follow up - - - Education provided Falls evaluation completed;Education provided;Falls prevention discussed    Depression Screen PHQ 2/9 Scores 07/13/2017 05/23/2017 04/10/2014 12/23/2013  PHQ - 2 Score 1 0 0 0  PHQ- 9 Score 2 - - -    Cognitive Function       Ad8 score reviewed for issues:  Issues making decisions: no  Less interest in hobbies / activities: no  Repeats questions, stories (family complaining): no  Trouble using ordinary gadgets (microwave, computer, phone):no  Forgets the month or year: no  Mismanaging finances: no  Remembering appts: no  Daily problems with thinking and/or memory: no Ad8 score is= 0    Immunization History  Administered Date(s) Administered  . Influenza,inj,Quad PF,6+ Mos 01/11/2016  . Influenza-Unspecified 01/03/2015, 12/24/2016   Screening Tests Health Maintenance  Topic Date Due  . Hepatitis C Screening  [birth year]  . HIV Screening  01/05/1967  . TETANUS/TDAP  01/05/1971  . COLONOSCOPY  01/04/2002  . PNA vac Low Risk Adult (1 of 2 - PCV13) 01/04/2017  . INFLUENZA VACCINE  Completed      Plan:    [facility] Lawerance Bach Fax #: [phone]. To update health maintenance.  Continue doing brain stimulating activities (puzzles, reading, adult coloring books, staying active) to keep memory  sharp.   Continue to eat heart healthy diet (full of fruits, vegetables, whole grains, lean protein, water--limit salt, fat, and sugar intake) and increase physical activity as  tolerated.  I have personally reviewed and noted the following in the patient's chart:   . Medical and social history . Use of alcohol, tobacco or illicit drugs  . Current medications and supplements . Functional ability and status . Nutritional status . Physical activity . Advanced directives . List of other physicians . Vitals . Screenings to include cognitive, depression, and falls . Referrals and appointments  In addition, I have reviewed and discussed with patient certain preventive protocols, quality metrics, and best practice recommendations. A written personalized care plan for preventive services as well as general preventive health recommendations were provided to patient.     Wanda Plump, RN  07/13/2017   Medical screening examination/treatment/procedure(s) were performed by non-physician practitioner and as supervising physician I was immediately available for consultation/collaboration. I agree with above. [name], MD

## 2017-07-13 NOTE — Patient Instructions (Signed)
[facility] Dr. Lawerance Bach Fax #: [phone].  Continue doing brain stimulating activities (puzzles, reading, adult coloring books, staying active) to keep memory sharp.   Continue to eat heart healthy diet (full of fruits, vegetables, whole grains, lean protein, water--limit salt, fat, and sugar intake) and increase physical activity as tolerated.   Gerald Williams , Thank you for taking time to come for your Medicare Wellness Visit. I appreciate your ongoing commitment to your health goals. Please review the following plan we discussed and let me know if I can assist you in the future.   These are the goals we discussed: Goals    . Patient Stated     I want to travel with my wife, continue to worship God, enjoy life and family. Continue to eat healthy and exercise.        This is a list of the screening recommended for you and due dates:  Health Maintenance  Topic Date Due  .  Hepatitis C: One time screening is recommended by Center for Disease Control  (CDC) for  adults born from [birth year] through 1964.   [birth year]  . HIV Screening  01/05/1967  . Tetanus Vaccine  01/05/1971  . Colon Cancer Screening  01/04/2002  . Pneumonia vaccines (1 of 2 - PCV13) 01/04/2017  . Flu Shot  Completed

## 2017-07-18 NOTE — Progress Notes (Deleted)
Cardiology Office Note   Date:  07/18/2017   ID:  Gerald Williams, DOB 03-Nov-1950, MRN [id number]  PCP:  [name], MD  Cardiologist: Dr. Royann Shivers No chief complaint on file.    History of Present Illness: Gerald Williams is a 67 y.o. male who presents for follow-up after admission in 2016, for chest pain, with other history to include polysubstance abuse, EtOH use, COPD, OSA, prediabetes, and hyperlipidemia.  Patient also had symptoms of dyspnea.  In the setting of unstable angina with multiple cardiovascular risk factors the patient was planned for cardiac catheterization.  Cardiac catheterization dated 07/15/2014 ANGIOGRAPHY:   The left main coronary artery was a large angiographically normal vessel and bifurcated into the LAD and left circumflex coronary artery.   The LAD was angiographically normal and gave rise to 2 major diagonal vessels and several septal perforating arteries. The vessel extended to and wrapped around the LV apex.   The left circumflex coronary artery was an angiographically normal dominant vessel and gave rise to two major obtuse marginal branches and ended in the PDA and PLA vessel.  The RCA was angiographically normal nondominant vessel.  Left ventriculography revealed normal global LV contractility without focal segmental wall motion abnormalities. There was no evidence for mitral regurgitation.  Ejection fraction is 60%.   Patient was found to have normal coronary arteries and continued on medical management for hypertension and hyperlipidemia.  He was ruled out for cardiac etiology.  Past Medical History:  Diagnosis Date  . Emphysema   . Erectile dysfunction   . Hyperlipemia   . Hypertension   . Polysubstance abuse (HCC)    stopped using crack/cocaine December 4th 20-14  . PTSD (post-traumatic stress disorder)   . Sleep apnea     Past Surgical History:  Procedure Laterality Date  . LEFT HEART CATHETERIZATION WITH CORONARY ANGIOGRAM N/A  07/15/2014   Procedure: LEFT HEART CATHETERIZATION WITH CORONARY ANGIOGRAM;  Surgeon: Lennette Bihari, MD;  Location: [facility] CATH LAB;  Service: Cardiovascular;  Laterality: N/A;     Current Outpatient Medications  Medication Sig Dispense Refill  . acetaminophen (TYLENOL) 325 MG tablet Take 650 mg by mouth every 6 (six) hours as needed.    Marland Kitchen albuterol (PROVENTIL HFA;VENTOLIN HFA) 108 (90 BASE) MCG/ACT inhaler Inhale 2 puffs into the lungs every 6 (six) hours as needed for shortness of breath.    Marland Kitchen albuterol (PROVENTIL) (2.5 MG/3ML) 0.083% nebulizer solution Take 2.5 mg by nebulization 2 (two) times daily.     Marland Kitchen albuterol-ipratropium (COMBIVENT) 18-103 MCG/ACT inhaler Inhale 1 puff into the lungs every 6 (six) hours as needed for wheezing. 1 Inhaler 5  . aspirin 81 MG tablet Take 81 mg by mouth daily.    . budesonide-formoterol (SYMBICORT) 160-4.5 MCG/ACT inhaler Inhale 2 puffs into the lungs 2 (two) times daily. 1 Inhaler 5  . glucose blood (COOL BLOOD GLUCOSE TEST STRIPS) test strip Used to test blood sugar x2 daily---diagnosis code r73.03--for one touch verio flex 200 each 3  . ipratropium (ATROVENT) 0.02 % nebulizer solution Take 0.5 mg by nebulization 3 (three) times daily.    . Lancets MISC Used to test blood sugar x2 daily---diagnosis code r73.03--for one touch verio flex 200 each 3  . lisinopril (PRINIVIL,ZESTRIL) 20 MG tablet Take 40 mg by mouth daily.     . mirtazapine (REMERON) 15 MG tablet Take 15 mg by mouth 2 (two) times daily.     . NON FORMULARY at bedtime. CPAP - followed at [place]    .  nortriptyline (PAMELOR) 10 MG capsule Take 1 capsule (10 mg total) by mouth at bedtime. 30 capsule 5  . Polyethyl Glycol-Propyl Glycol 0.4-0.3 % SOLN Apply 2 drops to eye as needed. 15 mL 0  . sildenafil (VIAGRA) 100 MG tablet Take 100 mg by mouth daily as needed for erectile dysfunction.    . valsartan (DIOVAN) 80 MG tablet Take 1 tablet (80 mg total) by mouth daily. 30 tablet 3   No current  facility-administered medications for this visit.     Allergies:   Patient has no known allergies.    Social History:  The patient  reports that he quit smoking about 3 years ago. His smoking use included cigarettes. He started smoking about 54 years ago. He has a 75.00 pack-year smoking history. He has never used smokeless tobacco. He reports that he does not drink alcohol or use drugs.   Family History:  The patient's family history includes Alcohol abuse in his brother; Cancer - Other in his sister; Diabetes in his mother; Healthy in his daughter and son; Heart attack in his brother and father; Heart disease in his father.    ROS: All other systems are reviewed and negative. Unless otherwise mentioned in H&P    PHYSICAL EXAM: VS:  There were no vitals taken for this visit. , BMI There is no height or weight on file to calculate BMI. GEN: Well nourished, well developed, in no acute distress HEENT: normal Neck: no JVD, carotid bruits, or masses Cardiac: ***RRR; no murmurs, rubs, or gallops,no edema  Respiratory:  clear to auscultation bilaterally, normal work of breathing GI: soft, nontender, nondistended, + BS MS: no deformity or atrophy Skin: warm and dry, no rash Neuro:  Strength and sensation are intact Psych: euthymic mood, full affect   EKG:  EKG {ACTION; IS/IS [id number]} ordered today. The ekg ordered today demonstrates ***   Recent Labs: 05/18/2017: BUN 13; Creatinine, Ser 1.44; Hemoglobin 14.1; Platelets 218; Potassium 4.4; Sodium 144    Lipid Panel    Component Value Date/Time   CHOL 210 (H) 05/26/2015 0914   TRIG 62.0 05/26/2015 0914   HDL 60.90 05/26/2015 0914   CHOLHDL 3 05/26/2015 0914   VLDL 12.4 05/26/2015 0914   LDLCALC 136 (H) 05/26/2015 0914      Wt Readings from Last 3 Encounters:  07/13/17 167 lb (75.8 kg)  05/23/17 170 lb (77.1 kg)  05/18/17 161 lb (73 kg)      Other studies Reviewed: Additional studies/ records that were reviewed today  include: ***. Review of the above records demonstrates: ***   ASSESSMENT AND PLAN:  1.  ***   Current medicines are reviewed at length with the patient today.    Labs/ tests ordered today include: *** Bettey Mare. Liborio Nixon, ANP, AACC   07/18/2017 1:37 PM    [facility] [address] [place], [place] [address] Phone: [phone]; Fax: [phone]

## 2017-07-20 ENCOUNTER — Ambulatory Visit: Payer: Medicare Other | Admitting: Adult Health

## 2017-10-09 ENCOUNTER — Telehealth (HOSPITAL_COMMUNITY): Payer: Self-pay | Admitting: Internal Medicine

## 2017-10-11 ENCOUNTER — Ambulatory Visit (HOSPITAL_COMMUNITY): Payer: Medicare Other

## 2017-10-13 ENCOUNTER — Telehealth (HOSPITAL_COMMUNITY): Payer: Self-pay

## 2017-10-13 NOTE — Telephone Encounter (Signed)
Patient called to reschedule orientation for Pulmonary Rehab. Scheduled orientation on 10/23/17 at 9:30am. Patient will attend the 10:30am exc class.

## 2017-10-23 ENCOUNTER — Encounter (HOSPITAL_COMMUNITY): Payer: Self-pay

## 2017-10-23 ENCOUNTER — Encounter (HOSPITAL_COMMUNITY)
Admission: RE | Admit: 2017-10-23 | Discharge: 2017-10-23 | Disposition: A | Discharge status: Home / Self Care | Payer: No Typology Code available for payment source | Source: Ambulatory Visit | Attending: Pulmonary Disease | Admitting: Pulmonary Disease

## 2017-10-23 VITALS — BP 153/80 | HR 62 | Ht 66.0 in | Wt 168.7 lb

## 2017-10-23 DIAGNOSIS — G4730 Sleep apnea, unspecified: Secondary | ICD-10-CM | POA: Insufficient documentation

## 2017-10-23 DIAGNOSIS — F4310 Post-traumatic stress disorder, unspecified: Secondary | ICD-10-CM | POA: Insufficient documentation

## 2017-10-23 DIAGNOSIS — Z87891 Personal history of nicotine dependence: Secondary | ICD-10-CM | POA: Insufficient documentation

## 2017-10-23 DIAGNOSIS — E785 Hyperlipidemia, unspecified: Secondary | ICD-10-CM | POA: Insufficient documentation

## 2017-10-23 DIAGNOSIS — I10 Essential (primary) hypertension: Secondary | ICD-10-CM | POA: Insufficient documentation

## 2017-10-23 DIAGNOSIS — J449 Chronic obstructive pulmonary disease, unspecified: Secondary | ICD-10-CM | POA: Insufficient documentation

## 2017-10-23 NOTE — Progress Notes (Signed)
Gerald Williams 67 y.o. male Pulmonary Rehab Orientation Note Patient arrived today in Cardiac and Pulmonary Rehab for orientation to Pulmonary Rehab. He walked from [employer] independently, stopping to rest several times, but tolerated well. He does not carry portable oxygen. Per pt, he used oxygen while exercising with [place] in pulmonary rehab  3 years ago when he came through our program.  [facility] and said patient did not qualify for oxygen and took it away.  He reports when he is outside in the heat his spo2 in in the high 70's to low 80's.  We will perform a walk test before beginning exercise.  Color good, skin warm and dry. Patient is oriented to time and place. Patient's medical history, psychosocial health, and medications reviewed. Psychosocial assessment reveals pt lives with their spouse. Pt is currently retired as a Investment banker, operational. Pt hobbies include reading his bible and activities at church. Pt reports his stress level is low. Areas of stress/anxiety include Health.  Pt does not exhibit  signs of depression. PHQ2/9 score 0/0. Pt shows good  coping skills with positive outlook .  Will continue to monitor and evaluate progress toward psychosocial goal(s) of continued postive outlook and PHQ score of 0/0. Physical assessment reveals heart rate is normal, breath sounds clear to auscultation, no wheezes, rales, or rhonchi. Grip strength equal, strong. Distal pulses 2+ bilateral posterior tibial pulses present without peripheral edema. Patient reports hedoes take medications as prescribed. Patient states he follows a Regular diet. The patient reports no specific efforts to gain or lose weight.. Patient's weight will be monitored closely. Demonstration and practice of PLB using pulse oximeter. Patient able to return demonstration satisfactorily. Safety and hand hygiene in the exercise area reviewed with patient. Patient voices understanding of the information reviewed.  Department expectations discussed with patient and achievable goals were set. The patient shows enthusiasm about attending the program and we look forward to working with this nice gentleman. The patient is scheduled for a 6 min walk test on Tuesday, October 24, 2017 @ 3 pm and to begin exercise on Tuesday, October 31, 2017 in the 1030 class.  [id number] was spent with orientation to the pulmonary rehab program and documentation

## 2017-10-24 ENCOUNTER — Encounter (HOSPITAL_COMMUNITY)
Admission: RE | Admit: 2017-10-24 | Discharge: 2017-10-24 | Disposition: A | Discharge status: Home / Self Care | Payer: No Typology Code available for payment source | Source: Ambulatory Visit | Attending: Pulmonary Disease | Admitting: Pulmonary Disease

## 2017-10-24 DIAGNOSIS — Z87891 Personal history of nicotine dependence: Secondary | ICD-10-CM | POA: Diagnosis not present

## 2017-10-24 DIAGNOSIS — F4310 Post-traumatic stress disorder, unspecified: Secondary | ICD-10-CM | POA: Diagnosis not present

## 2017-10-24 DIAGNOSIS — I10 Essential (primary) hypertension: Secondary | ICD-10-CM | POA: Diagnosis not present

## 2017-10-24 DIAGNOSIS — J449 Chronic obstructive pulmonary disease, unspecified: Secondary | ICD-10-CM | POA: Diagnosis not present

## 2017-10-24 DIAGNOSIS — E785 Hyperlipidemia, unspecified: Secondary | ICD-10-CM | POA: Diagnosis not present

## 2017-10-24 DIAGNOSIS — G4730 Sleep apnea, unspecified: Secondary | ICD-10-CM | POA: Diagnosis not present

## 2017-10-24 NOTE — Progress Notes (Signed)
Pulmonary Individual Treatment Plan  Patient Details  Name: Gerald Williams MRN: [id number] Date of Birth: 08/04/1950 Referring Provider:     Pulmonary Rehab Walk Test from 10/24/2017 in [facility]  Referring Provider  Dr. Molli Knock      Initial Encounter Date:    Pulmonary Rehab Walk Test from 10/24/2017 in [facility]  Date  10/24/17      Visit Diagnosis: Chronic obstructive pulmonary disease, unspecified COPD type (HCC)  Patient's Home Medications on Admission:   Current Outpatient Medications:  .  acetaminophen (TYLENOL) 325 MG tablet, Take 650 mg by mouth every 6 (six) hours as needed., Disp: , Rfl:  .  albuterol (PROVENTIL HFA;VENTOLIN HFA) 108 (90 BASE) MCG/ACT inhaler, Inhale 2 puffs into the lungs every 6 (six) hours as needed for shortness of breath., Disp: , Rfl:  .  albuterol (PROVENTIL) (2.5 MG/3ML) 0.083% nebulizer solution, Take 2.5 mg by nebulization 2 (two) times daily. , Disp: , Rfl:  .  albuterol-ipratropium (COMBIVENT) 18-103 MCG/ACT inhaler, Inhale 1 puff into the lungs every 6 (six) hours as needed for wheezing., Disp: 1 Inhaler, Rfl: 5 .  aspirin 81 MG tablet, Take 81 mg by mouth daily., Disp: , Rfl:  .  budesonide-formoterol (SYMBICORT) 160-4.5 MCG/ACT inhaler, Inhale 2 puffs into the lungs 2 (two) times daily., Disp: 1 Inhaler, Rfl: 5 .  finasteride (PROSCAR) 5 MG tablet, Take 5 mg by mouth daily., Disp: , Rfl:  .  glucose blood (COOL BLOOD GLUCOSE TEST STRIPS) test strip, Used to test blood sugar x2 daily---diagnosis code r73.03--for one touch verio flex, Disp: 200 each, Rfl: 3 .  ipratropium (ATROVENT) 0.02 % nebulizer solution, Take 0.5 mg by nebulization 3 (three) times daily., Disp: , Rfl:  .  Lancets MISC, Used to test blood sugar x2 daily---diagnosis code r73.03--for one touch verio flex, Disp: 200 each, Rfl: 3 .  lisinopril (PRINIVIL,ZESTRIL) 20 MG tablet, Take 40 mg by mouth daily. , Disp: , Rfl:   .  mirtazapine (REMERON) 15 MG tablet, Take 15 mg by mouth 2 (two) times daily. , Disp: , Rfl:  .  NON FORMULARY, at bedtime. CPAP - followed at [place], Disp: , Rfl:  .  nortriptyline (PAMELOR) 10 MG capsule, Take 1 capsule (10 mg total) by mouth at bedtime., Disp: 30 capsule, Rfl: 5 .  Polyethyl Glycol-Propyl Glycol 0.4-0.3 % SOLN, Apply 2 drops to eye as needed., Disp: 15 mL, Rfl: 0 .  pregabalin (LYRICA) 150 MG capsule, Take 150 mg by mouth 2 (two) times daily., Disp: , Rfl:  .  sildenafil (VIAGRA) 100 MG tablet, Take 100 mg by mouth daily as needed for erectile dysfunction., Disp: , Rfl:  .  terazosin (HYTRIN) 2 MG capsule, Take 2 mg by mouth once., Disp: , Rfl:  .  valsartan (DIOVAN) 80 MG tablet, Take 1 tablet (80 mg total) by mouth daily., Disp: 30 tablet, Rfl: 3 .  vardenafil (LEVITRA) 20 MG tablet, Take 20 mg by mouth daily as needed for erectile dysfunction., Disp: , Rfl:   Past Medical History: Past Medical History:  Diagnosis Date  . Emphysema   . Erectile dysfunction   . Hyperlipemia   . Hypertension   . Polysubstance abuse (HCC)    stopped using crack/cocaine December 4th 20-14  . PTSD (post-traumatic stress disorder)   . Sleep apnea     Tobacco Use: Social History   Tobacco Use  Smoking Status Former Smoker  . Packs/day: 1.50  .  Years: 50.00  . Pack years: 75.00  . Types: Cigarettes  . Start date: 04/26/1963  . Last attempt to quit: 09/22/2013  . Years since quitting: 4.0  Smokeless Tobacco Never Used  Tobacco Comment   patient using the patch, losenges, and medication    Labs: Recent Review Flowsheet Data    Labs for ITP Cardiac and Pulmonary Rehab Latest Ref Rng & Units 04/02/2011 03/13/2015 05/26/2015 10/05/2015 01/01/2016   Cholestrol 0 - 200 mg/dL - - 161(W) - -   LDLCALC 0 - 99 mg/dL - - 960(A) - -   HDL >54.09 mg/dL - - 81.19 - -   Trlycerides 0.0 - 149.0 mg/dL - - 14.7 - -   Hemoglobin A1c 4.6 - 6.5 % - - 6.2 6.3 6.2   PHART 7.350 - 7.450 - - - - -    PCO2ART 35.0 - 45.0 mmHg - - - - -   HCO3 20.0 - 24.0 mEq/L - - - - -   TCO2 0 - 100 mmol/L 26 26 - - -   ACIDBASEDEF 0.0 - 2.0 mmol/L - - - - -   O2SAT % - - - - -      Capillary Blood Glucose: Lab Results  Component Value Date   GLUCAP 95 07/16/2014   GLUCAP 87 07/16/2014   GLUCAP 89 07/15/2014   GLUCAP 91 07/15/2014   GLUCAP 115 (H) 07/14/2014     Pulmonary Assessment Scores: Pulmonary Assessment Scores    Row Name 10/23/17 0951 10/24/17 1554       ADL UCSD   ADL Phase  Entry  Entry    SOB Score total  80  -      CAT Score   CAT Score  29  -      mMRC Score   mMRC Score  -  3       Pulmonary Function Assessment:   Exercise Target Goals: Date: 10/24/17  Exercise Program Goal: Individual exercise prescription set using results from initial 6 min walk test and THRR while considering  patient's activity barriers and safety.    Exercise Prescription Goal: Initial exercise prescription builds to 30-45 minutes a day of aerobic activity, 2-3 days per week.  Home exercise guidelines will be given to patient during program as part of exercise prescription that the participant will acknowledge.  Activity Barriers & Risk Stratification: Activity Barriers & Cardiac Risk Stratification - 10/23/17 0955      Activity Barriers & Cardiac Risk Stratification   Activity Barriers  Deconditioning       6 Minute Walk: 6 Minute Walk    Row Name 10/24/17 1551         6 Minute Walk   Phase  Initial     Distance  1200 feet     Walk Time  6 minutes     # of Rest Breaks  0     MPH  2.27     METS  2.68     RPE  14     Perceived Dyspnea   2     Symptoms  Yes (comment)     Comments  7/10 leg pain (constant pain)     Resting HR  85 bpm     Resting BP  130/80     Resting Oxygen Saturation   93 %     Exercise Oxygen Saturation  during 6 min walk  85 %     Max Ex. HR  111 bpm  Max Ex. BP  144/86     2 Minute Post BP  128/86       Interval HR   1 Minute HR  100      2 Minute HR  106     3 Minute HR  107     4 Minute HR  112     5 Minute HR  107     6 Minute HR  111     2 Minute Post HR  97     Interval Heart Rate?  Yes       Interval Oxygen   Interval Oxygen?  Yes     Baseline Oxygen Saturation %  93 %     1 Minute Oxygen Saturation %  93 %     1 Minute Liters of Oxygen  0 L     2 Minute Oxygen Saturation %  90 %     2 Minute Liters of Oxygen  0 L     3 Minute Oxygen Saturation %  87 %     3 Minute Liters of Oxygen  0 L     4 Minute Oxygen Saturation %  86 %     4 Minute Liters of Oxygen  0 L     5 Minute Oxygen Saturation %  85 %     5 Minute Liters of Oxygen  3 L     6 Minute Oxygen Saturation %  89 %     6 Minute Liters of Oxygen  3 L     2 Minute Post Oxygen Saturation %  95 %     2 Minute Post Liters of Oxygen  3 L        Oxygen Initial Assessment: Oxygen Initial Assessment - 10/24/17 1550      Home Oxygen   Home Oxygen Device  None    Sleep Oxygen Prescription  CPAP;None    Home Exercise Oxygen Prescription  Continuous    Liters per minute  3    Home at Rest Exercise Oxygen Prescription  Continuous    Liters per minute  3    Compliance with Home Oxygen Use  Yes      Initial 6 min Walk   Oxygen Used  Continuous;E-Tanks    Liters per minute  3 desat to 85% on RA      Program Oxygen Prescription   Program Oxygen Prescription  Continuous;E-Tanks    Liters per minute  3      Intervention   Short Term Goals  To learn and demonstrate proper pursed lip breathing techniques or other breathing techniques.;To learn and demonstrate proper use of respiratory medications;To learn and understand importance of maintaining oxygen saturations>88%;To learn and understand importance of monitoring SPO2 with pulse oximeter and demonstrate accurate use of the pulse oximeter.;To learn and exhibit compliance with exercise, home and travel O2 prescription    Long  Term Goals  Exhibits compliance with exercise, home and travel O2  prescription;Verbalizes importance of monitoring SPO2 with pulse oximeter and return demonstration;Maintenance of O2 saturations>88%;Exhibits proper breathing techniques, such as pursed lip breathing or other method taught during program session;Compliance with respiratory medication;Demonstrates proper use of MDI's       Oxygen Re-Evaluation:   Oxygen Discharge (Final Oxygen Re-Evaluation):   Initial Exercise Prescription: Initial Exercise Prescription - 10/24/17 1500      Date of Initial Exercise RX and Referring Provider   Date  10/24/17    Referring Provider  Dr. Molli Knock  Oxygen   Oxygen  Continuous    Liters  3      Bike   Level  0.6    Minutes  17      NuStep   Level  2    SPM  80    Minutes  17    METs  1.5      Track   Laps  10    Minutes  17      Prescription Details   Frequency (times per week)  2    Duration  Progress to 45 minutes of aerobic exercise without signs/symptoms of physical distress      Intensity   THRR 40-80% of Max Heartrate  62-124    Ratings of Perceived Exertion  11-13    Perceived Dyspnea  0-4      Progression   Progression  Continue progressive overload as per policy without signs/symptoms or physical distress.      Resistance Training   Training Prescription  Yes    Weight  blue bands    Reps  10-15       Perform Capillary Blood Glucose checks as needed.  Exercise Prescription Changes:   Exercise Comments:   Exercise Goals and Review:   Exercise Goals Re-Evaluation :   Discharge Exercise Prescription (Final Exercise Prescription Changes):   Nutrition:  Target Goals: Understanding of nutrition guidelines, daily intake of sodium 1500mg , cholesterol 200mg , calories 30% from fat and 7% or less from saturated fats, daily to have 5 or more servings of fruits and vegetables.  Biometrics: Pre Biometrics - 10/23/17 0955      Pre Biometrics   Grip Strength  40 kg        Nutrition Therapy Plan and Nutrition  Goals:   Nutrition Assessments: Nutrition Assessments - 10/23/17 1328      Rate Your Plate Scores   Pre Score  36       Nutrition Goals Re-Evaluation:   Nutrition Goals Discharge (Final Nutrition Goals Re-Evaluation):   Psychosocial: Target Goals: Acknowledge presence or absence of significant depression and/or stress, maximize coping skills, provide positive support system. Participant is able to verbalize types and ability to use techniques and skills needed for reducing stress and depression.  Initial Review & Psychosocial Screening: Initial Psych Review & Screening - 10/23/17 0959      Initial Review   Current issues with  None Identified      Family Dynamics   Good Support System?  Yes      Barriers   Psychosocial barriers to participate in program  There are no identifiable barriers or psychosocial needs.      Screening Interventions   Interventions  Encouraged to exercise       Quality of Life Scores:  Scores of 19 and below usually indicate a poorer quality of life in these areas.  A difference of  2-3 points is a clinically meaningful difference.  A difference of 2-3 points in the total score of the Quality of Life Index has been associated with significant improvement in overall quality of life, self-image, physical symptoms, and general health in studies assessing change in quality of life.   PHQ-9: Recent Review Flowsheet Data    Depression screen [facility] 2/9 10/23/2017 07/13/2017 05/23/2017 04/10/2014 12/23/2013   Decreased Interest 0 0 0 0 0   Down, Depressed, Hopeless 0 1 0 0 0   PHQ - 2 Score 0 1 0 0 0   Altered sleeping - 1 - - -  Tired, decreased energy - 0 - - -   Change in appetite - 0 - - -   Feeling bad or failure about yourself  - 0 - - -   Trouble concentrating - 0 - - -   Moving slowly or fidgety/restless - 0 - - -   Suicidal thoughts - 0 - - -   PHQ-9 Score - 2 - - -   Difficult doing work/chores - Not difficult at all - - -      Interpretation of Total Score  Total Score Depression Severity:  1-4 = Minimal depression, 5-9 = Mild depression, 10-14 = Moderate depression, 15-19 = Moderately severe depression, 20-27 = Severe depression   Psychosocial Evaluation and Intervention: Psychosocial Evaluation - 10/23/17 1002      Psychosocial Evaluation & Interventions   Interventions  Encouraged to exercise with the program and follow exercise prescription    Continue Psychosocial Services   No Follow up required       Psychosocial Re-Evaluation:   Psychosocial Discharge (Final Psychosocial Re-Evaluation):   Education: Education Goals: Education classes will be provided on a weekly basis, covering required topics. Participant will state understanding/return demonstration of topics presented.  Learning Barriers/Preferences: Learning Barriers/Preferences - 10/23/17 0943      Learning Barriers/Preferences   Learning Barriers  None    Learning Preferences  Written Material       Education Topics: Risk Factor Reduction:  -Group instruction that is supported by a PowerPoint presentation. Instructor discusses the definition of a risk factor, different risk factors for pulmonary disease, and how the heart and lungs work together.     Nutrition for Pulmonary Patient:  -Group instruction provided by PowerPoint slides, verbal discussion, and written materials to support subject matter. The instructor gives an explanation and review of healthy diet recommendations, which includes a discussion on weight management, recommendations for fruit and vegetable consumption, as well as protein, fluid, caffeine, fiber, sodium, sugar, and alcohol. Tips for eating when patients are short of breath are discussed.   Pursed Lip Breathing:  -Group instruction that is supported by demonstration and informational handouts. Instructor discusses the benefits of pursed lip and diaphragmatic breathing and detailed demonstration on how to  preform both.     Oxygen Safety:  -Group instruction provided by PowerPoint, verbal discussion, and written material to support subject matter. There is an overview of "What is Oxygen" and "Why do we need it".  Instructor also reviews how to create a safe environment for oxygen use, the importance of using oxygen as prescribed, and the risks of noncompliance. There is a brief discussion on traveling with oxygen and resources the patient may utilize.   Oxygen Equipment:  -Group instruction provided by [facility] Staff utilizing handouts, written materials, and equipment demonstrations.   Signs and Symptoms:  -Group instruction provided by written material and verbal discussion to support subject matter. Warning signs and symptoms of infection, stroke, and heart attack are reviewed and when to call the physician/911 reinforced. Tips for preventing the spread of infection discussed.   Advanced Directives:  -Group instruction provided by verbal instruction and written material to support subject matter. Instructor reviews Advanced Directive laws and proper instruction for filling out document.   Pulmonary Video:  -Group video education that reviews the importance of medication and oxygen compliance, exercise, good nutrition, pulmonary hygiene, and pursed lip and diaphragmatic breathing for the pulmonary patient.   Exercise for the Pulmonary Patient:  -Group instruction that is supported by a PowerPoint  presentation. Instructor discusses benefits of exercise, core components of exercise, frequency, duration, and intensity of an exercise routine, importance of utilizing pulse oximetry during exercise, safety while exercising, and options of places to exercise outside of rehab.     Pulmonary Medications:  -Verbally interactive group education provided by instructor with focus on inhaled medications and proper administration.   Anatomy and Physiology of the Respiratory System and Intimacy:   -Group instruction provided by PowerPoint, verbal discussion, and written material to support subject matter. Instructor reviews respiratory cycle and anatomical components of the respiratory system and their functions. Instructor also reviews differences in obstructive and restrictive respiratory diseases with examples of each. Intimacy, Sex, and Sexuality differences are reviewed with a discussion on how relationships can change when diagnosed with pulmonary disease. Common sexual concerns are reviewed.   MD DAY -A group question and answer session with a medical doctor that allows participants to ask questions that relate to their pulmonary disease state.   OTHER EDUCATION -Group or individual verbal, written, or video instructions that support the educational goals of the pulmonary rehab program.   Holiday Eating Survival Tips:  -Group instruction provided by PowerPoint slides, verbal discussion, and written materials to support subject matter. The instructor gives patients tips, tricks, and techniques to help them not only survive but enjoy the holidays despite the onslaught of food that accompanies the holidays.   Knowledge Questionnaire Score: Knowledge Questionnaire Score - 10/23/17 0943      Knowledge Questionnaire Score   Pre Score  15/18       Core Components/Risk Factors/Patient Goals at Admission: Personal Goals and Risk Factors at Admission - 10/23/17 0958      Core Components/Risk Factors/Patient Goals on Admission   Improve shortness of breath with ADL's  Yes    Intervention  Provide education, individualized exercise plan and daily activity instruction to help decrease symptoms of SOB with activities of daily living.    Expected Outcomes  Short Term: Improve cardiorespiratory fitness to achieve a reduction of symptoms when performing ADLs;Long Term: Be able to perform more ADLs without symptoms or delay the onset of symptoms       Core Components/Risk  Factors/Patient Goals Review:  Goals and Risk Factor Review    Row Name 10/23/17 0959             Core Components/Risk Factors/Patient Goals Review   Personal Goals Review  Improve shortness of breath with ADL's;Increase knowledge of respiratory medications and ability to use respiratory devices properly.;Develop more efficient breathing techniques such as purse lipped breathing and diaphragmatic breathing and practicing self-pacing with activity.          Core Components/Risk Factors/Patient Goals at Discharge (Final Review):  Goals and Risk Factor Review - 10/23/17 0959      Core Components/Risk Factors/Patient Goals Review   Personal Goals Review  Improve shortness of breath with ADL's;Increase knowledge of respiratory medications and ability to use respiratory devices properly.;Develop more efficient breathing techniques such as purse lipped breathing and diaphragmatic breathing and practicing self-pacing with activity.       ITP Comments:   Comments:

## 2017-10-31 ENCOUNTER — Encounter (HOSPITAL_COMMUNITY)
Admission: RE | Admit: 2017-10-31 | Discharge: 2017-10-31 | Disposition: A | Discharge status: Home / Self Care | Payer: No Typology Code available for payment source | Source: Ambulatory Visit | Attending: Pulmonary Disease | Admitting: Pulmonary Disease

## 2017-10-31 VITALS — Wt 169.5 lb

## 2017-10-31 DIAGNOSIS — J449 Chronic obstructive pulmonary disease, unspecified: Secondary | ICD-10-CM

## 2017-10-31 NOTE — Progress Notes (Signed)
Daily Session Note  Patient Details  Name: Lorence Nagengast MRN: [id number] Date of Birth: 09/10/1950 Referring Provider:     Pulmonary Rehab Walk Test from 10/24/2017 in [place]  Referring Provider  Dr. Nelda Marseille      Encounter Date: 10/31/2017  Check In: Session Check In - 10/31/17 1030      Check-In   Location  [facility]    Staff Present  Rosebud Poles, RN, BSN;Carlette Wilber Oliphant, RN, BSN;Lisa Ysidro Evert, Felipe Drone, RN, [facility]    Supervising physician immediately available to respond to emergencies  [facility] immediately available    Physician(s)  Dr. Verlon Au    Medication changes reported      No    Fall or balance concerns reported     No    Tobacco Cessation  No Change    Warm-up and Cool-down  Performed as group-led instruction    Resistance Training Performed  Yes    VAD Patient?  No    PAD/SET Patient?  No      Pain Assessment   Currently in Pain?  No/denies    Multiple Pain Sites  No       Capillary Blood Glucose: No results found for this or any previous visit (from the past 24 hour(s)).  Exercise Prescription Changes - 10/31/17 1200      Response to Exercise   Blood Pressure (Admit)  122/86    Blood Pressure (Exercise)  126/82    Blood Pressure (Exit)  130/68    Heart Rate (Admit)  73 bpm    Heart Rate (Exercise)  80 bpm    Heart Rate (Exit)  77 bpm    Oxygen Saturation (Admit)  96 %    Oxygen Saturation (Exercise)  96 %    Oxygen Saturation (Exit)  96 %    Rating of Perceived Exertion (Exercise)  10    Perceived Dyspnea (Exercise)  2    Duration  Progress to 45 minutes of aerobic exercise without signs/symptoms of physical distress    Intensity  THRR unchanged      Progression   Progression  Continue to progress workloads to maintain intensity without signs/symptoms of physical distress.      Resistance Training   Training Prescription  Yes    Weight  blue bands    Reps  10-15    Time  17  Minutes      Interval Training   Interval Training  No      Oxygen   Oxygen  Continuous    Liters  3      Bike   Level  0.6    Minutes  17      NuStep   Level  2    SPM  80    Minutes  17    METs  1.5      Track   Laps  7    Minutes  17       Social History   Tobacco Use  Smoking Status Former Smoker  . Packs/day: 1.50  . Years: 50.00  . Pack years: 75.00  . Types: Cigarettes  . Start date: 04/26/1963  . Last attempt to quit: 09/22/2013  . Years since quitting: 4.1  Smokeless Tobacco Never Used  Tobacco Comment   patient using the patch, losenges, and medication    Goals Met:  Exercise tolerated well Strength training completed today  Goals Unmet:  Not Applicable  Comments: Service time is  from 1030 to 1210    Dr. Rush Farmer is Medical Director for Pulmonary Rehab at [facility].

## 2017-11-02 ENCOUNTER — Encounter (HOSPITAL_COMMUNITY)
Admission: RE | Admit: 2017-11-02 | Discharge: 2017-11-02 | Disposition: A | Discharge status: Home / Self Care | Payer: No Typology Code available for payment source | Source: Ambulatory Visit | Attending: Pulmonary Disease | Admitting: Pulmonary Disease

## 2017-11-02 VITALS — Wt 167.8 lb

## 2017-11-02 DIAGNOSIS — J449 Chronic obstructive pulmonary disease, unspecified: Secondary | ICD-10-CM | POA: Diagnosis not present

## 2017-11-02 NOTE — Progress Notes (Signed)
Daily Session Note  Patient Details  Name: Sidi Dzikowski MRN: [id number] Date of Birth: 1950-07-16 Referring Provider:     Pulmonary Rehab Walk Test from 10/24/2017 in [place]  Referring Provider  Dr. Nelda Marseille      Encounter Date: 11/02/2017  Check In: Session Check In - 11/02/17 1030      Check-In   Location  [facility]    Staff Present  Rosebud Poles, RN, BSN;Carlette Wilber Oliphant, RN, BSN;Lisa Ysidro Evert, Felipe Drone, RN, [facility]    Supervising physician immediately available to respond to emergencies  [facility] immediately available    Physician(s)  Dr. [name]    Medication changes reported      No    Fall or balance concerns reported     No    Tobacco Cessation  No Change    Warm-up and Cool-down  Performed as group-led instruction    Resistance Training Performed  Yes    VAD Patient?  No    PAD/SET Patient?  No      Pain Assessment   Currently in Pain?  No/denies    Multiple Pain Sites  No       Capillary Blood Glucose: No results found for this or any previous visit (from the past 24 hour(s)).    Social History   Tobacco Use  Smoking Status Former Smoker  . Packs/day: 1.50  . Years: 50.00  . Pack years: 75.00  . Types: Cigarettes  . Start date: 04/26/1963  . Last attempt to quit: 09/22/2013  . Years since quitting: 4.1  Smokeless Tobacco Never Used  Tobacco Comment   patient using the patch, losenges, and medication    Goals Met:  Exercise tolerated well Strength training completed today  Goals Unmet:  Not Applicable  Comments: Service time is from 1030 to 1225   Dr. Rush Farmer is Medical Director for Pulmonary Rehab at [facility].

## 2017-11-07 ENCOUNTER — Ambulatory Visit (INDEPENDENT_AMBULATORY_CARE_PROVIDER_SITE_OTHER): Payer: Medicare Other | Admitting: Pulmonary Disease

## 2017-11-07 ENCOUNTER — Encounter: Payer: Self-pay | Admitting: Pulmonary Disease

## 2017-11-07 ENCOUNTER — Encounter (HOSPITAL_COMMUNITY): Payer: No Typology Code available for payment source

## 2017-11-07 VITALS — BP 138/72 | HR 67 | Temp 97.7°F | Ht 65.0 in | Wt 167.2 lb

## 2017-11-07 DIAGNOSIS — R0902 Hypoxemia: Secondary | ICD-10-CM

## 2017-11-07 DIAGNOSIS — J432 Centrilobular emphysema: Secondary | ICD-10-CM

## 2017-11-07 DIAGNOSIS — F4310 Post-traumatic stress disorder, unspecified: Secondary | ICD-10-CM

## 2017-11-07 DIAGNOSIS — Z9989 Dependence on other enabling machines and devices: Secondary | ICD-10-CM

## 2017-11-07 DIAGNOSIS — G4733 Obstructive sleep apnea (adult) (pediatric): Secondary | ICD-10-CM

## 2017-11-07 NOTE — Patient Instructions (Addendum)
Today we updated your med list in our EPIC system...    Continue your current medications the same...  [facility]:    NEBULIZER w/ Albuterol & Ipratropium 3 times daily...    SYMBICORT 160- 2 sprays twice daily    COMBIVENT rescue inhaler as needed...  [facility] also [facility]AP at night- continue their recommendations...  [facility]...    Continue to participate in this valuable treatment option!!!  Today we did an ambulatory oximetry test>      Your oxygen level dropped to 87% saturation after 2 laps in the office (pulse rate= 88/min)    We then applied oxygen at 2L/min flow via a pulse generator & you maintained your oxygen level in the 90's...  We will order a small light weight portable oxygen concentrator w/ instructions to use theis w/ activity- at 2L/min flow via pulse generator while you are up & about...  Call for any questions or if we can be of service in any way.Marland KitchenMarland Kitchen

## 2017-11-08 ENCOUNTER — Telehealth: Payer: Self-pay | Admitting: *Deleted

## 2017-11-08 ENCOUNTER — Telehealth: Payer: Self-pay | Admitting: Pulmonary Disease

## 2017-11-08 NOTE — Progress Notes (Signed)
Gerald Williams 67 y.o. male  DOB: Jan 14, 1951 MRN: [id number]           Nutrition Note 1. Chronic obstructive pulmonary disease, unspecified COPD type (HCC)    Past Medical History:  Diagnosis Date  . Emphysema   . Erectile dysfunction   . Hyperlipemia   . Hypertension   . Polysubstance abuse (HCC)    stopped using crack/cocaine December 4th 20-14  . PTSD (post-traumatic stress disorder)   . Sleep apnea    Meds reviewed. Remeron, lisinopril noted  Ht: Ht Readings from Last 1 Encounters:  11/07/17 5\' 5"  (1.651 m)     Wt:  Wt Readings from Last 3 Encounters:  11/07/17 167 lb 3.2 oz (75.8 kg)  11/02/17 167 lb 12.3 oz (76.1 kg)  10/31/17 169 lb 8.5 oz (76.9 kg)     BMI: Body mass index is 27.08 kg/m.    Current tobacco use? No     Labs:  Lipid Panel     Component Value Date/Time   CHOL 210 (H) 05/26/2015 0914   TRIG 62.0 05/26/2015 0914   HDL 60.90 05/26/2015 0914   CHOLHDL 3 05/26/2015 0914   VLDL 12.4 05/26/2015 0914   LDLCALC 136 (H) 05/26/2015 0914    Lab Results  Component Value Date   HGBA1C 6.2 01/01/2016    Nutrition Diagnosis ? Excessive sodium intake related to over consumption of processed food as evidenced by frequent consumption of convenience food/ canned vegetables and eating out frequently. ? Overweight/obesity related to excessive energy intake as evidenced by a BMI of Body mass index is 27.08 kg/m.  Goal(s) 1. Pt to identify and limit food sources of sodium. 2. Identify food quantities necessary to achieve wt loss of  -2# per week to a goal wt loss of 2.7-10.9 kg (6-24 lb) at graduation from pulmonary rehab.    Plan:  Pt to attend Pulmonary Nutrition class Will provide client-centered nutrition education as part of interdisciplinary care.   Monitor and evaluate progress toward nutrition goal with team.  Monitor and Evaluate progress toward nutrition goal with team.   Ross Marcus, MS, RD, LDN 11/08/2017 6:02 PM

## 2017-11-08 NOTE — Telephone Encounter (Signed)
Dis reguard error.Gerald Williams

## 2017-11-08 NOTE — Telephone Encounter (Signed)
Wrong dr.Stanley A Dalton ° °

## 2017-11-09 ENCOUNTER — Telehealth: Payer: Self-pay | Admitting: Pulmonary Disease

## 2017-11-09 ENCOUNTER — Encounter (HOSPITAL_COMMUNITY)
Admission: RE | Admit: 2017-11-09 | Discharge: 2017-11-09 | Disposition: A | Discharge status: Home / Self Care | Payer: No Typology Code available for payment source | Source: Ambulatory Visit | Attending: Pulmonary Disease | Admitting: Pulmonary Disease

## 2017-11-09 VITALS — Wt 167.8 lb

## 2017-11-09 DIAGNOSIS — J449 Chronic obstructive pulmonary disease, unspecified: Secondary | ICD-10-CM

## 2017-11-09 NOTE — Progress Notes (Signed)
Daily Session Note  Patient Details  Name: Gerald Williams MRN: [id number] Date of Birth: October 31, 1950 Referring Provider:     Pulmonary Rehab Walk Test from 10/24/2017 in [place]  Referring Provider  Dr. Nelda Marseille      Encounter Date: 11/09/2017  Check In: Session Check In - 11/09/17 1030      Check-In   Location  [facility]    Staff Present  Rosebud Poles, RN, BSN;Carlette Carlton, RN, [facility], MS, ACSM RCEP, Exercise Physiologist;Lisa Ysidro Evert, Felipe Drone, RN, [facility]    Supervising physician immediately available to respond to emergencies  [facility] immediately available    Physician(s)  Dr. Denton Brick    Medication changes reported      No    Fall or balance concerns reported     No    Tobacco Cessation  No Change    Warm-up and Cool-down  Performed as group-led instruction    Resistance Training Performed  Yes    VAD Patient?  No    PAD/SET Patient?  No      Pain Assessment   Currently in Pain?  No/denies    Multiple Pain Sites  No       Capillary Blood Glucose: No results found for this or any previous visit (from the past 24 hour(s)).    Social History   Tobacco Use  Smoking Status Former Smoker  . Packs/day: 1.50  . Years: 50.00  . Pack years: 75.00  . Types: Cigarettes  . Start date: 04/26/1963  . Last attempt to quit: 09/22/2013  . Years since quitting: 4.1  Smokeless Tobacco Never Used  Tobacco Comment   patient using the patch, losenges, and medication    Goals Met:  Exercise tolerated well Strength training completed today  Goals Unmet:  Not Applicable  Comments: Service time is from 1030 to [place]   Dr. Rush Farmer is Medical Director for Pulmonary Rehab at [facility].

## 2017-11-09 NOTE — Telephone Encounter (Signed)
Spoke with Rod Holler at [facility]. States that they can't provide pt with a POC. They show in their system that he has a home concentrator through the [place].  Attempted to call pt. I did not receive an answer. I have left a message for pt to return our call.

## 2017-11-09 NOTE — Telephone Encounter (Signed)
Pt called yesterday & states order was to go to [facility] & not [facility].  Synetta Fail has sent order to them.

## 2017-11-10 ENCOUNTER — Telehealth: Payer: Self-pay | Admitting: Pulmonary Disease

## 2017-11-10 MED ORDER — BUDESONIDE-FORMOTEROL FUMARATE 160-4.5 MCG/ACT IN AERO: 2.0000 | INHALATION_SPRAY | Freq: Two times a day (BID) | RESPIRATORY_TRACT | 0 refills | Status: DC

## 2017-11-10 NOTE — Telephone Encounter (Signed)
Symbicort 160 samples given to Patient.  Patient brought paperwork from [place].  [facility] paperwork for POC, signed by SN and faxed to [facility]. Fax confirmation received.   Nothing further at this time.

## 2017-11-10 NOTE — Progress Notes (Signed)
Gerald Williams 67 y.o. male  DOB: 04-12-1951 MRN: [id number]           Nutrition Note 1. Chronic obstructive pulmonary disease, unspecified COPD type (HCC)    Past Medical History:  Diagnosis Date  . Emphysema   . Erectile dysfunction   . Hyperlipemia   . Hypertension   . Polysubstance abuse (HCC)    stopped using crack/cocaine December 4th 20-14  . PTSD (post-traumatic stress disorder)   . Sleep apnea    Meds reviewed. remeron lisinopril noted  Ht: Ht Readings from Last 1 Encounters:  11/07/17 5\' 5"  (1.651 m)     Wt:  Wt Readings from Last 3 Encounters:  11/09/17 167 lb 12.3 oz (76.1 kg)  11/07/17 167 lb 3.2 oz (75.8 kg)  11/02/17 167 lb 12.3 oz (76.1 kg)     BMI: Body mass index is 27.92 kg/m.    Current tobacco use? no  Labs:  Lipid Panel     Component Value Date/Time   CHOL 210 (H) 05/26/2015 0914   TRIG 62.0 05/26/2015 0914   HDL 60.90 05/26/2015 0914   CHOLHDL 3 05/26/2015 0914   VLDL 12.4 05/26/2015 0914   LDLCALC 136 (H) 05/26/2015 0914    Lab Results  Component Value Date   HGBA1C 6.2 01/01/2016    Note Spoke with pt. Pt is overweight. Shared he would like to lose 10 lbs, has started making changes to meal plan.  Pt eats 3 meals a day; most prepared at home.  Making healthy food choices the majority of the time, including fruits, vegetables, whole grains, and low-fat dairy products, and lean protein in meal plan. Pt's Rate Your Plate results reviewed with pt. Pt does not avoid salty food; uses canned/ convenience food. Pt does not add salt to food. The role of sodium in lung disease reviewed with pt. Educated pt on label reading, and distributed handout. Pt expressed understanding of the information reviewed. Pt is prediabetic, additionally reviewed the importance of including complex carbohydrates into his meal plan. Pt shared he has discussed this with previous providers and feels comfortable Pt expressed understanding of the information reviewed via  feedback method.    Nutrition Diagnosis ? Excessive sodium intake related to over consumption of processed food as evidenced by frequent consumption of convenience food/ canned vegetables and eating out frequently. ? Overweight/obesity related to excessive energy intake as evidenced by a BMI of Body mass index is 27.92 kg/m.  Goal(s)  1. Pt to identify and limit food sources of sodium. 2. Identify food quantities necessary to achieve wt loss of  -2# per week to a goal wt loss of 2.7-10.9 kg (6-24 lb) at graduation from pulmonary rehab.   Plan:  Pt to attend Pulmonary Nutrition class Will provide client-centered nutrition education as part of interdisciplinary care.   Monitor and Evaluate progress toward nutrition goal with team.   Ross Marcus, MS, RD, LDN 11/10/2017 8:16 AM

## 2017-11-10 NOTE — Telephone Encounter (Signed)
Called and spoke with patient, he states that [name] Allegra is needing the order for the POC as well as needing to know the liter setting. Advised patient that the order has already been sent. Nothing further needed.

## 2017-11-10 NOTE — Telephone Encounter (Signed)
Spoke with pt. He is aware that his order has been sent to [facility]. Nothing further was needed at this time.

## 2017-11-10 NOTE — Telephone Encounter (Signed)
Pt is calling for an update on the POC order. Pt Cb is [phone].

## 2017-11-13 DIAGNOSIS — R0902 Hypoxemia: Secondary | ICD-10-CM | POA: Diagnosis not present

## 2017-11-13 DIAGNOSIS — J432 Centrilobular emphysema: Secondary | ICD-10-CM | POA: Diagnosis not present

## 2017-11-13 NOTE — Progress Notes (Signed)
Pulmonary Individual Treatment Plan  Patient Details  Name: Gerald Williams MRN: [id number] Date of Birth: 1950/07/31 Referring Provider:     Pulmonary Rehab Walk Test from 10/24/2017 in [facility]  Referring Provider  Dr. Molli Knock      Initial Encounter Date:    Pulmonary Rehab Walk Test from 10/24/2017 in [facility] CARDI[facility] REHAB  Date  10/24/17      Visit Diagnosis: Chronic obstructive pulmonary disease, unspecified COPD type (HCC)  Patient's Home Medications on Admission:   Current Outpatient Medications:  .  acetaminophen (TYLENOL) 325 MG tablet, Take 650 mg by mouth every 6 (six) hours as needed., Disp: , Rfl:  .  albuterol (PROVENTIL HFA;VENTOLIN HFA) 108 (90 BASE) MCG/ACT inhaler, Inhale 2 puffs into the lungs every 6 (six) hours as needed for shortness of breath., Disp: , Rfl:  .  albuterol (PROVENTIL) (2.5 MG/3ML) 0.083% nebulizer solution, Take 2.5 mg by nebulization 2 (two) times daily. , Disp: , Rfl:  .  albuterol-ipratropium (COMBIVENT) 18-103 MCG/ACT inhaler, Inhale 1 puff into the lungs every 6 (six) hours as needed for wheezing., Disp: 1 Inhaler, Rfl: 5 .  aspirin 81 MG tablet, Take 81 mg by mouth daily., Disp: , Rfl:  .  budesonide-formoterol (SYMBICORT) 160-4.5 MCG/ACT inhaler, Inhale 2 puffs into the lungs 2 (two) times daily., Disp: 1 Inhaler, Rfl: 5 .  budesonide-formoterol (SYMBICORT) 160-4.5 MCG/ACT inhaler, Inhale 2 puffs into the lungs 2 (two) times daily., Disp: 2 Inhaler, Rfl: 0 .  finasteride (PROSCAR) 5 MG tablet, Take 5 mg by mouth daily., Disp: , Rfl:  .  glucose blood (COOL BLOOD GLUCOSE TEST STRIPS) test strip, Used to test blood sugar x2 daily---diagnosis code r73.03--for one touch verio flex, Disp: 200 each, Rfl: 3 .  ipratropium (ATROVENT) 0.02 % nebulizer solution, Take 0.5 mg by nebulization 3 (three) times daily., Disp: , Rfl:  .  Lancets MISC, Used to test blood sugar x2 daily---diagnosis code  r73.03--for one touch verio flex, Disp: 200 each, Rfl: 3 .  lisinopril (PRINIVIL,ZESTRIL) 20 MG tablet, Take 40 mg by mouth daily. , Disp: , Rfl:  .  mirtazapine (REMERON) 15 MG tablet, Take 15 mg by mouth 2 (two) times daily. , Disp: , Rfl:  .  NON FORMULARY, at bedtime. CPAP - followed at [place], Disp: , Rfl:  .  nortriptyline (PAMELOR) 10 MG capsule, Take 1 capsule (10 mg total) by mouth at bedtime., Disp: 30 capsule, Rfl: 5 .  Polyethyl Glycol-Propyl Glycol 0.4-0.3 % SOLN, Apply 2 drops to eye as needed., Disp: 15 mL, Rfl: 0 .  pregabalin (LYRICA) 150 MG capsule, Take 150 mg by mouth 2 (two) times daily., Disp: , Rfl:  .  terazosin (HYTRIN) 2 MG capsule, Take 2 mg by mouth once., Disp: , Rfl:  .  valsartan (DIOVAN) 80 MG tablet, Take 1 tablet (80 mg total) by mouth daily., Disp: 30 tablet, Rfl: 3 .  vardenafil (LEVITRA) 20 MG tablet, Take 20 mg by mouth daily as needed for erectile dysfunction., Disp: , Rfl:   Past Medical History: Past Medical History:  Diagnosis Date  . Emphysema   . Erectile dysfunction   . Hyperlipemia   . Hypertension   . Polysubstance abuse (HCC)    stopped using crack/cocaine December 4th 20-14  . PTSD (post-traumatic stress disorder)   . Sleep apnea     Tobacco Use: Social History   Tobacco Use  Smoking Status Former Smoker  . Packs/day: 1.50  .  Years: 50.00  . Pack years: 75.00  . Types: Cigarettes  . Start date: 04/26/1963  . Last attempt to quit: 09/22/2013  . Years since quitting: 4.1  Smokeless Tobacco Never Used  Tobacco Comment   patient using the patch, losenges, and medication    Labs: Recent Review Flowsheet Data    Labs for ITP Cardiac and Pulmonary Rehab Latest Ref Rng & Units 04/02/2011 03/13/2015 05/26/2015 10/05/2015 01/01/2016   Cholestrol 0 - 200 mg/dL - - 161(W) - -   LDLCALC 0 - 99 mg/dL - - 960(A) - -   HDL >54.09 mg/dL - - 81.19 - -   Trlycerides 0.0 - 149.0 mg/dL - - 14.7 - -   Hemoglobin A1c 4.6 - 6.5 % - - 6.2 6.3 6.2   PHART  7.350 - 7.450 - - - - -   PCO2ART 35.0 - 45.0 mmHg - - - - -   HCO3 20.0 - 24.0 mEq/L - - - - -   TCO2 0 - 100 mmol/L 26 26 - - -   ACIDBASEDEF 0.0 - 2.0 mmol/L - - - - -   O2SAT % - - - - -      Capillary Blood Glucose: Lab Results  Component Value Date   GLUCAP 95 07/16/2014   GLUCAP 87 07/16/2014   GLUCAP 89 07/15/2014   GLUCAP 91 07/15/2014   GLUCAP 115 (H) 07/14/2014     Pulmonary Assessment Scores: Pulmonary Assessment Scores    Row Name 10/23/17 0951 10/24/17 1554       ADL UCSD   ADL Phase  Entry  Entry    SOB Score total  80  -      CAT Score   CAT Score  29  -      mMRC Score   mMRC Score  -  3       Pulmonary Function Assessment:   Exercise Target Goals:    Exercise Program Goal: Individual exercise prescription set using results from initial 6 min walk test and THRR while considering  patient's activity barriers and safety.    Exercise Prescription Goal: Initial exercise prescription builds to 30-45 minutes a day of aerobic activity, 2-3 days per week.  Home exercise guidelines will be given to patient during program as part of exercise prescription that the participant will acknowledge.  Activity Barriers & Risk Stratification: Activity Barriers & Cardiac Risk Stratification - 10/23/17 0955      Activity Barriers & Cardiac Risk Stratification   Activity Barriers  Deconditioning       6 Minute Walk: 6 Minute Walk    Row Name 10/24/17 1551         6 Minute Walk   Phase  Initial     Distance  1200 feet     Walk Time  6 minutes     # of Rest Breaks  0     MPH  2.27     METS  2.68     RPE  14     Perceived Dyspnea   2     Symptoms  Yes (comment)     Comments  7/10 leg pain (constant pain)     Resting HR  85 bpm     Resting BP  130/80     Resting Oxygen Saturation   93 %     Exercise Oxygen Saturation  during 6 min walk  85 %     Max Ex. HR  111 bpm  Max Ex. BP  144/86     2 Minute Post BP  128/86       Interval HR   1  Minute HR  100     2 Minute HR  106     3 Minute HR  107     4 Minute HR  112     5 Minute HR  107     6 Minute HR  111     2 Minute Post HR  97     Interval Heart Rate?  Yes       Interval Oxygen   Interval Oxygen?  Yes     Baseline Oxygen Saturation %  93 %     1 Minute Oxygen Saturation %  93 %     1 Minute Liters of Oxygen  0 L     2 Minute Oxygen Saturation %  90 %     2 Minute Liters of Oxygen  0 L     3 Minute Oxygen Saturation %  87 %     3 Minute Liters of Oxygen  0 L     4 Minute Oxygen Saturation %  86 %     4 Minute Liters of Oxygen  0 L     5 Minute Oxygen Saturation %  85 %     5 Minute Liters of Oxygen  3 L     6 Minute Oxygen Saturation %  89 %     6 Minute Liters of Oxygen  3 L     2 Minute Post Oxygen Saturation %  95 %     2 Minute Post Liters of Oxygen  3 L        Oxygen Initial Assessment: Oxygen Initial Assessment - 10/24/17 1550      Home Oxygen   Home Oxygen Device  None    Sleep Oxygen Prescription  CPAP;None    Home Exercise Oxygen Prescription  Continuous    Liters per minute  3    Home at Rest Exercise Oxygen Prescription  Continuous    Liters per minute  3    Compliance with Home Oxygen Use  Yes      Initial 6 min Walk   Oxygen Used  Continuous;E-Tanks    Liters per minute  3 desat to 85% on RA      Program Oxygen Prescription   Program Oxygen Prescription  Continuous;E-Tanks    Liters per minute  3      Intervention   Short Term Goals  To learn and demonstrate proper pursed lip breathing techniques or other breathing techniques.;To learn and demonstrate proper use of respiratory medications;To learn and understand importance of maintaining oxygen saturations>88%;To learn and understand importance of monitoring SPO2 with pulse oximeter and demonstrate accurate use of the pulse oximeter.;To learn and exhibit compliance with exercise, home and travel O2 prescription    Long  Term Goals  Exhibits compliance with exercise, home and travel  O2 prescription;Verbalizes importance of monitoring SPO2 with pulse oximeter and return demonstration;Maintenance of O2 saturations>88%;Exhibits proper breathing techniques, such as pursed lip breathing or other method taught during program session;Compliance with respiratory medication;Demonstrates proper use of MDI's       Oxygen Re-Evaluation: Oxygen Re-Evaluation    Row Name 11/13/17 0901             Program Oxygen Prescription   Program Oxygen Prescription  Continuous;E-Tanks       Liters per minute  3  Home Oxygen   Home Oxygen Device  None       Sleep Oxygen Prescription  CPAP;None       Home Exercise Oxygen Prescription  Continuous       Liters per minute  3       Home at Rest Exercise Oxygen Prescription  None       Compliance with Home Oxygen Use  Yes         Goals/Expected Outcomes   Short Term Goals  To learn and demonstrate proper pursed lip breathing techniques or other breathing techniques.;To learn and demonstrate proper use of respiratory medications;To learn and understand importance of maintaining oxygen saturations>88%;To learn and understand importance of monitoring SPO2 with pulse oximeter and demonstrate accurate use of the pulse oximeter.;To learn and exhibit compliance with exercise, home and travel O2 prescription       Long  Term Goals  Exhibits compliance with exercise, home and travel O2 prescription;Verbalizes importance of monitoring SPO2 with pulse oximeter and return demonstration;Maintenance of O2 saturations>88%;Exhibits proper breathing techniques, such as pursed lip breathing or other method taught during program session;Compliance with respiratory medication;Demonstrates proper use of MDI's          Oxygen Discharge (Final Oxygen Re-Evaluation): Oxygen Re-Evaluation - 11/13/17 0901      Program Oxygen Prescription   Program Oxygen Prescription  Continuous;E-Tanks    Liters per minute  3      Home Oxygen   Home Oxygen Device  None     Sleep Oxygen Prescription  CPAP;None    Home Exercise Oxygen Prescription  Continuous    Liters per minute  3    Home at Rest Exercise Oxygen Prescription  None    Compliance with Home Oxygen Use  Yes      Goals/Expected Outcomes   Short Term Goals  To learn and demonstrate proper pursed lip breathing techniques or other breathing techniques.;To learn and demonstrate proper use of respiratory medications;To learn and understand importance of maintaining oxygen saturations>88%;To learn and understand importance of monitoring SPO2 with pulse oximeter and demonstrate accurate use of the pulse oximeter.;To learn and exhibit compliance with exercise, home and travel O2 prescription    Long  Term Goals  Exhibits compliance with exercise, home and travel O2 prescription;Verbalizes importance of monitoring SPO2 with pulse oximeter and return demonstration;Maintenance of O2 saturations>88%;Exhibits proper breathing techniques, such as pursed lip breathing or other method taught during program session;Compliance with respiratory medication;Demonstrates proper use of MDI's       Initial Exercise Prescription: Initial Exercise Prescription - 10/24/17 1500      Date of Initial Exercise RX and Referring Provider   Date  10/24/17    Referring Provider  Dr. Molli Knock      Oxygen   Oxygen  Continuous    Liters  3      Bike   Level  0.6    Minutes  17      NuStep   Level  2    SPM  80    Minutes  17    METs  1.5      Track   Laps  10    Minutes  17      Prescription Details   Frequency (times per week)  2    Duration  Progress to 45 minutes of aerobic exercise without signs/symptoms of physical distress      Intensity   THRR 40-80% of Max Heartrate  62-124    Ratings of Perceived Exertion  11-13    Perceived Dyspnea  0-4      Progression   Progression  Continue progressive overload as per policy without signs/symptoms or physical distress.      Resistance Training   Training  Prescription  Yes    Weight  blue bands    Reps  10-15       Perform Capillary Blood Glucose checks as needed.  Exercise Prescription Changes:  Exercise Prescription Changes    Row Name 10/31/17 1200 11/14/17 1200           Response to Exercise   Blood Pressure (Admit)  122/86  106/60      Blood Pressure (Exercise)  126/82  140/80      Blood Pressure (Exit)  130/68  140/68      Heart Rate (Admit)  73 bpm  71 bpm      Heart Rate (Exercise)  80 bpm  92 bpm      Heart Rate (Exit)  77 bpm  69 bpm      Oxygen Saturation (Admit)  96 %  97 %      Oxygen Saturation (Exercise)  96 %  94 %      Oxygen Saturation (Exit)  96 %  96 %      Rating of Perceived Exertion (Exercise)  10  11      Perceived Dyspnea (Exercise)  2  3      Duration  Progress to 45 minutes of aerobic exercise without signs/symptoms of physical distress  Progress to 45 minutes of aerobic exercise without signs/symptoms of physical distress      Intensity  THRR unchanged  THRR unchanged        Progression   Progression  Continue to progress workloads to maintain intensity without signs/symptoms of physical distress.  Continue to progress workloads to maintain intensity without signs/symptoms of physical distress.        Resistance Training   Training Prescription  Yes  Yes      Weight  blue bands  blue bands      Reps  10-15  10-15      Time  17 Minutes  17 Minutes        Interval Training   Interval Training  No  No        Oxygen   Oxygen  Continuous  Continuous      Liters  3  3        Bike   Level  0.6  0.6      Minutes  17  17        NuStep   Level  2  3      SPM  80  80      Minutes  17  17      METs  1.5  2.6        Track   Laps  7  7      Minutes  17  17         Exercise Comments:   Exercise Goals and Review:   Exercise Goals Re-Evaluation : Exercise Goals Re-Evaluation    Row Name 11/13/17 0902             Exercise Goal Re-Evaluation   Exercise Goals Review  Increase  Physical Activity;Able to understand and use rate of perceived exertion (RPE) scale;Knowledge and understanding of Target Heart Rate Range (THRR);Understanding of Exercise Prescription;Increase Strength and Stamina;Able to understand and use Dyspnea scale;Able to check  pulse independently       Comments  Patient has only attended three rehab sessions. Will cont to monitor and progress as able.        Expected Outcomes  Through exercise at rehab and at home, the patient will decrease shortness of breath with daily activities and feel confident in carrying out an exercise regime at home.           Discharge Exercise Prescription (Final Exercise Prescription Changes): Exercise Prescription Changes - 11/14/17 1200      Response to Exercise   Blood Pressure (Admit)  106/60    Blood Pressure (Exercise)  140/80    Blood Pressure (Exit)  140/68    Heart Rate (Admit)  71 bpm    Heart Rate (Exercise)  92 bpm    Heart Rate (Exit)  69 bpm    Oxygen Saturation (Admit)  97 %    Oxygen Saturation (Exercise)  94 %    Oxygen Saturation (Exit)  96 %    Rating of Perceived Exertion (Exercise)  11    Perceived Dyspnea (Exercise)  3    Duration  Progress to 45 minutes of aerobic exercise without signs/symptoms of physical distress    Intensity  THRR unchanged      Progression   Progression  Continue to progress workloads to maintain intensity without signs/symptoms of physical distress.      Resistance Training   Training Prescription  Yes    Weight  blue bands    Reps  10-15    Time  17 Minutes      Interval Training   Interval Training  No      Oxygen   Oxygen  Continuous    Liters  3      Bike   Level  0.6    Minutes  17      NuStep   Level  3    SPM  80    Minutes  17    METs  2.6      Track   Laps  7    Minutes  17       Nutrition:  Target Goals: Understanding of nutrition guidelines, daily intake of sodium 1500mg , cholesterol 200mg , calories 30% from fat and 7% or less  from saturated fats, daily to have 5 or more servings of fruits and vegetables.  Biometrics: Pre Biometrics - 10/23/17 0955      Pre Biometrics   Grip Strength  40 kg        Nutrition Therapy Plan and Nutrition Goals: Nutrition Therapy & Goals - 11/08/17 1800      Nutrition Therapy   Diet  carb consistent      Personal Nutrition Goals   Nutrition Goal  Identify food quantities necessary to achieve wt loss of  -2# per week to a goal wt loss of 2.7-10.9 kg (6-24 lb) at graduation from pulmonary rehab.    Personal Goal #2  Pt to identify and limit food sources of sodium.      Intervention Plan   Intervention  Prescribe, educate and counsel regarding individualized specific dietary modifications aiming towards targeted core components such as weight, hypertension, lipid management, diabetes, heart failure and other comorbidities.    Expected Outcomes  Short Term Goal: Understand basic principles of dietary content, such as calories, fat, sodium, cholesterol and nutrients.       Nutrition Assessments: Nutrition Assessments - 10/23/17 1328      Rate Your Plate Scores  Pre Score  36       Nutrition Goals Re-Evaluation:   Nutrition Goals Discharge (Final Nutrition Goals Re-Evaluation):   Psychosocial: Target Goals: Acknowledge presence or absence of significant depression and/or stress, maximize coping skills, provide positive support system. Participant is able to verbalize types and ability to use techniques and skills needed for reducing stress and depression.  Initial Review & Psychosocial Screening: Initial Psych Review & Screening - 10/23/17 0959      Initial Review   Current issues with  None Identified      Family Dynamics   Good Support System?  Yes      Barriers   Psychosocial barriers to participate in program  There are no identifiable barriers or psychosocial needs.      Screening Interventions   Interventions  Encouraged to exercise       Quality  of Life Scores:  Scores of 19 and below usually indicate a poorer quality of life in these areas.  A difference of  2-3 points is a clinically meaningful difference.  A difference of 2-3 points in the total score of the Quality of Life Index has been associated with significant improvement in overall quality of life, self-image, physical symptoms, and general health in studies assessing change in quality of life.   PHQ-9: Recent Review Flowsheet Data    Depression screen [facility] 2/9 10/23/2017 07/13/2017 05/23/2017 04/10/2014 12/23/2013   Decreased Interest 0 0 0 0 0   Down, Depressed, Hopeless 0 1 0 0 0   PHQ - 2 Score 0 1 0 0 0   Altered sleeping - 1 - - -   Tired, decreased energy - 0 - - -   Change in appetite - 0 - - -   Feeling bad or failure about yourself  - 0 - - -   Trouble concentrating - 0 - - -   Moving slowly or fidgety/restless - 0 - - -   Suicidal thoughts - 0 - - -   PHQ-9 Score - 2 - - -   Difficult doing work/chores - Not difficult at all - - -     Interpretation of Total Score  Total Score Depression Severity:  1-4 = Minimal depression, 5-9 = Mild depression, 10-14 = Moderate depression, 15-19 = Moderately severe depression, 20-27 = Severe depression   Psychosocial Evaluation and Intervention: Psychosocial Evaluation - 11/13/17 1547      Psychosocial Evaluation & Interventions   Interventions  Encouraged to exercise with the program and follow exercise prescription    Comments  no identifiable psychosocial barriers    Expected Outcomes  Pt will continue to display positive and healthy coping skills.  Pt will demonstrate a positive outlook on life and his future.    Continue Psychosocial Services   No Follow up required       Psychosocial Re-Evaluation:   Psychosocial Discharge (Final Psychosocial Re-Evaluation):   Education: Education Goals: Education classes will be provided on a weekly basis, covering required topics. Participant will state  understanding/return demonstration of topics presented.  Learning Barriers/Preferences: Learning Barriers/Preferences - 10/23/17 0943      Learning Barriers/Preferences   Learning Barriers  None    Learning Preferences  Written Material       Education Topics: Risk Factor Reduction:  -Group instruction that is supported by a PowerPoint presentation. Instructor discusses the definition of a risk factor, different risk factors for pulmonary disease, and how the heart and lungs work together.  Nutrition for Pulmonary Patient:  -Group instruction provided by PowerPoint slides, verbal discussion, and written materials to support subject matter. The instructor gives an explanation and review of healthy diet recommendations, which includes a discussion on weight management, recommendations for fruit and vegetable consumption, as well as protein, fluid, caffeine, fiber, sodium, sugar, and alcohol. Tips for eating when patients are short of breath are discussed.   Pursed Lip Breathing:  -Group instruction that is supported by demonstration and informational handouts. Instructor discusses the benefits of pursed lip and diaphragmatic breathing and detailed demonstration on how to preform both.     Oxygen Safety:  -Group instruction provided by PowerPoint, verbal discussion, and written material to support subject matter. There is an overview of "What is Oxygen" and "Why do we need it".  Instructor also reviews how to create a safe environment for oxygen use, the importance of using oxygen as prescribed, and the risks of noncompliance. There is a brief discussion on traveling with oxygen and resources the patient may utilize.   PULMONARY REHAB CHRONIC OBSTRUCTIVE PULMONARY DISEASE from 11/09/2017 in [facility] CARDIAC REHAB  Date  11/09/17  Educator  Kirt Boys  Instruction Review Code  1- Verbalizes Understanding      Oxygen Equipment:  -Group instruction provided by [employer] utilizing handouts, written materials, and Chief Technology Officer.   Signs and Symptoms:  -Group instruction provided by written material and verbal discussion to support subject matter. Warning signs and symptoms of infection, stroke, and heart attack are reviewed and when to call the physician/911 reinforced. Tips for preventing the spread of infection discussed.   PULMONARY REHAB CHRONIC OBSTRUCTIVE PULMONARY DISEASE from 11/09/2017 in [facility] CARDIAC REHAB  Date  11/02/17  Educator  Aurea Graff  Instruction Review Code  1- Verbalizes Understanding      Advanced Directives:  -Group instruction provided by verbal instruction and written material to support subject matter. Instructor reviews Advanced Directive laws and proper instruction for filling out document.   Pulmonary Video:  -Group video education that reviews the importance of medication and oxygen compliance, exercise, good nutrition, pulmonary hygiene, and pursed lip and diaphragmatic breathing for the pulmonary patient.   Exercise for the Pulmonary Patient:  -Group instruction that is supported by a PowerPoint presentation. Instructor discusses benefits of exercise, core components of exercise, frequency, duration, and intensity of an exercise routine, importance of utilizing pulse oximetry during exercise, safety while exercising, and options of places to exercise outside of rehab.     Pulmonary Medications:  -Verbally interactive group education provided by instructor with focus on inhaled medications and proper administration.   Anatomy and Physiology of the Respiratory System and Intimacy:  -Group instruction provided by PowerPoint, verbal discussion, and written material to support subject matter. Instructor reviews respiratory cycle and anatomical components of the respiratory system and their functions. Instructor also reviews differences in obstructive and restrictive respiratory diseases with  examples of each. Intimacy, Sex, and Sexuality differences are reviewed with a discussion on how relationships can change when diagnosed with pulmonary disease. Common sexual concerns are reviewed.   MD DAY -A group question and answer session with a medical doctor that allows participants to ask questions that relate to their pulmonary disease state.   OTHER EDUCATION -Group or individual verbal, written, or video instructions that support the educational goals of the pulmonary rehab program.   Holiday Eating Survival Tips:  -Group instruction provided by PowerPoint slides, verbal discussion, and written materials to support subject  matter. The instructor gives patients tips, tricks, and techniques to help them not only survive but enjoy the holidays despite the onslaught of food that accompanies the holidays.   Knowledge Questionnaire Score: Knowledge Questionnaire Score - 10/23/17 0943      Knowledge Questionnaire Score   Pre Score  15/18       Core Components/Risk Factors/Patient Goals at Admission: Personal Goals and Risk Factors at Admission - 10/23/17 0958      Core Components/Risk Factors/Patient Goals on Admission   Improve shortness of breath with ADL's  Yes    Intervention  Provide education, individualized exercise plan and daily activity instruction to help decrease symptoms of SOB with activities of daily living.    Expected Outcomes  Short Term: Improve cardiorespiratory fitness to achieve a reduction of symptoms when performing ADLs;Long Term: Be able to perform more ADLs without symptoms or delay the onset of symptoms       Core Components/Risk Factors/Patient Goals Review:  Goals and Risk Factor Review    Row Name 10/23/17 0959 11/13/17 1544 11/15/17 1201         Core Components/Risk Factors/Patient Goals Review   Personal Goals Review  Improve shortness of breath with ADL's;Increase knowledge of respiratory medications and ability to use respiratory devices  properly.;Develop more efficient breathing techniques such as purse lipped breathing and diaphragmatic breathing and practicing self-pacing with activity.  Improve shortness of breath with ADL's;Increase knowledge of respiratory medications and ability to use respiratory devices properly.;Develop more efficient breathing techniques such as purse lipped breathing and diaphragmatic breathing and practicing self-pacing with activity.  -     Review  -  Pt is off to a good start in pulmonary rehab. Pt has completed 3 exercise classess. Pt has participated in oxygen safety education class.  Pt demonstrates appropriate breathing techniques.  Pt observed using PLB and diaphragmatic breathing.Pt able to maintain o2 sat 95-98%.  -     Expected Outcomes  -  See Admission Goals  See Admission Goals/Outcomes        Core Components/Risk Factors/Patient Goals at Discharge (Final Review):  Goals and Risk Factor Review - 11/15/17 1201      Core Components/Risk Factors/Patient Goals Review   Expected Outcomes  See Admission Goals/Outcomes       ITP Comments: ITP Comments    Row Name 11/13/17 1544           ITP Comments  Dr. Koren Bound, Medical Director          Comments:  Pt has completed 3 exercise session.  [facility] until 02/20/18. Alanson Aly, BSN Cardiac and Pulmonary Rehab Nurse Navigator '

## 2017-11-14 ENCOUNTER — Encounter (HOSPITAL_COMMUNITY)
Admission: RE | Admit: 2017-11-14 | Discharge: 2017-11-14 | Disposition: A | Discharge status: Home / Self Care | Payer: No Typology Code available for payment source | Source: Ambulatory Visit | Attending: Pulmonary Disease | Admitting: Pulmonary Disease

## 2017-11-14 VITALS — Wt 168.9 lb

## 2017-11-14 DIAGNOSIS — J449 Chronic obstructive pulmonary disease, unspecified: Secondary | ICD-10-CM | POA: Diagnosis not present

## 2017-11-14 NOTE — Progress Notes (Signed)
Daily Session Note  Patient Details  Name: Gerald Williams MRN: [id number] Date of Birth: 03/14/1951 Referring Provider:     Pulmonary Rehab Walk Test from 10/24/2017 in [facility]  Referring Provider  Dr. Nelda Marseille      Encounter Date: 11/14/2017  Check In: Session Check In - 11/14/17 1030      Check-In   Location  [facility]    Staff Present  Rosebud Poles, RN, BSN;Carlette Carlton, RN, [facility], MS, ACSM RCEP, Exercise Physiologist;Lisa Ysidro Evert, Felipe Drone, RN, [facility]    Supervising physician immediately available to respond to emergencies  [facility] immediately available    Physician(s)  Dr. Denton Brick    Medication changes reported      No    Fall or balance concerns reported     No    Tobacco Cessation  No Change    Warm-up and Cool-down  Performed as group-led instruction    Resistance Training Performed  Yes    VAD Patient?  No    PAD/SET Patient?  No      Pain Assessment   Currently in Pain?  No/denies    Multiple Pain Sites  No       Capillary Blood Glucose: No results found for this or any previous visit (from the past 24 hour(s)).  Exercise Prescription Changes - 11/14/17 1200      Response to Exercise   Blood Pressure (Admit)  106/60    Blood Pressure (Exercise)  140/80    Blood Pressure (Exit)  140/68    Heart Rate (Admit)  71 bpm    Heart Rate (Exercise)  92 bpm    Heart Rate (Exit)  69 bpm    Oxygen Saturation (Admit)  97 %    Oxygen Saturation (Exercise)  94 %    Oxygen Saturation (Exit)  96 %    Rating of Perceived Exertion (Exercise)  11    Perceived Dyspnea (Exercise)  3    Duration  Progress to 45 minutes of aerobic exercise without signs/symptoms of physical distress    Intensity  THRR unchanged      Progression   Progression  Continue to progress workloads to maintain intensity without signs/symptoms of physical distress.      Resistance Training   Training Prescription   Yes    Weight  blue bands    Reps  10-15    Time  17 Minutes      Interval Training   Interval Training  No      Oxygen   Oxygen  Continuous    Liters  3      Bike   Level  0.6    Minutes  17      NuStep   Level  3    SPM  80    Minutes  17    METs  2.6      Track   Laps  7    Minutes  17       Social History   Tobacco Use  Smoking Status Former Smoker  . Packs/day: 1.50  . Years: 50.00  . Pack years: 75.00  . Types: Cigarettes  . Start date: 04/26/1963  . Last attempt to quit: 09/22/2013  . Years since quitting: 4.1  Smokeless Tobacco Never Used  Tobacco Comment   patient using the patch, losenges, and medication    Goals Met:  Exercise tolerated well Strength training completed today  Goals Unmet:  Not  Applicable  Comments: Service time is from 1030 to 1215   Dr. Rush Farmer is Medical Director for Pulmonary Rehab at [facility].

## 2017-11-16 ENCOUNTER — Encounter (HOSPITAL_COMMUNITY): Payer: No Typology Code available for payment source

## 2017-11-21 ENCOUNTER — Encounter (HOSPITAL_COMMUNITY): Payer: No Typology Code available for payment source

## 2017-11-22 DIAGNOSIS — R0902 Hypoxemia: Secondary | ICD-10-CM | POA: Diagnosis not present

## 2017-11-22 DIAGNOSIS — J432 Centrilobular emphysema: Secondary | ICD-10-CM | POA: Diagnosis not present

## 2017-11-23 ENCOUNTER — Encounter (HOSPITAL_COMMUNITY)
Admission: RE | Admit: 2017-11-23 | Discharge: 2017-11-23 | Disposition: A | Discharge status: Home / Self Care | Payer: Non-veteran care | Source: Ambulatory Visit | Attending: Pulmonary Disease | Admitting: Pulmonary Disease

## 2017-11-23 DIAGNOSIS — G4730 Sleep apnea, unspecified: Secondary | ICD-10-CM | POA: Insufficient documentation

## 2017-11-23 DIAGNOSIS — F4310 Post-traumatic stress disorder, unspecified: Secondary | ICD-10-CM | POA: Insufficient documentation

## 2017-11-23 DIAGNOSIS — I10 Essential (primary) hypertension: Secondary | ICD-10-CM | POA: Insufficient documentation

## 2017-11-23 DIAGNOSIS — E785 Hyperlipidemia, unspecified: Secondary | ICD-10-CM | POA: Insufficient documentation

## 2017-11-23 DIAGNOSIS — Z87891 Personal history of nicotine dependence: Secondary | ICD-10-CM | POA: Insufficient documentation

## 2017-11-23 DIAGNOSIS — J449 Chronic obstructive pulmonary disease, unspecified: Secondary | ICD-10-CM

## 2017-11-28 ENCOUNTER — Encounter (HOSPITAL_COMMUNITY): Payer: Non-veteran care

## 2017-11-30 ENCOUNTER — Encounter (HOSPITAL_COMMUNITY): Payer: Non-veteran care

## 2017-12-05 ENCOUNTER — Encounter (HOSPITAL_COMMUNITY): Payer: Non-veteran care

## 2017-12-07 ENCOUNTER — Encounter (HOSPITAL_COMMUNITY): Payer: Non-veteran care

## 2017-12-08 NOTE — Addendum Note (Signed)
Encounter addended by: Enid Skeens, RD on: 12/08/2017 9:04 AM  Actions taken: Flowsheet data copied forward, Visit Navigator Flowsheet section accepted

## 2017-12-12 ENCOUNTER — Encounter (HOSPITAL_COMMUNITY): Payer: Non-veteran care

## 2017-12-14 ENCOUNTER — Encounter (HOSPITAL_COMMUNITY): Payer: Non-veteran care

## 2017-12-14 DIAGNOSIS — J432 Centrilobular emphysema: Secondary | ICD-10-CM | POA: Diagnosis not present

## 2017-12-14 DIAGNOSIS — R0902 Hypoxemia: Secondary | ICD-10-CM | POA: Diagnosis not present

## 2017-12-19 ENCOUNTER — Encounter (HOSPITAL_COMMUNITY): Payer: Non-veteran care

## 2017-12-21 ENCOUNTER — Encounter (HOSPITAL_COMMUNITY): Payer: Non-veteran care

## 2017-12-23 DIAGNOSIS — R0902 Hypoxemia: Secondary | ICD-10-CM | POA: Diagnosis not present

## 2017-12-23 DIAGNOSIS — J432 Centrilobular emphysema: Secondary | ICD-10-CM | POA: Diagnosis not present

## 2017-12-26 ENCOUNTER — Encounter (HOSPITAL_COMMUNITY): Payer: Non-veteran care

## 2017-12-26 NOTE — Progress Notes (Signed)
Discharge Progress Report  Patient Details  Name: Gerald Williams MRN: [id number] Date of Birth: October 13, 1950 Referring Provider:     Pulmonary Rehab Walk Test from 10/24/2017 [facility]  Referring Provider  Dr. Molli Knock       Number of Visits: 4 Last exercise session was on 7/23 Reason for Discharge:  Early Exit:  pt needed to care for his wife who recently had surgery.  Smoking History:  Social History   Tobacco Use  Smoking Status Former Smoker  . Packs/day: 1.50  . Years: 50.00  . Pack years: 75.00  . Types: Cigarettes  . Start date: 04/26/1963  . Last attempt to quit: 09/22/2013  . Years since quitting: 4.2  Smokeless Tobacco Never Used  Tobacco Comment   patient using the patch, losenges, and medication    Diagnosis:  Chronic obstructive pulmonary disease, unspecified COPD type (HCC)  ADL UCSD: Pulmonary Assessment Scores    Row Name 10/23/17 0951 10/24/17 1554       ADL UCSD   ADL Phase  Entry  Entry    SOB Score total  80  -      CAT Score   CAT Score  29  -      mMRC Score   mMRC Score  -  3       Initial Exercise Prescription: Initial Exercise Prescription - 10/24/17 1500      Date of Initial Exercise RX and Referring Provider   Date  10/24/17    Referring Provider  Dr. Molli Knock      Oxygen   Oxygen  Continuous    Liters  3      Bike   Level  0.6    Minutes  17      NuStep   Level  2    SPM  80    Minutes  17    METs  1.5      Track   Laps  10    Minutes  17      Prescription Details   Frequency (times per week)  2    Duration  Progress to 45 minutes of aerobic exercise without signs/symptoms of physical distress      Intensity   THRR 40-80% of Max Heartrate  62-124    Ratings of Perceived Exertion  11-13    Perceived Dyspnea  0-4      Progression   Progression  Continue progressive overload as per policy without signs/symptoms or physical distress.      Resistance Training   Training  Prescription  Yes    Weight  blue bands    Reps  10-15       Discharge Exercise Prescription (Final Exercise Prescription Changes): Exercise Prescription Changes - 11/30/17 0700      Response to Exercise   Blood Pressure (Admit)  106/60    Blood Pressure (Exercise)  140/80    Blood Pressure (Exit)  140/68    Heart Rate (Admit)  71 bpm    Heart Rate (Exercise)  92 bpm    Heart Rate (Exit)  69 bpm    Oxygen Saturation (Admit)  97 %    Oxygen Saturation (Exercise)  94 %    Oxygen Saturation (Exit)  96 %    Rating of Perceived Exertion (Exercise)  14    Perceived Dyspnea (Exercise)  3    Duration  Progress to 45 minutes of aerobic exercise without signs/symptoms of physical distress  Intensity  THRR unchanged      Progression   Progression  Continue to progress workloads to maintain intensity without signs/symptoms of physical distress.      Resistance Training   Training Prescription  Yes    Weight  blue bands    Reps  10-15    Time  17 Minutes      Interval Training   Interval Training  No      Oxygen   Oxygen  Continuous    Liters  3      Bike   Level  0.6    Minutes  17      NuStep   Level  3    SPM  80    Minutes  17    METs  2.6      Track   Laps  7    Minutes  17       Functional Capacity: 6 Minute Walk    Row Name 10/24/17 1551         6 Minute Walk   Phase  Initial     Distance  1200 feet     Walk Time  6 minutes     # of Rest Breaks  0     MPH  2.27     METS  2.68     RPE  14     Perceived Dyspnea   2     Symptoms  Yes (comment)     Comments  7/10 leg pain (constant pain)     Resting HR  85 bpm     Resting BP  130/80     Resting Oxygen Saturation   93 %     Exercise Oxygen Saturation  during 6 min walk  85 %     Max Ex. HR  111 bpm     Max Ex. BP  144/86     2 Minute Post BP  128/86       Interval HR   1 Minute HR  100     2 Minute HR  106     3 Minute HR  107     4 Minute HR  112     5 Minute HR  107     6 Minute HR  111      2 Minute Post HR  97     Interval Heart Rate?  Yes       Interval Oxygen   Interval Oxygen?  Yes     Baseline Oxygen Saturation %  93 %     1 Minute Oxygen Saturation %  93 %     1 Minute Liters of Oxygen  0 L     2 Minute Oxygen Saturation %  90 %     2 Minute Liters of Oxygen  0 L     3 Minute Oxygen Saturation %  87 %     3 Minute Liters of Oxygen  0 L     4 Minute Oxygen Saturation %  86 %     4 Minute Liters of Oxygen  0 L     5 Minute Oxygen Saturation %  85 %     5 Minute Liters of Oxygen  3 L     6 Minute Oxygen Saturation %  89 %     6 Minute Liters of Oxygen  3 L     2 Minute Post Oxygen Saturation %  95 %  2 Minute Post Liters of Oxygen  3 L        Psychological, QOL, Others - Outcomes: PHQ 2/9: Depression screen [facility] 2/9 10/23/2017 07/13/2017 05/23/2017 04/10/2014 12/23/2013  Decreased Interest 0 0 0 0 0  Down, Depressed, Hopeless 0 1 0 0 0  PHQ - 2 Score 0 1 0 0 0  Altered sleeping - 1 - - -  Tired, decreased energy - 0 - - -  Change in appetite - 0 - - -  Feeling bad or failure about yourself  - 0 - - -  Trouble concentrating - 0 - - -  Moving slowly or fidgety/restless - 0 - - -  Suicidal thoughts - 0 - - -  PHQ-9 Score - 2 - - -  Difficult doing work/chores - Not difficult at all - - -    Quality of Life:   Personal Goals: Goals established at orientation with interventions provided to work toward goal. Personal Goals and Risk Factors at Admission - 10/23/17 0958      Core Components/Risk Factors/Patient Goals on Admission   Improve shortness of breath with ADL's  Yes    Intervention  Provide education, individualized exercise plan and daily activity instruction to help decrease symptoms of SOB with activities of daily living.    Expected Outcomes  Short Term: Improve cardiorespiratory fitness to achieve a reduction of symptoms when performing ADLs;Long Term: Be able to perform more ADLs without symptoms or delay the onset of symptoms         Personal Goals Discharge: Goals and Risk Factor Review    Row Name 10/23/17 0959 11/13/17 1544 11/15/17 1201 12/26/17 1716       Core Components/Risk Factors/Patient Goals Review   Personal Goals Review  Improve shortness of breath with ADL's;Increase knowledge of respiratory medications and ability to use respiratory devices properly.;Develop more efficient breathing techniques such as purse lipped breathing and diaphragmatic breathing and practicing self-pacing with activity.  Improve shortness of breath with ADL's;Increase knowledge of respiratory medications and ability to use respiratory devices properly.;Develop more efficient breathing techniques such as purse lipped breathing and diaphragmatic breathing and practicing self-pacing with activity.  -  Improve shortness of breath with ADL's;Increase knowledge of respiratory medications and ability to use respiratory devices properly.;Develop more efficient breathing techniques such as purse lipped breathing and diaphragmatic breathing and practicing self-pacing with activity.    Review  -  Pt is off to a good start in pulmonary rehab. Pt has completed 3 exercise classess. Pt has participated in oxygen safety education class.  Pt demonstrates appropriate breathing techniques.  Pt observed using PLB and diaphragmatic breathing.Pt able to maintain o2 sat 95-98%.  -  Pt discharged with the completion of 4 exercise sessions.  Pt needed to care for his wife who recently had surgery. Pt participated in oxygen safety education and demonstrated appropriate breathing techniques.  Pt used PLB and diaphragmatic breathing with the maintenance of O2 sats 95-98%.    Expected Outcomes  -  See Admission Goals  See Admission Goals/Outcomes  Pt made minimal progress toward the achievment of admission goals and outcome due to participating during a very short period of time       Exercise Goals and Review:   Nutrition & Weight - Outcomes: Pre Biometrics -  10/23/17 0955      Pre Biometrics   Grip Strength  40 kg        Nutrition: Nutrition Therapy & Goals - 11/08/17 1800  Nutrition Therapy   Diet  carb consistent      Personal Nutrition Goals   Nutrition Goal  Identify food quantities necessary to achieve wt loss of  -2# per week to a goal wt loss of 2.7-10.9 kg (6-24 lb) at graduation from pulmonary rehab.    Personal Goal #2  Pt to identify and limit food sources of sodium.      Intervention Plan   Intervention  Prescribe, educate and counsel regarding individualized specific dietary modifications aiming towards targeted core components such as weight, hypertension, lipid management, diabetes, heart failure and other comorbidities.    Expected Outcomes  Short Term Goal: Understand basic principles of dietary content, such as calories, fat, sodium, cholesterol and nutrients.       Nutrition Discharge: Nutrition Assessments - 12/08/17 0903      Rate Your Plate Scores   Pre Score  36    Post Score  --   did not complete      Education Questionnaire Score: Knowledge Questionnaire Score - 10/23/17 0943      Knowledge Questionnaire Score   Pre Score  15/18       Pt did not complete or return for any post assessments.  Pt last exercise session was on 7/23.

## 2017-12-26 NOTE — Addendum Note (Signed)
Encounter addended by: Chelsea Aus, RN on: 12/26/2017 5:20 PM  Actions taken: Episode resolved, Flowsheet data copied forward, Visit Navigator Flowsheet section accepted

## 2017-12-26 NOTE — Addendum Note (Signed)
Encounter addended by: Chelsea Aus, RN on: 12/26/2017 5:25 PM  Actions taken: Sign clinical note

## 2017-12-28 ENCOUNTER — Encounter (HOSPITAL_COMMUNITY): Payer: Non-veteran care

## 2018-01-02 ENCOUNTER — Encounter (HOSPITAL_COMMUNITY): Payer: Non-veteran care

## 2018-01-04 ENCOUNTER — Encounter (HOSPITAL_COMMUNITY): Payer: Non-veteran care

## 2018-01-09 ENCOUNTER — Encounter (HOSPITAL_COMMUNITY): Payer: Non-veteran care

## 2018-01-11 ENCOUNTER — Encounter (HOSPITAL_COMMUNITY): Payer: Non-veteran care

## 2018-01-14 DIAGNOSIS — R0902 Hypoxemia: Secondary | ICD-10-CM | POA: Diagnosis not present

## 2018-01-14 DIAGNOSIS — J432 Centrilobular emphysema: Secondary | ICD-10-CM | POA: Diagnosis not present

## 2018-01-16 ENCOUNTER — Encounter (HOSPITAL_COMMUNITY): Payer: Non-veteran care

## 2018-01-18 ENCOUNTER — Encounter (HOSPITAL_COMMUNITY): Payer: Non-veteran care

## 2018-01-22 DIAGNOSIS — J432 Centrilobular emphysema: Secondary | ICD-10-CM | POA: Diagnosis not present

## 2018-01-22 DIAGNOSIS — R0902 Hypoxemia: Secondary | ICD-10-CM | POA: Diagnosis not present

## 2018-01-23 ENCOUNTER — Encounter (HOSPITAL_COMMUNITY): Payer: Non-veteran care

## 2018-01-25 ENCOUNTER — Encounter (HOSPITAL_COMMUNITY): Payer: Non-veteran care

## 2018-01-29 ENCOUNTER — Encounter: Payer: Self-pay | Admitting: Pulmonary Disease

## 2018-01-29 NOTE — Progress Notes (Signed)
Subjective:     Patient ID: Gerald Williams, male   DOB: 07-30-1950, 67 y.o.   MRN: [id number]  HPI  ~  May 28, 2015:  Initial pulmonary evaluation by SN>   60 y/o BM referred by Dr.Stacey Burns, [facility], for a pulmonary evaluation> He is also followed by the [place] but we do not have any of their records...      He was told he has severe emphysema by the [facility] but he doesn't know any details of his eval; he is an ex-smoker & quit 2 yrs ago;  He notes SOB w/ min activities, sl cough, denies sputum/ hemoptysis/ etc;  He says he was prev [facility] but [facility];  He is treated w/ Symbicort160- 2spBid and NEBULIZER w/ Albut & IpratropiumQid (he averages 1-2 per day)... He also has OSA evaluated at the [facility] & is on CPAP nightly "set 9-13 Auto" he says & he affirms that it is working well, rests well, wakes refreshed and no daytime sleepiness issues; he uses Home O2 bled into his CPAP Qhs...   Smoking Hx>  He is an ex-smoker, starting in his teens, smoked for 50 yrs up to 1+ppd, and quit 2 yrs ago at age 72; est ~60 pack-year smoking hx...  Pulmonary Hx> Hx COPD/ emphysema & OSA [facility] [place]...  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  Current Meds>  Symbicort160-2spBid but only using prn, NEBS w/ Albut & CombiventHFA rescue inhaler prn, ASA81, Diovan80, Lipitor80, Lyrica50Tid, Flexeril10 prn... EXAM shows Afeb, VSS, O2sat=95% on RA at rest;  HEENT- neg, mallampati2;  Chest- decr BS at bases, clear w/o w/r/r;  Heart- RR, distant heart sounds w/o m/r/g;  Abd- soft, neg;  Ext- decr sensation c/w neuropathy w/o c/c/e...  Note from [facility] 03/2014  indicated 65minWT= 1106 feet w/ 0 breaks & lowest O2sat=94% on 4L/min O2 & heart rate 103/min (he participated in rehab 11/2013=>03/2014)  CTAngio Chest 03/13/15>  NEG for PE, COPD & mild posterior segmental atx, no adenopathy, effusion, pneumothorax, etc...  CXR 03/13/15 showed hyperinflation & attenuation of the pulm markings, scarring in lingula, otherw clear/ NAD.Marland KitchenMarland Kitchen   EKG 03/13/15 showed NSR, rate67, NAD...  Spirometry 05/28/15>  FVC=1.35 (40%), FEV1=0.56 (21%), %1sec=42, mid-flows reduced at 7% predicted;  This is c/w severe airflow obstruction and GOLD Stage4 COPD  Ambulatory Oximetry on RA 05/28/15>  O2sat=96% on RA at rest w/ pulse=67/min;  He ambulated 3 laps w/ lowest O2sat=89% w/ heart rate up to 99/min...  LABS 04/2015 in Epic>  Chems- ok x Cr=1.4, A1c=6.2;  CBC- wnl...                       CXR 03/13/15  IMP/PLAN>>  Gerald Williams has severe COPD/emphysema w/ very severe airflow obstruction on his spirometry c/w GOLD Stage4 disease;  He is on a reasonable regimen if he'll take it regularly and the meds are avail to him thru the [place] system- rec that he use the SYMBICORT160- 2sp Bid and the DUONEB Bid spacing the treatments out 4 times daily (see AVS);  He needs to stay off  all tobacco products, stay active w/ exercise program, and avoid infections;  He will call me for any issues w/ his breathing & we will plan a f/u in 45mo...  ~  Aug 26, 2015:  45mo ROV w/ SN>  Gerald Williams is an ex-smoker, quit 2 yrs ago w/ a 60+pack-yr hx, & severe GOLD Stage 4 COPD w/ FEV1=0.56 (21%) Feb2017=> we adjusted his bronchodil regimen w/ NEBULIZER W? Duoneb Bid & SYMBICORT160-2spBid (gets meds from the [place]);  We discussed avoiding infections, and incr exercise;  He feels that his breathing is worse & he is going thru his rescue inhaler Q3wks;  Notes incr cough & some greenish sput over the last few weeks;  He has midsternal CP & a HA each AM that resolves after he takes his BP meds;  He reports a fall last week 7 has a follow up appt  w/ Neurology coming up (neuropathy)...     Severe COPD/emphysema w/ GOLD Stage 4 disease and FEV1=0.56 (21%)>  He was prev on HomeO2 but the [facility] stopped it;     Ex-smoker w/ 60+pack-yr hx smoking and he quit 2015    OSA on CPAP nightly "set 9-13 Auto">  eval and Rx from the [place] & we do not have their data but he confirms good compliance & efficacy- rests well, no daytime hypersomnolence, O2 is bled into the CPAP Qhs by his hx.    Medical issues>  HBP, HL, prediabetes, CATH 07/15/14 by Howell Rucks showed norm coronaries & norm LV w/ EF=60%; C Bruit, Neuropathy, Hx polysubstance abuse- cocaine/ alcohol, & PTSD EXAM shows Afeb, VSS, O2sat=95% on RA at rest;  HEENT- neg, mallampati2;  Chest- decr BS at bases, clear w/o w/r/r;  Heart- RR, distant heart sounds w/o m/r/g;  Abd- soft, neg;  Ext- decr sensation c/w neuropathy w/o c/c/e... IMP/PLAN>>  We need to maximize Gerald Williams's bronchodil regimen- increase NEB w/ DUONEB to TID (VA gives him separate Albut+Ipratropium), continue SYMBICORT160-2spBid, add INCRUSE once daily;  He notes that PTSD is under better control...  ~  December 21, 2015:  67mo ROV & pulm follow up>  Gerald Williams tells me that he is applying for VA disability & he had me write a letter on his behalf because he has severe COPD, was a cigarwette smoker, and exposed to agent orange & cooking fumes while in [place]; he has Stage 4 COPD/ emphysema and OSA evaluated & treated by the [place]... We reviewed the following medical problems during today's office visit >>     Severe COPD/emphysema w/ GOLD Stage 4 disease and FEV1=0.56 (21%)>  He was prev on HomeO2 but the [facility] stopped it; currently taking NEBS w/ Duoneb Tid, followed by Symbicort160-2spBid & Spiriva Respimat-2sp once daily (of course he gets his meds from the [place]; he tells me he exercises in a Gym at a slow pace several days per wk...    Ex-smoker w/ 60+pack-yr hx smoking and he quit 2015.    OSA on CPAP nightly "set 9-13 Auto", & Nocturnal hypoxemia>  eval and Rx from  the [place] & we do not have their data but he confirms good compliance & efficacy- rests well, no daytime hypersomnolence, O2 is bled into the CPAP Qhs by his hx.    Medical issues> his PCP is DrSBurns- HBP (on diovan80), HL (on Lip80), prediabetes, CATH 07/15/14 by Howell Rucks showed norm coronaries & norm LV w/ EF=60%; C Bruit, Neuropathy, Hx polysubstance abuse- cocaine/ alcohol, & PTSD... He had a recent ROV w/ DrBurns 12/16/15 (note  reviewed by me in epic). EXAM shows Afeb, VSS, O2sat=95% on RA at rest;  HEENT- neg, mallampati2;  Chest- decr BS at bases, clear w/o w/r/r;  Heart- RR, distant heart sounds w/o m/r/g;  Abd- soft, neg;  Ext- decr sensation c/w neuropathy w/o c/c/e...  LABS 5-6/12/17 (reviewed by me in epic) showed Chems- wnl;  Low Thiamine level... IMP/PLAN>>  Gerald Williams has severe COPD/ emphysema & OSA w/ nocturnal hypoxemia; a lot of his effort these days is going into a quest for disability from the [place]; we reviewed the severity of his lung dis & the need for regular dosing of his meds, regular exercise, and the regular use of his CPAP & nocturnal O2... Note: >50% of this 25 min visit was spent in counseling & coordination of care... We plan an ROV recheck in 72mo, sooner if needed prn...  ~  May 03, 2016:  4-68mo ROV & add-on appt requested to discuss P[facility]>  Rudy has Stage4 COPD/emphysema w/ a baseline FEV1=0.56L (21% predicted)- on Home O2 per the [place], NEBs w/ Albut & Ipratrpium TID followed by Symbicort160-2spBid & Spiriva Respimat-2 inhalations daily;  He is also on CPAP per the [place] but we don't have any of their notes/ data; his PCP is in [place] & his pulm specialist is also in [place];  He had these breathing tests ordered by the [place] for his "compensation & pension" hearings... He recently had Full PFTs done at the [facility] & brought those here for my interpretation & review... See prob list above>  NOTE: he prev did 6 classes at [facility] but he states that he is NOT able to exercise &  had to quit;  He feels that all this is related to his time in [place] & PTSD...  EXAM shows Afeb, VSS, O2sat=95% on RA at rest;  HEENT- neg, mallampati2;  Chest- decr BS at bases, clear w/o w/r/r;  Heart- RR, distant heart sounds w/o m/r/g;  Abd- soft, neg;  Ext- decr sensation c/w neuropathy w/o c/c/e...  [facility]>  FVC=2.03 (52%), FEV1=1.00 (36%), %1sec=49, mid-flows reduced at 17% predicted; no improvement in FEV1 after bronchodil- TLC=4.34 (77%), RV=2.01 (93%), RV/TLC=46%;  DLCO=38% predicted;  This is c/w severe airflow obstruction, GOLD Stage3 COPD w/ emphysema...  Gerald Williams tells me that he was also walked at the [place] while breathing room air & his O2 sat dropped to 81% IMP/PLAN>>  Gerald Williams has severe COPD/emphysema & the [facility] has documented both nocturnal hypoxemia and exercise induced hypoxemia- they are re-instating home oxygen therapy;  We reviewed his rec medical regimen w/ NEBS Tid followed by Symbicort & Spiriva, plus his oxygen;  We spent some time reviewing his treatment regimen and the need for a regular exercise program..Marland KitchenNote: >50% of this 30 min ROV was spent in counseling & coordination of care...   ~  November 07, 2017:  14mo ROV & pulmonary follow up visit>        Past Medical History:  Diagnosis Date  . Emphysema   . Erectile dysfunction   . Hyperlipemia   . Hypertension   . Polysubstance abuse (HCC)    stopped using crack/cocaine December 4th 20-14  . PTSD (post-traumatic stress disorder)   . Sleep apnea     Past Surgical History:  Procedure Laterality Date  . CARDIAC CATHETERIZATION    . LEFT HEART CATHETERIZATION WITH CORONARY ANGIOGRAM N/A 07/15/2014   Procedure: LEFT HEART CATHETERIZATION WITH CORONARY ANGIOGRAM;  Surgeon: Lennette Bihari,  MD;  Location: [facility] CATH LAB;  Service: Cardiovascular;  Laterality: N/A;    Outpatient Encounter Medications as of 11/07/2017  Medication Sig  . acetaminophen (TYLENOL) 325 MG tablet Take 650 mg by mouth every 6 (six) hours  as needed.  Marland Kitchen albuterol (PROVENTIL HFA;VENTOLIN HFA) 108 (90 BASE) MCG/ACT inhaler Inhale 2 puffs into the lungs every 6 (six) hours as needed for shortness of breath.  Marland Kitchen albuterol (PROVENTIL) (2.5 MG/3ML) 0.083% nebulizer solution Take 2.5 mg by nebulization 2 (two) times daily.   Marland Kitchen albuterol-ipratropium (COMBIVENT) 18-103 MCG/ACT inhaler Inhale 1 puff into the lungs every 6 (six) hours as needed for wheezing.  Marland Kitchen aspirin 81 MG tablet Take 81 mg by mouth daily.  . budesonide-formoterol (SYMBICORT) 160-4.5 MCG/ACT inhaler Inhale 2 puffs into the lungs 2 (two) times daily.  . finasteride (PROSCAR) 5 MG tablet Take 5 mg by mouth daily.  Marland Kitchen glucose blood (COOL BLOOD GLUCOSE TEST STRIPS) test strip Used to test blood sugar x2 daily---diagnosis code r73.03--for one touch verio flex  . ipratropium (ATROVENT) 0.02 % nebulizer solution Take 0.5 mg by nebulization 3 (three) times daily.  . Lancets MISC Used to test blood sugar x2 daily---diagnosis code r73.03--for one touch verio flex  . lisinopril (PRINIVIL,ZESTRIL) 20 MG tablet Take 40 mg by mouth daily.   . mirtazapine (REMERON) 15 MG tablet Take 15 mg by mouth 2 (two) times daily.   . NON FORMULARY at bedtime. CPAP - [facility]  . nortriptyline (PAMELOR) 10 MG capsule Take 1 capsule (10 mg total) by mouth at bedtime.  Bertram Gala Glycol-Propyl Glycol 0.4-0.3 % SOLN Apply 2 drops to eye as needed.  . pregabalin (LYRICA) 150 MG capsule Take 150 mg by mouth 2 (two) times daily.  Marland Kitchen terazosin (HYTRIN) 2 MG capsule Take 2 mg by mouth once.  . valsartan (DIOVAN) 80 MG tablet Take 1 tablet (80 mg total) by mouth daily.  . vardenafil (LEVITRA) 20 MG tablet Take 20 mg by mouth daily as needed for erectile dysfunction.  . [DISCONTINUED] sildenafil (VIAGRA) 100 MG tablet Take 100 mg by mouth daily as needed for erectile dysfunction.   No facility-administered encounter medications on file as of 11/07/2017.     No Known Allergies   Immunization History    Administered Date(s) Administered  . Influenza,inj,Quad PF,6+ Mos 01/11/2016  . Influenza-Unspecified 01/03/2015, 12/24/2016    Current Medications, Allergies, Past Medical History, Past Surgical History, Family History, and Social History were reviewed in [employer] record.   Review of Systems             All symptoms NEG except where BOLDED >>  Constitutional:  F/C/S, fatigue, anorexia, unexpected weight change. HEENT:  HA, visual changes, hearing loss, earache, nasal symptoms, sore throat, mouth sores, hoarseness. Resp:  cough, sputum, hemoptysis; SOB, tightness, wheezing. Cardio:  CP, palpit, DOE, orthopnea, edema. GI:  N/V/D/C, blood in stool; reflux, abd pain, distention, gas. GU:  dysuria, freq, urgency, hematuria, flank pain, voiding difficulty. MS:  joint pain, swelling, tenderness, decr ROM; neck pain, back pain, etc. Neuro:  HA, tremors, seizures, dizziness, syncope, weakness, numbness, gait abn. Skin:  suspicious lesions or skin rash. Heme:  adenopathy, bruising, bleeding. Psyche:  confusion, agitation, sleep disturbance, hallucinations, anxiety, depression suicidal.   Objective:   Physical Exam       Vital Signs:  Reviewed...   General:  WD, WN, 67 y/o BM in NAD; alert & oriented; pleasant & cooperative... HEENT:  [place]/AT; Conjunctiva- pink, Sclera-  nonicteric, EOM-wnl, PERRLA, Fundi-benign; EACs-clear, TMs-wnl; NOSE-clear; THROAT-clear & wnl.  Neck:  Supple w/ fair ROM; no JVD; normal carotid impulses w/o bruits; no thyromegaly or nodules palpated; no lymphadenopathy.  Chest:  DecrBS bilat, clear to P & A; without wheezes, rales, or rhonchi heard (diminished BS bilat) Heart:  Regular Rhythm; norm S1 & S2 without murmurs, rubs, or gallops detected. Abdomen:  Soft & nontender- no guarding or rebound; normal bowel sounds; no organomegaly or masses palpated. Ext:  decrROM; without deformities +arthritic changes; no varicose veins, +venous insuffic,  or edema;  Pulses intact w/o bruits. Neuro:  CNs II-XII intact; motor testing normal; sensory testing normal; gait normal & balance OK. Derm:  No lesions noted; no rash etc. Lymph:  No cervical, supraclavicular, axillary, or inguinal adenopathy palpated.   Assessment:      IMP >>     Severe COPD/emphysema w/ GOLD Stage 3-4 disease and prev FEV1=0.56 (21%), now 1.00 (38%)> on O2, NEBS, Symbicort, Spiriva...    Ex-smoker w/ 60+pack-yr hx smoking and he quit 2015    OSA on CPAP nightly "set 9-13 Auto">  eval and Rx from the [place] & we do not have their data but he confirms good compliance & efficacy- rests well, no daytime hypersomnolence, O2 is bled into the CPAP Qhs by his hx.    Medical issues>  HBP, HL, prediabetes, CATH 07/15/14 by Howell Rucks showed norm coronaries & norm LV w/ EF=60%; C Bruit, Neuropathy, Hx polysubstance abuse- cocaine/ alcohol, & PTSD  PLAN>>   05/28/15>   Johnwesley has severe COPD/emphysema w/ very severe airflow obstruction on his spirometry c/w GOLD Stage4 disease;  He is on a reasonable regimen if he'll take it regularly and the meds are avail to him thru the [place] system- rec that he use the SYMBICORT160- 2sp Bid and the DUONEB Bid spacing the treatments out 4 times daily (see AVS);  He needs to stay off all tobacco products, stay active w/ exercise program, and avoid infections;   5/3>   We need to maximize Gerald Williams's bronchodil regimen- increase NEB w/ DUONEB to TID ([facility] gives him separate Albut+Ipratropium), continue SYMBICORT160-2spBid, add INCRUSE once daily;  He notes that PTSD is under better control... 12/21/15>   Gerald Williams has severe COPD/ emphysema & OSA w/ nocturnal hypoxemia; a lot of his effort these days is going into a quest for disability from the [place]; we reviewed the severity of his lung dis & the need for regular dosing of his meds, regular exercise, and the regular use of his CPAP & nocturnal O2. 05/03/16>   Gerald Williams has severe COPD/emphysema & the [facility] has documented both nocturnal  hypoxemia and exercise induced hypoxemia- they are re-instating home oxygen therapy;  We reviewed his rec medical regimen w/ NEBS Tid followed by Symbicort & Spiriva, plus his oxygen;  We spent some time reviewing his treatment regimen and the need for a regular exercise program.     Plan:     Patient's Medications  New Prescriptions   BUDESONIDE-FORMOTEROL (SYMBICORT) 160-4.5 MCG/ACT INHALER    Inhale 2 puffs into the lungs 2 (two) times daily.  Previous Medications   ACETAMINOPHEN (TYLENOL) 325 MG TABLET    Take 650 mg by mouth every 6 (six) hours as needed.   ALBUTEROL (PROVENTIL HFA;VENTOLIN HFA) 108 (90 BASE) MCG/ACT INHALER    Inhale 2 puffs into the lungs every 6 (six) hours as needed for shortness of breath.   ALBUTEROL (PROVENTIL) (2.5 MG/3ML) 0.083% NEBULIZER SOLUTION    Take  2.5 mg by nebulization 2 (two) times daily.    ALBUTEROL-IPRATROPIUM (COMBIVENT) 18-103 MCG/ACT INHALER    Inhale 1 puff into the lungs every 6 (six) hours as needed for wheezing.   ASPIRIN 81 MG TABLET    Take 81 mg by mouth daily.   BUDESONIDE-FORMOTEROL (SYMBICORT) 160-4.5 MCG/ACT INHALER    Inhale 2 puffs into the lungs 2 (two) times daily.   FINASTERIDE (PROSCAR) 5 MG TABLET    Take 5 mg by mouth daily.   GLUCOSE BLOOD (COOL BLOOD GLUCOSE TEST STRIPS) TEST STRIP    Used to test blood sugar x2 daily---diagnosis code r73.03--for one touch verio flex   IPRATROPIUM (ATROVENT) 0.02 % NEBULIZER SOLUTION    Take 0.5 mg by nebulization 3 (three) times daily.   LANCETS MISC    Used to test blood sugar x2 daily---diagnosis code r73.03--for one touch verio flex   LISINOPRIL (PRINIVIL,ZESTRIL) 20 MG TABLET    Take 40 mg by mouth daily.    MIRTAZAPINE (REMERON) 15 MG TABLET    Take 15 mg by mouth 2 (two) times daily.    NON FORMULARY    at bedtime. CPAP - followed at [facility]   NORTRIPTYLINE (PAMELOR) 10 MG CAPSULE    Take 1 capsule (10 mg total) by mouth at bedtime.   POLYETHYL GLYCOL-PROPYL GLYCOL 0.4-0.3 % SOLN    Apply 2  drops to eye as needed.   PREGABALIN (LYRICA) 150 MG CAPSULE    Take 150 mg by mouth 2 (two) times daily.   TERAZOSIN (HYTRIN) 2 MG CAPSULE    Take 2 mg by mouth once.   VALSARTAN (DIOVAN) 80 MG TABLET    Take 1 tablet (80 mg total) by mouth daily.   VARDENAFIL (LEVITRA) 20 MG TABLET    Take 20 mg by mouth daily as needed for erectile dysfunction.  Modified Medications   No medications on file  Discontinued Medications   SILDENAFIL (VIAGRA) 100 MG TABLET    Take 100 mg by mouth daily as needed for erectile dysfunction.

## 2018-01-30 ENCOUNTER — Encounter (HOSPITAL_COMMUNITY): Payer: Non-veteran care

## 2018-02-01 ENCOUNTER — Encounter (HOSPITAL_COMMUNITY): Payer: Non-veteran care

## 2018-02-06 ENCOUNTER — Encounter (HOSPITAL_COMMUNITY): Payer: Non-veteran care

## 2018-02-08 ENCOUNTER — Encounter (HOSPITAL_COMMUNITY): Payer: Non-veteran care

## 2018-02-13 ENCOUNTER — Encounter (HOSPITAL_COMMUNITY): Payer: Non-veteran care

## 2018-02-13 DIAGNOSIS — J432 Centrilobular emphysema: Secondary | ICD-10-CM | POA: Diagnosis not present

## 2018-02-13 DIAGNOSIS — R0902 Hypoxemia: Secondary | ICD-10-CM | POA: Diagnosis not present

## 2018-02-15 ENCOUNTER — Encounter (HOSPITAL_COMMUNITY): Payer: Non-veteran care

## 2018-02-20 ENCOUNTER — Encounter (HOSPITAL_COMMUNITY): Payer: Non-veteran care

## 2018-02-22 ENCOUNTER — Encounter (HOSPITAL_COMMUNITY): Payer: Non-veteran care

## 2018-02-22 DIAGNOSIS — R0902 Hypoxemia: Secondary | ICD-10-CM | POA: Diagnosis not present

## 2018-02-22 DIAGNOSIS — J432 Centrilobular emphysema: Secondary | ICD-10-CM | POA: Diagnosis not present

## 2018-02-27 ENCOUNTER — Encounter (HOSPITAL_COMMUNITY): Payer: Non-veteran care

## 2018-03-01 ENCOUNTER — Encounter (HOSPITAL_COMMUNITY): Payer: Non-veteran care

## 2018-03-16 DIAGNOSIS — R0902 Hypoxemia: Secondary | ICD-10-CM | POA: Diagnosis not present

## 2018-03-16 DIAGNOSIS — J432 Centrilobular emphysema: Secondary | ICD-10-CM | POA: Diagnosis not present

## 2018-03-24 DIAGNOSIS — J432 Centrilobular emphysema: Secondary | ICD-10-CM | POA: Diagnosis not present

## 2018-03-24 DIAGNOSIS — R0902 Hypoxemia: Secondary | ICD-10-CM | POA: Diagnosis not present

## 2018-04-09 ENCOUNTER — Ambulatory Visit (INDEPENDENT_AMBULATORY_CARE_PROVIDER_SITE_OTHER): Payer: Medicare Other | Admitting: Nurse Practitioner

## 2018-04-09 ENCOUNTER — Encounter: Payer: Self-pay | Admitting: Nurse Practitioner

## 2018-04-09 DIAGNOSIS — Z9989 Dependence on other enabling machines and devices: Secondary | ICD-10-CM

## 2018-04-09 DIAGNOSIS — J432 Centrilobular emphysema: Secondary | ICD-10-CM

## 2018-04-09 DIAGNOSIS — G4733 Obstructive sleep apnea (adult) (pediatric): Secondary | ICD-10-CM

## 2018-04-09 MED ORDER — BUDESONIDE-FORMOTEROL FUMARATE 160-4.5 MCG/ACT IN AERO: 2.0000 | INHALATION_SPRAY | Freq: Two times a day (BID) | RESPIRATORY_TRACT | 0 refills | Status: DC

## 2018-04-09 NOTE — Patient Instructions (Addendum)
[facility] has you on a pulmonary regimen including:    NEBULIZER w/ Albuterol & Ipratropium 3 times daily...    SYMBICORT 160- 2 sprays twice daily    COMBIVENT rescue inhaler as needed...  Tha VA also has you on CPAP at night- continue their recommendations...  The [facility] has also [facility] to [facility] program...    Continue to participate in this valuable treatment option!!!  At last visit we did an ambulatory oximetry test>      Your oxygen level dropped to 87% saturation after 2 laps in the office (pulse rate= 88/min)    We then applied oxygen at 2L/min flow via a pulse generator & you maintained your oxygen level in the 90's...  Unfortunately, Patient cannot tolerate pulsed POC. He needs continuous portable O2 to use with exertion.  We will order a small light weight portable continuous oxygen concentrator w/ instructions to use w/ activity- at 2L/min flow via continuous generator while you are active

## 2018-04-09 NOTE — Assessment & Plan Note (Addendum)
[facility] has you on a pulmonary regimen including:    NEBULIZER w/ Albuterol & Ipratropium 3 times daily...    SYMBICORT 160- 2 sprays twice daily    COMBIVENT rescue inhaler as needed...  Tha VA also has you on CPAP at night- continue their recommendations...  The [facility] has also [facility] to [facility] Re[facility] program...    Continue to participate in this valuable treatment option!!!  Patient Instructions  The [facility] has you on a pulmonary regimen including:    NEBULIZER w/ Albuterol & Ipratropium 3 times daily...    SYMBICORT 160- 2 sprays twice daily    COMBIVENT rescue inhaler as needed...  Tha VA also has you on CPAP at night- continue their recommendations...  The [facility] has also referred you to [facility] program...    Continue to participate in this valuable treatment option!!!  At last visit we did an ambulatory oximetry test>      Your oxygen level dropped to 87% saturation after 2 laps in the office (pulse rate= 88/min)    We then applied oxygen at 2L/min flow via a pulse generator & you maintained your oxygen level in the 90's...  Unfortunately, Patient cannot tolerate pulsed POC. He needs continuous portable O2 to use with exertion.  We will order a small light weight portable continuous oxygen concentrator w/ instructions to use w/ activity- at 2L/min flow via continuous generator while you are active     At last visit we did an ambulatory oximetry test>      Your oxygen level dropped to 87% saturation after 2 laps in the office (pulse rate= 88/min)    We then applied oxygen at 2L/min flow via a pulse generator & you maintained your oxygen level in the 90's...  Patient can not tolerate pulsed POC. He needs continuous portable O2 to use with exertion.  We will order a small light weight portable continuous oxygen concentrator w/ instructions to use theis w/ activity- at 2L/min flow via pulse generator while you are up & about.Marland KitchenMarland Kitchen

## 2018-04-09 NOTE — Assessment & Plan Note (Addendum)
The [facility] also has you on CPAP at night- continue their recommendations...   Patient continues to benefit from CPAP with good compliance  Continue CPAP at current settings Continue current medications Goal of 4 hours or more usage per night Maintain healthy weight Do not drive if drowsy Follow up with Dr. Kriste Basque in 3 months or sooner if needed

## 2018-04-09 NOTE — Progress Notes (Signed)
@Patient  ID: Gerald Williams, male    DOB: [birth year], 67 y.o.   MRN: [id number]  Chief Complaint  Patient presents with  . Follow-up    CPAP    Referring provider: [name], MD  HPI 66 year old male former smoker with COPD/emphysema and OSA on CPAP followed by Dr. Kriste Basque  Tests:  CXR 05/18/17 - Mild scarring left mid lung. Lungs elsewhere clear. Stable cardiac silhouette.  OV - 04/09/18 - follow up Patient presents today for a follow up. He states that over all he has been doing well. He does complain today that he needs a different POC. He has been using 5L pulsed O2 [place] on his current POC. He states that he cannot tolerate the pulsed oxygen. He is requesting a continuous flow POC. He states that he is compliant with CPAP. His CPAP and inhalers are handled through the [place]. He denies any fever, chest pain, or edema.    No Known Allergies  Immunization History  Administered Date(s) Administered  . Influenza,inj,Quad PF,6+ Mos 01/11/2016  . Influenza-Unspecified 01/03/2015, 12/24/2016    Past Medical History:  Diagnosis Date  . Emphysema   . Erectile dysfunction   . Hyperlipemia   . Hypertension   . Polysubstance abuse (HCC)    stopped using crack/cocaine December 4th 20-14  . PTSD (post-traumatic stress disorder)   . Sleep apnea     Tobacco History: Social History   Tobacco Use  Smoking Status Former Smoker  . Packs/day: 1.50  . Years: 50.00  . Pack years: 75.00  . Types: Cigarettes  . Start date: 04/26/1963  . Last attempt to quit: 09/22/2013  . Years since quitting: 4.5  Smokeless Tobacco Never Used  Tobacco Comment   patient using the patch, losenges, and medication   Counseling given: Yes Comment: patient using the patch, losenges, and medication   Outpatient Encounter Medications as of 04/09/2018  Medication Sig  . acetaminophen (TYLENOL) 325 MG tablet Take 650 mg by mouth every 6 (six) hours as needed.  Marland Kitchen albuterol (PROVENTIL HFA;VENTOLIN HFA)  108 (90 BASE) MCG/ACT inhaler Inhale 2 puffs into the lungs every 6 (six) hours as needed for shortness of breath.  Marland Kitchen albuterol (PROVENTIL) (2.5 MG/3ML) 0.083% nebulizer solution Take 2.5 mg by nebulization 2 (two) times daily.   Marland Kitchen albuterol-ipratropium (COMBIVENT) 18-103 MCG/ACT inhaler Inhale 1 puff into the lungs every 6 (six) hours as needed for wheezing.  Marland Kitchen aspirin 81 MG tablet Take 81 mg by mouth daily.  . budesonide-formoterol (SYMBICORT) 160-4.5 MCG/ACT inhaler Inhale 2 puffs into the lungs 2 (two) times daily.  . finasteride (PROSCAR) 5 MG tablet Take 5 mg by mouth daily.  Marland Kitchen glucose blood (COOL BLOOD GLUCOSE TEST STRIPS) test strip Used to test blood sugar x2 daily---diagnosis code r73.03--for one touch verio flex  . ipratropium (ATROVENT) 0.02 % nebulizer solution Take 0.5 mg by nebulization 3 (three) times daily.  . Lancets MISC Used to test blood sugar x2 daily---diagnosis code r73.03--for one touch verio flex  . lisinopril (PRINIVIL,ZESTRIL) 20 MG tablet Take 40 mg by mouth daily.   . mirtazapine (REMERON) 15 MG tablet Take 15 mg by mouth 2 (two) times daily.   . NON FORMULARY at bedtime. CPAP - [facility] at [facility]  . nortriptyline (PAMELOR) 10 MG capsule Take 1 capsule (10 mg total) by mouth at bedtime.  Bertram Gala Glycol-Propyl Glycol 0.4-0.3 % SOLN Apply 2 drops to eye as needed.  . pregabalin (LYRICA) 150 MG capsule Take 150 mg  by mouth 2 (two) times daily.  Marland Kitchen terazosin (HYTRIN) 2 MG capsule Take 2 mg by mouth once.  . valsartan (DIOVAN) 80 MG tablet Take 1 tablet (80 mg total) by mouth daily.  . vardenafil (LEVITRA) 20 MG tablet Take 20 mg by mouth daily as needed for erectile dysfunction.  . budesonide-formoterol (SYMBICORT) 160-4.5 MCG/ACT inhaler Inhale 2 puffs into the lungs 2 (two) times daily.  . [DISCONTINUED] budesonide-formoterol (SYMBICORT) 160-4.5 MCG/ACT inhaler Inhale 2 puffs into the lungs 2 (two) times daily. (Patient not taking: Reported on 04/09/2018)   No  facility-administered encounter medications on file as of 04/09/2018.      Review of Systems  Review of Systems  Constitutional: Negative.  Negative for chills and fever.  HENT: Negative.   Respiratory: Negative for cough, shortness of breath and wheezing.   Cardiovascular: Negative.  Negative for chest pain, palpitations and leg swelling.  Gastrointestinal: Negative.   Allergic/Immunologic: Negative.   Neurological: Negative.   Psychiatric/Behavioral: Negative.        Physical Exam  BP 136/72 (BP Location: Right Arm, Patient Position: Sitting, Cuff Size: Normal)   Pulse 70   Ht 5\' 5"  (1.651 m)   Wt 170 lb 12.8 oz (77.5 kg)   SpO2 93% Comment: on room air  BMI 28.42 kg/m   Wt Readings from Last 5 Encounters:  04/09/18 170 lb 12.8 oz (77.5 kg)  11/14/17 168 lb 14 oz (76.6 kg)  11/09/17 167 lb 12.3 oz (76.1 kg)  11/07/17 167 lb 3.2 oz (75.8 kg)  11/02/17 167 lb 12.3 oz (76.1 kg)     Physical Exam Vitals signs and nursing note reviewed.  Constitutional:      General: He is not in acute distress.    Appearance: He is well-developed.  Cardiovascular:     Rate and Rhythm: Normal rate and regular rhythm.  Pulmonary:     Effort: Pulmonary effort is normal. No respiratory distress.     Breath sounds: Normal breath sounds. No wheezing or rhonchi.  Skin:    General: Skin is warm and dry.  Neurological:     Mental Status: He is alert and oriented to person, place, and time.        Assessment & Plan:   [facility] continue [facility] recommendations...   Patient continues to benefit from CPAP with good compliance  Continue CPAP at current settings Continue current medications Goal of 4 hours or more usage per night Maintain healthy weight Do not drive if drowsy Follow up with Dr. Kriste Basque in 3 months or sooner if needed       COPD (chronic obstructive pulmonary disease) with emphysema (HCC) [facility]  [facility] including:    NEBULIZER w/ Albuterol & Ipratropium 3 times daily...    SYMBICORT 160- 2 sprays twice daily    COMBIVENT rescue inhaler as needed...  [facility] at night- continue their recommendations...  [facility]...    Continue to participate in this valuable treatment option!!!  Patient Instructions  [facility] including:    NEBULIZER w/ Albuterol & Ipratropium 3 times daily...    SYMBICORT 160- 2 sprays twice daily    COMBIVENT rescue inhaler as needed...  [facility] also [facility] at night- continue their recommendations...  [facility] program...    Continue to participate in this valuable treatment option!!!  At last visit we did an ambulatory oximetry test>      Your oxygen level dropped to 87% saturation after 2 laps in the office (pulse rate= 88/min)    We then applied oxygen at 2L/min flow via a pulse generator & you maintained your oxygen level in the 90's...  Unfortunately, Patient cannot tolerate pulsed POC. He needs continuous portable O2 to use with exertion.  We will order a small light weight portable continuous oxygen concentrator w/ instructions to use w/ activity- at 2L/min flow via continuous generator while you are active     At last visit we did an ambulatory oximetry test>      Your oxygen level dropped to 87% saturation after 2 laps in the office (pulse rate= 88/min)    We then applied oxygen at 2L/min flow via a pulse generator & you maintained your oxygen level in the 90's...  Patient can not tolerate pulsed POC. He needs continuous portable O2 to use with exertion.  We will order a small light weight portable continuous oxygen concentrator w/ instructions to use theis w/ activity- at 2L/min flow via pulse generator while you are up & about...      Ivonne Andrew,  NP 04/09/2018

## 2018-04-15 DIAGNOSIS — J432 Centrilobular emphysema: Secondary | ICD-10-CM | POA: Diagnosis not present

## 2018-04-15 DIAGNOSIS — R0902 Hypoxemia: Secondary | ICD-10-CM | POA: Diagnosis not present

## 2018-04-23 ENCOUNTER — Emergency Department (HOSPITAL_COMMUNITY): Payer: Non-veteran care

## 2018-04-23 ENCOUNTER — Other Ambulatory Visit: Payer: Self-pay

## 2018-04-23 ENCOUNTER — Emergency Department (HOSPITAL_COMMUNITY)
Admission: EM | Admit: 2018-04-23 | Discharge: 2018-04-23 | Disposition: A | Discharge status: Home / Self Care | Payer: Non-veteran care | Attending: Emergency Medicine | Admitting: Emergency Medicine

## 2018-04-23 ENCOUNTER — Encounter (HOSPITAL_COMMUNITY): Payer: Self-pay | Admitting: *Deleted

## 2018-04-23 DIAGNOSIS — I10 Essential (primary) hypertension: Secondary | ICD-10-CM | POA: Insufficient documentation

## 2018-04-23 DIAGNOSIS — Z7982 Long term (current) use of aspirin: Secondary | ICD-10-CM | POA: Diagnosis not present

## 2018-04-23 DIAGNOSIS — Z87891 Personal history of nicotine dependence: Secondary | ICD-10-CM | POA: Diagnosis not present

## 2018-04-23 DIAGNOSIS — R072 Precordial pain: Secondary | ICD-10-CM | POA: Diagnosis not present

## 2018-04-23 DIAGNOSIS — Z79899 Other long term (current) drug therapy: Secondary | ICD-10-CM | POA: Diagnosis not present

## 2018-04-23 DIAGNOSIS — J449 Chronic obstructive pulmonary disease, unspecified: Secondary | ICD-10-CM | POA: Insufficient documentation

## 2018-04-23 DIAGNOSIS — R0789 Other chest pain: Secondary | ICD-10-CM | POA: Diagnosis present

## 2018-04-23 LAB — BASIC METABOLIC PANEL
Anion gap: 7 (ref 5–15)
BUN: 16 mg/dL (ref 8–23)
CO2: 28 mmol/L (ref 22–32)
Calcium: 9.1 mg/dL (ref 8.9–10.3)
Chloride: 108 mmol/L (ref 98–111)
Creatinine, Ser: 1.29 mg/dL — ABNORMAL HIGH (ref 0.61–1.24)
GFR calc non Af Amer: 57 mL/min — ABNORMAL LOW (ref >60)
Glucose, Bld: 119 mg/dL — ABNORMAL HIGH (ref 70–99)
Potassium: 4 mmol/L (ref 3.5–5.1)
Sodium: 143 mmol/L (ref 135–145)

## 2018-04-23 LAB — CBC
HCT: 42 % (ref 39.0–52.0)
Hemoglobin: 13.6 g/dL (ref 13.0–17.0)
MCH: 30.7 pg (ref 26.0–34.0)
MCHC: 32.4 g/dL (ref 30.0–36.0)
MCV: 94.8 fL (ref 80.0–100.0)
Platelets: 202 10*3/uL (ref 150–400)
RBC: 4.43 MIL/uL (ref 4.22–5.81)
RDW: 14.2 % (ref 11.5–15.5)
WBC: 5.5 10*3/uL (ref 4.0–10.5)
nRBC: 0 % (ref 0.0–0.2)

## 2018-04-23 LAB — POCT I-STAT TROPONIN I: Troponin i, poc: 0 ng/mL (ref 0.00–0.08)

## 2018-04-23 MED ORDER — PREDNISONE 20 MG PO TABS: 40.0000 mg | ORAL_TABLET | Freq: Every day | ORAL | 0 refills | Status: AC

## 2018-04-23 MED ORDER — SODIUM CHLORIDE (PF) 0.9 % IJ SOLN
INTRAMUSCULAR | Status: AC
  Filled 2018-04-23: qty 50

## 2018-04-23 MED ORDER — IOPAMIDOL (ISOVUE-370) INJECTION 76%
100.0000 mL | Freq: Once | INTRAVENOUS | Status: AC | PRN
  Administered 2018-04-23: 100 mL via INTRAVENOUS

## 2018-04-23 MED ORDER — IOPAMIDOL (ISOVUE-370) INJECTION 76%
INTRAVENOUS | Status: AC
  Filled 2018-04-23: qty 100

## 2018-04-23 MED ORDER — PREDNISONE 20 MG PO TABS: 60.0000 mg | ORAL_TABLET | Freq: Once | ORAL | Status: DC

## 2018-04-23 NOTE — ED Provider Notes (Signed)
[facility]-EMERGENCY DEPT Provider Note   CSN: [id number] Arrival date & time: 04/23/18  1652     History   Chief Complaint Chief Complaint  Patient presents with  . Chest Pain    HPI Gerald Williams is a 67 y.o. male.  HPI Patient is a 67 year old male with a history of oxygen dependent emphysema who presents the emergency department with constant discomfort in the left chest.  No pleuritic nature to his pain.  Reports is worse with palpation.  He feels like he has had mild increased shortness of breath from his baseline but at baseline his breathing is rather poor per the patient.  He is compliant with his bronchodilators.  No fevers or chills.  No productive cough.  No unilateral leg swelling.  No history of DVT or pulmonary embolism.  He underwent heart catheterization in 2016 which demonstrated normal coronary arteries at that time. Past Medical History:  Diagnosis Date  . Emphysema   . Erectile dysfunction   . Hyperlipemia   . Hypertension   . Polysubstance abuse (HCC)    stopped using crack/cocaine December 4th 20-14  . PTSD (post-traumatic stress disorder)   . Sleep apnea     Patient Active Problem List   Diagnosis Date Noted  . Exercise hypoxemia 11/07/2017  . Frequent headaches 05/23/2017  . Acute pain of right knee 12/23/2016  . 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 Blood in stool 10/05/2015  . Essential hypertension 06/18/2015  . Cephalalgia 06/18/2015  . Dyslipidemia 06/12/2015  . COPD mixed type (HCC) 05/28/2015  . Peripheral neuropathy 05/20/2015  . COPD (chronic obstructive pulmonary disease) with emphysema (HCC) 05/20/2015  . PTSD (post-traumatic stress disorder) 05/20/2015  . Right carotid bruit 05/20/2015  . Frequent urination 05/20/2015  . Hematuria 07/15/2014  . History of tobacco abuse:  quit in ~ 06/2013  07/14/2014  . OSA on CPAP 07/14/2014  . Pre-diabetes 07/14/2014  . Claudication, R>L 07/14/2014  .  Cocaine use, history of 03/26/2011  . Chest pain 03/26/2011    Past Surgical History:  Procedure Laterality Date  . CARDIAC CATHETERIZATION    . LEFT HEART CATHETERIZATION WITH CORONARY ANGIOGRAM N/A 07/15/2014   Procedure: LEFT HEART CATHETERIZATION WITH CORONARY ANGIOGRAM;  Surgeon: Lennette Bihari, MD;  Location: [facility] CATH LAB;  Service: Cardiovascular;  Laterality: N/A;        Home Medications    Prior to Admission medications   Medication Sig Start Date End Date Taking? Authorizing Provider  albuterol (PROVENTIL HFA;VENTOLIN HFA) 108 (90 BASE) MCG/ACT inhaler Inhale 2 puffs into the lungs every 6 (six) hours as needed for shortness of breath.   Yes [provider]  albuterol (PROVENTIL) (2.5 MG/3ML) 0.083% nebulizer solution Take 2.5 mg by nebulization 2 (two) times daily.    Yes [provider]  albuterol-ipratropium (COMBIVENT) 18-103 MCG/ACT inhaler Inhale 1 puff into the lungs every 6 (six) hours as needed for wheezing. 08/18/15  Yes Michele Mcalpine, MD  aspirin 81 MG tablet Take 81 mg by mouth daily.   Yes [provider]  budesonide-formoterol (SYMBICORT) 160-4.5 MCG/ACT inhaler Inhale 2 puffs into the lungs 2 (two) times daily. 04/09/18 05/09/18 Yes Ivonne Andrew, NP  finasteride (PROSCAR) 5 MG tablet Take 5 mg by mouth daily.   Yes [provider]  ipratropium (ATROVENT) 0.02 % nebulizer solution Take 0.5 mg by nebulization 3 (three) times daily.   Yes [provider]  lisinopril (PRINIVIL,ZESTRIL) 40 MG tablet  Take 40 mg by mouth daily.   Yes [provider]  mirtazapine (REMERON) 15 MG tablet Take 15 mg by mouth 2 (two) times daily.    Yes [provider]  nortriptyline (PAMELOR) 10 MG capsule Take 1 capsule (10 mg total) by mouth at bedtime. 09/23/15  Yes Patel, Donika K, DO  valsartan (DIOVAN) 80 MG tablet Take 1 tablet (80 mg total) by mouth daily. 05/26/15  Yes Burns, [name], MD  acetaminophen (TYLENOL) 325 MG  tablet Take 650 mg by mouth every 6 (six) hours as needed for moderate pain.     [provider]  budesonide-formoterol (SYMBICORT) 160-4.5 MCG/ACT inhaler Inhale 2 puffs into the lungs 2 (two) times daily. Patient not taking: Reported on 04/23/2018 08/18/15   Michele Mcalpine, MD  glucose blood (COOL BLOOD GLUCOSE TEST STRIPS) test strip Used to test blood sugar x2 daily---diagnosis code r73.03--for one touch verio flex 12/08/15   [name], MD  Lancets MISC Used to test blood sugar x2 daily---diagnosis code r73.03--for one touch verio flex 12/08/15   [name], MD  NON FORMULARY at bedtime. CPAP - followed at [facility]    [provider]  Polyethyl Glycol-Propyl Glycol 0.4-0.3 % SOLN Apply 2 drops to eye as needed. Patient not taking: Reported on 04/23/2018 08/25/16   Nche, Bonna Gains, NP  predniSONE (DELTASONE) 20 MG tablet Take 2 tablets (40 mg total) by mouth daily for 5 days. 04/23/18 04/28/18  Azalia Bilis, MD  vardenafil (LEVITRA) 20 MG tablet Take 20 mg by mouth daily as needed for erectile dysfunction.    [provider]    Family History Family History  Problem Relation Age of Onset  . Diabetes Mother        Deceased  . Heart disease Father        Deceased  . Heart attack Father        x5  . Cancer - Other Sister   . Healthy Son   . Healthy Daughter   . Heart attack Brother        Deceased  . Alcohol abuse Brother        Deceased    Social History Social History   Tobacco Use  . Smoking status: Former Smoker    Packs/day: 1.50    Years: 50.00    Pack years: 75.00    Types: Cigarettes    Start date: 04/26/1963    Last attempt to quit: 09/22/2013    Years since quitting: 4.5  . Smokeless tobacco: Never Used  . Tobacco comment: patient using the patch, losenges, and medication  Substance Use Topics  . Alcohol use: No    Alcohol/week: 0.0 standard drinks    Comment: He was drinking 3-4 drinks nightly x 40 years, quit in 2014.    . Drug use:  No    Comment: history of crack use and drug tested weekly [facility] the [facility]     Allergies   Patient has no known allergies.   Review of Systems Review of Systems  All other systems reviewed and are negative.    Physical Exam Updated Vital Signs BP (!) 139/91 (BP Location: Right Arm)   Pulse 66   Temp 97.7 F (36.5 C) (Oral)   Resp (!) 24   Ht 5\' 5"  (1.651 m)   Wt 73 kg   SpO2 99%   BMI 26.79 kg/m   Physical Exam Vitals signs and nursing note reviewed.  Constitutional:  Appearance: He is well-developed.  HENT:     Head: Normocephalic and atraumatic.  Neck:     Musculoskeletal: Normal range of motion.  Cardiovascular:     Rate and Rhythm: Normal rate and regular rhythm.     Heart sounds: Normal heart sounds.  Pulmonary:     Effort: Pulmonary effort is normal. No respiratory distress.     Breath sounds: Normal breath sounds.  Chest:     Chest wall: Tenderness present.  Abdominal:     General: There is no distension.     Palpations: Abdomen is soft.     Tenderness: There is no abdominal tenderness.  Musculoskeletal: Normal range of motion.        General: No tenderness.     Right lower leg: No edema.     Left lower leg: No edema.  Skin:    General: Skin is warm and dry.  Neurological:     Mental Status: He is alert and oriented to person, place, and time.  Psychiatric:        Judgment: Judgment normal.      ED Treatments / Results  Labs (all labs ordered are listed, but only abnormal results are displayed) Labs Reviewed  BASIC METABOLIC PANEL - Abnormal; Notable for the following components:      Result Value   Glucose, Bld 119 (*)    Creatinine, Ser 1.29 (*)    GFR calc non Af Amer 57 (*)    All other components within normal limits  CBC  I-STAT TROPONIN, ED  POCT I-STAT TROPONIN I    EKG EKG Interpretation  Date/Time:  Monday April 23 2018 16:59:17 EST Ventricular Rate:  78 PR Interval:    QRS Duration: 83 QT Interval:  484 QTC  Calculation: 555 R Axis:   45 Text Interpretation:  Sinus rhythm Borderline low voltage, extremity leads Nonspecific T abnormalities, lateral leads Prolonged QT interval nonspecific inferior lateral T wave changes as compared to ecg Jan 2019 Confirmed by Azalia Bilisampos, Braylan Faul ([id number]) on 04/23/2018 8:01:14 PM   Radiology Dg Chest 2 View  Result Date: 04/23/2018 CLINICAL DATA:  Chest pain. EXAM: CHEST - 2 VIEW COMPARISON:  05/18/2017 FINDINGS: The heart size and mediastinal contours are within normal limits. No infiltrates or effusions. Minimal linear scarring in the mid zones, stable. The visualized skeletal structures are unremarkable. IMPRESSION: No active cardiopulmonary disease. Electronically Signed   By: Francene BoyersJames  Maxwell M.D.   On: 04/23/2018 17:28   Ct Angio Chest Pe W And/or Wo Contrast  Result Date: 04/23/2018 CLINICAL DATA:  Left chest pain for several days. EXAM: CT ANGIOGRAPHY CHEST WITH CONTRAST TECHNIQUE: Multidetector CT imaging of the chest was performed using the standard protocol during bolus administration of intravenous contrast. Multiplanar CT image reconstructions and MIPs were obtained to evaluate the vascular anatomy. CONTRAST:  100mL ISOVUE-370 IOPAMIDOL (ISOVUE-370) INJECTION 76% COMPARISON:  Chest radiograph from earlier today. 03/13/2015 chest CT angiogram. FINDINGS: Cardiovascular: The study is moderate quality for the evaluation of pulmonary embolism, limited by motion degradation. There are no convincing filling defects in the central, lobar, segmental or subsegmental pulmonary artery branches to suggest acute pulmonary embolism. Mildly atherosclerotic nonaneurysmal thoracic aorta. Normal caliber pulmonary arteries. Normal heart size. No significant pericardial fluid/thickening. Mediastinum/Nodes: No discrete thyroid nodules. Unremarkable esophagus. No pathologically enlarged axillary, mediastinal or hilar lymph nodes. Lungs/Pleura: No pneumothorax. No pleural effusion. Moderate  centrilobular emphysema with diffuse bronchial wall thickening. No acute consolidative airspace disease or lung masses. Tiny calcified peripheral right upper lobe  granuloma is stable. Tiny solid posterior right lower lobe pulmonary nodules, largest 3 mm (series 10/image 67), all stable since 03/13/2015 chest CT, considered benign. No new significant pulmonary nodules. Upper abdomen: No acute abnormality. Musculoskeletal: No aggressive appearing focal osseous lesions. Mild thoracic spondylosis. Review of the MIP images confirms the above findings. IMPRESSION: 1. Motion degraded scan.  No evidence of pulmonary embolism. 2. Moderate centrilobular emphysema with diffuse bronchial wall thickening, suggesting COPD. No acute pulmonary disease. Aortic Atherosclerosis (ICD10-I70.0) and Emphysema (ICD10-J43.9). Electronically Signed   By: Delbert Phenix M.D.   On: 04/23/2018 22:01    Procedures Procedures (including critical care time)  Medications Ordered in ED Medications  sodium chloride (PF) 0.9 % injection (has no administration in time range)  iopamidol (ISOVUE-370) 76 % injection (has no administration in time range)  iopamidol (ISOVUE-370) 76 % injection 100 mL (100 mLs Intravenous Contrast Given 04/23/18 2133)     Initial Impression / Assessment and Plan / ED Course  I have reviewed the triage vital signs and the nursing notes.  Pertinent labs & imaging results that were available during my care of the patient were reviewed by me and considered in my medical decision making (see chart for details).     Patient is overall well-appearing.  Nonspecific EKG changes.  Troponin is negative.  Pain in his chest has been constant for 5 days.  CT angiogram demonstrates no evidence of pulmonary embolism or infiltrate.  This is likely musculoskeletal anterior chest wall pain.  Doubt ACS.  Doubt PE.  Doubt dissection.  Home with bronchodilators and steroids.  Pulmonary follow-up.  No significant wheezing on  examination.  No new oxygen requirement.  Final Clinical Impressions(s) / ED Diagnoses   Final diagnoses:  Precordial chest pain    ED Discharge Orders         Ordered    predniSONE (DELTASONE) 20 MG tablet  Daily     04/23/18 2302           Azalia Bilis, MD 04/23/18 2310

## 2018-04-23 NOTE — ED Triage Notes (Signed)
Pt complains of left sided chest pain x 5 days. Pt has hx of emphysema.

## 2018-04-24 DIAGNOSIS — R0902 Hypoxemia: Secondary | ICD-10-CM | POA: Diagnosis not present

## 2018-04-24 DIAGNOSIS — J432 Centrilobular emphysema: Secondary | ICD-10-CM | POA: Diagnosis not present

## 2018-05-01 ENCOUNTER — Telehealth: Payer: Self-pay | Admitting: Nurse Practitioner

## 2018-05-01 NOTE — Telephone Encounter (Signed)
Called and spoke with patient, he stated that he no longer wants to be with [facility] due to them saying he needs to send them money for his oxygen. Patient stated that he pays [name] Allegra 25$ a month through money order and he will not pay anymore than that. Patient is requesting to transfer to [facility]. Advised patient that I will send a message over and contact him with the response. Patient got upset that I would not send referral over. Advised patient per protocol I had to send a message to the physician first. Patient stated that he would come by the office tomorrow morning to speak with someone. [place] please advise, thank you.

## 2018-05-02 NOTE — Telephone Encounter (Signed)
Called and spoke with patient, advised him of response above. Patient stated that he will call [facility] and get the balance paid off and call us back once this has been done. Nothing further needed.

## 2018-05-02 NOTE — Telephone Encounter (Signed)
It would not be necessary for the patient to come to the clinic to talk to someone about this. A referral can be sent to [facility] to get oxygen. However, he does need to talk directly to [facility] to get any financial issues resolved as that is not something we would be able to address on his behalf. His changing oxygen providers will not change any balance he may still owe to [place].

## 2018-05-07 ENCOUNTER — Telehealth: Payer: Self-pay

## 2018-05-07 NOTE — Telephone Encounter (Signed)
Copied from CRM [id number]. Topic: Referral - Request for Referral >> May 07, 2018  9:55 AM Jolayne Haines L wrote: Patient called and stated he would like Dr Lawerance Bach to refer him to a heart doctor. I asked the patient why was the reason and had Dr Lawerance Bach seen him before regarding this. He said that he was in the ER on 12/30 for chest pain and they did not find anything wrong with him. I advised patient that I would let Dr Lawerance Bach know and that he may have to come in for an appt with her. Patient said that he is not coming in for an appt and hung up the phone. Please advise

## 2018-05-07 NOTE — Telephone Encounter (Signed)
I have not seen him in almost one year and ideally he should follow up.  I reviewed the hospital notes and they ruled out a heart cause.  I can still refer him to cardiology, but I did refer him to cardiology one year ago and he did not schedule an appointment.   The pain was thought to be musculoskeletal in nature.    Let me know if he still wants to see cardiology

## 2018-05-08 ENCOUNTER — Encounter: Payer: Self-pay | Admitting: Internal Medicine

## 2018-05-08 ENCOUNTER — Ambulatory Visit: Payer: Medicare Other | Admitting: Internal Medicine

## 2018-05-08 VITALS — BP 132/72 | HR 79 | Ht 65.0 in | Wt 169.0 lb

## 2018-05-08 DIAGNOSIS — I10 Essential (primary) hypertension: Secondary | ICD-10-CM

## 2018-05-08 DIAGNOSIS — E785 Hyperlipidemia, unspecified: Secondary | ICD-10-CM

## 2018-05-08 DIAGNOSIS — R071 Chest pain on breathing: Secondary | ICD-10-CM | POA: Diagnosis not present

## 2018-05-08 NOTE — Telephone Encounter (Signed)
Spoke with pt and he advised that his past cardiologist that he saw in 2017 is going to see him and he did not need a referral.

## 2018-05-08 NOTE — Progress Notes (Signed)
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 He presents today to establish cardiology care. He reports been having some chest discomfort in the left axillary area for about 4-5 months and actually had similar pain about a year ago. He [facility] by Dr. Tresa EndoKelly which indicated no obstructive coronary disease. 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 Kriste BasqueNadel and a history of tobacco abuse and polysubstance use including crack cocaine, but is been clean since 2014. 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 His biggest complaint now is difficulty sleeping at night. He does not feel like he gets a restful night sleep. [facility] Rem[facility]. He reports compliance with his CPAP. Blood pressure initially is 160/82 but came down to 130/80. He is on atorvastatin for dyslipidemia and takes daily aspirin.  05/08/2018  Gerald Williams is seen today in follow-up.  He was recently seen in the ER for chest pain.  This is felt to be pleuritic and possibly musculoskeletal.  He is required oxygen for COPD.  I have a reminder he had a heart catheterization in 2016 which showed no obstructive coronary disease.  He did rule out for MI.  Since then he continues to have intermittent episodes of pleuritic type chest pain.  He also reports some peripheral neuropathy.  He stopped a number of medications due to concerns of side effects, including a statin medication although his LDL was greater than 130.  He was previously on  gabapentin apparently and had side effects with that.  PMHx:  Past Medical History:  Diagnosis Date  . Emphysema   . Erectile dysfunction   . Hyperlipemia   . Hypertension   . Polysubstance abuse (HCC)    stopped using crack/cocaine December 4th 20-14  . PTSD (post-traumatic stress disorder)   . Sleep apnea     Past Surgical History:  Procedure Laterality Date  . CARDIAC CATHETERIZATION    . LEFT HEART CATHETERIZATION WITH CORONARY ANGIOGRAM N/A 07/15/2014   Procedure: LEFT HEART CATHETERIZATION WITH CORONARY ANGIOGRAM;  Surgeon: Lennette Biharihomas A Kelly, MD;  [facility];  Service: Cardiovascular;  Laterality: N/A;    FAMHx:  Family History  Problem Relation Age of Onset  . Diabetes Mother        Deceased  . Heart disease Father        Deceased  . Heart attack Father        x5  . Cancer - Other Sister   . Healthy Son   . Healthy Daughter   . Heart attack Brother        Deceased  . Alcohol abuse Brother        Deceased    SOCHx:   reports that he quit smoking about 4 years ago. His smoking use included cigarettes. He started smoking about 55 years ago. He has a 75.00 pack-year smoking history. He has never used smokeless tobacco. He reports that he does not drink alcohol or use drugs.  ALLERGIES:  No Known Allergies  ROS: Pertinent items noted in HPI and remainder of comprehensive ROS otherwise negative.  HOME MEDS: Current Outpatient Medications  Medication Sig Dispense Refill  . acetaminophen (TYLENOL) 325 MG tablet Take 650 mg by mouth every 6 (six) hours as needed for moderate pain.     Marland Kitchen albuterol (PROVENTIL HFA;VENTOLIN HFA) 108 (90 BASE) MCG/ACT inhaler Inhale 2 puffs into the lungs every 6 (six) hours as needed for shortness of breath.    Marland Kitchen albuterol (PROVENTIL) (2.5 MG/3ML) 0.083% nebulizer solution Take 2.5 mg by nebulization 2 (two) times daily.     Marland Kitchen albuterol-ipratropium (COMBIVENT) 18-103 MCG/ACT inhaler Inhale 1 puff into the lungs every 6 (six)  hours as needed for wheezing. 1 Inhaler 5  . aspirin 81 MG tablet Take 81 mg by mouth daily.    . budesonide-formoterol (SYMBICORT) 160-4.5 MCG/ACT inhaler Inhale 2 puffs into the lungs 2 (two) times daily. 1 Inhaler 5  . budesonide-formoterol (SYMBICORT) 160-4.5 MCG/ACT inhaler Inhale 2 puffs into the lungs 2 (two) times daily. 1 Inhaler 0  . finasteride (PROSCAR) 5 MG tablet Take 5 mg by mouth daily.    Marland Kitchen glucose blood (COOL BLOOD GLUCOSE TEST STRIPS) test strip Used to test blood sugar x2 daily---diagnosis code r73.03--for one touch verio flex 200 each 3  . ipratropium (ATROVENT) 0.02 % nebulizer solution Take 0.5 mg by nebulization 3 (three) times daily.    . Lancets MISC Used to test blood sugar x2 daily---diagnosis code r73.03--for one touch verio flex 200 each 3  . lisinopril (PRINIVIL,ZESTRIL) 40 MG tablet Take 40 mg by mouth daily.    . mirtazapine (REMERON) 15 MG tablet Take 15 mg by mouth 2 (two) times daily.     . NON FORMULARY at bedtime. CPAP - followed at [facility]    . nortriptyline (PAMELOR) 10 MG capsule Take 1 capsule (10 mg total) by mouth at bedtime. 30 capsule 5  . Polyethyl Glycol-Propyl Glycol 0.4-0.3 % SOLN Apply 2 drops to eye as needed. 15 mL 0  . valsartan (DIOVAN) 80 MG tablet Take 1 tablet (80 mg total) by mouth daily. 30 tablet 3  . vardenafil (LEVITRA) 20 MG tablet Take 20 mg by mouth daily as needed for erectile dysfunction.     No current facility-administered medications for this visit.     LABS/IMAGING: No results found for this or any previous visit (from the past 48 hour(s)). No results found.  WEIGHTS: Wt Readings from Last 3 Encounters:  05/08/18 169 lb (76.7 kg)  04/23/18 161 lb (73 kg)  04/09/18 170 lb 12.8 oz (77.5 kg)    VITALS: BP 132/72 (BP Location: Left Arm, Patient Position: Sitting, Cuff Size: Normal)   Pulse 79   Ht 5\' 5"  (1.651 m)   Wt 169 lb (76.7 kg)   BMI 28.12 kg/m   EXAM: General appearance: alert and no distress Neck: no  carotid bruit, no JVD and thyroid not enlarged, symmetric, no tenderness/mass/nodules Lungs: clear to auscultation bilaterally Heart: regular rate and rhythm, S1, S2 normal, no murmur, click, rub or gallop Abdomen: soft, non-tender; bowel sounds normal; no masses,  no organomegaly Extremities: extremities normal, atraumatic, no cyanosis or edema Pulses: 2+ and symmetric Skin: Skin color, texture, turgor normal. No rashes or lesions Neurologic: Grossly normal Psych: Pleasant  EKG: Deferred  ASSESSMENT: 1. Persistent chest pain-normal coronary arteries by cardiac catheterization 06/2014 2. Hypertension-controlled 3. Dyslipidemia 4. COPD 5. OSA on CPAP 6. PTSD 7. History polysubstance abuse  PLAN: 1.  Mr. Estep was recently in the ER with what sound like atypical chest pain.  It was mostly pleuritic and positional.  He did have normal coronaries by cath in 2016.  His dyslipidemia was not at target and he was previously on Lipitor however discontinued that due to side effects.  I would like a repeat lipid profile.  We may need to consider an alternative therapy.  He is on oxygen intermittently.  His blood pressure has been well controlled.  Plan follow-up with me annually or sooner as necessary.  Chrystie Nose, MD, [facility], FACP  [place]  [facility]  Medical Director of the Advanced Lipid Disorders &  Cardiovascular Risk Reduction Clinic Diplomate of the American Board of Clinical Lipidology Attending Cardiologist  Direct Dial: [phone]  Fax: [phone]  Website:  [email or web address] 05/08/2018, 2:26 PM

## 2018-05-08 NOTE — Patient Instructions (Signed)
Medication Instructions:  Continue current medications If you need a refill on your cardiac medications before your next appointment, please call your pharmacy.   Labs: Your physician recommends that you return for lab work FASTING to check cholesterol  Follow-Up: At [facility], you and your health needs are our priority.  As part of our continuing mission to provide you with exceptional heart care, we have created designated Provider Care Teams.  These Care Teams include your primary Cardiologist (physician) and Advanced Practice Providers (APPs -  Physician Assistants and Nurse Practitioners) who all work together to provide you with the care you need, when you need it. You will need a follow up appointment in 12 months.  Please call our office 2 months in advance to schedule this appointment.  You may see Dr. Rennis Golden or one of the following Advanced Practice Providers on your designated Care Team: Azalee Course, [place] . Micah Flesher, PA-C  Any Other Special Instructions Will Be Listed Below (If Applicable).

## 2018-05-16 DIAGNOSIS — J432 Centrilobular emphysema: Secondary | ICD-10-CM | POA: Diagnosis not present

## 2018-05-16 DIAGNOSIS — R0902 Hypoxemia: Secondary | ICD-10-CM | POA: Diagnosis not present

## 2018-05-25 DIAGNOSIS — J432 Centrilobular emphysema: Secondary | ICD-10-CM | POA: Diagnosis not present

## 2018-05-25 DIAGNOSIS — R0902 Hypoxemia: Secondary | ICD-10-CM | POA: Diagnosis not present

## 2018-06-06 ENCOUNTER — Telehealth: Payer: Self-pay | Admitting: Nurse Practitioner

## 2018-06-06 MED ORDER — BUDESONIDE-FORMOTEROL FUMARATE 160-4.5 MCG/ACT IN AERO: 2.0000 | INHALATION_SPRAY | Freq: Two times a day (BID) | RESPIRATORY_TRACT | 0 refills | Status: DC

## 2018-06-06 NOTE — Telephone Encounter (Signed)
Called and spoke with Patient.  He stated that [facility] may [facility].  He requested 1 sample till his prescription came in. Symbicort 160 sample placed at front desk for pick up.  Nothing further at this time.  Per Angus Seller, NP, 04/09/18 [facility] including: NEBULIZER w/ Albuterol &Ipratropium 3 times daily... SYMBICORT 160- 2 sprays twice daily COMBIVENT rescue inhaler as needed.Marland KitchenMarland Kitchen

## 2018-06-16 DIAGNOSIS — J432 Centrilobular emphysema: Secondary | ICD-10-CM | POA: Diagnosis not present

## 2018-06-16 DIAGNOSIS — R0902 Hypoxemia: Secondary | ICD-10-CM | POA: Diagnosis not present

## 2018-06-23 DIAGNOSIS — J432 Centrilobular emphysema: Secondary | ICD-10-CM | POA: Diagnosis not present

## 2018-06-23 DIAGNOSIS — R0902 Hypoxemia: Secondary | ICD-10-CM | POA: Diagnosis not present

## 2018-07-15 DIAGNOSIS — R0902 Hypoxemia: Secondary | ICD-10-CM | POA: Diagnosis not present

## 2018-07-15 DIAGNOSIS — J432 Centrilobular emphysema: Secondary | ICD-10-CM | POA: Diagnosis not present

## 2018-07-16 ENCOUNTER — Telehealth: Payer: Self-pay | Admitting: *Deleted

## 2018-07-16 NOTE — Telephone Encounter (Signed)
Called patient and LVM explaining that nurse would like to discuss perhaps doing a virtual AWV or re-schedule his upcoming AWV further out for patient's safety due to covid-19 safety precautions. Nurse requested that patient call her back and she also stated she would try to call patient back at a later date.

## 2018-07-17 NOTE — Telephone Encounter (Signed)
Called and LVM for both patient and patient's wife that nurse will need to either change his AWV scheduled for tomorrow to a virtual visit or re-schedule the appointment date further out for covid-19 safety measures. Nurse requested that the patient call her back and nurse's contact number was provided.

## 2018-07-18 ENCOUNTER — Ambulatory Visit: Payer: Medicare Other

## 2018-07-18 NOTE — Telephone Encounter (Signed)
Called and LVM with patient, spouse and sister that patient's AWV scheduled for today will be cancelled due safety measures regarding covid-19. Nurse requested that patient call her back to confirm he received her VMs and nurse's contact number was provided.

## 2018-07-24 DIAGNOSIS — R0902 Hypoxemia: Secondary | ICD-10-CM | POA: Diagnosis not present

## 2018-07-24 DIAGNOSIS — J432 Centrilobular emphysema: Secondary | ICD-10-CM | POA: Diagnosis not present

## 2018-08-15 DIAGNOSIS — J432 Centrilobular emphysema: Secondary | ICD-10-CM | POA: Diagnosis not present

## 2018-08-15 DIAGNOSIS — R0902 Hypoxemia: Secondary | ICD-10-CM | POA: Diagnosis not present

## 2018-08-20 ENCOUNTER — Telehealth: Payer: Self-pay | Admitting: Nurse Practitioner

## 2018-08-20 MED ORDER — BUDESONIDE-FORMOTEROL FUMARATE 160-4.5 MCG/ACT IN AERO: 2.0000 | INHALATION_SPRAY | Freq: Two times a day (BID) | RESPIRATORY_TRACT | 0 refills | Status: DC

## 2018-08-20 NOTE — Telephone Encounter (Signed)
Patient called and he states the [facility] is backed [facility] on [facility] due to a patient with Covid so medications will not be sent out for patient until May 4. I placed 2 samples up front for patient to pick up and save incase this happens again. Patient states he is on his way over here now to get samples he has been out of medication for a week.   Nothing further needed at this time.

## 2018-08-23 DIAGNOSIS — R0902 Hypoxemia: Secondary | ICD-10-CM | POA: Diagnosis not present

## 2018-08-23 DIAGNOSIS — J432 Centrilobular emphysema: Secondary | ICD-10-CM | POA: Diagnosis not present

## 2018-09-14 ENCOUNTER — Telehealth: Payer: Self-pay

## 2018-09-14 DIAGNOSIS — R0902 Hypoxemia: Secondary | ICD-10-CM | POA: Diagnosis not present

## 2018-09-14 DIAGNOSIS — J432 Centrilobular emphysema: Secondary | ICD-10-CM | POA: Diagnosis not present

## 2018-09-14 NOTE — Telephone Encounter (Signed)
Spoke with pt, he is not breathing well and thinks his lungs are getting worse. He is using his POC pulse dose and he is having to go up to 5L. I advised him that he should come in for an OV to evaluate how much oxygen he is needing. He agreed and is coming in to See Beth on Tuesday 5/26 at 4:00pm. Nothing further is needed.

## 2018-09-18 ENCOUNTER — Ambulatory Visit (INDEPENDENT_AMBULATORY_CARE_PROVIDER_SITE_OTHER): Payer: Medicare Other | Admitting: Primary Care

## 2018-09-18 ENCOUNTER — Other Ambulatory Visit: Payer: Self-pay

## 2018-09-18 ENCOUNTER — Encounter: Payer: Self-pay | Admitting: Primary Care

## 2018-09-18 DIAGNOSIS — J9611 Chronic respiratory failure with hypoxia: Secondary | ICD-10-CM | POA: Insufficient documentation

## 2018-09-18 NOTE — Progress Notes (Signed)
 @Patient  ID: Gerald Williams, male    DOB: November 16, 1950, 68 y.o.   MRN: [id number]  No chief complaint on file.   Referring provider: Pincus SanesBurns, Stacy J, MD  HPI: 68 year old male, former smoker. PMH significant for COPD with emphysema, OSA on CPAP. Former patient of Dr. Kriste BasqueNadel, last seen by pulmonary NP on 04/09/18.   09/18/2018 Patient presents today for an acute visit with complaints of increased shortness of breath and wheezing with activity. Symptoms have come on gradually. States his breathing is worse during bad weather, including heavy rain and humidity. Doesn't think Symbicort has been working for him. Using his POC on 5L. Portable oxygen canister has been running very hot and making noises. Observed in offive by nurse. He has been to pulmonary rehab before. Wants to lose weight and start being more active. He has some questions about how much oxygen he should be using. Denies fever or productive cough.    No Known Allergies  Immunization History  Administered Date(s) Administered  . Influenza Split 01/18/2018  . Influenza,inj,Quad PF,6+ Mos 01/11/2016  . Influenza-Unspecified 01/03/2015, 12/24/2016    Past Medical History:  Diagnosis Date  . Emphysema   . Erectile dysfunction   . Hyperlipemia   . Hypertension   . Polysubstance abuse (HCC)    stopped using crack/cocaine December 4th 20-14  . PTSD (post-traumatic stress disorder)   . Sleep apnea     Tobacco History: Social History   Tobacco Use  Smoking Status Former Smoker  . Packs/day: 1.50  . Years: 50.00  . Pack years: 75.00  . Types: Cigarettes  . Start date: 04/26/1963  . Last attempt to quit: 09/22/2013  . Years since quitting: 4.9  Smokeless Tobacco Never Used  Tobacco Comment   patient using the patch, losenges, and medication   Counseling given: Not Answered Comment: patient using the patch, losenges, and medication   Outpatient Medications Prior to Visit  Medication Sig Dispense Refill  .  acetaminophen (TYLENOL) 325 MG tablet Take 650 mg by mouth every 6 (six) hours as needed for moderate pain.     Marland Kitchen. albuterol (PROVENTIL HFA;VENTOLIN HFA) 108 (90 BASE) MCG/ACT inhaler Inhale 2 puffs into the lungs every 6 (six) hours as needed for shortness of breath.    Marland Kitchen. albuterol (PROVENTIL) (2.5 MG/3ML) 0.083% nebulizer solution Take 2.5 mg by nebulization 2 (two) times daily.     Marland Kitchen. aspirin 81 MG tablet Take 81 mg by mouth daily.    . finasteride (PROSCAR) 5 MG tablet Take 5 mg by mouth daily.    Marland Kitchen. glucose blood (COOL BLOOD GLUCOSE TEST STRIPS) test strip Used to test blood sugar x2 daily---diagnosis code r73.03--for one touch verio flex 200 each 3  . Lancets MISC Used to test blood sugar x2 daily---diagnosis code r73.03--for one touch verio flex 200 each 3  . lisinopril (PRINIVIL,ZESTRIL) 40 MG tablet Take 40 mg by mouth daily.    . mirtazapine (REMERON) 15 MG tablet Take 15 mg by mouth 2 (two) times daily.     . NON FORMULARY at bedtime. [facility]    . nortriptyline (PAMELOR) 10 MG capsule Take 1 capsule (10 mg total) by mouth at bedtime. 30 capsule 5  . Polyethyl Glycol-Propyl Glycol 0.4-0.3 % SOLN Apply 2 drops to eye as needed. 15 mL 0  . valsartan (DIOVAN) 80 MG tablet Take 1 tablet (80 mg total) by mouth daily. 30 tablet 3  . vardenafil (LEVITRA) 20 MG tablet Take 20  mg by mouth daily as needed for erectile dysfunction.    Marland Kitchen albuterol-ipratropium (COMBIVENT) 18-103 MCG/ACT inhaler Inhale 1 puff into the lungs every 6 (six) hours as needed for wheezing. 1 Inhaler 5  . budesonide-formoterol (SYMBICORT) 160-4.5 MCG/ACT inhaler Inhale 2 puffs into the lungs 2 (two) times daily. 1 Inhaler 5  . budesonide-formoterol (SYMBICORT) 160-4.5 MCG/ACT inhaler Inhale 2 puffs into the lungs 2 (two) times daily. 1 Inhaler 0  . budesonide-formoterol (SYMBICORT) 160-4.5 MCG/ACT inhaler Inhale 2 puffs into the lungs 2 (two) times daily. 2 Inhaler 0  . ipratropium (ATROVENT) 0.02 % nebulizer  solution Take 0.5 mg by nebulization 3 (three) times daily.    . budesonide-formoterol (SYMBICORT) 160-4.5 MCG/ACT inhaler Inhale 2 puffs into the lungs 2 (two) times daily. 1 Inhaler 0   No facility-administered medications prior to visit.     Review of Systems  Review of Systems  HENT: Negative.   Respiratory: Positive for shortness of breath and wheezing. Negative for cough.   Cardiovascular: Negative.     Physical Exam  BP 122/80 (BP Location: Left Arm, Cuff Size: Normal)   Pulse 82   Temp 98.3 F (36.8 C)   Ht 5\' 5"  (1.651 m)   Wt 164 lb (74.4 kg)   SpO2 96%   BMI 27.29 kg/m  Physical Exam Constitutional:      Appearance: Normal appearance.  HENT:     Mouth/Throat:     Mouth: Mucous membranes are moist.     Pharynx: Oropharynx is clear.  Eyes:     Extraocular Movements: Extraocular movements intact.     Pupils: Pupils are equal, round, and reactive to light.  Neck:     Musculoskeletal: Normal range of motion and neck supple.  Cardiovascular:     Rate and Rhythm: Normal rate and regular rhythm.  Pulmonary:     Effort: Pulmonary effort is normal.     Breath sounds: Normal breath sounds. No wheezing or rhonchi.     Comments: CTA, diminished  Musculoskeletal: Normal range of motion.  Skin:    General: Skin is warm and dry.  Neurological:     General: No focal deficit present.     Mental Status: He is alert and oriented to person, place, and time. Mental status is at baseline.  Psychiatric:        Mood and Affect: Mood normal.        Behavior: Behavior normal.        Thought Content: Thought content normal.        Judgment: Judgment normal.      Lab Results:  CBC    Component Value Date/Time   WBC 5.5 04/23/2018 1742   RBC 4.43 04/23/2018 1742   HGB 13.6 04/23/2018 1742   HCT 42.0 04/23/2018 1742   PLT 202 04/23/2018 1742   MCV 94.8 04/23/2018 1742   MCH 30.7 04/23/2018 1742   MCHC 32.4 04/23/2018 1742   RDW 14.2 04/23/2018 1742   LYMPHSABS 1.5  10/05/2015 1205   MONOABS 0.4 10/05/2015 1205   EOSABS 0.1 10/05/2015 1205   BASOSABS 0.0 10/05/2015 1205    BMET    Component Value Date/Time   NA 143 04/23/2018 1742   K 4.0 04/23/2018 1742   CL 108 04/23/2018 1742   CO2 28 04/23/2018 1742   GLUCOSE 119 (H) 04/23/2018 1742   BUN 16 04/23/2018 1742   CREATININE 1.29 (H) 04/23/2018 1742   CALCIUM 9.1 04/23/2018 1742   GFRNONAA 57 (L) 04/23/2018 1742  GFRAA >60 04/23/2018 1742    BNP No results found for: BNP  ProBNP    Component Value Date/Time   PROBNP <30.0 05/26/2010 1120    Imaging: No results found.   Assessment & Plan:   COPD (chronic obstructive pulmonary disease) with emphysema (HCC) - Start Trelegy- take 1 puff daily (samples given) - Continue albuterol 2 puffs every 4-6 hours for breakthrough shortness of breath/wheezing - Encouraged patient to stay active   Chronic respiratory failure with hypoxia (HCC) - Use 2L continuous oxygen on exertion - Use 2-3L on POC on exertion   FU with new pulmonary provider in 2-4 months or sooner if needed  Glenford Bayley, NP 09/18/2018

## 2018-09-18 NOTE — Assessment & Plan Note (Addendum)
-   Use 2L continuous oxygen on exertion - Use 2-3L on POC on exertion

## 2018-09-18 NOTE — Patient Instructions (Addendum)
Stop Symbicort  Stop combivent  COPD:  Start Trelegy- take 1 puff daily (samples) Continue albuterol 2 puffs every 4-6 hours for breakthrough shortness of breath/wheezing  Oxygen requirements: Use 2L continuous oxygen on exertion Use 2-3L on pulsed oxygen canister on exertion   Follow-up: Needs apt with new pulmonary provider in 2-4 months (former nadel patient) or sooner if needed

## 2018-09-18 NOTE — Assessment & Plan Note (Signed)
-   Start Trelegy- take 1 puff daily (samples given) - Continue albuterol 2 puffs every 4-6 hours for breakthrough shortness of breath/wheezing - Encouraged patient to stay active

## 2018-09-20 MED ORDER — VANCOMYCIN HCL 1000 MG IV SOLR
INTRAVENOUS | Status: AC
  Filled 2018-09-20: qty 1000

## 2018-09-23 DIAGNOSIS — J432 Centrilobular emphysema: Secondary | ICD-10-CM | POA: Diagnosis not present

## 2018-09-23 DIAGNOSIS — R0902 Hypoxemia: Secondary | ICD-10-CM | POA: Diagnosis not present

## 2018-10-01 ENCOUNTER — Telehealth: Payer: Self-pay | Admitting: Internal Medicine

## 2018-10-01 DIAGNOSIS — J432 Centrilobular emphysema: Secondary | ICD-10-CM

## 2018-10-01 DIAGNOSIS — J9611 Chronic respiratory failure with hypoxia: Secondary | ICD-10-CM

## 2018-10-01 NOTE — Telephone Encounter (Signed)
Instructions  Return in about 3 months (around 12/19/2018).  Stop Symbicort  Stop combivent  COPD:  Start Trelegy- take 1 puff daily (samples) Continue albuterol 2 puffs every 4-6 hours for breakthrough shortness of breath/wheezing  Oxygen requirements: Use 2L continuous oxygen on exertion Use 2-3L on pulsed oxygen canister on exertion   Follow-up: Needs apt with new pulmonary provider in 2-4 months (former nadel patient) or sooner if needed      Called and spoke with pt who stated he is needing to have a small oxygen tank with continuous flow sent to DME. Pt stated when he was in pulmonary rehab, was on continuous O2 while exercising.  Pt needs to have small tank for continuous flow as the pulsed flow canister is not working for him. Stated to pt that I would send order to [facility] for him with this info and pt verbalized understanding. Order has been placed and sent to [facility].nothing further needed.

## 2018-10-15 DIAGNOSIS — R0902 Hypoxemia: Secondary | ICD-10-CM | POA: Diagnosis not present

## 2018-10-15 DIAGNOSIS — J432 Centrilobular emphysema: Secondary | ICD-10-CM | POA: Diagnosis not present

## 2018-10-23 DIAGNOSIS — R0902 Hypoxemia: Secondary | ICD-10-CM | POA: Diagnosis not present

## 2018-10-23 DIAGNOSIS — J432 Centrilobular emphysema: Secondary | ICD-10-CM | POA: Diagnosis not present

## 2018-10-24 ENCOUNTER — Other Ambulatory Visit: Payer: Self-pay

## 2018-10-24 ENCOUNTER — Telehealth: Payer: Self-pay | Admitting: Internal Medicine

## 2018-10-24 ENCOUNTER — Emergency Department (HOSPITAL_COMMUNITY): Payer: No Typology Code available for payment source

## 2018-10-24 ENCOUNTER — Other Ambulatory Visit: Payer: Self-pay | Admitting: Physician Assistant

## 2018-10-24 ENCOUNTER — Emergency Department (HOSPITAL_COMMUNITY)
Admission: EM | Admit: 2018-10-24 | Discharge: 2018-10-24 | Disposition: A | Discharge status: Home / Self Care | Payer: No Typology Code available for payment source | Attending: Emergency Medicine | Admitting: Emergency Medicine

## 2018-10-24 DIAGNOSIS — R0789 Other chest pain: Secondary | ICD-10-CM | POA: Diagnosis present

## 2018-10-24 DIAGNOSIS — Z79899 Other long term (current) drug therapy: Secondary | ICD-10-CM | POA: Insufficient documentation

## 2018-10-24 DIAGNOSIS — Z87891 Personal history of nicotine dependence: Secondary | ICD-10-CM | POA: Insufficient documentation

## 2018-10-24 DIAGNOSIS — J449 Chronic obstructive pulmonary disease, unspecified: Secondary | ICD-10-CM | POA: Insufficient documentation

## 2018-10-24 DIAGNOSIS — I10 Essential (primary) hypertension: Secondary | ICD-10-CM | POA: Insufficient documentation

## 2018-10-24 DIAGNOSIS — Z7982 Long term (current) use of aspirin: Secondary | ICD-10-CM | POA: Insufficient documentation

## 2018-10-24 DIAGNOSIS — M79601 Pain in right arm: Secondary | ICD-10-CM | POA: Insufficient documentation

## 2018-10-24 DIAGNOSIS — R0602 Shortness of breath: Secondary | ICD-10-CM | POA: Insufficient documentation

## 2018-10-24 DIAGNOSIS — I252 Old myocardial infarction: Secondary | ICD-10-CM | POA: Diagnosis not present

## 2018-10-24 DIAGNOSIS — R079 Chest pain, unspecified: Secondary | ICD-10-CM

## 2018-10-24 LAB — BASIC METABOLIC PANEL
Anion gap: 9 (ref 5–15)
BUN: 14 mg/dL (ref 8–23)
CO2: 23 mmol/L (ref 22–32)
Calcium: 9.2 mg/dL (ref 8.9–10.3)
Chloride: 109 mmol/L (ref 98–111)
Creatinine, Ser: 1.29 mg/dL — ABNORMAL HIGH (ref 0.61–1.24)
GFR calc Af Amer: >60 mL/min (ref >60)
GFR calc non Af Amer: 57 mL/min — ABNORMAL LOW (ref >60)
Glucose, Bld: 101 mg/dL — ABNORMAL HIGH (ref 70–99)
Potassium: 4.1 mmol/L (ref 3.5–5.1)
Sodium: 141 mmol/L (ref 135–145)

## 2018-10-24 LAB — TROPONIN I (HIGH SENSITIVITY)
Troponin I (High Sensitivity): 6 ng/L (ref <18)
Troponin I (High Sensitivity): 7 ng/L (ref <18)

## 2018-10-24 LAB — D-DIMER, QUANTITATIVE: D-Dimer, Quant: 0.57 ug/mL-FEU — ABNORMAL HIGH (ref 0.00–0.50)

## 2018-10-24 LAB — CBC
HCT: 39.7 % (ref 39.0–52.0)
Hemoglobin: 13.1 g/dL (ref 13.0–17.0)
MCH: 30.3 pg (ref 26.0–34.0)
MCHC: 33 g/dL (ref 30.0–36.0)
MCV: 91.9 fL (ref 80.0–100.0)
Platelets: 202 10*3/uL (ref 150–400)
RBC: 4.32 MIL/uL (ref 4.22–5.81)
RDW: 13.7 % (ref 11.5–15.5)
WBC: 4 10*3/uL (ref 4.0–10.5)
nRBC: 0 % (ref 0.0–0.2)

## 2018-10-24 LAB — CBG MONITORING, ED: Glucose-Capillary: 97 mg/dL (ref 70–99)

## 2018-10-24 NOTE — Telephone Encounter (Signed)
Received call from patient he stated for the past 2 days he has been having chest pain.Stated he has been sob different from his emphysema.Stated he is having chest pain at present rates pain a # 7.Advised to go to [facility] ED.Trish notified.

## 2018-10-24 NOTE — ED Provider Notes (Signed)
[facility] Provider Note   CSN: [id number] Arrival date & time: 10/24/18  [id number]    History   Chief Complaint Chief Complaint  Patient presents with  . Chest Pain    HPI Al DecantJoseph Kobler is a 68 y.o. male with PMHx HTN, HLD, chronic respiratory failure with hypoxia on 2L O2 at home, COPD, hx MI, OSA who presents to the ED today complaining of sudden onset, constant, achy, left sided chest pain that began yesterday. Pt states the pain radiates down his right arm. No worsening shortness of breath. No nausea or vomiting. Pt took "a couple of BC powders" earlier this morning with mild improvement. Denies fever, chills, leg swelling, or any other associated symptoms. No recent prolonged travel or immobilization. No hx DVT/PE. No hemoptysis.          Past Medical History:  Diagnosis Date  . Emphysema   . Erectile dysfunction   . Hyperlipemia   . Hypertension   . Polysubstance abuse (HCC)    stopped using crack/cocaine December 4th 20-14  . PTSD (post-traumatic stress disorder)   . Sleep apnea     Patient Active Problem List   Diagnosis Date Noted  . Chronic respiratory failure with hypoxia (HCC) 09/18/2018  . Exercise hypoxemia 11/07/2017  . Frequent headaches 05/23/2017  . Acute pain of right knee 12/23/2016  . 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 Blood in stool 10/05/2015  . Essential hypertension 06/18/2015  . Cephalalgia 06/18/2015  . Dyslipidemia 06/12/2015  . COPD mixed type (HCC) 05/28/2015  . Peripheral neuropathy 05/20/2015  . COPD (chronic obstructive pulmonary disease) with emphysema (HCC) 05/20/2015  . PTSD (post-traumatic stress disorder) 05/20/2015  . Right carotid bruit 05/20/2015  . Frequent urination 05/20/2015  . Hematuria 07/15/2014  . History of tobacco abuse:  quit in ~ 06/2013  07/14/2014  . OSA on CPAP 07/14/2014  . Pre-diabetes 07/14/2014  . Claudication, R>L 07/14/2014  . Cocaine use,  history of 03/26/2011  . Chest pain 03/26/2011    Past Surgical History:  Procedure Laterality Date  . CARDIAC CATHETERIZATION    . LEFT HEART CATHETERIZATION WITH CORONARY ANGIOGRAM N/A 07/15/2014   Procedure: LEFT HEART CATHETERIZATION WITH CORONARY ANGIOGRAM;  Surgeon: Lennette Biharihomas A Kelly, MD;  [facility];  Service: Cardiovascular;  Laterality: N/A;        Home Medications    Prior to Admission medications   Medication Sig Start Date End Date Taking? Authorizing Provider  acetaminophen (TYLENOL) 325 MG tablet Take 650 mg by mouth every 6 (six) hours as needed for moderate pain.     [provider]  albuterol (PROVENTIL HFA;VENTOLIN HFA) 108 (90 BASE) MCG/ACT inhaler Inhale 2 puffs into the lungs every 6 (six) hours as needed for shortness of breath.    [provider]  albuterol (PROVENTIL) (2.5 MG/3ML) 0.083% nebulizer solution Take 2.5 mg by nebulization 2 (two) times daily.     [provider]  aspirin 81 MG tablet Take 81 mg by mouth daily.    [provider]  finasteride (PROSCAR) 5 MG tablet Take 5 mg by mouth daily.    [provider]  glucose blood (COOL BLOOD GLUCOSE TEST STRIPS) test strip Used to test blood sugar x2 daily---diagnosis code r73.03--for one touch verio flex 12/08/15   Pincus SanesBurns, Stacy J, MD  Lancets MISC Used to test blood sugar x2 daily---diagnosis code r73.03--for one touch verio flex 12/08/15   Pincus SanesBurns, Stacy J, MD  lisinopril (PRINIVIL,ZESTRIL) 40 MG tablet Take 40 mg by mouth daily.    [provider]  mirtazapine (REMERON) 15 MG tablet Take 15 mg by mouth 2 (two) times daily.     [provider]  NON FORMULARY at bedtime. CPAP - followed at [facility]    [provider]  nortriptyline (PAMELOR) 10 MG capsule Take 1 capsule (10 mg total) by mouth at bedtime. 09/23/15   Nita Sickle K, DO  Polyethyl Glycol-Propyl Glycol 0.4-0.3 % SOLN Apply 2 drops to eye as needed. 08/25/16   Nche, Bonna Gains,  NP  valsartan (DIOVAN) 80 MG tablet Take 1 tablet (80 mg total) by mouth daily. 05/26/15   [name], MD  vardenafil (LEVITRA) 20 MG tablet Take 20 mg by mouth daily as needed for erectile dysfunction.    [provider]    Family History Family History  Problem Relation Age of Onset  . Diabetes Mother        Deceased  . Heart disease Father        Deceased  . Heart attack Father        x5  . Cancer - Other Sister   . Healthy Son   . Healthy Daughter   . Heart attack Brother        Deceased  . Alcohol abuse Brother        Deceased    Social History Social History   Tobacco Use  . Smoking status: Former Smoker    Packs/day: 1.50    Years: 50.00    Pack years: 75.00    Types: Cigarettes    Start date: 04/26/1963    Quit date: 09/22/2013    Years since quitting: 5.0  . Smokeless tobacco: Never Used  . Tobacco comment: patient using the patch, losenges, and medication  Substance Use Topics  . Alcohol use: No    Alcohol/week: 0.0 standard drinks    Comment: He was drinking 3-4 drinks nightly x 40 years, quit in 2014.    . Drug use: No    Comment: history of crack use and drug tested [facility]     Allergies   Patient has no known allergies.   Review of Systems Review of Systems  Constitutional: Negative for chills and fever.  HENT: Negative for congestion.   Eyes: Negative for redness.  Respiratory: Positive for shortness of breath (chronic,unchanged). Negative for cough.   Cardiovascular: Positive for chest pain.  Gastrointestinal: Negative for abdominal pain, nausea and vomiting.  Genitourinary: Negative for difficulty urinating.  Musculoskeletal: Negative for myalgias.  Skin: Negative for rash.  Neurological: Negative for headaches.     Physical Exam Updated Vital Signs BP (!) 152/84   Pulse (!) 59   Temp 98.4 F (36.9 C) (Oral)   Resp 17   Ht 5\' 5"  (1.651 m)   Wt 72.6 kg   SpO2 98%   BMI 26.63 kg/m   Physical Exam Vitals  signs and nursing note reviewed.  Constitutional:      Appearance: He is not ill-appearing.  HENT:     Head: Normocephalic and atraumatic.  Eyes:     Conjunctiva/sclera: Conjunctivae normal.  Neck:     Musculoskeletal: Neck supple.  Cardiovascular:     Rate and Rhythm: Normal rate and regular rhythm.     Pulses:          Radial pulses are 2+ on the right side and 2+ on the left side.  Dorsalis pedis pulses are 2+ on the right side and 2+ on the left side.  Pulmonary:     Effort: Pulmonary effort is normal.     Breath sounds: Normal breath sounds. No decreased breath sounds, wheezing, rhonchi or rales.  Abdominal:     Palpations: Abdomen is soft.     Tenderness: There is no abdominal tenderness.  Skin:    General: Skin is warm and dry.  Neurological:     Mental Status: He is alert.      ED Treatments / Results  Labs (all labs ordered are listed, but only abnormal results are displayed) Labs Reviewed  BASIC METABOLIC PANEL - Abnormal; Notable for the following components:      Result Value   Glucose, Bld 101 (*)    Creatinine, Ser 1.29 (*)    GFR calc non Af Amer 57 (*)    All other components within normal limits  D-DIMER, QUANTITATIVE (NOT AT [facility]) - Abnormal; Notable for the following components:   D-Dimer, Quant 0.57 (*)    All other components within normal limits  NOVEL CORONAVIRUS, NAA (HOSPITAL ORDER, SEND-OUT TO REF LAB)  TROPONIN I (HIGH SENSITIVITY)  CBC  TROPONIN I (HIGH SENSITIVITY)  CBG MONITORING, ED    EKG None  Radiology Dg Chest Portable 1 View  Result Date: 10/24/2018 CLINICAL DATA:  Chest pain EXAM: PORTABLE CHEST 1 VIEW COMPARISON:  Chest radiograph 05/18/2017 FINDINGS: Normal mediastinum and cardiac silhouette. Normal pulmonary vasculature. No evidence of effusion, infiltrate, or pneumothorax. No acute bony abnormality. IMPRESSION: No acute cardiopulmonary process. Electronically Signed   By: Suzy Bouchard M.D.   On: 10/24/2018 10:10     Procedures Procedures (including critical care time)  Medications Ordered in ED Medications - No data to display   Initial Impression / Assessment and Plan / ED Course  I have reviewed the triage vital signs and the nursing notes.  Pertinent labs & imaging results that were available during my care of the patient were reviewed by me and considered in my medical decision making (see chart for details).  68 year old male presenting with left sided chest pain radiating into right arm x 1 day. Hx of MI in the past. + FHx as well. Last stress test done in 2016 without ishemic changes. Concern for ACS at the moment; no risk factors to suggest PE besides age; cannot PERC out; will order D-dimer. EKG without ischemic changes. Aspirin held as pt took [facility] powder earlier today. Will obtain CXR, Trop, BMP, and CBC. Will reevaluate once labs and imaging return.   CXR negative. Lab work reassuring as well; initial trop of 6 and repeat of 7. Pt's heart score is 5; discussed case with cardiology Dr. Burt Knack who believes patient can be followed outpatient. He agrees with age corrected d-dimer. Will discharge patient with close follow up with Dr. Debara Pickett; Dr. Burt Knack sent message to [facility] office as well. Updated patient on plan; he is in agreement at this time and stable for discharge home.   Clinical Course as of Oct 23 1344  Wed Oct 24, 2018  1049 Acceptable with age correction  D-Dimer, Quant(!): 0.57 [MV]  1339 At baseline   Creatinine(!): 1.29 [MV]  1341 Discussed case with Dr. Burt Knack with cardiology; given patient's troponin levels of 6 and 7 he feels patient can be follow outpatient and does not need to be admitted at this time; will discharge with close follow up with Dr. Debara Pickett   [MV]  Clinical Course User Index [MV] Tanda RockersVenter, Avarie Tavano, [place]PA-C          Final Clinical Impressions(s) / ED Diagnoses   Final diagnoses:  Nonspecific chest pain    ED Discharge Orders    None       Tanda RockersVenter,  Manuella Blackson, PA-C 10/24/18 1555    Arby BarrettePfeiffer, Marcy, MD 10/25/18 [phone]

## 2018-10-24 NOTE — ED Triage Notes (Signed)
Pt POV d/t R. CP radiating to R. Arm started x1 day ago

## 2018-10-24 NOTE — Telephone Encounter (Signed)
New Message   Pt c/o of Chest Pain: STAT if CP now or developed within 24 hours  1. Are you having CP right now? yes  2. Are you experiencing any other symptoms (ex. SOB, nausea, vomiting, sweating)? Sluggish    3. How long have you been experiencing CP? Past few days   4. Is your CP continuous or coming and going? continuous  5. Have you taken Nitroglycerin? No  ?

## 2018-10-24 NOTE — Discharge Instructions (Signed)
You were seen in the ED today for chest pain; your labwork, chest xray, and EKG were reassuring today. Please follow up with Dr. Lysbeth Penner office regarding your ED visit.

## 2018-10-25 ENCOUNTER — Telehealth: Payer: Self-pay | Admitting: Internal Medicine

## 2018-10-25 NOTE — Telephone Encounter (Signed)
Called patient to schedule post ER visit per Dr. Burt Knack message.  [facility] and [facility] are [facility].

## 2018-10-29 ENCOUNTER — Telehealth: Payer: Self-pay | Admitting: Internal Medicine

## 2018-10-29 NOTE — Telephone Encounter (Signed)
LVM for patient to call and schedule ER followup with PA.

## 2018-11-14 DIAGNOSIS — R0902 Hypoxemia: Secondary | ICD-10-CM | POA: Diagnosis not present

## 2018-11-14 DIAGNOSIS — J432 Centrilobular emphysema: Secondary | ICD-10-CM | POA: Diagnosis not present

## 2018-11-19 ENCOUNTER — Encounter: Payer: Self-pay | Admitting: Internal Medicine

## 2018-11-19 ENCOUNTER — Ambulatory Visit: Payer: Medicare Other | Admitting: Internal Medicine

## 2018-11-19 ENCOUNTER — Other Ambulatory Visit: Payer: Self-pay

## 2018-11-19 DIAGNOSIS — J449 Chronic obstructive pulmonary disease, unspecified: Secondary | ICD-10-CM

## 2018-11-19 DIAGNOSIS — J9611 Chronic respiratory failure with hypoxia: Secondary | ICD-10-CM

## 2018-11-19 DIAGNOSIS — I10 Essential (primary) hypertension: Secondary | ICD-10-CM

## 2018-11-19 NOTE — Assessment & Plan Note (Signed)
ACE inhibitors are problematic in  pts with airway complaints because  even experienced pulmonologists can't always distinguish ace effects from copd/asthma.  By themselves they don't actually cause a problem, much like oxygen can't by itself start a fire, but they certainly serve as a powerful catalyst or enhancer for any "fire"  or inflammatory process in the upper airway, be it caused by an ET  tube or more commonly reflux (especially in the obese or pts with known GERD or who are on biphoshonates).    In the era of ARB near equivalency I would rec for any new symptoms of sob/ cough that are not bypical of copd that he be given a trial of high dose arb and d/c ACEi rather than seek further escalation of his pulmonary meds  see avs for instructions unique to this ov

## 2018-11-19 NOTE — Progress Notes (Signed)
Gerald Williams, male    DOB: 07/19/1950,    MRN: [id number]   Brief patient profile:  67 yobm quit smoking 08/2013 GOLD III/ group D symptoms  with  better on trelegy vs symbicort with less need for saba but concerned about cost of non-[facility] meds.       History of Present Illness  11/19/2018  Pulmonary/ 1st office eval/Wert on ACEi and ARB Chief Complaint  Patient presents with  . Follow-up    Breathing is overall doing well. He is using his albuterol inhaler 2-3 x per day and has not used his neb.   Dyspnea:  Can walk a half a block on 3lpm but does not check sats walking Cough: none  Sleep: sleeps on cpap/ 3lpm  SABA use: while on trelegy not needing much albuterol    No obvious day to day or daytime variability or assoc excess/ purulent sputum or mucus plugs or hemoptysis or cp or chest tightness, subjective wheeze or overt sinus or hb symptoms.   Sleeping as above without nocturnal  or early am exacerbation  of respiratory  c/o's or need for noct saba. Also denies any obvious fluctuation of symptoms with weather or environmental changes or other aggravating or alleviating factors except as outlined above   No unusual exposure hx or h/o childhood pna/ asthma or knowledge of premature birth.  Current Allergies, Complete Past Medical History, Past Surgical History, Family History, and Social History were reviewed in [facility] electronic medical record.  ROS  The following are not active complaints unless bolded Hoarseness, sore throat, dysphagia, dental problems, itching, sneezing,  nasal congestion or discharge of excess mucus or purulent secretions, ear ache,   fever, chills, sweats, unintended wt loss or wt gain, classically pleuritic or exertional cp,  orthopnea pnd or arm/hand swelling  or leg swelling, presyncope, palpitations, abdominal pain, anorexia, nausea, vomiting, diarrhea  or change in bowel habits or change in bladder habits, change in stools or change in urine,  dysuria, hematuria,  rash, arthralgias, visual complaints, headache, numbness, weakness or ataxia or problems with walking or coordination,  change in mood or  memory.           Past Medical History:  Diagnosis Date  . Emphysema   . Erectile dysfunction   . Hyperlipemia   . Hypertension   . Polysubstance abuse (HCC)    stopped using crack/cocaine December 4th 20-14  . PTSD (post-traumatic stress disorder)   . Sleep apnea     Outpatient Medications Prior to Visit  Medication Sig Dispense Refill  . acetaminophen (TYLENOL) 325 MG tablet Take 650 mg by mouth every 6 (six) hours as needed for moderate pain.     Marland Kitchen. albuterol (PROVENTIL HFA;VENTOLIN HFA) 108 (90 BASE) MCG/ACT inhaler Inhale 2 puffs into the lungs every 6 (six) hours as needed for shortness of breath.    Marland Kitchen. albuterol (PROVENTIL) (2.5 MG/3ML) 0.083% nebulizer solution Take 2.5 mg by nebulization 2 (two) times daily.     Marland Kitchen. aspirin 81 MG tablet Take 81 mg by mouth daily.    . finasteride (PROSCAR) 5 MG tablet Take 5 mg by mouth daily.    Marland Kitchen. glucose blood (COOL BLOOD GLUCOSE TEST STRIPS) test strip Used to test blood sugar x2 daily---diagnosis code r73.03--for one touch verio flex 200 each 3  . Lancets MISC Used to test blood sugar x2 daily---diagnosis code r73.03--for one touch verio flex 200 each 3  . lisinopril (PRINIVIL,ZESTRIL) 40 MG tablet Take 40 mg  by mouth daily.    . mirtazapine (REMERON) 15 MG tablet Take 15 mg by mouth 2 (two) times daily.     . NON FORMULARY at bedtime. CPAP - followed at [facility]    . nortriptyline (PAMELOR) 10 MG capsule Take 1 capsule (10 mg total) by mouth at bedtime. 30 capsule 5  . OXYGEN 3lpm 24/7  [employer]    . Polyethyl Glycol-Propyl Glycol 0.4-0.3 % SOLN Apply 2 drops to eye as needed. 15 mL 0  . UNABLE TO FIND Med Name: CPAP with o2 3lpm with sleep    . valsartan (DIOVAN) 80 MG tablet Take 1 tablet (80 mg total) by mouth daily. 30 tablet 3  . vardenafil (LEVITRA) 20 MG tablet Take 20 mg by mouth  daily as needed for erectile dysfunction.        Objective:     BP 124/70 (BP Location: Left Arm, Cuff Size: Normal)   Pulse 60   Temp 98 F (36.7 C) (Oral)   Ht 5' 5.5" (1.664 m)   Wt 166 lb 9.6 oz (75.6 kg)   SpO2 98% Comment: 3lpm cont o2  BMI 27.30 kg/m   SpO2: 98 %(3lpm cont o2) O2 Type: Continuous O2 O2 Flow Rate (L/min): 3 L/min   Pleasant amb bm nad  HEENT: nl dentition / oropharynx. Nl external ear canals without cough reflex -  Mild bilateral non-specific turbinate edema     NECK :  without JVD/Nodes/TM/ nl carotid upstrokes bilaterally   LUNGS: no acc muscle use,  Mod barrel  contour chest wall with bilateral  Distant bs s audible wheeze and  without cough on insp or exp maneuver and mod  Hyperresonant  to  percussion bilaterally     CV:  RRR  no s3 or murmur or increase in P2, and no edema   ABD:  soft and nontender with pos mid insp Hoover's  in the supine position. No bruits or organomegaly appreciated, bowel sounds nl  MS:     ext warm without deformities, calf tenderness, cyanosis or clubbing No obvious joint restrictions   SKIN: warm and dry without lesions    NEURO:  alert, approp, nl sensorium with  no motor or cerebellar deficits apparent.           I personally reviewed images and agree with radiology impression as follows:  pCXR:   10/24/2018 No acute cardiopulmonary process.      Assessment   COPD GOLD III Quit smoking 2015  - Spirometry 11/19/2018  FEV1 1.00 (36%)  Ratio 0.49  With no response  To saba on ? Prior to test  - - The proper method of use, as well as anticipated side effects, of an elipta inhaler were discussed and demonstrated to the patient.   Per hx  Group D in terms of symptom/risk and laba/lama/ICS  therefore appropriate rx at this point  >>>  trelegy is approp but so would be symb/ spiriva free thru the [place], it's just that they require more face to face teaching   Also, re saba use:  I spent extra time with pt  today reviewing appropriate use of albuterol for prn use on exertion with the following points: 1) saba is for relief of sob that does not improve by walking a slower pace or resting but rather if the pt does not improve after trying this first. 2) If the pt is convinced, as many are, that saba helps recover from activity faster then it's easy to tell  if this is the case by re-challenging : ie stop, take the inhaler, then p 5 minutes try the exact same activity (intensity of workload) that just caused the symptoms and see if they are substantially diminished or not after saba 3) if there is an activity that reproducibly causes the symptoms, try the saba 15 min before the activity on alternate days   If in fact the saba really does help, then fine to continue to use it prn but advised may need to look closer at the maintenance regimen being used to achieve better control of airways disease with exertion.   Pt informed of the seriousness of COVID 19 infection as a direct risk to their health  and safey and to those of their loved ones and should continue to wear facemask in public and minimize exposure to public locations but especially avoid any area or activity where non-close contacts are not observing distancing or wearing an appropriate face mask.      Chronic respiratory failure with hypoxia (HCC) As of  11/19/2018  = 3lpm 24/7   >>> Adequate control on present rx, reviewed in detail with pt > no change in rx needed      Essential hypertension ACE inhibitors are problematic in  pts with airway complaints because  even experienced pulmonologists can't always distinguish ace effects from copd/asthma.  By themselves they don't actually cause a problem, much like oxygen can't by itself start a fire, but they certainly serve as a powerful catalyst or enhancer for any "fire"  or inflammatory process in the upper airway, be it caused by an ET  tube or more commonly reflux (especially in the obese or  pts with known GERD or who are on biphoshonates).    In the era of ARB near equivalency I would rec for any new symptoms of sob/ cough that are not bypical of copd that he be given a trial of high dose arb and d/c ACEi rather than seek further escalation of his pulmonary meds  see avs for instructions unique to this ov        I had an extended discussion with the patient reviewing all relevant studies completed to date and  lasting 25 minutes of a 40  minute office visit with pt new to me    re  severe non-specific but potentially very serious refractory respiratory symptoms of uncertain and potentially multiple  Etiologies.  I performed device teaching  using a teach back technique which also  extended face to face time for this visit (see above)    Each maintenance medication was reviewed in detail including most importantly the difference between maintenance and prns and under what circumstances the prns are to be triggered using an action plan format that is not reflected in the computer generated alphabetically organized AVS.    Please see AVS for specific instructions unique to this office visit that I personally wrote and verbalized to the the pt in detail and then reviewed with pt  by my nurse highlighting any changes in therapy/plan of care  recommended at today's visit.      Christinia Gully, MD 11/19/2018

## 2018-11-19 NOTE — Assessment & Plan Note (Signed)
As of  11/19/2018  = 3lpm 24/7    Adequate control on present rx, reviewed in detail with pt > no change in rx needed     I had an extended discussion with the patient reviewing all relevant studies completed to date and  lasting 25 minutes of a 40  minute office visit with pt new to me    re  severe non-specific but potentially very serious refractory respiratory symptoms of uncertain and potentially multiple  Etiologies.  I performed device teaching  using a teach back technique which also  extended face to face time for this visit (see above)    Each maintenance medication was reviewed in detail including most importantly the difference between maintenance and prns and under what circumstances the prns are to be triggered using an action plan format that is not reflected in the computer generated alphabetically organized AVS.    Please see AVS for specific instructions unique to this office visit that I personally wrote and verbalized to the the pt in detail and then reviewed with pt  by my nurse highlighting any changes in therapy/plan of care  recommended at today's visit.

## 2018-11-19 NOTE — Assessment & Plan Note (Addendum)
Quit smoking 2015  - Spirometry 11/19/2018  FEV1 1.00 (36%)  Ratio 0.49  With no response  To saba on ? Prior to test  - - The proper method of use, as well as anticipated side effects, of an elipta inhaler were discussed and demonstrated to the patient.   Per hx  Group D in terms of symptom/risk and laba/lama/ICS  therefore appropriate rx at this point >>>  trelegy is approp but so would be symb/ spiriva free thru the [place], it's just that they require more face to face teaching   Also, re saba use:  I spent extra time with pt today reviewing appropriate use of albuterol for prn use on exertion with the following points: 1) saba is for relief of sob that does not improve by walking a slower pace or resting but rather if the pt does not improve after trying this first. 2) If the pt is convinced, as many are, that saba helps recover from activity faster then it's easy to tell if this is the case by re-challenging : ie stop, take the inhaler, then p 5 minutes try the exact same activity (intensity of workload) that just caused the symptoms and see if they are substantially diminished or not after saba 3) if there is an activity that reproducibly causes the symptoms, try the saba 15 min before the activity on alternate days   If in fact the saba really does help, then fine to continue to use it prn but advised may need to look closer at the maintenance regimen being used to achieve better control of airways disease with exertion.   Pt informed of the seriousness of COVID 19 infection as a direct risk to their health  and safey and to those of their loved ones and should continue to wear facemask in public and minimize exposure to public locations but especially avoid any area or activity where non-close contacts are not observing distancing or wearing an appropriate face mask.

## 2018-11-19 NOTE — Patient Instructions (Addendum)
It would be preferable if you would take higher doses of diovan and stop lisinopril and the latter causes confusion regarding interpretation of symptoms that look like copd in many cause    The alternative to trelegy would be restarting symbicort using spiriva in combination free thru the [place]   Please schedule a follow up visit in 3 months but call sooner if needed

## 2018-11-22 DIAGNOSIS — J432 Centrilobular emphysema: Secondary | ICD-10-CM | POA: Diagnosis not present

## 2018-11-22 DIAGNOSIS — R0902 Hypoxemia: Secondary | ICD-10-CM | POA: Diagnosis not present

## 2018-12-05 NOTE — Progress Notes (Signed)
Reviewed and agree with assessment/plan.   Taylor Levick, MD [facility] 04/20/2016, 12:24 PM Pager:  [phone]  

## 2018-12-15 DIAGNOSIS — R0902 Hypoxemia: Secondary | ICD-10-CM | POA: Diagnosis not present

## 2018-12-15 DIAGNOSIS — J432 Centrilobular emphysema: Secondary | ICD-10-CM | POA: Diagnosis not present

## 2018-12-23 DIAGNOSIS — R0902 Hypoxemia: Secondary | ICD-10-CM | POA: Diagnosis not present

## 2018-12-23 DIAGNOSIS — J432 Centrilobular emphysema: Secondary | ICD-10-CM | POA: Diagnosis not present

## 2019-01-15 DIAGNOSIS — J432 Centrilobular emphysema: Secondary | ICD-10-CM | POA: Diagnosis not present

## 2019-01-15 DIAGNOSIS — R0902 Hypoxemia: Secondary | ICD-10-CM | POA: Diagnosis not present

## 2019-01-23 DIAGNOSIS — R0902 Hypoxemia: Secondary | ICD-10-CM | POA: Diagnosis not present

## 2019-01-23 DIAGNOSIS — J432 Centrilobular emphysema: Secondary | ICD-10-CM | POA: Diagnosis not present

## 2019-02-14 DIAGNOSIS — R0902 Hypoxemia: Secondary | ICD-10-CM | POA: Diagnosis not present

## 2019-02-14 DIAGNOSIS — J432 Centrilobular emphysema: Secondary | ICD-10-CM | POA: Diagnosis not present

## 2019-02-19 ENCOUNTER — Ambulatory Visit: Payer: Non-veteran care | Admitting: Internal Medicine

## 2019-02-22 DIAGNOSIS — J432 Centrilobular emphysema: Secondary | ICD-10-CM | POA: Diagnosis not present

## 2019-02-22 DIAGNOSIS — R0902 Hypoxemia: Secondary | ICD-10-CM | POA: Diagnosis not present

## 2019-03-04 ENCOUNTER — Telehealth: Payer: Self-pay | Admitting: Internal Medicine

## 2019-03-04 NOTE — Telephone Encounter (Signed)
Call returned to patient confirmed DOB, requesting oxygen tanks be delivered. I made him aware in the future he will need to contact [facility], he states he called [employer] two weeks ago and still has not gotten any oxygen. He states maybe if the doctor calls he can get oxygen tanks. Also requesting samples of symbicort. I made him aware we no loner carry symbicort samples but we could have him apply for the patient assistance program. Voiced understanding. Application placed upfront for pick up.   Call made to [place], spoke with Crystal, she states Gerald Williams has a home fill system. He was shipped some supplies on the 30th. She states he has 4 home fill tanks. She states he has called in several times and has been provided education regarding his oxygen. She states he has been on these home fill tanks since June and this is not a new system to him.   Call returned to patient, I made him aware of the conversation I had with [employer]. He states he needs more tanks. I inquired as to how many liters he was using, the patient states 8L. I made him aware per our documentation he should be using 2-3 liters. I told him if he is now using 8 liters he needs to come in to be evaluated. Patient began raising his voice stating [name] what exactly are you all doing to for me in the office with my lungs this bad. Patient got upset and said "[name] nevermind I will handle this myself." I made him aware I was more than willing to help him but if his breathing has declined that drastically in a couple of months he needs to see a provider. He continued to say I will handle this myself.   Will route to Dr. Melvyn Novas as Juluis Rainier. Nothing further need at this time. Pt declined to make an appt.

## 2019-03-04 NOTE — Telephone Encounter (Signed)
Patient call back, he apologized for being mean and having an attitude. He states that he does not come in because he always has a bill and cannot afford the bills he has and cant afford to make more. I made him aware we can't help him if we don't know what is going on. Appt made. I made him aware I would send a message to billing to see if there is anything we can do to help.   Kathlee Nations please advise. Does pt have a debt with [place] if so can he have like a payment plan or something? Thanks.

## 2019-03-06 ENCOUNTER — Ambulatory Visit: Payer: Non-veteran care | Admitting: Internal Medicine

## 2019-03-07 ENCOUNTER — Telehealth: Payer: Self-pay | Admitting: Internal Medicine

## 2019-03-07 NOTE — Telephone Encounter (Signed)
Called and spoke with pt in regards to the info stated by MW. Pt said he received a call from the [place] stating that they had some symb for him that he was going to go pick up tomorrow 11/13. Stated to pt that MW said to bring all meds and formulary from [place] with him to upcoming OV if possible and pt verbalized understanding. Nothing further needed.

## 2019-03-07 NOTE — Telephone Encounter (Signed)
Called and spoke with Patient. Patient stated he has been having increased sob with exertion.  Patient stated he would like a prescription of Symbicort sent to pharmacy. Symbicort is not on current med list.  Patient stated he has not been using any other inhalers, except Symbicort. Patient scheduled OV with Dr Melvyn Novas, 03/11/19, at 0930.  Message routed to Dr. Melvyn Novas to advise on Symbicort prescription  LOV 11/19/18 with MW-    Instructions  It would be preferable if you would take higher doses of diovan and stop lisinopril and the latter causes confusion regarding interpretation of symptoms that look like copd in many cause    The alternative to trelegy would be restarting symbicort using spiriva in combination free thru the [place]   Please schedule a follow up visit in 3 months but call sooner if needed

## 2019-03-07 NOTE — Telephone Encounter (Signed)
My understanding [facility] but [facility] to [facility] symb 160 Take 2 puffs first thing in am and then another 2 puffs about 12 hours later.    only give one  Ov with all meds and [facility] formul[facility] if possible

## 2019-03-11 ENCOUNTER — Encounter: Payer: Self-pay | Admitting: Internal Medicine

## 2019-03-11 ENCOUNTER — Other Ambulatory Visit: Payer: Self-pay

## 2019-03-11 ENCOUNTER — Ambulatory Visit: Payer: Medicare Other | Admitting: Internal Medicine

## 2019-03-11 VITALS — BP 126/74 | HR 91 | Temp 98.7°F | Ht 65.0 in | Wt 176.6 lb

## 2019-03-11 DIAGNOSIS — J449 Chronic obstructive pulmonary disease, unspecified: Secondary | ICD-10-CM

## 2019-03-11 DIAGNOSIS — J9611 Chronic respiratory failure with hypoxia: Secondary | ICD-10-CM

## 2019-03-11 DIAGNOSIS — I10 Essential (primary) hypertension: Secondary | ICD-10-CM

## 2019-03-11 DIAGNOSIS — J439 Emphysema, unspecified: Secondary | ICD-10-CM | POA: Diagnosis not present

## 2019-03-11 NOTE — Telephone Encounter (Signed)
Pt.scheduled to be seen in office today.

## 2019-03-11 NOTE — Progress Notes (Signed)
Gerald Williams, male    DOB: 1950-05-30    MRN: [id number]   Brief patient profile:  68 yobm quit smoking 08/2013 (pt says 2010)  GOLD III/ group D symptoms  with  better on trelegy vs symbicort with less need for saba but concerned about cost of non-VA meds.      History of Present Illness  11/19/2018  Pulmonary/ 1st office eval/Gerald Williams on ACEi and ARB Chief Complaint  Patient presents with  . Follow-up    Breathing is overall doing well. He is using his albuterol inhaler 2-3 x per day and has not used his neb.   Dyspnea:  Can walk a half a block on 3lpm but does not check sats walking Cough: none  Sleep: sleeps on cpap/ 3lpm  SABA use: while on trelegy not needing much albuterol  rec It would be preferable if you would take higher doses of diovan and stop lisinopril and the latter causes confusion regarding interpretation of symptoms that look like copd in many cause  The alternative to trelegy would be restarting symbicort using spiriva in combination free thru the [place]   03/11/2019  f/u ov/Gerald Williams re:  GOLD III spirometry / 02 dep  Very poor hfa maint symb 160 2bid /[facility]  Chief Complaint  Patient presents with  . Follow-up    Patient comes in today for increased shortness of breath with exertion. Patient is normally on 3L and has been turning it up to 4-5L when moving around because he is so short of breath. Patient wants to switch home health care companies and wants a home concentrator.  Dyspnea:  hc parking into store = [employer] = can't walk 100 yards even at a slow pace at a flat grade s stopping due to sob   Cough: daytime minimal mucus, sporadic  Sleeping: fine 3 pillows and cpoap SABA use:   02: 3lpm 95% when check  - turns it up to 4  Lpmwalking with sats   95%  - sleeping cpap / 3lpm   No obvious day to day or daytime variability or assoc excess/ purulent sputum or mucus plugs or hemoptysis or cp or chest tightness, subjective wheeze or overt sinus or hb symptoms.    No  without nocturnal  or early am exacerbation  of respiratory  c/o's or need for noct saba. Also denies any obvious fluctuation of symptoms with weather or environmental changes or other aggravating or alleviating factors except as outlined above   No unusual exposure hx or h/o childhood pna/ asthma or knowledge of premature birth.  Current Allergies, Complete Past Medical History, Past Surgical History, Family History, and Social History were reviewed in [employer] record.  ROS  The following are not active complaints unless bolded Hoarseness, sore throat, dysphagia, dental problems, itching, sneezing,  nasal congestion or discharge of excess mucus or purulent secretions, ear ache,   fever, chills, sweats, unintended wt loss or wt gain, classically pleuritic or exertional cp,  orthopnea pnd or arm/hand swelling  or leg swelling, presyncope, palpitations, abdominal pain, anorexia, nausea, vomiting, diarrhea  or change in bowel habits or change in bladder habits, change in stools or change in urine, dysuria, hematuria,  rash, arthralgias, visual complaints, headache, numbness, weakness or ataxia or problems with walking or coordination,  change in mood or  memory.        Current Meds -  - NOTE:   Unable to verify as accurately reflecting what pt takes  Medication Sig  . atorvastatin (LIPITOR) 80 MG tablet Take 80 mg by mouth daily.  . budesonide-formoterol (SYMBICORT) 160-4.5 MCG/ACT inhaler Inhale 2 puffs into the lungs 2 (two) times daily.  . cholecalciferol (VITAMIN D3) 25 MCG (1000 UT) tablet Take 1,000 Units by mouth daily.  . DULoxetine (CYMBALTA) 30 MG capsule Take 30 mg by mouth daily.  Marland Kitchen ketotifen (ZADITOR) 0.025 % ophthalmic solution 1 drop 2 (two) times daily.  . prazosin (MINIPRESS) 2 MG capsule Take 2 mg by mouth at bedtime.  . prazosin (MINIPRESS) 5 MG capsule Take 5 mg by mouth at bedtime.  . tadalafil (CIALIS) 20 MG tablet Take 20 mg by mouth daily  as needed for erectile dysfunction.  Marland Kitchen tiotropium (SPIRIVA) 18 MCG inhalation capsule Place 18 mcg into inhaler and inhale daily.             Objective:     amb bm nad   Wt Readings from Last 3 Encounters:  03/11/19 176 lb 9.6 oz (80.1 kg)  11/19/18 166 lb 9.6 oz (75.6 kg)  10/24/18 160 lb (72.6 kg)     BP 126/74 (BP Location: Left Arm, Patient Position: Sitting, Cuff Size: Normal)   Pulse 91   Temp 98.7 F (37.1 C) (Temporal)   Ht 5\' 5"  (1.651 m)   Wt 176 lb 9.6 oz (80.1 kg)   SpO2 98% Comment: on 3L  BMI 29.39 kg/m    HEENT : pt wearing mask not removed for exam due to covid -19 concerns.    NECK :  without JVD/Nodes/TM/ nl carotid upstrokes bilaterally   LUNGS: no acc muscle use,  Mod barrel  contour chest wall with bilateral  Distant bs s audible wheeze and  without cough on insp or exp maneuvers and mod  Hyperresonant  to  percussion bilaterally     CV:  RRR  no s3 or murmur or increase in P2, and no edema   ABD:  soft and nontender with pos mid insp Hoover's  in the supine position. No bruits or organomegaly appreciated, bowel sounds nl  MS:     ext warm without deformities, calf tenderness, cyanosis or clubbing No obvious joint restrictions   SKIN: warm and dry without lesions    NEURO:  alert, approp, nl sensorium with  no motor or cerebellar deficits apparent.               Assessment

## 2019-03-11 NOTE — Patient Instructions (Addendum)
Plan A = Automatic = Always=   Symbicort 160 Take 2 puffs first thing in am and then another 2 puffs about 12 hours later and spiriva each am   Plan B = Backup (to supplement plan A, not to replace it) Only use your albuterol inhaler or combivent  as a rescue medication to be used if you can't catch your breath by resting or doing a relaxed purse lip breathing pattern.  - The less you use it, the better it will work when you need it. - Ok to use the inhaler up to 2 puffs (combivent one puff)   every 4 hours if you must but call for appointment if use goes up over your usual need - Don't leave home without it !!  (think of it like the spare tire for your car)   Plan C = Crisis (instead of Plan B but only if Plan B stops working) - only use your albuterol nebulizer if you first try Plan B and it fails to help > ok to use the nebulizer up to every 4 hours but if start needing it regularly call for immediate appointment   Please schedule a follow up office visit in 4 weeks, call sooner if needed with all medications /inhalers/ solutions in hand so we can verify exactly what you are taking. This includes all medications from all doctors and over the [place] separate them into two bags:  the ones you take automatically, no matter what, vs the ones you take just when you feel you need them "BAG #2 is UP TO YOU"  - this will really help [place] help you take your medications more effectively.

## 2019-03-12 ENCOUNTER — Encounter: Payer: Self-pay | Admitting: Internal Medicine

## 2019-03-12 ENCOUNTER — Telehealth: Payer: Self-pay | Admitting: Internal Medicine

## 2019-03-12 NOTE — Telephone Encounter (Signed)
Call returned to patient, confirmed DOB, he states he called [facility] and they told him they do not have the order. I made him aware I would give [facility] a call. Pt states he does not need a call back.   Per our documentation order was sent. Call made to [facility], spoke with Levada Dy, she states they do have the order.   Nothing further needed at this time.

## 2019-03-12 NOTE — Assessment & Plan Note (Signed)
Adequate control on present rx off ACEi ? Since when, reviewed in detail with pt > no change in rx needed    Although even in retrospect it may not be clear the ACEi contributed to the pt's symptoms,  Pt improved off them and adding them back at this point or in the future would risk confusion in interpretation of non-specific respiratory symptoms to which this patient is prone  ie  Better not to muddy the waters here and continue off acei.

## 2019-03-12 NOTE — Telephone Encounter (Signed)
LMTCB

## 2019-03-12 NOTE — Telephone Encounter (Signed)
Pt returning call.  [phone].

## 2019-03-12 NOTE — Assessment & Plan Note (Signed)
Quit smoking 2015  - Spirometry 11/19/2018  FEV1 1.00 (36%)  Ratio 0.49  With no response  To saba on ? Prior to test - 03/11/2019  After extensive coaching inhaler device,  effectiveness  < 25% (way too fast, way short Ti, double trigger)  -  50% after coaching    Group D in terms of symptom/risk and laba/lama/ICS  therefore appropriate rx at this point >>>  Symb/spiriva best choice for now but must master technique and take med reconciliation more seriously as he gets his meds from the [facility]:  formulary restrictions will be an ongoing challenge for the forseable future and I would be happy to pick an alternative if the pt will first  provide me a list of them -  pt  will need to return here for training for any new device that is required eg dpi vs hfa vs respimat.    In the meantime we can always try to  provide samples so that the patient never runs out of any needed respiratory medications.

## 2019-03-12 NOTE — Assessment & Plan Note (Signed)
As of  11/19/2018  = 3lpm 24/7   - 03/11/2019 Patient Saturations on Room Air at Rest = 100%  Room Air while Ambulating = 88% and on 3 Liters of pulsed oxygen while Ambulating = 92% so approved for POC   If he would learn to be more consistent with meds then may improve his gas exchange as well but for now rx = 3lpm at home and with activity but ok to use POC if available    I had an extended discussion with the patient reviewing all relevant studies completed to date and  lasting 15 to 20 minutes of a 25 minute visit  which included directly observing ambulatory 02 saturation study documented in a/p section of  today's  office note.   Each maintenance medication was reviewed in detail including most importantly the difference between maintenance and prns and under what circumstances the prns are to be triggered using an action plan format that is not reflected in the computer generated alphabetically organized AVS.     Please see AVS for specific instructions unique to this visit that I personally wrote and verbalized to the the pt in detail and then reviewed with pt  by my nurse highlighting any changes in therapy recommended at today's visit .

## 2019-03-14 ENCOUNTER — Telehealth: Payer: Self-pay | Admitting: Internal Medicine

## 2019-03-14 NOTE — Telephone Encounter (Signed)
Spoke with pt, he wanted [place] to send a Rx for an [facility] since [facility] could not provide him with a POC. Can we sent Rx to [facility]? MW please advise.   Wells Guiles

## 2019-03-14 NOTE — Telephone Encounter (Signed)
Form was not found on Dr. Gustavus Bryant desk, but was with [facility] Ronalee Belts. Will need signature and then able to fax to [facility] at [phone].

## 2019-03-14 NOTE — Telephone Encounter (Signed)
Called the patient back advised him of the response from Dr. Melvyn Novas. The patient stated he spoke with Wells Guiles with [employer] who stated she is the rep for this area and covers [facility] and knows Dr. Melvyn Novas.   The patient is also aware of the out of pocket costs and has already arranged a payment plan with [facility]. I told him that if there was anything being faxed to Dr. Gustavus Bryant attention regarding his request I will try to find it, but we would need a number to fax the request to.   The patient stated he will call back with the contact numbers that he has so the prescription can be sent to the right place.  Will check Dr. Gustavus Bryant desk and with Magda Paganini to see if any fax has been received.

## 2019-03-14 NOTE — Telephone Encounter (Signed)
Dr. Melvyn Novas signed the form and he crossed out "continuous" for the liter flow and wrote in "pulsed".  I called the patient to make him aware the form was received and signed by Dr. Melvyn Novas and the change Dr. Melvyn Novas wrote on the form.   The patient stated pulsed would not work for him because he tried it before. I let him know that Dr. Melvyn Novas wrote it himself on the form based on his walk done here at the office. Patient stated they have machines that go up to 5L, I told him we will fax the form signed. He agreed he would talk to [facility] about the amount of liter flow the machine they give him will have.  Form faxed. Nothing further needed at this time.

## 2019-03-14 NOTE — Telephone Encounter (Signed)
See ov under problem chronic resp failure - we addressed this then  We approved him already for POC 3lpm walking it doesn't matter to me where he goes to get it or which machine he uses but he needs to understand unless he's paying cash he has to follow the rules of the DME company (or they will not get reimbursed and will not offer the service)

## 2019-03-17 DIAGNOSIS — J432 Centrilobular emphysema: Secondary | ICD-10-CM | POA: Diagnosis not present

## 2019-03-17 DIAGNOSIS — R0902 Hypoxemia: Secondary | ICD-10-CM | POA: Diagnosis not present

## 2019-03-25 DIAGNOSIS — J432 Centrilobular emphysema: Secondary | ICD-10-CM | POA: Diagnosis not present

## 2019-03-25 DIAGNOSIS — R0902 Hypoxemia: Secondary | ICD-10-CM | POA: Diagnosis not present

## 2019-04-16 DIAGNOSIS — R0902 Hypoxemia: Secondary | ICD-10-CM | POA: Diagnosis not present

## 2019-04-16 DIAGNOSIS — J432 Centrilobular emphysema: Secondary | ICD-10-CM | POA: Diagnosis not present

## 2019-04-22 ENCOUNTER — Ambulatory Visit: Payer: Non-veteran care | Admitting: Internal Medicine

## 2019-04-24 DIAGNOSIS — J432 Centrilobular emphysema: Secondary | ICD-10-CM | POA: Diagnosis not present

## 2019-04-24 DIAGNOSIS — R0902 Hypoxemia: Secondary | ICD-10-CM | POA: Diagnosis not present

## 2019-05-06 ENCOUNTER — Emergency Department (HOSPITAL_COMMUNITY): Payer: No Typology Code available for payment source

## 2019-05-06 ENCOUNTER — Encounter (HOSPITAL_COMMUNITY): Payer: Self-pay

## 2019-05-06 ENCOUNTER — Telehealth: Payer: Self-pay | Admitting: Internal Medicine

## 2019-05-06 ENCOUNTER — Observation Stay (HOSPITAL_COMMUNITY)
Admission: EM | Admit: 2019-05-06 | Discharge: 2019-05-07 | Disposition: A | Discharge status: Home / Self Care | Payer: No Typology Code available for payment source | Attending: Family Medicine | Admitting: Family Medicine

## 2019-05-06 ENCOUNTER — Other Ambulatory Visit: Payer: Self-pay

## 2019-05-06 DIAGNOSIS — N179 Acute kidney failure, unspecified: Secondary | ICD-10-CM | POA: Insufficient documentation

## 2019-05-06 DIAGNOSIS — N181 Chronic kidney disease, stage 1: Secondary | ICD-10-CM | POA: Diagnosis not present

## 2019-05-06 DIAGNOSIS — E785 Hyperlipidemia, unspecified: Secondary | ICD-10-CM | POA: Insufficient documentation

## 2019-05-06 DIAGNOSIS — I10 Essential (primary) hypertension: Secondary | ICD-10-CM

## 2019-05-06 DIAGNOSIS — Z20822 Contact with and (suspected) exposure to covid-19: Secondary | ICD-10-CM | POA: Insufficient documentation

## 2019-05-06 DIAGNOSIS — R7303 Prediabetes: Secondary | ICD-10-CM | POA: Insufficient documentation

## 2019-05-06 DIAGNOSIS — Z87891 Personal history of nicotine dependence: Secondary | ICD-10-CM | POA: Diagnosis not present

## 2019-05-06 DIAGNOSIS — R079 Chest pain, unspecified: Secondary | ICD-10-CM | POA: Diagnosis not present

## 2019-05-06 DIAGNOSIS — I129 Hypertensive chronic kidney disease with stage 1 through stage 4 chronic kidney disease, or unspecified chronic kidney disease: Secondary | ICD-10-CM | POA: Insufficient documentation

## 2019-05-06 DIAGNOSIS — J439 Emphysema, unspecified: Secondary | ICD-10-CM | POA: Diagnosis present

## 2019-05-06 DIAGNOSIS — F4310 Post-traumatic stress disorder, unspecified: Secondary | ICD-10-CM | POA: Insufficient documentation

## 2019-05-06 DIAGNOSIS — Z6828 Body mass index (BMI) 28.0-28.9, adult: Secondary | ICD-10-CM | POA: Insufficient documentation

## 2019-05-06 DIAGNOSIS — R0789 Other chest pain: Principal | ICD-10-CM | POA: Insufficient documentation

## 2019-05-06 DIAGNOSIS — I2510 Atherosclerotic heart disease of native coronary artery without angina pectoris: Secondary | ICD-10-CM | POA: Diagnosis not present

## 2019-05-06 DIAGNOSIS — G4733 Obstructive sleep apnea (adult) (pediatric): Secondary | ICD-10-CM | POA: Diagnosis not present

## 2019-05-06 DIAGNOSIS — J9611 Chronic respiratory failure with hypoxia: Secondary | ICD-10-CM

## 2019-05-06 DIAGNOSIS — J432 Centrilobular emphysema: Secondary | ICD-10-CM | POA: Diagnosis not present

## 2019-05-06 DIAGNOSIS — Z79899 Other long term (current) drug therapy: Secondary | ICD-10-CM | POA: Insufficient documentation

## 2019-05-06 DIAGNOSIS — E663 Overweight: Secondary | ICD-10-CM | POA: Insufficient documentation

## 2019-05-06 DIAGNOSIS — Z7982 Long term (current) use of aspirin: Secondary | ICD-10-CM | POA: Diagnosis not present

## 2019-05-06 DIAGNOSIS — G629 Polyneuropathy, unspecified: Secondary | ICD-10-CM | POA: Diagnosis not present

## 2019-05-06 DIAGNOSIS — Z9981 Dependence on supplemental oxygen: Secondary | ICD-10-CM | POA: Insufficient documentation

## 2019-05-06 DIAGNOSIS — Z8249 Family history of ischemic heart disease and other diseases of the circulatory system: Secondary | ICD-10-CM | POA: Insufficient documentation

## 2019-05-06 DIAGNOSIS — Z7951 Long term (current) use of inhaled steroids: Secondary | ICD-10-CM | POA: Insufficient documentation

## 2019-05-06 DIAGNOSIS — Z833 Family history of diabetes mellitus: Secondary | ICD-10-CM | POA: Insufficient documentation

## 2019-05-06 LAB — CBC WITH DIFFERENTIAL/PLATELET
Abs Immature Granulocytes: 0 10*3/uL (ref 0.00–0.07)
Basophils Absolute: 0 10*3/uL (ref 0.0–0.1)
Basophils Relative: 1 %
Eosinophils Absolute: 0.2 10*3/uL (ref 0.0–0.5)
Eosinophils Relative: 4 %
HCT: 40.3 % (ref 39.0–52.0)
Hemoglobin: 13 g/dL (ref 13.0–17.0)
Immature Granulocytes: 0 %
Lymphocytes Relative: 41 %
Lymphs Abs: 2 10*3/uL (ref 0.7–4.0)
MCH: 30.2 pg (ref 26.0–34.0)
MCHC: 32.3 g/dL (ref 30.0–36.0)
MCV: 93.7 fL (ref 80.0–100.0)
Monocytes Absolute: 0.5 10*3/uL (ref 0.1–1.0)
Monocytes Relative: 9 %
Neutro Abs: 2.3 10*3/uL (ref 1.7–7.7)
Neutrophils Relative %: 45 %
Platelets: 218 10*3/uL (ref 150–400)
RBC: 4.3 MIL/uL (ref 4.22–5.81)
RDW: 13.9 % (ref 11.5–15.5)
WBC: 5 10*3/uL (ref 4.0–10.5)
nRBC: 0 % (ref 0.0–0.2)

## 2019-05-06 LAB — COMPREHENSIVE METABOLIC PANEL
ALT: 20 U/L (ref 0–44)
AST: 22 U/L (ref 15–41)
Albumin: 4.1 g/dL (ref 3.5–5.0)
Alkaline Phosphatase: 68 U/L (ref 38–126)
Anion gap: 7 (ref 5–15)
BUN: 18 mg/dL (ref 8–23)
CO2: 28 mmol/L (ref 22–32)
Calcium: 9.1 mg/dL (ref 8.9–10.3)
Chloride: 106 mmol/L (ref 98–111)
Creatinine, Ser: 1.28 mg/dL — ABNORMAL HIGH (ref 0.61–1.24)
GFR calc Af Amer: >60 mL/min (ref >60)
GFR calc non Af Amer: 58 mL/min — ABNORMAL LOW (ref >60)
Glucose, Bld: 99 mg/dL (ref 70–99)
Potassium: 4.3 mmol/L (ref 3.5–5.1)
Sodium: 141 mmol/L (ref 135–145)
Total Bilirubin: 0.9 mg/dL (ref 0.3–1.2)
Total Protein: 7.4 g/dL (ref 6.5–8.1)

## 2019-05-06 LAB — RAPID URINE DRUG SCREEN, HOSP PERFORMED
Amphetamines: NOT DETECTED
Barbiturates: NOT DETECTED
Benzodiazepines: NOT DETECTED
Cocaine: NOT DETECTED
Opiates: NOT DETECTED
Tetrahydrocannabinol: NOT DETECTED

## 2019-05-06 LAB — TROPONIN I (HIGH SENSITIVITY)
Troponin I (High Sensitivity): 9 ng/L (ref <18)
Troponin I (High Sensitivity): 9 ng/L (ref <18)

## 2019-05-06 MED ORDER — MORPHINE SULFATE (PF) 2 MG/ML IV SOLN
1.0000 mg | INTRAVENOUS | Status: DC | PRN
  Administered 2019-05-06 – 2019-05-07 (×2): 2 mg via INTRAVENOUS
  Filled 2019-05-06 (×2): qty 1

## 2019-05-06 MED ORDER — METOPROLOL SUCCINATE ER 25 MG PO TB24
25.0000 mg | ORAL_TABLET | Freq: Every day | ORAL | Status: DC
  Administered 2019-05-06 – 2019-05-07 (×2): 25 mg via ORAL
  Filled 2019-05-06 (×2): qty 1

## 2019-05-06 MED ORDER — NITROGLYCERIN 0.4 MG SL SUBL
0.4000 mg | SUBLINGUAL_TABLET | SUBLINGUAL | Status: DC | PRN
  Administered 2019-05-06 (×4): 0.4 mg via SUBLINGUAL
  Filled 2019-05-06 (×2): qty 1

## 2019-05-06 MED ORDER — ONDANSETRON HCL 4 MG/2ML IJ SOLN
4.0000 mg | Freq: Four times a day (QID) | INTRAMUSCULAR | Status: DC | PRN
  Administered 2019-05-06 – 2019-05-07 (×2): 4 mg via INTRAVENOUS
  Filled 2019-05-06: qty 2

## 2019-05-06 MED ORDER — ASPIRIN EC 325 MG PO TBEC
325.0000 mg | DELAYED_RELEASE_TABLET | Freq: Once | ORAL | Status: AC
  Administered 2019-05-06: 325 mg via ORAL
  Filled 2019-05-06: qty 1

## 2019-05-06 NOTE — H&P (Signed)
History and Physical    Gerald Williams KKX:[id number] DOB: Mar 11, 1951 DOA: 05/06/2019  PCP: [name], MD   Patient coming from: Home   Chief Complaint: Chest pain   HPI: Gerald Williams is a 69 y.o. male with medical history significant for COPD with chronic hypoxic respiratory failure, coronary artery disease, and history of polysubstance abuse, now presenting to the emergency department for evaluation of chest pain.  Patient reports fairly constant pain in the central chest for roughly 2 weeks, states that he had a stress test performed last week through the [place] in [place], is not sure of the results, and was having more severe pain over the past 2 days that now prompted his presentation to the ED.  He is unable to identify any alleviating or exacerbating factors.  He denies any significant cough or shortness of breath.  Reports increased fatigue over the past 2 weeks.  Denies fevers or chills.  Denies leg swelling or leg tenderness.  ED Course: Upon arrival to the ED, patient is found to be afebrile, saturating well on his usual 4 L/min of supplemental oxygen, and hypertensive to 165/110.  EKG features sinus rhythm with PAC.  Chest x-ray is negative for acute cardiopulmonary disease.  Chemistry panel with creatinine 1.28, similar to priors.  CBC is unremarkable.  High-sensitivity troponin is normal x2.  Patient was given 325 mg of aspirin in the emergency department and cardiology was consulted by the ED physician, recommending a medical admission.  Covid PCR screening test is in process.  Review of Systems:  All other systems reviewed and apart from HPI, are negative.  Past Medical History:  Diagnosis Date  . Emphysema   . Erectile dysfunction   . Hyperlipemia   . Hypertension   . Polysubstance abuse (HCC)    stopped using crack/cocaine December 4th 20-14  . PTSD (post-traumatic stress disorder)   . Sleep apnea     Past Surgical History:  Procedure Laterality Date  . CARDIAC  CATHETERIZATION    . LEFT HEART CATHETERIZATION WITH CORONARY ANGIOGRAM N/A 07/15/2014   Procedure: LEFT HEART CATHETERIZATION WITH CORONARY ANGIOGRAM;  Surgeon: Lennette Bihari, MD;  Location: [facility] CATH LAB;  Service: Cardiovascular;  Laterality: N/A;     reports that he quit smoking about 5 years ago. His smoking use included cigarettes. He started smoking about 56 years ago. He has a 75.00 pack-year smoking history. He has never used smokeless tobacco. He reports that he does not drink alcohol or use drugs.  No Known Allergies  Family History  Problem Relation Age of Onset  . Diabetes Mother        Deceased  . Heart disease Father        Deceased  . Heart attack Father        x5  . Cancer - Other Sister   . Healthy Son   . Healthy Daughter   . Heart attack Brother        Deceased  . Alcohol abuse Brother        Deceased     Prior to Admission medications   Medication Sig Start Date End Date Taking? Authorizing Provider  acetaminophen (TYLENOL) 325 MG tablet Take 650 mg by mouth every 6 (six) hours as needed for moderate pain.    Yes [provider]  albuterol (PROVENTIL HFA;VENTOLIN HFA) 108 (90 BASE) MCG/ACT inhaler Inhale 2 puffs into the lungs every 6 (six) hours as needed for shortness of breath.   Yes [provider]  albuterol (PROVENTIL) (2.5 MG/3ML) 0.083% nebulizer solution Take 2.5 mg by nebulization 2 (two) times daily.    Yes [provider]  aspirin 81 MG tablet Take 81 mg by mouth daily.   Yes [provider]  atorvastatin (LIPITOR) 80 MG tablet Take 80 mg by mouth daily.   Yes [provider]  budesonide-formoterol (SYMBICORT) 160-4.5 MCG/ACT inhaler Inhale 2 puffs into the lungs 2 (two) times daily.   Yes [provider]  cholecalciferol (VITAMIN D3) 25 MCG (1000 UT) tablet Take 1,000 Units by mouth daily.   Yes [provider]  finasteride (PROSCAR) 5 MG tablet Take 5 mg by mouth daily.   Yes [provider]  mirtazapine (REMERON) 15 MG tablet Take 15 mg by mouth 2 (two) times daily.    Yes [provider]  tiotropium (SPIRIVA) 18 MCG inhalation capsule Place 18 mcg into inhaler and inhale daily.   Yes [provider]  vardenafil (LEVITRA) 20 MG tablet Take 20 mg by mouth daily as needed for erectile dysfunction.   Yes [provider]  glucose blood (COOL BLOOD GLUCOSE TEST STRIPS) test strip Used to test blood sugar x2 daily---diagnosis code r73.03--for one touch verio flex 12/08/15   Binnie Rail, MD  Lancets MISC Used to test blood sugar x2 daily---diagnosis code r73.03--for one touch verio flex 12/08/15   Binnie Rail, MD  NON FORMULARY at bedtime. CPAP - followed at [facility]    [provider]  nortriptyline (PAMELOR) 10 MG capsule Take 1 capsule (10 mg total) by mouth at bedtime. Patient not taking: Reported on 05/06/2019 09/23/15   Alda Berthold, DO  OXYGEN 3lpm 24/7  Huey Romans    [provider]  Polyethyl Glycol-Propyl Glycol 0.4-0.3 % SOLN Apply 2 drops to eye as needed. Patient not taking: Reported on 05/06/2019 08/25/16   Nche, Charlene Brooke, NP  UNABLE TO FIND Med Name: CPAP with o2 3lpm with sleep    [provider]  valsartan (DIOVAN) 80 MG tablet Take 1 tablet (80 mg total) by mouth daily. Patient not taking: Reported on 05/06/2019 05/26/15   Binnie Rail, MD    Physical Exam: Vitals:   05/06/19 2020 05/06/19 2044 05/06/19 2100 05/06/19 2130  BP: (!) 154/119 (!) 184/103 (!) 164/109 (!) 155/128  Pulse: 70 76 67 68  Resp: (!) 26 (!) 28 (!) 28 (!) 23  Temp:      SpO2: 97% 100% 100% 100%  Weight:      Height:         Constitutional: NAD, calm  Eyes: PERTLA, lids and conjunctivae normal ENMT: Mucous membranes are moist. Posterior pharynx clear of any exudate or lesions.   Neck: normal, supple, no masses, no thyromegaly Respiratory: clear to auscultation bilaterally, no wheezing, no crackles. Normal respiratory effort.   Cardiovascular: S1 & S2 heard, regular rate and rhythm. No extremity edema.   Abdomen: No distension, no tenderness, soft. Bowel sounds active.  Musculoskeletal: no clubbing / cyanosis. No joint deformity upper and lower extremities.    Skin: no significant rashes, lesions, ulcers. Warm, dry, well-perfused. Neurologic: No facial asymmetry. Sensation intact. Moving all extremities.  Psychiatric:  Alert and oriented x 3. Pleasant, cooperative.    Labs on Admission: I have personally reviewed following labs and imaging studies  CBC: Recent Labs  Lab 05/06/19 1620  WBC 5.0  NEUTROABS 2.3  HGB 13.0  HCT 40.3  MCV 93.7  PLT 762   Basic Metabolic Panel:  Recent Labs  Lab 05/06/19 1620  NA 141  K 4.3  CL 106  CO2 28  GLUCOSE 99  BUN 18  CREATININE 1.28*  CALCIUM 9.1   GFR: Estimated Creatinine Clearance: 54.4 mL/min (A) (by C-G formula based on SCr of 1.28 mg/dL (H)). Liver Function Tests: Recent Labs  Lab 05/06/19 1620  AST 22  ALT 20  ALKPHOS 68  BILITOT 0.9  PROT 7.4  ALBUMIN 4.1   No results for input(s): LIPASE, AMYLASE in the last 168 hours. No results for input(s): AMMONIA in the last 168 hours. Coagulation Profile: No results for input(s): INR, PROTIME in the last 168 hours. Cardiac Enzymes: No results for input(s): CKTOTAL, CKMB, CKMBINDEX, TROPONINI in the last 168 hours. BNP (last 3 results) No results for input(s): PROBNP in the last 8760 hours. HbA1C: No results for input(s): HGBA1C in the last 72 hours. CBG: No results for input(s): GLUCAP in the last 168 hours. Lipid Profile: No results for input(s): CHOL, HDL, LDLCALC, TRIG, CHOLHDL, LDLDIRECT in the last 72 hours. Thyroid Function Tests: No results for input(s): TSH, T4TOTAL, FREET4, T3FREE, THYROIDAB in the last 72 hours. Anemia Panel: No results for input(s): VITAMINB12, FOLATE, FERRITIN, TIBC, IRON, RETICCTPCT in the last 72 hours. Urine analysis:    Component Value Date/Time    COLORURINE YELLOW 01/06/2013 1204   APPEARANCEUR CLEAR 01/06/2013 1204   LABSPEC 1.017 01/06/2013 1204   PHURINE 5.5 01/06/2013 1204   GLUCOSEU NEGATIVE 01/06/2013 1204   HGBUR NEGATIVE 01/06/2013 1204   BILIRUBINUR NEGATIVE 01/06/2013 1204   KETONESUR NEGATIVE 01/06/2013 1204   PROTEINUR NEGATIVE 01/06/2013 1204   UROBILINOGEN 0.2 01/06/2013 1204   NITRITE NEGATIVE 01/06/2013 1204   LEUKOCYTESUR NEGATIVE 01/06/2013 1204   Sepsis Labs: @LABRCNTIP (procalcitonin:4,lacticidven:4) )No results found for this or any previous visit (from the past 240 hour(s)).   Radiological Exams on Admission: DG Chest Portable 1 View  Result Date: 05/06/2019 CLINICAL DATA:  Chest pain. Shortness of breath. Emphysema. EXAM: PORTABLE CHEST 1 VIEW COMPARISON:  10/24/2018 FINDINGS: Numerous leads and wires project over the chest. Midline trachea. Normal heart size and mediastinal contours. No pleural effusion or pneumothorax. Hyperinflation. Linear areas of scarring within both lower lobes. IMPRESSION: 1. No acute cardiopulmonary disease. 2. Hyperinflation, consistent with COPD. Electronically Signed   By: 12/25/2018 M.D.   On: 05/06/2019 16:45    EKG: Independently reviewed. Sinus rhythm, PAC.   Assessment/Plan   1. Chest pain  - Presents with ~2 wks of chest pain, worse over the past 1-2 days, [facility]  - In ED, HS troponin is normal x2, EKG with SR and no acute ischemic findings, and no acute findings on CXR - Full-dose aspirin was given in ED  - Continue cardiac monitoring, continue statin and ASA, start metoprolol for significantly elevated BP, check lipids and A1c, follow-up cardiology recommendations    2. COPD; chronic hypoxic respiratory failure  - No wheezing on admission  - Continue supplemental O2 and inhalers     DVT prophylaxis: Lovenox  Code Status: Full  Family Communication: Discussed with patient  Consults called: Cardiology consulted by ED physician  Admission  status: Observation     07/04/2019, MD [facility] Pager [phone]  If 7PM-7AM, please contact night-coverage [email or web address] Password [email or web address]  05/06/2019, 9:43 PM

## 2019-05-06 NOTE — ED Notes (Signed)
Patient given urinal at this time. 

## 2019-05-06 NOTE — Telephone Encounter (Signed)
Pt c/o of Chest Pain: STAT if CP now or developed within 24 hours  1. Are you having CP right now? yes  2. Are you experiencing any other symptoms (ex. SOB, nausea, vomiting, sweating)? SOB, but he also has emphysema.   3. How long have you been experiencing CP? A few days  4. Is your CP continuous or coming and going? continuous  5. Have you taken Nitroglycerin? No

## 2019-05-06 NOTE — ED Provider Notes (Signed)
[facility]-EMERGENCY DEPT Provider Note   CSN: [id number] Arrival date & time: 05/06/19  1320     History Chief Complaint  Patient presents with  . Shortness of Breath  . Chest Pain    Gerald Williams is a 69 y.o. male.  HPI      Presents with concern for chest pain Hx of emphesema Left sided chest pain, taking 81mg  ASA every morning sometimes takes 2 Chronic headaches and takes medicine fo that but today coming in because feeling sluggish, weak and having pain to left side of chest. Was on nitroglycerin for a while 11yr ago.  Pain has been there 2 weeks, was severe last night.  Friday began feeling tired, had to turn up O2 level to 5, is normally on 3, then reports is on 3-5 chronically.  Coming and going over the last 2 weeks but was mild, resolved with tylenol, then Friday began to have worsening pain and fatigue.  Feels like a dull pain to left chest and down the left arm. Not necessarily worse with ambulation. Is constant since Friday, extra strength tylenol not helping.  Not worse with deep breaths, cough of change of positions.  Not feeling more short of breath than baseline, no cough.  No nausea, no sweating, no lightheadedness.  Hx of neuropathy, no other new leg pain or swelling.   Had catheterization in March 2016, feels symptoms are the same as what he had then  Current pain is 7-8/10  Had stress test last Monday at [facility], does not know results.  No drinking, cigarettes or other drugs  Did not take medicine today  Past Medical History:  Diagnosis Date  . Emphysema   . Erectile dysfunction   . Hyperlipemia   . Hypertension   . Polysubstance abuse (HCC)    stopped using crack/cocaine December 4th 20-14  . PTSD (post-traumatic stress disorder)   . Sleep apnea     Patient Active Problem List   Diagnosis Date Noted  . Chronic respiratory failure with hypoxia (HCC) 09/18/2018  . Exercise hypoxemia 11/07/2017  . Frequent headaches  05/23/2017  . Acute pain of right knee 12/23/2016  . 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 Blood in stool 10/05/2015  . Essential hypertension 06/18/2015  . Cephalalgia 06/18/2015  . Dyslipidemia 06/12/2015  . COPD GOLD III 05/28/2015  . Peripheral neuropathy 05/20/2015  . COPD (chronic obstructive pulmonary disease) with emphysema (HCC) 05/20/2015  . PTSD (post-traumatic stress disorder) 05/20/2015  . Right carotid bruit 05/20/2015  . Frequent urination 05/20/2015  . Hematuria 07/15/2014  . History of tobacco abuse:  quit in ~ 06/2013  07/14/2014  . OSA on CPAP 07/14/2014  . Pre-diabetes 07/14/2014  . Claudication, R>L 07/14/2014  . Cocaine use, history of 03/26/2011  . Chest pain 03/26/2011    Past Surgical History:  Procedure Laterality Date  . CARDIAC CATHETERIZATION    . LEFT HEART CATHETERIZATION WITH CORONARY ANGIOGRAM N/A 07/15/2014   Procedure: LEFT HEART CATHETERIZATION WITH CORONARY ANGIOGRAM;  Surgeon: 07/17/2014, MD;  Location: [facility] CATH LAB;  Service: Cardiovascular;  Laterality: N/A;       Family History  Problem Relation Age of Onset  . Diabetes Mother        Deceased  . Heart disease Father        Deceased  . Heart attack Father        x5  . Cancer - Other Sister   . Healthy Son   .  Healthy Daughter   . Heart attack Brother        Deceased  . Alcohol abuse Brother        Deceased    Social History   Tobacco Use  . Smoking status: Former Smoker    Packs/day: 1.50    Years: 50.00    Pack years: 75.00    Types: Cigarettes    Start date: 04/26/1963    Quit date: 09/22/2013    Years since quitting: 5.6  . Smokeless tobacco: Never Used  . Tobacco comment: patient using the patch, losenges, and medication  Substance Use Topics  . Alcohol use: No    Alcohol/week: 0.0 standard drinks    Comment: He was drinking 3-4 drinks nightly x 40 years, quit in 2014.    . Drug use: No    Comment: history of crack use and drug tested weekly  [facility] Medications Prior to Admission medications   Medication Sig Start Date End Date Taking? Authorizing Provider  acetaminophen (TYLENOL) 325 MG tablet Take 650 mg by mouth every 6 (six) hours as needed for moderate pain.    Yes [provider]  albuterol (PROVENTIL HFA;VENTOLIN HFA) 108 (90 BASE) MCG/ACT inhaler Inhale 2 puffs into the lungs every 6 (six) hours as needed for shortness of breath.   Yes [provider]  albuterol (PROVENTIL) (2.5 MG/3ML) 0.083% nebulizer solution Take 2.5 mg by nebulization 2 (two) times daily.    Yes [provider]  aspirin 81 MG tablet Take 81 mg by mouth daily.   Yes [provider]  atorvastatin (LIPITOR) 80 MG tablet Take 80 mg by mouth daily.   Yes [provider]  budesonide-formoterol (SYMBICORT) 160-4.5 MCG/ACT inhaler Inhale 2 puffs into the lungs 2 (two) times daily.   Yes [provider]  cholecalciferol (VITAMIN D3) 25 MCG (1000 UT) tablet Take 1,000 Units by mouth daily.   Yes [provider]  finasteride (PROSCAR) 5 MG tablet Take 5 mg by mouth daily.   Yes [provider]  mirtazapine (REMERON) 15 MG tablet Take 15 mg by mouth 2 (two) times daily.    Yes [provider]  tiotropium (SPIRIVA) 18 MCG inhalation capsule Place 18 mcg into inhaler and inhale daily.   Yes [provider]  vardenafil (LEVITRA) 20 MG tablet Take 20 mg by mouth daily as needed for erectile dysfunction.   Yes [provider]  glucose blood (COOL BLOOD GLUCOSE TEST STRIPS) test strip Used to test blood sugar x2 daily---diagnosis code r73.03--for one touch verio flex 12/08/15   Binnie Rail, MD  Lancets MISC Used to test blood sugar x2 daily---diagnosis code r73.03--for one touch verio flex 12/08/15   Binnie Rail, MD  NON FORMULARY at bedtime. CPAP - followed at [facility]    [provider]  nortriptyline (PAMELOR) 10 MG capsule Take 1 capsule (10 mg total) by  mouth at bedtime. Patient not taking: Reported on 05/06/2019 09/23/15   Alda Berthold, DO  OXYGEN 3lpm 24/7  Huey Romans    [provider]  Polyethyl Glycol-Propyl Glycol 0.4-0.3 % SOLN Apply 2 drops to eye as needed. Patient not taking: Reported on 05/06/2019 08/25/16   Nche, Charlene Brooke, NP  UNABLE TO FIND Med Name: CPAP with o2 3lpm with sleep    [provider]  valsartan (DIOVAN) 80 MG tablet Take 1 tablet (80 mg total) by mouth daily. Patient not taking: Reported on 05/06/2019 05/26/15  [name], MD    Allergies    Patient has no known allergies.  Review of Systems   Review of Systems  Constitutional: Positive for fatigue. Negative for fever.  HENT: Positive for congestion (chronic). Negative for sore throat.   Eyes: Negative for visual disturbance.  Respiratory: Positive for cough. Negative for shortness of breath.   Cardiovascular: Positive for chest pain. Negative for leg swelling.  Gastrointestinal: Negative for abdominal pain, diarrhea, nausea and vomiting.  Genitourinary: Negative for difficulty urinating.  Musculoskeletal: Negative for back pain, joint swelling and neck stiffness.  Skin: Negative for rash.  Neurological: Positive for headaches (chronic). Negative for syncope and light-headedness.    Physical Exam Updated Vital Signs BP (!) 149/110   Pulse 68   Temp 98 F (36.7 C)   Resp 20   Ht 5' 5.5" (1.664 m)   Wt 77.6 kg   SpO2 100%   BMI 28.02 kg/m   Physical Exam Vitals and nursing note reviewed.  Constitutional:      General: He is not in acute distress.    Appearance: He is well-developed. He is not diaphoretic.  HENT:     Head: Normocephalic and atraumatic.  Eyes:     Conjunctiva/sclera: Conjunctivae normal.  Cardiovascular:     Rate and Rhythm: Normal rate and regular rhythm.     Heart sounds: Normal heart sounds. No murmur. No friction rub. No gallop.   Pulmonary:     Effort: Pulmonary effort is normal. No respiratory  distress.     Breath sounds: Normal breath sounds. No wheezing or rales.  Abdominal:     General: There is no distension.     Palpations: Abdomen is soft.     Tenderness: There is no abdominal tenderness. There is no guarding.  Musculoskeletal:     Cervical back: Normal range of motion.  Skin:    General: Skin is warm and dry.  Neurological:     Mental Status: He is alert and oriented to person, place, and time.     ED Results / Procedures / Treatments   Labs (all labs ordered are listed, but only abnormal results are displayed) Labs Reviewed  COMPREHENSIVE METABOLIC PANEL - Abnormal; Notable for the following components:      Result Value   Creatinine, Ser 1.28 (*)    GFR calc non Af Amer 58 (*)    All other components within normal limits  SARS CORONAVIRUS 2 (TAT 6-24 HRS)  CBC WITH DIFFERENTIAL/PLATELET  RAPID URINE DRUG SCREEN, HOSP PERFORMED  LIPID PANEL  HEMOGLOBIN A1C  TROPONIN I (HIGH SENSITIVITY)  TROPONIN I (HIGH SENSITIVITY)    EKG EKG Interpretation  Date/Time:  Monday May 06 2019 13:28:14 EST Ventricular Rate:  74 PR Interval:    QRS Duration: 83 QT Interval:  405 QTC Calculation: 450 R Axis:   53 Text Interpretation: Sinus rhythm Atrial premature complex No significant change since last tracing Confirmed by Alvira Monday ([id number]) on 05/06/2019 3:51:39 PM   Radiology DG Chest Portable 1 View  Result Date: 05/06/2019 CLINICAL DATA:  Chest pain. Shortness of breath. Emphysema. EXAM: PORTABLE CHEST 1 VIEW COMPARISON:  10/24/2018 FINDINGS: Numerous leads and wires project over the chest. Midline trachea. Normal heart size and mediastinal contours. No pleural effusion or pneumothorax. Hyperinflation. Linear areas of scarring within both lower lobes. IMPRESSION: 1. No acute cardiopulmonary disease. 2. Hyperinflation, consistent with COPD. Electronically Signed   By: Jeronimo Greaves M.D.   On: 05/06/2019 16:45    Procedures  Procedures (including critical  care time)  Medications Ordered in ED Medications  nitroGLYCERIN (NITROSTAT) SL tablet 0.4 mg (0.4 mg Sublingual Given 05/06/19 2154)  metoprolol succinate (TOPROL-XL) 24 hr tablet 25 mg (25 mg Oral Given 05/06/19 2217)  morphine 2 MG/ML injection 1-3 mg (2 mg Intravenous Given 05/06/19 2200)  ondansetron (ZOFRAN) injection 4 mg (4 mg Intravenous Given 05/06/19 2159)  aspirin EC tablet 325 mg (325 mg Oral Given 05/06/19 1848)    ED Course  I have reviewed the triage vital signs and the nursing notes.  Pertinent labs & imaging results that were available during my care of the patient were reviewed by me and considered in my medical decision making (see chart for details).    MDM Rules/Calculators/A&P                      70yo male with history of htn, hlpd, COPD on 3-5L, PTSD, presents with concern for chest pain.  EKG without significant changes. Troponin negative x2. No increased dyspnea, no signs of DVT/PE.   Hx/exam not consistent with dissection.  Pt had told me that symptoms consistent with chest pain he had with prior heart disease in 2016, and discussed this with Cardiology and hospitalist. On further review, it appears he did not have significant obstructive disease in 2016--however given duration since prior catheterization and pt with typical chest pain symptoms and high risk HEART score will continue plan for admission.  Pain improved significantly with nitroglycerin and pt with hx of intermittent CP leading to resting CP concerning for possible angina. Will admit for further care. [facility] one week ago and unable to do so.  Pain improved following nitro. Will admit here to [facility] with Cardiology consult in AM.    Final Clinical Impression(s) / ED Diagnoses Final diagnoses:  Chest pain, unspecified type    Rx / DC Orders ED Discharge Orders    None       Alvira Monday, MD 05/06/19 2348

## 2019-05-06 NOTE — ED Notes (Signed)
Patient told RN that chest pain improved after giving nitro.

## 2019-05-06 NOTE — Telephone Encounter (Signed)
Contacted pt who states he is currently in the ED

## 2019-05-06 NOTE — ED Notes (Addendum)
Pt states his headache is a 10 and his chest pain is a 5.

## 2019-05-06 NOTE — ED Triage Notes (Signed)
Patient  c/o left lower chest pain, SOB, and left arm tingling since 2100 last night. Patient is currently on O2 4L/min via [place] at home. Patient has a history of emphysema.

## 2019-05-07 ENCOUNTER — Observation Stay (HOSPITAL_BASED_OUTPATIENT_CLINIC_OR_DEPARTMENT_OTHER): Payer: No Typology Code available for payment source

## 2019-05-07 DIAGNOSIS — E785 Hyperlipidemia, unspecified: Secondary | ICD-10-CM | POA: Diagnosis not present

## 2019-05-07 DIAGNOSIS — N179 Acute kidney failure, unspecified: Secondary | ICD-10-CM | POA: Diagnosis not present

## 2019-05-07 DIAGNOSIS — R079 Chest pain, unspecified: Secondary | ICD-10-CM

## 2019-05-07 DIAGNOSIS — J432 Centrilobular emphysema: Secondary | ICD-10-CM | POA: Diagnosis not present

## 2019-05-07 DIAGNOSIS — J9611 Chronic respiratory failure with hypoxia: Secondary | ICD-10-CM | POA: Diagnosis not present

## 2019-05-07 DIAGNOSIS — I10 Essential (primary) hypertension: Secondary | ICD-10-CM | POA: Diagnosis not present

## 2019-05-07 LAB — LIPID PANEL
Cholesterol: 218 mg/dL — ABNORMAL HIGH (ref 0–200)
HDL: 59 mg/dL (ref >40)
LDL Cholesterol: 142 mg/dL — ABNORMAL HIGH (ref 0–99)
Total CHOL/HDL Ratio: 3.7 RATIO
Triglycerides: 84 mg/dL (ref <150)
VLDL: 17 mg/dL (ref 0–40)

## 2019-05-07 LAB — SARS CORONAVIRUS 2 (TAT 6-24 HRS): SARS Coronavirus 2: NEGATIVE

## 2019-05-07 LAB — CBC
HCT: 39.4 % (ref 39.0–52.0)
Hemoglobin: 13 g/dL (ref 13.0–17.0)
MCH: 30.9 pg (ref 26.0–34.0)
MCHC: 33 g/dL (ref 30.0–36.0)
MCV: 93.6 fL (ref 80.0–100.0)
Platelets: 199 10*3/uL (ref 150–400)
RBC: 4.21 MIL/uL — ABNORMAL LOW (ref 4.22–5.81)
RDW: 13.8 % (ref 11.5–15.5)
WBC: 4.8 10*3/uL (ref 4.0–10.5)
nRBC: 0 % (ref 0.0–0.2)

## 2019-05-07 LAB — HIV ANTIBODY (ROUTINE TESTING W REFLEX): HIV Screen 4th Generation wRfx: NONREACTIVE

## 2019-05-07 LAB — ECHOCARDIOGRAM COMPLETE
Height: 65.5 in
Weight: 2736 oz

## 2019-05-07 LAB — HEMOGLOBIN A1C
Hgb A1c MFr Bld: 6.1 % — ABNORMAL HIGH (ref 4.8–5.6)
Mean Plasma Glucose: 128.37 mg/dL

## 2019-05-07 MED ORDER — UMECLIDINIUM BROMIDE 62.5 MCG/INH IN AEPB
1.0000 | INHALATION_SPRAY | Freq: Every day | RESPIRATORY_TRACT | Status: DC
  Administered 2019-05-07: 09:00:00 1 via RESPIRATORY_TRACT
  Filled 2019-05-07: qty 7

## 2019-05-07 MED ORDER — ATORVASTATIN CALCIUM 40 MG PO TABS
80.0000 mg | ORAL_TABLET | Freq: Every day | ORAL | Status: DC
  Administered 2019-05-07: 80 mg via ORAL
  Filled 2019-05-07: qty 1

## 2019-05-07 MED ORDER — MIRTAZAPINE 15 MG PO TABS
15.0000 mg | ORAL_TABLET | Freq: Two times a day (BID) | ORAL | Status: DC
  Administered 2019-05-07: 15 mg via ORAL
  Filled 2019-05-07: qty 2

## 2019-05-07 MED ORDER — LIDOCAINE 5 % EX OINT: 1.0000 "application " | TOPICAL_OINTMENT | CUTANEOUS | 0 refills | Status: DC | PRN

## 2019-05-07 MED ORDER — SODIUM CHLORIDE 0.9 % IV SOLN: INTRAVENOUS | Status: DC

## 2019-05-07 MED ORDER — ACETAMINOPHEN 325 MG PO TABS: 650.0000 mg | ORAL_TABLET | ORAL | Status: DC | PRN

## 2019-05-07 MED ORDER — TIOTROPIUM BROMIDE MONOHYDRATE 18 MCG IN CAPS: 18.0000 ug | ORAL_CAPSULE | Freq: Every day | RESPIRATORY_TRACT | Status: DC

## 2019-05-07 MED ORDER — MOMETASONE FURO-FORMOTEROL FUM 200-5 MCG/ACT IN AERO
2.0000 | INHALATION_SPRAY | Freq: Two times a day (BID) | RESPIRATORY_TRACT | Status: DC
  Administered 2019-05-07: 09:00:00 2 via RESPIRATORY_TRACT
  Filled 2019-05-07: qty 8.8

## 2019-05-07 MED ORDER — ASPIRIN 81 MG PO CHEW
81.0000 mg | CHEWABLE_TABLET | Freq: Every day | ORAL | Status: DC
  Administered 2019-05-07: 81 mg via ORAL
  Filled 2019-05-07: qty 1

## 2019-05-07 MED ORDER — METOPROLOL SUCCINATE ER 25 MG PO TB24: 25.0000 mg | ORAL_TABLET | Freq: Every day | ORAL | 1 refills | Status: DC

## 2019-05-07 MED ORDER — FINASTERIDE 5 MG PO TABS
5.0000 mg | ORAL_TABLET | Freq: Every day | ORAL | Status: DC
  Administered 2019-05-07: 5 mg via ORAL
  Filled 2019-05-07: qty 1

## 2019-05-07 MED ORDER — ACETAMINOPHEN 325 MG PO TABS: 650.0000 mg | ORAL_TABLET | Freq: Four times a day (QID) | ORAL | Status: DC | PRN

## 2019-05-07 MED ORDER — ALBUTEROL SULFATE HFA 108 (90 BASE) MCG/ACT IN AERS
2.0000 | INHALATION_SPRAY | Freq: Four times a day (QID) | RESPIRATORY_TRACT | Status: DC | PRN
  Filled 2019-05-07: qty 6.7

## 2019-05-07 MED ORDER — ONDANSETRON HCL 4 MG/2ML IJ SOLN
4.0000 mg | Freq: Four times a day (QID) | INTRAMUSCULAR | Status: DC | PRN
  Filled 2019-05-07: qty 2

## 2019-05-07 MED ORDER — ENOXAPARIN SODIUM 40 MG/0.4ML ~~LOC~~ SOLN
40.0000 mg | SUBCUTANEOUS | Status: DC
  Administered 2019-05-07: 40 mg via SUBCUTANEOUS
  Filled 2019-05-07: qty 0.4

## 2019-05-07 MED ORDER — ALBUTEROL SULFATE (2.5 MG/3ML) 0.083% IN NEBU: 2.5000 mg | INHALATION_SOLUTION | Freq: Four times a day (QID) | RESPIRATORY_TRACT | Status: DC | PRN

## 2019-05-07 NOTE — Consult Note (Addendum)
Cardiology Consultation:   Patient ID: Gerald Williams MRN: [id number]; DOB: 02-Oct-1950  Admit date: 05/06/2019 Date of Consult: 05/07/2019  Primary Care Provider: [name], MD Primary Cardiologist: Chrystie Nose, MD  Primary Electrophysiologist:  None    Patient Profile:   Gerald Williams is a 69 y.o. male with a hx of COPD with chronic respiratory failure, prediabetes, HTN, HLD, OSA on CPAP, and polysubstance abuse who is being seen today for the evaluation of chest pain at the request of Dr. Lajuana Ripple.  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 He suffers from OSA on CPAP, prediabetes, COPD with chronic respiratory failure, and polysubstance abuse. He receives care at the [facility]. He underwent cardiac catheterization following abnormal stress test performed at the [facility] that showed large perfusion defect in the inferior inferoseptal segments. Heart cath by Dr. Tresa Endo 07/15/14 revealed normal coronaries. He has followed with Dr. Rennis Golden and noted to have home O2 requirements and chronic pleuritic pain. He was seen in the ER 04/23/18 and 10/24/18 for atypical chest pain and ruled out for MI both times and discharged. He gets care from both [facility] and the [place].   He presented to [facility] 05/06/19 with left-sided chest pain, and weakness. He states he just had a stress test at the [place] last week.  He states he is not very active.  The most active thing he does is walk around the mall occasionally.  The last time he walks the mall was 2 weeks ago.  He did not have any chest pain at that time.  He states his left-sided chest pain started about 2 days ago.  Chest pain radiates to his left arm.  Chest pain is described as constant.  Chest pain was reproducible with palpation on exam.  No aggravating or relieving factors.  No diaphoresis, shortness of breath, orthopnea, or lower extremity swelling.  He states this is the same chest pain  that he had prior to his heart cath in 2016.    Heart Pathway Score:  HEAR Score: 5  Past Medical History:  Diagnosis Date  . Emphysema   . Erectile dysfunction   . Hyperlipemia   . Hypertension   . Polysubstance abuse (HCC)    stopped using crack/cocaine December 4th 20-14  . PTSD (post-traumatic stress disorder)   . Sleep apnea     Past Surgical History:  Procedure Laterality Date  . CARDIAC CATHETERIZATION    . LEFT HEART CATHETERIZATION WITH CORONARY ANGIOGRAM N/A 07/15/2014   Procedure: LEFT HEART CATHETERIZATION WITH CORONARY ANGIOGRAM;  Surgeon: Lennette Bihari, MD;  Location: [facility] CATH LAB;  Service: Cardiovascular;  Laterality: N/A;     Home Medications:  Prior to Admission medications   Medication Sig Start Date End Date Taking? Authorizing Provider  acetaminophen (TYLENOL) 325 MG tablet Take 650 mg by mouth every 6 (six) hours as needed for moderate pain.    Yes [provider]  albuterol (PROVENTIL HFA;VENTOLIN HFA) 108 (90 BASE) MCG/ACT inhaler Inhale 2 puffs into the lungs every 6 (six) hours as needed for shortness of breath.   Yes [provider]  albuterol (PROVENTIL) (2.5 MG/3ML) 0.083% nebulizer solution Take 2.5 mg by nebulization 2 (two) times daily.    Yes [provider]  aspirin 81 MG tablet Take 81 mg by mouth daily.   Yes [provider]  atorvastatin (LIPITOR) 80 MG tablet Take 80 mg by mouth daily.  Yes [provider]  budesonide-formoterol (SYMBICORT) 160-4.5 MCG/ACT inhaler Inhale 2 puffs into the lungs 2 (two) times daily.   Yes [provider]  cholecalciferol (VITAMIN D3) 25 MCG (1000 UT) tablet Take 1,000 Units by mouth daily.   Yes [provider]  finasteride (PROSCAR) 5 MG tablet Take 5 mg by mouth daily.   Yes [provider]  mirtazapine (REMERON) 15 MG tablet Take 15 mg by mouth 2 (two) times daily.    Yes [provider]  tiotropium (SPIRIVA) 18 MCG inhalation  capsule Place 18 mcg into inhaler and inhale daily.   Yes [provider]  vardenafil (LEVITRA) 20 MG tablet Take 20 mg by mouth daily as needed for erectile dysfunction.   Yes [provider]  glucose blood (COOL BLOOD GLUCOSE TEST STRIPS) test strip Used to test blood sugar x2 daily---diagnosis code r73.03--for one touch verio flex 12/08/15   [name], MD  Lancets MISC Used to test blood sugar x2 daily---diagnosis code r73.03--for one touch verio flex 12/08/15   [name], MD  NON FORMULARY at bedtime. CPAP - followed at [facility]    [provider]  nortriptyline (PAMELOR) 10 MG capsule Take 1 capsule (10 mg total) by mouth at bedtime. Patient not taking: Reported on 05/06/2019 09/23/15   Glendale Chard, DO  OXYGEN 3lpm 24/7  [name] Allegra    [provider]  Polyethyl Glycol-Propyl Glycol 0.4-0.3 % SOLN Apply 2 drops to eye as needed. Patient not taking: Reported on 05/06/2019 08/25/16   Nche, Bonna Gains, NP  UNABLE TO FIND Med Name: CPAP with o2 3lpm with sleep    [provider]  valsartan (DIOVAN) 80 MG tablet Take 1 tablet (80 mg total) by mouth daily. Patient not taking: Reported on 05/06/2019 05/26/15   [name], MD    Inpatient Medications: Scheduled Meds: . aspirin  81 mg Oral Daily  . atorvastatin  80 mg Oral Daily  . enoxaparin (LOVENOX) injection  40 mg Subcutaneous Q24H  . finasteride  5 mg Oral Daily  . metoprolol succinate  25 mg Oral Daily  . mirtazapine  15 mg Oral BID  . mometasone-formoterol  2 puff Inhalation BID  . umeclidinium bromide  1 puff Inhalation Daily   Continuous Infusions:   PRN Meds: acetaminophen, albuterol, morphine injection, nitroGLYCERIN, ondansetron (ZOFRAN) IV, ondansetron (ZOFRAN) IV  Allergies:   No Known Allergies  Social History:   Social History   Socioeconomic History  . Marital status: Married    Spouse name: Not on file  . Number of children: 4  . Years of education: Not on file  .  Highest education level: Not on file  Occupational History  . Occupation: Investment banker, operational - retired  Tobacco Use  . Smoking status: Former Smoker    Packs/day: 1.50    Years: 50.00    Pack years: 75.00    Types: Cigarettes    Start date: 04/26/1963    Quit date: 09/22/2013    Years since quitting: 5.6  . Smokeless tobacco: Never Used  . Tobacco comment: patient using the patch, losenges, and medication  Substance and Sexual Activity  . Alcohol use: No    Alcohol/week: 0.0 standard drinks    Comment: He was drinking 3-4 drinks nightly x 40 years, quit in 2014.    . Drug use: No    Comment: history of crack use and drug tested weekly at the [place]  . Sexual activity: Yes  Other Topics  Concern  . Not on file  Social History Narrative    Education: 14 years and 20 years of [employer]. Recently married.    Social Determinants of Health   Financial Resource Strain:   . Difficulty of Paying Living Expenses: Not on file  Food Insecurity:   . Worried About Charity fundraiser in the Last Year: Not on file  . Ran Out of Food in the Last Year: Not on file  Transportation Needs:   . Lack of Transportation (Medical): Not on file  . Lack of Transportation (Non-Medical): Not on file  Physical Activity:   . Days of Exercise per Week: Not on file  . Minutes of Exercise per Session: Not on file  Stress:   . Feeling of Stress : Not on file  Social Connections:   . Frequency of Communication with Friends and Family: Not on file  . Frequency of Social Gatherings with Friends and Family: Not on file  . Attends Religious Services: Not on file  . Active Member of Clubs or Organizations: Not on file  . Attends Archivist Meetings: Not on file  . Marital Status: Not on file  Intimate Partner Violence:   . Fear of Current or Ex-Partner: Not on file  . Emotionally Abused: Not on file  . Physically Abused: Not on file  . Sexually Abused: Not on file    Family History:    Family History  Problem  Relation Age of Onset  . Diabetes Mother        Deceased  . Heart disease Father        Deceased  . Heart attack Father        x5  . Cancer - Other Sister   . Healthy Son   . Healthy Daughter   . Heart attack Brother        Deceased  . Alcohol abuse Brother        Deceased     ROS:  Please see the history of present illness.   All other ROS reviewed and negative.     Physical Exam/Data:   Vitals:   05/07/19 0630 05/07/19 0700 05/07/19 0730 05/07/19 0800  BP: (!) 133/98 (!) 138/96 136/88 (!) 141/64  Pulse: 60 63 (!) 56 (!) 56  Resp: 16 15 (!) 21 15  Temp:      SpO2: 100% 100% 98% 100%  Weight:      Height:       No intake or output data in the 24 hours ending 05/07/19 0845 Last 3 Weights 05/06/2019 03/11/2019 11/19/2018  Weight (lbs) 171 lb 176 lb 9.6 oz 166 lb 9.6 oz  Weight (kg) 77.565 kg 80.105 kg 75.569 kg     Body mass index is 28.02 kg/m.  General:  Well nourished, well developed, in no acute distress HEENT: normal Lymph: no adenopathy Neck: no JVD Endocrine:  No thryomegaly Vascular: No carotid bruits; FA pulses 2+ bilaterally without bruits  Cardiac:  normal S1, S2; RRR; no murmur, chest wall pain with palpation on the left  Lungs:  Respirations unlabored, breath sounds distant  Abd: soft, nontender, no hepatomegaly  Ext: no edema Musculoskeletal:  No deformities, BUE and BLE strength normal and equal Skin: warm and dry  Neuro:  CNs 2-12 intact, no focal abnormalities noted Psych:  Normal affect   EKG:  The EKG was personally reviewed and demonstrates:  Sinus rhythm HR 74 Telemetry:  Telemetry was personally reviewed and demonstrates:  Sinus rhythm in  the 60-70s, PVCs  Relevant CV Studies:  Stress test [facility] last week - pending records  Laboratory Data:  High Sensitivity Troponin:   Recent Labs  Lab 05/06/19 1620 05/06/19 1810  TROPONINIHS 9 9     Chemistry Recent Labs  Lab 05/06/19 1620  NA 141  K 4.3  CL 106  CO2 28  GLUCOSE 99   BUN 18  CREATININE 1.28*  CALCIUM 9.1  GFRNONAA 58*  GFRAA >60  ANIONGAP 7    Recent Labs  Lab 05/06/19 1620  PROT 7.4  ALBUMIN 4.1  AST 22  ALT 20  ALKPHOS 68  BILITOT 0.9   Hematology Recent Labs  Lab 05/06/19 1620 05/07/19 0433  WBC 5.0 4.8  RBC 4.30 4.21*  HGB 13.0 13.0  HCT 40.3 39.4  MCV 93.7 93.6  MCH 30.2 30.9  MCHC 32.3 33.0  RDW 13.9 13.8  PLT 218 199   BNPNo results for input(s): BNP, PROBNP in the last 168 hours.  DDimer No results for input(s): DDIMER in the last 168 hours.   Radiology/Studies:  DG Chest Portable 1 View  Result Date: 05/06/2019 CLINICAL DATA:  Chest pain. Shortness of breath. Emphysema. EXAM: PORTABLE CHEST 1 VIEW COMPARISON:  10/24/2018 FINDINGS: Numerous leads and wires project over the chest. Midline trachea. Normal heart size and mediastinal contours. No pleural effusion or pneumothorax. Hyperinflation. Linear areas of scarring within both lower lobes. IMPRESSION: 1. No acute cardiopulmonary disease. 2. Hyperinflation, consistent with COPD. Electronically Signed   By: Jeronimo Greaves M.D.   On: 05/06/2019 16:45       HEAR Score (for undifferentiated chest pain):  HEAR Score: 5    Assessment and Plan:   1. Chest pain - hx of cardiac catheterization 2016 with normal coronaries - maintained on ASA, lipitor, and valsartan - BB started this admission - hs troponin x 2 negative  - EKG without clear signs of ischemia - CP is reproducible with palpation on exam - will attempt to obtain stress test results from [place] - if unable to obtain records, may need to repeat nuclear stress test here - keep NPO for now   2. Hypertension - continue valsartan - pressure have been mildly elevated - has not received home ARB   3. Prediabetes - last A1c was 6.2%   4. COPD 5. Chronic respiratory failure - on 3-5 L home O2    6. Hyperlipidemia - 05/07/2019: Cholesterol 218; HDL 59; LDL Cholesterol 142; Triglycerides 84; VLDL 17 - on 80 mg  lipitor - given comorbidities, favor lowering his LDL below 100   7. Mild AKI - sCr 1.28 - appears to be his baseline - will start fluids while NPO      For questions or updates, please contact [facility] Please consult [email or web address] for contact info under     Signed, Marcelino Duster, [place]  05/07/2019 8:45 AM  Patient examined chart reviewed Discussed care with patient and PA. Exam with overweight black male Lungs COPD mild exp wheezing No murmur abdomen soft no edema Troponin negative x 2 No acute ECG changes Pain very atypical with reproduction on palpation. Normal cath 2016 Recent stress test [facility] but regardless patient is clear to  [facility] Non cardiac chest pain   Charlton Haws MD [facility]    ADDENDUM: Records received from [place]. Pt underwent dobutamine myocardial stress test 04/29/19 which showed normal perfusion and normal LV size and function.   OK from a cardiology standpoint  for discharge.   Marcelino Duster, PA-C 05/07/2019, 2:49 PM [phone] [facility] [address] [place], [place] [address]

## 2019-05-07 NOTE — ED Notes (Signed)
[phone] Asher Muir Hearn(daughter) would like to be contacted when patient is transported to [facility]

## 2019-05-07 NOTE — Discharge Summary (Addendum)
Physician Discharge Summary  Gerald Williams NID:[id number] DOB: 1950/08/07 DOA: 05/06/2019  PCP: [name], MD  Admit date: 05/06/2019 Discharge date: 05/07/2019 Consultations: Cardiology Admitted From: home Disposition: home  Discharge Diagnoses:  Principal Problem:   Chest pain Active Problems:   COPD (chronic obstructive pulmonary disease) with emphysema (HCC)   HTN (hypertension)   Chronic respiratory failure with hypoxia [facility])  Hospital Course Summary: 69 year old [place] veteran with history of COPD, home O2 dependent-4lit, hypertension, hyperlipidemia, OSA on CPAP, CKD stage I, prediabetes, polysubstance abuse [facility] on 1/11 with complaints of chest pain x2 weeks.  He had a recent stress test at [facility], results unknown. ED Course: Afebrile, saturating well on 4 L home O2, elevated blood pressure 165/110.  EKG with sinus rhythm/PAC.  Chest x-ray unremarkable.  CBC/BMP unremarkable, baseline creatinine around 1.3. Hospital course: Patient admitted to [facility] for rule out MI and cardiology evaluation.  Serial cardiac enzymes overnight flat ( HS troponin 9-->9).  Echo ordered.  Evaluated by cardiology who felt chest pain was non cardiac, reproducible atypical pain. [facility]ful in [facility] which indicated that Pt underwent dobutamine myocardial stress test 04/29/19 which showed normal perfusion and normal LV size and function . Cardiology cleared patient for discharge. Patient may resume home meds (on asa ), added B-blockers for BP control --BP improved to systolic 120-140s prior to discharge. Recommend symptomatic management for musculoskeletal chest pain, avoid NSAIDs given CKD -acetaminophen/heating pads/lidocaine topical advised. Discharge orders placed  Discharge Exam:  Vitals:   05/07/19 1600 05/07/19 1724  BP: (!) 142/89 (!) 148/90  Pulse: (!) 55 69  Resp: 16   Temp:  97.8 F (36.6 C)  SpO2: 93% 100%   Vitals:    05/07/19 1451 05/07/19 1530 05/07/19 1600 05/07/19 1724  BP: 127/72 (!) 152/84 (!) 142/89 (!) 148/90  Pulse: 85 (!) 51 (!) 55 69  Resp: 20 13 16    Temp:    97.8 F (36.6 C)  TempSrc:    Oral  SpO2: 96% 94% 93% 100%  Weight:    77.8 kg  Height:    5\' 5"  (1.651 m)    General: Pt is alert, awake, not in acute distress Cardiovascular: RRR, S1/S2 +, no rubs, no gallops Respiratory: CTA bilaterally, no wheezing, no rhonchi Abdominal: Soft, NT, ND, bowel sounds + Extremities: no edema, no cyanosis  Discharge Condition:Stable CODE STATUS: Full code Diet recommendation: low salt diet Recommendations for Outpatient Follow-up:  1. Follow up with PCP: 1 week 2. Follow [facility] with [facility]    Discharge Instructions:  Discharge Instructions    Call MD for:  difficulty breathing, headache or visual disturbances   Complete by: As directed    Call MD for:  persistant nausea and vomiting   Complete by: As directed    Call MD for:  severe uncontrolled pain   Complete by: As directed    Call MD for:  temperature >100.4   Complete by: As directed    Diet - low sodium heart healthy   Complete by: As directed    Diet Carb Modified   Complete by: As directed    Increase activity slowly   Complete by: As directed      Allergies as of 05/07/2019   No Known Allergies     Medication List    TAKE these medications   acetaminophen 325 MG tablet Commonly known as: TYLENOL Take 650 mg by mouth  every 6 (six) hours as needed for moderate pain.   albuterol (2.5 MG/3ML) 0.083% nebulizer solution Commonly known as: PROVENTIL Take 2.5 mg by nebulization 2 (two) times daily.   albuterol 108 (90 Base) MCG/ACT inhaler Commonly known as: VENTOLIN HFA Inhale 2 puffs into the lungs every 6 (six) hours as needed for shortness of breath.   aspirin 81 MG tablet Take 81 mg by mouth daily.   atorvastatin 80 MG tablet Commonly known as: LIPITOR Take 80 mg by mouth  daily.   budesonide-formoterol 160-4.5 MCG/ACT inhaler Commonly known as: SYMBICORT Inhale 2 puffs into the lungs 2 (two) times daily.   cholecalciferol 25 MCG (1000 UNIT) tablet Commonly known as: VITAMIN D3 Take 1,000 Units by mouth daily.   finasteride 5 MG tablet Commonly known as: PROSCAR Take 5 mg by mouth daily.   glucose blood test strip Commonly known as: Cool Blood Glucose Test Strips Used to test blood sugar x2 daily---diagnosis code r73.03--for one touch verio flex   Lancets Misc Used to test blood sugar x2 daily---diagnosis code r73.03--for one touch verio flex   lidocaine 5 % ointment Commonly known as: XYLOCAINE Apply 1 application topically as needed. To left lateral chest wall   metoprolol succinate 25 MG 24 hr tablet Commonly known as: TOPROL-XL Take 1 tablet (25 mg total) by mouth daily. Start taking on: May 08, 2019   mirtazapine 15 MG tablet Commonly known as: REMERON Take 15 mg by mouth 2 (two) times daily.   NON FORMULARY at bedtime. CPAP - followed at [facility]   nortriptyline 10 MG capsule Commonly known as: PAMELOR Take 1 capsule (10 mg total) by mouth at bedtime.   OXYGEN 3lpm 24/7  [facility]   Polyethyl Glycol-Propyl Glycol 0.4-0.3 % Soln Apply 2 drops to eye as needed.   tiotropium 18 MCG inhalation capsule Commonly known as: SPIRIVA Place 18 mcg into inhaler and inhale daily.   UNABLE TO FIND Med Name: CPAP with o2 3lpm with sleep   valsartan 80 MG tablet Commonly known as: Diovan Take 1 tablet (80 mg total) by mouth daily.   vardenafil 20 MG tablet Commonly known as: LEVITRA Take 20 mg by mouth daily as needed for erectile dysfunction.      Follow-up Information    Schedule an appointment as soon as possible for a visit  with Binnie Rail, MD.   Specialty: Internal Medicine Contact information: [place] [address] [phone]          No Known Allergies    The results of significant  diagnostics from this hospitalization (including imaging, microbiology, ancillary and laboratory) are listed below for reference.    Labs: BNP (last 3 results) No results for input(s): BNP in the last 8760 hours. Basic Metabolic Panel: Recent Labs  Lab 05/06/19 1620  NA 141  K 4.3  CL 106  CO2 28  GLUCOSE 99  BUN 18  CREATININE 1.28*  CALCIUM 9.1   Liver Function Tests: Recent Labs  Lab 05/06/19 1620  AST 22  ALT 20  ALKPHOS 68  BILITOT 0.9  PROT 7.4  ALBUMIN 4.1   No results for input(s): LIPASE, AMYLASE in the last 168 hours. No results for input(s): AMMONIA in the last 168 hours. CBC: Recent Labs  Lab 05/06/19 1620 05/07/19 0433  WBC 5.0 4.8  NEUTROABS 2.3  --   HGB 13.0 13.0  HCT 40.3 39.4  MCV 93.7 93.6  PLT 218 199   Cardiac Enzymes: No results  for input(s): CKTOTAL, CKMB, CKMBINDEX, TROPONINI in the last 168 hours. BNP: Invalid input(s): POCBNP CBG: No results for input(s): GLUCAP in the last 168 hours. D-Dimer No results for input(s): DDIMER in the last 72 hours. Hgb A1c Recent Labs    05/07/19 0432  HGBA1C 6.1*   Lipid Profile Recent Labs    05/07/19 0432  CHOL 218*  HDL 59  LDLCALC 142*  TRIG 84  CHOLHDL 3.7   Thyroid function studies No results for input(s): TSH, T4TOTAL, T3FREE, THYROIDAB in the last 72 hours.  Invalid input(s): FREET3 Anemia work up No results for input(s): VITAMINB12, FOLATE, FERRITIN, TIBC, IRON, RETICCTPCT in the last 72 hours. Urinalysis    Component Value Date/Time   COLORURINE YELLOW 01/06/2013 1204   APPEARANCEUR CLEAR 01/06/2013 1204   LABSPEC 1.017 01/06/2013 1204   PHURINE 5.5 01/06/2013 1204   GLUCOSEU NEGATIVE 01/06/2013 1204   HGBUR NEGATIVE 01/06/2013 1204   BILIRUBINUR NEGATIVE 01/06/2013 1204   KETONESUR NEGATIVE 01/06/2013 1204   PROTEINUR NEGATIVE 01/06/2013 1204   UROBILINOGEN 0.2 01/06/2013 1204   NITRITE NEGATIVE 01/06/2013 1204   LEUKOCYTESUR NEGATIVE 01/06/2013 1204   Sepsis  Labs Invalid input(s): PROCALCITONIN,  WBC,  LACTICIDVEN Microbiology Recent Results (from the past 240 hour(s))  SARS CORONAVIRUS 2 (TAT 6-24 HRS) Nasopharyngeal Nasopharyngeal Swab     Status: None   Collection Time: 05/06/19  9:46 PM   Specimen: Nasopharyngeal Swab  Result Value Ref Range Status   SARS Coronavirus 2 NEGATIVE NEGATIVE Final    Comment: (NOTE) SARS-CoV-2 target nucleic acids are NOT DETECTED. The SARS-CoV-2 RNA is generally detectable in upper and lower respiratory specimens during the acute phase of infection. Negative results do not preclude SARS-CoV-2 infection, do not rule out co-infections with other pathogens, and should not be used as the sole basis for treatment or other patient management decisions. Negative results must be combined with clinical observations, patient history, and epidemiological information. The expected result is Negative. Fact Sheet for Patients: HairSlick.no Fact Sheet for Healthcare Providers: quierodirigir.com This test is not yet approved or cleared by the [place] FDA and  has been authorized for detection and/or diagnosis of SARS-CoV-2 by FDA under an Emergency Use Authorization (EUA). This EUA will remain  in effect (meaning this test can be used) for the duration of the COVID-19 declaration under Section 56 4(b)(1) of the Act, 21 U.S.C. section 360bbb-3(b)(1), unless the authorization is terminated or revoked sooner. Performed at [facility], [address] [place], [place] [address]     Procedures/Studies: DG Chest Portable 1 View  Result Date: 05/06/2019 CLINICAL DATA:  Chest pain. Shortness of breath. Emphysema. EXAM: PORTABLE CHEST 1 VIEW COMPARISON:  10/24/2018 FINDINGS: Numerous leads and wires project over the chest. Midline trachea. Normal heart size and mediastinal contours. No pleural effusion or pneumothorax. Hyperinflation. Linear areas of scarring  within both lower lobes. IMPRESSION: 1. No acute cardiopulmonary disease. 2. Hyperinflation, consistent with COPD. Electronically Signed   By: Jeronimo Greaves M.D.   On: 05/06/2019 16:45   ECHOCARDIOGRAM COMPLETE  Result Date: 05/07/2019   ECHOCARDIOGRAM REPORT   Patient Name:   Gerald Williams Date of Exam: 05/07/2019 Medical Rec #:  [id number]      Height:       65.5 in Accession #:    [id number]     Weight:       171.0 lb Date of Birth:  Jun 05, 1950      BSA:  1.86 m Patient Age:    68 years       BP:           158/96 mmHg Patient Gender: M              HR:           62 bpm. Exam Location:  Inpatient Procedure: 2D Echo, Cardiac Doppler and Color Doppler Indications:    Chest pain  History:        Patient has no prior history of Echocardiogram examinations.                 COPD; Risk Factors:Hypertension, Diabetes and Dyslipidemia.                 Polysubstance abuse, resp. failure.  Sonographer:    Lavenia Atlas Referring Phys: [id number] Alessandra Bevels IMPRESSIONS  1. Left ventricular ejection fraction, by visual estimation, is 50 to 55%. The left ventricle has low normal function. There is no left ventricular hypertrophy.  2. Left ventricular diastolic parameters are consistent with Grade I diastolic dysfunction (impaired relaxation).  3. The left ventricle has no regional wall motion abnormalities.  4. Global right ventricle has normal systolic function.The right ventricular size is normal. No increase in right ventricular wall thickness.  5. Left atrial size was normal.  6. Right atrial size was normal.  7. The mitral valve is normal in structure. No evidence of mitral valve regurgitation. No evidence of mitral stenosis.  8. The tricuspid valve is normal in structure.  9. The aortic valve is normal in structure. Aortic valve regurgitation is not visualized. No evidence of aortic valve sclerosis or stenosis. 10. The pulmonic valve was normal in structure. Pulmonic valve regurgitation is not visualized.  11. The inferior vena cava is normal in size with greater than 50% respiratory variability, suggesting right atrial pressure of 3 mmHg. FINDINGS  Left Ventricle: Left ventricular ejection fraction, by visual estimation, is 50 to 55%. The left ventricle has low normal function. The left ventricle has no regional wall motion abnormalities. There is no left ventricular hypertrophy. Left ventricular diastolic parameters are consistent with Grade I diastolic dysfunction (impaired relaxation). Normal left atrial pressure. Right Ventricle: The right ventricular size is normal. No increase in right ventricular wall thickness. Global RV systolic function is has normal systolic function. Left Atrium: Left atrial size was normal in size. Right Atrium: Right atrial size was normal in size Pericardium: There is no evidence of pericardial effusion. Mitral Valve: The mitral valve is normal in structure. No evidence of mitral valve regurgitation. No evidence of mitral valve stenosis by observation. Tricuspid Valve: The tricuspid valve is normal in structure. Tricuspid valve regurgitation is not demonstrated. Aortic Valve: The aortic valve is normal in structure. Aortic valve regurgitation is not visualized. The aortic valve is structurally normal, with no evidence of sclerosis or stenosis. Pulmonic Valve: The pulmonic valve was normal in structure. Pulmonic valve regurgitation is not visualized. Pulmonic regurgitation is not visualized. Aorta: The aortic root, ascending aorta and aortic arch are all structurally normal, with no evidence of dilitation or obstruction. Venous: The inferior vena cava is normal in size with greater than 50% respiratory variability, suggesting right atrial pressure of 3 mmHg. IAS/Shunts: No atrial level shunt detected by color flow Doppler. There is no evidence of a patent foramen ovale. No ventricular septal defect is seen or detected. There is no evidence of an atrial septal defect.  LEFT VENTRICLE  PLAX 2D LVIDd:  3.84 cm  Diastology LVIDs:         2.92 cm  LV e' lateral:   3.81 cm/s LV PW:         1.19 cm  LV E/e' lateral: 9.8 LV IVS:        1.15 cm  LV e' medial:    4.35 cm/s LVOT diam:     2.30 cm  LV E/e' medial:  8.6 LV SV:         31 ml LV SV Index:   16.08 LVOT Area:     4.15 cm  RIGHT VENTRICLE RV S prime:     11.60 cm/s TAPSE (M-mode): 2.4 cm LEFT ATRIUM             Index       RIGHT ATRIUM           Index LA diam:        3.60 cm 1.93 cm/m  RA Area:     14.00 cm LA Vol (A2C):   32.0 ml 17.19 ml/m RA Volume:   39.20 ml  21.06 ml/m LA Vol (A4C):   41.2 ml 22.14 ml/m LA Biplane Vol: 37.5 ml 20.15 ml/m  AORTIC VALVE LVOT Vmax:   88.70 cm/s LVOT Vmean:  51.600 cm/s LVOT VTI:    0.184 m  AORTA Ao Root diam: 3.30 cm MITRAL VALVE MV Area (PHT): 1.62 cm             SHUNTS MV PHT:        135.86 msec          Systemic VTI:  0.18 m MV Decel Time: 469 msec             Systemic Diam: 2.30 cm MV E velocity: 37.35 cm/s 103 cm/s MV A velocity: 60.60 cm/s 70.3 cm/s MV E/A ratio:  0.62       1.5  Mihai Croitoru MD Electronically signed by Thurmon Fair MD Signature Date/Time: 05/07/2019/4:56:48 PM    Final     Time coordinating discharge: Over 30 minutes  SIGNED:   Alessandra Bevels, MD  [facility] 05/07/2019, 5:27 PM Pager : [phone]

## 2019-05-07 NOTE — Progress Notes (Signed)
  Echocardiogram 2D Echocardiogram has been performed.  Gerald Williams 05/07/2019, 11:33 AM

## 2019-05-08 ENCOUNTER — Telehealth: Payer: Self-pay | Admitting: *Deleted

## 2019-05-08 ENCOUNTER — Telehealth: Payer: Self-pay | Admitting: Emergency Medicine

## 2019-05-08 NOTE — Telephone Encounter (Signed)
Tried calling pt there was no answer LMOM RTC../lmb 

## 2019-05-09 NOTE — Telephone Encounter (Signed)
Tried calling pt again still no answer LMOM RTC. ([facility] not sure if he still see Dr. Lawerance Bach will update if pt return call back)../lm,b

## 2019-05-17 DIAGNOSIS — R0902 Hypoxemia: Secondary | ICD-10-CM | POA: Diagnosis not present

## 2019-05-17 DIAGNOSIS — J432 Centrilobular emphysema: Secondary | ICD-10-CM | POA: Diagnosis not present

## 2019-05-19 ENCOUNTER — Ambulatory Visit: Payer: No Typology Code available for payment source | Attending: Internal Medicine

## 2019-05-19 DIAGNOSIS — Z23 Encounter for immunization: Secondary | ICD-10-CM | POA: Insufficient documentation

## 2019-05-19 NOTE — Progress Notes (Signed)
   Covid-19 Vaccination Clinic  Name:  Colen Eltzroth    MRN: [id number] DOB: 14-Aug-1950  05/19/2019  Mr. Fatzinger was observed post Covid-19 immunization for 15 minutes without incidence. He was provided with Vaccine Information Sheet and instruction to access the V-Safe system.   Mr. Trippe was instructed to call 911 with any severe reactions post vaccine: Marland Kitchen Difficulty breathing  . Swelling of your face and throat  . A fast heartbeat  . A bad rash all over your body  . Dizziness and weakness    Immunizations Administered    Name Date Dose VIS Date Route   Moderna COVID-19 Vaccine 05/19/2019  1:51 PM 0.5 mL 03/26/2019 Intramuscular   Manufacturer: Moderna   Lot: [id number]   NDC: 90689-340-68

## 2019-05-20 ENCOUNTER — Telehealth: Payer: Self-pay | Admitting: Internal Medicine

## 2019-05-20 NOTE — Telephone Encounter (Signed)
Called spoke with patient who reported that he currently has home O2 with [name] (order was placed to change DME to [facility] 02/2019 but pt stated this did not go through bc [facility] did not carry the portable system he wanted).  Patient stated that his home concentrator is not working properly and he is dissatisfied with [facility]'s customer service and would like to switch to [facility].  Patient stated that he has already spoken with his insurance who reported there should not be an issue with changing.  Advised patient that with switching DME companies, the new company will need an office visit and a whole new prescription with a walk.  Patient unable to come to the office until Wednesday - appt scheduled with Beth NP for 0930.  C-19 screening negative, pt received his first Covid vaccine last week.  Nothing further needed at this time; will sign off.

## 2019-05-21 NOTE — Progress Notes (Signed)
@Patient  ID: , male    DOB: April 27, 1950, 69 y.o.   MRN: [id number]  Chief Complaint  Patient presents with  . Follow-up    Brief patient profile:  68 yobm quit smoking 08/2013 (pt says 2010)  GOLD III/ group D symptoms  with  better on trelegy vs symbicort with less need for saba but concerned about cost of non-VA meds.     Referring provider: 2011, MD  HPI: 69 year old male, former smoker quit 2015. PMH significant for COPD GOLD III, OSA on CPAP, chronic respiratory failure with hypoxia (o2 dependent), HTN, dyslipidemia, PTSD. Patient of Dr. 2016, last seen on 03/11/19. Maintained on Symb/spiriva and 4L oxygen.  Patient was admitted for chest pain from 1/11- 05/07/19, chest pain felt to be non-cardiac. Stress test normal. Toprol XL changed to 50mg  daily.    05/22/2019 Patient presents today for qualifying walk for oxygen. States that his home concentrator is not working properly and he is dissatisfied with current DME company that supplies his oxygen which is . He would like to switch to [facility]. He is in his normal health today. Breathing is baseline. He remains very physically active, needs portable oxygen tanks for enhanced quality of life. He is currently only taking Symbicort 2 puffs in the morning and Spiriva. States that he doesn't use ICS/LABA twice daily because of the steroid and he doesn't want to put on additional weight. Using Nebulizer twice a day; albuterol rescue inhaler once daily.  Testing reviewed: Spirometry: 11/19/2018  FEV1 1.00 (36%)  Ratio 0.49  With no response  To saba on ? Prior to test  Cardiac:  04/29/19 dobutamine myocardia stress test- normal perfusion and normal LV sizer and function   No Known Allergies  Immunization History  Administered Date(s) Administered  . Fluad Quad(high Dose 65+) 02/18/2019  . Influenza Split 01/18/2018  . Influenza,inj,Quad PF,6+ Mos 01/11/2016  . Influenza-Unspecified 01/03/2015, 12/24/2016  .  Moderna SARS-COVID-2 Vaccination 05/19/2019    Past Medical History:  Diagnosis Date  . Emphysema   . Erectile dysfunction   . Hyperlipemia   . Hypertension   . Polysubstance abuse (HCC)    stopped using crack/cocaine December 4th 20-14  . PTSD (post-traumatic stress disorder)   . Sleep apnea     Tobacco History: Social History   Tobacco Use  Smoking Status Former Smoker  . Packs/day: 1.50  . Years: 50.00  . Pack years: 75.00  . Types: Cigarettes  . Start date: 04/26/1963  . Quit date: 09/22/2013  . Years since quitting: 5.6  Smokeless Tobacco Never Used  Tobacco Comment   patient using the patch, losenges, and medication   Counseling given: Not Answered Comment: patient using the patch, losenges, and medication   Outpatient Medications Prior to Visit  Medication Sig Dispense Refill  . acetaminophen (TYLENOL) 325 MG tablet Take 650 mg by mouth every 6 (six) hours as needed for moderate pain.     06/24/1963 albuterol (PROVENTIL HFA;VENTOLIN HFA) 108 (90 BASE) MCG/ACT inhaler Inhale 2 puffs into the lungs every 6 (six) hours as needed for shortness of breath.    09/24/2013 albuterol (PROVENTIL) (2.5 MG/3ML) 0.083% nebulizer solution Take 2.5 mg by nebulization 2 (two) times daily.     Marland Kitchen aspirin 81 MG tablet Take 81 mg by mouth daily.    Marland Kitchen atorvastatin (LIPITOR) 80 MG tablet Take 80 mg by mouth daily.    . budesonide-formoterol (SYMBICORT) 160-4.5 MCG/ACT inhaler Inhale 2 puffs into the lungs 2 (  two) times daily.    . cholecalciferol (VITAMIN D3) 25 MCG (1000 UT) tablet Take 1,000 Units by mouth daily.    . finasteride (PROSCAR) 5 MG tablet Take 5 mg by mouth daily.    Marland Kitchen glucose blood (COOL BLOOD GLUCOSE TEST STRIPS) test strip Used to test blood sugar x2 daily---diagnosis code r73.03--for one touch verio flex 200 each 3  . Lancets MISC Used to test blood sugar x2 daily---diagnosis code r73.03--for one touch verio flex 200 each 3  . lidocaine (XYLOCAINE) 5 % ointment Apply 1 application  topically as needed. To left lateral chest wall 35.44 g 0  . metoprolol succinate (TOPROL-XL) 50 MG 24 hr tablet Take 50 mg by mouth daily. Take with or immediately following a meal.    . mirtazapine (REMERON) 15 MG tablet Take 15 mg by mouth 2 (two) times daily.     . NON FORMULARY at bedtime. CPAP - followed at [facility]    . nortriptyline (PAMELOR) 10 MG capsule Take 1 capsule (10 mg total) by mouth at bedtime. 30 capsule 5  . OXYGEN 3lpm 24/7  [facility]    . Polyethyl Glycol-Propyl Glycol 0.4-0.3 % SOLN Apply 2 drops to eye as needed. 15 mL 0  . tiotropium (SPIRIVA) 18 MCG inhalation capsule Place 18 mcg into inhaler and inhale daily.    Marland Kitchen UNABLE TO FIND Med Name: CPAP with o2 3lpm with sleep    . valsartan (DIOVAN) 80 MG tablet Take 1 tablet (80 mg total) by mouth daily. 30 tablet 3  . vardenafil (LEVITRA) 20 MG tablet Take 20 mg by mouth daily as needed for erectile dysfunction.    . metoprolol succinate (TOPROL-XL) 25 MG 24 hr tablet Take 1 tablet (25 mg total) by mouth daily. 30 tablet 1   No facility-administered medications prior to visit.   Review of Systems  Review of Systems  Constitutional: Negative.   Respiratory: Negative for cough, shortness of breath and wheezing.   Cardiovascular: Negative.    Physical Exam  BP 126/78 (BP Location: Left Arm, Cuff Size: Normal)   Pulse 83   Temp 98.4 F (36.9 C) (Temporal)   Ht 5' 5.5" (1.664 m)   Wt 175 lb 9.6 oz (79.7 kg)   SpO2 96% Comment: 3 liters pulse  BMI 28.78 kg/m  Physical Exam Constitutional:      Appearance: Normal appearance. He is not ill-appearing.     Comments: Well appearing  HENT:     Head: Normocephalic and atraumatic.     Mouth/Throat:     Mouth: Mucous membranes are moist.     Pharynx: Oropharynx is clear.  Cardiovascular:     Rate and Rhythm: Normal rate and regular rhythm.  Pulmonary:     Effort: Pulmonary effort is normal. No respiratory distress.     Breath sounds: Normal breath sounds. No stridor. No  wheezing.     Comments: CTA, diminished. 4L pulsed oxygen Musculoskeletal:        General: Normal range of motion.     Comments: Normal gait   Skin:    General: Skin is warm and dry.  Neurological:     General: No focal deficit present.     Mental Status: He is alert and oriented to person, place, and time. Mental status is at baseline.  Psychiatric:        Mood and Affect: Mood normal.        Behavior: Behavior normal.        Thought Content: Thought  content normal.        Judgment: Judgment normal.      Lab Results:  CBC    Component Value Date/Time   WBC 4.8 05/07/2019 0433   RBC 4.21 (L) 05/07/2019 0433   HGB 13.0 05/07/2019 0433   HCT 39.4 05/07/2019 0433   PLT 199 05/07/2019 0433   MCV 93.6 05/07/2019 0433   MCH 30.9 05/07/2019 0433   MCHC 33.0 05/07/2019 0433   RDW 13.8 05/07/2019 0433   LYMPHSABS 2.0 05/06/2019 1620   MONOABS 0.5 05/06/2019 1620   EOSABS 0.2 05/06/2019 1620   BASOSABS 0.0 05/06/2019 1620    BMET    Component Value Date/Time   NA 141 05/06/2019 1620   K 4.3 05/06/2019 1620   CL 106 05/06/2019 1620   CO2 28 05/06/2019 1620   GLUCOSE 99 05/06/2019 1620   BUN 18 05/06/2019 1620   CREATININE 1.28 (H) 05/06/2019 1620   CALCIUM 9.1 05/06/2019 1620   GFRNONAA 58 (L) 05/06/2019 1620   GFRAA >60 05/06/2019 1620    BNP No results found for: BNP  ProBNP    Component Value Date/Time   PROBNP <30.0 05/26/2010 1120    Imaging: DG Chest Portable 1 View  Result Date: 05/06/2019 CLINICAL DATA:  Chest pain. Shortness of breath. Emphysema. EXAM: PORTABLE CHEST 1 VIEW COMPARISON:  10/24/2018 FINDINGS: Numerous leads and wires project over the chest. Midline trachea. Normal heart size and mediastinal contours. No pleural effusion or pneumothorax. Hyperinflation. Linear areas of scarring within both lower lobes. IMPRESSION: 1. No acute cardiopulmonary disease. 2. Hyperinflation, consistent with COPD. Electronically Signed   By: Jeronimo Greaves M.D.    On: 05/06/2019 16:45   ECHOCARDIOGRAM COMPLETE  Result Date: 05/07/2019   ECHOCARDIOGRAM REPORT   Patient Name:   Gerald Williams Date of Exam: 05/07/2019 Medical Rec #:  [id number]      Height:       65.5 in Accession #:    [id number]     Weight:       171.0 lb Date of Birth:  Mar 11, 1951      BSA:          1.86 m Patient Age:    68 years       BP:           158/96 mmHg Patient Gender: M              HR:           62 bpm. Exam Location:  Inpatient Procedure: 2D Echo, Cardiac Doppler and Color Doppler Indications:    Chest pain  History:        Patient has no prior history of Echocardiogram examinations.                 COPD; Risk Factors:Hypertension, Diabetes and Dyslipidemia.                 Polysubstance abuse, resp. failure.  Sonographer:    Lavenia Atlas Referring Phys: [id number] Alessandra Bevels IMPRESSIONS  1. Left ventricular ejection fraction, by visual estimation, is 50 to 55%. The left ventricle has low normal function. There is no left ventricular hypertrophy.  2. Left ventricular diastolic parameters are consistent with Grade I diastolic dysfunction (impaired relaxation).  3. The left ventricle has no regional wall motion abnormalities.  4. Global right ventricle has normal systolic function.The right ventricular size is normal. No increase in right ventricular wall thickness.  5. Left atrial size was normal.  6. Right  atrial size was normal.  7. The mitral valve is normal in structure. No evidence of mitral valve regurgitation. No evidence of mitral stenosis.  8. The tricuspid valve is normal in structure.  9. The aortic valve is normal in structure. Aortic valve regurgitation is not visualized. No evidence of aortic valve sclerosis or stenosis. 10. The pulmonic valve was normal in structure. Pulmonic valve regurgitation is not visualized. 11. The inferior vena cava is normal in size with greater than 50% respiratory variability, suggesting right atrial pressure of 3 mmHg. FINDINGS  Left Ventricle:  Left ventricular ejection fraction, by visual estimation, is 50 to 55%. The left ventricle has low normal function. The left ventricle has no regional wall motion abnormalities. There is no left ventricular hypertrophy. Left ventricular diastolic parameters are consistent with Grade I diastolic dysfunction (impaired relaxation). Normal left atrial pressure. Right Ventricle: The right ventricular size is normal. No increase in right ventricular wall thickness. Global RV systolic function is has normal systolic function. Left Atrium: Left atrial size was normal in size. Right Atrium: Right atrial size was normal in size Pericardium: There is no evidence of pericardial effusion. Mitral Valve: The mitral valve is normal in structure. No evidence of mitral valve regurgitation. No evidence of mitral valve stenosis by observation. Tricuspid Valve: The tricuspid valve is normal in structure. Tricuspid valve regurgitation is not demonstrated. Aortic Valve: The aortic valve is normal in structure. Aortic valve regurgitation is not visualized. The aortic valve is structurally normal, with no evidence of sclerosis or stenosis. Pulmonic Valve: The pulmonic valve was normal in structure. Pulmonic valve regurgitation is not visualized. Pulmonic regurgitation is not visualized. Aorta: The aortic root, ascending aorta and aortic arch are all structurally normal, with no evidence of dilitation or obstruction. Venous: The inferior vena cava is normal in size with greater than 50% respiratory variability, suggesting right atrial pressure of 3 mmHg. IAS/Shunts: No atrial level shunt detected by color flow Doppler. There is no evidence of a patent foramen ovale. No ventricular septal defect is seen or detected. There is no evidence of an atrial septal defect.  LEFT VENTRICLE PLAX 2D LVIDd:         3.84 cm  Diastology LVIDs:         2.92 cm  LV e' lateral:   3.81 cm/s LV PW:         1.19 cm  LV E/e' lateral: 9.8 LV IVS:        1.15 cm  LV  e' medial:    4.35 cm/s LVOT diam:     2.30 cm  LV E/e' medial:  8.6 LV SV:         31 ml LV SV Index:   16.08 LVOT Area:     4.15 cm  RIGHT VENTRICLE RV S prime:     11.60 cm/s TAPSE (M-mode): 2.4 cm LEFT ATRIUM             Index       RIGHT ATRIUM           Index LA diam:        3.60 cm 1.93 cm/m  RA Area:     14.00 cm LA Vol (A2C):   32.0 ml 17.19 ml/m RA Volume:   39.20 ml  21.06 ml/m LA Vol (A4C):   41.2 ml 22.14 ml/m LA Biplane Vol: 37.5 ml 20.15 ml/m  AORTIC VALVE LVOT Vmax:   88.70 cm/s LVOT Vmean:  51.600 cm/s LVOT VTI:  0.184 m  AORTA Ao Root diam: 3.30 cm MITRAL VALVE MV Area (PHT): 1.62 cm             SHUNTS MV PHT:        135.86 msec          Systemic VTI:  0.18 m MV Decel Time: 469 msec             Systemic Diam: 2.30 cm MV E velocity: 37.35 cm/s 103 cm/s MV A velocity: 60.60 cm/s 70.3 cm/s MV E/A ratio:  0.62       1.5  Mihai Croitoru MD Electronically signed by Sanda Klein MD Signature Date/Time: 05/07/2019/4:56:48 PM    Final      Assessment & Plan:   Chronic respiratory failure with hypoxia ([place]) - Patient here today for Qualifying walk 05/22/2019 88% on 3L - He would like to change Oxygen companies from [place] to [employer]  - Needs 4L to keep O2 > 89%  - New order placed for oxygen with [facility]   COPD GOLD III - Stable; no acute respiratory symptoms - Reinforced Symbicort 160 is TWICE DAILY dosing  - Continue Spiriva HandiHaler once daily   - Albuterol hfa/neb q 6 hours for breakthrough sob/wheezing    Martyn Ehrich, NP 05/22/2019

## 2019-05-22 ENCOUNTER — Other Ambulatory Visit: Payer: Self-pay

## 2019-05-22 ENCOUNTER — Telehealth: Payer: Self-pay | Admitting: Primary Care

## 2019-05-22 ENCOUNTER — Encounter: Payer: Self-pay | Admitting: Primary Care

## 2019-05-22 ENCOUNTER — Ambulatory Visit: Payer: Medicare Other | Admitting: Primary Care

## 2019-05-22 VITALS — BP 126/78 | HR 83 | Temp 98.4°F | Ht 65.5 in | Wt 175.6 lb

## 2019-05-22 DIAGNOSIS — J449 Chronic obstructive pulmonary disease, unspecified: Secondary | ICD-10-CM

## 2019-05-22 DIAGNOSIS — J9611 Chronic respiratory failure with hypoxia: Secondary | ICD-10-CM

## 2019-05-22 NOTE — Patient Instructions (Addendum)
Pleasure seeing you today Gerald Williams  Recommendations: - Please use 4L oxygen on exertion - Titrate O2 to keep >88-90%  Orders: - Change DME company to [facility] to supply home oxygen (needs home concentrator, tanks and supplies)  Follow-up: - Due for regular visit with Dr. Sherene Sires in February-March

## 2019-05-22 NOTE — Assessment & Plan Note (Signed)
-   Stable; no acute respiratory symptoms - Reinforced Symbicort 160 is TWICE DAILY dosing  - Continue Spiriva HandiHaler once daily   - Albuterol hfa/neb q 6 hours for breakthrough sob/wheezing

## 2019-05-22 NOTE — Telephone Encounter (Signed)
Spoke with patient. He was calling to check on the status of his O2 order from today. He called [facility] to see if they had the order and they told him they did not have the order. I advised him that the order had been sent to [facility] this afternoon around 415pm. He stated that he had called them too soon and would follow up tomorrow. Advised him that if he does not hear from [facility] by tomorrow to call us back. He verbalized understanding.  Nothing further needed at time of call.

## 2019-05-22 NOTE — Assessment & Plan Note (Signed)
-   Patient here today for Qualifying walk 05/22/2019 88% on 3L - He would like to change Oxygen companies from [employer] to [employer]  - Needs 4L to keep O2 > 89%  - New order placed for oxygen with [facility]

## 2019-05-23 DIAGNOSIS — J449 Chronic obstructive pulmonary disease, unspecified: Secondary | ICD-10-CM | POA: Diagnosis not present

## 2019-06-16 ENCOUNTER — Ambulatory Visit: Payer: Medicare Other | Attending: Internal Medicine

## 2019-06-16 DIAGNOSIS — Z23 Encounter for immunization: Secondary | ICD-10-CM | POA: Insufficient documentation

## 2019-06-16 NOTE — Progress Notes (Signed)
   Covid-19 Vaccination Clinic  Name:  Gerald Williams    MRN: [id number] DOB: 1950-06-25  06/16/2019  [name] was observed post Covid-19 immunization for 15 minutes without incidence. He was provided with Vaccine Information Sheet and instruction to access the V-Safe system.   [name] was instructed to call 911 with any severe reactions post vaccine: Marland Kitchen Difficulty breathing  . Swelling of your face and throat  . A fast heartbeat  . A bad rash all over your body  . Dizziness and weakness    Immunizations Administered    Name Date Dose VIS Date Route   Moderna COVID-19 Vaccine 06/16/2019 12:00 PM 0.5 mL 03/26/2019 Intramuscular   Manufacturer: Moderna   Lot: [id number]   NDC: 14782-956-21

## 2019-06-23 DIAGNOSIS — J449 Chronic obstructive pulmonary disease, unspecified: Secondary | ICD-10-CM | POA: Diagnosis not present

## 2019-07-15 ENCOUNTER — Telehealth: Payer: Self-pay | Admitting: Internal Medicine

## 2019-07-15 NOTE — Telephone Encounter (Signed)
Contacted patient from recall list to schedule yearly follow up appt with Dr. Rennis Golden.  Patient stated he found another cardiologist.

## 2019-07-21 DIAGNOSIS — J449 Chronic obstructive pulmonary disease, unspecified: Secondary | ICD-10-CM | POA: Diagnosis not present

## 2019-07-28 DIAGNOSIS — R0602 Shortness of breath: Secondary | ICD-10-CM | POA: Diagnosis not present

## 2019-07-28 DIAGNOSIS — R52 Pain, unspecified: Secondary | ICD-10-CM | POA: Diagnosis not present

## 2019-08-21 DIAGNOSIS — J449 Chronic obstructive pulmonary disease, unspecified: Secondary | ICD-10-CM | POA: Diagnosis not present

## 2019-09-20 DIAGNOSIS — J449 Chronic obstructive pulmonary disease, unspecified: Secondary | ICD-10-CM | POA: Diagnosis not present

## 2019-10-21 DIAGNOSIS — J449 Chronic obstructive pulmonary disease, unspecified: Secondary | ICD-10-CM | POA: Diagnosis not present

## 2019-11-20 DIAGNOSIS — J449 Chronic obstructive pulmonary disease, unspecified: Secondary | ICD-10-CM | POA: Diagnosis not present

## 2019-12-21 DIAGNOSIS — J449 Chronic obstructive pulmonary disease, unspecified: Secondary | ICD-10-CM | POA: Diagnosis not present

## 2020-01-20 ENCOUNTER — Ambulatory Visit: Payer: No Typology Code available for payment source | Admitting: Pulmonary Disease

## 2020-01-21 ENCOUNTER — Telehealth: Payer: Self-pay | Admitting: Pulmonary Disease

## 2020-01-21 ENCOUNTER — Telehealth: Payer: Self-pay | Admitting: Internal Medicine

## 2020-01-21 DIAGNOSIS — J449 Chronic obstructive pulmonary disease, unspecified: Secondary | ICD-10-CM | POA: Diagnosis not present

## 2020-01-21 NOTE — Telephone Encounter (Signed)
Called pt no answer LMOM w/MD response../lm,b 

## 2020-01-21 NOTE — Telephone Encounter (Signed)
We do not have any symbicort samples in office. lmtcb X1 for pt to make aware.

## 2020-01-21 NOTE — Telephone Encounter (Signed)
Needs an appt.  Should go to urgent care if he is not urinating at all or has other concerning urine symptoms.

## 2020-01-21 NOTE — Telephone Encounter (Signed)
[facility] Report/Call ; ---He is unable to urinate and has swelling in his feet when he first gets up in the morning. no temp, cold and chills  Advised go to ED now. (I do not see where patient went to ED)

## 2020-01-22 ENCOUNTER — Ambulatory Visit: Payer: No Typology Code available for payment source | Admitting: Pulmonary Disease

## 2020-01-22 NOTE — Telephone Encounter (Signed)
Attempted to call pt but unable to reach. Left message for him to return call. Due to multiple attempts trying to reach pt and unable to do so, per triage protocol encounter will be closed. 

## 2020-01-23 ENCOUNTER — Ambulatory Visit: Payer: No Typology Code available for payment source | Admitting: Pulmonary Disease

## 2020-02-20 DIAGNOSIS — J449 Chronic obstructive pulmonary disease, unspecified: Secondary | ICD-10-CM | POA: Diagnosis not present

## 2020-03-22 DIAGNOSIS — J449 Chronic obstructive pulmonary disease, unspecified: Secondary | ICD-10-CM | POA: Diagnosis not present

## 2020-04-03 DIAGNOSIS — R350 Frequency of micturition: Secondary | ICD-10-CM | POA: Diagnosis not present

## 2020-04-03 DIAGNOSIS — R300 Dysuria: Secondary | ICD-10-CM | POA: Diagnosis not present

## 2020-04-03 DIAGNOSIS — R3911 Hesitancy of micturition: Secondary | ICD-10-CM | POA: Diagnosis not present

## 2020-04-03 DIAGNOSIS — R3912 Poor urinary stream: Secondary | ICD-10-CM | POA: Diagnosis not present

## 2020-04-21 DIAGNOSIS — J449 Chronic obstructive pulmonary disease, unspecified: Secondary | ICD-10-CM | POA: Diagnosis not present

## 2020-05-08 DIAGNOSIS — R350 Frequency of micturition: Secondary | ICD-10-CM | POA: Diagnosis not present

## 2020-05-08 DIAGNOSIS — R3912 Poor urinary stream: Secondary | ICD-10-CM | POA: Diagnosis not present

## 2020-05-13 DIAGNOSIS — R350 Frequency of micturition: Secondary | ICD-10-CM | POA: Diagnosis not present

## 2020-05-19 ENCOUNTER — Emergency Department (HOSPITAL_COMMUNITY)
Admission: EM | Admit: 2020-05-19 | Discharge: 2020-05-19 | Disposition: A | Discharge status: Home / Self Care | Payer: No Typology Code available for payment source | Attending: Emergency Medicine | Admitting: Emergency Medicine

## 2020-05-19 ENCOUNTER — Encounter (HOSPITAL_COMMUNITY): Payer: Self-pay

## 2020-05-19 DIAGNOSIS — Z87891 Personal history of nicotine dependence: Secondary | ICD-10-CM | POA: Diagnosis not present

## 2020-05-19 DIAGNOSIS — Z79899 Other long term (current) drug therapy: Secondary | ICD-10-CM | POA: Diagnosis not present

## 2020-05-19 DIAGNOSIS — J449 Chronic obstructive pulmonary disease, unspecified: Secondary | ICD-10-CM | POA: Insufficient documentation

## 2020-05-19 DIAGNOSIS — R339 Retention of urine, unspecified: Secondary | ICD-10-CM | POA: Diagnosis not present

## 2020-05-19 DIAGNOSIS — R39198 Other difficulties with micturition: Secondary | ICD-10-CM | POA: Diagnosis present

## 2020-05-19 DIAGNOSIS — R1030 Lower abdominal pain, unspecified: Secondary | ICD-10-CM | POA: Diagnosis not present

## 2020-05-19 DIAGNOSIS — Z955 Presence of coronary angioplasty implant and graft: Secondary | ICD-10-CM | POA: Insufficient documentation

## 2020-05-19 DIAGNOSIS — Z7951 Long term (current) use of inhaled steroids: Secondary | ICD-10-CM | POA: Insufficient documentation

## 2020-05-19 DIAGNOSIS — I10 Essential (primary) hypertension: Secondary | ICD-10-CM | POA: Insufficient documentation

## 2020-05-19 DIAGNOSIS — R338 Other retention of urine: Secondary | ICD-10-CM

## 2020-05-19 LAB — URINALYSIS, ROUTINE W REFLEX MICROSCOPIC
Bilirubin Urine: NEGATIVE
Glucose, UA: NEGATIVE mg/dL
Hgb urine dipstick: NEGATIVE
Ketones, ur: NEGATIVE mg/dL
Leukocytes,Ua: NEGATIVE
Nitrite: NEGATIVE
Protein, ur: NEGATIVE mg/dL
Specific Gravity, Urine: 1.03 (ref 1.005–1.030)
pH: 6 (ref 5.0–8.0)

## 2020-05-19 LAB — BASIC METABOLIC PANEL
Anion gap: 8 (ref 5–15)
BUN: 26 mg/dL — ABNORMAL HIGH (ref 8–23)
CO2: 29 mmol/L (ref 22–32)
Calcium: 9.5 mg/dL (ref 8.9–10.3)
Chloride: 106 mmol/L (ref 98–111)
Creatinine, Ser: 1.54 mg/dL — ABNORMAL HIGH (ref 0.61–1.24)
GFR, Estimated: 49 mL/min — ABNORMAL LOW (ref >60)
Glucose, Bld: 101 mg/dL — ABNORMAL HIGH (ref 70–99)
Potassium: 4.9 mmol/L (ref 3.5–5.1)
Sodium: 143 mmol/L (ref 135–145)

## 2020-05-19 LAB — CBC
HCT: 40.3 % (ref 39.0–52.0)
Hemoglobin: 13 g/dL (ref 13.0–17.0)
MCH: 30.4 pg (ref 26.0–34.0)
MCHC: 32.3 g/dL (ref 30.0–36.0)
MCV: 94.4 fL (ref 80.0–100.0)
Platelets: 218 10*3/uL (ref 150–400)
RBC: 4.27 MIL/uL (ref 4.22–5.81)
RDW: 13.2 % (ref 11.5–15.5)
WBC: 5.4 10*3/uL (ref 4.0–10.5)
nRBC: 0 % (ref 0.0–0.2)

## 2020-05-19 NOTE — ED Triage Notes (Signed)
Pt arrived via walk in, states he "hasnt been able to pee in a month" states he has only been "dribbling" when he tries to urinate. Has been to [place] and urology x1 week ago with no answer.

## 2020-05-19 NOTE — ED Provider Notes (Signed)
[facility]-EMERGENCY DEPT Provider Note   CSN: [id number] Arrival date & time: 05/19/20  3785     History Chief Complaint  Patient presents with  . Urinary Retention    Gerald Williams is a 70 y.o. male.  HPI   Patient presented to the ED for evaluation of urinary difficulty.  Patient states his symptoms started at least a month ago.  Does not feel like he has been able to urinate properly since that time.  Patient states he will just dribble a small amount of urine out when he is trying to urinate but then will have to go right away again.  He did go to the [place] and also would was referred to urology clinic.  He was started on tamsulosin and Ditropan.  Patient does not feel it is helping.  In the last several days it has gotten much worse.  He feels like he has to empty his bladder he cannot take it much longer.  Past Medical History:  Diagnosis Date  . Emphysema   . Erectile dysfunction   . Hyperlipemia   . Hypertension   . Polysubstance abuse (HCC)    stopped using crack/cocaine December 4th 20-14  . PTSD (post-traumatic stress disorder)   . Sleep apnea     Patient Active Problem List   Diagnosis Date Noted  . Chronic respiratory failure with hypoxia (HCC) 09/18/2018  . Exercise hypoxemia 11/07/2017  . Frequent headaches 05/23/2017  . Acute pain of right knee 12/23/2016  . 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 Blood in stool 10/05/2015  . HTN (hypertension) 06/18/2015  . Cephalalgia 06/18/2015  . Dyslipidemia 06/12/2015  . COPD GOLD III 05/28/2015  . Peripheral neuropathy 05/20/2015  . COPD (chronic obstructive pulmonary disease) with emphysema (HCC) 05/20/2015  . PTSD (post-traumatic stress disorder) 05/20/2015  . Right carotid bruit 05/20/2015  . Frequent urination 05/20/2015  . Hematuria 07/15/2014  . History of tobacco abuse:  quit in ~ 06/2013  07/14/2014  . OSA on CPAP 07/14/2014  . Pre-diabetes 07/14/2014  .  Claudication, R>L 07/14/2014  . Cocaine use, history of 03/26/2011  . Chest pain 03/26/2011    Past Surgical History:  Procedure Laterality Date  . CARDIAC CATHETERIZATION    . LEFT HEART CATHETERIZATION WITH CORONARY ANGIOGRAM N/A 07/15/2014   Procedure: LEFT HEART CATHETERIZATION WITH CORONARY ANGIOGRAM;  Surgeon: Lennette Bihari, MD;  Location: [facility] CATH LAB;  Service: Cardiovascular;  Laterality: N/A;       Family History  Problem Relation Age of Onset  . Diabetes Mother        Deceased  . Heart disease Father        Deceased  . Heart attack Father        x5  . Cancer - Other Sister   . Healthy Son   . Healthy Daughter   . Heart attack Brother        Deceased  . Alcohol abuse Brother        Deceased    Social History   Tobacco Use  . Smoking status: Former Smoker    Packs/day: 1.50    Years: 50.00    Pack years: 75.00    Types: Cigarettes    Start date: 04/26/1963    Quit date: 09/22/2013    Years since quitting: 6.6  . Smokeless tobacco: Never Used  . Tobacco comment: patient using the patch, losenges, and medication  Vaping Use  . Vaping Use: Never  used  Substance Use Topics  . Alcohol use: No    Alcohol/week: 0.0 standard drinks    Comment: He was drinking 3-4 drinks nightly x 40 years, quit in 2014.    . Drug use: No    Comment: history of crack use and drug tested weekly at the [facility] Medications Prior to Admission medications   Medication Sig Start Date End Date Taking? Authorizing Provider  acetaminophen (TYLENOL) 325 MG tablet Take 650 mg by mouth every 6 (six) hours as needed for moderate pain.    Yes [provider]  albuterol (PROVENTIL HFA;VENTOLIN HFA) 108 (90 BASE) MCG/ACT inhaler Inhale 2 puffs into the lungs every 6 (six) hours as needed for shortness of breath or wheezing.   Yes [provider]  albuterol (PROVENTIL) (2.5 MG/3ML) 0.083% nebulizer solution Take 2.5 mg by nebulization 2 (two) times daily.   Yes [provider]  bisacodyl (DULCOLAX) 5 MG EC tablet Take 5 mg by mouth 2 (two) times daily.   Yes [provider]  lidocaine (XYLOCAINE) 5 % ointment Apply 1 application topically as needed. To left lateral chest wall Patient taking differently: Apply 1 application topically daily as needed for mild pain. Apply to legs 05/07/19  Yes Alessandra Bevels, MD  oxybutynin (DITROPAN-XL) 10 MG 24 hr tablet Take 10 mg by mouth daily.   Yes [provider]  OXYGEN Inhale 4 L into the lungs continuous. 3lpm 24/7  [employer]   Yes [provider]  Polyethyl Glycol-Propyl Glycol 0.4-0.3 % SOLN Apply 2 drops to eye as needed. Patient taking differently: Place 2 drops into both eyes daily as needed (dry eyes). 08/25/16  Yes Nche, Bonna Gains, NP  tamsulosin (FLOMAX) 0.4 MG CAPS capsule Take 0.4 mg by mouth daily. 04/03/20  Yes [provider]  atorvastatin (LIPITOR) 80 MG tablet Take 80 mg by mouth daily.    [provider]  glucose blood (COOL BLOOD GLUCOSE TEST STRIPS) test strip Used to test blood sugar x2 daily---diagnosis code r73.03--for one touch verio flex 12/08/15   [name], MD  Lancets MISC Used to test blood sugar x2 daily---diagnosis code r73.03--for one touch verio flex 12/08/15   [name], MD  metoprolol succinate (TOPROL-XL) 50 MG 24 hr tablet Take 50 mg by mouth daily.    [provider]  NON FORMULARY at bedtime. CPAP - [facility]    [provider]  nortriptyline (PAMELOR) 10 MG capsule Take 1 capsule (10 mg total) by mouth at bedtime. Patient not taking: Reported on 05/19/2020 09/23/15   Nita Sickle K, DO  valsartan (DIOVAN) 80 MG tablet Take 1 tablet (80 mg total) by mouth daily. Patient not taking: Reported on 05/19/2020 05/26/15   [name], MD    Allergies    Patient has no known allergies.  Review of Systems   Review of Systems  All other systems reviewed and are negative.   Physical Exam Updated Vital  Signs BP 127/86   Pulse 81   Temp 98 F (36.7 C) (Oral)   Resp (!) 21   Ht 1.664 m (5' 5.5")   Wt 79.4 kg   SpO2 100%   BMI 28.68 kg/m   Physical Exam Vitals and nursing note reviewed.  Constitutional:      General: He is not in acute distress.    Appearance: He is well-developed and well-nourished.  HENT:     Head: Normocephalic and atraumatic.  Right Ear: External ear normal.     Left Ear: External ear normal.  Eyes:     General: No scleral icterus.       Right eye: No discharge.        Left eye: No discharge.     Conjunctiva/sclera: Conjunctivae normal.  Neck:     Trachea: No tracheal deviation.  Cardiovascular:     Rate and Rhythm: Normal rate and regular rhythm.     Pulses: Intact distal pulses.  Pulmonary:     Effort: Pulmonary effort is normal. No respiratory distress.     Breath sounds: Normal breath sounds. No stridor. No wheezing or rales.  Abdominal:     General: Bowel sounds are normal. There is no distension.     Palpations: Abdomen is soft.     Tenderness: There is abdominal tenderness. There is no guarding or rebound.     Comments: Suprapubic region  Musculoskeletal:        General: No tenderness or edema.     Cervical back: Neck supple.  Skin:    General: Skin is warm and dry.     Findings: No rash.  Neurological:     Mental Status: He is alert.     Cranial Nerves: No cranial nerve deficit (no facial droop, extraocular movements intact, no slurred speech).     Sensory: No sensory deficit.     Motor: No abnormal muscle tone or seizure activity.     Coordination: Coordination normal.     Deep Tendon Reflexes: Strength normal.  Psychiatric:        Mood and Affect: Mood and affect normal.     ED Results / Procedures / Treatments   Labs (all labs ordered are listed, but only abnormal results are displayed) Labs Reviewed  URINALYSIS, ROUTINE W REFLEX MICROSCOPIC - Abnormal; Notable for the following components:      Result Value   Bacteria,  UA RARE (*)    All other components within normal limits  BASIC METABOLIC PANEL - Abnormal; Notable for the following components:   Glucose, Bld 101 (*)    BUN 26 (*)    Creatinine, Ser 1.54 (*)    GFR, Estimated 49 (*)    All other components within normal limits  CBC     Procedures Procedures   Medications Ordered in ED Medications - No data to display  ED Course  I have reviewed the triage vital signs and the nursing notes.  Pertinent labs & imaging results that were available during my care of the patient were reviewed by me and considered in my medical decision making (see chart for details).  Clinical Course as of 05/19/20 1757  Tue May 19, 2020  1552 Urinalysis without signs of infection [JK]  1748 Creatinine slightly increased compared to previous.  CBC normal. [JK]  1755 Foley catheter was placed.  Patient did end of draining 600 cc of urine [JK]    Clinical Course User Index [JK] Linwood Dibbles, MD   MDM Rules/Calculators/A&P                          Patient presented to the ED for evaluation of difficulty with urination.  Presentation was concerning for the possibility of renal failure versus urinary retention.  UTI was a consideration although urinalysis was normal.  Patient's laboratory test did show mild renal insufficiency but not significantly changed from baseline.  Patient had a Foley catheter placed and had good  urine output.  Patient had symptomatic relief.  Patient improved with treatment.  Plan to discharge home with outpatient Foley catheter bag.  Outpatient follow-up with urology  Final Clinical Impression(s) / ED Diagnoses Final diagnoses:  Acute urinary retention      Linwood Dibbles, MD 05/19/20 1758

## 2020-05-19 NOTE — Discharge Instructions (Addendum)
Use the leg bag catheter.  Contact the urologist office to make an appointment  to remove the catheter.  Continue your current medications

## 2020-05-22 DIAGNOSIS — J449 Chronic obstructive pulmonary disease, unspecified: Secondary | ICD-10-CM | POA: Diagnosis not present

## 2020-05-22 DIAGNOSIS — R3912 Poor urinary stream: Secondary | ICD-10-CM | POA: Diagnosis not present

## 2020-05-22 DIAGNOSIS — R3914 Feeling of incomplete bladder emptying: Secondary | ICD-10-CM | POA: Diagnosis not present

## 2020-05-25 DIAGNOSIS — R3914 Feeling of incomplete bladder emptying: Secondary | ICD-10-CM | POA: Diagnosis not present

## 2020-06-04 DIAGNOSIS — R3914 Feeling of incomplete bladder emptying: Secondary | ICD-10-CM | POA: Diagnosis not present

## 2020-06-22 DIAGNOSIS — J449 Chronic obstructive pulmonary disease, unspecified: Secondary | ICD-10-CM | POA: Diagnosis not present

## 2020-07-13 DIAGNOSIS — R3914 Feeling of incomplete bladder emptying: Secondary | ICD-10-CM | POA: Diagnosis not present

## 2020-07-14 ENCOUNTER — Other Ambulatory Visit: Payer: Self-pay | Admitting: Urology

## 2020-07-20 DIAGNOSIS — J449 Chronic obstructive pulmonary disease, unspecified: Secondary | ICD-10-CM | POA: Diagnosis not present

## 2020-07-24 NOTE — Patient Instructions (Addendum)
DUE TO COVID-19 ONLY ONE VISITOR IS ALLOWED TO COME WITH YOU AND STAY IN THE WAITING ROOM ONLY DURING PRE OP AND PROCEDURE DAY OF SURGERY. THE 1 VISITOR  MAY VISIT WITH YOU AFTER SURGERY IN YOUR PRIVATE ROOM DURING VISITING HOURS ONLY!  YOU NEED TO HAVE A COVID 19 TEST ON: 07/30/20 @ 8:15 AM , THIS TEST MUST BE DONE BEFORE SURGERY,  COVID TESTING SITE [address] [place] [address], IT IS ON THE RIGHT GOING OUT [address] APPROXIMATELY  2 MINUTES PAST [employer] ON THE RIGHT. ONCE YOUR COVID TEST IS COMPLETED,  PLEASE BEGIN THE QUARANTINE INSTRUCTIONS AS OUTLINED IN YOUR HANDOUT.                Petr Bontempo   Your procedure is scheduled on: 08/03/20   Report to [facility] Main  Entrance   Report to admitting at: 6:00 AM     Call this number if you have problems the morning of surgery [phone]    Remember: Do not eat food or drink liquids :After Midnight.   BRUSH YOUR TEETH MORNING OF SURGERY AND RINSE YOUR MOUTH OUT, NO CHEWING GUM CANDY OR MINTS.    Take these medicines the morning of surgery with A SIP OF WATER: duloxetine.Use inhalers as usual.Oxybutynin as needed.                               You may not have any metal on your body including hair pins and              piercings  Do not wear jewelry, lotions, powders or perfumes, deodorant             Men may shave face and neck.   Do not bring valuables to the hospital. [facility] IS NOT             RESPONSIBLE   FOR VALUABLES.  Contacts, dentures or bridgework may not be worn into surgery.  Leave suitcase in the car. After surgery it may be brought to your room.     Patients discharged the day of surgery will not be allowed to drive home. IF YOU ARE HAVING SURGERY AND GOING HOME THE SAME DAY, YOU MUST HAVE AN ADULT TO DRIVE YOU HOME AND BE WITH YOU FOR 24 HOURS. YOU MAY GO HOME BY TAXI OR UBER OR ORTHERWISE, BUT AN ADULT MUST ACCOMPANY YOU HOME AND STAY WITH YOU FOR 24 HOURS.  Name and  phone number of your driver:  Special Instructions: N/A              Please read over the following fact sheets you were given: _____________________________________________________________________         [facility] - Preparing for Surgery Before surgery, you can play an important role.  Because skin is not sterile, your skin needs to be as free of germs as possible.  You can reduce the number of germs on your skin by washing with CHG (chlorahexidine gluconate) soap before surgery.  CHG is an antiseptic cleaner which kills germs and bonds with the skin to continue killing germs even after washing. Please DO NOT use if you have an allergy to CHG or antibacterial soaps.  If your skin becomes reddened/irritated stop using the CHG and inform your nurse when you arrive at Short Stay. Do not shave (including legs and underarms) for at least 48 hours prior  to the first CHG shower.  You may shave your face/neck. Please follow these instructions carefully:  1.  Shower with CHG Soap the night before surgery and the  morning of Surgery.  2.  If you choose to wash your hair, wash your hair first as usual with your  normal  shampoo.  3.  After you shampoo, rinse your hair and body thoroughly to remove the  shampoo.                           4.  Use CHG as you would any other liquid soap.  You can apply chg directly  to the skin and wash                       Gently with a scrungie or clean washcloth.  5.  Apply the CHG Soap to your body ONLY FROM THE NECK DOWN.   Do not use on face/ open                           Wound or open sores. Avoid contact with eyes, ears mouth and genitals (private parts).                       Wash face,  Genitals (private parts) with your normal soap.             6.  Wash thoroughly, paying special attention to the area where your surgery  will be performed.  7.  Thoroughly rinse your body with warm water from the neck down.  8.  DO NOT shower/wash with your normal soap after  using and rinsing off  the CHG Soap.                9.  Pat yourself dry with a clean towel.            10.  Wear clean pajamas.            11.  Place clean sheets on your bed the night of your first shower and do not  sleep with pets. Day of Surgery : Do not apply any lotions/deodorants the morning of surgery.  Please wear clean clothes to the hospital/surgery center.  FAILURE TO FOLLOW THESE INSTRUCTIONS MAY RESULT IN THE CANCELLATION OF YOUR SURGERY PATIENT SIGNATURE_________________________________  NURSE SIGNATURE__________________________________  ________________________________________________________________________

## 2020-07-27 ENCOUNTER — Encounter (HOSPITAL_COMMUNITY): Payer: Self-pay

## 2020-07-27 ENCOUNTER — Other Ambulatory Visit: Payer: Self-pay

## 2020-07-27 ENCOUNTER — Encounter (HOSPITAL_COMMUNITY)
Admission: RE | Admit: 2020-07-27 | Discharge: 2020-07-27 | Disposition: A | Discharge status: Home / Self Care | Payer: Medicare Other | Source: Ambulatory Visit | Attending: Urology | Admitting: Urology

## 2020-07-27 DIAGNOSIS — Z01818 Encounter for other preprocedural examination: Secondary | ICD-10-CM | POA: Diagnosis not present

## 2020-07-27 LAB — CBC
HCT: 36.6 % — ABNORMAL LOW (ref 39.0–52.0)
Hemoglobin: 11.9 g/dL — ABNORMAL LOW (ref 13.0–17.0)
MCH: 30.3 pg (ref 26.0–34.0)
MCHC: 32.5 g/dL (ref 30.0–36.0)
MCV: 93.1 fL (ref 80.0–100.0)
Platelets: 210 10*3/uL (ref 150–400)
RBC: 3.93 MIL/uL — ABNORMAL LOW (ref 4.22–5.81)
RDW: 13.4 % (ref 11.5–15.5)
WBC: 4.5 10*3/uL (ref 4.0–10.5)
nRBC: 0 % (ref 0.0–0.2)

## 2020-07-27 LAB — BASIC METABOLIC PANEL
Anion gap: 7 (ref 5–15)
BUN: 18 mg/dL (ref 8–23)
CO2: 28 mmol/L (ref 22–32)
Calcium: 8.9 mg/dL (ref 8.9–10.3)
Chloride: 105 mmol/L (ref 98–111)
Creatinine, Ser: 1.12 mg/dL (ref 0.61–1.24)
GFR, Estimated: >60 mL/min (ref >60)
Glucose, Bld: 117 mg/dL — ABNORMAL HIGH (ref 70–99)
Potassium: 4 mmol/L (ref 3.5–5.1)
Sodium: 140 mmol/L (ref 135–145)

## 2020-07-27 NOTE — Progress Notes (Signed)
COVID Vaccine Completed: Yes Date COVID Vaccine completed: 03/28/20 COVID vaccine manufacturer:    Gala Murdoch     PCP - [facility] Cardiologist - Dr. Zoila Shutter  Chest x-ray - 05/06/19 EKG -  Stress Test -  ECHO - 05/07/19 Cardiac Cath -  Pacemaker/ICD device last checked:  Sleep Study - Yes CPAP - NO  Fasting Blood Sugar -  Checks Blood Sugar _____ times a day  Blood Thinner Instructions: Aspirin Instructions: Last Dose:  Anesthesia review: Hx: Emphysema,O2 dependent,OSA(No CPAP),COPD.  Patient denies shortness of breath, fever, cough and chest pain at PAT appointment   Patient verbalized understanding of instructions that were given to them at the PAT appointment. Patient was also instructed that they will need to review over the PAT instructions again at home before surgery.

## 2020-07-30 ENCOUNTER — Other Ambulatory Visit (HOSPITAL_COMMUNITY)
Admission: RE | Admit: 2020-07-30 | Discharge: 2020-07-30 | Disposition: A | Discharge status: Home / Self Care | Payer: Medicare Other | Source: Ambulatory Visit | Attending: Urology | Admitting: Urology

## 2020-07-30 DIAGNOSIS — Z20822 Contact with and (suspected) exposure to covid-19: Secondary | ICD-10-CM | POA: Diagnosis not present

## 2020-07-30 DIAGNOSIS — Z01812 Encounter for preprocedural laboratory examination: Secondary | ICD-10-CM | POA: Diagnosis not present

## 2020-07-30 LAB — SARS CORONAVIRUS 2 (TAT 6-24 HRS): SARS Coronavirus 2: NEGATIVE

## 2020-08-02 NOTE — Anesthesia Preprocedure Evaluation (Addendum)
Anesthesia Evaluation  Patient identified by MRN, date of birth, ID band Patient awake    Reviewed: Allergy & Precautions, NPO status , Patient's Chart, lab work & pertinent test results  Airway Mallampati: II  TM Distance: >3 FB Neck ROM: Full    Dental  (+) Partial Lower   Pulmonary sleep apnea , COPD,  COPD inhaler, former smoker,    Pulmonary exam normal        Cardiovascular hypertension, Pt. on medications  Rhythm:Regular Rate:Normal     Neuro/Psych  Headaches, Anxiety    GI/Hepatic negative GI ROS, Neg liver ROS,   Endo/Other  negative endocrine ROS  Renal/GU negative Renal ROS   BPH    Musculoskeletal negative musculoskeletal ROS (+)   Abdominal (+)  Abdomen: soft. Bowel sounds: normal.  Peds  Hematology negative hematology ROS (+)   Anesthesia Other Findings   Reproductive/Obstetrics                            Anesthesia Physical Anesthesia Plan  ASA: II  Anesthesia Plan: General   Post-op Pain Management:    Induction: Intravenous  PONV Risk Score and Plan: 2 and Ondansetron, Dexamethasone and Treatment may vary due to age or medical condition  Airway Management Planned: Mask and LMA  Additional Equipment: None  Intra-op Plan:   Post-operative Plan: Extubation in OR  Informed Consent: I have reviewed the patients History and Physical, chart, labs and discussed the procedure including the risks, benefits and alternatives for the proposed anesthesia with the patient or authorized representative who has indicated his/her understanding and acceptance.     Dental advisory given  Plan Discussed with: CRNA  Anesthesia Plan Comments: (Lab Results      Component                Value               Date                      WBC                      4.5                 07/27/2020                HGB                      11.9 (L)            07/27/2020                 HCT                      36.6 (L)            07/27/2020                MCV                      93.1                07/27/2020                PLT                      210  07/27/2020           Lab Results      Component                Value               Date                      NA                       140                 07/27/2020                K                        4.0                 07/27/2020                CO2                      28                  07/27/2020                GLUCOSE                  117 (H)             07/27/2020                BUN                      18                  07/27/2020                CREATININE               1.12                07/27/2020                CALCIUM                  8.9                 07/27/2020                GFRNONAA                 >60                 07/27/2020                GFRAA                    >60                 05/06/2019          )       Anesthesia Quick Evaluation

## 2020-08-02 NOTE — H&P (Signed)
H&P  Chief Complaint: Urinary retention  History of Present Illness: 70 yo male w/ urinary retentionsecomdary to BPH. He has an indwelling catheter. UDS revealed obstructive uropathy. He presents for Urolift procedure for mgmt.  Past Medical History:  Diagnosis Date  . Emphysema   . Erectile dysfunction   . Hyperlipemia   . Hypertension   . Polysubstance abuse (HCC)    stopped using crack/cocaine December 4th 20-14  . PTSD (post-traumatic stress disorder)   . Sleep apnea     Past Surgical History:  Procedure Laterality Date  . CARDIAC CATHETERIZATION    . LEFT HEART CATHETERIZATION WITH CORONARY ANGIOGRAM N/A 07/15/2014   Procedure: LEFT HEART CATHETERIZATION WITH CORONARY ANGIOGRAM;  Surgeon: Lennette Bihari, MD;  Location: [facility] CATH LAB;  Service: Cardiovascular;  Laterality: N/A;    Home Medications:  Allergies as of 08/02/2020   No Known Allergies     Medication List    Notice   Cannot display discharge medications because the patient has not yet been admitted.     Allergies: No Known Allergies  Family History  Problem Relation Age of Onset  . Diabetes Mother        Deceased  . Heart disease Father        Deceased  . Heart attack Father        x5  . Cancer - Other Sister   . Healthy Son   . Healthy Daughter   . Heart attack Brother        Deceased  . Alcohol abuse Brother        Deceased    Social History:  reports that he quit smoking about 6 years ago. His smoking use included cigarettes. He started smoking about 57 years ago. He has a 75.00 pack-year smoking history. He has never used smokeless tobacco. He reports that he does not drink alcohol and does not use drugs.  ROS: A complete review of systems was performed.  All systems are negative except for pertinent findings as noted.  Physical Exam:  Vital signs in last 24 hours: There were no vitals taken for this visit. Constitutional:  Alert and oriented, No acute distress Cardiovascular: Regular  rate  Respiratory: Normal respiratory effort GI: Abdomen is soft, nontender, nondistended, no abdominal masses. No CVAT.  Genitourinary: Normal male phallus, testes are descended bilaterally and non-tender and without masses, scrotum is normal in appearance without lesions or masses, perineum is normal on inspection. Lymphatic: No lymphadenopathy Neurologic: Grossly intact, no focal deficits Psychiatric: Normal mood and affect  Laboratory Data:  No results for input(s): WBC, HGB, HCT, PLT in the last 72 hours.  No results for input(s): NA, K, CL, GLUCOSE, BUN, CALCIUM, CREATININE in the last 72 hours.  Invalid input(s): CO3   No results found for this or any previous visit (from the past 24 hour(s)). Recent Results (from the past 240 hour(s))  SARS CORONAVIRUS 2 (TAT 6-24 HRS) Nasopharyngeal Nasopharyngeal Swab     Status: None   Collection Time: 07/30/20  8:07 AM   Specimen: Nasopharyngeal Swab  Result Value Ref Range Status   SARS Coronavirus 2 NEGATIVE NEGATIVE Final    Comment: (NOTE) SARS-CoV-2 target nucleic acids are NOT DETECTED.  The SARS-CoV-2 RNA is generally detectable in upper and lower respiratory specimens during the acute phase of infection. Negative results do not preclude SARS-CoV-2 infection, do not rule out co-infections with other pathogens, and should not be used as the sole basis for treatment or other patient management  decisions. Negative results must be combined with clinical observations, patient history, and epidemiological information. The expected result is Negative.  Fact Sheet for Patients: HairSlick.no  Fact Sheet for Healthcare Providers: quierodirigir.com  This test is not yet approved or cleared by the [place] FDA and  has been authorized for detection and/or diagnosis of SARS-CoV-2 by FDA under an Emergency Use Authorization (EUA). This EUA will remain  in effect (meaning this  test can be used) for the duration of the COVID-19 declaration under Se ction 564(b)(1) of the Act, 21 U.S.C. section 360bbb-3(b)(1), unless the authorization is terminated or revoked sooner.  Performed at [facility], [address], [place], [place] [address]     Renal Function: Recent Labs    07/27/20 0825  CREATININE 1.12   Estimated Creatinine Clearance: 61.6 mL/min (by C-G formula based on SCr of 1.12 mg/dL).  Radiologic Imaging: No results found.  Impression/Assessment:  BPH w/ urinary retention  Plan:  Urolift

## 2020-08-03 ENCOUNTER — Ambulatory Visit (HOSPITAL_COMMUNITY): Payer: Medicare Other | Admitting: Anesthesiology

## 2020-08-03 ENCOUNTER — Other Ambulatory Visit: Payer: Self-pay

## 2020-08-03 ENCOUNTER — Ambulatory Visit (HOSPITAL_COMMUNITY)
Admission: RE | Admit: 2020-08-03 | Discharge: 2020-08-03 | Disposition: A | Discharge status: Home / Self Care | Payer: Medicare Other | Source: Ambulatory Visit | Attending: Urology | Admitting: Urology

## 2020-08-03 ENCOUNTER — Encounter (HOSPITAL_COMMUNITY): Payer: Self-pay | Admitting: Urology

## 2020-08-03 ENCOUNTER — Encounter (HOSPITAL_COMMUNITY)
Admission: RE | Disposition: A | Discharge status: Home / Self Care | Payer: Self-pay | Source: Ambulatory Visit | Attending: Urology

## 2020-08-03 DIAGNOSIS — N138 Other obstructive and reflux uropathy: Secondary | ICD-10-CM | POA: Insufficient documentation

## 2020-08-03 DIAGNOSIS — R350 Frequency of micturition: Secondary | ICD-10-CM | POA: Diagnosis not present

## 2020-08-03 DIAGNOSIS — N401 Enlarged prostate with lower urinary tract symptoms: Secondary | ICD-10-CM | POA: Diagnosis not present

## 2020-08-03 DIAGNOSIS — Z87891 Personal history of nicotine dependence: Secondary | ICD-10-CM | POA: Insufficient documentation

## 2020-08-03 DIAGNOSIS — Z9989 Dependence on other enabling machines and devices: Secondary | ICD-10-CM | POA: Diagnosis not present

## 2020-08-03 DIAGNOSIS — G4733 Obstructive sleep apnea (adult) (pediatric): Secondary | ICD-10-CM | POA: Diagnosis not present

## 2020-08-03 HISTORY — PX: CYSTOSCOPY WITH INSERTION OF UROLIFT: SHX6678

## 2020-08-03 SURGERY — CYSTOSCOPY WITH INSERTION OF UROLIFT
Anesthesia: General

## 2020-08-03 MED ORDER — PROPOFOL 10 MG/ML IV BOLUS
INTRAVENOUS | Status: DC | PRN
  Administered 2020-08-03: 130 mg via INTRAVENOUS

## 2020-08-03 MED ORDER — LIDOCAINE 2% (20 MG/ML) 5 ML SYRINGE
INTRAMUSCULAR | Status: AC
  Filled 2020-08-03: qty 10

## 2020-08-03 MED ORDER — OXYCODONE HCL 5 MG PO TABS
ORAL_TABLET | ORAL | Status: AC
  Filled 2020-08-03: qty 1

## 2020-08-03 MED ORDER — FENTANYL CITRATE (PF) 100 MCG/2ML IJ SOLN
INTRAMUSCULAR | Status: AC
  Administered 2020-08-03: 50 ug via INTRAVENOUS
  Filled 2020-08-03: qty 2

## 2020-08-03 MED ORDER — ONDANSETRON HCL 4 MG/2ML IJ SOLN: 4.0000 mg | Freq: Once | INTRAMUSCULAR | Status: DC | PRN

## 2020-08-03 MED ORDER — FENTANYL CITRATE (PF) 100 MCG/2ML IJ SOLN: 25.0000 ug | INTRAMUSCULAR | Status: DC | PRN

## 2020-08-03 MED ORDER — ACETAMINOPHEN 10 MG/ML IV SOLN: 1000.0000 mg | Freq: Once | INTRAVENOUS | Status: DC | PRN

## 2020-08-03 MED ORDER — ONDANSETRON HCL 4 MG/2ML IJ SOLN
INTRAMUSCULAR | Status: DC | PRN
  Administered 2020-08-03: 4 mg via INTRAVENOUS

## 2020-08-03 MED ORDER — FENTANYL CITRATE (PF) 100 MCG/2ML IJ SOLN
INTRAMUSCULAR | Status: AC
  Filled 2020-08-03: qty 2

## 2020-08-03 MED ORDER — STERILE WATER FOR IRRIGATION IR SOLN
Status: DC | PRN
  Administered 2020-08-03: 500 mL

## 2020-08-03 MED ORDER — OXYCODONE HCL 5 MG PO TABS
5.0000 mg | ORAL_TABLET | Freq: Once | ORAL | Status: AC
  Administered 2020-08-03: 5 mg via ORAL

## 2020-08-03 MED ORDER — CEFAZOLIN SODIUM-DEXTROSE 2-4 GM/100ML-% IV SOLN
2.0000 g | INTRAVENOUS | Status: AC
  Administered 2020-08-03: 2 g via INTRAVENOUS
  Filled 2020-08-03: qty 100

## 2020-08-03 MED ORDER — FENTANYL CITRATE (PF) 100 MCG/2ML IJ SOLN
INTRAMUSCULAR | Status: DC | PRN
  Administered 2020-08-03: 50 ug via INTRAVENOUS
  Administered 2020-08-03: 25 ug via INTRAVENOUS

## 2020-08-03 MED ORDER — 0.9 % SODIUM CHLORIDE (POUR BTL) OPTIME
TOPICAL | Status: DC | PRN
  Administered 2020-08-03: 1000 mL

## 2020-08-03 MED ORDER — STERILE WATER FOR IRRIGATION IR SOLN
Status: DC | PRN
  Administered 2020-08-03: 3000 mL via INTRAVESICAL

## 2020-08-03 MED ORDER — DEXAMETHASONE SODIUM PHOSPHATE 4 MG/ML IJ SOLN
INTRAMUSCULAR | Status: DC | PRN
  Administered 2020-08-03: 10 mg via INTRAVENOUS

## 2020-08-03 MED ORDER — ORAL CARE MOUTH RINSE: 15.0000 mL | Freq: Once | OROMUCOSAL | Status: AC

## 2020-08-03 MED ORDER — CHLORHEXIDINE GLUCONATE 0.12 % MT SOLN
15.0000 mL | Freq: Once | OROMUCOSAL | Status: AC
  Administered 2020-08-03: 15 mL via OROMUCOSAL

## 2020-08-03 MED ORDER — ONDANSETRON HCL 4 MG/2ML IJ SOLN
INTRAMUSCULAR | Status: AC
  Filled 2020-08-03: qty 4

## 2020-08-03 MED ORDER — DEXAMETHASONE SODIUM PHOSPHATE 10 MG/ML IJ SOLN
INTRAMUSCULAR | Status: AC
  Filled 2020-08-03: qty 2

## 2020-08-03 MED ORDER — CIPROFLOXACIN HCL 250 MG PO TABS: 250.0000 mg | ORAL_TABLET | Freq: Two times a day (BID) | ORAL | 0 refills | Status: DC

## 2020-08-03 MED ORDER — MIDAZOLAM HCL 5 MG/5ML IJ SOLN
INTRAMUSCULAR | Status: DC | PRN
  Administered 2020-08-03: 1 mg via INTRAVENOUS

## 2020-08-03 MED ORDER — MIDAZOLAM HCL 2 MG/2ML IJ SOLN
INTRAMUSCULAR | Status: AC
  Filled 2020-08-03: qty 2

## 2020-08-03 MED ORDER — LACTATED RINGERS IV SOLN: INTRAVENOUS | Status: DC

## 2020-08-03 MED ORDER — PROPOFOL 10 MG/ML IV BOLUS
INTRAVENOUS | Status: AC
  Filled 2020-08-03: qty 40

## 2020-08-03 MED ORDER — LIDOCAINE 2% (20 MG/ML) 5 ML SYRINGE
INTRAMUSCULAR | Status: DC | PRN
  Administered 2020-08-03: 80 mg via INTRAVENOUS

## 2020-08-03 SURGICAL SUPPLY — 14 items
BAG URINE LEG 500ML (DRAIN) ×2 IMPLANT
BAG URO CATCHER STRL LF (MISCELLANEOUS) ×2 IMPLANT
CATH FOLEY 2WAY SLVR  5CC 18FR (CATHETERS) ×2
CATH FOLEY 2WAY SLVR 5CC 18FR (CATHETERS) ×1 IMPLANT
COVER WAND RF STERILE (DRAPES) IMPLANT
GLOVE BIOGEL M 8.0 STRL (GLOVE) ×2 IMPLANT
GOWN STRL REUS W/TWL XL LVL3 (GOWN DISPOSABLE) ×4 IMPLANT
KIT TURNOVER KIT A (KITS) ×2 IMPLANT
MANIFOLD NEPTUNE II (INSTRUMENTS) ×2 IMPLANT
PACK CYSTO (CUSTOM PROCEDURE TRAY) ×2 IMPLANT
PENCIL SMOKE EVACUATOR (MISCELLANEOUS) IMPLANT
SYSTEM UROLIFT (Male Continence) ×8 IMPLANT
TUBING CONNECTING 10 (TUBING) ×2 IMPLANT
WATER STERILE IRR 3000ML UROMA (IV SOLUTION) ×2 IMPLANT

## 2020-08-03 NOTE — Discharge Instructions (Signed)

## 2020-08-03 NOTE — Transfer of Care (Signed)
Immediate Anesthesia Transfer of Care Note  Patient: Gerald Williams  Procedure(s) Performed: CYSTOSCOPY WITH INSERTION OF UROLIFT (N/A )  Patient Location: PACU  Anesthesia Type:General  Level of Consciousness: sedated  Airway & Oxygen Therapy: Patient Spontanous Breathing  Post-op Assessment: Report given to RN, Post -op Vital signs reviewed and stable and Patient moving all extremities  Post vital signs: Reviewed and stable  Last Vitals:  Vitals Value Taken Time  BP 163/104 08/03/20 0841  Temp    Pulse 68 08/03/20 0842  Resp 12 08/03/20 0842  SpO2 100 % 08/03/20 0842  Vitals shown include unvalidated device data.  Last Pain:  Vitals:   08/03/20 0642  TempSrc:   PainSc: 0-No pain         Complications: No complications documented.

## 2020-08-03 NOTE — Op Note (Signed)
Preoperative diagnosis: BPH with obstructive symptomatology.  Postoperative diagnosis: Same  Principal procedure: Urolift procedure, with the placement of 4 implants.  Surgeon: Retta Diones  Anesthesia: Gen. with LMA  Complications: None  Drains: 18 French Foley catheter  Estimated blood loss: Less than 25 mL  Indications: 70 -year-old male with obstructive symptomatology secondary to BPH.  He is catheter dependent.  Management options including TURP with resection/ablation of the prostate as well as Urolift were discussed.  The patient has chosen to have a Urolift procedure.  He has been instructed to the procedure as well as risks and complications which include but are not limited to infection, bleeding, and inadequate treatment with the Urolift procedure alone, anesthetic complications, among others.  He understands these and desires to proceed.  Findings: Using the 17 French cystoscope, urethra and bladder were inspected.  There were no urethral lesions.  Prostatic urethra was obstructed secondary to bilobar hypertrophy.  The bladder was inspected circumferentially.  This revealed normal findings.  Description of procedure: The patient was properly identified in the holding area.  He received preoperative IV antibiotics.  He was taken to the operating room where general anesthetic was administered with the LMA.  He is placed in the dorsolithotomy position.  Genitalia and perineum were prepped and draped.  Proper timeout was performed.  A 74F cystoscope was inserted into the bladder.  Systematic inspection was performed, no urothelial lesions were noted.  There was mild erythema from long-term indwelling Foley catheter.  Prostatic urethra was quite short with mild obstruction of lateral lobes.  There was no median lobe.  The first implant was administered to the proximal right lobe.  The lobe was compressed, and with the implant device placed in the anterior urethra and named at the 10  o'clock position, the implant was delivered.  The second implant was administered in a mirror image on the patient's left side.  A third implant was delivered at the level of verumontanum, again compressing the anterior tissue and aiming towards the 10 o'clock position.  A fourth and final implant was administered in a mirror image on the patient's left side.   A final cystoscopy was conducted first to inspect the location and state of each implant and second, to confirm the presence of a continuous anterior channel was present through the prostatic urethra with irrigation flow turned off.  4 implants were delivered in total. Following this, the scope was removed and a 18 French Foley catheter was placed and hooked to dependent drainage.  He was then awakened and taken to the PACU in stable condition.  He tolerated the procedure well.

## 2020-08-03 NOTE — Anesthesia Procedure Notes (Signed)
Procedure Name: LMA Insertion Date/Time: 08/03/2020 8:01 AM Performed by: Theodosia Quay, CRNA Pre-anesthesia Checklist: Patient identified, Emergency Drugs available, Suction available and Patient being monitored Patient Re-evaluated:Patient Re-evaluated prior to induction Oxygen Delivery Method: Circle System Utilized Preoxygenation: Pre-oxygenation with 100% oxygen Induction Type: IV induction Ventilation: Mask ventilation without difficulty LMA: LMA with gastric port inserted LMA Size: 4.0 Number of attempts: 1 Placement Confirmation: positive ETCO2 Tube secured with: Tape Dental Injury: Teeth and Oropharynx as per pre-operative assessment

## 2020-08-03 NOTE — Interval H&P Note (Signed)
History and Physical Interval Note:  08/03/2020 7:49 AM  Gerald Williams  has presented today for surgery, with the diagnosis of BENIGN PROSTATIC HYPERPLASIA WITH RETENTION.  The various methods of treatment have been discussed with the patient and family. After consideration of risks, benefits and other options for treatment, the patient has consented to  Procedure(s) with comments: CYSTOSCOPY WITH INSERTION OF UROLIFT (N/A) - 30 MINS as a surgical intervention.  The patient's history has been reviewed, patient examined, no change in status, stable for surgery.  I have reviewed the patient's chart and labs.  Questions were answered to the patient's satisfaction.     Bertram Millard Katrin Grabel

## 2020-08-03 NOTE — Anesthesia Postprocedure Evaluation (Signed)
Anesthesia Post Note  Patient: Gerald Williams  Procedure(s) Performed: CYSTOSCOPY WITH INSERTION OF UROLIFT (N/A )     Patient location during evaluation: PACU Anesthesia Type: General Level of consciousness: awake and alert Pain management: pain level controlled Vital Signs Assessment: post-procedure vital signs reviewed and stable Respiratory status: spontaneous breathing, nonlabored ventilation, respiratory function stable and patient connected to nasal cannula oxygen Cardiovascular status: blood pressure returned to baseline and stable Postop Assessment: no apparent nausea or vomiting Anesthetic complications: no   No complications documented.  Last Vitals:  Vitals:   08/03/20 1100 08/03/20 1141  BP: (!) 156/109 (!) 153/93  Pulse: 65 64  Resp:    Temp:    SpO2: 100% 100%    Last Pain:  Vitals:   08/03/20 1141  TempSrc:   PainSc: 4                  Deitrich Steve P Caily Rakers

## 2020-08-04 ENCOUNTER — Encounter (HOSPITAL_COMMUNITY): Payer: Self-pay | Admitting: Urology

## 2020-08-04 DIAGNOSIS — R3914 Feeling of incomplete bladder emptying: Secondary | ICD-10-CM | POA: Diagnosis not present

## 2020-08-17 ENCOUNTER — Encounter (HOSPITAL_COMMUNITY): Payer: Self-pay

## 2020-08-17 ENCOUNTER — Ambulatory Visit (HOSPITAL_COMMUNITY)
Admission: EM | Admit: 2020-08-17 | Discharge: 2020-08-17 | Disposition: A | Discharge status: Home / Self Care | Payer: No Typology Code available for payment source | Attending: Emergency Medicine | Admitting: Emergency Medicine

## 2020-08-17 ENCOUNTER — Other Ambulatory Visit: Payer: Self-pay

## 2020-08-17 DIAGNOSIS — R600 Localized edema: Secondary | ICD-10-CM

## 2020-08-17 DIAGNOSIS — S81811A Laceration without foreign body, right lower leg, initial encounter: Secondary | ICD-10-CM

## 2020-08-17 MED ORDER — FUROSEMIDE 40 MG PO TABS
ORAL_TABLET | ORAL | Status: AC
  Filled 2020-08-17: qty 1

## 2020-08-17 MED ORDER — FUROSEMIDE 10 MG/ML IJ SOLN: 40.0000 mg | Freq: Once | INTRAMUSCULAR | Status: DC

## 2020-08-17 MED ORDER — LIDOCAINE-EPINEPHRINE 1 %-1:100000 IJ SOLN
INTRAMUSCULAR | Status: AC
  Filled 2020-08-17: qty 1

## 2020-08-17 MED ORDER — FUROSEMIDE 10 MG/ML IJ SOLN
INTRAMUSCULAR | Status: AC
  Filled 2020-08-17: qty 4

## 2020-08-17 MED ORDER — FUROSEMIDE 40 MG PO TABS
40.0000 mg | ORAL_TABLET | Freq: Once | ORAL | Status: AC
  Administered 2020-08-17: 40 mg via ORAL

## 2020-08-17 MED ORDER — BACITRACIN ZINC 500 UNIT/GM EX OINT: 1.0000 "application " | TOPICAL_OINTMENT | Freq: Two times a day (BID) | CUTANEOUS | 0 refills | Status: DC

## 2020-08-17 NOTE — Discharge Instructions (Addendum)
Cleanse area as normal, pat dry, apply antibiotic ointment and cover with band aid twice daily  Follow up after 10 days to have sutures removed  Wear compression stockings for a few hours throughout days  Elevate legs while sitting and lying to help decrease swelling  Primary care referral placed. Will be called to help you find a provider

## 2020-08-17 NOTE — ED Triage Notes (Signed)
Pt presents with left left laceration that occurred last night, when oxygen tank fell on it, skin tear noted to calf area of right leg

## 2020-08-17 NOTE — ED Provider Notes (Signed)
[facility]    CSN: [id number] Arrival date & time: 08/17/20  1028      History   Chief Complaint Chief Complaint  Patient presents with  . Laceration    HPI Gerald Williams is a 70 y.o. male.   Patient presents with left leg laceration after oxygen tank feel onto it this morning. No bleeding, Minimal pain, has not attempted to clean area.   Concerned with bilateral lower leg swelling first noticed this morning. Denies leg pain, warmth, wounds prior to this his laceration, shortness of breath, . Denies cardiac, renal history.   Past Medical History:  Diagnosis Date  . Emphysema   . Erectile dysfunction   . Hyperlipemia   . Hypertension   . Polysubstance abuse (HCC)    stopped using crack/cocaine December 4th 20-14  . PTSD (post-traumatic stress disorder)   . Sleep apnea     Patient Active Problem List   Diagnosis Date Noted  . Chronic respiratory failure with hypoxia (HCC) 09/18/2018  . Exercise hypoxemia 11/07/2017  . Frequent headaches 05/23/2017  . Acute pain of right knee 12/23/2016  . 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 Blood in stool 10/05/2015  . HTN (hypertension) 06/18/2015  . Cephalalgia 06/18/2015  . Dyslipidemia 06/12/2015  . COPD GOLD III 05/28/2015  . Peripheral neuropathy 05/20/2015  . COPD (chronic obstructive pulmonary disease) with emphysema (HCC) 05/20/2015  . PTSD (post-traumatic stress disorder) 05/20/2015  . Right carotid bruit 05/20/2015  . Frequent urination 05/20/2015  . Hematuria 07/15/2014  . History of tobacco abuse:  quit in ~ 06/2013  07/14/2014  . OSA on CPAP 07/14/2014  . Pre-diabetes 07/14/2014  . Claudication, R>L 07/14/2014  . Cocaine use, history of 03/26/2011  . Chest pain 03/26/2011    Past Surgical History:  Procedure Laterality Date  . CARDIAC CATHETERIZATION    . CYSTOSCOPY WITH INSERTION OF UROLIFT N/A 08/03/2020   Procedure: CYSTOSCOPY WITH INSERTION OF UROLIFT;  Surgeon:  Marcine Matar, MD;  Location: [facility];  Service: Urology;  Laterality: N/A;  30 MINS  . LEFT HEART CATHETERIZATION WITH CORONARY ANGIOGRAM N/A 07/15/2014   Procedure: LEFT HEART CATHETERIZATION WITH CORONARY ANGIOGRAM;  Surgeon: Lennette Bihari, MD;  Location: [facility] CATH LAB;  Service: Cardiovascular;  Laterality: N/A;       Home Medications    Prior to Admission medications   Medication Sig Start Date End Date Taking? Authorizing Provider  bacitracin ointment Apply 1 application topically 2 (two) times daily. 08/17/20  Yes Arlean Thies R, NP  albuterol (PROVENTIL HFA;VENTOLIN HFA) 108 (90 BASE) MCG/ACT inhaler Inhale 2 puffs into the lungs every 6 (six) hours as needed for shortness of breath or wheezing.    [provider]  albuterol (PROVENTIL) (2.5 MG/3ML) 0.083% nebulizer solution Take 2.5 mg by nebulization 2 (two) times daily.    [provider]  ciprofloxacin (CIPRO) 250 MG tablet Take 1 tablet (250 mg total) by mouth 2 (two) times daily. 08/03/20   Marcine Matar, MD  DULoxetine (CYMBALTA) 60 MG capsule Take 60 mg by mouth 2 (two) times daily.    [provider]  glucose blood (COOL BLOOD GLUCOSE TEST STRIPS) test strip Used to test blood sugar x2 daily---diagnosis code r73.03--for one touch verio flex 12/08/15   [name], MD  Lancets MISC Used to test blood sugar x2 daily---diagnosis code r73.03--for one touch verio flex 12/08/15   [name], MD  lidocaine (XYLOCAINE) 5 % ointment Apply 1  application topically as needed. To left lateral chest wall Patient not taking: No sig reported 05/07/19   Alessandra Bevels, MD  mirtazapine (REMERON) 30 MG tablet Take 30 mg by mouth at bedtime.    [provider]  NON FORMULARY at bedtime. CPAP - followed at [facility]    [provider]  nortriptyline (PAMELOR) 10 MG capsule Take 1 capsule (10 mg total) by mouth at bedtime. 09/23/15   Nita Sickle K, DO  oxybutynin (DITROPAN) 5 MG tablet Take 10  mg by mouth 2 (two) times daily as needed for bladder spasms. 07/13/20   [provider]  OXYGEN Inhale 4-5 L into the lungs continuous.    [provider]  Polyethyl Glycol-Propyl Glycol 0.4-0.3 % SOLN Apply 2 drops to eye as needed. Patient not taking: No sig reported 08/25/16   Nche, Bonna Gains, NP  valsartan (DIOVAN) 80 MG tablet Take 1 tablet (80 mg total) by mouth daily. 05/26/15   [name], MD    Family History Family History  Problem Relation Age of Onset  . Diabetes Mother        Deceased  . Heart disease Father        Deceased  . Heart attack Father        x5  . Cancer - Other Sister   . Healthy Son   . Healthy Daughter   . Heart attack Brother        Deceased  . Alcohol abuse Brother        Deceased    Social History Social History   Tobacco Use  . Smoking status: Former Smoker    Packs/day: 1.50    Years: 50.00    Pack years: 75.00    Types: Cigarettes    Start date: 04/26/1963    Quit date: 09/22/2013    Years since quitting: 6.9  . Smokeless tobacco: Never Used  . Tobacco comment: patient using the patch, losenges, and medication  Vaping Use  . Vaping Use: Never used  Substance Use Topics  . Alcohol use: No    Alcohol/week: 0.0 standard drinks    Comment: He was drinking 3-4 drinks nightly x 40 years, quit in 2014.    . Drug use: No    Comment: history of crack use and drug tested weekly at [facility]     Allergies   Patient has no known allergies.   Review of Systems Review of Systems  Defer to HPI    Physical Exam Triage Vital Signs ED Triage Vitals  Enc Vitals Group     BP 08/17/20 1127 (!) 168/98     Pulse Rate 08/17/20 1127 65     Resp 08/17/20 1127 20     Temp 08/17/20 1127 98.5 F (36.9 C)     Temp src --      SpO2 08/17/20 1127 100 %     Weight --      Height --      Head Circumference --      Peak Flow --      Pain Score 08/17/20 1126 0     Pain Loc --      Pain Edu? --      Excl. in GC? --    No  data found.  Updated Vital Signs BP (!) 168/98   Pulse 65   Temp 98.5 F (36.9 C)   Resp 20   SpO2 100%   Visual Acuity Right Eye Distance:   Left  Eye Distance:   Bilateral Distance:    Right Eye Near:   Left Eye Near:    Bilateral Near:     Physical Exam Constitutional:      Appearance: Normal appearance. He is normal weight.  HENT:     Head: Normocephalic.  Eyes:     Extraocular Movements: Extraocular movements intact.  Cardiovascular:     Rate and Rhythm: Normal rate and regular rhythm.     Pulses: Normal pulses.     Heart sounds: Normal heart sounds.  Pulmonary:     Effort: Pulmonary effort is normal.     Breath sounds: Normal breath sounds.     Comments: Wearing 2L 02 Musculoskeletal:        General: Normal range of motion.  Skin:         Comments: 3x2 Laceration with partial skin flap present on posterior right lower leg   Neurological:     Mental Status: He is alert and oriented to person, place, and time. Mental status is at baseline.  Psychiatric:        Mood and Affect: Mood normal.        Behavior: Behavior normal.        Thought Content: Thought content normal.        Judgment: Judgment normal.      UC Treatments / Results  Labs (all labs ordered are listed, but only abnormal results are displayed) Labs Reviewed - No data to display  EKG   Radiology No results found.  Procedures Laceration Repair  Date/Time: 08/17/2020 12:54 PM Performed by: Valinda Hoar, NP Authorized by: Valinda Hoar, NP   Consent:    Consent obtained:  Verbal   Consent given by:  Patient   Risks, benefits, and alternatives were discussed: yes     Risks discussed:  Infection and pain   Alternatives discussed:  No treatment Universal protocol:    Procedure explained and questions answered to patient or proxy's satisfaction: yes     Patient identity confirmed:  Verbally with patient Anesthesia:    Anesthesia method:  None Laceration details:     Location:  Leg   Length (cm):  3   Depth (mm):  2 Exploration:    Hemostasis achieved with:  Direct pressure   Wound exploration: entire depth of wound visualized     Contaminated: no   Treatment:    Area cleansed with:  Chlorhexidine   Amount of cleaning:  Standard   Irrigation solution:  Sterile saline   Irrigation method:  Tap   Visualized foreign bodies/material removed: no     Debridement:  None Skin repair:    Repair method:  Sutures   Suture size:  4-0   Suture material:  Prolene   Suture technique:  Simple interrupted   Number of sutures:  4 Approximation:    Approximation:  Close Repair type:    Repair type:  Simple Post-procedure details:    Dressing:  Antibiotic ointment and non-adherent dressing   Procedure completion:  Tolerated   (including critical care time)  Medications Ordered in UC Medications  furosemide (LASIX) tablet 40 mg (40 mg Oral Given 08/17/20 1236)    Initial Impression / Assessment and Plan / UC Course  I have reviewed the triage vital signs and the nursing notes.  Pertinent labs & imaging results that were available during my care of the patient were reviewed by me and considered in my medical decision making (see chart for details).  Laceration  right lower leg Edema bilateral lower legs  1. See procedure, cleanse, bacitracin bid with non adherent dressings, follow after 10 days for removal  2. Lasix 40 mg oral now 3. Compression stockings during daytime 4. Elevate extremities while sitting and lying 5. PCP referral placed  Final Clinical Impressions(s) / UC Diagnoses   Final diagnoses:  Laceration of right lower leg, initial encounter  Edema of both lower legs     Discharge Instructions     Cleanse area as normal, pat dry, apply antibiotic ointment and cover with band aid twice daily  Follow up after 10 days to have sutures removed  Wear compression stockings for a few hours throughout days  Elevate legs while sitting and  lying to help decrease swelling  Primary care referral placed. Will be called to help you find a provider   ED Prescriptions    Medication Sig Dispense Auth. Provider   bacitracin ointment Apply 1 application topically 2 (two) times daily. 120 g Valinda Hoar, NP     PDMP not reviewed this encounter.   Valinda Hoar, NP 08/17/20 1259

## 2020-08-17 NOTE — ED Triage Notes (Signed)
Pt also has co/ lwer leg edema, worse in left that began this morning

## 2020-08-20 DIAGNOSIS — J449 Chronic obstructive pulmonary disease, unspecified: Secondary | ICD-10-CM | POA: Diagnosis not present

## 2020-08-27 DIAGNOSIS — N183 Chronic kidney disease, stage 3 unspecified: Secondary | ICD-10-CM | POA: Diagnosis not present

## 2020-08-27 DIAGNOSIS — E785 Hyperlipidemia, unspecified: Secondary | ICD-10-CM | POA: Diagnosis not present

## 2020-08-27 DIAGNOSIS — R7303 Prediabetes: Secondary | ICD-10-CM | POA: Diagnosis not present

## 2020-08-27 DIAGNOSIS — R600 Localized edema: Secondary | ICD-10-CM | POA: Diagnosis not present

## 2020-08-27 DIAGNOSIS — Z1159 Encounter for screening for other viral diseases: Secondary | ICD-10-CM | POA: Diagnosis not present

## 2020-08-27 DIAGNOSIS — J9611 Chronic respiratory failure with hypoxia: Secondary | ICD-10-CM | POA: Diagnosis not present

## 2020-08-27 DIAGNOSIS — G4733 Obstructive sleep apnea (adult) (pediatric): Secondary | ICD-10-CM | POA: Diagnosis not present

## 2020-08-27 DIAGNOSIS — Z79899 Other long term (current) drug therapy: Secondary | ICD-10-CM | POA: Diagnosis not present

## 2020-09-03 ENCOUNTER — Ambulatory Visit (HOSPITAL_COMMUNITY)
Admission: EM | Admit: 2020-09-03 | Discharge: 2020-09-03 | Disposition: A | Discharge status: Home / Self Care | Payer: No Typology Code available for payment source | Attending: Emergency Medicine | Admitting: Emergency Medicine

## 2020-09-03 ENCOUNTER — Other Ambulatory Visit: Payer: Self-pay

## 2020-09-03 ENCOUNTER — Encounter (HOSPITAL_COMMUNITY): Payer: Self-pay

## 2020-09-03 DIAGNOSIS — Z5189 Encounter for other specified aftercare: Secondary | ICD-10-CM

## 2020-09-03 DIAGNOSIS — S81811D Laceration without foreign body, right lower leg, subsequent encounter: Secondary | ICD-10-CM

## 2020-09-03 MED ORDER — DOXYCYCLINE HYCLATE 100 MG PO CAPS: 100.0000 mg | ORAL_CAPSULE | Freq: Two times a day (BID) | ORAL | 0 refills | Status: DC

## 2020-09-03 NOTE — Discharge Instructions (Addendum)
Take antibiotic twice a day for the next 5 days, take with full glass of water   Follow up if tenderness continues, redness occurs, pus starts to come out of site, swelling occurs for reevaluation

## 2020-09-03 NOTE — ED Triage Notes (Signed)
Pt is here today for suture removal. Pt states he has had them in for 2 weeks. Pt presents with some swelling and hardness around affected area.

## 2020-09-03 NOTE — ED Provider Notes (Signed)
[facility]    CSN: [id number] Arrival date & time: 09/03/20  1308      History   Chief Complaint Chief Complaint  Patient presents with  . Suture / Staple Removal    HPI Gerald Williams is a 70 y.o. male.   Patient presents for suture removal and evaluation of laceration.  4 sutures were placed 2 weeks ago.  Area has been tender for the last week.  Denies swelling, redness, warmth, discharge, fevers, chills.  Has been using Neosporin for area daily.  Past Medical History:  Diagnosis Date  . Emphysema   . Erectile dysfunction   . Hyperlipemia   . Hypertension   . Polysubstance abuse (HCC)    stopped using crack/cocaine December 4th 20-14  . PTSD (post-traumatic stress disorder)   . Sleep apnea     Patient Active Problem List   Diagnosis Date Noted  . Chronic respiratory failure with hypoxia (HCC) 09/18/2018  . Exercise hypoxemia 11/07/2017  . Frequent headaches 05/23/2017  . Acute pain of right knee 12/23/2016  . 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 Blood in stool 10/05/2015  . HTN (hypertension) 06/18/2015  . Cephalalgia 06/18/2015  . Dyslipidemia 06/12/2015  . COPD GOLD III 05/28/2015  . Peripheral neuropathy 05/20/2015  . COPD (chronic obstructive pulmonary disease) with emphysema (HCC) 05/20/2015  . PTSD (post-traumatic stress disorder) 05/20/2015  . Right carotid bruit 05/20/2015  . Frequent urination 05/20/2015  . Hematuria 07/15/2014  . History of tobacco abuse:  quit in ~ 06/2013  07/14/2014  . OSA on CPAP 07/14/2014  . Pre-diabetes 07/14/2014  . Claudication, R>L 07/14/2014  . Cocaine use, history of 03/26/2011  . Chest pain 03/26/2011    Past Surgical History:  Procedure Laterality Date  . CARDIAC CATHETERIZATION    . CYSTOSCOPY WITH INSERTION OF UROLIFT N/A 08/03/2020   Procedure: CYSTOSCOPY WITH INSERTION OF UROLIFT;  Surgeon: Marcine Matar, MD;  Location: [facility];  Service: Urology;  Laterality: N/A;  30 MINS   . LEFT HEART CATHETERIZATION WITH CORONARY ANGIOGRAM N/A 07/15/2014   Procedure: LEFT HEART CATHETERIZATION WITH CORONARY ANGIOGRAM;  Surgeon: Lennette Bihari, MD;  Location: [facility] CATH LAB;  Service: Cardiovascular;  Laterality: N/A;       Home Medications    Prior to Admission medications   Medication Sig Start Date End Date Taking? Authorizing Provider  doxycycline (VIBRAMYCIN) 100 MG capsule Take 1 capsule (100 mg total) by mouth 2 (two) times daily. 09/03/20  Yes [place] Warriner R, NP  albuterol (PROVENTIL HFA;VENTOLIN HFA) 108 (90 BASE) MCG/ACT inhaler Inhale 2 puffs into the lungs every 6 (six) hours as needed for shortness of breath or wheezing.    [provider]  albuterol (PROVENTIL) (2.5 MG/3ML) 0.083% nebulizer solution Take 2.5 mg by nebulization 2 (two) times daily.    [provider]  bacitracin ointment Apply 1 application topically 2 (two) times daily. 08/17/20   Tricia Oaxaca, Elita Boone, NP  ciprofloxacin (CIPRO) 250 MG tablet Take 1 tablet (250 mg total) by mouth 2 (two) times daily. 08/03/20   Marcine Matar, MD  DULoxetine (CYMBALTA) 60 MG capsule Take 60 mg by mouth 2 (two) times daily.    [provider]  glucose blood (COOL BLOOD GLUCOSE TEST STRIPS) test strip Used to test blood sugar x2 daily---diagnosis code r73.03--for one touch verio flex 12/08/15   [name], MD  Lancets MISC Used to test blood sugar x2 daily---diagnosis code r73.03--for one touch verio flex  12/08/15   [name], MD  lidocaine (XYLOCAINE) 5 % ointment Apply 1 application topically as needed. To left lateral chest wall Patient not taking: No sig reported 05/07/19   Alessandra Bevels, MD  mirtazapine (REMERON) 30 MG tablet Take 30 mg by mouth at bedtime.    [provider]  NON FORMULARY at bedtime. CPAP - followed at [facility]    [provider]  nortriptyline (PAMELOR) 10 MG capsule Take 1 capsule (10 mg total) by mouth at bedtime. 09/23/15   Nita Sickle K, DO   oxybutynin (DITROPAN) 5 MG tablet Take 10 mg by mouth 2 (two) times daily as needed for bladder spasms. 07/13/20   [provider]  OXYGEN Inhale 4-5 L into the lungs continuous.    [provider]  Polyethyl Glycol-Propyl Glycol 0.4-0.3 % SOLN Apply 2 drops to eye as needed. Patient not taking: No sig reported 08/25/16   Nche, Bonna Gains, NP  valsartan (DIOVAN) 80 MG tablet Take 1 tablet (80 mg total) by mouth daily. 05/26/15   [name], MD    Family History Family History  Problem Relation Age of Onset  . Diabetes Mother        Deceased  . Heart disease Father        Deceased  . Heart attack Father        x5  . Cancer - Other Sister   . Healthy Son   . Healthy Daughter   . Heart attack Brother        Deceased  . Alcohol abuse Brother        Deceased    Social History Social History   Tobacco Use  . Smoking status: Former Smoker    Packs/day: 1.50    Years: 50.00    Pack years: 75.00    Types: Cigarettes    Start date: 04/26/1963    Quit date: 09/22/2013    Years since quitting: 6.9  . Smokeless tobacco: Never Used  . Tobacco comment: patient using the patch, losenges, and medication  Vaping Use  . Vaping Use: Never used  Substance Use Topics  . Alcohol use: No    Alcohol/week: 0.0 standard drinks    Comment: He was drinking 3-4 drinks nightly x 40 years, quit in 2014.    . Drug use: No    Comment: history of crack use and drug tested weekly at the [facility]     Allergies   Patient has no known allergies.   Review of Systems Review of Systems  Constitutional: Negative.   Respiratory: Negative.   Cardiovascular: Negative.   Skin: Positive for wound. Negative for color change, pallor and rash.  Neurological: Negative.      Physical Exam Triage Vital Signs ED Triage Vitals  Enc Vitals Group     BP 09/03/20 0830 (!) 171/84     Pulse Rate 09/03/20 0830 81     Resp 09/03/20 0830 19     Temp 09/03/20 0830 98 F (36.7 C)     Temp Source  09/03/20 0830 Oral     SpO2 09/03/20 0830 99 %     Weight --      Height --      Head Circumference --      Peak Flow --      Pain Score 09/03/20 0827 0     Pain Loc --      Pain Edu? --      Excl. in GC? --  No data found.  Updated Vital Signs BP (!) 171/84 (BP Location: Left Arm)   Pulse 81   Temp 98 F (36.7 C) (Oral)   Resp 19   SpO2 99%   Visual Acuity Right Eye Distance:   Left Eye Distance:   Bilateral Distance:    Right Eye Near:   Left Eye Near:    Bilateral Near:     Physical Exam Constitutional:      Appearance: Normal appearance. He is normal weight.  HENT:     Head: Normocephalic.  Eyes:     Extraocular Movements: Extraocular movements intact.  Pulmonary:     Effort: Pulmonary effort is normal.  Musculoskeletal:        General: Normal range of motion.  Skin:    Comments: Healed laceration present over right calf.  Mild swelling and tenderness around site.  No erythema noticed, no drainage noted.  2+ popliteal.  Sutures noticed to not be in place.   Neurological:     General: No focal deficit present.     Mental Status: He is alert and oriented to person, place, and time. Mental status is at baseline.  Psychiatric:        Mood and Affect: Mood normal.        Behavior: Behavior normal.        Thought Content: Thought content normal.        Judgment: Judgment normal.      UC Treatments / Results  Labs (all labs ordered are listed, but only abnormal results are displayed) Labs Reviewed - No data to display  EKG   Radiology No results found.  Procedures Procedures (including critical care time)  Medications Ordered in UC Medications - No data to display  Initial Impression / Assessment and Plan / UC Course  I have reviewed the triage vital signs and the nursing notes.  Pertinent labs & imaging results that were available during my care of the patient were reviewed by me and considered in my medical decision making (see chart for  details).  Wound check  1. Doxycycline 100 mg bid for 5 days 2. Follow up for drainage, continued tenderness, increased swelling,  Final Clinical Impressions(s) / UC Diagnoses   Final diagnoses:  Encounter for wound re-check     Discharge Instructions     Take antibiotic twice a day for the next 5 days, take with full glass of water   Follow up if tenderness continues, redness occurs, pus starts to come out of site, swelling occurs for reevaluation    ED Prescriptions    Medication Sig Dispense Auth. Provider   doxycycline (VIBRAMYCIN) 100 MG capsule Take 1 capsule (100 mg total) by mouth 2 (two) times daily. 10 capsule Valinda Hoar, NP     PDMP not reviewed this encounter.   Valinda Hoar, [place] 09/03/20 [phone]

## 2020-09-19 DIAGNOSIS — J449 Chronic obstructive pulmonary disease, unspecified: Secondary | ICD-10-CM | POA: Diagnosis not present

## 2020-09-24 ENCOUNTER — Other Ambulatory Visit: Payer: Self-pay

## 2020-09-24 ENCOUNTER — Ambulatory Visit: Payer: Medicare Other | Admitting: Allergy & Immunology

## 2020-09-24 ENCOUNTER — Encounter: Payer: Self-pay | Admitting: Allergy & Immunology

## 2020-09-24 VITALS — BP 140/88 | HR 85 | Temp 98.1°F | Resp 16 | Ht 65.5 in | Wt 174.8 lb

## 2020-09-24 DIAGNOSIS — J449 Chronic obstructive pulmonary disease, unspecified: Secondary | ICD-10-CM

## 2020-09-24 DIAGNOSIS — R0981 Nasal congestion: Secondary | ICD-10-CM

## 2020-09-24 DIAGNOSIS — T485X5A Adverse effect of other anti-common-cold drugs, initial encounter: Secondary | ICD-10-CM

## 2020-09-24 DIAGNOSIS — J310 Chronic rhinitis: Secondary | ICD-10-CM

## 2020-09-24 MED ORDER — AZELASTINE HCL 0.1 % NA SOLN: 1.0000 | Freq: Two times a day (BID) | NASAL | 5 refills | Status: DC

## 2020-09-24 MED ORDER — ALBUTEROL SULFATE HFA 108 (90 BASE) MCG/ACT IN AERS: 2.0000 | INHALATION_SPRAY | Freq: Four times a day (QID) | RESPIRATORY_TRACT | 1 refills | Status: DC | PRN

## 2020-09-24 NOTE — Patient Instructions (Addendum)
1. Rhinitis medicamentosa - The Zicam you are using contains oxymetazoline, which is essentially Afrin. - Chronic use of this causes rebound congestion, which is likely what you are experiencing. - Stop this medication now. - Start prednisone dose pack provided today. - Continue with Flonase but increase to one spray per nostril TWICE DAILY. - Add on Astelin one spray per nostril TWICE DAILY.  - We are getting lab work to look for environmental allergies.   2. Chronic obstructive pulmonary disease, unspecified COPD type (HCC) - Lung testing was at 41% FEV1, which is fairly bad. - We are not going to make any medication changes at this time. - Continue with the Symbicort two puffs twice daily as well as Combivent as needed. - Call [facility] to get a new Pulmonologist.   3. Return in about 2 months (around 11/24/2020).    Please inform us of any Emergency Department visits, hospitalizations, or changes in symptoms. Call us before going to the ED for breathing or allergy symptoms since we might be able to fit you in for a sick visit. Feel free to contact us anytime with any questions, problems, or concerns.   It was a pleasure to meet you today!  Websites that have reliable patient information: 1. American Academy of Asthma, Allergy, and Immunology: www.aaaai.org 2. Food Allergy Research and Education (FARE): foodallergy.org 3. Mothers of Asthmatics: http://www.asthmacommunitynetwork.org 4. American College of Allergy, Asthma, and Immunology: www.acaai.org   COVID-19 Vaccine Information can be found at: [email or web address] For questions related to vaccine distribution or appointments, please email [email or web address] or call [phone].   We realize that you might be concerned about having an allergic reaction to the COVID19 vaccines. To help with that concern, WE ARE OFFERING THE COVID19 VACCINES IN OUR OFFICE! Ask  the front desk for dates!     "Like" [place] on Facebook and Instagram for our latest updates!      A healthy democracy works best when [employer] participate! Make sure you are registered to vote! If you have moved or changed any of your contact information, you will need to get this updated before voting!  In some cases, you MAY be able to register to vote online: [email or web address]

## 2020-09-24 NOTE — Progress Notes (Signed)
NEW PATIENT  Date of Service/Encounter:  09/24/20  Consult requested by: [facility]   Assessment:   COPD - followed by Dr. 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 Rhinitis medicamentosa - The Zicam you are using contains oxymetazoline, which is essentially Afrin. - Chronic use of this causes rebound congestion, which is likely what you are experiencing. - Stop this medication now. - Start prednisone dose pack provided today. - Continue with Flonase but increase to one spray per nostril TWICE DAILY. - Add on Astelin one spray per nostril TWICE DAILY.  - We are getting lab work to look for environmental allergies.   2. Chronic obstructive pulmonary disease - Lung testing was at 41% FEV1, which is fairly bad. - We are not going to make any medication changes at this time. - Continue with the Symbicort two puffs twice daily as well as Combivent as needed. - Call [facility] to get a new Pulmonologist.   3. Return in about 2 months (around 11/24/2020).    This note in its entirety was forwarded to the Provider who requested this consultation.  Subjective:   Gerald Williams is a 70 y.o. male presenting today for evaluation of  Chief Complaint  Patient presents with  . Allergic Rhinitis     Says he stays congested. He has Emphysema and he is unsure if the oxygen causes his congestion.   . Asthma    Has Emphysema so he is unsure if he has asthma.     Delmus Warwick has a history of the following: Patient Active Problem List   Diagnosis Date Noted  . Chronic respiratory failure with hypoxia (HCC) 09/18/2018  . Exercise hypoxemia 11/07/2017  . Frequent headaches 05/23/2017  . Acute pain of right knee 12/23/2016  . 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 Blood in stool 10/05/2015  . HTN (hypertension) 06/18/2015  . Cephalalgia 06/18/2015  . Dyslipidemia 06/12/2015  . COPD GOLD  III 05/28/2015  . Peripheral neuropathy 05/20/2015  . COPD (chronic obstructive pulmonary disease) with emphysema (HCC) 05/20/2015  . PTSD (post-traumatic stress disorder) 05/20/2015  . Right carotid bruit 05/20/2015  . Frequent urination 05/20/2015  . Hematuria 07/15/2014  . History of tobacco abuse:  quit in ~ 06/2013  07/14/2014  . OSA on CPAP 07/14/2014  . Pre-diabetes 07/14/2014  . Claudication, R>L 07/14/2014  . Cocaine use, history of 03/26/2011  . Chest pain 03/26/2011    History obtained from: chart review and patient.  Gerald Williams was referred by [facility], [facility].     Gerald Williams is a 70 y.o. male presenting for an evaluation of chronic congestion.   Asthma/Respiratory Symptom History: He was followed by Dr. Sherene Sires but he is going to be getting a new doctor for whatever reason. He remains on Symbicort two puffs twice daily. He is also using Combivent as needed. He has been diagnosed with emphysema as of 7 years ago. He is on 5-6 L O2 daily. He stopped smoking around 10 years ago.  He is actually interested in lung reduction surgery.  None of his pulmonologist have signed off on this, however.  Allergic Rhinitis Symptom History: He reports that he is using Zicam and decongestants to help with nasal congestant. He is on Flonase every day but then it comes right back. He denies using Afrin at all.  I did confirm that this is the Zicam below that he is using, which  does contain oxymetazoline.  His congestion does not seem to get worse during a particular time of the year.  There are no exacerbating environments either.    Otherwise, there is no history of other atopic diseases, including food allergies, drug allergies, stinging insect allergies, eczema, urticaria or contact dermatitis. There is no significant infectious history. Vaccinations are up to date.    Past Medical History: Patient Active Problem List   Diagnosis Date Noted  . Chronic respiratory failure with  hypoxia (HCC) 09/18/2018  . Exercise hypoxemia 11/07/2017  . Frequent headaches 05/23/2017  . Acute pain of right knee 12/23/2016  . 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 Blood in stool 10/05/2015  . HTN (hypertension) 06/18/2015  . Cephalalgia 06/18/2015  . Dyslipidemia 06/12/2015  . COPD GOLD III 05/28/2015  . Peripheral neuropathy 05/20/2015  . COPD (chronic obstructive pulmonary disease) with emphysema (HCC) 05/20/2015  . PTSD (post-traumatic stress disorder) 05/20/2015  . Right carotid bruit 05/20/2015  . Frequent urination 05/20/2015  . Hematuria 07/15/2014  . History of tobacco abuse:  quit in ~ 06/2013  07/14/2014  . OSA on CPAP 07/14/2014  . Pre-diabetes 07/14/2014  . Claudication, R>L 07/14/2014  . Cocaine use, history of 03/26/2011  . Chest pain 03/26/2011    Medication List:  Allergies as of 09/24/2020      Reactions   Budeson-glycopyrrol-formoterol    Other reaction(s): Retention of urine, Constipation, Retention of urine, Constipation   Gabapentin    Other reaction(s): Dizziness   Nortriptyline    Other reaction(s): Drowsy   Oxcarbazepine Nausea And Vomiting   Tiotropium    Other reaction(s): Cough, Dysphagia, Cough, Dysphagia   Tiotropium Bromide-olodaterol Itching   Olodaterol Hives, Rash      Medication List       Accurate as of September 24, 2020 12:49 PM. If you have any questions, ask your nurse or doctor.        STOP taking these medications   doxycycline 100 MG capsule Commonly known as: VIBRAMYCIN Stopped by: Alfonse Spruce, MD   DULoxetine 60 MG capsule Commonly known as: CYMBALTA Stopped by: Alfonse Spruce, MD   nortriptyline 10 MG capsule Commonly known as: PAMELOR Stopped by: Alfonse Spruce, MD   oxybutynin 5 MG tablet Commonly known as: DITROPAN Stopped by: Alfonse Spruce, MD   Polyethyl Glycol-Propyl Glycol 0.4-0.3 % Soln Stopped by: Alfonse Spruce, MD   valsartan 80 MG  tablet Commonly known as: Diovan Stopped by: Alfonse Spruce, MD     TAKE these medications   albuterol (2.5 MG/3ML) 0.083% nebulizer solution Commonly known as: PROVENTIL Take 2.5 mg by nebulization 2 (two) times daily.   albuterol 108 (90 Base) MCG/ACT inhaler Commonly known as: VENTOLIN HFA Inhale 2 puffs into the lungs every 6 (six) hours as needed for shortness of breath or wheezing.   atorvastatin 80 MG tablet Commonly known as: LIPITOR Take by mouth. As needed   azelastine 0.1 % nasal spray Commonly known as: ASTELIN Place 1 spray into both nostrils 2 (two) times daily. Use in each nostril as directed Started by: Alfonse Spruce, MD   bacitracin ointment Apply 1 application topically 2 (two) times daily.   budesonide-formoterol 160-4.5 MCG/ACT inhaler Commonly known as: SYMBICORT Inhale 2 puffs into the lungs in the morning and at bedtime.   carboxymethylcellulose 0.5 % Soln Commonly known as: REFRESH PLUS Apply to eye.   ciprofloxacin 250 MG tablet Commonly known as: Cipro Take  1 tablet (250 mg total) by mouth 2 (two) times daily.   fluticasone 50 MCG/ACT nasal spray Commonly known as: FLONASE Place into the nose.   glucose blood test strip Commonly known as: Cool Blood Glucose Test Strips Used to test blood sugar x2 daily---diagnosis code r73.03--for one touch verio flex   guaiFENesin 600 MG 12 hr tablet Commonly known as: MUCINEX Take by mouth.   Ipratropium-Albuterol 20-100 MCG/ACT Aers respimat Commonly known as: COMBIVENT Inhale into the lungs.   Lancets Misc Used to test blood sugar x2 daily---diagnosis code r73.03--for one touch verio flex   lidocaine 5 % ointment Commonly known as: XYLOCAINE Apply 1 application topically as needed. To left lateral chest wall   lisinopril 40 MG tablet Commonly known as: ZESTRIL Take 1 tablet by mouth daily.   mirtazapine 30 MG tablet Commonly known as: REMERON Take 30 mg by mouth at  bedtime.   NON FORMULARY at bedtime. CPAP - [facility] at [facility]   OXYGEN Inhale 4-5 L into the lungs continuous.   sildenafil 100 MG tablet Commonly known as: VIAGRA Take by mouth.       Birth History: non-contributory  Developmental History: non-contributory  Past Surgical History: Past Surgical History:  Procedure Laterality Date  . CARDIAC CATHETERIZATION    . CYSTOSCOPY WITH INSERTION OF UROLIFT N/A 08/03/2020   Procedure: CYSTOSCOPY WITH INSERTION OF UROLIFT;  Surgeon: Marcine Matar, MD;  Location: [facility];  Service: Urology;  Laterality: N/A;  30 MINS  . LEFT HEART CATHETERIZATION WITH CORONARY ANGIOGRAM N/A 07/15/2014   Procedure: LEFT HEART CATHETERIZATION WITH CORONARY ANGIOGRAM;  Surgeon: Lennette Bihari, MD;  Location: [facility] CATH LAB;  Service: Cardiovascular;  Laterality: N/A;     Family History: Family History  Problem Relation Age of Onset  . Diabetes Mother        Deceased  . Heart disease Father        Deceased  . Heart attack Father        x5  . Cancer - Other Sister   . Healthy Son   . Healthy Daughter   . Heart attack Brother        Deceased  . Alcohol abuse Brother        Deceased     Social History: [place] lives at home with his family.  He was in the [employer] for almost 20 years.  He served in [place]. He is no longer working at this point in time. He stopped smoking ten years ago, but smoked onn and off since teenage years.    Review of Systems  Constitutional: Negative.  Negative for chills, fever, malaise/fatigue and weight loss.  HENT: Positive for congestion. Negative for ear discharge and ear pain.   Eyes: Negative for pain, discharge and redness.  Respiratory: Positive for cough, sputum production, shortness of breath and wheezing.   Cardiovascular: Negative.  Negative for chest pain and palpitations.  Gastrointestinal: Negative for abdominal pain, constipation, diarrhea, heartburn, nausea and vomiting.  Skin: Negative.  Negative  for itching and rash.  Neurological: Negative for dizziness and headaches.  Endo/Heme/Allergies: Negative for environmental allergies. Does not bruise/bleed easily.       Objective:   Blood pressure 140/88, pulse 85, temperature 98.1 F (36.7 C), resp. rate 16, height 5' 5.5" (1.664 m), weight 174 lb 12.8 oz (79.3 kg), SpO2 99 %. Body mass index is 28.65 kg/m.   Physical Exam:   Physical Exam Vitals reviewed.  Constitutional:      Appearance:  He is well-developed.  HENT:     Head: Normocephalic and atraumatic.     Right Ear: Tympanic membrane, ear canal and external ear normal. No drainage, swelling or tenderness. Tympanic membrane is not injected, scarred, erythematous, retracted or bulging.     Left Ear: Tympanic membrane, ear canal and external ear normal. No drainage, swelling or tenderness. Tympanic membrane is not injected, scarred, erythematous, retracted or bulging.     Nose: No nasal deformity, septal deviation, mucosal edema or rhinorrhea.     Right Turbinates: Enlarged and swollen.     Left Turbinates: Enlarged and swollen.     Right Sinus: No maxillary sinus tenderness or frontal sinus tenderness.     Left Sinus: No maxillary sinus tenderness or frontal sinus tenderness.     Comments: Erythematous turbinates.  No nasal polyps.    Mouth/Throat:     Mouth: Mucous membranes are not pale and not dry.     Pharynx: Uvula midline.  Eyes:     General:        Right eye: No discharge.        Left eye: No discharge.     Conjunctiva/sclera: Conjunctivae normal.     Right eye: Right conjunctiva is not injected. No chemosis.    Left eye: Left conjunctiva is not injected. No chemosis.    Pupils: Pupils are equal, round, and reactive to light.  Cardiovascular:     Rate and Rhythm: Normal rate and regular rhythm.     Heart sounds: Normal heart sounds.  Pulmonary:     Effort: Pulmonary effort is normal. No tachypnea, accessory muscle usage or respiratory distress.     Breath  sounds: Normal breath sounds. No wheezing, rhonchi or rales.     Comments: Decreased air movement at the bases.  Wearing oxygen nasal cannula. Chest:     Chest wall: No tenderness.  Abdominal:     Tenderness: There is no abdominal tenderness. There is no guarding or rebound.  Lymphadenopathy:     Head:     Right side of head: No submandibular, tonsillar or occipital adenopathy.     Left side of head: No submandibular, tonsillar or occipital adenopathy.     Cervical: No cervical adenopathy.  Skin:    Coloration: Skin is not pale.     Findings: No abrasion, erythema, petechiae or rash. Rash is not papular, urticarial or vesicular.  Neurological:     Mental Status: He is alert.      Diagnostic studies:    Spirometry: results abnormal (FEV1: 0.96/41%, FVC: 1.93/61%, FEV1/FVC: 50%).    Spirometry consistent with mixed obstructive and restrictive disease.   Allergy Studies: none          Malachi Bonds, MD [facility]

## 2020-09-30 LAB — ALLERGENS W/COMP RFLX AREA 2
Alternaria Alternata IgE: <0.1 kU/L
Aspergillus Fumigatus IgE: <0.1 kU/L
Bermuda Grass IgE: <0.1 kU/L
Cedar, Mountain IgE: <0.1 kU/L
Cladosporium Herbarum IgE: <0.1 kU/L
Cockroach, German IgE: <0.1 kU/L
Common Silver Birch IgE: <0.1 kU/L
Cottonwood IgE: <0.1 kU/L
D Farinae IgE: <0.1 kU/L
D Pteronyssinus IgE: <0.1 kU/L
E001-IgE Cat Dander: <0.1 kU/L
E005-IgE Dog Dander: <0.1 kU/L
Elm, American IgE: <0.1 kU/L
IgE (Immunoglobulin E), Serum: 1490 IU/mL — ABNORMAL HIGH (ref 6–495)
Johnson Grass IgE: <0.1 kU/L
Maple/Box Elder IgE: <0.1 kU/L
Mouse Urine IgE: <0.1 kU/L
Oak, White IgE: <0.1 kU/L
Pecan, Hickory IgE: <0.1 kU/L
Penicillium Chrysogen IgE: <0.1 kU/L
Pigweed, Rough IgE: <0.1 kU/L
Ragweed, Short IgE: <0.1 kU/L
Sheep Sorrel IgE Qn: <0.1 kU/L
Timothy Grass IgE: <0.1 kU/L
White Mulberry IgE: <0.1 kU/L

## 2020-09-30 LAB — ALLERGEN PROFILE, MOLD
Aureobasidi Pullulans IgE: <0.1 kU/L
Candida Albicans IgE: 4.02 kU/L — AB
M009-IgE Fusarium proliferatum: <0.1 kU/L
M014-IgE Epicoccum purpur: <0.1 kU/L
Mucor Racemosus IgE: <0.1 kU/L
Phoma Betae IgE: <0.1 kU/L
Setomelanomma Rostrat: <0.1 kU/L
Stemphylium Herbarum IgE: 0.11 kU/L — AB

## 2020-10-08 ENCOUNTER — Telehealth: Payer: Self-pay | Admitting: Allergy & Immunology

## 2020-10-08 DIAGNOSIS — J449 Chronic obstructive pulmonary disease, unspecified: Secondary | ICD-10-CM

## 2020-10-08 MED ORDER — PREDNISONE 10 MG PO TABS: ORAL_TABLET | ORAL | 0 refills | Status: DC

## 2020-10-08 MED ORDER — IPRATROPIUM BROMIDE 0.03 % NA SOLN: 1.0000 | Freq: Three times a day (TID) | NASAL | 5 refills | Status: DC | PRN

## 2020-10-08 NOTE — Telephone Encounter (Signed)
Informed pt of the medications and confirmed pharmacy sent in meds while on the phone and also informed him of the referral to pulmonology

## 2020-10-08 NOTE — Addendum Note (Signed)
Addended by: Berna Bue on: 10/08/2020 04:50 PM   Modules accepted: Orders

## 2020-10-08 NOTE — Telephone Encounter (Signed)
Patient called requesting some more prednisone to be sent in for him [facility] [address]). Patient is requesting a referral for a pulmonologist be placed for him as well.  Best contact number: [phone]

## 2020-10-08 NOTE — Telephone Encounter (Signed)
Stop the Astelin and start ipratropium 0.03% one spray pre nostril every 8 hours as needed.   You can send in a prednisone course again (baby pack equivalent).   Pulmonology referral placed.   Malachi Bonds, MD [facility]

## 2020-10-08 NOTE — Addendum Note (Signed)
Addended by: Alfonse Spruce on: 10/08/2020 04:24 PM   Modules accepted: Orders

## 2020-10-08 NOTE — Telephone Encounter (Signed)
Returning a nurse phone call// [phone]

## 2020-10-08 NOTE — Telephone Encounter (Addendum)
Pt is still having a runny nose a lot even with doing the nasal spray, and he is breathing better with the prednisone. He is wanting a referral to pulmonology so he can speak to them about a lung reduction

## 2020-10-12 ENCOUNTER — Ambulatory Visit: Payer: No Typology Code available for payment source | Admitting: Podiatry

## 2020-10-29 ENCOUNTER — Other Ambulatory Visit: Payer: Self-pay

## 2020-10-29 ENCOUNTER — Encounter (HOSPITAL_COMMUNITY): Payer: Self-pay

## 2020-10-29 ENCOUNTER — Emergency Department (HOSPITAL_COMMUNITY): Payer: No Typology Code available for payment source

## 2020-10-29 ENCOUNTER — Observation Stay (HOSPITAL_BASED_OUTPATIENT_CLINIC_OR_DEPARTMENT_OTHER): Payer: No Typology Code available for payment source

## 2020-10-29 ENCOUNTER — Inpatient Hospital Stay (HOSPITAL_COMMUNITY)
Admission: EM | Admit: 2020-10-29 | Discharge: 2020-11-02 | DRG: 286 | Disposition: A | Discharge status: Home / Self Care | Payer: No Typology Code available for payment source | Attending: Internal Medicine | Admitting: Internal Medicine

## 2020-10-29 DIAGNOSIS — I110 Hypertensive heart disease with heart failure: Principal | ICD-10-CM | POA: Diagnosis present

## 2020-10-29 DIAGNOSIS — Z20822 Contact with and (suspected) exposure to covid-19: Secondary | ICD-10-CM | POA: Diagnosis present

## 2020-10-29 DIAGNOSIS — I472 Ventricular tachycardia: Secondary | ICD-10-CM | POA: Diagnosis present

## 2020-10-29 DIAGNOSIS — R079 Chest pain, unspecified: Secondary | ICD-10-CM

## 2020-10-29 DIAGNOSIS — F4310 Post-traumatic stress disorder, unspecified: Secondary | ICD-10-CM | POA: Diagnosis present

## 2020-10-29 DIAGNOSIS — N179 Acute kidney failure, unspecified: Secondary | ICD-10-CM | POA: Diagnosis present

## 2020-10-29 DIAGNOSIS — I209 Angina pectoris, unspecified: Secondary | ICD-10-CM | POA: Diagnosis present

## 2020-10-29 DIAGNOSIS — I208 Other forms of angina pectoris: Secondary | ICD-10-CM | POA: Diagnosis present

## 2020-10-29 DIAGNOSIS — J9611 Chronic respiratory failure with hypoxia: Secondary | ICD-10-CM | POA: Diagnosis present

## 2020-10-29 DIAGNOSIS — I428 Other cardiomyopathies: Secondary | ICD-10-CM | POA: Diagnosis present

## 2020-10-29 DIAGNOSIS — Z8249 Family history of ischemic heart disease and other diseases of the circulatory system: Secondary | ICD-10-CM

## 2020-10-29 DIAGNOSIS — I959 Hypotension, unspecified: Secondary | ICD-10-CM | POA: Diagnosis present

## 2020-10-29 DIAGNOSIS — Z79899 Other long term (current) drug therapy: Secondary | ICD-10-CM

## 2020-10-29 DIAGNOSIS — E785 Hyperlipidemia, unspecified: Secondary | ICD-10-CM | POA: Diagnosis present

## 2020-10-29 DIAGNOSIS — N400 Enlarged prostate without lower urinary tract symptoms: Secondary | ICD-10-CM | POA: Diagnosis present

## 2020-10-29 DIAGNOSIS — Z87891 Personal history of nicotine dependence: Secondary | ICD-10-CM

## 2020-10-29 DIAGNOSIS — Z7951 Long term (current) use of inhaled steroids: Secondary | ICD-10-CM

## 2020-10-29 DIAGNOSIS — I272 Pulmonary hypertension, unspecified: Secondary | ICD-10-CM | POA: Diagnosis present

## 2020-10-29 DIAGNOSIS — Z833 Family history of diabetes mellitus: Secondary | ICD-10-CM

## 2020-10-29 DIAGNOSIS — Z9981 Dependence on supplemental oxygen: Secondary | ICD-10-CM

## 2020-10-29 DIAGNOSIS — I5021 Acute systolic (congestive) heart failure: Secondary | ICD-10-CM | POA: Diagnosis present

## 2020-10-29 DIAGNOSIS — R0603 Acute respiratory distress: Secondary | ICD-10-CM

## 2020-10-29 DIAGNOSIS — J439 Emphysema, unspecified: Secondary | ICD-10-CM | POA: Diagnosis present

## 2020-10-29 LAB — CBC WITH DIFFERENTIAL/PLATELET
Abs Immature Granulocytes: 0.01 10*3/uL (ref 0.00–0.07)
Basophils Absolute: 0 10*3/uL (ref 0.0–0.1)
Basophils Relative: 1 %
Eosinophils Absolute: 0.1 10*3/uL (ref 0.0–0.5)
Eosinophils Relative: 3 %
HCT: 37 % — ABNORMAL LOW (ref 39.0–52.0)
Hemoglobin: 11.8 g/dL — ABNORMAL LOW (ref 13.0–17.0)
Immature Granulocytes: 0 %
Lymphocytes Relative: 25 %
Lymphs Abs: 1.3 10*3/uL (ref 0.7–4.0)
MCH: 29.4 pg (ref 26.0–34.0)
MCHC: 31.9 g/dL (ref 30.0–36.0)
MCV: 92.3 fL (ref 80.0–100.0)
Monocytes Absolute: 0.5 10*3/uL (ref 0.1–1.0)
Monocytes Relative: 10 %
Neutro Abs: 3.1 10*3/uL (ref 1.7–7.7)
Neutrophils Relative %: 61 %
Platelets: 243 10*3/uL (ref 150–400)
RBC: 4.01 MIL/uL — ABNORMAL LOW (ref 4.22–5.81)
RDW: 13.4 % (ref 11.5–15.5)
WBC: 5.1 10*3/uL (ref 4.0–10.5)
nRBC: 0 % (ref 0.0–0.2)

## 2020-10-29 LAB — COMPREHENSIVE METABOLIC PANEL
ALT: 16 U/L (ref 0–44)
AST: 20 U/L (ref 15–41)
Albumin: 3.7 g/dL (ref 3.5–5.0)
Alkaline Phosphatase: 74 U/L (ref 38–126)
Anion gap: 6 (ref 5–15)
BUN: 17 mg/dL (ref 8–23)
CO2: 32 mmol/L (ref 22–32)
Calcium: 9.2 mg/dL (ref 8.9–10.3)
Chloride: 103 mmol/L (ref 98–111)
Creatinine, Ser: 1.16 mg/dL (ref 0.61–1.24)
GFR, Estimated: >60 mL/min (ref >60)
Glucose, Bld: 111 mg/dL — ABNORMAL HIGH (ref 70–99)
Potassium: 4.6 mmol/L (ref 3.5–5.1)
Sodium: 141 mmol/L (ref 135–145)
Total Bilirubin: 0.6 mg/dL (ref 0.3–1.2)
Total Protein: 7.3 g/dL (ref 6.5–8.1)

## 2020-10-29 LAB — CBC
HCT: 38.5 % — ABNORMAL LOW (ref 39.0–52.0)
Hemoglobin: 12.5 g/dL — ABNORMAL LOW (ref 13.0–17.0)
MCH: 30 pg (ref 26.0–34.0)
MCHC: 32.5 g/dL (ref 30.0–36.0)
MCV: 92.3 fL (ref 80.0–100.0)
Platelets: 256 10*3/uL (ref 150–400)
RBC: 4.17 MIL/uL — ABNORMAL LOW (ref 4.22–5.81)
RDW: 13.3 % (ref 11.5–15.5)
WBC: 4.7 10*3/uL (ref 4.0–10.5)
nRBC: 0 % (ref 0.0–0.2)

## 2020-10-29 LAB — RAPID URINE DRUG SCREEN, HOSP PERFORMED
Amphetamines: NOT DETECTED
Barbiturates: NOT DETECTED
Benzodiazepines: NOT DETECTED
Cocaine: NOT DETECTED
Opiates: NOT DETECTED
Tetrahydrocannabinol: NOT DETECTED

## 2020-10-29 LAB — ECHOCARDIOGRAM COMPLETE
Area-P 1/2: 3.83 cm2
S' Lateral: 3.9 cm
Single Plane A4C EF: 42.6 %

## 2020-10-29 LAB — TROPONIN I (HIGH SENSITIVITY)
Troponin I (High Sensitivity): 10 ng/L (ref <18)
Troponin I (High Sensitivity): 9 ng/L (ref <18)
Troponin I (High Sensitivity): 9 ng/L (ref <18)
Troponin I (High Sensitivity): 9 ng/L (ref <18)

## 2020-10-29 LAB — LIPID PANEL
Cholesterol: 244 mg/dL — ABNORMAL HIGH (ref 0–200)
HDL: 79 mg/dL (ref >40)
LDL Cholesterol: 147 mg/dL — ABNORMAL HIGH (ref 0–99)
Total CHOL/HDL Ratio: 3.1 RATIO
Triglycerides: 92 mg/dL (ref <150)
VLDL: 18 mg/dL (ref 0–40)

## 2020-10-29 LAB — CREATININE, SERUM
Creatinine, Ser: 1.19 mg/dL (ref 0.61–1.24)
GFR, Estimated: >60 mL/min (ref >60)

## 2020-10-29 LAB — BRAIN NATRIURETIC PEPTIDE: B Natriuretic Peptide: 38.9 pg/mL (ref 0.0–100.0)

## 2020-10-29 LAB — HIV ANTIBODY (ROUTINE TESTING W REFLEX): HIV Screen 4th Generation wRfx: NONREACTIVE

## 2020-10-29 MED ORDER — ONDANSETRON HCL 4 MG/2ML IJ SOLN: 4.0000 mg | Freq: Four times a day (QID) | INTRAMUSCULAR | Status: DC | PRN

## 2020-10-29 MED ORDER — KETOROLAC TROMETHAMINE 10 MG PO TABS
10.0000 mg | ORAL_TABLET | Freq: Once | ORAL | Status: AC
  Administered 2020-10-29: 10 mg via ORAL
  Filled 2020-10-29: qty 1

## 2020-10-29 MED ORDER — ENOXAPARIN SODIUM 40 MG/0.4ML IJ SOSY
40.0000 mg | PREFILLED_SYRINGE | INTRAMUSCULAR | Status: DC
  Administered 2020-10-29 – 2020-10-31 (×3): 40 mg via SUBCUTANEOUS
  Filled 2020-10-29 (×3): qty 0.4

## 2020-10-29 MED ORDER — LISINOPRIL 20 MG PO TABS
40.0000 mg | ORAL_TABLET | Freq: Every day | ORAL | Status: DC
  Administered 2020-10-29 – 2020-10-30 (×2): 40 mg via ORAL
  Filled 2020-10-29 (×2): qty 2

## 2020-10-29 MED ORDER — TRAMADOL HCL 50 MG PO TABS
50.0000 mg | ORAL_TABLET | Freq: Four times a day (QID) | ORAL | Status: DC | PRN
  Administered 2020-10-29 – 2020-10-30 (×3): 50 mg via ORAL
  Filled 2020-10-29 (×3): qty 1

## 2020-10-29 MED ORDER — ASPIRIN 325 MG PO TABS
325.0000 mg | ORAL_TABLET | Freq: Once | ORAL | Status: AC
  Administered 2020-10-29: 325 mg via ORAL
  Filled 2020-10-29: qty 1

## 2020-10-29 MED ORDER — ACETAMINOPHEN 325 MG PO TABS
650.0000 mg | ORAL_TABLET | Freq: Four times a day (QID) | ORAL | Status: DC | PRN
  Administered 2020-10-29 – 2020-10-30 (×2): 650 mg via ORAL
  Filled 2020-10-29 (×2): qty 2

## 2020-10-29 NOTE — Progress Notes (Signed)
  Echocardiogram 2D Echocardiogram has been performed.  Stark Bray Swaim 10/29/2020, 2:02 PM

## 2020-10-29 NOTE — ED Notes (Signed)
Belva Chimes, RN has been sent a secure message in regards to admission bed assignment room 402

## 2020-10-29 NOTE — ED Notes (Signed)
Patient co being hungry- diet ordered.  Fluids offered

## 2020-10-29 NOTE — H&P (Signed)
History and Physical    Gerald Williams:[id number] DOB: 04-30-1950 DOA: 10/29/2020  [facility]P: [facility]  Patient coming from: Home  Chief Complaint: chest pain  HPI: Gerald Williams is a 70 y.o. male with medical history significant of HLD, HTN, emphysema. Presenting with left side chest pain. Symptoms started 2 days ago. Pain was left side at top of breast and radiating down the left arm. It was a constant, throbbing pain. There was no numbness or tingling in his arm. He didn't try any medications to make it any better. There was no syncopal episodes or diaphoresis. His pain intensified early this morning, so he decided to come to the ED for help. He denies any other aggravating or alleviating factors.   ED Course: Trp were negative x 2. EKG was ok. Patient's chest pain improved after ASA. TRH was called for admission.   Review of Systems:  Denies palpitations, lightheadedness, dizziness, abdominal pain, N/V/D, fever, diaphoresis. Review of systems is otherwise negative for all not mentioned in HPI.   PMHx Past Medical History:  Diagnosis Date   Emphysema    Erectile dysfunction    Hyperlipemia    Hypertension    Polysubstance abuse (HCC)    stopped using crack/cocaine December 4th 20-14   PTSD (post-traumatic stress disorder)    Sleep apnea     PSHx Past Surgical History:  Procedure Laterality Date   CARDIAC CATHETERIZATION     CYSTOSCOPY WITH INSERTION OF UROLIFT N/A 08/03/2020   Procedure: CYSTOSCOPY WITH INSERTION OF UROLIFT;  Surgeon: Marcine Matar, MD;  Location: [facility];  Service: Urology;  Laterality: N/A;  30 MINS   LEFT HEART CATHETERIZATION WITH CORONARY ANGIOGRAM N/A 07/15/2014   Procedure: LEFT HEART CATHETERIZATION WITH CORONARY ANGIOGRAM;  Surgeon: Lennette Bihari, MD;  Location: [facility] CATH LAB;  Service: Cardiovascular;  Laterality: N/A;    SocHx  reports that he quit smoking about 7 years ago. His smoking use included cigarettes. He started smoking  about 57 years ago. He has a 75.00 pack-year smoking history. He has never used smokeless tobacco. He reports that he does not drink alcohol and does not use drugs.  Allergies  Allergen Reactions   Budeson-Glycopyrrol-Formoterol     Other reaction(s): Retention of urine, Constipation, Retention of urine, Constipation   Gabapentin     Other reaction(s): Dizziness   Nortriptyline     Other reaction(s): Drowsy   Oxcarbazepine Nausea And Vomiting   Tiotropium     Other reaction(s): Cough, Dysphagia, Cough, Dysphagia   Tiotropium Bromide-Olodaterol Itching   Olodaterol Hives and Rash    FamHx Family History  Problem Relation Age of Onset   Diabetes Mother        Deceased   Heart disease Father        Deceased   Heart attack Father        x5   Cancer - Other Sister    Healthy Son    Healthy Daughter    Heart attack Brother        Deceased   Alcohol abuse Brother        Deceased    Prior to Admission medications   Medication Sig Start Date End Date Taking? Authorizing Provider  albuterol (PROVENTIL) (2.5 MG/3ML) 0.083% nebulizer solution Take 2.5 mg by nebulization 2 (two) times daily.    [provider]  albuterol (VENTOLIN HFA) 108 (90 Base) MCG/ACT inhaler Inhale 2 puffs into the lungs every 6 (six) hours as needed for shortness of  breath or wheezing. 09/24/20   Alfonse Spruce, MD  atorvastatin (LIPITOR) 80 MG tablet Take by mouth. As needed 05/12/20   [provider]  azelastine (ASTELIN) 0.1 % nasal spray Place 1 spray into both nostrils 2 (two) times daily. Use in each nostril as directed 09/24/20 10/24/20  Alfonse Spruce, MD  bacitracin ointment Apply 1 application topically 2 (two) times daily. 08/17/20   Valinda Hoar, NP  budesonide-formoterol (SYMBICORT) 160-4.5 MCG/ACT inhaler Inhale 2 puffs into the lungs in the morning and at bedtime. 04/07/20   [provider]  carboxymethylcellulose (REFRESH PLUS) 0.5 % SOLN Apply to eye. 11/28/19    [provider]  ciprofloxacin (CIPRO) 250 MG tablet Take 1 tablet (250 mg total) by mouth 2 (two) times daily. 08/03/20   Marcine Matar, MD  fluticasone (FLONASE) 50 MCG/ACT nasal spray Place into the nose. 03/06/20   [provider]  glucose blood (COOL BLOOD GLUCOSE TEST STRIPS) test strip Used to test blood sugar x2 daily---diagnosis code r73.03--for one touch verio flex 12/08/15   Burns, [name], MD  guaiFENesin (MUCINEX) 600 MG 12 hr tablet Take by mouth. 02/07/20   [provider]  ipratropium (ATROVENT) 0.03 % nasal spray Place 1 spray into both nostrils every 8 (eight) hours as needed for rhinitis. 10/08/20   Alfonse Spruce, MD  Ipratropium-Albuterol (COMBIVENT) 20-100 MCG/ACT AERS respimat Inhale into the lungs. 07/16/20   [provider]  Lancets MISC Used to test blood sugar x2 daily---diagnosis code r73.03--for one touch verio flex 12/08/15   Burns, [name], MD  lidocaine (XYLOCAINE) 5 % ointment Apply 1 application topically as needed. To left lateral chest wall 05/07/19   Alessandra Bevels, MD  lisinopril (ZESTRIL) 40 MG tablet Take 1 tablet by mouth daily. 05/12/20   [provider]  mirtazapine (REMERON) 30 MG tablet Take 30 mg by mouth at bedtime.    [provider]  NON FORMULARY at bedtime. CPAP - followed at [facility]    [provider]  OXYGEN Inhale 4-5 L into the lungs continuous.    [provider]  predniSONE (DELTASONE) 10 MG tablet Take 1 tablet twice a day for 4 days then 1 tablet on day 5 then stop 10/08/20   Alfonse Spruce, MD  sildenafil (VIAGRA) 100 MG tablet Take by mouth. 05/12/20   [provider]    Physical Exam: Vitals:   10/29/20 0515 10/29/20 0530 10/29/20 0600 10/29/20 0630  BP: (!) 151/114 (!) 144/95 (!) 161/102 (!) 137/94  Pulse: 75 77 70 71  Resp: (!) 25 (!) 24 18 17   Temp:      TempSrc:      SpO2: 100% 100% 99% 100%    General: 70 y.o. male resting in bed in  NAD Eyes: PERRL, normal sclera ENMT: Nares patent w/o discharge, orophaynx clear, dentition normal, ears w/o discharge/lesions/ulcers Neck: Supple, trachea midline Cardiovascular: RRR, +S1, S2, no m/g/r, equal pulses throughout; reproducible chest pain on palpation Respiratory: CTABL, no w/r/r, normal WOB GI: BS+, NDNT, no masses noted, no organomegaly noted MSK: No e/c/c Skin: No rashes, bruises, ulcerations noted Neuro: A&O x 3, no focal deficits Psyc: Appropriate interaction and affect, calm/cooperative  Labs on Admission: I have personally reviewed following labs and imaging studies  CBC: Recent Labs  Lab 10/29/20 0506  WBC 5.1  NEUTROABS 3.1  HGB 11.8*  HCT 37.0*  MCV 92.3  PLT 243   Basic Metabolic Panel: Recent Labs  Lab 10/29/20 0506  NA 141  K 4.6  CL 103  CO2 32  GLUCOSE 111*  BUN 17  CREATININE 1.16  CALCIUM 9.2   GFR: CrCl cannot be calculated (Unknown ideal weight.). Liver Function Tests: Recent Labs  Lab 10/29/20 0506  AST 20  ALT 16  ALKPHOS 74  BILITOT 0.6  PROT 7.3  ALBUMIN 3.7   No results for input(s): LIPASE, AMYLASE in the last 168 hours. No results for input(s): AMMONIA in the last 168 hours. Coagulation Profile: No results for input(s): INR, PROTIME in the last 168 hours. Cardiac Enzymes: No results for input(s): CKTOTAL, CKMB, CKMBINDEX, TROPONINI in the last 168 hours. BNP (last 3 results) No results for input(s): PROBNP in the last 8760 hours. HbA1C: No results for input(s): HGBA1C in the last 72 hours. CBG: No results for input(s): GLUCAP in the last 168 hours. Lipid Profile: No results for input(s): CHOL, HDL, LDLCALC, TRIG, CHOLHDL, LDLDIRECT in the last 72 hours. Thyroid Function Tests: No results for input(s): TSH, T4TOTAL, FREET4, T3FREE, THYROIDAB in the last 72 hours. Anemia Panel: No results for input(s): VITAMINB12, FOLATE, FERRITIN, TIBC, IRON, RETICCTPCT in the last 72 hours. Urine analysis:    Component  Value Date/Time   COLORURINE YELLOW 05/19/2020 1008   APPEARANCEUR CLEAR 05/19/2020 1008   LABSPEC 1.030 05/19/2020 1008   PHURINE 6.0 05/19/2020 1008   GLUCOSEU NEGATIVE 05/19/2020 1008   HGBUR NEGATIVE 05/19/2020 1008   BILIRUBINUR NEGATIVE 05/19/2020 1008   KETONESUR NEGATIVE 05/19/2020 1008   PROTEINUR NEGATIVE 05/19/2020 1008   UROBILINOGEN 0.2 01/06/2013 1204   NITRITE NEGATIVE 05/19/2020 1008   LEUKOCYTESUR NEGATIVE 05/19/2020 1008    Radiological Exams on Admission: DG Chest 2 View  Result Date: 10/29/2020 CLINICAL DATA:  Chest pain EXAM: CHEST - 2 VIEW COMPARISON:  05/06/2019 FINDINGS: Mild linear scarring bilaterally, stable. There are large lung volumes and emphysema by 2019 CT. There is no edema, consolidation, effusion, or pneumothorax. Normal heart size and mediastinal contours. IMPRESSION: No acute finding. COPD. Electronically Signed   By: Marnee Spring M.D.   On: 10/29/2020 05:19    EKG: Independently reviewed. Sinus, no st elevations  Assessment/Plan Chest pain     - place in obs, tele     - check serial trp, echo, BNP, lipid panel, UDS     - EKG w/o st elevation     - chest pain is reproducible on exam     - used to follow w/ Dr. Rennis Golden but [facility] the [place]; [facility] Rennis Golden and he declines care with [facility]     - needs cardiology follow up  Emphysema on chronic O2 4L at rest     - reports having to recently need an increase in O2; says he requires at least 5 - 6 L when walking (again recent change)     - finish heart w/u with an echo; add 6 MWT in AM     - continue home inhalers when confirmed     - continue pulm follow up     - currently on 3.5L and sats are good  HTN     - resume home regimen; will have PRNs available  HLD     - continue statin when confirmed  DVT prophylaxis: lovenox  Code Status: FULL  Family Communication: None at bedside  Consults called: None   Status is:  Observation  The patient remains OBS appropriate and will d/c before  2 midnights.  Dispo: The patient is from: Home              Anticipated d/c is to: Home              Patient currently is not medically stable to d/c.   Difficult to place patient No  Time spent coordinating admission: 45 minutes  Kamya Watling A Stasia Somero DO [facility]  If 7PM-7AM, please contact night-coverage [email or web address]  10/29/2020, 7:23 AM

## 2020-10-29 NOTE — ED Notes (Signed)
Pt c/o headache. Wanted something for pain. Messaged admitting doctor for med orders as there are no orders yet. Pt aware he's being admitted.

## 2020-10-29 NOTE — ED Provider Notes (Signed)
[facility]-EMERGENCY DEPT Provider Note   CSN: [id number] Arrival date & time: 10/29/20  4098     History No chief complaint on file.   Gerald Williams is a 70 y.o. male hx of HTN, COPD on 4 L at baseline, PTSD here presenting with chest pain.  Patient states that he was watching TV around 1 AM had left-sided chest pain radiated down the left arm.  He states that he has not subsided and he is still having pain.  He states that his shortness of breath is baseline.  He denies any pain radiating to his back or abdomen.  He denies any history of CAD in the past.  He states that his father died of MI and had 5 heart attacks.  He states that he had angiogram done several years ago and does not have a stent in his heart  The history is provided by the patient.      Past Medical History:  Diagnosis Date   Emphysema    Erectile dysfunction    Hyperlipemia    Hypertension    Polysubstance abuse (HCC)    stopped using crack/cocaine December 4th 20-14   PTSD (post-traumatic stress disorder)    Sleep apnea     Patient Active Problem List   Diagnosis Date Noted   Chronic respiratory failure with hypoxia (HCC) 09/18/2018   Exercise hypoxemia 11/07/2017   Frequent headaches 05/23/2017   Acute pain of right knee 12/23/2016   Old tear of lateral meniscus of right knee 12/23/2016   Blood in stool 10/05/2015   HTN (hypertension) 06/18/2015   Cephalalgia 06/18/2015   Dyslipidemia 06/12/2015   COPD GOLD III 05/28/2015   Peripheral neuropathy 05/20/2015   COPD (chronic obstructive pulmonary disease) with emphysema (HCC) 05/20/2015   PTSD (post-traumatic stress disorder) 05/20/2015   Right carotid bruit 05/20/2015   Frequent urination 05/20/2015   Hematuria 07/15/2014   History of tobacco abuse:  quit in ~ 06/2013  07/14/2014   OSA on CPAP 07/14/2014   Pre-diabetes 07/14/2014   Claudication, R>L 07/14/2014   Cocaine use, history of 03/26/2011   Chest pain 03/26/2011     Past Surgical History:  Procedure Laterality Date   CARDIAC CATHETERIZATION     CYSTOSCOPY WITH INSERTION OF UROLIFT N/A 08/03/2020   Procedure: CYSTOSCOPY WITH INSERTION OF UROLIFT;  Surgeon: Marcine Matar, MD;  Location: [facility];  Service: Urology;  Laterality: N/A;  30 MINS   LEFT HEART CATHETERIZATION WITH CORONARY ANGIOGRAM N/A 07/15/2014   Procedure: LEFT HEART CATHETERIZATION WITH CORONARY ANGIOGRAM;  Surgeon: Lennette Bihari, MD;  Location: [facility] CATH LAB;  Service: Cardiovascular;  Laterality: N/A;       Family History  Problem Relation Age of Onset   Diabetes Mother        Deceased   Heart disease Father        Deceased   Heart attack Father        x5   Cancer - Other Sister    Healthy Son    Healthy Daughter    Heart attack Brother        Deceased   Alcohol abuse Brother        Deceased    Social History   Tobacco Use   Smoking status: Former    Packs/day: 1.50    Years: 50.00    Pack years: 75.00    Types: Cigarettes    Start date: 04/26/1963    Quit date: 09/22/2013    Years  since quitting: 7.1   Smokeless tobacco: Never   Tobacco comments:    patient using the patch, losenges, and medication  Vaping Use   Vaping Use: Never used  Substance Use Topics   Alcohol use: No    Alcohol/week: 0.0 standard drinks    Comment: He was drinking 3-4 drinks nightly x 40 years, quit in 2014.     Drug use: No    Comment: history of crack use and drug tested weekly at the [facility] Medications Prior to Admission medications   Medication Sig Start Date End Date Taking? Authorizing Provider  albuterol (PROVENTIL) (2.5 MG/3ML) 0.083% nebulizer solution Take 2.5 mg by nebulization 2 (two) times daily.    [provider]  albuterol (VENTOLIN HFA) 108 (90 Base) MCG/ACT inhaler Inhale 2 puffs into the lungs every 6 (six) hours as needed for shortness of breath or wheezing. 09/24/20   Alfonse Spruce, MD  atorvastatin (LIPITOR) 80 MG tablet Take by mouth. As  needed 05/12/20   [provider]  azelastine (ASTELIN) 0.1 % nasal spray Place 1 spray into both nostrils 2 (two) times daily. Use in each nostril as directed 09/24/20 10/24/20  Alfonse Spruce, MD  bacitracin ointment Apply 1 application topically 2 (two) times daily. 08/17/20   Valinda Hoar, NP  budesonide-formoterol (SYMBICORT) 160-4.5 MCG/ACT inhaler Inhale 2 puffs into the lungs in the morning and at bedtime. 04/07/20   [provider]  carboxymethylcellulose (REFRESH PLUS) 0.5 % SOLN Apply to eye. 11/28/19   [provider]  ciprofloxacin (CIPRO) 250 MG tablet Take 1 tablet (250 mg total) by mouth 2 (two) times daily. 08/03/20   Marcine Matar, MD  fluticasone (FLONASE) 50 MCG/ACT nasal spray Place into the nose. 03/06/20   [provider]  glucose blood (COOL BLOOD GLUCOSE TEST STRIPS) test strip Used to test blood sugar x2 daily---diagnosis code r73.03--for one touch verio flex 12/08/15   Burns, [name], MD  guaiFENesin (MUCINEX) 600 MG 12 hr tablet Take by mouth. 02/07/20   [provider]  ipratropium (ATROVENT) 0.03 % nasal spray Place 1 spray into both nostrils every 8 (eight) hours as needed for rhinitis. 10/08/20   Alfonse Spruce, MD  Ipratropium-Albuterol (COMBIVENT) 20-100 MCG/ACT AERS respimat Inhale into the lungs. 07/16/20   [provider]  Lancets MISC Used to test blood sugar x2 daily---diagnosis code r73.03--for one touch verio flex 12/08/15   Burns, [name], MD  lidocaine (XYLOCAINE) 5 % ointment Apply 1 application topically as needed. To left lateral chest wall 05/07/19   Alessandra Bevels, MD  lisinopril (ZESTRIL) 40 MG tablet Take 1 tablet by mouth daily. 05/12/20   [provider]  mirtazapine (REMERON) 30 MG tablet Take 30 mg by mouth at bedtime.    [provider]  NON FORMULARY at bedtime. CPAP - followed at [facility]    [provider]  OXYGEN Inhale 4-5 L into the lungs continuous.     [provider]  predniSONE (DELTASONE) 10 MG tablet Take 1 tablet twice a day for 4 days then 1 tablet on day 5 then stop 10/08/20   Alfonse Spruce, MD  sildenafil (VIAGRA) 100 MG tablet Take by mouth. 05/12/20   [provider]    Allergies    Budeson-glycopyrrol-formoterol, Gabapentin, Nortriptyline, Oxcarbazepine, Tiotropium, Tiotropium bromide-olodaterol, and Olodaterol  Review of Systems   Review of Systems  Cardiovascular:  Positive for chest pain.  All other systems reviewed and are  negative.  Physical Exam Updated Vital Signs BP (!) 157/108 (BP Location: Left Arm)   Pulse 92   Temp 98.3 F (36.8 C) (Oral)   Resp (!) 24   SpO2 100%   Physical Exam Vitals and nursing note reviewed.  Constitutional:      Comments: Slightly uncomfortable   HENT:     Head: Normocephalic.     Nose: Nose normal.     Mouth/Throat:     Mouth: Mucous membranes are moist.  Eyes:     Extraocular Movements: Extraocular movements intact.     Pupils: Pupils are equal, round, and reactive to light.  Cardiovascular:     Rate and Rhythm: Normal rate and regular rhythm.     Pulses: Normal pulses.     Heart sounds: Normal heart sounds.  Pulmonary:     Effort: Pulmonary effort is normal.     Breath sounds: Normal breath sounds.  Abdominal:     General: Abdomen is flat.     Palpations: Abdomen is soft.  Musculoskeletal:        General: Normal range of motion.     Cervical back: Normal range of motion and neck supple.  Skin:    General: Skin is warm.     Capillary Refill: Capillary refill takes less than 2 seconds.  Neurological:     General: No focal deficit present.     Mental Status: He is alert and oriented to person, place, and time.  Psychiatric:        Mood and Affect: Mood normal.        Behavior: Behavior normal.    ED Results / Procedures / Treatments   Labs (all labs ordered are listed, but only abnormal results are displayed) Labs Reviewed  CBC WITH  DIFFERENTIAL/PLATELET  COMPREHENSIVE METABOLIC PANEL  TROPONIN I (HIGH SENSITIVITY)    EKG EKG Interpretation  Date/Time:  Thursday October 29 2020 04:40:41 EDT Ventricular Rate:  91 PR Interval:  128 QRS Duration: 80 QT Interval:  356 QTC Calculation: 438 R Axis:   57 Text Interpretation: Sinus rhythm Ventricular premature complex No significant change since last tracing Confirmed by Richardean Canal [phone]) on 10/29/2020 4:46:23 AM  Radiology No results found.  Procedures Procedures   Medications Ordered in ED Medications  aspirin tablet 325 mg (has no administration in time range)    ED Course  I have reviewed the triage vital signs and the nursing notes.  Pertinent labs & imaging results that were available during my care of the patient were reviewed by me and considered in my medical decision making (see chart for details).    MDM Rules/Calculators/A&P                          Mitsugi Schrader is a 70 y.o. male here with chest pain.  Chest pain radiated down the left arm.  Patient does have risk factors for CAD.  Plan to get CBC and CMP and troponin.  I doubt dissection or PE.   6:18 AM Patient's pain resolved with aspirin.  Patient's initial troponin is 10.  Patient's heart score is 5.  At this point will admit for rule out ACS   Final Clinical Impression(s) / ED Diagnoses Final diagnoses:  None    Rx / DC Orders ED Discharge Orders     None        Charlynne Pander, MD 10/29/20 [phone]

## 2020-10-29 NOTE — ED Triage Notes (Addendum)
Pt arrived via POV, c/o left sided chest pain radiating down left arm, continuous, non reproducible, started since 1am this morning while he was watching TV.   On 4L [place] baseline

## 2020-10-29 NOTE — ED Notes (Signed)
Patient to room 402

## 2020-10-30 ENCOUNTER — Encounter (HOSPITAL_COMMUNITY)
Admission: EM | Disposition: A | Discharge status: Home / Self Care | Payer: Self-pay | Source: Home / Self Care | Attending: Internal Medicine

## 2020-10-30 ENCOUNTER — Inpatient Hospital Stay (HOSPITAL_COMMUNITY): Payer: No Typology Code available for payment source

## 2020-10-30 DIAGNOSIS — N400 Enlarged prostate without lower urinary tract symptoms: Secondary | ICD-10-CM | POA: Diagnosis present

## 2020-10-30 DIAGNOSIS — Z7951 Long term (current) use of inhaled steroids: Secondary | ICD-10-CM | POA: Diagnosis not present

## 2020-10-30 DIAGNOSIS — I472 Ventricular tachycardia: Secondary | ICD-10-CM | POA: Diagnosis present

## 2020-10-30 DIAGNOSIS — R609 Edema, unspecified: Secondary | ICD-10-CM | POA: Diagnosis not present

## 2020-10-30 DIAGNOSIS — I208 Other forms of angina pectoris: Secondary | ICD-10-CM

## 2020-10-30 DIAGNOSIS — Z833 Family history of diabetes mellitus: Secondary | ICD-10-CM | POA: Diagnosis not present

## 2020-10-30 DIAGNOSIS — I272 Pulmonary hypertension, unspecified: Secondary | ICD-10-CM | POA: Diagnosis present

## 2020-10-30 DIAGNOSIS — J449 Chronic obstructive pulmonary disease, unspecified: Secondary | ICD-10-CM | POA: Diagnosis not present

## 2020-10-30 DIAGNOSIS — I959 Hypotension, unspecified: Secondary | ICD-10-CM | POA: Diagnosis present

## 2020-10-30 DIAGNOSIS — I5042 Chronic combined systolic (congestive) and diastolic (congestive) heart failure: Secondary | ICD-10-CM

## 2020-10-30 DIAGNOSIS — N179 Acute kidney failure, unspecified: Secondary | ICD-10-CM | POA: Diagnosis present

## 2020-10-30 DIAGNOSIS — I5021 Acute systolic (congestive) heart failure: Secondary | ICD-10-CM

## 2020-10-30 DIAGNOSIS — Z9981 Dependence on supplemental oxygen: Secondary | ICD-10-CM | POA: Diagnosis not present

## 2020-10-30 DIAGNOSIS — Z87891 Personal history of nicotine dependence: Secondary | ICD-10-CM | POA: Diagnosis not present

## 2020-10-30 DIAGNOSIS — R079 Chest pain, unspecified: Secondary | ICD-10-CM | POA: Diagnosis present

## 2020-10-30 DIAGNOSIS — J9611 Chronic respiratory failure with hypoxia: Secondary | ICD-10-CM | POA: Diagnosis present

## 2020-10-30 DIAGNOSIS — I209 Angina pectoris, unspecified: Secondary | ICD-10-CM | POA: Diagnosis present

## 2020-10-30 DIAGNOSIS — Z20822 Contact with and (suspected) exposure to covid-19: Secondary | ICD-10-CM | POA: Diagnosis present

## 2020-10-30 DIAGNOSIS — Z79899 Other long term (current) drug therapy: Secondary | ICD-10-CM | POA: Diagnosis not present

## 2020-10-30 DIAGNOSIS — J439 Emphysema, unspecified: Secondary | ICD-10-CM | POA: Diagnosis present

## 2020-10-30 DIAGNOSIS — I10 Essential (primary) hypertension: Secondary | ICD-10-CM

## 2020-10-30 DIAGNOSIS — I110 Hypertensive heart disease with heart failure: Secondary | ICD-10-CM | POA: Diagnosis present

## 2020-10-30 DIAGNOSIS — E785 Hyperlipidemia, unspecified: Secondary | ICD-10-CM | POA: Diagnosis present

## 2020-10-30 DIAGNOSIS — F4310 Post-traumatic stress disorder, unspecified: Secondary | ICD-10-CM | POA: Diagnosis present

## 2020-10-30 DIAGNOSIS — I428 Other cardiomyopathies: Secondary | ICD-10-CM | POA: Diagnosis present

## 2020-10-30 DIAGNOSIS — Z8249 Family history of ischemic heart disease and other diseases of the circulatory system: Secondary | ICD-10-CM | POA: Diagnosis not present

## 2020-10-30 HISTORY — PX: RIGHT/LEFT HEART CATH AND CORONARY ANGIOGRAPHY: CATH118266

## 2020-10-30 LAB — POCT I-STAT EG7
Acid-Base Excess: 3 mmol/L — ABNORMAL HIGH (ref 0.0–2.0)
Acid-Base Excess: 3 mmol/L — ABNORMAL HIGH (ref 0.0–2.0)
Bicarbonate: 30.1 mmol/L — ABNORMAL HIGH (ref 20.0–28.0)
Bicarbonate: 30.5 mmol/L — ABNORMAL HIGH (ref 20.0–28.0)
Calcium, Ion: 1.27 mmol/L (ref 1.15–1.40)
Calcium, Ion: 1.28 mmol/L (ref 1.15–1.40)
HCT: 35 % — ABNORMAL LOW (ref 39.0–52.0)
HCT: 36 % — ABNORMAL LOW (ref 39.0–52.0)
Hemoglobin: 11.9 g/dL — ABNORMAL LOW (ref 13.0–17.0)
Hemoglobin: 12.2 g/dL — ABNORMAL LOW (ref 13.0–17.0)
O2 Saturation: 64 %
O2 Saturation: 98 %
Potassium: 4.1 mmol/L (ref 3.5–5.1)
Potassium: 4.1 mmol/L (ref 3.5–5.1)
Sodium: 138 mmol/L (ref 135–145)
Sodium: 138 mmol/L (ref 135–145)
TCO2: 32 mmol/L (ref 22–32)
TCO2: 32 mmol/L (ref 22–32)
pCO2, Ven: 55.4 mmHg (ref 44.0–60.0)
pCO2, Ven: 60.3 mmHg — ABNORMAL HIGH (ref 44.0–60.0)
pH, Ven: 7.312 (ref 7.250–7.430)
pH, Ven: 7.343 (ref 7.250–7.430)
pO2, Ven: 111 mmHg — ABNORMAL HIGH (ref 32.0–45.0)
pO2, Ven: 37 mmHg (ref 32.0–45.0)

## 2020-10-30 LAB — SARS CORONAVIRUS 2 (TAT 6-24 HRS): SARS Coronavirus 2: NEGATIVE

## 2020-10-30 LAB — GLUCOSE, CAPILLARY: Glucose-Capillary: 145 mg/dL — ABNORMAL HIGH (ref 70–99)

## 2020-10-30 SURGERY — RIGHT/LEFT HEART CATH AND CORONARY ANGIOGRAPHY
Anesthesia: LOCAL

## 2020-10-30 MED ORDER — VERAPAMIL HCL 2.5 MG/ML IV SOLN
INTRAVENOUS | Status: AC
  Filled 2020-10-30: qty 2

## 2020-10-30 MED ORDER — ASPIRIN EC 81 MG PO TBEC
81.0000 mg | DELAYED_RELEASE_TABLET | Freq: Every day | ORAL | Status: DC
  Administered 2020-10-30 – 2020-11-02 (×4): 81 mg via ORAL
  Filled 2020-10-30 (×4): qty 1

## 2020-10-30 MED ORDER — ALBUTEROL SULFATE (2.5 MG/3ML) 0.083% IN NEBU
2.5000 mg | INHALATION_SOLUTION | Freq: Four times a day (QID) | RESPIRATORY_TRACT | Status: DC | PRN
  Administered 2020-10-30: 2.5 mg via RESPIRATORY_TRACT
  Filled 2020-10-30: qty 3

## 2020-10-30 MED ORDER — MIDAZOLAM HCL 2 MG/2ML IJ SOLN
INTRAMUSCULAR | Status: DC | PRN
  Administered 2020-10-30: 1 mg via INTRAVENOUS

## 2020-10-30 MED ORDER — SODIUM CHLORIDE 0.9% FLUSH
3.0000 mL | Freq: Two times a day (BID) | INTRAVENOUS | Status: DC
  Administered 2020-10-30 – 2020-11-02 (×6): 3 mL via INTRAVENOUS

## 2020-10-30 MED ORDER — ASPIRIN 81 MG PO CHEW: 81.0000 mg | CHEWABLE_TABLET | ORAL | Status: DC

## 2020-10-30 MED ORDER — ATORVASTATIN CALCIUM 80 MG PO TABS
80.0000 mg | ORAL_TABLET | Freq: Every day | ORAL | Status: DC
  Administered 2020-10-30 – 2020-11-02 (×4): 80 mg via ORAL
  Filled 2020-10-30 (×4): qty 1
  Filled 2020-10-30: qty 2

## 2020-10-30 MED ORDER — SODIUM CHLORIDE 0.9 % IV SOLN
INTRAVENOUS | Status: AC | PRN
  Administered 2020-10-30: 10 mL/h via INTRAVENOUS

## 2020-10-30 MED ORDER — METOPROLOL SUCCINATE ER 25 MG PO TB24
25.0000 mg | ORAL_TABLET | Freq: Every day | ORAL | Status: DC
  Administered 2020-10-30: 25 mg via ORAL
  Filled 2020-10-30 (×2): qty 1

## 2020-10-30 MED ORDER — SODIUM CHLORIDE 0.9 % IV SOLN: 250.0000 mL | INTRAVENOUS | Status: DC | PRN

## 2020-10-30 MED ORDER — PRAZOSIN HCL 2 MG PO CAPS
5.0000 mg | ORAL_CAPSULE | Freq: Every day | ORAL | Status: DC
  Administered 2020-10-30: 5 mg via ORAL
  Filled 2020-10-30: qty 1

## 2020-10-30 MED ORDER — SODIUM CHLORIDE 0.9% FLUSH: 3.0000 mL | INTRAVENOUS | Status: DC | PRN

## 2020-10-30 MED ORDER — HEPARIN SODIUM (PORCINE) 1000 UNIT/ML IJ SOLN
INTRAMUSCULAR | Status: AC
  Filled 2020-10-30: qty 1

## 2020-10-30 MED ORDER — SODIUM CHLORIDE 0.9 % WEIGHT BASED INFUSION: 1.0000 mL/kg/h | INTRAVENOUS | Status: DC

## 2020-10-30 MED ORDER — FUROSEMIDE 10 MG/ML IJ SOLN
INTRAMUSCULAR | Status: AC
  Filled 2020-10-30: qty 4

## 2020-10-30 MED ORDER — MIDAZOLAM HCL 2 MG/2ML IJ SOLN
INTRAMUSCULAR | Status: AC
  Filled 2020-10-30: qty 2

## 2020-10-30 MED ORDER — PRAZOSIN HCL 2 MG PO CAPS
2.0000 mg | ORAL_CAPSULE | Freq: Every day | ORAL | Status: DC
  Administered 2020-10-30: 2 mg via ORAL
  Filled 2020-10-30: qty 1

## 2020-10-30 MED ORDER — FUROSEMIDE 10 MG/ML IJ SOLN
20.0000 mg | Freq: Two times a day (BID) | INTRAMUSCULAR | Status: DC
  Administered 2020-10-30: 20 mg via INTRAVENOUS

## 2020-10-30 MED ORDER — IOHEXOL 350 MG/ML SOLN
INTRAVENOUS | Status: DC | PRN
  Administered 2020-10-30: 55 mL

## 2020-10-30 MED ORDER — SODIUM CHLORIDE 0.9% FLUSH
3.0000 mL | INTRAVENOUS | Status: DC | PRN
  Administered 2020-10-30: 3 mL via INTRAVENOUS

## 2020-10-30 MED ORDER — VERAPAMIL HCL 2.5 MG/ML IV SOLN
INTRAVENOUS | Status: DC | PRN
  Administered 2020-10-30: 10 mL via INTRA_ARTERIAL

## 2020-10-30 MED ORDER — SODIUM CHLORIDE 0.9% FLUSH
3.0000 mL | Freq: Two times a day (BID) | INTRAVENOUS | Status: DC
  Administered 2020-10-30 – 2020-11-02 (×4): 3 mL via INTRAVENOUS

## 2020-10-30 MED ORDER — SODIUM CHLORIDE 0.9 % IV BOLUS: 500.0000 mL | Freq: Once | INTRAVENOUS | Status: DC | PRN

## 2020-10-30 MED ORDER — HEPARIN (PORCINE) IN NACL 2000-0.9 UNIT/L-% IV SOLN
INTRAVENOUS | Status: AC
  Filled 2020-10-30: qty 1000

## 2020-10-30 MED ORDER — LIDOCAINE HCL (PF) 1 % IJ SOLN
INTRAMUSCULAR | Status: DC | PRN
  Administered 2020-10-30 (×2): 2 mL

## 2020-10-30 MED ORDER — SODIUM CHLORIDE 0.9 % WEIGHT BASED INFUSION: 3.0000 mL/kg/h | INTRAVENOUS | Status: DC

## 2020-10-30 MED ORDER — IPRATROPIUM-ALBUTEROL 0.5-2.5 (3) MG/3ML IN SOLN
3.0000 mL | Freq: Two times a day (BID) | RESPIRATORY_TRACT | Status: DC
  Administered 2020-10-30 – 2020-11-02 (×6): 3 mL via RESPIRATORY_TRACT
  Filled 2020-10-30 (×7): qty 3

## 2020-10-30 MED ORDER — MOMETASONE FURO-FORMOTEROL FUM 200-5 MCG/ACT IN AERO
2.0000 | INHALATION_SPRAY | Freq: Two times a day (BID) | RESPIRATORY_TRACT | Status: DC
  Administered 2020-10-31 – 2020-11-02 (×4): 2 via RESPIRATORY_TRACT
  Filled 2020-10-30 (×2): qty 8.8

## 2020-10-30 MED ORDER — LIDOCAINE HCL (PF) 1 % IJ SOLN
INTRAMUSCULAR | Status: AC
  Filled 2020-10-30: qty 30

## 2020-10-30 MED ORDER — FENTANYL CITRATE (PF) 100 MCG/2ML IJ SOLN
INTRAMUSCULAR | Status: DC | PRN
  Administered 2020-10-30: 25 ug via INTRAVENOUS

## 2020-10-30 MED ORDER — MIRTAZAPINE 7.5 MG PO TABS
30.0000 mg | ORAL_TABLET | Freq: Every day | ORAL | Status: DC
  Administered 2020-10-30 – 2020-11-01 (×3): 30 mg via ORAL
  Filled 2020-10-30 (×3): qty 4

## 2020-10-30 MED ORDER — IPRATROPIUM BROMIDE 0.03 % NA SOLN
1.0000 | Freq: Three times a day (TID) | NASAL | Status: DC | PRN
  Filled 2020-10-30: qty 30

## 2020-10-30 MED ORDER — HEPARIN SODIUM (PORCINE) 1000 UNIT/ML IJ SOLN
INTRAMUSCULAR | Status: DC | PRN
  Administered 2020-10-30: 4000 [IU] via INTRAVENOUS

## 2020-10-30 MED ORDER — HEPARIN (PORCINE) IN NACL 1000-0.9 UT/500ML-% IV SOLN
INTRAVENOUS | Status: DC | PRN
  Administered 2020-10-30 (×2): 500 mL

## 2020-10-30 SURGICAL SUPPLY — 10 items
CATH BALLN WEDGE 5F 110CM (CATHETERS) ×2 IMPLANT
CATH INFINITI 5FR JK (CATHETERS) ×2 IMPLANT
DEVICE RAD COMP TR BAND LRG (VASCULAR PRODUCTS) ×2 IMPLANT
GLIDESHEATH SLEND SS 6F .021 (SHEATH) ×2 IMPLANT
KIT HEART LEFT (KITS) ×2 IMPLANT
PACK CARDIAC CATHETERIZATION (CUSTOM PROCEDURE TRAY) ×2 IMPLANT
SHEATH GLIDE SLENDER 4/5FR (SHEATH) ×2 IMPLANT
TRANSDUCER W/STOPCOCK (MISCELLANEOUS) ×2 IMPLANT
TUBING CIL FLEX 10 FLL-RA (TUBING) ×2 IMPLANT
WIRE HI TORQ VERSACORE J 260CM (WIRE) ×2 IMPLANT

## 2020-10-30 NOTE — Progress Notes (Signed)
[facility] via [facility].

## 2020-10-30 NOTE — Interval H&P Note (Signed)
History and Physical Interval Note:  10/30/2020 3:12 PM  Gerald Williams  has presented today for surgery, with the diagnosis of CHF.  The various methods of treatment have been discussed with the patient and family. After consideration of risks, benefits and other options for treatment, the patient has consented to  Procedure(s): RIGHT/LEFT HEART CATH AND CORONARY ANGIOGRAPHY (N/A) as a surgical intervention.  The patient's history has been reviewed, patient examined, no change in status, stable for surgery.  I have reviewed the patient's chart and labs.  Questions were answered to the patient's satisfaction.     Lorine Bears

## 2020-10-30 NOTE — Consult Note (Signed)
Cardiology Consultation:   Patient ID: Gerald Williams MRN: [id number]; DOB: Jan 08, 1951  Admit date: 10/29/2020 Date of Consult: 10/30/2020  PCP:  [facility]   [facility] Providers Cardiologist:  Chrystie Nose, MD        Patient Profile:   Gerald Williams is a 70 y.o. male with a hx of hyperlipidemia, hypertension, emphysema who is being seen 10/30/2020 for the evaluation of chest discomfort at the request of Dr.  Jerral Ralph.  History of Present Illness:   Gerald Williams is a patient of Dr. Blanchie Dessert.  He has been seeing a cardiologist at the [facility].  He was admitted yesterday with episodes of chest pain.  The pain started in the left side top of his breast and radiated down his left arm.  It was constant and throbbing.  He denies any syncope.  He denies any diaphoresis.  The pain intensified yesterday morning and he came to the emergency room. Troponin levels are negative. EKG is nonacute.  Previous history of cocaine abuse.  He is at heart catheterization years ago by Dr. Tresa Endo.  Did not require stenting  He has eaten breakfast this am    Past Medical History:  Diagnosis Date   Emphysema    Erectile dysfunction    Hyperlipemia    Hypertension    Polysubstance abuse (HCC)    stopped using crack/cocaine December 4th 20-14   PTSD (post-traumatic stress disorder)    Sleep apnea     Past Surgical History:  Procedure Laterality Date   CARDIAC CATHETERIZATION     CYSTOSCOPY WITH INSERTION OF UROLIFT N/A 08/03/2020   Procedure: CYSTOSCOPY WITH INSERTION OF UROLIFT;  Surgeon: Marcine Matar, MD;  Location: [facility];  Service: Urology;  Laterality: N/A;  30 MINS   LEFT HEART CATHETERIZATION WITH CORONARY ANGIOGRAM N/A 07/15/2014   Procedure: LEFT HEART CATHETERIZATION WITH CORONARY ANGIOGRAM;  Surgeon: Lennette Bihari, MD;  Location: [facility] CATH LAB;  Service: Cardiovascular;  Laterality: N/A;     Home Medications:  Prior to Admission medications   Medication Sig  Start Date End Date Taking? Authorizing Provider  acetaminophen (TYLENOL) 500 MG tablet Take 1,000 mg by mouth every 6 (six) hours as needed for mild pain, fever or headache.   Yes [provider]  albuterol (PROVENTIL) (2.5 MG/3ML) 0.083% nebulizer solution Take 2.5 mg by nebulization every 6 (six) hours as needed for wheezing or shortness of breath.   Yes [provider]  albuterol (VENTOLIN HFA) 108 (90 Base) MCG/ACT inhaler Inhale 2 puffs into the lungs every 6 (six) hours as needed for shortness of breath or wheezing. 09/24/20  Yes Alfonse Spruce, MD  atorvastatin (LIPITOR) 80 MG tablet Take 80 mg by mouth daily. 05/12/20  Yes [provider]  budesonide-formoterol (SYMBICORT) 160-4.5 MCG/ACT inhaler Inhale 2 puffs into the lungs in the morning and at bedtime. 04/07/20  Yes [provider]  guaiFENesin (MUCINEX) 600 MG 12 hr tablet Take 600 mg by mouth 2 (two) times daily. 02/07/20  Yes [provider]  ipratropium (ATROVENT) 0.03 % nasal spray Place 1 spray into both nostrils every 8 (eight) hours as needed for rhinitis. 10/08/20  Yes Alfonse Spruce, MD  Ipratropium-Albuterol (COMBIVENT) 20-100 MCG/ACT AERS respimat Inhale 1 puff into the lungs 2 (two) times daily. 07/16/20  Yes [provider]  lisinopril (ZESTRIL) 40 MG tablet Take 40 mg by mouth daily. 05/12/20  Yes [provider]  metoprolol succinate (TOPROL-XL) 50 MG 24 hr tablet  Take 25 mg by mouth daily. Take with or immediately following a meal.   Yes [provider]  mirtazapine (REMERON) 30 MG tablet Take 30 mg by mouth at bedtime.   Yes [provider]  OXYGEN Inhale 4-5 L into the lungs continuous.   Yes [provider]  prazosin (MINIPRESS) 2 MG capsule Take 2 mg by mouth at bedtime. Take with 5mg  dose to equal 7mg  nightly.   Yes [provider]  prazosin (MINIPRESS) 5 MG capsule Take 5 mg by mouth at bedtime. Take with 2mg   dose to equal 7mg  nightly.   Yes [provider]  azelastine (ASTELIN) 0.1 % nasal spray Place 1 spray into both nostrils 2 (two) times daily. Use in each nostril as directed Patient not taking: Reported on 10/29/2020 09/24/20 10/24/20  Alfonse SpruceGallagher, Joel Louis, MD  bacitracin ointment Apply 1 application topically 2 (two) times daily. Patient not taking: Reported on 10/29/2020 08/17/20   Valinda HoarWhite, Adrienne R, NP  ciprofloxacin (CIPRO) 250 MG tablet Take 1 tablet (250 mg total) by mouth 2 (two) times daily. Patient not taking: Reported on 10/29/2020 08/03/20   Marcine Matarahlstedt, Stephen, MD  glucose blood (COOL BLOOD GLUCOSE TEST STRIPS) test strip Used to test blood sugar x2 daily---diagnosis code r73.03--for one touch verio flex 12/08/15   Pincus SanesBurns, Stacy J, MD  Lancets MISC Used to test blood sugar x2 daily---diagnosis code r73.03--for one touch verio flex 12/08/15   Pincus SanesBurns, Stacy J, MD  predniSONE (DELTASONE) 10 MG tablet Take 1 tablet twice a day for 4 days then 1 tablet on day 5 then stop Patient not taking: Reported on 10/29/2020 10/08/20   Alfonse SpruceGallagher, Joel Louis, MD    Inpatient Medications: Scheduled Meds:  enoxaparin (LOVENOX) injection  40 mg Subcutaneous Q24H   lisinopril  40 mg Oral Daily   Continuous Infusions:  PRN Meds: acetaminophen, ondansetron (ZOFRAN) IV, traMADol  Allergies:    Allergies  Allergen Reactions   Budeson-Glycopyrrol-Formoterol     Other reaction(s): Retention of urine, Constipation, Retention of urine, Constipation   Gabapentin     Other reaction(s): Dizziness   Nortriptyline     Other reaction(s): Drowsy   Oxcarbazepine Nausea And Vomiting   Tiotropium     Other reaction(s): Cough, Dysphagia, Cough, Dysphagia   Tiotropium Bromide-Olodaterol Itching   Olodaterol Hives and Rash    Social History:   Social History   Socioeconomic History   Marital status: Legally Separated    Spouse name: Not on file   Number of children: 4   Years of education: Not on file    Highest education level: Not on file  Occupational History   Occupation: Investment banker, operationalchef - retired  Tobacco Use   Smoking status: Former    Packs/day: 1.50    Years: 50.00    Pack years: 75.00    Types: Cigarettes    Start date: 04/26/1963    Quit date: 09/22/2013    Years since quitting: 7.1   Smokeless tobacco: Never   Tobacco comments:    patient using the patch, losenges, and medication  Vaping Use   Vaping Use: Never used  Substance and Sexual Activity   Alcohol use: No    Alcohol/week: 0.0 standard drinks    Comment: He was drinking 3-4 drinks nightly x 40 years, quit in 2014.     Drug use: No    Comment: history of crack use and [facility]   Sexual activity: Yes  Other Topics Concern  Not on file  Social History Narrative    Education: 14 years and 20 years of [employer]. Recently married.    Social Determinants of Health   Financial Resource Strain: Not on file  Food Insecurity: Not on file  Transportation Needs: Not on file  Physical Activity: Not on file  Stress: Not on file  Social Connections: Not on file  Intimate Partner Violence: Not on file    Family History:    Family History  Problem Relation Age of Onset   Diabetes Mother        Deceased   Heart disease Father        Deceased   Heart attack Father        x5   Cancer - Other Sister    Healthy Son    Healthy Daughter    Heart attack Brother        Deceased   Alcohol abuse Brother        Deceased     ROS:  Please see the history of present illness.   All other ROS reviewed and negative.     Physical Exam/Data:   Vitals:   10/29/20 1445 10/29/20 1748 10/29/20 2339 10/30/20 0846  BP: (!) 149/91  (!) 134/91 127/76  Pulse: 70  (!) 55 80  Resp: 20  20 20   Temp: 97.8 F (36.6 C)  97.6 F (36.4 C) 97.9 F (36.6 C)  TempSrc: Oral  Oral Oral  SpO2: 100%  97% 97%  Weight:  79 kg    Height:  5' 5.5" (1.664 m)      Intake/Output Summary (Last 24 hours) at 10/30/2020 0900 Last data  filed at 10/30/2020 0850 Gross per 24 hour  Intake 540 ml  Output 400 ml  Net 140 ml   Last 3 Weights 10/29/2020 09/24/2020 08/03/2020  Weight (lbs) 174 lb 2.6 oz 174 lb 12.8 oz 173 lb  Weight (kg) 79 kg 79.289 kg 78.472 kg     Body mass index is 28.54 kg/m.  General:  Well nourished, well developed, in no acute distress HEENT: normal Lymph: no adenopathy Neck: no JVD Endocrine:  No thryomegaly Vascular: No carotid bruits; FA pulses 2+ bilaterally without bruits  Cardiac:  normal S1, S2; RRR; no murmur  Lungs:  clear to auscultation bilaterally, no wheezing, rhonchi or rales  Abd: soft, nontender, no hepatomegaly  Ext: no edema Musculoskeletal:  No deformities, BUE and BLE strength normal and equal Skin: warm and dry  Neuro:  CNs 2-12 intact, no focal abnormalities noted Psych:  Normal affect   EKG:  The EKG was personally reviewed and demonstrates:    NSR at 91,  rare PVCs.   No ST or T wave changes.  Telemetry:  Telemetry was personally reviewed and demonstrates:  NSR   Relevant CV Studies:   Laboratory Data:  High Sensitivity Troponin:   Recent Labs  Lab 10/29/20 0506 10/29/20 0645 10/29/20 1250 10/29/20 1532  TROPONINIHS 10 9 9 9      Chemistry Recent Labs  Lab 10/29/20 0506 10/29/20 1250  NA 141  --   K 4.6  --   CL 103  --   CO2 32  --   GLUCOSE 111*  --   BUN 17  --   CREATININE 1.16 1.19  CALCIUM 9.2  --   GFRNONAA >60 >60  ANIONGAP 6  --     Recent Labs  Lab 10/29/20 0506  PROT 7.3  ALBUMIN 3.7  AST 20  ALT  16  ALKPHOS 74  BILITOT 0.6   Hematology Recent Labs  Lab 10/29/20 0506 10/29/20 1250  WBC 5.1 4.7  RBC 4.01* 4.17*  HGB 11.8* 12.5*  HCT 37.0* 38.5*  MCV 92.3 92.3  MCH 29.4 30.0  MCHC 31.9 32.5  RDW 13.4 13.3  PLT 243 256   BNP Recent Labs  Lab 10/29/20 0506  BNP 38.9    DDimer No results for input(s): DDIMER in the last 168 hours.   Radiology/Studies:  DG Chest 2 View  Result Date: 10/29/2020 CLINICAL DATA:  Chest  pain EXAM: CHEST - 2 VIEW COMPARISON:  05/06/2019 FINDINGS: Mild linear scarring bilaterally, stable. There are large lung volumes and emphysema by 2019 CT. There is no edema, consolidation, effusion, or pneumothorax. Normal heart size and mediastinal contours. IMPRESSION: No acute finding. COPD. Electronically Signed   By: Marnee Spring M.D.   On: 10/29/2020 05:19   ECHOCARDIOGRAM COMPLETE  Result Date: 10/29/2020    ECHOCARDIOGRAM REPORT   Patient Name:   Gerald Williams Date of Exam: 10/29/2020 Medical Rec #:  [id number]      Height:       65.5 in Accession #:    [id number]     Weight:       174.8 lb Date of Birth:  [birth year]      BSA:          1.879 m Patient Age:    69 years       BP:           138/88 mmHg Patient Gender: M              HR:           81 bpm. Exam Location:  Inpatient Procedure: 2D Echo, Cardiac Doppler and Color Doppler Indications:    Chest Pain R07.9  History:        Patient has prior history of Echocardiogram examinations, most                 recent 05/07/2019. Risk Factors:Sleep Apnea, Hypertension,                 Dyslipidemia and Former Smoker. Polysubstance abuse.  Sonographer:    Renella Cunas RDCS Referring Phys: [id number] Teddy Spike  Sonographer Comments: Image acquisition challenging due to patient body habitus and Image acquisition challenging due to respiratory motion. IMPRESSIONS  1. Left ventricular ejection fraction, by estimation, is 35%. The left ventricle has moderately decreased function. The left ventricle demonstrates global hypokinesis. Left ventricular diastolic parameters are consistent with Grade I diastolic dysfunction (impaired relaxation).  2. Right ventricular systolic function is mildly reduced. The right ventricular size is normal. Tricuspid regurgitation signal is inadequate for assessing PA pressure.  3. The mitral valve is normal in structure. No evidence of mitral valve regurgitation. No evidence of mitral stenosis.  4. The aortic valve was not well  visualized. Aortic valve regurgitation is not visualized. No aortic stenosis is present.  5. Aortic dilatation noted. There is mild dilatation of the aortic root, measuring 37 mm.  6. The inferior vena cava is normal in size with greater than 50% respiratory variability, suggesting right atrial pressure of 3 mmHg.  7. Technically difficult study with poor acoustic windows. FINDINGS  Left Ventricle: Left ventricular ejection fraction, by estimation, is 35%. The left ventricle has moderately decreased function. The left ventricle demonstrates global hypokinesis. The left ventricular internal cavity size was normal in size. There is no left ventricular hypertrophy. Left  ventricular diastolic parameters are consistent with Grade I diastolic dysfunction (impaired relaxation). Right Ventricle: The right ventricular size is normal. No increase in right ventricular wall thickness. Right ventricular systolic function is mildly reduced. Tricuspid regurgitation signal is inadequate for assessing PA pressure. Left Atrium: Left atrial size was normal in size. Right Atrium: Right atrial size was normal in size. Pericardium: There is no evidence of pericardial effusion. Mitral Valve: The mitral valve is normal in structure. No evidence of mitral valve regurgitation. No evidence of mitral valve stenosis. Tricuspid Valve: The tricuspid valve is not well visualized. Tricuspid valve regurgitation is not demonstrated. Aortic Valve: The aortic valve was not well visualized. Aortic valve regurgitation is not visualized. No aortic stenosis is present. Pulmonic Valve: The pulmonic valve was not well visualized. Pulmonic valve regurgitation is not visualized. Aorta: Aortic dilatation noted. There is mild dilatation of the aortic root, measuring 37 mm. Venous: The inferior vena cava is normal in size with greater than 50% respiratory variability, suggesting right atrial pressure of 3 mmHg. IAS/Shunts: No atrial level shunt detected by color  flow Doppler.  LEFT VENTRICLE PLAX 2D LVIDd:         4.70 cm     Diastology LVIDs:         3.90 cm     LV e' medial:    6.08 cm/s LV PW:         0.80 cm     LV E/e' medial:  6.2 LV IVS:        0.80 cm     LV e' lateral:   7.10 cm/s LVOT diam:     2.30 cm     LV E/e' lateral: 5.3 LV SV:         62 LV SV Index:   33 LVOT Area:     4.15 cm  LV Volumes (MOD) LV vol d, MOD A4C: 89.4 ml LV vol s, MOD A4C: 51.3 ml LV SV MOD A4C:     89.4 ml RIGHT VENTRICLE RV S prime:     9.11 cm/s TAPSE (M-mode): 1.9 cm LEFT ATRIUM           Index       RIGHT ATRIUM          Index LA diam:      2.90 cm 1.54 cm/m  RA Area:     9.23 cm LA Vol (A2C): 19.4 ml 10.33 ml/m RA Volume:   19.50 ml 10.38 ml/m LA Vol (A4C): 19.7 ml 10.49 ml/m  AORTIC VALVE LVOT Vmax:   85.50 cm/s LVOT Vmean:  60.500 cm/s LVOT VTI:    0.149 m  AORTA Ao Root diam: 3.70 cm MITRAL VALVE MV Area (PHT): 3.83 cm    SHUNTS MV Decel Time: 198 msec    Systemic VTI:  0.15 m MV E velocity: 37.70 cm/s  Systemic Diam: 2.30 cm MV A velocity: 67.70 cm/s MV E/A ratio:  0.56 Marca Ancona MD Electronically signed by Marca Ancona MD Signature Date/Time: 10/29/2020/5:03:27 PM    Final      Assessment and Plan:   Chest pain :  atypical .   Constant for 3 days.   Despite the constant pain, troponins are negative,  ECG is unremarkable .  Pain sound nonacute but he is now found to have a reduced LVEF. He will need a cath   2.   Newly diagnosed CHF:   if is 35%  .  This is reduced from his echo last year  which showed EF 50%.   He reports no cocaine use in 10 years He has been taking his BP meds.   Will schedule him for R and L heart cath today   I have discussed risks, benefits, options. He understands and agrees to proceed     Risk Assessment/Risk Scores:  {      For questions or updates, please contact [facility] Please consult [email or web address] for contact info under    Signed, Kristeen Miss, MD  10/30/2020 9:00 AM

## 2020-10-30 NOTE — Progress Notes (Signed)
TR BAND REMOVAL  LOCATION:    Right radial  DEFLATED PER PROTOCOL:    Yes.    TIME BAND OFF / DRESSING APPLIED:    1845 a clean dry dressing appiled with guaze and tegaderm Cover with coban  Level 0  SITE AFTER BAND REMOVAL:    Level 0  CIRCULATION SENSATION AND MOVEMENT:    Within Normal Limits   Yes.    COMMENTS:    Care instruction given

## 2020-10-30 NOTE — Plan of Care (Signed)
  Problem: Education: Goal: Knowledge of General Education information will improve Description: Including pain rating scale, medication(s)/side effects and non-pharmacologic comfort measures Outcome: Progressing   Problem: Clinical Measurements: Goal: Cardiovascular complication will be avoided Outcome: Progressing   Problem: Coping: Goal: Level of anxiety will decrease Outcome: Progressing   Problem: Pain Managment: Goal: General experience of comfort will improve Outcome: Progressing   

## 2020-10-30 NOTE — Progress Notes (Signed)
PROGRESS NOTE    Gerald Williams  UXL:[id number] DOB: 1951/02/28 DOA: 10/29/2020 PCP: [facility]    Brief Narrative:  70 year old gentleman with history of advanced COPD on 4 to 6 L of oxygen, hypertension, hyperlipidemia presented to the emergency room with 2 days of intermittent left-sided chest pain radiating to left arm.  He had some relief with aspirin and double dose of Tylenol so he was worried about his heart and came to the emergency room.  At the emergency room hemodynamically stable.  Troponins negative.  EKG is nonischemic.  Chest pain improved with aspirin.  Echocardiogram revealed decreased ejection fraction as compared to previous.  Admitted to rule out acute coronary syndrome.   Assessment & Plan:   Active Problems:   Chest pain  Chest pain: In a patient with multiple risk factors.  Currently hemodynamically stable and chest pain-free.  Serial troponins and EKG nonischemic. Repeat echocardiogram with significantly reduced ejection fraction and will need ischemic evaluation. Case discussed with cardiology, seen in consultation and plan for cardiac catheterization today. Will start patient on aspirin, not taking before presentation. Patient is on metoprolol, will resume.  He already on lisinopril and atorvastatin.  Emphysema, chronic hypoxemic respiratory failure on 4 L oxygen at home: At about his baseline.  He is on bronchodilator regimen with Combivent and Symbicort that will be continued.  Hyperlipidemia: On atorvastatin 40 mg.  LDL is 141.  If positive for coronary disease, will need high intensity statin.  Hypertension: Stable on current regimen of lisinopril and metoprolol.  BPH: Currently symptoms controlled on prazosin.   DVT prophylaxis: enoxaparin (LOVENOX) injection 40 mg Start: 10/29/20 1800   Code Status: Full code Family Communication: None at the bedside Disposition Plan: Status is: Observation  The patient remains OBS appropriate and  will d/c before 2 midnights.  Dispo: The patient is from: Home              Anticipated d/c is to: Home              Patient currently is not medically stable to d/c.   Difficult to place patient No         Consultants:  Cardiology  Procedures:  Cardiac cath planned for today  Antimicrobials:  None   Subjective: Patient seen and examined.  He still complains of intermittent dull left breast area pain.  Not reproducible.  Denies any excessive shortness of breath or wheezing.  Worried about his heart.  No other overnight events.  Telemetry with sinus rhythm.  Objective: Vitals:   10/29/20 1445 10/29/20 1748 10/29/20 2339 10/30/20 0846  BP: (!) 149/91  (!) 134/91 127/76  Pulse: 70  (!) 55 80  Resp: 20  20 20   Temp: 97.8 F (36.6 C)  97.6 F (36.4 C) 97.9 F (36.6 C)  TempSrc: Oral  Oral Oral  SpO2: 100%  97% 97%  Weight:  79 kg    Height:  5' 5.5" (1.664 m)      Intake/Output Summary (Last 24 hours) at 10/30/2020 0928 Last data filed at 10/30/2020 0850 Gross per 24 hour  Intake 540 ml  Output 400 ml  Net 140 ml   Filed Weights   10/29/20 1748  Weight: 79 kg    Examination:  General exam: Appears calm and comfortable , currently on 4 L oxygen. Respiratory system: Clear to auscultation. Respiratory effort normal. Cardiovascular system: S1 & S2 heard, RRR. No JVD, murmurs, rubs, gallops or clicks. No pedal edema. Gastrointestinal system:  Abdomen is nondistended, soft and nontender. No organomegaly or masses felt. Normal bowel sounds heard. Central nervous system: Alert and oriented. No focal neurological deficits. Extremities: Symmetric 5 x 5 power. Skin: No rashes, lesions or ulcers Psychiatry: Judgement and insight appear normal. Mood & affect appropriate.     Data Reviewed: I have personally reviewed following labs and imaging studies  CBC: Recent Labs  Lab 10/29/20 0506 10/29/20 1250  WBC 5.1 4.7  NEUTROABS 3.1  --   HGB 11.8* 12.5*  HCT 37.0*  38.5*  MCV 92.3 92.3  PLT 243 256   Basic Metabolic Panel: Recent Labs  Lab 10/29/20 0506 10/29/20 1250  NA 141  --   K 4.6  --   CL 103  --   CO2 32  --   GLUCOSE 111*  --   BUN 17  --   CREATININE 1.16 1.19  CALCIUM 9.2  --    GFR: Estimated Creatinine Clearance: 58.2 mL/min (by C-G formula based on SCr of 1.19 mg/dL). Liver Function Tests: Recent Labs  Lab 10/29/20 0506  AST 20  ALT 16  ALKPHOS 74  BILITOT 0.6  PROT 7.3  ALBUMIN 3.7   No results for input(s): LIPASE, AMYLASE in the last 168 hours. No results for input(s): AMMONIA in the last 168 hours. Coagulation Profile: No results for input(s): INR, PROTIME in the last 168 hours. Cardiac Enzymes: No results for input(s): CKTOTAL, CKMB, CKMBINDEX, TROPONINI in the last 168 hours. BNP (last 3 results) No results for input(s): PROBNP in the last 8760 hours. HbA1C: No results for input(s): HGBA1C in the last 72 hours. CBG: No results for input(s): GLUCAP in the last 168 hours. Lipid Profile: Recent Labs    10/29/20 1250  CHOL 244*  HDL 79  LDLCALC 147*  TRIG 92  CHOLHDL 3.1   Thyroid Function Tests: No results for input(s): TSH, T4TOTAL, FREET4, T3FREE, THYROIDAB in the last 72 hours. Anemia Panel: No results for input(s): VITAMINB12, FOLATE, FERRITIN, TIBC, IRON, RETICCTPCT in the last 72 hours. Sepsis Labs: No results for input(s): PROCALCITON, LATICACIDVEN in the last 168 hours.  Recent Results (from the past 240 hour(s))  SARS CORONAVIRUS 2 (TAT 6-24 HRS) Nasopharyngeal Nasopharyngeal Swab     Status: None   Collection Time: 10/29/20  3:07 PM   Specimen: Nasopharyngeal Swab  Result Value Ref Range Status   SARS Coronavirus 2 NEGATIVE NEGATIVE Final    Comment: (NOTE) SARS-CoV-2 target nucleic acids are NOT DETECTED.  The SARS-CoV-2 RNA is generally detectable in upper and lower respiratory specimens during the acute phase of infection. Negative results do not preclude SARS-CoV-2 infection,  do not rule out co-infections with other pathogens, and should not be used as the sole basis for treatment or other patient management decisions. Negative results must be combined with clinical observations, patient history, and epidemiological information. The expected result is Negative.  Fact Sheet for Patients: HairSlick.no  Fact Sheet for Healthcare Providers: quierodirigir.com  This test is not yet approved or cleared by the [place] FDA and  has been authorized for detection and/or diagnosis of SARS-CoV-2 by FDA under an Emergency Use Authorization (EUA). This EUA will remain  in effect (meaning this test can be used) for the duration of the COVID-19 declaration under Se ction 564(b)(1) of the Act, 21 U.S.C. section 360bbb-3(b)(1), unless the authorization is terminated or revoked sooner.  Performed at [facility], [address], [place], [place] [address]  Radiology Studies: DG Chest 2 View  Result Date: 10/29/2020 CLINICAL DATA:  Chest pain EXAM: CHEST - 2 VIEW COMPARISON:  05/06/2019 FINDINGS: Mild linear scarring bilaterally, stable. There are large lung volumes and emphysema by 2019 CT. There is no edema, consolidation, effusion, or pneumothorax. Normal heart size and mediastinal contours. IMPRESSION: No acute finding. COPD. Electronically Signed   By: Marnee Spring M.D.   On: 10/29/2020 05:19   ECHOCARDIOGRAM COMPLETE  Result Date: 10/29/2020    ECHOCARDIOGRAM REPORT   Patient Name:   Gerald Williams Date of Exam: 10/29/2020 Medical Rec #:  [id number]      Height:       65.5 in Accession #:    [id number]     Weight:       174.8 lb Date of Birth:  09/22/1950      BSA:          1.879 m Patient Age:    69 years       BP:           138/88 mmHg Patient Gender: M              HR:           81 bpm. Exam Location:  Inpatient Procedure: 2D Echo, Cardiac Doppler and Color Doppler Indications:    Chest Pain  R07.9  History:        Patient has prior history of Echocardiogram examinations, most                 recent 05/07/2019. Risk Factors:Sleep Apnea, Hypertension,                 Dyslipidemia and Former Smoker. Polysubstance abuse.  Sonographer:    Renella Cunas RDCS Referring Phys: [id number] Teddy Spike  Sonographer Comments: Image acquisition challenging due to patient body habitus and Image acquisition challenging due to respiratory motion. IMPRESSIONS  1. Left ventricular ejection fraction, by estimation, is 35%. The left ventricle has moderately decreased function. The left ventricle demonstrates global hypokinesis. Left ventricular diastolic parameters are consistent with Grade I diastolic dysfunction (impaired relaxation).  2. Right ventricular systolic function is mildly reduced. The right ventricular size is normal. Tricuspid regurgitation signal is inadequate for assessing PA pressure.  3. The mitral valve is normal in structure. No evidence of mitral valve regurgitation. No evidence of mitral stenosis.  4. The aortic valve was not well visualized. Aortic valve regurgitation is not visualized. No aortic stenosis is present.  5. Aortic dilatation noted. There is mild dilatation of the aortic root, measuring 37 mm.  6. The inferior vena cava is normal in size with greater than 50% respiratory variability, suggesting right atrial pressure of 3 mmHg.  7. Technically difficult study with poor acoustic windows. FINDINGS  Left Ventricle: Left ventricular ejection fraction, by estimation, is 35%. The left ventricle has moderately decreased function. The left ventricle demonstrates global hypokinesis. The left ventricular internal cavity size was normal in size. There is no left ventricular hypertrophy. Left ventricular diastolic parameters are consistent with Grade I diastolic dysfunction (impaired relaxation). Right Ventricle: The right ventricular size is normal. No increase in right ventricular wall thickness. Right  ventricular systolic function is mildly reduced. Tricuspid regurgitation signal is inadequate for assessing PA pressure. Left Atrium: Left atrial size was normal in size. Right Atrium: Right atrial size was normal in size. Pericardium: There is no evidence of pericardial effusion. Mitral Valve: The mitral valve is normal in structure. No evidence of mitral  valve regurgitation. No evidence of mitral valve stenosis. Tricuspid Valve: The tricuspid valve is not well visualized. Tricuspid valve regurgitation is not demonstrated. Aortic Valve: The aortic valve was not well visualized. Aortic valve regurgitation is not visualized. No aortic stenosis is present. Pulmonic Valve: The pulmonic valve was not well visualized. Pulmonic valve regurgitation is not visualized. Aorta: Aortic dilatation noted. There is mild dilatation of the aortic root, measuring 37 mm. Venous: The inferior vena cava is normal in size with greater than 50% respiratory variability, suggesting right atrial pressure of 3 mmHg. IAS/Shunts: No atrial level shunt detected by color flow Doppler.  LEFT VENTRICLE PLAX 2D LVIDd:         4.70 cm     Diastology LVIDs:         3.90 cm     LV e' medial:    6.08 cm/s LV PW:         0.80 cm     LV E/e' medial:  6.2 LV IVS:        0.80 cm     LV e' lateral:   7.10 cm/s LVOT diam:     2.30 cm     LV E/e' lateral: 5.3 LV SV:         62 LV SV Index:   33 LVOT Area:     4.15 cm  LV Volumes (MOD) LV vol d, MOD A4C: 89.4 ml LV vol s, MOD A4C: 51.3 ml LV SV MOD A4C:     89.4 ml RIGHT VENTRICLE RV S prime:     9.11 cm/s TAPSE (M-mode): 1.9 cm LEFT ATRIUM           Index       RIGHT ATRIUM          Index LA diam:      2.90 cm 1.54 cm/m  RA Area:     9.23 cm LA Vol (A2C): 19.4 ml 10.33 ml/m RA Volume:   19.50 ml 10.38 ml/m LA Vol (A4C): 19.7 ml 10.49 ml/m  AORTIC VALVE LVOT Vmax:   85.50 cm/s LVOT Vmean:  60.500 cm/s LVOT VTI:    0.149 m  AORTA Ao Root diam: 3.70 cm MITRAL VALVE MV Area (PHT): 3.83 cm    SHUNTS MV Decel  Time: 198 msec    Systemic VTI:  0.15 m MV E velocity: 37.70 cm/s  Systemic Diam: 2.30 cm MV A velocity: 67.70 cm/s MV E/A ratio:  0.56 Marca Ancona MD Electronically signed by Marca Ancona MD Signature Date/Time: 10/29/2020/5:03:27 PM    Final         Scheduled Meds:  aspirin EC  81 mg Oral Daily   enoxaparin (LOVENOX) injection  40 mg Subcutaneous Q24H   lisinopril  40 mg Oral Daily   Continuous Infusions:   LOS: 0 days    Time spent: 35 minutes    Dorcas Carrow, MD [facility] Pager [phone]

## 2020-10-30 NOTE — Progress Notes (Signed)
Received patient from [facility] via [facility] alert and oirented X4, skin warm and dry, resp even and unlabored.  Consent signed, SL in right hand .  O2 at 5 liters  Pt to cath lab for procedure.

## 2020-10-30 NOTE — H&P (View-Only) (Signed)
Cardiology Consultation:   Patient ID: Gerald Williams MRN: [id number]; DOB: 02/12/1951  Admit date: 10/29/2020 Date of Consult: 10/30/2020  PCP:  [facility]   [facility] Providers Cardiologist:  Kenneth C Hilty, MD        Patient Profile:   Gerald Williams is a 70 y.o. male with a hx of hyperlipidemia, hypertension, emphysema who is being seen 10/30/2020 for the evaluation of chest discomfort at the request of Dr.  Ghimire.  History of Present Illness:   Gerald Williams is a patient of Dr. Hilty's.  He has been seeing a [facility].  He was admitted yesterday with episodes of chest pain.  The pain started in the left side top of his breast and radiated down his left arm.  It was constant and throbbing.  He denies any syncope.  He denies any diaphoresis.  The pain intensified yesterday morning and he came to the emergency room. Troponin levels are negative. EKG is nonacute.  Previous history of cocaine abuse.  He is at heart catheterization years ago by Dr. Kelly.  Did not require stenting  He has eaten breakfast this am    Past Medical History:  Diagnosis Date   Emphysema    Erectile dysfunction    Hyperlipemia    Hypertension    Polysubstance abuse (HCC)    stopped using crack/cocaine December 4th 20-14   PTSD (post-traumatic stress disorder)    Sleep apnea     Past Surgical History:  Procedure Laterality Date   CARDIAC CATHETERIZATION     CYSTOSCOPY WITH INSERTION OF UROLIFT N/A 08/03/2020   Procedure: CYSTOSCOPY WITH INSERTION OF UROLIFT;  Surgeon: Dahlstedt, Stephen, MD;  Location: [facility];  Service: Urology;  Laterality: N/A;  30 MINS   LEFT HEART CATHETERIZATION WITH CORONARY ANGIOGRAM N/A 07/15/2014   Procedure: LEFT HEART CATHETERIZATION WITH CORONARY ANGIOGRAM;  Surgeon: Thomas A Kelly, MD;  Location: [facility]ATH [facility];  Service: Cardiovascular;  Laterality: N/A;     Home Medications:  Prior to Admission medications   Medication Sig  Start Date End Date Taking? Authorizing Provider  acetaminophen (TYLENOL) 500 MG tablet Take 1,000 mg by mouth every 6 (six) hours as needed for mild pain, fever or headache.   Yes [provider]  albuterol (PROVENTIL) (2.5 MG/3ML) 0.083% nebulizer solution Take 2.5 mg by nebulization every 6 (six) hours as needed for wheezing or shortness of breath.   Yes [provider]  albuterol (VENTOLIN HFA) 108 (90 Base) MCG/ACT inhaler Inhale 2 puffs into the lungs every 6 (six) hours as needed for shortness of breath or wheezing. 09/24/20  Yes Gallagher, Joel Louis, MD  atorvastatin (LIPITOR) 80 MG tablet Take 80 mg by mouth daily. 05/12/20  Yes [provider]  budesonide-formoterol (SYMBICORT) 160-4.5 MCG/ACT inhaler Inhale 2 puffs into the lungs in the morning and at bedtime. 04/07/20  Yes [provider]  guaiFENesin (MUCINEX) 600 MG 12 hr tablet Take 600 mg by mouth 2 (two) times daily. 02/07/20  Yes [provider]  ipratropium (ATROVENT) 0.03 % nasal spray Place 1 spray into both nostrils every 8 (eight) hours as needed for rhinitis. 10/08/20  Yes Gallagher, Joel Louis, MD  Ipratropium-Albuterol (COMBIVENT) 20-100 MCG/ACT AERS respimat Inhale 1 puff into the lungs 2 (two) times daily. 07/16/20  Yes [provider]  lisinopril (ZESTRIL) 40 MG tablet Take 40 mg by mouth daily. 05/12/20  Yes [provider]  metoprolol succinate (TOPROL-XL) 50 MG 24 hr tablet   Take 25 mg by mouth daily. Take with or immediately following a meal.   Yes [provider]  mirtazapine (REMERON) 30 MG tablet Take 30 mg by mouth at bedtime.   Yes [provider]  OXYGEN Inhale 4-5 L into the lungs continuous.   Yes [provider]  prazosin (MINIPRESS) 2 MG capsule Take 2 mg by mouth at bedtime. Take with 5mg dose to equal 7mg nightly.   Yes [provider]  prazosin (MINIPRESS) 5 MG capsule Take 5 mg by mouth at bedtime. Take with 2mg  dose to equal 7mg nightly.   Yes [provider]  azelastine (ASTELIN) 0.1 % nasal spray Place 1 spray into both nostrils 2 (two) times daily. Use in each nostril as directed Patient not taking: Reported on 10/29/2020 09/24/20 10/24/20  Gallagher, Joel Louis, MD  bacitracin ointment Apply 1 application topically 2 (two) times daily. Patient not taking: Reported on 10/29/2020 08/17/20   White, Adrienne R, NP  ciprofloxacin (CIPRO) 250 MG tablet Take 1 tablet (250 mg total) by mouth 2 (two) times daily. Patient not taking: Reported on 10/29/2020 08/03/20   Dahlstedt, Stephen, MD  glucose blood (COOL BLOOD GLUCOSE TEST STRIPS) test strip Used to test blood sugar x2 daily---diagnosis code r73.03--for one touch verio flex 12/08/15   Burns, Stacy J, MD  Lancets MISC Used to test blood sugar x2 daily---diagnosis code r73.03--for one touch verio flex 12/08/15   Burns, Stacy J, MD  predniSONE (DELTASONE) 10 MG tablet Take 1 tablet twice a day for 4 days then 1 tablet on day 5 then stop Patient not taking: Reported on 10/29/2020 10/08/20   Gallagher, Joel Louis, MD    Inpatient Medications: Scheduled Meds:  enoxaparin (LOVENOX) injection  40 mg Subcutaneous Q24H   lisinopril  40 mg Oral Daily   Continuous Infusions:  PRN Meds: acetaminophen, ondansetron (ZOFRAN) IV, traMADol  Allergies:    Allergies  Allergen Reactions   Budeson-Glycopyrrol-Formoterol     Other reaction(s): Retention of urine, Constipation, Retention of urine, Constipation   Gabapentin     Other reaction(s): Dizziness   Nortriptyline     Other reaction(s): Drowsy   Oxcarbazepine Nausea And Vomiting   Tiotropium     Other reaction(s): Cough, Dysphagia, Cough, Dysphagia   Tiotropium Bromide-Olodaterol Itching   Olodaterol Hives and Rash    Social History:   Social History   Socioeconomic History   Marital status: Legally Separated    Spouse name: Not on file   Number of children: 4   Years of education: Not on file    Highest education level: Not on file  Occupational History   Occupation: chef - retired  Tobacco Use   Smoking status: Former    Packs/day: 1.50    Years: 50.00    Pack years: 75.00    Types: Cigarettes    Start date: 04/26/1963    Quit date: 09/22/2013    Years since quitting: 7.1   Smokeless tobacco: Never   Tobacco comments:    patient using the patch, losenges, and medication  Vaping Use   Vaping Use: Never used  Substance and Sexual Activity   Alcohol use: No    Alcohol/week: 0.0 standard drinks    Comment: He was drinking 3-4 drinks nightly x 40 years, quit in 2014.     Drug use: No    Comment: history [facility] crack [facility]   Sexual activity: Yes  Other Topics Concern     Not on file  Social History Narrative    Education: 14 years and 20 years of military. Recently married.    Social Determinants of Health   Financial Resource Strain: Not on file  Food Insecurity: Not on file  Transportation Needs: Not on file  Physical Activity: Not on file  Stress: Not on file  Social Connections: Not on file  Intimate Partner Violence: Not on file    Family History:    Family History  Problem Relation Age of Onset   Diabetes Mother        Deceased   Heart disease Father        Deceased   Heart attack Father        x5   Cancer - Other Sister    Healthy Son    Healthy Daughter    Heart attack Brother        Deceased   Alcohol abuse Brother        Deceased     ROS:  Please see the history of present illness.   All other ROS reviewed and negative.     Physical Exam/Data:   Vitals:   10/29/20 1445 10/29/20 1748 10/29/20 2339 10/30/20 0846  BP: (!) 149/91  (!) 134/91 127/76  Pulse: 70  (!) 55 80  Resp: 20  20 20  Temp: 97.8 F (36.6 C)  97.6 F (36.4 C) 97.9 F (36.6 C)  TempSrc: Oral  Oral Oral  SpO2: 100%  97% 97%  Weight:  79 kg    Height:  5' 5.5" (1.664 m)      Intake/Output Summary (Last 24 hours) at 10/30/2020 0900 Last data  filed at 10/30/2020 0850 Gross per 24 hour  Intake 540 ml  Output 400 ml  Net 140 ml   Last 3 Weights 10/29/2020 09/24/2020 08/03/2020  Weight (lbs) 174 lb 2.6 oz 174 lb 12.8 oz 173 lb  Weight (kg) 79 kg 79.289 kg 78.472 kg     Body mass index is 28.54 kg/m.  General:  Well nourished, well developed, in no acute distress HEENT: normal Lymph: no adenopathy Neck: no JVD Endocrine:  No thryomegaly Vascular: No carotid bruits; FA pulses 2+ bilaterally without bruits  Cardiac:  normal S1, S2; RRR; no murmur  Lungs:  clear to auscultation bilaterally, no wheezing, rhonchi or rales  Abd: soft, nontender, no hepatomegaly  Ext: no edema Musculoskeletal:  No deformities, BUE and BLE strength normal and equal Skin: warm and dry  Neuro:  CNs 2-12 intact, no focal abnormalities noted Psych:  Normal affect   EKG:  The EKG was personally reviewed and demonstrates:    NSR at 91,  rare PVCs.   No ST or T wave changes.  Telemetry:  Telemetry was personally reviewed and demonstrates:  NSR   Relevant CV Studies:   Laboratory Data:  High Sensitivity Troponin:   Recent Labs  Lab 10/29/20 0506 10/29/20 0645 10/29/20 1250 10/29/20 1532  TROPONINIHS 10 9 9 9     Chemistry Recent Labs  Lab 10/29/20 0506 10/29/20 1250  NA 141  --   K 4.6  --   CL 103  --   CO2 32  --   GLUCOSE 111*  --   BUN 17  --   CREATININE 1.16 1.19  CALCIUM 9.2  --   GFRNONAA >60 >60  ANIONGAP 6  --     Recent Labs  Lab 10/29/20 0506  PROT 7.3  ALBUMIN 3.7  AST 20  ALT   16  ALKPHOS 74  BILITOT 0.6   Hematology Recent Labs  Lab 10/29/20 0506 10/29/20 1250  WBC 5.1 4.7  RBC 4.01* 4.17*  HGB 11.8* 12.5*  HCT 37.0* 38.5*  MCV 92.3 92.3  MCH 29.4 30.0  MCHC 31.9 32.5  RDW 13.4 13.3  PLT 243 256   BNP Recent Labs  Lab 10/29/20 0506  BNP 38.9    DDimer No results for input(s): DDIMER in the last 168 hours.   Radiology/Studies:  DG Chest 2 View  Result Date: 10/29/2020 CLINICAL DATA:  Chest  pain EXAM: CHEST - 2 VIEW COMPARISON:  05/06/2019 FINDINGS: Mild linear scarring bilaterally, stable. There are large lung volumes and emphysema by 2019 CT. There is no edema, consolidation, effusion, or pneumothorax. Normal heart size and mediastinal contours. IMPRESSION: No acute finding. COPD. Electronically Signed   By: Jonathon  Watts M.D.   On: 10/29/2020 05:19   ECHOCARDIOGRAM COMPLETE  Result Date: 10/29/2020    ECHOCARDIOGRAM REPORT   Patient Name:   Tailor Huertas Date of Exam: 10/29/2020 Medical Rec #:  [id number]      Height:       65.5 in Accession #:    [id number]     Weight:       174.8 lb Date of Birth:  05/02/1950      BSA:          1.879 m Patient Age:    69 years       BP:           138/88 mmHg Patient Gender: M              HR:           81 bpm. Exam Location:  Inpatient Procedure: 2D Echo, Cardiac Doppler and Color Doppler Indications:    Chest Pain R07.9  History:        Patient has prior history of Echocardiogram examinations, most                 recent 05/07/2019. Risk Factors:Sleep Apnea, Hypertension,                 Dyslipidemia and Former Smoker. Polysubstance abuse.  Sonographer:    Julia Swaim RDCS Referring Phys: [id number] TYRONE A KYLE  Sonographer Comments: Image acquisition challenging due to patient body habitus and Image acquisition challenging due to respiratory motion. IMPRESSIONS  1. Left ventricular ejection fraction, by estimation, is 35%. The left ventricle has moderately decreased function. The left ventricle demonstrates global hypokinesis. Left ventricular diastolic parameters are consistent with Grade I diastolic dysfunction (impaired relaxation).  2. Right ventricular systolic function is mildly reduced. The right ventricular size is normal. Tricuspid regurgitation signal is inadequate for assessing PA pressure.  3. The mitral valve is normal in structure. No evidence of mitral valve regurgitation. No evidence of mitral stenosis.  4. The aortic valve was not well  visualized. Aortic valve regurgitation is not visualized. No aortic stenosis is present.  5. Aortic dilatation noted. There is mild dilatation of the aortic root, measuring 37 mm.  6. The inferior vena cava is normal in size with greater than 50% respiratory variability, suggesting right atrial pressure of 3 mmHg.  7. Technically difficult study with poor acoustic windows. FINDINGS  Left Ventricle: Left ventricular ejection fraction, by estimation, is 35%. The left ventricle has moderately decreased function. The left ventricle demonstrates global hypokinesis. The left ventricular internal cavity size was normal in size. There is no left ventricular hypertrophy. Left   ventricular diastolic parameters are consistent with Grade I diastolic dysfunction (impaired relaxation). Right Ventricle: The right ventricular size is normal. No increase in right ventricular wall thickness. Right ventricular systolic function is mildly reduced. Tricuspid regurgitation signal is inadequate for assessing PA pressure. Left Atrium: Left atrial size was normal in size. Right Atrium: Right atrial size was normal in size. Pericardium: There is no evidence of pericardial effusion. Mitral Valve: The mitral valve is normal in structure. No evidence of mitral valve regurgitation. No evidence of mitral valve stenosis. Tricuspid Valve: The tricuspid valve is not well visualized. Tricuspid valve regurgitation is not demonstrated. Aortic Valve: The aortic valve was not well visualized. Aortic valve regurgitation is not visualized. No aortic stenosis is present. Pulmonic Valve: The pulmonic valve was not well visualized. Pulmonic valve regurgitation is not visualized. Aorta: Aortic dilatation noted. There is mild dilatation of the aortic root, measuring 37 mm. Venous: The inferior vena cava is normal in size with greater than 50% respiratory variability, suggesting right atrial pressure of 3 mmHg. IAS/Shunts: No atrial level shunt detected by color  flow Doppler.  LEFT VENTRICLE PLAX 2D LVIDd:         4.70 cm     Diastology LVIDs:         3.90 cm     LV e' medial:    6.08 cm/s LV PW:         0.80 cm     LV E/e' medial:  6.2 LV IVS:        0.80 cm     LV e' lateral:   7.10 cm/s LVOT diam:     2.30 cm     LV E/e' lateral: 5.3 LV SV:         62 LV SV Index:   33 LVOT Area:     4.15 cm  LV Volumes (MOD) LV vol d, MOD A4C: 89.4 ml LV vol s, MOD A4C: 51.3 ml LV SV MOD A4C:     89.4 ml RIGHT VENTRICLE RV S prime:     9.11 cm/s TAPSE (M-mode): 1.9 cm LEFT ATRIUM           Index       RIGHT ATRIUM          Index LA diam:      2.90 cm 1.54 cm/m  RA Area:     9.23 cm LA Vol (A2C): 19.4 ml 10.33 ml/m RA Volume:   19.50 ml 10.38 ml/m LA Vol (A4C): 19.7 ml 10.49 ml/m  AORTIC VALVE LVOT Vmax:   85.50 cm/s LVOT Vmean:  60.500 cm/s LVOT VTI:    0.149 m  AORTA Ao Root diam: 3.70 cm MITRAL VALVE MV Area (PHT): 3.83 cm    SHUNTS MV Decel Time: 198 msec    Systemic VTI:  0.15 m MV E velocity: 37.70 cm/s  Systemic Diam: 2.30 cm MV A velocity: 67.70 cm/s MV E/A ratio:  0.56 Dalton Mclean MD Electronically signed by Dalton Mclean MD Signature Date/Time: 10/29/2020/5:03:27 PM    Final      Assessment and Plan:   Chest pain :  atypical .   Constant for 3 days.   Despite the constant pain, troponins are negative,  ECG is unremarkable .  Pain sound nonacute but he is now found to have a reduced LVEF. He will need a cath   2.   Newly diagnosed CHF:   if is 35%  .  This is reduced from his echo last year   which showed EF 50%.   He reports no cocaine use in 10 years He has been taking his BP meds.   Will schedule him for R and L heart cath today   I have discussed risks, benefits, options. He understands and agrees to proceed     Risk Assessment/Risk Scores:  {      For questions or updates, please contact [facility] Please consult [email or web address] for contact info under    Signed, Milani Lowenstein, MD  10/30/2020 9:00 AM  

## 2020-10-31 ENCOUNTER — Inpatient Hospital Stay (HOSPITAL_COMMUNITY): Payer: No Typology Code available for payment source

## 2020-10-31 DIAGNOSIS — R609 Edema, unspecified: Secondary | ICD-10-CM

## 2020-10-31 DIAGNOSIS — I5021 Acute systolic (congestive) heart failure: Secondary | ICD-10-CM

## 2020-10-31 LAB — LACTIC ACID, PLASMA
Lactic Acid, Venous: 0.7 mmol/L (ref 0.5–1.9)
Lactic Acid, Venous: 1 mmol/L (ref 0.5–1.9)

## 2020-10-31 LAB — BLOOD GAS, ARTERIAL
Acid-Base Excess: 3.5 mmol/L — ABNORMAL HIGH (ref 0.0–2.0)
Bicarbonate: 28.3 mmol/L — ABNORMAL HIGH (ref 20.0–28.0)
Drawn by: 24487
FIO2: 44
O2 Saturation: 91.8 %
Patient temperature: 37
pCO2 arterial: 50.2 mmHg — ABNORMAL HIGH (ref 32.0–48.0)
pH, Arterial: 7.371 (ref 7.350–7.450)
pO2, Arterial: 64.2 mmHg — ABNORMAL LOW (ref 83.0–108.0)

## 2020-10-31 LAB — BASIC METABOLIC PANEL
Anion gap: 4 — ABNORMAL LOW (ref 5–15)
Anion gap: 4 — ABNORMAL LOW (ref 5–15)
BUN: 18 mg/dL (ref 8–23)
BUN: 22 mg/dL (ref 8–23)
CO2: 31 mmol/L (ref 22–32)
CO2: 31 mmol/L (ref 22–32)
Calcium: 8.7 mg/dL — ABNORMAL LOW (ref 8.9–10.3)
Calcium: 8.8 mg/dL — ABNORMAL LOW (ref 8.9–10.3)
Chloride: 101 mmol/L (ref 98–111)
Chloride: 106 mmol/L (ref 98–111)
Creatinine, Ser: 1.27 mg/dL — ABNORMAL HIGH (ref 0.61–1.24)
Creatinine, Ser: 1.46 mg/dL — ABNORMAL HIGH (ref 0.61–1.24)
GFR, Estimated: 52 mL/min — ABNORMAL LOW (ref >60)
GFR, Estimated: >60 mL/min (ref >60)
Glucose, Bld: 136 mg/dL — ABNORMAL HIGH (ref 70–99)
Glucose, Bld: 152 mg/dL — ABNORMAL HIGH (ref 70–99)
Potassium: 3.6 mmol/L (ref 3.5–5.1)
Potassium: 3.8 mmol/L (ref 3.5–5.1)
Sodium: 136 mmol/L (ref 135–145)
Sodium: 141 mmol/L (ref 135–145)

## 2020-10-31 LAB — CBC
HCT: 32.7 % — ABNORMAL LOW (ref 39.0–52.0)
Hemoglobin: 10.5 g/dL — ABNORMAL LOW (ref 13.0–17.0)
MCH: 29.2 pg (ref 26.0–34.0)
MCHC: 32.1 g/dL (ref 30.0–36.0)
MCV: 90.8 fL (ref 80.0–100.0)
Platelets: 233 10*3/uL (ref 150–400)
RBC: 3.6 MIL/uL — ABNORMAL LOW (ref 4.22–5.81)
RDW: 13 % (ref 11.5–15.5)
WBC: 3.7 10*3/uL — ABNORMAL LOW (ref 4.0–10.5)
nRBC: 0 % (ref 0.0–0.2)

## 2020-10-31 LAB — BRAIN NATRIURETIC PEPTIDE: B Natriuretic Peptide: 24.1 pg/mL (ref 0.0–100.0)

## 2020-10-31 LAB — TROPONIN I (HIGH SENSITIVITY): Troponin I (High Sensitivity): 15 ng/L (ref <18)

## 2020-10-31 LAB — D-DIMER, QUANTITATIVE: D-Dimer, Quant: 0.55 ug/mL-FEU — ABNORMAL HIGH (ref 0.00–0.50)

## 2020-10-31 MED ORDER — SODIUM CHLORIDE 0.9 % IV BOLUS
2000.0000 mL | Freq: Once | INTRAVENOUS | Status: AC
  Administered 2020-10-31: 2000 mL via INTRAVENOUS

## 2020-10-31 MED ORDER — METOPROLOL TARTRATE 12.5 MG HALF TABLET
12.5000 mg | ORAL_TABLET | Freq: Two times a day (BID) | ORAL | Status: DC
  Administered 2020-10-31 (×2): 12.5 mg via ORAL
  Filled 2020-10-31 (×2): qty 1

## 2020-10-31 NOTE — Progress Notes (Signed)
Progress Note  Patient Name: Gerald Williams Date of Encounter: 10/31/2020  Primary Cardiologist: Chrystie Nose, MD   Subjective   No obstructive CAD seen on LHC.  Patient had low BP overnight. Presently recovered.  Inpatient Medications    Scheduled Meds:  aspirin EC  81 mg Oral Daily   atorvastatin  80 mg Oral Daily   enoxaparin (LOVENOX) injection  40 mg Subcutaneous Q24H   ipratropium-albuterol  3 mL Inhalation BID   mirtazapine  30 mg Oral QHS   mometasone-formoterol  2 puff Inhalation BID   sodium chloride flush  3 mL Intravenous Q12H   sodium chloride flush  3 mL Intravenous Q12H   Continuous Infusions:  sodium chloride     sodium chloride     sodium chloride     PRN Meds: sodium chloride, sodium chloride, acetaminophen, albuterol, ipratropium, ondansetron (ZOFRAN) IV, sodium chloride, sodium chloride flush, sodium chloride flush, traMADol   Vital Signs    Vitals:   10/31/20 0315 10/31/20 0700 10/31/20 0733 10/31/20 0800  BP: 109/73 105/75  115/85  Pulse: 70 91  67  Resp: (!) 22 17  16   Temp:  97.8 F (36.6 C)    TempSrc:  Oral    SpO2: 99% 98% 99% 92%  Weight:      Height:        Intake/Output Summary (Last 24 hours) at 10/31/2020 1054 Last data filed at 10/31/2020 0800 Gross per 24 hour  Intake 240 ml  Output 1175 ml  Net -935 ml   Filed Weights   10/29/20 1748 10/31/20 0300  Weight: 79 kg 80.8 kg    Telemetry    SR- presently off of telemetry - Personally Reviewed  ECG    SR 63 borderline LHV with secondary repol - Personally Reviewed  Physical Exam   GEN: No acute distress.   Neck: No JVD Cardiac: RRR, no murmurs, rubs, or gallops. R Radial site c/d/I without hematoma or bruit Respiratory: Significant bilateral expiratory wheezes GI: Soft, nontender, non-distended  MS: No edema; No deformity. Neuro:  Nonfocal  Psych: Normal affect   Labs    Chemistry Recent Labs  Lab 10/29/20 0506 10/29/20 1250 10/30/20 1542 10/31/20 0006   NA 141  --  138  138 136  K 4.6  --  4.1  4.1 3.8  CL 103  --   --  101  CO2 32  --   --  31  GLUCOSE 111*  --   --  152*  BUN 17  --   --  22  CREATININE 1.16 1.19  --  1.46*  CALCIUM 9.2  --   --  8.8*  PROT 7.3  --   --   --   ALBUMIN 3.7  --   --   --   AST 20  --   --   --   ALT 16  --   --   --   ALKPHOS 74  --   --   --   BILITOT 0.6  --   --   --   GFRNONAA >60 >60  --  52*  ANIONGAP 6  --   --  4*     Hematology Recent Labs  Lab 10/29/20 0506 10/29/20 1250 10/30/20 1542 10/31/20 0006  WBC 5.1 4.7  --  3.7*  RBC 4.01* 4.17*  --  3.60*  HGB 11.8* 12.5* 12.2*  11.9* 10.5*  HCT 37.0* 38.5* 36.0*  35.0* 32.7*  MCV 92.3  92.3  --  90.8  MCH 29.4 30.0  --  29.2  MCHC 31.9 32.5  --  32.1  RDW 13.4 13.3  --  13.0  PLT 243 256  --  233    Cardiac EnzymesNo results for input(s): TROPONINI in the last 168 hours. No results for input(s): TROPIPOC in the last 168 hours.   BNP Recent Labs  Lab 10/29/20 0506 10/31/20 0006  BNP 38.9 24.1     DDimer  Recent Labs  Lab 10/31/20 0246  DDIMER 0.55*     Radiology    CARDIAC CATHETERIZATION  Result Date: 10/30/2020  There is mild to moderate left ventricular systolic dysfunction.  The left ventricular ejection fraction is 45-50% by visual estimate.  LV end diastolic pressure is moderately elevated.  1.  Normal coronary arteries. 2.  Mildly to moderately reduced LV systolic function with an EF of 40% with global hypokinesis. 3.  Right heart catheterization showed moderately elevated filling pressures, moderate pulmonary hypertension and mildly reduced cardiac output.  Interpretation of right heart numbers was somewhat difficult due to severe respiratory variations and patient's difficulty with breath-hold. Recommendations: The patient has nonischemic cardiomyopathy and he appears to be volume overloaded. Recommend medical therapy for heart failure and cardiomyopathy. I added furosemide 20 mg intravenously twice daily  for volume overload. Consider switching lisinopril to Entresto in the near future and adding spironolactone. Discontinue prazosin if the only indication for use is hypertension.   DG Chest Port 1 View  Result Date: 10/30/2020 CLINICAL DATA:  Acute respiratory distress EXAM: PORTABLE CHEST 1 VIEW COMPARISON:  10/29/2020 FINDINGS: Cardiac shadow is stable. Aortic calcifications are again seen and stable. Lungs are well aerated bilaterally. Areas of focal scarring are again seen bilaterally in the mid lungs. No new focal infiltrate or effusion is seen. No bony abnormality is noted. IMPRESSION: Bilateral scarring stable from the prior exam. No acute abnormality noted. Electronically Signed   By: Alcide Clever M.D.   On: 10/30/2020 23:57   ECHOCARDIOGRAM COMPLETE  Result Date: 10/29/2020    ECHOCARDIOGRAM REPORT   Patient Name:   Gerald Williams Date of Exam: 10/29/2020 Medical Rec #:  [id number]      Height:       65.5 in Accession #:    [id number]     Weight:       174.8 lb Date of Birth:  10/22/1950      BSA:          1.879 m Patient Age:    69 years       BP:           138/88 mmHg Patient Gender: M              HR:           81 bpm. Exam Location:  Inpatient Procedure: 2D Echo, Cardiac Doppler and Color Doppler Indications:    Chest Pain R07.9  History:        Patient has prior history of Echocardiogram examinations, most                 recent 05/07/2019. Risk Factors:Sleep Apnea, Hypertension,                 Dyslipidemia and Former Smoker. Polysubstance abuse.  Sonographer:    Renella Cunas RDCS Referring Phys: [id number] Teddy Spike  Sonographer Comments: Image acquisition challenging due to patient body habitus and Image acquisition challenging due to respiratory motion. IMPRESSIONS  1. Left ventricular  ejection fraction, by estimation, is 35%. The left ventricle has moderately decreased function. The left ventricle demonstrates global hypokinesis. Left ventricular diastolic parameters are consistent with Grade I  diastolic dysfunction (impaired relaxation).  2. Right ventricular systolic function is mildly reduced. The right ventricular size is normal. Tricuspid regurgitation signal is inadequate for assessing PA pressure.  3. The mitral valve is normal in structure. No evidence of mitral valve regurgitation. No evidence of mitral stenosis.  4. The aortic valve was not well visualized. Aortic valve regurgitation is not visualized. No aortic stenosis is present.  5. Aortic dilatation noted. There is mild dilatation of the aortic root, measuring 37 mm.  6. The inferior vena cava is normal in size with greater than 50% respiratory variability, suggesting right atrial pressure of 3 mmHg.  7. Technically difficult study with poor acoustic windows. FINDINGS  Left Ventricle: Left ventricular ejection fraction, by estimation, is 35%. The left ventricle has moderately decreased function. The left ventricle demonstrates global hypokinesis. The left ventricular internal cavity size was normal in size. There is no left ventricular hypertrophy. Left ventricular diastolic parameters are consistent with Grade I diastolic dysfunction (impaired relaxation). Right Ventricle: The right ventricular size is normal. No increase in right ventricular wall thickness. Right ventricular systolic function is mildly reduced. Tricuspid regurgitation signal is inadequate for assessing PA pressure. Left Atrium: Left atrial size was normal in size. Right Atrium: Right atrial size was normal in size. Pericardium: There is no evidence of pericardial effusion. Mitral Valve: The mitral valve is normal in structure. No evidence of mitral valve regurgitation. No evidence of mitral valve stenosis. Tricuspid Valve: The tricuspid valve is not well visualized. Tricuspid valve regurgitation is not demonstrated. Aortic Valve: The aortic valve was not well visualized. Aortic valve regurgitation is not visualized. No aortic stenosis is present. Pulmonic Valve: The  pulmonic valve was not well visualized. Pulmonic valve regurgitation is not visualized. Aorta: Aortic dilatation noted. There is mild dilatation of the aortic root, measuring 37 mm. Venous: The inferior vena cava is normal in size with greater than 50% respiratory variability, suggesting right atrial pressure of 3 mmHg. IAS/Shunts: No atrial level shunt detected by color flow Doppler.  LEFT VENTRICLE PLAX 2D LVIDd:         4.70 cm     Diastology LVIDs:         3.90 cm     LV e' medial:    6.08 cm/s LV PW:         0.80 cm     LV E/e' medial:  6.2 LV IVS:        0.80 cm     LV e' lateral:   7.10 cm/s LVOT diam:     2.30 cm     LV E/e' lateral: 5.3 LV SV:         62 LV SV Index:   33 LVOT Area:     4.15 cm  LV Volumes (MOD) LV vol d, MOD A4C: 89.4 ml LV vol s, MOD A4C: 51.3 ml LV SV MOD A4C:     89.4 ml RIGHT VENTRICLE RV S prime:     9.11 cm/s TAPSE (M-mode): 1.9 cm LEFT ATRIUM           Index       RIGHT ATRIUM          Index LA diam:      2.90 cm 1.54 cm/m  RA Area:     9.23 cm LA Vol (A2C):  19.4 ml 10.33 ml/m RA Volume:   19.50 ml 10.38 ml/m LA Vol (A4C): 19.7 ml 10.49 ml/m  AORTIC VALVE LVOT Vmax:   85.50 cm/s LVOT Vmean:  60.500 cm/s LVOT VTI:    0.149 m  AORTA Ao Root diam: 3.70 cm MITRAL VALVE MV Area (PHT): 3.83 cm    SHUNTS MV Decel Time: 198 msec    Systemic VTI:  0.15 m MV E velocity: 37.70 cm/s  Systemic Diam: 2.30 cm MV A velocity: 67.70 cm/s MV E/A ratio:  0.56 Marca Ancona MD Electronically signed by Marca Ancona MD Signature Date/Time: 10/29/2020/5:03:27 PM    Final      Patient Profile     70 y.o. male NICM and COPD on 5 L at baseline who presents wth atypical CP; elevated LVEDPl; and concern for inatrogenic hypotension  Assessment & Plan   NICM  Heart Failure  Ejection Fraction  COPD AKI- may be hypotension mediated - NYHA class I, Stage C, slightly hypervolemic, etiology unclear - HIV negative, TSH pending, no significant hypertrophy to suggestive infiltrative disease, no other  autoimmune sequalae, no prior chemo - outpatient CMR could be considered - given hypotensive episode, agree with conservative uptitration of GDMT - will attempt low dose BB 12.5 mg PO tartrate today with goal at DC of returning to succinate (ordered) - will attempt to start maintenance lasix as next medication vs SGLT2i (based on kidney and creatinine) - would not plan to return lisinopril; if BP tolerates we would start low dose losartan as a bridge to outpatient entresto - Patient may be verociguat candidate (2.5 mg) as outpatient  Will continue to follow   For questions or updates, please contact [facility] Please consult [email or web address] for contact info under Cardiology/STEMI.      Signed, Christell Constant, MD  10/31/2020, 10:54 AM

## 2020-10-31 NOTE — Plan of Care (Signed)

## 2020-10-31 NOTE — Progress Notes (Addendum)
PROGRESS NOTE    Gerald Williams  XBJ:[id number] DOB: [birth year] DOA: 10/29/2020 [facility]    Brief Narrative:  70/M with history of advanced COPD on 5 L home O2, hypertension, dyslipidemia presented to the ED with 2-day history of left-sided chest pain. Troponins negative.  EKG is nonischemic.  Chest pain improved with aspirin.  Echocardiogram noted EF of 35% global hypokinesis, diastolic dysfunction. -Left heart cath 7/8 evening noted normal coronaries, elevated filling pressures, EF of 40% -Overnight became profoundly hypotensive requiring 3 L fluid bolus  Assessment & Plan:  Atypical chest pain -Ruled out for ACS, chest pain has resolved -EKG nonischemic, high-sensitivity troponins are negative -Left heart cath with normal coronaries  Nonischemic cardiomyopathy Acute systolic CHF -Clinically appears euvolemic this morning, filling pressures were high on Yesterday evening, received IV Lasix, diuretics on hold now -Cardiology following -Restart low-dose beta-blocker  Hypotension -Overnight following cath -I suspect this is secondary to medications, (got Lasix, prazosin, lisinopril and beta-blocker yesterday) and sedation.  BP improved now -Clinically do not suspect acute PE -Monitor clinically, antihypertensives on hold -Slowly resume low-dose beta-blocker  AKI -due to hypotension, hold lisinopril -monitor  COPD/chronic respiratory failure -Quit smoking 10 years ago, stable, continue Combivent and Symbicort  DVT prophylaxis: enoxaparin (LOVENOX) injection 40 mg Start: 10/29/20 1800   Code Status: Full code Family Communication: None at the bedside Disposition Plan: Status is: Observation  The patient remains OBS appropriate and will d/c before 2 midnights.  Dispo: The patient is from: Home              Anticipated d/c is to: Home              Patient currently is not medically stable to d/c.   Difficult to place patient No   Consultants:   Cardiology  Procedures:  Cardiac cath : There is mild to moderate left ventricular systolic dysfunction. The left ventricular ejection fraction is 45-50% by visual estimate. LV end diastolic pressure is moderately elevated.   1.  Normal coronary arteries. 2.  Mildly to moderately reduced LV systolic function with an EF of 40% with global hypokinesis. 3.  Right heart catheterization showed moderately elevated filling pressures, moderate pulmonary hypertension and mildly reduced cardiac output.  Interpretation of right heart numbers was somewhat difficult due to severe respiratory variations and patient's difficulty with breath-hold.   Recommendations: The patient has nonischemic cardiomyopathy and he appears to be volume overloaded. Recommend medical therapy for heart failure and cardiomyopathy. I added furosemide 20 mg intravenously twice daily for volume overload. Consider switching lisinopril to Entresto in the near future and adding spironolactone. Discontinue prazosin if the only indication for use is hypertension.  Antimicrobials:  None   Subjective: -Feels okay this morning, overnight with severe hypertension and dyspnea requiring 3 L fluid bolus -Denies any chest pain, dyspnea is at baseline from his COPD  Objective: Vitals:   10/31/20 0315 10/31/20 0700 10/31/20 0733 10/31/20 0800  BP: 109/73 105/75  115/85  Pulse: 70 91  67  Resp: (!) 22 17  16   Temp:  97.8 F (36.6 C)    TempSrc:  Oral    SpO2: 99% 98% 99% 92%  Weight:      Height:        Intake/Output Summary (Last 24 hours) at 10/31/2020 1011 Last data filed at 10/31/2020 0800 Gross per 24 hour  Intake 240 ml  Output 1175 ml  Net -935 ml   Filed Weights   10/29/20 1748  10/31/20 0300  Weight: 79 kg 80.8 kg    Examination:  General exam: Middle-aged pleasant male sitting up in bed, AAOx3, no distress on 5 L home O2 HEENT: No JVD CVS: S1-S2, regular rate rhythm Lungs: Poor air movement bilaterally, no  wheezes or rhonchi Abdomen: Soft, nontender, bowel sounds present Extremities: No edema  Skin: No rashes, lesions or ulcers Psychiatry: Mood & affect appropriate.     Data Reviewed: I have personally reviewed following labs and imaging studies  CBC: Recent Labs  Lab 10/29/20 0506 10/29/20 1250 10/30/20 1542 10/31/20 0006  WBC 5.1 4.7  --  3.7*  NEUTROABS 3.1  --   --   --   HGB 11.8* 12.5* 12.2*  11.9* 10.5*  HCT 37.0* 38.5* 36.0*  35.0* 32.7*  MCV 92.3 92.3  --  90.8  PLT 243 256  --  233   Basic Metabolic Panel: Recent Labs  Lab 10/29/20 0506 10/29/20 1250 10/30/20 1542 10/31/20 0006  NA 141  --  138  138 136  K 4.6  --  4.1  4.1 3.8  CL 103  --   --  101  CO2 32  --   --  31  GLUCOSE 111*  --   --  152*  BUN 17  --   --  22  CREATININE 1.16 1.19  --  1.46*  CALCIUM 9.2  --   --  8.8*   GFR: Estimated Creatinine Clearance: 47.9 mL/min (A) (by C-G formula based on SCr of 1.46 mg/dL (H)). Liver Function Tests: Recent Labs  Lab 10/29/20 0506  AST 20  ALT 16  ALKPHOS 74  BILITOT 0.6  PROT 7.3  ALBUMIN 3.7   No results for input(s): LIPASE, AMYLASE in the last 168 hours. No results for input(s): AMMONIA in the last 168 hours. Coagulation Profile: No results for input(s): INR, PROTIME in the last 168 hours. Cardiac Enzymes: No results for input(s): CKTOTAL, CKMB, CKMBINDEX, TROPONINI in the last 168 hours. BNP (last 3 results) No results for input(s): PROBNP in the last 8760 hours. HbA1C: No results for input(s): HGBA1C in the last 72 hours. CBG: Recent Labs  Lab 10/30/20 2342  GLUCAP 145*   Lipid Profile: Recent Labs    10/29/20 1250  CHOL 244*  HDL 79  LDLCALC 147*  TRIG 92  CHOLHDL 3.1   Thyroid Function Tests: No results for input(s): TSH, T4TOTAL, FREET4, T3FREE, THYROIDAB in the last 72 hours. Anemia Panel: No results for input(s): VITAMINB12, FOLATE, FERRITIN, TIBC, IRON, RETICCTPCT in the last 72 hours. Sepsis Labs: Recent  Labs  Lab 10/31/20 0006 10/31/20 0246  LATICACIDVEN 1.0 0.7    Recent Results (from the past 240 hour(s))  SARS CORONAVIRUS 2 (TAT 6-24 HRS) Nasopharyngeal Nasopharyngeal Swab     Status: None   Collection Time: 10/29/20  3:07 PM   Specimen: Nasopharyngeal Swab  Result Value Ref Range Status   SARS Coronavirus 2 NEGATIVE NEGATIVE Final    Comment: (NOTE) SARS-CoV-2 target nucleic acids are NOT DETECTED.  The SARS-CoV-2 RNA is generally detectable in upper and lower respiratory specimens during the acute phase of infection. Negative results do not preclude SARS-CoV-2 infection, do not rule out co-infections with other pathogens, and should not be used as the sole basis for treatment or other patient management decisions. Negative results must be combined with clinical observations, patient history, and epidemiological information. The expected result is Negative.  Fact Sheet for Patients: HairSlick.no  Fact Sheet for Healthcare Providers: quierodirigir.com  This test is not yet approved or cleared by the [place] and  has been authorized for detection and/or diagnosis of SARS-CoV-2 by FDA under an Emergency Use Authorization (EUA). This EUA will remain  in effect (meaning this test can be used) for the duration of the COVID-19 declaration under Se ction 564(b)(1) of the Act, 21 U.S.C. section 360bbb-3(b)(1), unless the authorization is terminated or revoked sooner.  Performed at [facility], [address] [place], [place] [address]          Radiology Studies: CARDIAC CATHETERIZATION  Result Date: 10/30/2020  There is mild to moderate left ventricular systolic dysfunction.  The left ventricular ejection fraction is 45-50% by visual estimate.  LV end diastolic pressure is moderately elevated.  1.  Normal coronary arteries. 2.  Mildly to moderately reduced LV systolic function with an EF of 40%  with global hypokinesis. 3.  Right heart catheterization showed moderately elevated filling pressures, moderate pulmonary hypertension and mildly reduced cardiac output.  Interpretation of right heart numbers was somewhat difficult due to severe respiratory variations and patient's difficulty with breath-hold. Recommendations: The patient has nonischemic cardiomyopathy and he appears to be volume overloaded. Recommend medical therapy for heart failure and cardiomyopathy. I added furosemide 20 mg intravenously twice daily for volume overload. Consider switching lisinopril to Entresto in the near future and adding spironolactone. Discontinue prazosin if the only indication for use is hypertension.   DG Chest Port 1 View  Result Date: 10/30/2020 CLINICAL DATA:  Acute respiratory distress EXAM: PORTABLE CHEST 1 VIEW COMPARISON:  10/29/2020 FINDINGS: Cardiac shadow is stable. Aortic calcifications are again seen and stable. Lungs are well aerated bilaterally. Areas of focal scarring are again seen bilaterally in the mid lungs. No new focal infiltrate or effusion is seen. No bony abnormality is noted. IMPRESSION: Bilateral scarring stable from the prior exam. No acute abnormality noted. Electronically Signed   By: Alcide Clever M.D.   On: 10/30/2020 23:57   ECHOCARDIOGRAM COMPLETE  Result Date: 10/29/2020    ECHOCARDIOGRAM REPORT   Patient Name:   ADEM COSTLOW Date of Exam: 10/29/2020 Medical Rec #:  [id number]      Height:       65.5 in Accession #:    [id number]     Weight:       174.8 lb Date of Birth:  [birth year]      BSA:          1.879 m Patient Age:    69 years       BP:           138/88 mmHg Patient Gender: M              HR:           81 bpm. Exam Location:  Inpatient Procedure: 2D Echo, Cardiac Doppler and Color Doppler Indications:    Chest Pain R07.9  History:        Patient has prior history of Echocardiogram examinations, most                 recent 05/07/2019. Risk Factors:Sleep Apnea, Hypertension,                  Dyslipidemia and Former Smoker. Polysubstance abuse.  Sonographer:    Renella Cunas RDCS Referring Phys: [id number] Teddy Spike  Sonographer Comments: Image acquisition challenging due to patient body habitus and Image acquisition challenging due to respiratory motion. IMPRESSIONS  1. Left ventricular ejection fraction,  by estimation, is 35%. The left ventricle has moderately decreased function. The left ventricle demonstrates global hypokinesis. Left ventricular diastolic parameters are consistent with Grade I diastolic dysfunction (impaired relaxation).  2. Right ventricular systolic function is mildly reduced. The right ventricular size is normal. Tricuspid regurgitation signal is inadequate for assessing PA pressure.  3. The mitral valve is normal in structure. No evidence of mitral valve regurgitation. No evidence of mitral stenosis.  4. The aortic valve was not well visualized. Aortic valve regurgitation is not visualized. No aortic stenosis is present.  5. Aortic dilatation noted. There is mild dilatation of the aortic root, measuring 37 mm.  6. The inferior vena cava is normal in size with greater than 50% respiratory variability, suggesting right atrial pressure of 3 mmHg.  7. Technically difficult study with poor acoustic windows. FINDINGS  Left Ventricle: Left ventricular ejection fraction, by estimation, is 35%. The left ventricle has moderately decreased function. The left ventricle demonstrates global hypokinesis. The left ventricular internal cavity size was normal in size. There is no left ventricular hypertrophy. Left ventricular diastolic parameters are consistent with Grade I diastolic dysfunction (impaired relaxation). Right Ventricle: The right ventricular size is normal. No increase in right ventricular wall thickness. Right ventricular systolic function is mildly reduced. Tricuspid regurgitation signal is inadequate for assessing PA pressure. Left Atrium: Left atrial size was normal in  size. Right Atrium: Right atrial size was normal in size. Pericardium: There is no evidence of pericardial effusion. Mitral Valve: The mitral valve is normal in structure. No evidence of mitral valve regurgitation. No evidence of mitral valve stenosis. Tricuspid Valve: The tricuspid valve is not well visualized. Tricuspid valve regurgitation is not demonstrated. Aortic Valve: The aortic valve was not well visualized. Aortic valve regurgitation is not visualized. No aortic stenosis is present. Pulmonic Valve: The pulmonic valve was not well visualized. Pulmonic valve regurgitation is not visualized. Aorta: Aortic dilatation noted. There is mild dilatation of the aortic root, measuring 37 mm. Venous: The inferior vena cava is normal in size with greater than 50% respiratory variability, suggesting right atrial pressure of 3 mmHg. IAS/Shunts: No atrial level shunt detected by color flow Doppler.  LEFT VENTRICLE PLAX 2D LVIDd:         4.70 cm     Diastology LVIDs:         3.90 cm     LV e' medial:    6.08 cm/s LV PW:         0.80 cm     LV E/e' medial:  6.2 LV IVS:        0.80 cm     LV e' lateral:   7.10 cm/s LVOT diam:     2.30 cm     LV E/e' lateral: 5.3 LV SV:         62 LV SV Index:   33 LVOT Area:     4.15 cm  LV Volumes (MOD) LV vol d, MOD A4C: 89.4 ml LV vol s, MOD A4C: 51.3 ml LV SV MOD A4C:     89.4 ml RIGHT VENTRICLE RV S prime:     9.11 cm/s TAPSE (M-mode): 1.9 cm LEFT ATRIUM           Index       RIGHT ATRIUM          Index LA diam:      2.90 cm 1.54 cm/m  RA Area:     9.23 cm LA Vol (A2C): 19.4 ml 10.33  ml/m RA Volume:   19.50 ml 10.38 ml/m LA Vol (A4C): 19.7 ml 10.49 ml/m  AORTIC VALVE LVOT Vmax:   85.50 cm/s LVOT Vmean:  60.500 cm/s LVOT VTI:    0.149 m  AORTA Ao Root diam: 3.70 cm MITRAL VALVE MV Area (PHT): 3.83 cm    SHUNTS MV Decel Time: 198 msec    Systemic VTI:  0.15 m MV E velocity: 37.70 cm/s  Systemic Diam: 2.30 cm MV A velocity: 67.70 cm/s MV E/A ratio:  0.56 Marca Ancona MD  Electronically signed by Marca Ancona MD Signature Date/Time: 10/29/2020/5:03:27 PM    Final         Scheduled Meds:  aspirin EC  81 mg Oral Daily   atorvastatin  80 mg Oral Daily   enoxaparin (LOVENOX) injection  40 mg Subcutaneous Q24H   ipratropium-albuterol  3 mL Inhalation BID   mirtazapine  30 mg Oral QHS   mometasone-formoterol  2 puff Inhalation BID   sodium chloride flush  3 mL Intravenous Q12H   sodium chloride flush  3 mL Intravenous Q12H   Continuous Infusions:  sodium chloride     sodium chloride     sodium chloride       LOS: 1 day    Time spent: 35 minutes    Zannie Cove, MD [facility]

## 2020-10-31 NOTE — Plan of Care (Signed)
?  Problem: Education: ?Goal: Knowledge of General Education information will improve ?Description: Including pain rating scale, medication(s)/side effects and non-pharmacologic comfort measures ?Outcome: Progressing ?  ?Problem: Clinical Measurements: ?Goal: Respiratory complications will improve ?Outcome: Progressing ?Goal: Cardiovascular complication will be avoided ?Outcome: Progressing ?  ?Problem: Pain Managment: ?Goal: General experience of comfort will improve ?Outcome: Progressing ?  ?

## 2020-10-31 NOTE — Significant Event (Addendum)
Rapid Response Event Note   Reason for Call :  SOB, hypotension-55/34  Initial Focused Assessment:  Pt lying in bed with eyes open. He is alert to self. He denies chest pain but does c/o SOB. Pt very lethargic and goes to sleep very easily. Pt breathing is tachypneic/labored. He is using abdominal muscles to breathe.  Lungs clear and diminished. Skin warm and dry.   T-97.7, HR-64, BP-55/34, RR-31, SpO2-89% on 5L [place]  During assessment, pt began to c/o R sided chest pain 5/10. Another EKG was obtained showing the same as the previous one.   Interventions:  NRB-SpO2 up to 100% EKG x 2-Sinus rhythm with marked sinus arrhythmia T wave abnormality, consider anterolateral ischemia 500cc NS bolus x 1, additional 500cc NS bolus on MD arrival PCXR-Bilateral scarring stable from prior exam. No acute abnormality noted.  CBG-145 ABG-7.37/50.2/64.2/28.3 CBC/BMP/BNP/LA/Trop Plan of Care: SBP up to 90s. Pt still lethargic. Await lab values and relay to MD. Continue to monitor closely. Call RRT if further assistance needed.   Event Summary:   MD Notified: Dr. Toniann Fail notified and to bedside Call Time:2328 Arrival Time:2328 End Time:0050   Update: 0006>Pt labs WNL. SBP down to 70s after bolus. Pt still lethargic. 2L bolus ordered and STAT CT head.     Terrilyn Saver, RN

## 2020-10-31 NOTE — Progress Notes (Signed)
Tech reported to Clinical research associate that patient reports he is having trouble breathing. Writer enters patient room with Rosalita Chessman, RN, patient is found in bed lethargic and repeating "I can't breath" weakly over and over. Blood pressure taken and noted to be 60/48. Immediate repeat BP 55/34. Patient was lethargic and having trouble staying awake. Charge nurse was notified and presented to bedside. Rapid nurse Mindy was called and immediately presented to bedside. 500 fluid bolus was given. Patient's blood pressure returned to 80-90s systolic. Patient responds to voice, continues to state he can't breath. Nonrebreather applied at 15 L. Patient is asked orientation questions and found to be oriented only to self. Physician was paged at 2342 and is at bedside quickly. Labs were drawn, CXR was obtained, and blood pressure remained in 90s systolic. Patient does admit to chest pain, pointing to the center of his check. An EKG was obtained. Another 500 bolus was given. By 2353 patient is more responsive; however, still very lethargic. Continue to monitor.     10/30/20 2324  Assess: MEWS Score  BP (!) 60/48  Pulse Rate 65  ECG Heart Rate 70  Resp (!) 27  SpO2 (!) 88 %  O2 Device Nasal Cannula  Patient Activity (if Appropriate) In bed  O2 Flow Rate (L/min) 5 L/min  Assess: MEWS Score  MEWS Temp 0  MEWS Systolic 3  MEWS Pulse 0  MEWS RR 2  MEWS LOC 0  MEWS Score 5  MEWS Score Color Red  Assess: if the MEWS score is Yellow or Red  Were vital signs taken at a resting state? Yes  Focused Assessment Change from prior assessment (see assessment flowsheet)  Early Detection of Sepsis Score *See Row Information* Low  MEWS guidelines implemented *See Row Information* Yes  Treat  Pain Scale 0-10  Pain Score 0  Take Vital Signs  Increase Vital Sign Frequency  Red: Q 1hr X 4 then Q 4hr X 4, if remains red, continue Q 4hrs  Escalate  MEWS: Escalate Red: discuss with charge nurse/RN and provider, consider discussing with  RRT  Notify: Charge Nurse/RN  Name of Charge Nurse/RN Notified marissa  Date Charge Nurse/RN Notified 10/30/20  Time Charge Nurse/RN Notified 2325  Notify: Provider  Provider Name/Title dr Toniann Fail  Date Provider Notified 10/31/20  Time Provider Notified 2342  Notification Type Page  Notification Reason Change in status  Provider response At bedside  Date of Provider Response 10/31/20  Time of Provider Response 2350  Notify: Rapid Response  Name of Rapid Response RN Notified mindy  Date Rapid Response Notified 10/31/20  Time Rapid Response Notified 2327  Assess: SIRS CRITERIA  SIRS Temperature  0  SIRS Pulse 0  SIRS Respirations  1  SIRS WBC 0  SIRS Score Sum  1

## 2020-10-31 NOTE — Significant Event (Signed)
Patient last night night (October 30, 2020) around 11:30 PM became hypotensive hypoxic requiring nonrebreather and lethargic.  Patient was able to be soon weaned off to 6 L oxygen.  On exam at bedside patient was able to answer questions complain of some chest pain.  Chest x-ray, EKG showed some T wave changes in anterior leads.  I ordered troponin CBC BMP lactic acid metabolic panel.  Most of them unremarkable.  ABG showed mild PCO2 elevation around 50.  Since patient was hypotensive with systolic in the 50s total of 3 L fluids were given following which patient blood pressure improved and patient mentation improved.  At this time we will continue to monitor and holding all antihypertensives.  D-dimer is pending.  Midge Minium

## 2020-10-31 NOTE — Progress Notes (Signed)
Lower extremity venous has been completed.   Preliminary results in CV Proc.   Blanch Media 10/31/2020 1:34 PM

## 2020-11-01 DIAGNOSIS — J449 Chronic obstructive pulmonary disease, unspecified: Secondary | ICD-10-CM

## 2020-11-01 DIAGNOSIS — N179 Acute kidney failure, unspecified: Secondary | ICD-10-CM

## 2020-11-01 LAB — CBC
HCT: 32.3 % — ABNORMAL LOW (ref 39.0–52.0)
Hemoglobin: 10.6 g/dL — ABNORMAL LOW (ref 13.0–17.0)
MCH: 29.7 pg (ref 26.0–34.0)
MCHC: 32.8 g/dL (ref 30.0–36.0)
MCV: 90.5 fL (ref 80.0–100.0)
Platelets: 223 10*3/uL (ref 150–400)
RBC: 3.57 MIL/uL — ABNORMAL LOW (ref 4.22–5.81)
RDW: 13.3 % (ref 11.5–15.5)
WBC: 3.6 10*3/uL — ABNORMAL LOW (ref 4.0–10.5)
nRBC: 0 % (ref 0.0–0.2)

## 2020-11-01 LAB — BASIC METABOLIC PANEL
Anion gap: 4 — ABNORMAL LOW (ref 5–15)
BUN: 17 mg/dL (ref 8–23)
CO2: 29 mmol/L (ref 22–32)
Calcium: 8.6 mg/dL — ABNORMAL LOW (ref 8.9–10.3)
Chloride: 108 mmol/L (ref 98–111)
Creatinine, Ser: 1.28 mg/dL — ABNORMAL HIGH (ref 0.61–1.24)
GFR, Estimated: >60 mL/min (ref >60)
Glucose, Bld: 152 mg/dL — ABNORMAL HIGH (ref 70–99)
Potassium: 3.9 mmol/L (ref 3.5–5.1)
Sodium: 141 mmol/L (ref 135–145)

## 2020-11-01 LAB — TSH: TSH: 0.396 u[IU]/mL (ref 0.350–4.500)

## 2020-11-01 MED ORDER — CALCIUM CARBONATE ANTACID 500 MG PO CHEW: 1.0000 | CHEWABLE_TABLET | Freq: Three times a day (TID) | ORAL | Status: DC | PRN

## 2020-11-01 MED ORDER — METOPROLOL SUCCINATE ER 25 MG PO TB24
25.0000 mg | ORAL_TABLET | Freq: Every day | ORAL | Status: DC
  Administered 2020-11-01 – 2020-11-02 (×2): 25 mg via ORAL
  Filled 2020-11-01 (×2): qty 1

## 2020-11-01 MED ORDER — CALCIUM CARBONATE ANTACID 500 MG PO CHEW
1.0000 | CHEWABLE_TABLET | ORAL | Status: DC | PRN
  Administered 2020-11-01 – 2020-11-02 (×2): 200 mg via ORAL
  Filled 2020-11-01 (×2): qty 1

## 2020-11-01 NOTE — Progress Notes (Signed)
Progress Note  Patient Name: Gerald Williams Date of Encounter: 11/01/2020  Primary Cardiologist: Chrystie Nose, MD   Subjective   No return of hypotension; no events overnight.  Blood pressure has rebounded.  Had [employer] Last night. In interval, LE Duplex performed- no evidence of DVT  Inpatient Medications    Scheduled Meds:  aspirin EC  81 mg Oral Daily   atorvastatin  80 mg Oral Daily   enoxaparin (LOVENOX) injection  40 mg Subcutaneous Q24H   ipratropium-albuterol  3 mL Inhalation BID   metoprolol tartrate  12.5 mg Oral BID   mirtazapine  30 mg Oral QHS   mometasone-formoterol  2 puff Inhalation BID   sodium chloride flush  3 mL Intravenous Q12H   sodium chloride flush  3 mL Intravenous Q12H   Continuous Infusions:  sodium chloride     sodium chloride     sodium chloride     PRN Meds: sodium chloride, sodium chloride, acetaminophen, albuterol, ipratropium, ondansetron (ZOFRAN) IV, sodium chloride, sodium chloride flush, sodium chloride flush, traMADol   Vital Signs    Vitals:   10/31/20 2111 10/31/20 2300 11/01/20 0300 11/01/20 0730  BP:  (!) 142/87 110/62 132/66  Pulse:  68 60 (!) 59  Resp:  (!) 23 20 19   Temp:  98.5 F (36.9 C) 98 F (36.7 C) 98 F (36.7 C)  TempSrc:  Oral Oral Oral  SpO2: 93% 98% 98% 100%  Weight:      Height:        Intake/Output Summary (Last 24 hours) at 11/01/2020 0801 Last data filed at 11/01/2020 0753 Gross per 24 hour  Intake 720 ml  Output 1250 ml  Net -530 ml   Filed Weights   10/29/20 1748 10/31/20 0300  Weight: 79 kg 80.8 kg    Telemetry    SR- NSVT listed is artifact - Personally Reviewed  ECG    SR 63 borderline LHV with secondary repol - Personally Reviewed  Physical Exam   GEN: No acute distress.   Neck: No JVD Cardiac: RRR, no murmurs, rubs, or gallops Respiratory: Significant bilateral expiratory wheezes GI: Soft, nontender, non-distended  MS: No edema; No deformity. Neuro:  Nonfocal   Psych: Normal affect   Labs    Chemistry Recent Labs  Lab 10/29/20 0506 10/29/20 1250 10/31/20 0006 10/31/20 1150 11/01/20 0019  NA 141   < > 136 141 141  K 4.6   < > 3.8 3.6 3.9  CL 103  --  101 106 108  CO2 32  --  31 31 29   GLUCOSE 111*  --  152* 136* 152*  BUN 17  --  22 18 17   CREATININE 1.16   < > 1.46* 1.27* 1.28*  CALCIUM 9.2  --  8.8* 8.7* 8.6*  PROT 7.3  --   --   --   --   ALBUMIN 3.7  --   --   --   --   AST 20  --   --   --   --   ALT 16  --   --   --   --   ALKPHOS 74  --   --   --   --   BILITOT 0.6  --   --   --   --   GFRNONAA >60   < > 52* >60 >60  ANIONGAP 6  --  4* 4* 4*   < > = values in this interval not displayed.  Hematology Recent Labs  Lab 10/29/20 1250 10/30/20 1542 10/31/20 0006 11/01/20 0019  WBC 4.7  --  3.7* 3.6*  RBC 4.17*  --  3.60* 3.57*  HGB 12.5* 12.2*  11.9* 10.5* 10.6*  HCT 38.5* 36.0*  35.0* 32.7* 32.3*  MCV 92.3  --  90.8 90.5  MCH 30.0  --  29.2 29.7  MCHC 32.5  --  32.1 32.8  RDW 13.3  --  13.0 13.3  PLT 256  --  233 223    Cardiac EnzymesNo results for input(s): TROPONINI in the last 168 hours. No results for input(s): TROPIPOC in the last 168 hours.   BNP Recent Labs  Lab 10/29/20 0506 10/31/20 0006  BNP 38.9 24.1     DDimer  Recent Labs  Lab 10/31/20 0246  DDIMER 0.55*     Radiology    CARDIAC CATHETERIZATION  Result Date: 10/30/2020  There is mild to moderate left ventricular systolic dysfunction.  The left ventricular ejection fraction is 45-50% by visual estimate.  LV end diastolic pressure is moderately elevated.  1.  Normal coronary arteries. 2.  Mildly to moderately reduced LV systolic function with an EF of 40% with global hypokinesis. 3.  Right heart catheterization showed moderately elevated filling pressures, moderate pulmonary hypertension and mildly reduced cardiac output.  Interpretation of right heart numbers was somewhat difficult due to severe respiratory variations and  patient's difficulty with breath-hold. Recommendations: The patient has nonischemic cardiomyopathy and he appears to be volume overloaded. Recommend medical therapy for heart failure and cardiomyopathy. I added furosemide 20 mg intravenously twice daily for volume overload. Consider switching lisinopril to Entresto in the near future and adding spironolactone. Discontinue prazosin if the only indication for use is hypertension.   DG Chest Port 1 View  Result Date: 10/30/2020 CLINICAL DATA:  Acute respiratory distress EXAM: PORTABLE CHEST 1 VIEW COMPARISON:  10/29/2020 FINDINGS: Cardiac shadow is stable. Aortic calcifications are again seen and stable. Lungs are well aerated bilaterally. Areas of focal scarring are again seen bilaterally in the mid lungs. No new focal infiltrate or effusion is seen. No bony abnormality is noted. IMPRESSION: Bilateral scarring stable from the prior exam. No acute abnormality noted. Electronically Signed   By: Alcide Clever M.D.   On: 10/30/2020 23:57   VAS [place] LOWER EXTREMITY VENOUS (DVT)  Result Date: 10/31/2020  Lower Venous DVT Study Patient Name:  DEMONE Williams  Date of Exam:   10/31/2020 Medical Rec #: [id number]       Accession #:    [id number] Date of Birth: [birth year]       Patient Gender: M Patient Age:   70Y Exam Location:  [facility] Procedure:      VAS [place] LOWER EXTREMITY VENOUS (DVT) Referring Phys: [id number] PREETHA Hadden --------------------------------------------------------------------------------  Indications: Edema.  Comparison Study: no prior Performing Technologist: [place] Ponder RVS  Examination Guidelines: A complete evaluation includes B-mode imaging, spectral Doppler, color Doppler, and power Doppler as needed of all accessible portions of each vessel. Bilateral testing is considered an integral part of a complete examination. Limited examinations for reoccurring indications may be performed as noted. The reflux portion of the exam is performed with  the patient in reverse Trendelenburg.  +---------+---------------+---------+-----------+----------+--------------+ RIGHT    CompressibilityPhasicitySpontaneityPropertiesThrombus Aging +---------+---------------+---------+-----------+----------+--------------+ CFV      Full           Yes      Yes                                 +---------+---------------+---------+-----------+----------+--------------+  SFJ      Full                                                        +---------+---------------+---------+-----------+----------+--------------+ FV Prox  Full                                                        +---------+---------------+---------+-----------+----------+--------------+ FV Mid   Full                                                        +---------+---------------+---------+-----------+----------+--------------+ FV DistalFull                                                        +---------+---------------+---------+-----------+----------+--------------+ PFV      Full                                                        +---------+---------------+---------+-----------+----------+--------------+ POP      Full           Yes      Yes                                 +---------+---------------+---------+-----------+----------+--------------+ PTV      Full                                                        +---------+---------------+---------+-----------+----------+--------------+ PERO     Full                                                        +---------+---------------+---------+-----------+----------+--------------+   +---------+---------------+---------+-----------+----------+--------------+ LEFT     CompressibilityPhasicitySpontaneityPropertiesThrombus Aging +---------+---------------+---------+-----------+----------+--------------+ CFV      Full           Yes      Yes                                  +---------+---------------+---------+-----------+----------+--------------+ SFJ      Full                                                        +---------+---------------+---------+-----------+----------+--------------+  FV Prox  Full                                                        +---------+---------------+---------+-----------+----------+--------------+ FV Mid   Full                                                        +---------+---------------+---------+-----------+----------+--------------+ FV DistalFull                                                        +---------+---------------+---------+-----------+----------+--------------+ PFV      Full                                                        +---------+---------------+---------+-----------+----------+--------------+ POP      Full           Yes      Yes                                 +---------+---------------+---------+-----------+----------+--------------+ PTV      Full                                                        +---------+---------------+---------+-----------+----------+--------------+ PERO     Full                                                        +---------+---------------+---------+-----------+----------+--------------+     Summary: BILATERAL: - No evidence of deep vein thrombosis seen in the lower extremities, bilaterally. -No evidence of popliteal cyst, bilaterally.   *See table(s) above for measurements and observations. Electronically signed by Heath Lark on 10/31/2020 at 4:47:21 PM.    Final      Patient Profile     70 y.o. male NICM and COPD on 5 L at baseline who presents wth atypical CP; elevated LVEDPl; and concern for inatrogenic hypotension  Assessment & Plan   NICM  Heart Failure  Ejection Fraction  COPD AKI- improving - NYHA class I, Stage C, slightly hypervolemic, etiology unclear - outpatient CMR could be considered - given  hypotensive 10/31/20 Early AM, conservative uptitration of GDMT - transitioning back to metoprolol succinate 25 mg PO Daily today - will attempt to start maintenance lasix as next medication vs SGLT2i (based on kidney and creatinine) (kidney is improving, potential 7/11 start) - would not plan to return lisinopril; if BP remains elevated and creatinine continues to improve  would add low dose losartan for transition to [facility] as outpatient - potential 7/11 discharge; will need heart failure educator prior to DC; dicussed fluid and salt load with patient using his [facility] dinner as a teaching scaffold - we will work to arrange follow up  Will continue to follow   For questions or updates, please contact [facility] Please consult [email or web address] for contact info under Cardiology/STEMI.      Signed, Christell Constant, MD  11/01/2020, 8:01 AM

## 2020-11-01 NOTE — Progress Notes (Signed)
PROGRESS NOTE    Gerald Williams  TLX:[id number] DOB: 1950-10-02 DOA: 10/29/2020 PCP: [facility]    Brief Narrative:  69/M with history of advanced COPD on 5 L home O2, hypertension, dyslipidemia presented to the ED with 2-day history of left-sided chest pain. Troponins negative.  EKG is nonischemic.  Chest pain improved with aspirin.  Echocardiogram noted EF of 35% global hypokinesis, diastolic dysfunction. -Left heart cath 7/8 evening noted normal coronaries, elevated filling pressures, EF of 40% -Overnight became profoundly hypotensive requiring 3 L fluid bolus -Blood pressure is stable and improved  Assessment & Plan:  Atypical chest pain -Ruled out for ACS, chest pain has resolved -EKG nonischemic, high-sensitivity troponins are negative -Left heart cath with normal coronaries  Nonischemic cardiomyopathy Acute systolic CHF -Clinically appears euvolemic this morning, filling pressures were high on on cath 7/8 evening, received IV Lasix, diuretics on hold now -Cardiology following, gradually uptitrating meds -Starting Toprol -Increase activity, ambulate  Hypotension -Overnight following cath on 7/8 evening -I suspect this is secondary to medications, (got Lasix, prazosin, lisinopril and beta-blocker yesterday) and sedation.  BP improved now -Clinically do not suspect acute PE -Monitor clinically, antihypertensives held -Restarting beta-blocker  AKI -due to hypotension, held lisinopril -Improving, monitor  COPD/chronic respiratory failure On 5 L home O2 at baseline -Quit smoking 10 years ago, stable, continue Combivent and Symbicort  DVT prophylaxis: enoxaparin (LOVENOX) injection 40 mg Start: 10/29/20 1800   Code Status: Full code Family Communication: None at the bedside Disposition Plan: Status is: Inpatient due to severity Dispo: The patient is from: Home              Anticipated d/c is to: Home              Patient currently is not medically  stable to d/c.   Difficult to place patient No   Consultants:  Cardiology  Procedures:  Cardiac cath : There is mild to moderate left ventricular systolic dysfunction. The left ventricular ejection fraction is 45-50% by visual estimate. LV end diastolic pressure is moderately elevated.   1.  Normal coronary arteries. 2.  Mildly to moderately reduced LV systolic function with an EF of 40% with global hypokinesis. 3.  Right heart catheterization showed moderately elevated filling pressures, moderate pulmonary hypertension and mildly reduced cardiac output.  Interpretation of right heart numbers was somewhat difficult due to severe respiratory variations and patient's difficulty with breath-hold.   Recommendations: The patient has nonischemic cardiomyopathy and he appears to be volume overloaded. Recommend medical therapy for heart failure and cardiomyopathy. I added furosemide 20 mg intravenously twice daily for volume overload. Consider switching lisinopril to Entresto in the near future and adding spironolactone. Discontinue prazosin if the only indication for use is hypertension.  Antimicrobials:  None   Subjective: -feels well, no complaints, dyspnea at baseline from his COPD  Objective: Vitals:   11/01/20 0300 11/01/20 0730 11/01/20 0829 11/01/20 0832  BP: 110/62 132/66    Pulse: 60 (!) 59    Resp: 20 19    Temp: 98 F (36.7 C) 98 F (36.7 C)    TempSrc: Oral Oral    SpO2: 98% 100% 97% 99%  Weight:      Height:        Intake/Output Summary (Last 24 hours) at 11/01/2020 1001 Last data filed at 11/01/2020 0753 Gross per 24 hour  Intake 720 ml  Output 1250 ml  Net -530 ml   Filed Weights   10/29/20 1748 10/31/20 0300  Weight: 79 kg 80.8 kg    Examination:  General exam: Gen: Awake, Alert, Oriented X 3,  HEENT: no JVD Lungs: poor air movement b/l CVS: S1S2/RRR Abd: soft, Non tender, non distended, BS present Extremities: No edema Skin: no new rashes on  exposed skin Psychiatry: Mood & affect appropriate.     Data Reviewed: I have personally reviewed following labs and imaging studies  CBC: Recent Labs  Lab 10/29/20 0506 10/29/20 1250 10/30/20 1542 10/31/20 0006 11/01/20 0019  WBC 5.1 4.7  --  3.7* 3.6*  NEUTROABS 3.1  --   --   --   --   HGB 11.8* 12.5* 12.2*  11.9* 10.5* 10.6*  HCT 37.0* 38.5* 36.0*  35.0* 32.7* 32.3*  MCV 92.3 92.3  --  90.8 90.5  PLT 243 256  --  233 223   Basic Metabolic Panel: Recent Labs  Lab 10/29/20 0506 10/29/20 1250 10/30/20 1542 10/31/20 0006 10/31/20 1150 11/01/20 0019  NA 141  --  138  138 136 141 141  K 4.6  --  4.1  4.1 3.8 3.6 3.9  CL 103  --   --  101 106 108  CO2 32  --   --  31 31 29   GLUCOSE 111*  --   --  152* 136* 152*  BUN 17  --   --  22 18 17   CREATININE 1.16 1.19  --  1.46* 1.27* 1.28*  CALCIUM 9.2  --   --  8.8* 8.7* 8.6*   GFR: Estimated Creatinine Clearance: 54.6 mL/min (A) (by C-G formula based on SCr of 1.28 mg/dL (H)). Liver Function Tests: Recent Labs  Lab 10/29/20 0506  AST 20  ALT 16  ALKPHOS 74  BILITOT 0.6  PROT 7.3  ALBUMIN 3.7   No results for input(s): LIPASE, AMYLASE in the last 168 hours. No results for input(s): AMMONIA in the last 168 hours. Coagulation Profile: No results for input(s): INR, PROTIME in the last 168 hours. Cardiac Enzymes: No results for input(s): CKTOTAL, CKMB, CKMBINDEX, TROPONINI in the last 168 hours. BNP (last 3 results) No results for input(s): PROBNP in the last 8760 hours. HbA1C: No results for input(s): HGBA1C in the last 72 hours. CBG: Recent Labs  Lab 10/30/20 2342  GLUCAP 145*   Lipid Profile: Recent Labs    10/29/20 1250  CHOL 244*  HDL 79  LDLCALC 147*  TRIG 92  CHOLHDL 3.1   Thyroid Function Tests: Recent Labs    11/01/20 0019  TSH 0.396   Anemia Panel: No results for input(s): VITAMINB12, FOLATE, FERRITIN, TIBC, IRON, RETICCTPCT in the last 72 hours. Sepsis Labs: Recent Labs  Lab  10/31/20 0006 10/31/20 0246  LATICACIDVEN 1.0 0.7    Recent Results (from the past 240 hour(s))  SARS CORONAVIRUS 2 (TAT 6-24 HRS) Nasopharyngeal Nasopharyngeal Swab     Status: None   Collection Time: 10/29/20  3:07 PM   Specimen: Nasopharyngeal Swab  Result Value Ref Range Status   SARS Coronavirus 2 NEGATIVE NEGATIVE Final    Comment: (NOTE) SARS-CoV-2 target nucleic acids are NOT DETECTED.  The SARS-CoV-2 RNA is generally detectable in upper and lower respiratory specimens during the acute phase of infection. Negative results do not preclude SARS-CoV-2 infection, do not rule out co-infections with other pathogens, and should not be used as the sole basis for treatment or other patient management decisions. Negative results must be combined with clinical observations, patient history, and epidemiological information. The expected result is Negative.  Fact Sheet for Patients: HairSlick.no  Fact Sheet for Healthcare Providers: quierodirigir.com  This test is not yet approved or cleared by the [place] FDA and  has been authorized for detection and/or diagnosis of SARS-CoV-2 by FDA under an Emergency Use Authorization (EUA). This EUA will remain  in effect (meaning this test can be used) for the duration of the COVID-19 declaration under Se ction 564(b)(1) of the Act, 21 U.S.C. section 360bbb-3(b)(1), unless the authorization is terminated or revoked sooner.  Performed at [facility], [address] [place], [place] [address]          Radiology Studies: CARDIAC CATHETERIZATION  Result Date: 10/30/2020  There is mild to moderate left ventricular systolic dysfunction.  The left ventricular ejection fraction is 45-50% by visual estimate.  LV end diastolic pressure is moderately elevated.  1.  Normal coronary arteries. 2.  Mildly to moderately reduced LV systolic function with an EF of 40% with global  hypokinesis. 3.  Right heart catheterization showed moderately elevated filling pressures, moderate pulmonary hypertension and mildly reduced cardiac output.  Interpretation of right heart numbers was somewhat difficult due to severe respiratory variations and patient's difficulty with breath-hold. Recommendations: The patient has nonischemic cardiomyopathy and he appears to be volume overloaded. Recommend medical therapy for heart failure and cardiomyopathy. I added furosemide 20 mg intravenously twice daily for volume overload. Consider switching lisinopril to Entresto in the near future and adding spironolactone. Discontinue prazosin if the only indication for use is hypertension.   DG Chest Port 1 View  Result Date: 10/30/2020 CLINICAL DATA:  Acute respiratory distress EXAM: PORTABLE CHEST 1 VIEW COMPARISON:  10/29/2020 FINDINGS: Cardiac shadow is stable. Aortic calcifications are again seen and stable. Lungs are well aerated bilaterally. Areas of focal scarring are again seen bilaterally in the mid lungs. No new focal infiltrate or effusion is seen. No bony abnormality is noted. IMPRESSION: Bilateral scarring stable from the prior exam. No acute abnormality noted. Electronically Signed   By: Alcide Clever M.D.   On: 10/30/2020 23:57   VAS [place] LOWER EXTREMITY VENOUS (DVT)  Result Date: 10/31/2020  Lower Venous DVT Study Patient Name:  KAIS MONJE  Date of Exam:   10/31/2020 Medical Rec #: [id number]       Accession #:    [id number] Date of Birth: August 21, 1950       Patient Gender: M Patient Age:   69Y Exam Location:  [facility] Procedure:      VAS [place] LOWER EXTREMITY VENOUS (DVT) Referring Phys: [id number] Dody Smartt Nesta --------------------------------------------------------------------------------  Indications: Edema.  Comparison Study: no prior Performing Technologist: [place] Ponder RVS  Examination Guidelines: A complete evaluation includes B-mode imaging, spectral Doppler, color Doppler, and power  Doppler as needed of all accessible portions of each vessel. Bilateral testing is considered an integral part of a complete examination. Limited examinations for reoccurring indications may be performed as noted. The reflux portion of the exam is performed with the patient in reverse Trendelenburg.  +---------+---------------+---------+-----------+----------+--------------+ RIGHT    CompressibilityPhasicitySpontaneityPropertiesThrombus Aging +---------+---------------+---------+-----------+----------+--------------+ CFV      Full           Yes      Yes                                 +---------+---------------+---------+-----------+----------+--------------+ SFJ      Full                                                        +---------+---------------+---------+-----------+----------+--------------+  FV Prox  Full                                                        +---------+---------------+---------+-----------+----------+--------------+ FV Mid   Full                                                        +---------+---------------+---------+-----------+----------+--------------+ FV DistalFull                                                        +---------+---------------+---------+-----------+----------+--------------+ PFV      Full                                                        +---------+---------------+---------+-----------+----------+--------------+ POP      Full           Yes      Yes                                 +---------+---------------+---------+-----------+----------+--------------+ PTV      Full                                                        +---------+---------------+---------+-----------+----------+--------------+ PERO     Full                                                        +---------+---------------+---------+-----------+----------+--------------+    +---------+---------------+---------+-----------+----------+--------------+ LEFT     CompressibilityPhasicitySpontaneityPropertiesThrombus Aging +---------+---------------+---------+-----------+----------+--------------+ CFV      Full           Yes      Yes                                 +---------+---------------+---------+-----------+----------+--------------+ SFJ      Full                                                        +---------+---------------+---------+-----------+----------+--------------+ FV Prox  Full                                                        +---------+---------------+---------+-----------+----------+--------------+  FV Mid   Full                                                        +---------+---------------+---------+-----------+----------+--------------+ FV DistalFull                                                        +---------+---------------+---------+-----------+----------+--------------+ PFV      Full                                                        +---------+---------------+---------+-----------+----------+--------------+ POP      Full           Yes      Yes                                 +---------+---------------+---------+-----------+----------+--------------+ PTV      Full                                                        +---------+---------------+---------+-----------+----------+--------------+ PERO     Full                                                        +---------+---------------+---------+-----------+----------+--------------+     Summary: BILATERAL: - No evidence of deep vein thrombosis seen in the lower extremities, bilaterally. -No evidence of popliteal cyst, bilaterally.   *See table(s) above for measurements and observations. Electronically signed by Heath Lark on 10/31/2020 at 4:47:21 PM.    Final         Scheduled Meds:  aspirin EC  81 mg Oral Daily    atorvastatin  80 mg Oral Daily   enoxaparin (LOVENOX) injection  40 mg Subcutaneous Q24H   ipratropium-albuterol  3 mL Inhalation BID   metoprolol succinate  25 mg Oral Daily   mirtazapine  30 mg Oral QHS   mometasone-formoterol  2 puff Inhalation BID   sodium chloride flush  3 mL Intravenous Q12H   sodium chloride flush  3 mL Intravenous Q12H   Continuous Infusions:  sodium chloride     sodium chloride     sodium chloride       LOS: 2 days    Time spent: 25 minutes  Zannie Cove, MD [facility]

## 2020-11-02 ENCOUNTER — Encounter (HOSPITAL_COMMUNITY): Payer: Self-pay | Admitting: Cardiovascular Disease

## 2020-11-02 ENCOUNTER — Other Ambulatory Visit (HOSPITAL_COMMUNITY): Payer: Self-pay

## 2020-11-02 LAB — BASIC METABOLIC PANEL
Anion gap: 5 (ref 5–15)
BUN: 15 mg/dL (ref 8–23)
CO2: 31 mmol/L (ref 22–32)
Calcium: 8.6 mg/dL — ABNORMAL LOW (ref 8.9–10.3)
Chloride: 106 mmol/L (ref 98–111)
Creatinine, Ser: 1.45 mg/dL — ABNORMAL HIGH (ref 0.61–1.24)
GFR, Estimated: 52 mL/min — ABNORMAL LOW (ref >60)
Glucose, Bld: 151 mg/dL — ABNORMAL HIGH (ref 70–99)
Potassium: 3.6 mmol/L (ref 3.5–5.1)
Sodium: 142 mmol/L (ref 135–145)

## 2020-11-02 MED ORDER — FUROSEMIDE 20 MG PO TABS: 20.0000 mg | ORAL_TABLET | ORAL | 0 refills | Status: DC | PRN

## 2020-11-02 MED ORDER — LOSARTAN POTASSIUM 25 MG PO TABS
25.0000 mg | ORAL_TABLET | Freq: Every day | ORAL | 0 refills | Status: DC
  Filled 2020-11-02: qty 30, 30d supply, fill #0

## 2020-11-02 MED ORDER — LOSARTAN POTASSIUM 25 MG PO TABS: 25.0000 mg | ORAL_TABLET | Freq: Every day | ORAL | 0 refills | Status: DC

## 2020-11-02 MED ORDER — FUROSEMIDE 20 MG PO TABS
20.0000 mg | ORAL_TABLET | ORAL | 0 refills | Status: DC | PRN
Start: 2020-11-02 — End: 2020-11-02

## 2020-11-02 MED ORDER — FUROSEMIDE 20 MG PO TABS
20.0000 mg | ORAL_TABLET | ORAL | 0 refills | Status: DC | PRN
  Filled 2020-11-02: qty 15, 30d supply, fill #0

## 2020-11-02 NOTE — TOC Progression Note (Addendum)
Transition of Care [facility]) - Progression Note    Patient Details  Name: Gerald Williams MRN: [id number] Date of Birth: 08-Jan-1951  Transition of Care [facility]) CM/SW Contact  Leone Haven, RN Phone Number: 11/02/2020, 10:31 AM  Clinical Narrative:    Patient goes to [facility] , PCP is Dr. Hardin Negus. [facility] notification number is [id number].  Dr Hardin Negus fax number is [phone].        Expected Discharge Plan and Services           Expected Discharge Date: 11/02/20                                     Social Determinants of Health (SDOH) Interventions    Readmission Risk Interventions No flowsheet data found.

## 2020-11-02 NOTE — Plan of Care (Signed)
Nutrition Education Note  RD consulted for nutrition education regarding a Heart Healthy diet. Spoke with pt at bedside who reports that he is discharging today. Pt reports that he wants to learn how to eat a low sodium diet. He states that at home he consumes 1-2 meals daily and snacks a lot at night. If pt has breakfast in the morning, he will consume 3 pieces of bacon on a breakfast sandwich with eggs or 1 sausage patty on a breakfast sandwich with eggs. Pt reports that his meal at ~2pm is his largest meal of the day. Pt states that he eats a lot of fried foods. Pt reports that he snacks at night on items like pre-packaged cakes from the grocery store.  Lipid Panel     Component Value Date/Time   CHOL 244 (H) 10/29/2020 1250   TRIG 92 10/29/2020 1250   HDL 79 10/29/2020 1250   CHOLHDL 3.1 10/29/2020 1250   VLDL 18 10/29/2020 1250   LDLCALC 147 (H) 10/29/2020 1250    RD provided "Heart Healthy Nutrition Therapy" handout from the Academy of Nutrition and Dietetics. Reviewed patient's dietary recall. Provided examples on ways to decrease sodium and fat intake in diet. Discouraged intake of processed foods and use of salt shaker. Encouraged fresh fruits and vegetables as well as whole grain sources of carbohydrates to maximize fiber intake. Teach back method used.  Expect good compliance.  Current diet order is Heart Healthy, patient is consuming approximately 75% of meals at this time. Labs and medications reviewed. No further nutrition interventions warranted at this time. If additional nutrition issues arise, please re-consult RD.   Mertie Clause, MS, RD, LDN Inpatient Clinical Dietitian Please see AMiON for contact information.

## 2020-11-02 NOTE — TOC Initial Note (Signed)
Transition of Care [facility]) - Initial/Assessment Note    Patient Details  Name: Gerald Williams MRN: [id number] Date of Birth: July 04, 1950  Transition of Care [facility]) CM/SW Contact:    Leone Haven, RN Phone Number: 11/02/2020, 12:42 PM  Clinical Narrative:                 Patient goes to [facility] , PCP is Dr. Hardin Negus. [facility] notification number is [id number].  Dr Hardin Negus fax number is [phone].  Patient is for dc today.  Expected Discharge Plan: Home/Self Care Barriers to Discharge: No Barriers Identified   Patient Goals and CMS Choice Patient states their goals for this hospitalization and ongoing recovery are:: return home      Expected Discharge Plan and Services Expected Discharge Plan: Home/Self Care   Discharge Planning Services: CM Consult   Living arrangements for the past 2 months: Single Family Home Expected Discharge Date: 11/02/20                                    Prior Living Arrangements/Services Living arrangements for the past 2 months: Single Family Home Lives with:: Adult Children Patient language and need for interpreter reviewed:: Yes Do you feel safe going back to the place where you live?: Yes      Need for Family Participation in Patient Care: Yes (Comment) Care giver support system in place?: Yes (comment)   Criminal Activity/Legal Involvement Pertinent to Current Situation/Hospitalization: No - Comment as needed  Activities of Daily Living Home Assistive Devices/Equipment: Eyeglasses, Nebulizer, Oxygen, CBG Meter, CPAP ADL Screening (condition at time of admission) Patient's cognitive ability adequate to safely complete daily activities?: Yes Is the patient deaf or have difficulty hearing?: No Does the patient have difficulty seeing, even when wearing glasses/contacts?: No Does the patient have difficulty concentrating, remembering, or making decisions?: No Patient able to express need for assistance with ADLs?: Yes Does  the patient have difficulty dressing or bathing?: No Independently performs ADLs?: Yes (appropriate for developmental age) Does the patient have difficulty walking or climbing stairs?: Yes (secondary to weakness) Weakness of Legs: Both Weakness of Arms/Hands: None  Permission Sought/Granted                  Emotional Assessment   Attitude/Demeanor/Rapport: Engaged Affect (typically observed): Appropriate Orientation: : Oriented to  Time, Oriented to Situation, Oriented to Place, Oriented to Self Alcohol / Substance Use: Not Applicable Psych Involvement: No (comment)  Admission diagnosis:  Chest pain [R07.9] Chest pain, unspecified type [R07.9] Angina at rest [facility]) [I20.8] Patient Active Problem List   Diagnosis Date Noted   Angina at rest [name]) 10/30/2020   Acute systolic heart failure (HCC)    Chronic respiratory failure with hypoxia (HCC) 09/18/2018   Exercise hypoxemia 11/07/2017   Frequent headaches 05/23/2017   Acute pain of right knee 12/23/2016   Old tear of lateral meniscus of right knee 12/23/2016   Blood in stool 10/05/2015   HTN (hypertension) 06/18/2015   Cephalalgia 06/18/2015   Dyslipidemia 06/12/2015   COPD GOLD III 05/28/2015   Peripheral neuropathy 05/20/2015   COPD (chronic obstructive pulmonary disease) with emphysema (HCC) 05/20/2015   PTSD (post-traumatic stress disorder) 05/20/2015   Right carotid bruit 05/20/2015   Frequent urination 05/20/2015   Hematuria 07/15/2014   History of tobacco abuse:  quit in ~ 06/2013  07/14/2014   OSA on CPAP 07/14/2014   Pre-diabetes 07/14/2014  Claudication, R>L 07/14/2014   Cocaine use, history of 03/26/2011   Chest pain 03/26/2011   PCP:  [facility]:   [facility] [address] ([address]), [place] [address] [place] ([place]) [place] [address] Phone: [phone] Fax: [phone]     Social Determinants of Health (SDOH) Interventions    Readmission  Risk Interventions Readmission Risk Prevention Plan 11/02/2020  Transportation Screening Complete  PCP or Specialist Appt within 3-5 Days Complete  HRI or Home Care Consult Complete  Social Work Consult for Recovery Care Planning/Counseling Complete  Palliative Care Screening Not Applicable  Medication Review [employer]) Complete  Some recent data might be hidden

## 2020-11-02 NOTE — Progress Notes (Addendum)
Progress Note  Patient Name: Gerald Williams Date of Encounter: 11/02/2020  Primary Cardiologist: Chrystie Nose, MD   Subjective   BP now somewhat hypertensive overnight.  Inpatient Medications    Scheduled Meds:  aspirin EC  81 mg Oral Daily   atorvastatin  80 mg Oral Daily   enoxaparin (LOVENOX) injection  40 mg Subcutaneous Q24H   ipratropium-albuterol  3 mL Inhalation BID   metoprolol succinate  25 mg Oral Daily   mirtazapine  30 mg Oral QHS   mometasone-formoterol  2 puff Inhalation BID   sodium chloride flush  3 mL Intravenous Q12H   sodium chloride flush  3 mL Intravenous Q12H   Continuous Infusions:  sodium chloride     sodium chloride     sodium chloride     PRN Meds: sodium chloride, sodium chloride, acetaminophen, albuterol, calcium carbonate, ipratropium, ondansetron (ZOFRAN) IV, sodium chloride, sodium chloride flush, sodium chloride flush, traMADol   Vital Signs    Vitals:   11/01/20 2115 11/01/20 2351 11/02/20 0327 11/02/20 0734  BP:  (!) 141/95 (!) 139/93 (!) 144/90  Pulse:  81 75 90  Resp:  20 (!) 21 20  Temp:  98.1 F (36.7 C) 98.3 F (36.8 C) 98.6 F (37 C)  TempSrc:  Oral Oral Oral  SpO2: 99% 95% 96% 94%  Weight:      Height:        Intake/Output Summary (Last 24 hours) at 11/02/2020 0932 Last data filed at 11/02/2020 0800 Gross per 24 hour  Intake 843 ml  Output 950 ml  Net -107 ml   Filed Weights   10/29/20 1748 10/31/20 0300  Weight: 79 kg 80.8 kg    Telemetry    SR- NSVT listed is artifact - Personally Reviewed  ECG    SR 63 borderline LHV with secondary repol - Personally Reviewed  Physical Exam   GEN: No acute distress.   Neck: No JVD Cardiac: RRR, no murmurs, rubs, or gallops Respiratory: Significant bilateral expiratory wheezes GI: Soft, nontender, non-distended  MS: No edema; No deformity. Neuro:  Nonfocal  Psych: Normal affect   Labs    Chemistry Recent Labs  Lab 10/29/20 0506 10/29/20 1250  10/31/20 1150 11/01/20 0019 11/02/20 0017  NA 141   < > 141 141 142  K 4.6   < > 3.6 3.9 3.6  CL 103   < > 106 108 106  CO2 32   < > 31 29 31   GLUCOSE 111*   < > 136* 152* 151*  BUN 17   < > 18 17 15   CREATININE 1.16   < > 1.27* 1.28* 1.45*  CALCIUM 9.2   < > 8.7* 8.6* 8.6*  PROT 7.3  --   --   --   --   ALBUMIN 3.7  --   --   --   --   AST 20  --   --   --   --   ALT 16  --   --   --   --   ALKPHOS 74  --   --   --   --   BILITOT 0.6  --   --   --   --   GFRNONAA >60   < > >60 >60 52*  ANIONGAP 6   < > 4* 4* 5   < > = values in this interval not displayed.     Hematology Recent Labs  Lab 10/29/20 1250 10/30/20 1542 10/31/20  0006 11/01/20 0019  WBC 4.7  --  3.7* 3.6*  RBC 4.17*  --  3.60* 3.57*  HGB 12.5* 12.2*  11.9* 10.5* 10.6*  HCT 38.5* 36.0*  35.0* 32.7* 32.3*  MCV 92.3  --  90.8 90.5  MCH 30.0  --  29.2 29.7  MCHC 32.5  --  32.1 32.8  RDW 13.3  --  13.0 13.3  PLT 256  --  233 223    Cardiac EnzymesNo results for input(s): TROPONINI in the last 168 hours. No results for input(s): TROPIPOC in the last 168 hours.   BNP Recent Labs  Lab 10/29/20 0506 10/31/20 0006  BNP 38.9 24.1     DDimer  Recent Labs  Lab 10/31/20 0246  DDIMER 0.55*     Radiology    VAS [place] LOWER EXTREMITY VENOUS (DVT)  Result Date: 10/31/2020  Lower Venous DVT Study Patient Name:  Gerald Williams  Date of Exam:   10/31/2020 Medical Rec #: [id number]       Accession #:    [id number] Date of Birth: 1951/03/25       Patient Gender: M Patient Age:   70Y Exam Location:  [facility] Procedure:      VAS [place] LOWER EXTREMITY VENOUS (DVT) Referring Phys: [id number] PREETHA Rawley --------------------------------------------------------------------------------  Indications: Edema.  Comparison Study: no prior Performing Technologist: [place] Ponder RVS  Examination Guidelines: A complete evaluation includes B-mode imaging, spectral Doppler, color Doppler, and power Doppler as needed of all  accessible portions of each vessel. Bilateral testing is considered an integral part of a complete examination. Limited examinations for reoccurring indications may be performed as noted. The reflux portion of the exam is performed with the patient in reverse Trendelenburg.  +---------+---------------+---------+-----------+----------+--------------+ RIGHT    CompressibilityPhasicitySpontaneityPropertiesThrombus Aging +---------+---------------+---------+-----------+----------+--------------+ CFV      Full           Yes      Yes                                 +---------+---------------+---------+-----------+----------+--------------+ SFJ      Full                                                        +---------+---------------+---------+-----------+----------+--------------+ FV Prox  Full                                                        +---------+---------------+---------+-----------+----------+--------------+ FV Mid   Full                                                        +---------+---------------+---------+-----------+----------+--------------+ FV DistalFull                                                        +---------+---------------+---------+-----------+----------+--------------+  PFV      Full                                                        +---------+---------------+---------+-----------+----------+--------------+ POP      Full           Yes      Yes                                 +---------+---------------+---------+-----------+----------+--------------+ PTV      Full                                                        +---------+---------------+---------+-----------+----------+--------------+ PERO     Full                                                        +---------+---------------+---------+-----------+----------+--------------+   +---------+---------------+---------+-----------+----------+--------------+  LEFT     CompressibilityPhasicitySpontaneityPropertiesThrombus Aging +---------+---------------+---------+-----------+----------+--------------+ CFV      Full           Yes      Yes                                 +---------+---------------+---------+-----------+----------+--------------+ SFJ      Full                                                        +---------+---------------+---------+-----------+----------+--------------+ FV Prox  Full                                                        +---------+---------------+---------+-----------+----------+--------------+ FV Mid   Full                                                        +---------+---------------+---------+-----------+----------+--------------+ FV DistalFull                                                        +---------+---------------+---------+-----------+----------+--------------+ PFV      Full                                                        +---------+---------------+---------+-----------+----------+--------------+  POP      Full           Yes      Yes                                 +---------+---------------+---------+-----------+----------+--------------+ PTV      Full                                                        +---------+---------------+---------+-----------+----------+--------------+ PERO     Full                                                        +---------+---------------+---------+-----------+----------+--------------+     Summary: BILATERAL: - No evidence of deep vein thrombosis seen in the lower extremities, bilaterally. -No evidence of popliteal cyst, bilaterally.   *See table(s) above for measurements and observations. Electronically signed by Heath Lark on 10/31/2020 at 4:47:21 PM.    Final      Patient Profile     70 y.o. male NICM and COPD on 5 L at baseline who presents wth atypical CP; elevated LVEDP; and concern for  iatrogenic hypotension  Assessment & Plan   NICM  Heart Failure with reduced Ejection Fraction  COPD AKI- improving - NYHA class I, Stage C, slightly hypervolemic, etiology unclear - outpatient CMR could be considered - given hypotensive 10/31/20 Early AM, conservative uptitration of GDMT - transitioning back to metoprolol succinate 25 mg PO Daily today  - hold on lasix, BNP 24 - does not appear volume overloaded - will add losartan 25 mg daily for CHF and additional BP control, consider switch to [facility] as outpatient - Please supply lasix 20 mg PRN for weight gain of 1-2 lbs or swelling at discharge -[facility] and [facility] of meds from [facility] pharmacy  Ok for d/c home today from my standpoint. He has a follow-up scheduled already with Gillian Shields, NP on 7/29.   For questions or updates, please contact [facility] Please consult [email or web address] for contact info under Cardiology/STEMI.   Chrystie Nose, MD, [facility], FACP  [place]  [facility]  Medical Director of the [facility]  [facility] Diplomate of the American Board of Clinical Lipidology Attending Cardiologist  Direct Dial: [phone]  Fax: [phone]  Website:  [email or web address]  Chrystie Nose, MD  11/02/2020, 9:32 AM

## 2020-11-02 NOTE — Discharge Summary (Signed)
Physician Discharge Summary  Gerald Williams DGU:[id number] DOB: 29-Feb-1951 DOA: 10/29/2020  PCP: [facility]  Admit date: 10/29/2020 Discharge date: 11/02/2020  Time spent:  Recommendations for Outpatient Follow-up:  [facility] heart care in 2 weeks BMP in 1 week  Discharge Diagnoses:  Active Problems:   Chest pain   Angina at rest [facility])   Acute systolic heart failure (HCC) COPD Chronic respiratory failure on 4-5 L home O2  Discharge Condition: Stable  Diet recommendation: Low-sodium, heart healthy  Filed Weights   10/29/20 1748 10/31/20 0300  Weight: 79 kg 80.8 kg    History of present illness:  69/M with history of advanced COPD on 5 L home O2, hypertension, dyslipidemia presented to the ED with 2-day history of left-sided chest pain.  Hospital Course:   Atypical chest pain -Ruled out for ACS, chest pain has resolved -EKG nonischemic, high-sensitivity troponins are negative -Left heart cath with normal coronaries   Nonischemic cardiomyopathy Acute systolic CHF -ECHO noted EF of 35%, cath noted normal coronaries, filling pressures were high on cath, that evening 7/8 -received IV Lasix, then became hypotensive -Cardiology following, gradually uptitrating meds due to hypotension on 7/8 -toprol resumed and started on low dose losartan -Clinically felt to be euvolemic and hence not felt to require standing dose of diuretics at the time of discharge -added low dose lasix 20mg  PRN -Follow-up with [facility] heart care   Hypotension -Overnight following cath on 7/8 evening -I suspect this is secondary to medications, (got Lasix, prazosin, lisinopril and beta-blocker) and sedation.  BP improved now -Clinically do not suspect acute PE -Monitor clinically, antihypertensives held -Restarted Toprol and added low-dose losartan -Blood pressure stable now   AKI -due to hypotension, held lisinopril -Improving, creatinine 1.4   COPD/chronic respiratory failure On  5 L home O2 at baseline -Quit smoking 10 years ago, stable, continue Combivent and Symbicort    Discharge Exam: Vitals:   11/02/20 1007 11/02/20 1117  BP:  (!) 168/91  Pulse: 73 72  Resp: 19 20  Temp:  98.3 F (36.8 C)  SpO2: 95% 92%    General: AAOx3 Cardiovascular: S1S2/RRR Respiratory: CTAB  Discharge Instructions   Discharge Instructions     Diet - low sodium heart healthy   Complete by: As directed    Increase activity slowly   Complete by: As directed       Allergies as of 11/02/2020       Reactions   Budeson-glycopyrrol-formoterol    Other reaction(s): Retention of urine, Constipation, Retention of urine, Constipation   Gabapentin    Other reaction(s): Dizziness   Nortriptyline    Other reaction(s): Drowsy   Oxcarbazepine Nausea And Vomiting   Tiotropium    Other reaction(s): Cough, Dysphagia, Cough, Dysphagia   Tiotropium Bromide-olodaterol Itching   Olodaterol Hives, Rash        Medication List     STOP taking these medications    lisinopril 40 MG tablet Commonly known as: ZESTRIL   prazosin 2 MG capsule Commonly known as: MINIPRESS   prazosin 5 MG capsule Commonly known as: MINIPRESS       TAKE these medications    acetaminophen 500 MG tablet Commonly known as: TYLENOL Take 1,000 mg by mouth every 6 (six) hours as needed for mild pain, fever or headache.   albuterol (2.5 MG/3ML) 0.083% nebulizer solution Commonly known as: PROVENTIL Take 2.5 mg by nebulization every 6 (six) hours as needed for wheezing or shortness of breath.  albuterol 108 (90 Base) MCG/ACT inhaler Commonly known as: VENTOLIN HFA Inhale 2 puffs into the lungs every 6 (six) hours as needed for shortness of breath or wheezing.   atorvastatin 80 MG tablet Commonly known as: LIPITOR Take 80 mg by mouth daily.   azelastine 0.1 % nasal spray Commonly known as: ASTELIN Place 1 spray into both nostrils 2 (two) times daily. Use in each nostril as directed    budesonide-formoterol 160-4.5 MCG/ACT inhaler Commonly known as: SYMBICORT Inhale 2 puffs into the lungs in the morning and at bedtime.   furosemide 20 MG tablet Commonly known as: Lasix Take 1 tablet (20 mg total) by mouth every other day as needed (for >2 lbs weight gain overnight or swelling).   glucose blood test strip Commonly known as: Cool Blood Glucose Test Strips Used to test blood sugar x2 daily---diagnosis code r73.03--for one touch verio flex   guaiFENesin 600 MG 12 hr tablet Commonly known as: MUCINEX Take 600 mg by mouth 2 (two) times daily.   ipratropium 0.03 % nasal spray Commonly known as: ATROVENT Place 1 spray into both nostrils every 8 (eight) hours as needed for rhinitis.   Ipratropium-Albuterol 20-100 MCG/ACT Aers respimat Commonly known as: COMBIVENT Inhale 1 puff into the lungs 2 (two) times daily.   Lancets Misc Used to test blood sugar x2 daily---diagnosis code r73.03--for one touch verio flex   losartan 25 MG tablet Commonly known as: Cozaar Take 1 tablet (25 mg total) by mouth daily.   metoprolol succinate 50 MG 24 hr tablet Commonly known as: TOPROL-XL Take 25 mg by mouth daily. Take with or immediately following a meal.   mirtazapine 30 MG tablet Commonly known as: REMERON Take 30 mg by mouth at bedtime.   OXYGEN Inhale 4-5 L into the lungs continuous.       Allergies  Allergen Reactions   Budeson-Glycopyrrol-Formoterol     Other reaction(s): Retention of urine, Constipation, Retention of urine, Constipation   Gabapentin     Other reaction(s): Dizziness   Nortriptyline     Other reaction(s): Drowsy   Oxcarbazepine Nausea And Vomiting   Tiotropium     Other reaction(s): Cough, Dysphagia, Cough, Dysphagia   Tiotropium Bromide-Olodaterol Itching   Olodaterol Hives and Rash    Follow-up Information     [facility] Follow up on 11/20/2020.   Specialty: Cardiology Why: at 1:30pm. Please arrive by  1:15pm Contact information: [address] [place] [place] [address] [phone]                 The results of significant diagnostics from this hospitalization (including imaging, microbiology, ancillary and laboratory) are listed below for reference.    Significant Diagnostic Studies: DG Chest 2 View  Result Date: 10/29/2020 CLINICAL DATA:  Chest pain EXAM: CHEST - 2 VIEW COMPARISON:  05/06/2019 FINDINGS: Mild linear scarring bilaterally, stable. There are large lung volumes and emphysema by 2019 CT. There is no edema, consolidation, effusion, or pneumothorax. Normal heart size and mediastinal contours. IMPRESSION: No acute finding. COPD. Electronically Signed   By: Marnee Spring M.D.   On: 10/29/2020 05:19   CARDIAC CATHETERIZATION  Result Date: 10/30/2020  There is mild to moderate left ventricular systolic dysfunction.  The left ventricular ejection fraction is 45-50% by visual estimate.  LV end diastolic pressure is moderately elevated.  1.  Normal coronary arteries. 2.  Mildly to moderately reduced LV systolic function with an EF of 40% with global hypokinesis. 3.  Right  heart catheterization showed moderately elevated filling pressures, moderate pulmonary hypertension and mildly reduced cardiac output.  Interpretation of right heart numbers was somewhat difficult due to severe respiratory variations and patient's difficulty with breath-hold. Recommendations: The patient has nonischemic cardiomyopathy and he appears to be volume overloaded. Recommend medical therapy for heart failure and cardiomyopathy. I added furosemide 20 mg intravenously twice daily for volume overload. Consider switching lisinopril to Entresto in the near future and adding spironolactone. Discontinue prazosin if the only indication for use is hypertension.   DG Chest Port 1 View  Result Date: 10/30/2020 CLINICAL DATA:  Acute respiratory distress EXAM: PORTABLE CHEST 1 VIEW  COMPARISON:  10/29/2020 FINDINGS: Cardiac shadow is stable. Aortic calcifications are again seen and stable. Lungs are well aerated bilaterally. Areas of focal scarring are again seen bilaterally in the mid lungs. No new focal infiltrate or effusion is seen. No bony abnormality is noted. IMPRESSION: Bilateral scarring stable from the prior exam. No acute abnormality noted. Electronically Signed   By: Alcide Clever M.D.   On: 10/30/2020 23:57   ECHOCARDIOGRAM COMPLETE  Result Date: 10/29/2020    ECHOCARDIOGRAM REPORT   Patient Name:   Gerald Williams Date of Exam: 10/29/2020 Medical Rec #:  [id number]      Height:       65.5 in Accession #:    [id number]     Weight:       174.8 lb Date of Birth:  December 19, 1950      BSA:          1.879 m Patient Age:    69 years       BP:           138/88 mmHg Patient Gender: M              HR:           81 bpm. Exam Location:  Inpatient Procedure: 2D Echo, Cardiac Doppler and Color Doppler Indications:    Chest Pain R07.9  History:        Patient has prior history of Echocardiogram examinations, most                 recent 05/07/2019. Risk Factors:Sleep Apnea, Hypertension,                 Dyslipidemia and Former Smoker. Polysubstance abuse.  Sonographer:    Renella Cunas RDCS Referring Phys: [id number] Teddy Spike  Sonographer Comments: Image acquisition challenging due to patient body habitus and Image acquisition challenging due to respiratory motion. IMPRESSIONS  1. Left ventricular ejection fraction, by estimation, is 35%. The left ventricle has moderately decreased function. The left ventricle demonstrates global hypokinesis. Left ventricular diastolic parameters are consistent with Grade I diastolic dysfunction (impaired relaxation).  2. Right ventricular systolic function is mildly reduced. The right ventricular size is normal. Tricuspid regurgitation signal is inadequate for assessing PA pressure.  3. The mitral valve is normal in structure. No evidence of mitral valve regurgitation.  No evidence of mitral stenosis.  4. The aortic valve was not well visualized. Aortic valve regurgitation is not visualized. No aortic stenosis is present.  5. Aortic dilatation noted. There is mild dilatation of the aortic root, measuring 37 mm.  6. The inferior vena cava is normal in size with greater than 50% respiratory variability, suggesting right atrial pressure of 3 mmHg.  7. Technically difficult study with poor acoustic windows. FINDINGS  Left Ventricle: Left ventricular ejection fraction, by estimation, is 35%. The left ventricle  has moderately decreased function. The left ventricle demonstrates global hypokinesis. The left ventricular internal cavity size was normal in size. There is no left ventricular hypertrophy. Left ventricular diastolic parameters are consistent with Grade I diastolic dysfunction (impaired relaxation). Right Ventricle: The right ventricular size is normal. No increase in right ventricular wall thickness. Right ventricular systolic function is mildly reduced. Tricuspid regurgitation signal is inadequate for assessing PA pressure. Left Atrium: Left atrial size was normal in size. Right Atrium: Right atrial size was normal in size. Pericardium: There is no evidence of pericardial effusion. Mitral Valve: The mitral valve is normal in structure. No evidence of mitral valve regurgitation. No evidence of mitral valve stenosis. Tricuspid Valve: The tricuspid valve is not well visualized. Tricuspid valve regurgitation is not demonstrated. Aortic Valve: The aortic valve was not well visualized. Aortic valve regurgitation is not visualized. No aortic stenosis is present. Pulmonic Valve: The pulmonic valve was not well visualized. Pulmonic valve regurgitation is not visualized. Aorta: Aortic dilatation noted. There is mild dilatation of the aortic root, measuring 37 mm. Venous: The inferior vena cava is normal in size with greater than 50% respiratory variability, suggesting right atrial  pressure of 3 mmHg. IAS/Shunts: No atrial level shunt detected by color flow Doppler.  LEFT VENTRICLE PLAX 2D LVIDd:         4.70 cm     Diastology LVIDs:         3.90 cm     LV e' medial:    6.08 cm/s LV PW:         0.80 cm     LV E/e' medial:  6.2 LV IVS:        0.80 cm     LV e' lateral:   7.10 cm/s LVOT diam:     2.30 cm     LV E/e' lateral: 5.3 LV SV:         62 LV SV Index:   33 LVOT Area:     4.15 cm  LV Volumes (MOD) LV vol d, MOD A4C: 89.4 ml LV vol s, MOD A4C: 51.3 ml LV SV MOD A4C:     89.4 ml RIGHT VENTRICLE RV S prime:     9.11 cm/s TAPSE (M-mode): 1.9 cm LEFT ATRIUM           Index       RIGHT ATRIUM          Index LA diam:      2.90 cm 1.54 cm/m  RA Area:     9.23 cm LA Vol (A2C): 19.4 ml 10.33 ml/m RA Volume:   19.50 ml 10.38 ml/m LA Vol (A4C): 19.7 ml 10.49 ml/m  AORTIC VALVE LVOT Vmax:   85.50 cm/s LVOT Vmean:  60.500 cm/s LVOT VTI:    0.149 m  AORTA Ao Root diam: 3.70 cm MITRAL VALVE MV Area (PHT): 3.83 cm    SHUNTS MV Decel Time: 198 msec    Systemic VTI:  0.15 m MV E velocity: 37.70 cm/s  Systemic Diam: 2.30 cm MV A velocity: 67.70 cm/s MV E/A ratio:  0.56 Marca Ancona MD Electronically signed by Marca Ancona MD Signature Date/Time: 10/29/2020/5:03:27 PM    Final    VAS [place] LOWER EXTREMITY VENOUS (DVT)  Result Date: 10/31/2020  Lower Venous DVT Study Patient Name:  Gerald Williams  Date of Exam:   10/31/2020 Medical Rec #: [id number]       Accession #:    [id number] Date of Birth: 07-03-1950  Patient Gender: M Patient Age:   65Y Exam Location:  [facility] Procedure:      VAS [place] LOWER EXTREMITY VENOUS (DVT) Referring Phys: [id number] Jospeh Mangel Humphrey --------------------------------------------------------------------------------  Indications: Edema.  Comparison Study: no prior Performing Technologist: [place] Ponder RVS  Examination Guidelines: A complete evaluation includes B-mode imaging, spectral Doppler, color Doppler, and power Doppler as needed of all accessible portions of  each vessel. Bilateral testing is considered an integral part of a complete examination. Limited examinations for reoccurring indications may be performed as noted. The reflux portion of the exam is performed with the patient in reverse Trendelenburg.  +---------+---------------+---------+-----------+----------+--------------+ RIGHT    CompressibilityPhasicitySpontaneityPropertiesThrombus Aging +---------+---------------+---------+-----------+----------+--------------+ CFV      Full           Yes      Yes                                 +---------+---------------+---------+-----------+----------+--------------+ SFJ      Full                                                        +---------+---------------+---------+-----------+----------+--------------+ FV Prox  Full                                                        +---------+---------------+---------+-----------+----------+--------------+ FV Mid   Full                                                        +---------+---------------+---------+-----------+----------+--------------+ FV DistalFull                                                        +---------+---------------+---------+-----------+----------+--------------+ PFV      Full                                                        +---------+---------------+---------+-----------+----------+--------------+ POP      Full           Yes      Yes                                 +---------+---------------+---------+-----------+----------+--------------+ PTV      Full                                                        +---------+---------------+---------+-----------+----------+--------------+ PERO  Full                                                        +---------+---------------+---------+-----------+----------+--------------+   +---------+---------------+---------+-----------+----------+--------------+ LEFT      CompressibilityPhasicitySpontaneityPropertiesThrombus Aging +---------+---------------+---------+-----------+----------+--------------+ CFV      Full           Yes      Yes                                 +---------+---------------+---------+-----------+----------+--------------+ SFJ      Full                                                        +---------+---------------+---------+-----------+----------+--------------+ FV Prox  Full                                                        +---------+---------------+---------+-----------+----------+--------------+ FV Mid   Full                                                        +---------+---------------+---------+-----------+----------+--------------+ FV DistalFull                                                        +---------+---------------+---------+-----------+----------+--------------+ PFV      Full                                                        +---------+---------------+---------+-----------+----------+--------------+ POP      Full           Yes      Yes                                 +---------+---------------+---------+-----------+----------+--------------+ PTV      Full                                                        +---------+---------------+---------+-----------+----------+--------------+ PERO     Full                                                        +---------+---------------+---------+-----------+----------+--------------+  Summary: BILATERAL: - No evidence of deep vein thrombosis seen in the lower extremities, bilaterally. -No evidence of popliteal cyst, bilaterally.   *See table(s) above for measurements and observations. Electronically signed by Heath Larkhomas Hawken on 10/31/2020 at 4:47:21 PM.    Final     Microbiology: Recent Results (from the past 240 hour(s))  SARS CORONAVIRUS 2 (TAT 6-24 HRS) Nasopharyngeal Nasopharyngeal Swab     Status: None    Collection Time: 10/29/20  3:07 PM   Specimen: Nasopharyngeal Swab  Result Value Ref Range Status   SARS Coronavirus 2 NEGATIVE NEGATIVE Final    Comment: (NOTE) SARS-CoV-2 target nucleic acids are NOT DETECTED.  The SARS-CoV-2 RNA is generally detectable in upper and lower respiratory specimens during the acute phase of infection. Negative results do not preclude SARS-CoV-2 infection, do not rule out co-infections with other pathogens, and should not be used as the sole basis for treatment or other patient management decisions. Negative results must be combined with clinical observations, patient history, and epidemiological information. The expected result is Negative.  Fact Sheet for Patients: HairSlick.nohttps://www.fda.gov/media/138098/download  Fact Sheet for Healthcare Providers: quierodirigir.comhttps://www.fda.gov/media/138095/download  This test is not yet approved or cleared by the [place] FDA and  has been authorized for detection and/or diagnosis of SARS-CoV-2 by FDA under an Emergency Use Authorization (EUA). This EUA will remain  in effect (meaning this test can be used) for the duration of the COVID-19 declaration under Se ction 564(b)(1) of the Act, 21 U.S.C. section 360bbb-3(b)(1), unless the authorization is terminated or revoked sooner.  Performed at [facility], [address] [place], [place] [address]      Labs: Basic Metabolic Panel: Recent Labs  Lab 10/29/20 0506 10/29/20 1250 10/30/20 1542 10/31/20 0006 10/31/20 1150 11/01/20 0019 11/02/20 0017  NA 141  --  138  138 136 141 141 142  K 4.6  --  4.1  4.1 3.8 3.6 3.9 3.6  CL 103  --   --  101 106 108 106  CO2 32  --   --  31 31 29 31   GLUCOSE 111*  --   --  152* 136* 152* 151*  BUN 17  --   --  22 18 17 15   CREATININE 1.16 1.19  --  1.46* 1.27* 1.28* 1.45*  CALCIUM 9.2  --   --  8.8* 8.7* 8.6* 8.6*   Liver Function Tests: Recent Labs  Lab 10/29/20 0506  AST 20  ALT 16  ALKPHOS 74  BILITOT 0.6   PROT 7.3  ALBUMIN 3.7   No results for input(s): LIPASE, AMYLASE in the last 168 hours. No results for input(s): AMMONIA in the last 168 hours. CBC: Recent Labs  Lab 10/29/20 0506 10/29/20 1250 10/30/20 1542 10/31/20 0006 11/01/20 0019  WBC 5.1 4.7  --  3.7* 3.6*  NEUTROABS 3.1  --   --   --   --   HGB 11.8* 12.5* 12.2*  11.9* 10.5* 10.6*  HCT 37.0* 38.5* 36.0*  35.0* 32.7* 32.3*  MCV 92.3 92.3  --  90.8 90.5  PLT 243 256  --  233 223   Cardiac Enzymes: No results for input(s): CKTOTAL, CKMB, CKMBINDEX, TROPONINI in the last 168 hours. BNP: BNP (last 3 results) Recent Labs    10/29/20 0506 10/31/20 0006  BNP 38.9 24.1    ProBNP (last 3 results) No results for input(s): PROBNP in the last 8760 hours.  CBG: Recent Labs  Lab 10/30/20 2342  GLUCAP 145*  Signed:  Zannie Cove MD.  [facility] 11/02/2020, 11:39 AM

## 2020-11-02 NOTE — TOC Transition Note (Signed)
Transition of Care [facility]) - CM/SW Discharge Note   Patient Details  Name: Gerald Williams MRN: [id number] Date of Birth: 06/16/1950  [facility]) CM/SW Contact:  Leone Haven, RN Phone Number: 11/02/2020, 12:43 PM   Clinical Narrative:    Patient is for dc today.   Final next level of care: Home/Self Care Barriers to Discharge: No Barriers Identified   Patient Goals and CMS Choice Patient states their goals for this hospitalization and ongoing recovery are:: return home      Discharge Placement                       Discharge Plan and Services   Discharge Planning Services: CM Consult                                 Social Determinants of Health (SDOH) Interventions     Readmission Risk Interventions Readmission Risk Prevention Plan 11/02/2020  Transportation Screening Complete  PCP or Specialist Appt within 3-5 Days Complete  HRI or Home Care Consult Complete  Social Work Consult for Recovery Care Planning/Counseling Complete  Palliative Care Screening Not Applicable  Medication Review Oceanographer) Complete  Some recent data might be hidden

## 2020-11-10 ENCOUNTER — Other Ambulatory Visit: Payer: Self-pay

## 2020-11-10 ENCOUNTER — Emergency Department (HOSPITAL_COMMUNITY): Payer: No Typology Code available for payment source

## 2020-11-10 ENCOUNTER — Emergency Department (HOSPITAL_COMMUNITY)
Admission: EM | Admit: 2020-11-10 | Discharge: 2020-11-10 | Disposition: A | Discharge status: Home / Self Care | Payer: No Typology Code available for payment source | Attending: Emergency Medicine | Admitting: Emergency Medicine

## 2020-11-10 DIAGNOSIS — Z87891 Personal history of nicotine dependence: Secondary | ICD-10-CM | POA: Insufficient documentation

## 2020-11-10 DIAGNOSIS — Z20822 Contact with and (suspected) exposure to covid-19: Secondary | ICD-10-CM | POA: Insufficient documentation

## 2020-11-10 DIAGNOSIS — I110 Hypertensive heart disease with heart failure: Secondary | ICD-10-CM | POA: Insufficient documentation

## 2020-11-10 DIAGNOSIS — Z7951 Long term (current) use of inhaled steroids: Secondary | ICD-10-CM | POA: Insufficient documentation

## 2020-11-10 DIAGNOSIS — I5021 Acute systolic (congestive) heart failure: Secondary | ICD-10-CM | POA: Diagnosis not present

## 2020-11-10 DIAGNOSIS — Z79899 Other long term (current) drug therapy: Secondary | ICD-10-CM | POA: Diagnosis not present

## 2020-11-10 DIAGNOSIS — J441 Chronic obstructive pulmonary disease with (acute) exacerbation: Secondary | ICD-10-CM | POA: Insufficient documentation

## 2020-11-10 DIAGNOSIS — R0602 Shortness of breath: Secondary | ICD-10-CM | POA: Diagnosis present

## 2020-11-10 LAB — BASIC METABOLIC PANEL
Anion gap: 5 (ref 5–15)
BUN: 16 mg/dL (ref 8–23)
CO2: 28 mmol/L (ref 22–32)
Calcium: 9.2 mg/dL (ref 8.9–10.3)
Chloride: 105 mmol/L (ref 98–111)
Creatinine, Ser: 1.34 mg/dL — ABNORMAL HIGH (ref 0.61–1.24)
GFR, Estimated: 58 mL/min — ABNORMAL LOW (ref >60)
Glucose, Bld: 126 mg/dL — ABNORMAL HIGH (ref 70–99)
Potassium: 4.5 mmol/L (ref 3.5–5.1)
Sodium: 138 mmol/L (ref 135–145)

## 2020-11-10 LAB — CBC WITH DIFFERENTIAL/PLATELET
Abs Immature Granulocytes: 0.02 10*3/uL (ref 0.00–0.07)
Basophils Absolute: 0 10*3/uL (ref 0.0–0.1)
Basophils Relative: 1 %
Eosinophils Absolute: 0.1 10*3/uL (ref 0.0–0.5)
Eosinophils Relative: 2 %
HCT: 35.4 % — ABNORMAL LOW (ref 39.0–52.0)
Hemoglobin: 11.2 g/dL — ABNORMAL LOW (ref 13.0–17.0)
Immature Granulocytes: 0 %
Lymphocytes Relative: 32 %
Lymphs Abs: 1.9 10*3/uL (ref 0.7–4.0)
MCH: 29.3 pg (ref 26.0–34.0)
MCHC: 31.6 g/dL (ref 30.0–36.0)
MCV: 92.7 fL (ref 80.0–100.0)
Monocytes Absolute: 0.4 10*3/uL (ref 0.1–1.0)
Monocytes Relative: 7 %
Neutro Abs: 3.4 10*3/uL (ref 1.7–7.7)
Neutrophils Relative %: 58 %
Platelets: 259 10*3/uL (ref 150–400)
RBC: 3.82 MIL/uL — ABNORMAL LOW (ref 4.22–5.81)
RDW: 13.2 % (ref 11.5–15.5)
WBC: 5.9 10*3/uL (ref 4.0–10.5)
nRBC: 0 % (ref 0.0–0.2)

## 2020-11-10 LAB — RESP PANEL BY RT-PCR (FLU A&B, COVID) ARPGX2
Influenza A by PCR: NEGATIVE
Influenza B by PCR: NEGATIVE
SARS Coronavirus 2 by RT PCR: NEGATIVE

## 2020-11-10 LAB — TROPONIN I (HIGH SENSITIVITY): Troponin I (High Sensitivity): 11 ng/L (ref <18)

## 2020-11-10 LAB — BRAIN NATRIURETIC PEPTIDE: B Natriuretic Peptide: 26.2 pg/mL (ref 0.0–100.0)

## 2020-11-10 MED ORDER — METHYLPREDNISOLONE SODIUM SUCC 125 MG IJ SOLR
125.0000 mg | Freq: Once | INTRAMUSCULAR | Status: AC
  Administered 2020-11-10: 125 mg via INTRAVENOUS
  Filled 2020-11-10: qty 2

## 2020-11-10 MED ORDER — IPRATROPIUM-ALBUTEROL 0.5-2.5 (3) MG/3ML IN SOLN
3.0000 mL | RESPIRATORY_TRACT | Status: AC
  Administered 2020-11-10 (×3): 3 mL via RESPIRATORY_TRACT
  Filled 2020-11-10 (×2): qty 9

## 2020-11-10 MED ORDER — MAGNESIUM SULFATE 2 GM/50ML IV SOLN
2.0000 g | Freq: Once | INTRAVENOUS | Status: AC
  Administered 2020-11-10: 2 g via INTRAVENOUS
  Filled 2020-11-10: qty 50

## 2020-11-10 MED ORDER — PREDNISONE 20 MG PO TABS: ORAL_TABLET | ORAL | 0 refills | Status: DC

## 2020-11-10 NOTE — ED Provider Notes (Signed)
[facility] EMERGENCY DEPARTMENT Provider Note   CSN: [id number] Arrival date & time: 11/10/20  1948     History Chief Complaint  Patient presents with   Shortness of Breath    Gerald Williams is a 70 y.o. male.  70 yo M with a cc of chest pain, sob. Going on for the past few days, was just in the hospital for the same.  Patient does feel like maybe his lungs are tight like his COPD.    The history is provided by the patient.  Shortness of Breath Severity:  Moderate Onset quality:  Gradual Duration:  2 days Timing:  Constant Progression:  Worsening Chronicity:  New Relieved by:  Nothing Worsened by:  Nothing Ineffective treatments:  None tried Associated symptoms: chest pain   Associated symptoms: no abdominal pain, no fever, no headaches, no rash and no vomiting       Past Medical History:  Diagnosis Date   Emphysema    Erectile dysfunction    Hyperlipemia    Hypertension    Polysubstance abuse (HCC)    stopped using crack/cocaine December 4th 20-14   PTSD (post-traumatic stress disorder)    Sleep apnea     Patient Active Problem List   Diagnosis Date Noted   Angina at rest [facility]) 10/30/2020   Acute systolic heart failure (HCC)    Chronic respiratory failure with hypoxia (HCC) 09/18/2018   Exercise hypoxemia 11/07/2017   Frequent headaches 05/23/2017   Acute pain of right knee 12/23/2016   Old tear of lateral meniscus of right knee 12/23/2016   Blood in stool 10/05/2015   HTN (hypertension) 06/18/2015   Cephalalgia 06/18/2015   Dyslipidemia 06/12/2015   COPD GOLD III 05/28/2015   Peripheral neuropathy 05/20/2015   COPD (chronic obstructive pulmonary disease) with emphysema (HCC) 05/20/2015   PTSD (post-traumatic stress disorder) 05/20/2015   Right carotid bruit 05/20/2015   Frequent urination 05/20/2015   Hematuria 07/15/2014   History of tobacco abuse:  quit in ~ 06/2013  07/14/2014   OSA on CPAP 07/14/2014   Pre-diabetes 07/14/2014    Claudication, R>L 07/14/2014   Cocaine use, history of 03/26/2011   Chest pain 03/26/2011    Past Surgical History:  Procedure Laterality Date   CARDIAC CATHETERIZATION     CYSTOSCOPY WITH INSERTION OF UROLIFT N/A 08/03/2020   Procedure: CYSTOSCOPY WITH INSERTION OF UROLIFT;  Surgeon: Marcine Matar, MD;  Location: [facility] ORS;  Service: Urology;  Laterality: N/A;  30 MINS   LEFT HEART CATHETERIZATION WITH CORONARY ANGIOGRAM N/A 07/15/2014   Procedure: LEFT HEART CATHETERIZATION WITH CORONARY ANGIOGRAM;  Surgeon: Lennette Bihari, MD;  Location: [facility] CATH LAB;  Service: Cardiovascular;  Laterality: N/A;   RIGHT/LEFT HEART CATH AND CORONARY ANGIOGRAPHY N/A 10/30/2020   Procedure: RIGHT/LEFT HEART CATH AND CORONARY ANGIOGRAPHY;  Surgeon: Iran Ouch, MD;  Location: [facility] INVASIVE CV LAB;  Service: Cardiovascular;  Laterality: N/A;       Family History  Problem Relation Age of Onset   Diabetes Mother        Deceased   Heart disease Father        Deceased   Heart attack Father        x5   Cancer - Other Sister    Healthy Son    Healthy Daughter    Heart attack Brother        Deceased   Alcohol abuse Brother        Deceased    Social History  Tobacco Use   Smoking status: Former    Packs/day: 1.50    Years: 50.00    Pack years: 75.00    Types: Cigarettes    Start date: 04/26/1963    Quit date: 09/22/2013    Years since quitting: 7.1   Smokeless tobacco: Never   Tobacco comments:    patient using the patch, losenges, and medication  Vaping Use   Vaping Use: Never used  Substance Use Topics   Alcohol use: No    Alcohol/week: 0.0 standard drinks    Comment: He was drinking 3-4 drinks nightly x 40 years, quit in 2014.     Drug use: No    Comment: history of crack use and drug tested weekly at [facility] Medications Prior to Admission medications   Medication Sig Start Date End Date Taking? Authorizing Provider  predniSONE (DELTASONE) 20 MG tablet 2 tabs po daily x  4 days 11/10/20  Yes Melene Plan, DO  acetaminophen (TYLENOL) 500 MG tablet Take 1,000 mg by mouth every 6 (six) hours as needed for mild pain, fever or headache.    [provider]  albuterol (PROVENTIL) (2.5 MG/3ML) 0.083% nebulizer solution Take 2.5 mg by nebulization every 6 (six) hours as needed for wheezing or shortness of breath.    [provider]  albuterol (VENTOLIN HFA) 108 (90 Base) MCG/ACT inhaler Inhale 2 puffs into the lungs every 6 (six) hours as needed for shortness of breath or wheezing. 09/24/20   Alfonse Spruce, MD  atorvastatin (LIPITOR) 80 MG tablet Take 80 mg by mouth daily. 05/12/20   [provider]  azelastine (ASTELIN) 0.1 % nasal spray Place 1 spray into both nostrils 2 (two) times daily. Use in each nostril as directed Patient not taking: Reported on 10/29/2020 09/24/20 10/24/20  Alfonse Spruce, MD  budesonide-formoterol [facility]) 160-4.5 MCG/ACT inhaler Inhale 2 puffs into the lungs in the morning and at bedtime. 04/07/20   [provider]  furosemide (LASIX) 20 MG tablet Take 1 tablet (20 mg total) by mouth every other day as needed (for >2 lbs weight gain overnight or swelling). 11/02/20   Zannie Cove, MD  glucose blood (COOL BLOOD GLUCOSE TEST STRIPS) test strip Used to test blood sugar x2 daily---diagnosis code r73.03--for one touch verio flex 12/08/15   [name], MD  guaiFENesin (MUCINEX) 600 MG 12 hr tablet Take 600 mg by mouth 2 (two) times daily. 02/07/20   [provider]  ipratropium (ATROVENT) 0.03 % nasal spray Place 1 spray into both nostrils every 8 (eight) hours as needed for rhinitis. 10/08/20   Alfonse Spruce, MD  Ipratropium-Albuterol (COMBIVENT) 20-100 MCG/ACT AERS respimat Inhale 1 puff into the lungs 2 (two) times daily. 07/16/20   [provider]  Lancets MISC Used to test blood sugar x2 daily---diagnosis code r73.03--for one touch verio flex 12/08/15   Burns, [name], MD  losartan  (COZAAR) 25 MG tablet Take 1 tablet (25 mg total) by mouth daily. 11/02/20   Zannie Cove, MD  metoprolol succinate (TOPROL-XL) 50 MG 24 hr tablet Take 25 mg by mouth daily. Take with or immediately following a meal.    [provider]  mirtazapine (REMERON) 30 MG tablet Take 30 mg by mouth at bedtime.    [provider]  OXYGEN Inhale 4-5 L into the lungs continuous.    [provider]    Allergies    Budeson-glycopyrrol-formoterol, Gabapentin, Nortriptyline, Oxcarbazepine, Tiotropium, Tiotropium bromide-olodaterol, and  Olodaterol  Review of Systems   Review of Systems  Constitutional:  Negative for chills and fever.  HENT:  Negative for congestion and facial swelling.   Eyes:  Negative for discharge and visual disturbance.  Respiratory:  Positive for shortness of breath.   Cardiovascular:  Positive for chest pain. Negative for palpitations.  Gastrointestinal:  Negative for abdominal pain, diarrhea and vomiting.  Musculoskeletal:  Negative for arthralgias and myalgias.  Skin:  Negative for color change and rash.  Neurological:  Negative for tremors, syncope and headaches.  Psychiatric/Behavioral:  Negative for confusion and dysphoric mood.    Physical Exam Updated Vital Signs BP 132/88   Pulse 76   Temp 97.9 F (36.6 C) (Oral)   Resp 16   SpO2 100%   Physical Exam Vitals and nursing note reviewed.  Constitutional:      Appearance: He is well-developed.  HENT:     Head: Normocephalic and atraumatic.  Eyes:     Pupils: Pupils are equal, round, and reactive to light.  Neck:     Vascular: No JVD.  Cardiovascular:     Rate and Rhythm: Normal rate and regular rhythm.     Heart sounds: No murmur heard.   No friction rub. No gallop.  Pulmonary:     Effort: No respiratory distress.     Breath sounds: Examination of the right-upper field reveals decreased breath sounds. Examination of the left-upper field reveals decreased breath sounds. Examination  of the right-middle field reveals decreased breath sounds. Examination of the left-middle field reveals decreased breath sounds. Examination of the right-lower field reveals decreased breath sounds. Examination of the left-lower field reveals decreased breath sounds. Decreased breath sounds present. No wheezing.     Comments: Diminished breath sounds in all fields Abdominal:     General: There is no distension.     Tenderness: There is no abdominal tenderness. There is no guarding or rebound.  Musculoskeletal:        General: Normal range of motion.     Cervical back: Normal range of motion and neck supple.  Skin:    Coloration: Skin is not pale.     Findings: No rash.  Neurological:     Mental Status: He is alert and oriented to person, place, and time.  Psychiatric:        Behavior: Behavior normal.    ED Results / Procedures / Treatments   Labs (all labs ordered are listed, but only abnormal results are displayed) Labs Reviewed  CBC WITH DIFFERENTIAL/PLATELET - Abnormal; Notable for the following components:      Result Value   RBC 3.82 (*)    Hemoglobin 11.2 (*)    HCT 35.4 (*)    All other components within normal limits  BASIC METABOLIC PANEL - Abnormal; Notable for the following components:   Glucose, Bld 126 (*)    Creatinine, Ser 1.34 (*)    GFR, Estimated 58 (*)    All other components within normal limits  RESP PANEL BY RT-PCR (FLU A&B, COVID) ARPGX2  BRAIN NATRIURETIC PEPTIDE  TROPONIN I (HIGH SENSITIVITY)    EKG EKG Interpretation  Date/Time:  Tuesday November 10 2020 19:59:14 EDT Ventricular Rate:  81 PR Interval:  118 QRS Duration: 80 QT Interval:  379 QTC Calculation: 440 R Axis:   50 Text Interpretation: Sinus arrhythmia Borderline short PR interval flipped t eaves in v3,4 resolved Otherwise no significant change Confirmed by Melene Plan [phone]) on 11/10/2020 8:15:43 PM  Radiology DG Chest [facility]  1 View  Result Date: 11/10/2020 CLINICAL DATA:  Shortness of  breath. Left-sided chest pain. Recent catheterization EXAM: PORTABLE CHEST 1 VIEW COMPARISON:  Most recent radiograph 10/30/2020 cana CT 04/23/2018 FINDINGS: Normal heart size with stable mediastinal contours. Mild chronic bronchial thickening. Stable linear scarring in both lower lung zones. No acute airspace disease. No pleural effusion or pneumothorax. No pulmonary edema. No acute osseous abnormalities are seen. IMPRESSION: 1. Chronic bronchial thickening in bilateral lung scarring. 2. No acute findings. Electronically Signed   By: Narda Rutherford M.D.   On: 11/10/2020 20:36    Procedures Procedures   Medications Ordered in ED Medications  ipratropium-albuterol (DUONEB) 0.5-2.5 (3) MG/3ML nebulizer solution 3 mL (3 mLs Nebulization Given 11/10/20 2043)  methylPREDNISolone sodium succinate (SOLU-MEDROL) 125 mg/2 mL injection 125 mg (125 mg Intravenous Given 11/10/20 2059)  magnesium sulfate IVPB 2 g 50 mL (0 g Intravenous Stopped 11/10/20 2203)    ED Course  I have reviewed the triage vital signs and the nursing notes.  Pertinent labs & imaging results that were available during my care of the patient were reviewed by me and considered in my medical decision making (see chart for details).    MDM Rules/Calculators/A&P                           70 yO M with a chief complaint of shortness of breath.  Going on for a few days now.  Was just in the hospital for the same.  On my exam the patient is very restricted in the lung movement.  Does have a history of end-stage COPD on 5 L at all times.  We will give 3 duo nebs back-to-back steroids and magnesium and reassess.  Chest x-ray blood work.  Patient feeling much better after 3 duo nebs steroids and magnesium.  Much better aeration on repeat lung exam.  Blood work is unremarkable troponin negative BNP negative chest x-ray without focal infiltrate or pneumothorax.  COVID-negative.  Will discharge home.  Burst of steroids.  PCP follow-up.  10:09  PM:  I have discussed the diagnosis/risks/treatment options with the patient and believe the pt to be eligible for discharge home to follow-up with PCP. We also discussed returning to the ED immediately if new or worsening sx occur. We discussed the sx which are most concerning (e.g., sudden worsening pain, fever, inability to tolerate by mouth) that necessitate immediate return. Medications administered to the patient during their visit and any new prescriptions provided to the patient are listed below.  Medications given during this visit Medications  ipratropium-albuterol (DUONEB) 0.5-2.5 (3) MG/3ML nebulizer solution 3 mL (3 mLs Nebulization Given 11/10/20 2043)  methylPREDNISolone sodium succinate (SOLU-MEDROL) 125 mg/2 mL injection 125 mg (125 mg Intravenous Given 11/10/20 2059)  magnesium sulfate IVPB 2 g 50 mL (0 g Intravenous Stopped 11/10/20 2203)     The patient appears reasonably screen and/or stabilized for discharge and I doubt any other medical condition or other [facility] requiring further screening, evaluation, or treatment in the ED at this time prior to discharge.   Final Clinical Impression(s) / ED Diagnoses Final diagnoses:  COPD exacerbation (HCC)    Rx / DC Orders ED Discharge Orders          Ordered    predniSONE (DELTASONE) 20 MG tablet        11/10/20 2207             Melene Plan, DO 11/10/20 2209

## 2020-11-10 NOTE — Discharge Instructions (Addendum)
Use your rescue inhaler or breathing treatment every 4 hours while awake, return for sudden worsening shortness of breath, or if you need to use your inhaler more often.  Take you steroids as prescribed.

## 2020-11-10 NOTE — ED Triage Notes (Addendum)
Pt arrives to ED BIB [facility] due to Lf sided CP that radiates down Lf Arm and worsening SHOB. Per EMS pt has a catheterization last week and his breathing has been worse. Hx of CHF and emphysema. Pt is constantly of 5L [place] but had to bump oxygen to 8L [place]. Per EMS lung sounds diminished. 324mg  Aspirin, 1 sublingual nitro, 5mg  albuterol, 125mg  solumedrol IV, and 0.3mg  EPI 1:1 IM Lf deltoid administered by EMS. Pt a/o x4.   BP 160/110 HR 80 R 24 O2 99% 5L  CBG 85

## 2020-11-20 ENCOUNTER — Encounter: Payer: Self-pay | Admitting: Internal Medicine

## 2020-11-20 ENCOUNTER — Emergency Department (HOSPITAL_COMMUNITY): Payer: No Typology Code available for payment source

## 2020-11-20 ENCOUNTER — Ambulatory Visit (HOSPITAL_BASED_OUTPATIENT_CLINIC_OR_DEPARTMENT_OTHER): Payer: No Typology Code available for payment source | Admitting: Family

## 2020-11-20 ENCOUNTER — Other Ambulatory Visit: Payer: Self-pay

## 2020-11-20 ENCOUNTER — Ambulatory Visit (INDEPENDENT_AMBULATORY_CARE_PROVIDER_SITE_OTHER): Payer: No Typology Code available for payment source | Admitting: Internal Medicine

## 2020-11-20 ENCOUNTER — Emergency Department (HOSPITAL_COMMUNITY)
Admission: EM | Admit: 2020-11-20 | Discharge: 2020-11-20 | Disposition: A | Discharge status: Home / Self Care | Payer: No Typology Code available for payment source | Attending: Emergency Medicine | Admitting: Emergency Medicine

## 2020-11-20 ENCOUNTER — Encounter (HOSPITAL_COMMUNITY): Payer: Self-pay | Admitting: Emergency Medicine

## 2020-11-20 DIAGNOSIS — R000 Tachycardia, unspecified: Secondary | ICD-10-CM | POA: Insufficient documentation

## 2020-11-20 DIAGNOSIS — Z79899 Other long term (current) drug therapy: Secondary | ICD-10-CM | POA: Diagnosis not present

## 2020-11-20 DIAGNOSIS — Z7951 Long term (current) use of inhaled steroids: Secondary | ICD-10-CM | POA: Insufficient documentation

## 2020-11-20 DIAGNOSIS — Z87891 Personal history of nicotine dependence: Secondary | ICD-10-CM | POA: Diagnosis not present

## 2020-11-20 DIAGNOSIS — R0781 Pleurodynia: Secondary | ICD-10-CM | POA: Insufficient documentation

## 2020-11-20 DIAGNOSIS — J449 Chronic obstructive pulmonary disease, unspecified: Secondary | ICD-10-CM

## 2020-11-20 DIAGNOSIS — J9611 Chronic respiratory failure with hypoxia: Secondary | ICD-10-CM | POA: Diagnosis not present

## 2020-11-20 DIAGNOSIS — R079 Chest pain, unspecified: Secondary | ICD-10-CM | POA: Diagnosis present

## 2020-11-20 DIAGNOSIS — R059 Cough, unspecified: Secondary | ICD-10-CM | POA: Diagnosis not present

## 2020-11-20 DIAGNOSIS — Z20822 Contact with and (suspected) exposure to covid-19: Secondary | ICD-10-CM | POA: Insufficient documentation

## 2020-11-20 DIAGNOSIS — R600 Localized edema: Secondary | ICD-10-CM | POA: Insufficient documentation

## 2020-11-20 DIAGNOSIS — I110 Hypertensive heart disease with heart failure: Secondary | ICD-10-CM | POA: Diagnosis not present

## 2020-11-20 DIAGNOSIS — R0602 Shortness of breath: Secondary | ICD-10-CM | POA: Diagnosis not present

## 2020-11-20 DIAGNOSIS — I5021 Acute systolic (congestive) heart failure: Secondary | ICD-10-CM | POA: Insufficient documentation

## 2020-11-20 LAB — CBC
HCT: 35.5 % — ABNORMAL LOW (ref 39.0–52.0)
Hemoglobin: 11.6 g/dL — ABNORMAL LOW (ref 13.0–17.0)
MCH: 30 pg (ref 26.0–34.0)
MCHC: 32.7 g/dL (ref 30.0–36.0)
MCV: 91.7 fL (ref 80.0–100.0)
Platelets: 217 10*3/uL (ref 150–400)
RBC: 3.87 MIL/uL — ABNORMAL LOW (ref 4.22–5.81)
RDW: 13.8 % (ref 11.5–15.5)
WBC: 5.8 10*3/uL (ref 4.0–10.5)
nRBC: 0 % (ref 0.0–0.2)

## 2020-11-20 LAB — D-DIMER, QUANTITATIVE: D-Dimer, Quant: 0.49 ug/mL-FEU (ref 0.00–0.50)

## 2020-11-20 LAB — TROPONIN I (HIGH SENSITIVITY)
Troponin I (High Sensitivity): 12 ng/L (ref <18)
Troponin I (High Sensitivity): 11 ng/L (ref <18)

## 2020-11-20 LAB — RESP PANEL BY RT-PCR (FLU A&B, COVID) ARPGX2
Influenza A by PCR: NEGATIVE
Influenza B by PCR: NEGATIVE
SARS Coronavirus 2 by RT PCR: NEGATIVE

## 2020-11-20 LAB — BRAIN NATRIURETIC PEPTIDE: B Natriuretic Peptide: 108 pg/mL — ABNORMAL HIGH (ref 0.0–100.0)

## 2020-11-20 LAB — BASIC METABOLIC PANEL
Anion gap: 9 (ref 5–15)
BUN: 20 mg/dL (ref 8–23)
CO2: 35 mmol/L — ABNORMAL HIGH (ref 22–32)
Calcium: 9.3 mg/dL (ref 8.9–10.3)
Chloride: 99 mmol/L (ref 98–111)
Creatinine, Ser: 1.18 mg/dL (ref 0.61–1.24)
GFR, Estimated: >60 mL/min (ref >60)
Glucose, Bld: 130 mg/dL — ABNORMAL HIGH (ref 70–99)
Potassium: 3.7 mmol/L (ref 3.5–5.1)
Sodium: 143 mmol/L (ref 135–145)

## 2020-11-20 LAB — PROTIME-INR
INR: 0.9 (ref 0.8–1.2)
Prothrombin Time: 11.9 seconds (ref 11.4–15.2)

## 2020-11-20 MED ORDER — IOHEXOL 350 MG/ML SOLN
80.0000 mL | Freq: Once | INTRAVENOUS | Status: AC | PRN
  Administered 2020-11-20: 80 mL via INTRAVENOUS

## 2020-11-20 MED ORDER — DOXYCYCLINE HYCLATE 100 MG PO CAPS: 100.0000 mg | ORAL_CAPSULE | Freq: Two times a day (BID) | ORAL | 0 refills | Status: DC

## 2020-11-20 MED ORDER — BUDESONIDE-FORMOTEROL FUMARATE 160-4.5 MCG/ACT IN AERO: INHALATION_SPRAY | RESPIRATORY_TRACT | 11 refills | Status: DC

## 2020-11-20 NOTE — Assessment & Plan Note (Signed)
As of  11/19/2018  = 3lpm 24/7   - 03/11/2019 Patient Saturations on Room Air at Rest = 100%  Room Air while Ambulating = 88% and on 3 Liters of pulsed oxygen while Ambulating = 92% so approved for POC    Advised :  Make sure you check your oxygen saturation  at your highest level of activity  to be sure it stays over 90% and adjust  02 flow upward to maintain this level if needed but remember to turn it back to previous settings when you stop (to conserve your supply).    F/u once sort out acute pleuritic cap in ov with all meds in hand using a trust but verify approach to confirm accurate Medication  Reconciliation The principal here is that until we are certain that the  patients are doing what we've asked, it makes no sense to ask them to do more.          Each maintenance medication was reviewed in detail including emphasizing most importantly the difference between maintenance and prns and under what circumstances the prns are to be triggered using an action plan format where appropriate.  Total time for H and P, chart review, counseling, reviewing hfa/02 device(s) and generating customized AVS unique to this acute office visit / same day charting for pt not seen in 1.5 y  = 45 min

## 2020-11-20 NOTE — Assessment & Plan Note (Signed)
Onset acute sev days p [facility]  10/30/20  with wedge of 28   This is not copd chest tightness but classically pleuritic cp in pt at high risk of PE so needs to return to ER asap for w/u then return here p non cardiac cp is sorted out

## 2020-11-20 NOTE — ED Provider Notes (Signed)
[facility]-EMERGENCY DEPT Provider Note   CSN: [id number] Arrival date & time: 11/20/20  1003     History Chief Complaint  Patient presents with   Possible PE    Gerald Williams is a 70 y.o. male.  He is a former smoker and has significant COPD 5 L oxygen 24/7.  He had a recent cardiac cath that did not show any obstructive disease but did have a low ejection fraction.  No medications were adjusted.  For the last 2 weeks he has had increased shortness of breath and some sharp left-sided chest pain worse with cough and deep breathing.  No hemoptysis.  No fevers.  He is does endorse some leg swelling.  No leg pain.  He saw pulmonology today who was concerned that he may have a PE.  He is not on any blood thinners.  He is COVID vaccinated and boosted.  The history is provided by the patient.  Shortness of Breath Severity:  Moderate Onset quality:  Gradual Duration:  2 weeks Timing:  Constant Progression:  Unchanged Chronicity:  Recurrent Relieved by:  Nothing Worsened by:  Activity Ineffective treatments:  Oxygen, rest and inhaler Associated symptoms: chest pain and cough   Associated symptoms: no abdominal pain, no fever, no headaches, no hemoptysis, no rash, no sore throat, no sputum production and no vomiting   Chest pain:    Quality: stabbing     Severity:  Moderate   Onset quality:  Gradual   Duration:  2 weeks   Timing:  Intermittent   Progression:  Unchanged   Chronicity:  New     Past Medical History:  Diagnosis Date   Emphysema    Erectile dysfunction    Hyperlipemia    Hypertension    Polysubstance abuse (HCC)    stopped using crack/cocaine December 4th 20-14   PTSD (post-traumatic stress disorder)    Sleep apnea     Patient Active Problem List   Diagnosis Date Noted   Pleuritic chest pain 11/20/2020   Angina at rest [facility](HCC) 10/30/2020   Acute systolic heart failure (HCC)    Chronic respiratory failure with hypoxia (HCC) 09/18/2018    Exercise hypoxemia 11/07/2017   Frequent headaches 05/23/2017   Acute pain of right knee 12/23/2016   Old tear of lateral meniscus of right knee 12/23/2016   Blood in stool 10/05/2015   HTN (hypertension) 06/18/2015   Cephalalgia 06/18/2015   Dyslipidemia 06/12/2015   COPD GOLD III 05/28/2015   Peripheral neuropathy 05/20/2015   COPD (chronic obstructive pulmonary disease) with emphysema (HCC) 05/20/2015   PTSD (post-traumatic stress disorder) 05/20/2015   Right carotid bruit 05/20/2015   Frequent urination 05/20/2015   Hematuria 07/15/2014   History of tobacco abuse:  quit in ~ 06/2013  07/14/2014   OSA on CPAP 07/14/2014   Pre-diabetes 07/14/2014   Claudication, R>L 07/14/2014   Cocaine use, history of 03/26/2011   Chest pain 03/26/2011    Past Surgical History:  Procedure Laterality Date   CARDIAC CATHETERIZATION     CYSTOSCOPY WITH INSERTION OF UROLIFT N/A 08/03/2020   Procedure: CYSTOSCOPY WITH INSERTION OF UROLIFT;  Surgeon: Marcine Matarahlstedt, Stephen, MD;  Location: [facility] ORS;  Service: Urology;  Laterality: N/A;  30 MINS   LEFT HEART CATHETERIZATION WITH CORONARY ANGIOGRAM N/A 07/15/2014   Procedure: LEFT HEART CATHETERIZATION WITH CORONARY ANGIOGRAM;  Surgeon: Lennette Biharihomas A Kelly, MD;  [facility];  Service: Cardiovascular;  Laterality: N/A;   RIGHT/LEFT HEART CATH AND CORONARY ANGIOGRAPHY N/A 10/30/2020  Procedure: RIGHT/LEFT HEART CATH AND CORONARY ANGIOGRAPHY;  Surgeon: Iran Ouch, MD;  Location: [facility] INVASIVE CV LAB;  Service: Cardiovascular;  Laterality: N/A;       Family History  Problem Relation Age of Onset   Diabetes Mother        Deceased   Heart disease Father        Deceased   Heart attack Father        x5   Cancer - Other Sister    Healthy Son    Healthy Daughter    Heart attack Brother        Deceased   Alcohol abuse Brother        Deceased    Social History   Tobacco Use   Smoking status: Former    Packs/day: 1.50    Years: 50.00    Pack  years: 75.00    Types: Cigarettes    Start date: 04/26/1963    Quit date: 09/22/2013    Years since quitting: 7.1   Smokeless tobacco: Never   Tobacco comments:    patient using the patch, losenges, and medication  Vaping Use   Vaping Use: Never used  Substance Use Topics   Alcohol use: No    Alcohol/week: 0.0 standard drinks    Comment: He was drinking 3-4 drinks nightly x 40 years, quit in 2014.     Drug use: No    Comment: history of crack use and drug tested weekly at the [facility] Medications Prior to Admission medications   Medication Sig Start Date End Date Taking? Authorizing Provider  acetaminophen (TYLENOL) 500 MG tablet Take 1,000 mg by mouth every 6 (six) hours as needed for mild pain, fever or headache.    [provider]  albuterol (PROVENTIL) (2.5 MG/3ML) 0.083% nebulizer solution Take 2.5 mg by nebulization every 6 (six) hours as needed for wheezing or shortness of breath.    [provider]  albuterol (VENTOLIN HFA) 108 (90 Base) MCG/ACT inhaler Inhale 2 puffs into the lungs every 6 (six) hours as needed for shortness of breath or wheezing. 09/24/20   Alfonse Spruce, MD  atorvastatin (LIPITOR) 80 MG tablet Take 80 mg by mouth daily. 05/12/20   [provider]  budesonide-formoterol (SYMBICORT) 160-4.5 MCG/ACT inhaler Take 2 puffs first thing in am and then another 2 puffs about 12 hours later. 11/20/20   [name], MD  furosemide (LASIX) 20 MG tablet Take 1 tablet (20 mg total) by mouth every other day as needed (for >2 lbs weight gain overnight or swelling). 11/02/20   Zannie Cove, MD  glucose blood (COOL BLOOD GLUCOSE TEST STRIPS) test strip Used to test blood sugar x2 daily---diagnosis code r73.03--for one touch verio flex 12/08/15   [name], MD  guaiFENesin (MUCINEX) 600 MG 12 hr tablet Take 600 mg by mouth 2 (two) times daily. 02/07/20   [provider]  ipratropium (ATROVENT) 0.03 % nasal spray Place 1 spray into  both nostrils every 8 (eight) hours as needed for rhinitis. 10/08/20   Alfonse Spruce, MD  Ipratropium-Albuterol (COMBIVENT) 20-100 MCG/ACT AERS respimat Inhale 1 puff into the lungs 2 (two) times daily. 07/16/20   [provider]  Lancets MISC Used to test blood sugar x2 daily---diagnosis code r73.03--for one touch verio flex 12/08/15   Burns, [name], MD  losartan (COZAAR) 25 MG tablet Take 1 tablet (25 mg total) by mouth daily. 11/02/20   Zannie Cove, MD  metoprolol succinate (TOPROL-XL) 50 MG 24 hr tablet Take 25 mg by mouth daily. Take with or immediately following a meal.    [provider]  mirtazapine (REMERON) 30 MG tablet Take 30 mg by mouth at bedtime.    [provider]  OXYGEN Inhale 4-5 L into the lungs continuous.    [provider]    Allergies    Budeson-glycopyrrol-formoterol, Gabapentin, Nortriptyline, Oxcarbazepine, Tiotropium, Tiotropium bromide-olodaterol, and Olodaterol  Review of Systems   Review of Systems  Constitutional:  Negative for fever.  HENT:  Negative for sore throat.   Eyes:  Negative for visual disturbance.  Respiratory:  Positive for cough and shortness of breath. Negative for hemoptysis and sputum production.   Cardiovascular:  Positive for chest pain and leg swelling.  Gastrointestinal:  Negative for abdominal pain and vomiting.  Genitourinary:  Negative for dysuria.  Musculoskeletal:  Negative for back pain.  Skin:  Negative for rash.  Neurological:  Negative for headaches.   Physical Exam Updated Vital Signs There were no vitals taken for this visit.  Physical Exam Vitals and nursing note reviewed.  Constitutional:      Appearance: Normal appearance. He is well-developed.  HENT:     Head: Normocephalic and atraumatic.  Eyes:     Conjunctiva/sclera: Conjunctivae normal.  Cardiovascular:     Rate and Rhythm: Regular rhythm. Tachycardia present.     Pulses: Normal pulses.     Heart sounds: No  murmur heard. Pulmonary:     Effort: Tachypnea and accessory muscle usage present. No respiratory distress.     Breath sounds: Wheezing (few scattered) present.  Abdominal:     Palpations: Abdomen is soft.     Tenderness: There is no abdominal tenderness.  Musculoskeletal:        General: No tenderness.     Cervical back: Neck supple.     Right lower leg: Edema present.     Left lower leg: Edema present.  Skin:    General: Skin is warm and dry.  Neurological:     General: No focal deficit present.     Mental Status: He is alert and oriented to person, place, and time.     Gait: Gait normal.    ED Results / Procedures / Treatments   Labs (all labs ordered are listed, but only abnormal results are displayed) Labs Reviewed  BASIC METABOLIC PANEL - Abnormal; Notable for the following components:      Result Value   CO2 35 (*)    Glucose, Bld 130 (*)    All other components within normal limits  CBC - Abnormal; Notable for the following components:   RBC 3.87 (*)    Hemoglobin 11.6 (*)    HCT 35.5 (*)    All other components within normal limits  BRAIN NATRIURETIC PEPTIDE - Abnormal; Notable for the following components:   B Natriuretic Peptide 108.0 (*)    All other components within normal limits  RESP PANEL BY RT-PCR (FLU A&B, COVID) ARPGX2  D-DIMER, QUANTITATIVE  PROTIME-INR  TROPONIN I (HIGH SENSITIVITY)  TROPONIN I (HIGH SENSITIVITY)    EKG EKG Interpretation  Date/Time:  Friday November 20 2020 10:12:20 EDT Ventricular Rate:  101 PR Interval:  122 QRS Duration: 80 QT Interval:  361 QTC Calculation: 440 R Axis:   57 Text Interpretation: Sinus tachycardia Multiform ventricular premature complexes No significant change since last tracing Confirmed by Meridee Score [phone]) on 11/20/2020 10:23:10 AM  Radiology CT Angio Chest PE W/Cm &/Or  Wo Cm  Result Date: 11/20/2020 CLINICAL DATA:  Chest pain. EXAM: CT ANGIOGRAPHY CHEST WITH CONTRAST TECHNIQUE: Multidetector CT  imaging of the chest was performed using the standard protocol during bolus administration of intravenous contrast. Multiplanar CT image reconstructions and MIPs were obtained to evaluate the vascular anatomy. CONTRAST:  25mL OMNIPAQUE IOHEXOL 350 MG/ML SOLN COMPARISON:  April 23, 2018. FINDINGS: Cardiovascular: Satisfactory opacification of the pulmonary arteries to the segmental level. No evidence of pulmonary embolism. Normal heart size. No pericardial effusion. Mediastinum/Nodes: No enlarged mediastinal, hilar, or axillary lymph nodes. Thyroid gland, trachea, and esophagus demonstrate no significant findings. Lungs/Pleura: No pneumothorax or pleural effusion is noted. Mild emphysematous disease is noted. Minimal subsegmental atelectasis is noted in the lingular segment of the left upper lobe. Focal ill-defined opacity measuring 12 x 7 mm is noted in the periphery of the left upper lobe best seen on image number 68 of series 8. This may represent focal inflammation, but malignancy or neoplasm cannot be excluded. Upper Abdomen: No acute abnormality. Musculoskeletal: No chest wall abnormality. No acute or significant osseous findings. Review of the MIP images confirms the above findings. IMPRESSION: No definite evidence of pulmonary embolus. Focal ill-defined opacity measuring 12 x 7 mm is noted in the periphery of the left upper lobe. While this may represent focal inflammation or atelectasis, malignancy cannot be excluded. Follow-up unenhanced CT scan in 3-4 weeks is recommended to ensure resolution or stability. Aortic Atherosclerosis (ICD10-I70.0) and Emphysema (ICD10-J43.9). Electronically Signed   By: Lupita Raider M.D.   On: 11/20/2020 12:42   DG Chest Port 1 View  Result Date: 11/20/2020 CLINICAL DATA:  Left chest pain EXAM: PORTABLE CHEST 1 VIEW COMPARISON:  Chest x-ray dated November 10, 2020 FINDINGS: Cardiac and mediastinal contours are unchanged and within normal limits. Click Redemonstrated  bilateral linear opacities, similar to prior exams and likely due to scarring. No large pleural effusion or pneumothorax. IMPRESSION: No new focal parenchymal opacity. Electronically Signed   By: Allegra Lai MD   On: 11/20/2020 11:30    Procedures Procedures   Medications Ordered in ED Medications  iohexol (OMNIPAQUE) 350 MG/ML injection 80 mL (80 mLs Intravenous Contrast Given 11/20/20 1204)    ED Course  I have reviewed the triage vital signs and the nursing notes.  Pertinent labs & imaging results that were available during my care of the patient were reviewed by me and considered in my medical decision making (see chart for details).  Clinical Course as of 11/20/20 1856  Fri Nov 20, 2020  1019 EKG showing sinus tachycardia with sinus arrhythmia.  Nonspecific ST-T's.  No STEMI. [MB]  1049 Chest x-ray interpreted by me as COPD, no acute infiltrates. [MB]  1317 Reviewed work-up with Dr. Sherene Sires from pulmonology who sent him over.  He thought it was reasonable to let the patient go home, start him on some antibiotics for that finding on the CAT scan.  Reviewed this with the patient he is comfortable with this plan. [MB]    Clinical Course User Index [MB] Terrilee Files, MD   MDM Rules/Calculators/A&P                          This patient complains of left-sided pleuritic chest pain shortness of breath; this involves an extensive number of treatment Options and is a complaint that carries with it a high risk of complications and Morbidity. The differential includes COPD, pneumothorax, PE, ACS, CHF, pleurisy, musculoskeletal, pneumonia  I ordered, reviewed and interpreted labs, which included CBC with normal white count, hemoglobin similar to priors, chemistries with elevated bicarb indicating some CO2 retention, troponins flat, D-dimer negative, COVID and flu testing negative, BNP mildly elevated  I ordered imaging studies which included chest x-ray and CT angio chest and I  independently    visualized and interpreted imaging which showed no evidence of PE.  Does have some inflammatory infectious changes in left upper lobe.  Previous records obtained and reviewed in epic including prior pulmonology notes I consulted Dr. Sherene Sires pulmonology and discussed lab and imaging findings  Critical Interventions: None  After the interventions stated above, I reevaluated the patient and found patient to be breathing fairly comfortably on his regular home oxygen level.  Reviewed work-up with him.  He is comfortable plan for discharge.  We will put him on some doxycycline.  Recommended close follow-up with pulmonology and cardiology.  Return instructions discussed   Final Clinical Impression(s) / ED Diagnoses Final diagnoses:  Pleuritic chest pain    Rx / DC Orders ED Discharge Orders          Ordered    doxycycline (VIBRAMYCIN) 100 MG capsule  2 times daily        11/20/20 1321    Ambulatory referral to Cardiology        11/20/20 1338             Terrilee Files, MD 11/20/20 1858

## 2020-11-20 NOTE — Progress Notes (Signed)
Gerald Williams, male    DOB: Oct 20, 1950    MRN: [id number]   Brief patient profile:  68 yobm quit smoking 08/2013 (pt says 2010)  GOLD III/ group D symptoms  with  better on trelegy vs symbicort with less need for saba but concerned about cost of non-[facility] meds.      History of Present Illness  11/19/2018  Pulmonary/ 1st office eval/Gerald Williams on ACEi and ARB Chief Complaint  Patient presents with   Follow-up    Breathing is overall doing well. He is using his albuterol inhaler 2-3 x per day and has not used his neb.   Dyspnea:  Can walk a half a block on 3lpm but does not check sats walking Cough: none  Sleep: sleeps on cpap/ 3lpm  SABA use: while on trelegy not needing much albuterol  rec It would be preferable if you would take higher doses of diovan and stop lisinopril and the latter causes confusion regarding interpretation of symptoms that look like copd in many cause  The alternative to trelegy would be restarting symbicort using spiriva in combination free thru the [place]   03/11/2019  f/u ov/Gerald Williams re:  GOLD III spirometry / 02 dep  Very poor hfa maint symb 160 2bid /spiriva dpi [facility]  Chief Complaint  Patient presents with   Follow-up    Patient comes in today for increased shortness of breath with exertion. Patient is normally on 3L and has been turning it up to 4-5L when moving around because he is so short of breath. Patient wants to switch home health [facility] companies and wants a home concentrator.  Dyspnea:  hc parking into store = [employer] = can't walk 100 yards even at a slow pace at a flat grade s stopping due to sob   Cough: daytime minimal mucus, sporadic  Sleeping: fine 3 pillows and cpoap SABA use:   02: 3lpm 95% when check  - turns it up to 4  Lpmwalking with sats   95%  - sleeping cpap / 3lpm Rec Plan A = Automatic = Always=   Symbicort 160 Take 2 puffs first thing in am and then another 2 puffs about 12 hours later and spiriva each am  Plan B = Backup (to supplement plan A,  not to replace it) Only use your albuterol inhaler or combivent  as a rescue medication  Plan C = Crisis (instead of Plan B but only if Plan B stops working) - only use your albuterol nebulizer if you first try Plan B and it fails to help > ok to use the nebulizer up to every 4 hours but if start needing it regularly call for immediate appointment   Admit date: 10/29/2020 Discharge date: 11/02/2020  Discharge Diagnoses:  Active Problems:   Chest pain   Angina at rest [facility])   Acute systolic heart failure (HCC)   COPD    Chronic respiratory failure on 4-5 L home O2           Filed Weights    10/29/20 1748 10/31/20 0300  Weight: 79 kg 80.8 kg      History of present illness:  69/M with history of advanced COPD on 5 L home O2, hypertension, dyslipidemia presented to the ED with 2-day history of left-sided chest pain.   [facility] Course:    Atypical chest pain -Ruled out for ACS, chest pain has resolved -EKG nonischemic, high-sensitivity troponins are negative -Left heart cath with normal coronaries   Nonischemic cardiomyopathy  Acute systolic CHF -ECHO noted EF of 35%, cath noted normal coronaries, filling pressures were high on cath, that evening 7/8 -received IV Lasix, then became hypotensive -Cardiology following, gradually uptitrating meds due to hypotension on 7/8 -toprol resumed and started on low dose losartan -Clinically felt to be euvolemic and hence not felt to require standing dose of diuretics at the time of discharge -added low dose lasix 20mg  PRN -Follow-up with Gerald Williams   Hypotension -Overnight following cath on 7/8 evening -I suspect this is secondary to medications, (got Lasix, prazosin, lisinopril and beta-blocker) and sedation.  BP improved now -Clinically do not suspect acute PE -Monitor clinically, antihypertensives held -Restarted Toprol and added low-dose losartan -Blood pressure stable now   AKI -due to hypotension, held  lisinopril -Improving, creatinine 1.4   COPD/chronic respiratory failure On 5 L home O2 at baseline -Quit smoking 10 years ago, stable, continue Combivent and Symb  11/20/2020  post Williams f/u ov/Gerald Williams re: GOLD  3  maint on combivent thru 11/22/2020 and 02 4-8 lpm  Chief Complaint  Patient presents with   Acute Visit    Patient reports that he had procedure for his heart and feels that he is having trouble catching his breath x 2 weeks.   Dyspnea:  2-3 aisles at [facility] and has to stop on 5 lpm with sats down to 70s now reported  Cough: none  Sleeping: bed is flat, sleeps on back with 2 pillows and cpap [facility]  SABA use: combivent bid  02: 4-5 lpm  Covid status:   x 3 vax  Went back to [facility] for what he describes as new onset  L ant peuritic, localized cp that started 2 d p d/c when he had [facility]  7/8 with wedge of 28 >  advil may help some and assoc worse sob since left Williams> returned to  ER dx as "copd exacerbation with chest tightness"  says no better since rx and insists pain in brand new and well localized and only present when tries to breath deeply   No obvious day to day or daytime variability or assoc excess/ purulent sputum or mucus plugs or hemoptysis,  subjective wheeze or overt sinus or hb symptoms.    Also denies any obvious fluctuation of symptoms with weather or environmental changes or other aggravating or alleviating factors except as outlined above   No unusual exposure hx or h/o childhood pna/ asthma or knowledge of premature birth.  Current Allergies, Complete Past Medical History, Past Surgical History, Family History, and Social History were reviewed in [facility] record.  ROS  The following are not active complaints unless bolded Hoarseness, sore throat, dysphagia, dental problems, itching, sneezing,  nasal congestion or discharge of excess mucus or purulent secretions, ear ache,   fever, chills, sweats, unintended wt loss or wt gain, classically    exertional cp,  orthopnea pnd or arm/hand swelling  or leg swelling, presyncope, palpitations, abdominal pain, anorexia, nausea, vomiting, diarrhea  or change in bowel habits or change in bladder habits, change in stools or change in urine, dysuria, hematuria,  rash, arthralgias, visual complaints, headache, numbness, weakness or ataxia or problems with walking or coordination,  change in mood or  memory.        Current Meds- - NOTE:   Unable to verify as accurately reflecting what pt takes    Medication Sig   acetaminophen (TYLENOL) 500 MG tablet Take 1,000 mg by mouth every 6 (six) hours  as needed for mild pain, fever or headache.   albuterol (PROVENTIL) (2.5 MG/3ML) 0.083% nebulizer solution Take 2.5 mg by nebulization every 6 (six) hours as needed for wheezing or shortness of breath.   albuterol (VENTOLIN HFA) 108 (90 Base) MCG/ACT inhaler Inhale 2 puffs into the lungs every 6 (six) hours as needed for shortness of breath or wheezing.   atorvastatin (LIPITOR) 80 MG tablet Take 80 mg by mouth daily.   budesonide-formoterol (SYMBICORT) 160-4.5 MCG/ACT inhaler Inhale 2 puffs into the lungs in the morning and at bedtime.   furosemide (LASIX) 20 MG tablet Take 1 tablet (20 mg total) by mouth every other day as needed (for >2 lbs weight gain overnight or swelling).   glucose blood (COOL BLOOD GLUCOSE TEST STRIPS) test strip Used to test blood sugar x2 daily---diagnosis code r73.03--for one touch verio flex   guaiFENesin (MUCINEX) 600 MG 12 hr tablet Take 600 mg by mouth 2 (two) times daily.   ipratropium (ATROVENT) 0.03 % nasal spray Place 1 spray into both nostrils every 8 (eight) hours as needed for rhinitis.   Ipratropium-Albuterol (COMBIVENT) 20-100 MCG/ACT AERS respimat Inhale 1 puff into the lungs 2 (two) times daily.   Lancets MISC Used to test blood sugar x2 daily---diagnosis code r73.03--for one touch verio flex   losartan (COZAAR) 25 MG tablet Take 1 tablet (25 mg total) by mouth daily.    metoprolol succinate (TOPROL-XL) 50 MG 24 hr tablet Take 25 mg by mouth daily. Take with or immediately following a meal.   mirtazapine (REMERON) 30 MG tablet Take 30 mg by mouth at bedtime.   OXYGEN Inhale 4-5 L into the lungs continuous.             Objective:       11/20/2020       179   03/11/19 176 lb 9.6 oz (80.1 kg)  11/19/18 166 lb 9.6 oz (75.6 kg)  10/24/18 160 lb (72.6 kg)     Vital signs reviewed  11/20/2020  - Note at rest 02 sats  100% on 5lpm    General appearance:    chronically ill ambulatory bm    HEENT : pt wearing mask not removed for exam due to covid -19 concerns.    NECK :  without JVD/Nodes/TM/ nl carotid upstrokes bilaterally   LUNGS: no acc muscle use,  Mod barrel  contour chest wall with bilateral  Distant bs s audible wheeze and  without cough on insp or exp maneuvers and mod  Hyperresonant  to  percussion bilaterally     CV:  RRR  no s3 or murmur or increase in P2, and 1+ pitting  edema   ABD:  soft and nontender with pos mid insp Hoover's  in the supine position. No bruits or organomegaly appreciated, bowel sounds nl  MS:     ext warm without deformities, calf tenderness, cyanosis or clubbing No obvious joint restrictions   SKIN: warm and dry without lesions    NEURO:  alert, approp, nl sensorium with  no motor or cerebellar deficits apparent.           I personally reviewed images and agree with radiology impression as follows:  CXR:   portable 11/10/20  1. Chronic bronchial thickening in bilateral lung scarring. 2. No acute findings.     Assessment

## 2020-11-20 NOTE — Assessment & Plan Note (Signed)
Quit smoking 2015  - Spirometry 11/19/2018  FEV1 1.00 (36%)  Ratio 0.49  With no response  To saba on ? Prior to test - 03/11/2019  After extensive coaching inhaler device,  effectiveness  < 25% (way too fast, way short Ti, double trigger)  -  50% after coaching   - The proper method of use, as well as anticipated side effects, of a metered-dose inhaler were discussed and demonstrated to the patient using teach back method.   May be good candidate for breztri if [facility] will pay for it, but for now rec resume symbicort 160 2bid with approp saba:  I spent extra time with pt today reviewing appropriate use of albuterol for prn use on exertion with the following points: 1) saba is for relief of sob that does not improve by walking a slower pace or resting but rather if the pt does not improve after trying this first. 2) If the pt is convinced, as many are, that saba helps recover from activity faster then it's easy to tell if this is the case by re-challenging : ie stop, take the inhaler, then p 5 minutes try the exact same activity (intensity of workload) that just caused the symptoms and see if they are substantially diminished or not after saba 3) if there is an activity that reproducibly causes the symptoms, try the saba 15 min before the activity on alternate days   If in fact the saba really does help, then fine to continue to use it prn but advised may need to look closer at the maintenance regimen being used to achieve better control of airways disease with exertion.

## 2020-11-20 NOTE — Discharge Instructions (Addendum)
You were sent in from your lung specialist office for evaluation of left-sided chest pain.  There was no evidence of heart attack.  You had a CAT scan of your chest that did not show a blood clot.  There was possible inflammatory or infection in the left upper lung.  Pulmonology is recommending you go on some antibiotics and follow-up with them.  You can still use Tylenol and ibuprofen for pain.  Return to the emergency department if any worsening or concerning symptoms

## 2020-11-20 NOTE — Patient Instructions (Signed)
Go to [facility] ER further eval of L sided chest pain when you take a deep breath.

## 2020-11-20 NOTE — ED Triage Notes (Signed)
Pt arrives POV  from pulmonologist appointment. Pulmonologist would like to get pt ruled out for blood clot due to chest pain.

## 2020-12-01 ENCOUNTER — Telehealth: Payer: Self-pay | Admitting: Internal Medicine

## 2020-12-01 NOTE — Telephone Encounter (Signed)
Fine with me but [facility] may not be able to get the documentation from the [facility] needed to do what he's asking for   Could certainly refer for best fit for ambulatory 02 and let [facility] work out best option.

## 2020-12-01 NOTE — Telephone Encounter (Signed)
pt came into office bc he needs a new respirator that goes up higher than 5 due to not sleeping well. Also states that [facility] isnt giving him enough o2 to make it thru which has been limiting his quality of life.  Pt uses about 36 Dtanks a week. [phone]

## 2020-12-01 NOTE — Telephone Encounter (Signed)
Called and spoke with pt and he stated that he is very active and does not like to sit at home all the time.  He stated that [facility] cannot provide him with the amount of oxygen that he needs to be able to live a productive life.  He stated that he needs about 36 tanks per month and [facility] cannot provide this for him.    He stated that he did a sleep study last night at the [place] He did use 7 liters of oxygen and he stated that this worked very well for him.  He stated that his concentrator that he has at home will not go over 5 liters and he is wanting to know if MW can get him one that will go over 5 liters to use at night.   He stated that he is using 6 liters of oxygen when he is going during the day.  [facility] that [facility] at [facility].  He wanted MW to be aware of this.  MW please advise. Thanks

## 2020-12-02 ENCOUNTER — Ambulatory Visit (INDEPENDENT_AMBULATORY_CARE_PROVIDER_SITE_OTHER): Payer: No Typology Code available for payment source | Admitting: Podiatry

## 2020-12-02 ENCOUNTER — Other Ambulatory Visit: Payer: Self-pay

## 2020-12-02 DIAGNOSIS — E119 Type 2 diabetes mellitus without complications: Secondary | ICD-10-CM

## 2020-12-02 DIAGNOSIS — E0843 Diabetes mellitus due to underlying condition with diabetic autonomic (poly)neuropathy: Secondary | ICD-10-CM | POA: Diagnosis not present

## 2020-12-02 NOTE — Progress Notes (Signed)
   HPI: 70 y.o. male presenting today as a new patient referral from the [place] for routine diabetic foot evaluation.  Patient states that he has no pain or complications associated to his feet other than dry skin which he uses Apetigen for.  He presents for further treatment and evaluation  Past Medical History:  Diagnosis Date   Emphysema    Erectile dysfunction    Hyperlipemia    Hypertension    Polysubstance abuse (HCC)    stopped using crack/cocaine December 4th 20-14   PTSD (post-traumatic stress disorder)    Sleep apnea      Physical Exam: General: The patient is alert and oriented x3 in no acute distress.  Dermatology: Skin is warm, dry and supple bilateral lower extremities. Negative for open lesions or macerations.  Hyperkeratosis of skin with peeling noted especially to the heels  Vascular: Palpable pedal pulses bilaterally. No edema or erythema noted. Capillary refill within normal limits.  Neurological: Epicritic and protective threshold grossly intact bilaterally.   Musculoskeletal Exam: Range of motion within normal limits to all pedal and ankle joints bilateral. Muscle strength 5/5 in all groups bilateral.    Assessment: 1.  Diabetes mellitus; uncomplicated 2.  Hyperkeratosis peeling of skin bilateral heels   Plan of Care:  1. Patient evaluated.  2.  Comprehensive diabetic foot exam performed today.  Within normal limits 3.  Recommend good supportive shoes and sneakers 4.  Recommend daily foot lotion 5.  Return to clinic annually      Felecia Shelling, DPM [facility]  Dr. Felecia Shelling, DPM    [address] [place], [place] [address]                Office [phone]  Fax [phone]

## 2020-12-02 NOTE — Telephone Encounter (Signed)
LMTCB for the pt 

## 2020-12-11 ENCOUNTER — Encounter: Payer: Self-pay | Admitting: Cardiovascular Disease

## 2020-12-11 ENCOUNTER — Ambulatory Visit: Payer: Medicare Other | Admitting: Cardiovascular Disease

## 2020-12-11 ENCOUNTER — Telehealth: Payer: Self-pay | Admitting: Internal Medicine

## 2020-12-11 ENCOUNTER — Other Ambulatory Visit: Payer: Self-pay

## 2020-12-11 VITALS — BP 138/82 | HR 79 | Ht 65.0 in | Wt 175.6 lb

## 2020-12-11 DIAGNOSIS — R072 Precordial pain: Secondary | ICD-10-CM | POA: Diagnosis not present

## 2020-12-11 DIAGNOSIS — J9611 Chronic respiratory failure with hypoxia: Secondary | ICD-10-CM

## 2020-12-11 DIAGNOSIS — J449 Chronic obstructive pulmonary disease, unspecified: Secondary | ICD-10-CM

## 2020-12-11 DIAGNOSIS — I5022 Chronic systolic (congestive) heart failure: Secondary | ICD-10-CM

## 2020-12-11 DIAGNOSIS — I10 Essential (primary) hypertension: Secondary | ICD-10-CM | POA: Diagnosis not present

## 2020-12-11 MED ORDER — SPIRONOLACTONE 25 MG PO TABS: 25.0000 mg | ORAL_TABLET | Freq: Every day | ORAL | 1 refills | Status: DC

## 2020-12-11 MED ORDER — METOPROLOL SUCCINATE ER 50 MG PO TB24: 50.0000 mg | ORAL_TABLET | Freq: Every day | ORAL | 1 refills | Status: DC

## 2020-12-11 MED ORDER — EMPAGLIFLOZIN 10 MG PO TABS: 10.0000 mg | ORAL_TABLET | Freq: Every day | ORAL | 1 refills | Status: DC

## 2020-12-11 MED ORDER — DAPAGLIFLOZIN PROPANEDIOL 10 MG PO TABS: 10.0000 mg | ORAL_TABLET | Freq: Every day | ORAL | 1 refills | Status: DC

## 2020-12-11 NOTE — Patient Instructions (Signed)
Medication Instructions:  Stop Losartan  Stop HCTZ Start Entresto 24-26 mg twice daily (use samples for 2 weeks)  Start Aldactone 25 mg daily  Start Farxiga 10 mg daily  Increase Metoprolol 50 mg daily   *If you need a refill on your cardiac medications before your next appointment, please call your pharmacy*   Follow-Up: At [facility], you and your health needs are our priority.  As part of our continuing mission to provide you with exceptional heart care, we have created designated Provider Care Teams.  These Care Teams include your primary Cardiologist (physician) and Advanced Practice Providers (APPs -  Physician Assistants and Nurse Practitioners) who all work together to provide you with the care you need, when you need it.  We recommend signing up for the patient portal called "MyChart".  Sign up information is provided on this After Visit Summary.  MyChart is used to connect with patients for Virtual Visits (Telemedicine).  Patients are able to view lab/test results, encounter notes, upcoming appointments, etc.  Non-urgent messages can be sent to your provider as well.   To learn more about what you can do with MyChart, go to [email or web address].    Your next appointment:   4 month(s)  The format for your next appointment:   In Person  Provider:   Lennie Odor, MD   Other Instructions PHARMD follow up titration of Entresto in 2 weeks.

## 2020-12-11 NOTE — Progress Notes (Signed)
Cardiology Office Note:   Date:  12/11/2020  NAME:  Gerald Williams    MRN: [id number] DOB:  1951-02-08   PCP:  [facility]  Cardiologist:  Reatha Harps, MD   Referring MD: Terrilee Files, MD   Chief Complaint  Patient presents with   Chest Pain    History of Present Illness:   Gerald Williams is a 70 y.o. male with a hx of CHF, HTN, HLD, pHTN who is being seen today for the evaluation of chest pain at the request of Terrilee Files, MD. Recent admission for CHF. Non-ischemic CM found. Normal LHC. Seen by Pulmonary 2 weeks ago and sent to ER for PE study that was negative. He reports he was having right-sided pleuritic chest pain.  Sent to the ER for PE study.  This was negative.  No further recurrence.  He was diagnosed with congestive heart failure in July of this year.  Needs further titration of guideline directed medical therapy.  Weights are stable.  BP 138/82.  No significant CAD found on heart cath.  He does have COPD and wears oxygen.  Follows with pulmonary.  He reports he would like to come see [place] for cardiology purposes.  EKG in office demonstrates sinus rhythm with LVH.  He denies any chest pain.  He does get short of breath.  He does report some edema in his legs.  He reports a prior history of cocaine use.  Not using in nearly 10 years.  He does have COPD.  He quit smoking years ago.  50-pack-year history.  He does have hypertension hyperlipidemia.  He takes a statin.  He is not diabetic.  Recent TSH was normal.  He is separated.  He reports he has 4 children and 11 grandchildren.  Denies any major symptoms in the office.  He takes Lasix as needed for lower extremity edema.  Problem List Systolic HF -EF 35%, non-ischemic -Normal LHC 10/30/2020 2. Pulmonary hypertension -PCWP 28 (Group 2) 3. COPD 4. HTN 5. HLD -T chol 244, HDL 79, LDL 147, TG 92  Past Medical History: Past Medical History:  Diagnosis Date   CHF (congestive heart failure) (HCC)     Emphysema    Erectile dysfunction    Hyperlipemia    Hypertension    Polysubstance abuse (HCC)    stopped using crack/cocaine December 4th 20-14   PTSD (post-traumatic stress disorder)    Sleep apnea     Past Surgical History: Past Surgical History:  Procedure Laterality Date   CARDIAC CATHETERIZATION     CYSTOSCOPY WITH INSERTION OF UROLIFT N/A 08/03/2020   Procedure: CYSTOSCOPY WITH INSERTION OF UROLIFT;  Surgeon: Marcine Matar, MD;  Location: [facility];  Service: Urology;  Laterality: N/A;  30 MINS   LEFT HEART CATHETERIZATION WITH CORONARY ANGIOGRAM N/A 07/15/2014   Procedure: LEFT HEART CATHETERIZATION WITH CORONARY ANGIOGRAM;  Surgeon: Lennette Bihari, MD;  Location: [facility] CATH LAB;  Service: Cardiovascular;  Laterality: N/A;   RIGHT/LEFT HEART CATH AND CORONARY ANGIOGRAPHY N/A 10/30/2020   Procedure: RIGHT/LEFT HEART CATH AND CORONARY ANGIOGRAPHY;  Surgeon: Iran Ouch, MD;  Location: [facility] INVASIVE CV LAB;  Service: Cardiovascular;  Laterality: N/A;    Current Medications: Current Meds  Medication Sig   acetaminophen (TYLENOL) 500 MG tablet Take 1,000 mg by mouth every 6 (six) hours as needed for mild pain, fever or headache.   albuterol (PROVENTIL) (2.5 MG/3ML) 0.083% nebulizer solution Take 2.5 mg by nebulization every 6 (six) hours as  needed for wheezing or shortness of breath.   albuterol (VENTOLIN HFA) 108 (90 Base) MCG/ACT inhaler Inhale 2 puffs into the lungs every 6 (six) hours as needed for shortness of breath or wheezing.   atorvastatin (LIPITOR) 80 MG tablet Take 80 mg by mouth daily.   budesonide-formoterol (SYMBICORT) 160-4.5 MCG/ACT inhaler Take 2 puffs first thing in am and then another 2 puffs about 12 hours later.   dapagliflozin propanediol (FARXIGA) 10 MG TABS tablet Take 1 tablet (10 mg total) by mouth daily before breakfast.   doxycycline (VIBRAMYCIN) 100 MG capsule Take 1 capsule (100 mg total) by mouth 2 (two) times daily.   DULoxetine (CYMBALTA) 60 MG  capsule TAKE ONE CAPSULE BY MOUTH TWO TIMES A DAY FOR POSTTRAUMATIC STRESS DISORDER AND DEPRESSION   furosemide (LASIX) 20 MG tablet Take 1 tablet (20 mg total) by mouth every other day as needed (for >2 lbs weight gain overnight or swelling).   glucose blood (COOL BLOOD GLUCOSE TEST STRIPS) test strip Used to test blood sugar x2 daily---diagnosis code r73.03--for one touch verio flex   guaiFENesin (MUCINEX) 600 MG 12 hr tablet Take 600 mg by mouth 2 (two) times daily.   hydrochlorothiazide (HYDRODIURIL) 12.5 MG tablet Take 12.5 mg by mouth daily.   ipratropium (ATROVENT) 0.03 % nasal spray Place 1 spray into both nostrils every 8 (eight) hours as needed for rhinitis.   Ipratropium-Albuterol (COMBIVENT) 20-100 MCG/ACT AERS respimat Inhale 1 puff into the lungs 2 (two) times daily.   Lancets MISC Used to test blood sugar x2 daily---diagnosis code r73.03--for one touch verio flex   mirtazapine (REMERON) 30 MG tablet Take 30 mg by mouth at bedtime.   OXYGEN Inhale 4-5 L into the lungs continuous.   spironolactone (ALDACTONE) 25 MG tablet Take 1 tablet (25 mg total) by mouth daily.   [DISCONTINUED] empagliflozin (JARDIANCE) 10 MG TABS tablet Take 1 tablet (10 mg total) by mouth daily before breakfast.   [DISCONTINUED] losartan (COZAAR) 50 MG tablet Take 1 tablet by mouth daily.   [DISCONTINUED] metoprolol succinate (TOPROL-XL) 50 MG 24 hr tablet Take 25 mg by mouth daily. Take with or immediately following a meal.     Allergies:    Budeson-glycopyrrol-formoterol, Gabapentin, Nortriptyline, Oxcarbazepine, Tiotropium, Tiotropium bromide-olodaterol, and Olodaterol   Social History: Social History   Socioeconomic History   Marital status: Legally Separated    Spouse name: Not on file   Number of children: 4   Years of education: Not on file   Highest education level: Not on file  Occupational History   Occupation: Investment banker, operational - retired   Occupation: Careers adviser  Tobacco Use   Smoking status: Former     Packs/day: 1.50    Years: 50.00    Pack years: 75.00    Types: Cigarettes    Start date: 04/26/1963    Quit date: 09/22/2013    Years since quitting: 7.2   Smokeless tobacco: Never   Tobacco comments:    patient using the patch, losenges, and medication  Vaping Use   Vaping Use: Never used  Substance and Sexual Activity   Alcohol use: Yes    Comment: He was drinking 3-4 drinks nightly x 40 years, quit in 2014.     Drug use: No    Comment: history of crack use and drug tested weekly at the [place]   Sexual activity: Yes  Other Topics Concern   Not on file  Social History Narrative    Education: 14 years and 20 years  of [employer] and [employer]. Recently married.    Social Determinants of Health   Financial Resource Strain: Not on file  Food Insecurity: Not on file  Transportation Needs: Not on file  Physical Activity: Not on file  Stress: Not on file  Social Connections: Not on file     Family History: The patient's family history includes Alcohol abuse in his brother; Cancer - Other in his sister; Diabetes in his mother; Healthy in his daughter and son; Heart attack in his brother and father; Heart disease in his father.  ROS:   All other ROS reviewed and negative. Pertinent positives noted in the HPI.     EKGs/Labs/Other Studies Reviewed:   The following studies were personally reviewed by me today:  EKG:  EKG is ordered today.  The ekg ordered today demonstrates SR with LVH, and was personally reviewed by me.   LHC 10/30/2020 1.  Normal coronary arteries. 2.  Mildly to moderately reduced LV systolic function with an EF of 40% with global hypokinesis. 3.  Right heart catheterization showed moderately elevated filling pressures, moderate pulmonary hypertension and mildly reduced cardiac output.  Interpretation of right heart numbers was somewhat difficult due to severe respiratory variations and patient's difficulty with breath-hold.  TTE 7/720222  1. Left ventricular ejection fraction,  by estimation, is 35%. The left  ventricle has moderately decreased function. The left ventricle  demonstrates global hypokinesis. Left ventricular diastolic parameters are  consistent with Grade I diastolic  dysfunction (impaired relaxation).   2. Right ventricular systolic function is mildly reduced. The right  ventricular size is normal. Tricuspid regurgitation signal is inadequate  for assessing PA pressure.   3. The mitral valve is normal in structure. No evidence of mitral valve  regurgitation. No evidence of mitral stenosis.   4. The aortic valve was not well visualized. Aortic valve regurgitation  is not visualized. No aortic stenosis is present.   5. Aortic dilatation noted. There is mild dilatation of the aortic root,  measuring 37 mm.   6. The inferior vena cava is normal in size with greater than 50%  respiratory variability, suggesting right atrial pressure of 3 mmHg.   7. Technically difficult study with poor acoustic windows.   Recent Labs: 10/29/2020: ALT 16 11/01/2020: TSH 0.396 11/20/2020: B Natriuretic Peptide 108.0; BUN 20; Creatinine, Ser 1.18; Hemoglobin 11.6; Platelets 217; Potassium 3.7; Sodium 143   Recent Lipid Panel    Component Value Date/Time   CHOL 244 (H) 10/29/2020 1250   TRIG 92 10/29/2020 1250   HDL 79 10/29/2020 1250   CHOLHDL 3.1 10/29/2020 1250   VLDL 18 10/29/2020 1250   LDLCALC 147 (H) 10/29/2020 1250    Physical Exam:   VS:  BP 138/82 (BP Location: Left Arm, Patient Position: Sitting, Cuff Size: Normal)   Pulse 79   Ht 5\' 5"  (1.651 m)   Wt 175 lb 9.6 oz (79.7 kg)   SpO2 100%   BMI 29.22 kg/m    Wt Readings from Last 3 Encounters:  12/11/20 175 lb 9.6 oz (79.7 kg)  11/20/20 179 lb 3.2 oz (81.3 kg)  10/31/20 178 lb 2.1 oz (80.8 kg)    General: Well nourished, well developed, in no acute distress Head: Atraumatic, normal size  Eyes: PEERLA, EOMI  Neck: Supple, no JVD Endocrine: No thryomegaly Cardiac: Normal S1, S2; RRR; no murmurs,  rubs, or gallops Lungs: Clear to auscultation bilaterally, no wheezing, rhonchi or rales  Abd: Soft, nontender, no hepatomegaly  Ext: No edema, pulses 2+  Musculoskeletal: No deformities, BUE and BLE strength normal and equal Skin: Warm and dry, no rashes   Neuro: Alert and oriented to person, place, time, and situation, CNII-XII grossly intact, no focal deficits  Psych: Normal mood and affect   ASSESSMENT:   Gerald Williams is a 70 y.o. male who presents for the following: 1. Precordial pain   2. Chronic systolic heart failure (HCC)   3. Primary hypertension     PLAN:   1. Precordial pain -Noncardiac chest pain.  Likely pleurisy.  Symptoms have resolved.  CT PE study negative.  Normal left heart cath.  EKG in office nonischemic.  No further work-up indicated.  2. Chronic systolic heart failure (HCC) -Diagnosed with congestive heart failure in July 2022.  EF 35%.  LV wall thickness is normal.  No regional wall motion normalities.  Left heart catheterization showed no CAD.  This is a nonischemic cardiomyopathy.  Suspect this is related to hypertension. -We will stop his losartan.  Start Entresto 24-26 mg twice daily. -Start Farxiga 10 mg daily. -Increase metoprolol succinate to 50 mg daily.  Heart rate 79 in office. -We will add Aldactone 25 mg daily. -He will continue with Lasix 20 mg as needed.  Volume status is acceptable today. -He will come back in 2 weeks for further titration of guideline directed medical therapy.  He will see me back in 4 months.  3. Primary hypertension -As above.  Disposition: Return in about 4 months (around 04/12/2021).  Medication Adjustments/Labs and Tests Ordered: Current medicines are reviewed at length with the patient today.  Concerns regarding medicines are outlined above.  Orders Placed This Encounter  Procedures   EKG 12-Lead   Meds ordered this encounter  Medications   metoprolol succinate (TOPROL-XL) 50 MG 24 hr tablet    Sig: Take 1  tablet (50 mg total) by mouth daily. Take with or immediately following a meal.    Dispense:  90 tablet    Refill:  1   spironolactone (ALDACTONE) 25 MG tablet    Sig: Take 1 tablet (25 mg total) by mouth daily.    Dispense:  90 tablet    Refill:  1   DISCONTD: empagliflozin (JARDIANCE) 10 MG TABS tablet    Sig: Take 1 tablet (10 mg total) by mouth daily before breakfast.    Dispense:  90 tablet    Refill:  1   dapagliflozin propanediol (FARXIGA) 10 MG TABS tablet    Sig: Take 1 tablet (10 mg total) by mouth daily before breakfast.    Dispense:  90 tablet    Refill:  1    DO NOT FILL JARDIANCE    Patient Instructions  Medication Instructions:  Stop Losartan  Stop HCTZ Start Entresto 24-26 mg twice daily (use samples for 2 weeks)  Start Aldactone 25 mg daily  Start Farxiga 10 mg daily  Increase Metoprolol 50 mg daily   *If you need a refill on your cardiac medications before your next appointment, please call your pharmacy*   Follow-Up: At [facility], you and your health needs are our priority.  As part of our continuing mission to provide you with exceptional heart care, we have created designated Provider Care Teams.  These Care Teams include your primary Cardiologist (physician) and Advanced Practice Providers (APPs -  Physician Assistants and Nurse Practitioners) who all work together to provide you with the care you need, when you need it.  We recommend signing up for the patient portal called "MyChart".  Sign up information is provided on this After Visit Summary.  MyChart is used to connect with patients for Virtual Visits (Telemedicine).  Patients are able to view lab/test results, encounter notes, upcoming appointments, etc.  Non-urgent messages can be sent to your provider as well.   To learn more about what you can do with MyChart, go to [email or web address].    Your next appointment:   4 month(s)  The format for your next appointment:   In Person  Provider:    Lennie Odor, MD   Other Instructions PHARMD follow up titration of Entresto in 2 weeks.    Signed, Lenna Gilford. Flora Lipps, MD, [facility]  [facility]  [address] [place], [place] [address] [phone]  12/11/2020 10:32 AM

## 2020-12-12 NOTE — Telephone Encounter (Signed)
THis is from the phone note dated 85/10:  Called and spoke with pt and he stated that he is very active and does not like to sit at home all the time.  He stated that [facility] cannot provide him with the amount of oxygen that he needs to be able to live a productive life.  He stated that he needs about 36 tanks per month and [facility] cannot provide this for him.     He stated that he did a sleep study last night at the [place] He did use 7 liters of oxygen and he stated that this worked very well for him.  He stated that his concentrator that he has at home will not go over 5 liters and he is wanting to know if MW can get him one that will go over 5 liters to use at night.    He stated that he is using 6 liters of oxygen when he is going during the day.  [facility].  He wanted MW to be aware of this.  MW please advise. Thanks    Fine with me but [facility] may not be able to get the documentation from the [place] needed to do what he's asking for    Could certainly refer for best fit for ambulatory 02 and let [facility] work out best option.  So pt is wanting all of this to go through his medicare and not the [place].   MW please advise if you are ok with this and if we can order requested orders from the patient.  Thanks

## 2020-12-12 NOTE — Telephone Encounter (Signed)
Ok to order thru medicare unless the verification is thru the [place] for exactly what he needs as we would need to get a copy of the studies that justify it (if required by medicare to do so, which may in fact be the case )

## 2020-12-15 ENCOUNTER — Ambulatory Visit: Payer: Medicare Other | Admitting: Allergy & Immunology

## 2020-12-15 ENCOUNTER — Telehealth (HOSPITAL_COMMUNITY): Payer: Self-pay | Admitting: *Deleted

## 2020-12-15 NOTE — Telephone Encounter (Signed)
Called and spoke with pt letting him know the info stated by Dr. Sherene Sires and he verbalized understanding.  Pt said that he does want to have order for a larger concentrator to be sent to [facility] instead of the [place].  Pt said that he had a [facility] and said during the study, he was started wearing 5L O2 but said that they had to bump him up to 7L O2 and said when on the 7L, he was able to sleep soundly.  Pt said that he has not been using the CPAP, just the O2.  Stated to pt that we will probably need to get a copy of that study from the [place] and he said that he was going to be going to the [place] next week.  Order has been placed for pt to receive larger concentrator. Nothing further needed.

## 2020-12-15 NOTE — Telephone Encounter (Signed)
Received VA Authorization from care in the community for this pt to participate in Pulmonary rehab. Unclear from authorization that has [facility] listed in several places with [facility] address.  Called and spoke to Gerald Williams.  He clarified that he lives in [place]  (although the address is different from what is is Epic) and would like to participate here at [facility] cone. Advised pt that there is a 2-3 month wait list.  Verbalized understanding and ask if there is anyway to get in sooner please let him know. Alanson Aly, BSN Cardiac and Emergency planning/management officer

## 2020-12-16 ENCOUNTER — Telehealth: Payer: Self-pay | Admitting: Cardiovascular Disease

## 2020-12-16 NOTE — Telephone Encounter (Signed)
Patient started taking new medications on Monday. He had pain about a hour after taken medication each day. The patient has not taken the medication today and has not had any pain. Dull pain across chest, lasted 60 - 90 minutes, patient took medication without/before eating anything both times he developed pain, the pain would subside after he ate something. No medications taken to alleviate pain. No BP or HR reading available. The patient has not missed any medications other than the [place] which he hasn't taken today and wants to wait for recommendation before he takes again. ED precautions given.    Patient states he has samples and has called his pharmacy and can not afford to buy the medication.

## 2020-12-16 NOTE — Telephone Encounter (Signed)
Pt c/o medication issue:  1. Name of Medication: Sherryll Burger and Farxiga  2. How are you currently taking this medication (dosage and times per day)?  1 time a day  3. Are you having a reaction (difficulty breathing--STAT)? no  4. What is your medication issue? Causing his chest  to hurt    Patient says he would like  some other medicine to replace both of these medicine. He says they are both too expensive.

## 2020-12-16 NOTE — Telephone Encounter (Signed)
Spoke with patient.  He has concerns regarding chest pain in the mornings after taking the [place] together.  Has not taken evening dose of Entresto for several days.  Also worried about cost, was told copay by his pharmacy and not within his budget.    Advised patient to continue Farxiga 10 mg daily, but take with breakfast.  He should go back on losartan once daily for now and stop Entresto.  After a week, if he is feeling fine, then we know Marcelline Deist not the problem.  At that point he should stop losartan and re-challenge with Entresto bid.    As for cost, he is following up with Dr. Flora Lipps in 2 weeks.  Advised that by then we'll know if he can tolerate either/both meds.  We can give patient assistance paperwork to him at that time.   Patient agreeable with plan.

## 2020-12-28 NOTE — Progress Notes (Signed)
Cardiology Office Note:   Date:  12/29/2020  NAME:  Gerald Williams    MRN: [id number] DOB:  11-11-1950   PCP:  [facility]  Cardiologist:  Reatha Harps, MD  Electrophysiologist:  None   Referring MD: [facility]*   Chief Complaint  Patient presents with   Congestive Heart Failure   History of Present Illness:   Gerald Williams is a 70 y.o. male with a hx of systolic HF, COPD, HTN, HLD who presents for follow-up. Started on entresto but cost is an issue.  He reports he cannot afford Entresto or [place].  Also reports his breathing was worsened by them.  His weight is stable but he feels he has extra fluid on board.  We discussed starting losartan as an acceptable alternative to [facility].  He is on metoprolol and Aldactone.  He is taking HCTZ which we will stop.  He will continue with Lasix 20 mg daily with potassium supplementation.  He unfortunately reports he would not like to [employer].  He would like to just continue with more formal options.  I think this is reasonable.  Denies any chest pain or significant shortness of breath.  Remains on oxygen therapy.  Problem List Systolic HF -EF 35%, non-ischemic -Normal LHC 10/30/2020 2. Pulmonary hypertension -PCWP 28 (Group 2) 3. COPD 4. HTN 5. HLD -T chol 244, HDL 79, LDL 147, TG 92  Past Medical History: Past Medical History:  Diagnosis Date   CHF (congestive heart failure) (HCC)    Emphysema    Erectile dysfunction    Hyperlipemia    Hypertension    Polysubstance abuse (HCC)    stopped using crack/cocaine December 4th 20-14   PTSD (post-traumatic stress disorder)    Sleep apnea     Past Surgical History: Past Surgical History:  Procedure Laterality Date   CARDIAC CATHETERIZATION     CYSTOSCOPY WITH INSERTION OF UROLIFT N/A 08/03/2020   Procedure: CYSTOSCOPY WITH INSERTION OF UROLIFT;  Surgeon: Marcine Matar, MD;  Location: [facility] ORS;  Service: Urology;  Laterality: N/A;  30 MINS   LEFT  HEART CATHETERIZATION WITH CORONARY ANGIOGRAM N/A 07/15/2014   Procedure: LEFT HEART CATHETERIZATION WITH CORONARY ANGIOGRAM;  Surgeon: Lennette Bihari, MD;  Location: [facility] CATH LAB;  Service: Cardiovascular;  Laterality: N/A;   RIGHT/LEFT HEART CATH AND CORONARY ANGIOGRAPHY N/A 10/30/2020   Procedure: RIGHT/LEFT HEART CATH AND CORONARY ANGIOGRAPHY;  Surgeon: Iran Ouch, MD;  Location: [facility] INVASIVE CV LAB;  Service: Cardiovascular;  Laterality: N/A;    Current Medications: Current Meds  Medication Sig   acetaminophen (TYLENOL) 500 MG tablet Take 1,000 mg by mouth every 6 (six) hours as needed for mild pain, fever or headache.   albuterol (PROVENTIL) (2.5 MG/3ML) 0.083% nebulizer solution Take 2.5 mg by nebulization every 6 (six) hours as needed for wheezing or shortness of breath.   albuterol (VENTOLIN HFA) 108 (90 Base) MCG/ACT inhaler Inhale 2 puffs into the lungs every 6 (six) hours as needed for shortness of breath or wheezing.   atorvastatin (LIPITOR) 80 MG tablet Take 80 mg by mouth daily.   budesonide-formoterol (SYMBICORT) 160-4.5 MCG/ACT inhaler Take 2 puffs first thing in am and then another 2 puffs about 12 hours later.   doxycycline (VIBRAMYCIN) 100 MG capsule Take 1 capsule (100 mg total) by mouth 2 (two) times daily.   DULoxetine (CYMBALTA) 60 MG capsule TAKE ONE CAPSULE BY MOUTH TWO TIMES A DAY FOR POSTTRAUMATIC STRESS DISORDER AND DEPRESSION  furosemide (LASIX) 20 MG tablet Take 1 tablet (20 mg total) by mouth daily.   glucose blood (COOL BLOOD GLUCOSE TEST STRIPS) test strip Used to test blood sugar x2 daily---diagnosis code r73.03--for one touch verio flex   ipratropium (ATROVENT) 0.03 % nasal spray Place 1 spray into both nostrils every 8 (eight) hours as needed for rhinitis.   Ipratropium-Albuterol (COMBIVENT) 20-100 MCG/ACT AERS respimat Inhale 1 puff into the lungs 2 (two) times daily.   Lancets MISC Used to test blood sugar x2 daily---diagnosis code r73.03--for one touch  verio flex   metoprolol succinate (TOPROL-XL) 50 MG 24 hr tablet Take 1 tablet (50 mg total) by mouth daily. Take with or immediately following a meal.   mirtazapine (REMERON) 30 MG tablet Take 30 mg by mouth at bedtime.   OXYGEN Inhale 4-5 L into the lungs continuous.   potassium chloride SA (KLOR-CON) 10 MEQ tablet Take 1 tablet (10 mEq total) by mouth daily.   spironolactone (ALDACTONE) 25 MG tablet Take 1 tablet (25 mg total) by mouth daily.   [DISCONTINUED] furosemide (LASIX) 20 MG tablet Take 1 tablet (20 mg total) by mouth every other day as needed (for >2 lbs weight gain overnight or swelling).   [DISCONTINUED] hydrochlorothiazide (HYDRODIURIL) 12.5 MG tablet Take 12.5 mg by mouth daily.     Allergies:    Budeson-glycopyrrol-formoterol, Gabapentin, Nortriptyline, Oxcarbazepine, Tiotropium, Tiotropium bromide-olodaterol, and Olodaterol   Social History: Social History   Socioeconomic History   Marital status: Legally Separated    Spouse name: Not on file   Number of children: 4   Years of education: Not on file   Highest education level: Not on file  Occupational History   Occupation: Investment banker, operational - retired   Occupation: Careers adviser  Tobacco Use   Smoking status: Former    Packs/day: 1.50    Years: 50.00    Pack years: 75.00    Types: Cigarettes    Start date: 04/26/1963    Quit date: 09/22/2013    Years since quitting: 7.2   Smokeless tobacco: Never   Tobacco comments:    patient using the patch, losenges, and medication  Vaping Use   Vaping Use: Never used  Substance and Sexual Activity   Alcohol use: Yes    Comment: He was drinking 3-4 drinks nightly x 40 years, quit in 2014.     Drug use: No    Comment: history of crack use and drug tested weekly at the [place]   Sexual activity: Yes  Other Topics Concern   Not on file  Social History Narrative    Education: 14 years and 20 years of [employer]. Recently married.    Social Determinants of Health   Financial Resource Strain:  Not on file  Food Insecurity: Not on file  Transportation Needs: Not on file  Physical Activity: Not on file  Stress: Not on file  Social Connections: Not on file     Family History: The patient's family history includes Alcohol abuse in his brother; Cancer - Other in his sister; Diabetes in his mother; Healthy in his daughter and son; Heart attack in his brother and father; Heart disease in his father.  ROS:   All other ROS reviewed and negative. Pertinent positives noted in the HPI.     EKGs/Labs/Other Studies Reviewed:   The following studies were personally reviewed by me today:  LHC 10/30/2020 There is mild to moderate left ventricular systolic dysfunction. The left ventricular ejection fraction is 45-50% by visual estimate.  LV end diastolic pressure is moderately elevated.   1.  Normal coronary arteries. 2.  Mildly to moderately reduced LV systolic function with an EF of 40% with global hypokinesis. 3.  Right heart catheterization showed moderately elevated filling pressures, moderate pulmonary hypertension and mildly reduced cardiac output.  Interpretation of right heart numbers was somewhat difficult due to severe respiratory variations and patient's difficulty with breath-hold.  TTE 10/29/2020  1. Left ventricular ejection fraction, by estimation, is 35%. The left  ventricle has moderately decreased function. The left ventricle  demonstrates global hypokinesis. Left ventricular diastolic parameters are  consistent with Grade I diastolic  dysfunction (impaired relaxation).   2. Right ventricular systolic function is mildly reduced. The right  ventricular size is normal. Tricuspid regurgitation signal is inadequate  for assessing PA pressure.   3. The mitral valve is normal in structure. No evidence of mitral valve  regurgitation. No evidence of mitral stenosis.   4. The aortic valve was not well visualized. Aortic valve regurgitation  is not visualized. No aortic stenosis is  present.   5. Aortic dilatation noted. There is mild dilatation of the aortic root,  measuring 37 mm.   6. The inferior vena cava is normal in size with greater than 50%  respiratory variability, suggesting right atrial pressure of 3 mmHg.   7. Technically difficult study with poor acoustic windows.   Recent Labs: 10/29/2020: ALT 16 11/01/2020: TSH 0.396 11/20/2020: B Natriuretic Peptide 108.0; BUN 20; Creatinine, Ser 1.18; Hemoglobin 11.6; Platelets 217; Potassium 3.7; Sodium 143   Recent Lipid Panel    Component Value Date/Time   CHOL 244 (H) 10/29/2020 1250   TRIG 92 10/29/2020 1250   HDL 79 10/29/2020 1250   CHOLHDL 3.1 10/29/2020 1250   VLDL 18 10/29/2020 1250   LDLCALC 147 (H) 10/29/2020 1250    Physical Exam:   VS:  BP 134/72   Pulse 70   Ht 5' 5.5" (1.664 m)   Wt 175 lb (79.4 kg)   SpO2 99%   BMI 28.68 kg/m    Wt Readings from Last 3 Encounters:  12/29/20 175 lb (79.4 kg)  12/11/20 175 lb 9.6 oz (79.7 kg)  11/20/20 179 lb 3.2 oz (81.3 kg)    General: Well nourished, well developed, in no acute distress Head: Atraumatic, normal size  Eyes: PEERLA, EOMI  Neck: Supple, no JVD Endocrine: No thryomegaly Cardiac: Normal S1, S2; RRR; no murmurs, rubs, or gallops Lungs: Clear to auscultation bilaterally, no wheezing, rhonchi or rales  Abd: Soft, nontender, no hepatomegaly  Ext: Trace edema Musculoskeletal: No deformities, BUE and BLE strength normal and equal Skin: Warm and dry, no rashes   Neuro: Alert and oriented to person, place, time, and situation, CNII-XII grossly intact, no focal deficits  Psych: Normal mood and affect   ASSESSMENT:   Gerald Williams is a 70 y.o. male who presents for the following: 1. Chronic systolic heart failure (HCC)   2. Pulmonary hypertension, unspecified (HCC)   3. Primary hypertension     PLAN:   1. Chronic systolic heart failure (HCC) 2. Pulmonary hypertension, unspecified (HCC) 3. 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  Most recent left heart catheterization was normal.  He has group 2 pulmonary hypertension related to left-sided heart failure. -Cannot afford Entresto or [place].  Reports he would just like to try other medications and alternatives.  We will add losartan 50 mg daily. -Continue metoprolol succinate 50 mg daily and Aldactone 25 mg daily. -We will  stop his HCTZ and start him on Lasix 20 mg daily.  He will take 10 mEq potassium with this.  He reports intermittent swelling in his left leg but he has very trace edema today on exam.  To me he is not volume overloaded but we will start him on daily supplementation as well as daily Lasix therapy. -He will see me back in 3 months we will discuss further.  We will continue with at least 6 months of guideline directed medical therapy before we consider rechecking.  Disposition: Return in about 3 months (around 03/30/2021).  Medication Adjustments/Labs and Tests Ordered: Current medicines are reviewed at length with the patient today.  Concerns regarding medicines are outlined above.  No orders of the defined types were placed in this encounter.  Meds ordered this encounter  Medications   furosemide (LASIX) 20 MG tablet    Sig: Take 1 tablet (20 mg total) by mouth daily.    Dispense:  90 tablet    Refill:  1   potassium chloride SA (KLOR-CON) 10 MEQ tablet    Sig: Take 1 tablet (10 mEq total) by mouth daily.    Dispense:  90 tablet    Refill:  3     Patient Instructions  Medication Instructions:  Stop Marcelline Deist Stop Entresto  Stop HCTZ   Start Losartan 20 mg daily  Start Potassium 10 meq daily with Lasix   *If you need a refill on your cardiac medications before your next appointment, please call your pharmacy*   Follow-Up: At [facility], you and your health needs are our priority.  As part of our continuing mission to provide you with exceptional heart care, we have created designated Provider Care Teams.   These Care Teams include your primary Cardiologist (physician) and Advanced Practice Providers (APPs -  Physician Assistants and Nurse Practitioners) who all work together to provide you with the care you need, when you need it.  We recommend signing up for the patient portal called "MyChart".  Sign up information is provided on this After Visit Summary.  MyChart is used to connect with patients for Virtual Visits (Telemedicine).  Patients are able to view lab/test results, encounter notes, upcoming appointments, etc.  Non-urgent messages can be sent to your provider as well.   To learn more about what you can do with MyChart, go to [email or web address].    Your next appointment:   3 month(s)  The format for your next appointment:   In Person  Provider:   You may see Reatha Harps, MD or one of the following Advanced Practice Providers on your designated Care Team:   Azalee Course, PA-C Marjie Skiff, [place]     Time Spent with Patient: I have spent a total of 35 minutes with patient reviewing hospital notes, telemetry, EKGs, labs and examining the patient as well as establishing an assessment and plan that was discussed with the patient.  > 50% of time was spent in direct patient care.  Signed, Lenna Gilford. Flora Lipps, MD, [facility]  [facility]  [address], [address] [place], [place] [address] [phone]  12/29/2020 11:48 AM

## 2020-12-29 ENCOUNTER — Other Ambulatory Visit: Payer: Self-pay

## 2020-12-29 ENCOUNTER — Ambulatory Visit (INDEPENDENT_AMBULATORY_CARE_PROVIDER_SITE_OTHER): Payer: Medicare Other | Admitting: Cardiovascular Disease

## 2020-12-29 ENCOUNTER — Encounter: Payer: Self-pay | Admitting: Cardiovascular Disease

## 2020-12-29 VITALS — BP 134/72 | HR 70 | Ht 65.5 in | Wt 175.0 lb

## 2020-12-29 DIAGNOSIS — I10 Essential (primary) hypertension: Secondary | ICD-10-CM | POA: Diagnosis not present

## 2020-12-29 DIAGNOSIS — I5022 Chronic systolic (congestive) heart failure: Secondary | ICD-10-CM | POA: Diagnosis not present

## 2020-12-29 DIAGNOSIS — I272 Pulmonary hypertension, unspecified: Secondary | ICD-10-CM | POA: Diagnosis not present

## 2020-12-29 MED ORDER — POTASSIUM CHLORIDE CRYS ER 10 MEQ PO TBCR: 10.0000 meq | EXTENDED_RELEASE_TABLET | Freq: Every day | ORAL | 3 refills | Status: DC

## 2020-12-29 MED ORDER — FUROSEMIDE 20 MG PO TABS: 20.0000 mg | ORAL_TABLET | Freq: Every day | ORAL | 1 refills | Status: DC

## 2020-12-29 NOTE — Patient Instructions (Signed)
Medication Instructions:  Stop Marcelline Deist Stop Sherryll Burger  Stop HCTZ   Start Losartan 20 mg daily  Start Potassium 10 meq daily with Lasix   *If you need a refill on your cardiac medications before your next appointment, please call your pharmacy*   Follow-Up: At [facility], you and your health needs are our priority.  As part of our continuing mission to provide you with exceptional heart care, we have created designated Provider Care Teams.  These Care Teams include your primary Cardiologist (physician) and Advanced Practice Providers (APPs -  Physician Assistants and Nurse Practitioners) who all work together to provide you with the care you need, when you need it.  We recommend signing up for the patient portal called "MyChart".  Sign up information is provided on this After Visit Summary.  MyChart is used to connect with patients for Virtual Visits (Telemedicine).  Patients are able to view lab/test results, encounter notes, upcoming appointments, etc.  Non-urgent messages can be sent to your provider as well.   To learn more about what you can do with MyChart, go to [email or web address].    Your next appointment:   3 month(s)  The format for your next appointment:   In Person  Provider:   You may see Reatha Harps, MD or one of the following Advanced Practice Providers on your designated Care Team:   Azalee Course, PA-C Marjie Skiff, [place]

## 2020-12-30 ENCOUNTER — Telehealth: Payer: Self-pay | Admitting: Cardiovascular Disease

## 2020-12-30 MED ORDER — LOSARTAN POTASSIUM 50 MG PO TABS: 50.0000 mg | ORAL_TABLET | Freq: Every day | ORAL | 3 refills | Status: DC

## 2020-12-30 NOTE — Telephone Encounter (Signed)
Entered order for losartan 50 mg PO daily per review of Dr. Richrd Sox note.    Pt advised of dosage.

## 2020-12-30 NOTE — Telephone Encounter (Signed)
New Message:     Patient said Dr Flora Lipps had put him on Losartan yesterday, but it was not called in. He would like for you to call it in for him please.

## 2020-12-31 ENCOUNTER — Ambulatory Visit: Payer: No Typology Code available for payment source | Admitting: Internal Medicine

## 2021-01-01 ENCOUNTER — Other Ambulatory Visit: Payer: Self-pay

## 2021-01-01 ENCOUNTER — Encounter (HOSPITAL_COMMUNITY): Payer: Self-pay

## 2021-01-01 ENCOUNTER — Ambulatory Visit (HOSPITAL_COMMUNITY)
Admission: EM | Admit: 2021-01-01 | Discharge: 2021-01-01 | Disposition: A | Discharge status: Home / Self Care | Payer: Medicare Other | Attending: Family Medicine | Admitting: Family Medicine

## 2021-01-01 ENCOUNTER — Ambulatory Visit (INDEPENDENT_AMBULATORY_CARE_PROVIDER_SITE_OTHER): Payer: Medicare Other

## 2021-01-01 DIAGNOSIS — M79671 Pain in right foot: Secondary | ICD-10-CM | POA: Diagnosis not present

## 2021-01-01 DIAGNOSIS — M79674 Pain in right toe(s): Secondary | ICD-10-CM

## 2021-01-01 DIAGNOSIS — W19XXXA Unspecified fall, initial encounter: Secondary | ICD-10-CM

## 2021-01-01 MED ORDER — DICLOFENAC SODIUM 1 % EX GEL: 2.0000 g | Freq: Four times a day (QID) | CUTANEOUS | 1 refills | Status: AC

## 2021-01-01 NOTE — ED Provider Notes (Signed)
[facility]    CSN: [id number] Arrival date & time: 01/01/21  [id number]      History   Chief Complaint Chief Complaint  Patient presents with   Toe Pain    HPI Gerald Williams is a 70 y.o. male.   Patient presenting today with several day history of right great toe pain extending slightly into the foot on the side after tripping and falling, stubbing the toe.  Has been having swelling, stiffness since incident.  Taking Tylenol around-the-clock with mild temporary relief.  Denies numbness, tingling, inability to bear weight on the foot, history of orthopedic injuries to this area.  Past Medical History:  Diagnosis Date   CHF (congestive heart failure) (HCC)    Emphysema    Erectile dysfunction    Hyperlipemia    Hypertension    Polysubstance abuse (HCC)    stopped using crack/cocaine December 4th 20-14   PTSD (post-traumatic stress disorder)    Sleep apnea    Patient Active Problem List   Diagnosis Date Noted   Pleuritic chest pain 11/20/2020   Angina at rest [facility]) 10/30/2020   Acute systolic heart failure (HCC)    Chronic respiratory failure with hypoxia (HCC) 09/18/2018   Exercise hypoxemia 11/07/2017   Frequent headaches 05/23/2017   Acute pain of right knee 12/23/2016   Old tear of lateral meniscus of right knee 12/23/2016   Blood in stool 10/05/2015   HTN (hypertension) 06/18/2015   Cephalalgia 06/18/2015   Dyslipidemia 06/12/2015   COPD GOLD III 05/28/2015   Peripheral neuropathy 05/20/2015   COPD (chronic obstructive pulmonary disease) with emphysema (HCC) 05/20/2015   PTSD (post-traumatic stress disorder) 05/20/2015   Right carotid bruit 05/20/2015   Frequent urination 05/20/2015   Hematuria 07/15/2014   History of tobacco abuse:  quit in ~ 06/2013  07/14/2014   OSA on CPAP 07/14/2014   Pre-diabetes 07/14/2014   Claudication, R>L 07/14/2014   Cocaine use, history of 03/26/2011   Chest pain 03/26/2011    Past Surgical History:  Procedure  Laterality Date   CARDIAC CATHETERIZATION     CYSTOSCOPY WITH INSERTION OF UROLIFT N/A 08/03/2020   Procedure: CYSTOSCOPY WITH INSERTION OF UROLIFT;  Surgeon: Marcine Matar, MD;  Location: [facility];  Service: Urology;  Laterality: N/A;  30 MINS   LEFT HEART CATHETERIZATION WITH CORONARY ANGIOGRAM N/A 07/15/2014   Procedure: LEFT HEART CATHETERIZATION WITH CORONARY ANGIOGRAM;  Surgeon: Lennette Bihari, MD;  Location: [facility] CATH LAB;  Service: Cardiovascular;  Laterality: N/A;   RIGHT/LEFT HEART CATH AND CORONARY ANGIOGRAPHY N/A 10/30/2020   Procedure: RIGHT/LEFT HEART CATH AND CORONARY ANGIOGRAPHY;  Surgeon: Iran Ouch, MD;  Location: [facility];  Service: Cardiovascular;  Laterality: N/A;       Home Medications    Prior to Admission medications   Medication Sig Start Date End Date Taking? Authorizing Provider  diclofenac Sodium (VOLTAREN) 1 % GEL Apply 2 g topically 4 (four) times daily. 01/01/21  Yes Particia Nearing, PA-C  acetaminophen (TYLENOL) 500 MG tablet Take 1,000 mg by mouth every 6 (six) hours as needed for mild pain, fever or headache.    [provider]  albuterol (PROVENTIL) (2.5 MG/3ML) 0.083% nebulizer solution Take 2.5 mg by nebulization every 6 (six) hours as needed for wheezing or shortness of breath.    [provider]  albuterol (VENTOLIN HFA) 108 (90 Base) MCG/ACT inhaler Inhale 2 puffs into the lungs every 6 (six) hours as needed for shortness of breath or wheezing.  09/24/20   Alfonse Spruce, MD  atorvastatin (LIPITOR) 80 MG tablet Take 80 mg by mouth daily. 05/12/20   [provider]  budesonide-formoterol (SYMBICORT) 160-4.5 MCG/ACT inhaler Take 2 puffs first thing in am and then another 2 puffs about 12 hours later. 11/20/20   [name], MD  dapagliflozin propanediol (FARXIGA) 10 MG TABS tablet Take 1 tablet (10 mg total) by mouth daily before breakfast. Patient not taking: Reported on 12/29/2020 12/11/20   Sande Rives, MD  doxycycline (VIBRAMYCIN) 100 MG capsule Take 1 capsule (100 mg total) by mouth 2 (two) times daily. 11/20/20   Terrilee Files, MD  DULoxetine (CYMBALTA) 60 MG capsule TAKE ONE CAPSULE BY MOUTH TWO TIMES A DAY FOR POSTTRAUMATIC STRESS DISORDER AND DEPRESSION 09/15/20   [provider]  furosemide (LASIX) 20 MG tablet Take 1 tablet (20 mg total) by mouth daily. 12/29/20   O'NealRonnald Ramp, MD  glucose blood (COOL BLOOD GLUCOSE TEST STRIPS) test strip Used to test blood sugar x2 daily---diagnosis code r73.03--for one touch verio flex 12/08/15   Burns, [name], MD  guaiFENesin (MUCINEX) 600 MG 12 hr tablet Take 600 mg by mouth 2 (two) times daily as needed. Patient not taking: Reported on 12/29/2020 02/07/20   [provider]  ipratropium (ATROVENT) 0.03 % nasal spray Place 1 spray into both nostrils every 8 (eight) hours as needed for rhinitis. 10/08/20   Alfonse Spruce, MD  Ipratropium-Albuterol (COMBIVENT) 20-100 MCG/ACT AERS respimat Inhale 1 puff into the lungs 2 (two) times daily. 07/16/20   [provider]  Lancets MISC Used to test blood sugar x2 daily---diagnosis code r73.03--for one touch verio flex 12/08/15   Burns, [name], MD  losartan (COZAAR) 50 MG tablet Take 1 tablet (50 mg total) by mouth daily. 12/30/20 03/30/21  Sande Rives, MD  metoprolol succinate (TOPROL-XL) 50 MG 24 hr tablet Take 1 tablet (50 mg total) by mouth daily. Take with or immediately following a meal. 12/11/20   [name], [name], MD  mirtazapine (REMERON) 30 MG tablet Take 30 mg by mouth at bedtime.    [provider]  OXYGEN Inhale 4-5 L into the lungs continuous.    [provider]  potassium chloride SA (KLOR-CON) 10 MEQ tablet Take 1 tablet (10 mEq total) by mouth daily. 12/29/20 03/29/21  Sande Rives, MD  spironolactone (ALDACTONE) 25 MG tablet Take 1 tablet (25 mg total) by mouth daily. 12/11/20   [name], [name], MD     Family History Family History  Problem Relation Age of Onset   Diabetes Mother        Deceased   Heart disease Father        Deceased   Heart attack Father        x5   Cancer - Other Sister    Healthy Son    Healthy Daughter    Heart attack Brother        Deceased   Alcohol abuse Brother        Deceased    Social History Social History   Tobacco Use   Smoking status: Former    Packs/day: 1.50    Years: 50.00    Pack years: 75.00    Types: Cigarettes    Start date: 04/26/1963    Quit date: 09/22/2013    Years since quitting: 7.2   Smokeless tobacco: Never   Tobacco comments:    patient using the patch, losenges, and medication  Vaping Use   Vaping Use: Never used  Substance Use Topics   Alcohol use: Not Currently    Comment: He was drinking 3-4 drinks nightly x 40 years, quit in 2014.     Drug use: No    Comment: history of crack use and drug tested weekly at [facility]     Allergies   Budeson-glycopyrrol-formoterol, Gabapentin, Nortriptyline, Oxcarbazepine, Tiotropium, Tiotropium bromide-olodaterol, and Olodaterol   Review of Systems Review of Systems Per HPI  Physical Exam Triage Vital Signs ED Triage Vitals  Enc Vitals Group     BP 01/01/21 0821 129/85     Pulse Rate 01/01/21 0821 69     Resp 01/01/21 0821 18     Temp 01/01/21 0821 98.2 F (36.8 C)     Temp Source 01/01/21 0821 Oral     SpO2 01/01/21 0821 100 %     Weight --      Height --      Head Circumference --      Peak Flow --      Pain Score 01/01/21 0822 7     Pain Loc --      Pain Edu? --      Excl. in GC? --    No data found.  Updated Vital Signs BP 129/85 (BP Location: Left Arm)   Pulse 69   Temp 98.2 F (36.8 C) (Oral)   Resp 18   SpO2 100%   Visual Acuity Right Eye Distance:   Left Eye Distance:   Bilateral Distance:    Right Eye Near:   Left Eye Near:    Bilateral Near:     Physical Exam Vitals and nursing note reviewed.  Constitutional:      Appearance:  Normal appearance.  HENT:     Head: Atraumatic.     Mouth/Throat:     Mouth: Mucous membranes are moist.     Pharynx: Oropharynx is clear.  Eyes:     Extraocular Movements: Extraocular movements intact.     Conjunctiva/sclera: Conjunctivae normal.  Cardiovascular:     Rate and Rhythm: Normal rate and regular rhythm.  Pulmonary:     Effort: Pulmonary effort is normal.     Breath sounds: Normal breath sounds.  Musculoskeletal:        General: Swelling, tenderness and signs of injury present. No deformity. Normal range of motion.     Cervical back: Normal range of motion and neck supple.     Comments: Mild diffuse edema to the right great toe and into MTP.  Pain extending about 1 inch into the first metatarsal.  Fairly good range of motion intact, antalgic gait  Skin:    General: Skin is warm and dry.  Neurological:     General: No focal deficit present.     Mental Status: He is oriented to person, place, and time.     Comments: Right foot neurovascularly intact  Psychiatric:        Mood and Affect: Mood normal.        Thought Content: Thought content normal.        Judgment: Judgment normal.   UC Treatments / Results  Labs (all labs ordered are listed, but only abnormal results are displayed) Labs Reviewed - No data to display  EKG   Radiology DG Foot Complete Right  Result Date: 01/01/2021 CLINICAL DATA:  70 year old male with continued pain after fall 2 days ago. EXAM: RIGHT FOOT COMPLETE - 3+ VIEW COMPARISON:  None. FINDINGS: Bone  mineralization is within normal limits. There is no evidence of fracture or dislocation. There is no evidence of arthropathy or other focal bone abnormality. Generalized soft tissue swelling in the distal foot. No soft tissue gas. IMPRESSION: Soft tissue swelling with no acute fracture or dislocation identified about the right foot. Electronically Signed   By: Odessa Fleming M.D.   On: 01/01/2021 08:58    Procedures Procedures (including critical care  time)  Medications Ordered in UC Medications - No data to display  Initial Impression / Assessment and Plan / UC Course  I have reviewed the triage vital signs and the nursing notes.  Pertinent labs & imaging results that were available during my care of the patient were reviewed by me and considered in my medical decision making (see chart for details).     Right foot x-ray negative for acute bony injury/abnormality.  Given his pain on ambulation, will provide a postop shoe for comfort as he states he is not able to stay fully off the foot while he heals.  Discussed RICE protocol, over-the-counter pain relievers, diclofenac gel.  Follow-up with PCP for recheck if not fully resolving.  Final Clinical Impressions(s) / UC Diagnoses   Final diagnoses:  Great toe pain, right  Fall, initial encounter   Discharge Instructions   None    ED Prescriptions     Medication Sig Dispense Auth. Provider   diclofenac Sodium (VOLTAREN) 1 % GEL Apply 2 g topically 4 (four) times daily. 150 g Particia Nearing, [place]      PDMP not reviewed this encounter.   Roosvelt Maser [place], [place] 01/01/21 [phone]

## 2021-01-01 NOTE — ED Triage Notes (Signed)
Pt states he tripped and fell last Thursday and hit his rt great toe. C/o pain and swelling to toe and foot. States took tylenol 1000mg  at 4am.

## 2021-01-06 ENCOUNTER — Other Ambulatory Visit: Payer: Self-pay

## 2021-01-06 ENCOUNTER — Ambulatory Visit (INDEPENDENT_AMBULATORY_CARE_PROVIDER_SITE_OTHER): Payer: Medicare Other | Admitting: Internal Medicine

## 2021-01-06 ENCOUNTER — Encounter: Payer: Self-pay | Admitting: Internal Medicine

## 2021-01-06 DIAGNOSIS — J449 Chronic obstructive pulmonary disease, unspecified: Secondary | ICD-10-CM | POA: Diagnosis not present

## 2021-01-06 DIAGNOSIS — J9611 Chronic respiratory failure with hypoxia: Secondary | ICD-10-CM

## 2021-01-06 MED ORDER — IPRATROPIUM BROMIDE 0.03 % NA SOLN: 1.0000 | Freq: Three times a day (TID) | NASAL | 5 refills | Status: AC | PRN

## 2021-01-06 MED ORDER — STIOLTO RESPIMAT 2.5-2.5 MCG/ACT IN AERS: 2.0000 | INHALATION_SPRAY | Freq: Every day | RESPIRATORY_TRACT | 0 refills | Status: DC

## 2021-01-06 MED ORDER — STIOLTO RESPIMAT 2.5-2.5 MCG/ACT IN AERS: 2.0000 | INHALATION_SPRAY | Freq: Every day | RESPIRATORY_TRACT | 11 refills | Status: DC

## 2021-01-06 NOTE — Patient Instructions (Signed)
Plan A = Automatic = Always=    Stiolto 2 puffs first thing in am   Plan B = Backup (to supplement plan A, not to replace it) Only use your albuterol inhaler as a rescue medication to be used if you can't catch your breath by resting or doing a relaxed purse lip breathing pattern.  - The less you use it, the better it will work when you need it. - Ok to use the inhaler up to 2 puffs  every 4 hours if you must but call for appointment if use goes up over your usual need - Don't leave home without it !!  (think of it like the spare tire for your car)   Plan C = Crisis (instead of Plan B but only if Plan B stops working) - only use your albuterol nebulizer if you first try Plan B and it fails to help > ok to use the nebulizer up to every 4 hours but if start needing it regularly call for immediate appointment   Please schedule a follow up visit in 3 months but call sooner if needed

## 2021-01-06 NOTE — Progress Notes (Signed)
Gerald Williams, male    DOB: 1950-09-10    MRN: [id number]   Brief patient profile:  70   yobm quit smoking 08/2013 (pt says 2010)  GOLD III/ group D symptoms  with  better on trelegy vs symbicort with less need for saba but concerned about cost of non-VA meds.      History of Present Illness  11/19/2018  Pulmonary/ 1st office eval/Gerald Williams on ACEi and ARB Chief Complaint  Patient presents with   Follow-up    Breathing is overall doing well. He is using his albuterol inhaler 2-3 x per day and has not used his neb.   Dyspnea:  Can walk a half a block on 3lpm but does not check sats walking Cough: none  Sleep: sleeps on cpap/ 3lpm  SABA use: while on trelegy not needing much albuterol  rec It would be preferable if you would take higher doses of diovan and stop lisinopril and the latter causes confusion regarding interpretation of symptoms that look like copd in many cause  The alternative to trelegy would be restarting symbicort using spiriva in combination free thru the [place]   03/11/2019  f/u ov/Gerald Williams re:  GOLD III spirometry / 02 dep  Very poor hfa maint symb 160 2bid /spiriva dpi per [facility]  Chief Complaint  Patient presents with   Follow-up    Patient comes in today for increased shortness of breath with exertion. Patient is normally on 3L and has been turning it up to 4-5L when moving around because he is so short of breath. Patient wants to switch home health care companies and wants a home concentrator.  Dyspnea:  hc parking into store = [facility] = can't walk 100 yards even at a slow pace at a flat grade s stopping due to sob   Cough: daytime minimal mucus, sporadic  Sleeping: fine 3 pillows and cpoap SABA use:   02: 3lpm 95% when check  - turns it up to 4  Lpmwalking with sats   95%  - sleeping cpap / 3lpm Rec Plan A = Automatic = Always=   Symbicort 160 Take 2 puffs first thing in am and then another 2 puffs about 12 hours later and spiriva each am  Plan B = Backup (to supplement plan  A, not to replace it) Only use your albuterol inhaler or combivent  as a rescue medication  Plan C = Crisis (instead of Plan B but only if Plan B stops working) - only use your albuterol nebulizer if you first try Plan B and it fails to help > ok to use the nebulizer up to every 4 hours but if start needing it regularly call for immediate appointment   Admit date: 10/29/2020 Discharge date: 11/02/2020  Discharge Diagnoses:  Active Problems:   Chest pain   Angina at rest [facility])   Acute systolic heart failure (HCC)   COPD    Chronic respiratory failure on 4-5 L home O2     History of present illness:  69/M with history of advanced COPD on 5 L home O2, hypertension, dyslipidemia presented to the ED with 2-day history of left-sided chest pain.   Hospital Course:    Atypical chest pain -Ruled out for ACS, chest pain has resolved -EKG nonischemic, high-sensitivity troponins are negative -Left heart cath with normal coronaries   Nonischemic cardiomyopathy Acute systolic CHF -ECHO noted EF of 35%, cath noted normal coronaries, filling pressures were high on cath, that evening 7/8 -received IV Lasix,  then became hypotensive -Cardiology following, gradually uptitrating meds due to hypotension on 7/8 -toprol resumed and started on low dose losartan -Clinically felt to be euvolemic and hence not felt to require standing dose of diuretics at the time of discharge -added low dose lasix 20mg  PRN -Follow-up with [facility]   Hypotension -Overnight following cath on 7/8 evening -I suspect this is secondary to medications, (got Lasix, prazosin, lisinopril and beta-blocker) and sedation.  BP improved now -Clinically do not suspect acute PE -Monitor clinically, antihypertensives held -Restarted Toprol and added low-dose losartan -Blood pressure stable now   AKI -due to hypotension, held lisinopril -Improving, creatinine 1.4   COPD/chronic respiratory failure On 5 L home O2 at  baseline -Quit smoking 10 years ago, stable, continue Combivent and Symb    11/20/2020  post hosp f/u ov/Gerald Williams re: GOLD  3  maint on combivent thru 11/22/2020 and 02 4-8 lpm  Chief Complaint  Patient presents with   Acute Visit    Patient reports that he had procedure for his heart and feels that he is having trouble catching his breath x 2 weeks.   Dyspnea:  2-3 aisles at [facility] and has to stop on 5 lpm with sats down to 70s now reported  Cough: none  Sleeping: bed is flat, sleeps on back with 2 pillows and cpap per [facility]  SABA use: combivent bid  02: 4-5 lpm  Covid status:   x 3 vax  Went back to [facility] for what he describes as new onset  L ant peuritic, localized cp that started 2 d p d/c when he had [facility]  7/8 with wedge of 28 >  advil may help some and assoc worse sob since left hosp> returned to  ER dx as "copd exacerbation with chest tightness"  says no better since rx and insists pain in brand new and well localized and only present when tries to breath deeply Rec Go to [facility] ER further eval of L sided chest pain when you take a deep breat> CTa No definite evidence of pulmonary embolus. Focal ill-defined opacity measuring 12 x 7 mm is noted in the periphery of the left upper lobe. While this may represent focal inflammation or atelectasis, malignancy cannot be excluded.      01/06/2021  f/u ov/Gerald Williams re: GOLD 3 copd    maint on symbicort 160 2bid / combivent   Chief Complaint  Patient presents with   Follow-up    Patient needs a refill of his combivent and has runny nose.    Dyspnea:  sev aisles at [facility]  5lpm  Cough: none Sleeping: capap/ 6lpm concentrator 2pillows flat bed SABA use: combivent 4 x daily  02: 5lplm - 6lpm  Covid status:   vax x 3  Cp correlated with area of pain L ant chest and rapidly resolve on abx    No obvious day to day or daytime variability or assoc excess/ purulent sputum or mucus plugs or hemoptysis or cp or chest tightness, subjective wheeze or overt sinus or hb  symptoms.   Sleeping  without nocturnal  or early am exacerbation  of respiratory  c/o's or need for noct saba. Also denies any obvious fluctuation of symptoms with weather or environmental changes or other aggravating or alleviating factors except as outlined above   No unusual exposure hx or h/o childhood pna/ asthma or knowledge of premature birth.  Current Allergies, Complete Past Medical History, Past Surgical History, Family History, and Social History were  reviewed in [facility] electronic medical record.  ROS  The following are not active complaints unless bolded Hoarseness, sore throat, dysphagia, dental problems, itching, sneezing,  nasal congestion or discharge of excess mucus or purulent secretions, ear ache,   fever, chills, sweats, unintended wt loss or wt gain, classically pleuritic or exertional cp,  orthopnea pnd or arm/hand swelling  or leg swelling, presyncope, palpitations, abdominal pain, anorexia, nausea, vomiting, diarrhea  or change in bowel habits or change in bladder habits, change in stools or change in urine, dysuria, hematuria,  rash, arthralgias, visual complaints, headache, numbness, weakness or ataxia or problems with walking or coordination,  change in mood or  memory.        Current Meds  Medication Sig   acetaminophen (TYLENOL) 500 MG tablet Take 1,000 mg by mouth every 6 (six) hours as needed for mild pain, fever or headache.   albuterol (PROVENTIL) (2.5 MG/3ML) 0.083% nebulizer solution Take 2.5 mg by nebulization every 6 (six) hours as needed for wheezing or shortness of breath.   albuterol (VENTOLIN HFA) 108 (90 Base) MCG/ACT inhaler Inhale 2 puffs into the lungs every 6 (six) hours as needed for shortness of breath or wheezing.   atorvastatin (LIPITOR) 80 MG tablet Take 80 mg by mouth daily.   budesonide-formoterol (SYMBICORT) 160-4.5 MCG/ACT inhaler Take 2 puffs first thing in am and then another 2 puffs about 12 hours later.   diclofenac Sodium  (VOLTAREN) 1 % GEL Apply 2 g topically 4 (four) times daily.   DULoxetine (CYMBALTA) 60 MG capsule TAKE ONE CAPSULE BY MOUTH TWO TIMES A DAY FOR POSTTRAUMATIC STRESS DISORDER AND DEPRESSION   fluticasone (FLONASE) 50 MCG/ACT nasal spray INSTILL 2 SPRAYS INTO EACH NOSTRIL EVERY DAY MAXIMUM 2 SPRAYS IN EACH NOSTRIL DAILY. FOR ALLERGIES   furosemide (LASIX) 20 MG tablet Take 1 tablet (20 mg total) by mouth daily.   glucose blood (COOL BLOOD GLUCOSE TEST STRIPS) test strip Used to test blood sugar x2 daily---diagnosis code r73.03--for one touch verio flex   guaiFENesin (MUCINEX) 600 MG 12 hr tablet Take 600 mg by mouth 2 (two) times daily as needed.   ipratropium (ATROVENT) 0.03 % nasal spray Place 1 spray into both nostrils every 8 (eight) hours as needed for rhinitis.   Ipratropium-Albuterol (COMBIVENT) 20-100 MCG/ACT AERS respimat Inhale 1 puff into the lungs 2 (two) times daily.   Lancets MISC Used to test blood sugar x2 daily---diagnosis code r73.03--for one touch verio flex   metoprolol succinate (TOPROL-XL) 50 MG 24 hr tablet Take 1 tablet (50 mg total) by mouth daily. Take with or immediately following a meal.   mirtazapine (REMERON) 30 MG tablet Take 30 mg by mouth at bedtime.   OXYGEN Inhale 4-5 L into the lungs continuous.   potassium chloride SA (KLOR-CON) 10 MEQ tablet Take 1 tablet (10 mEq total) by mouth daily.   spironolactone (ALDACTONE) 25 MG tablet Take 1 tablet (25 mg total) by mouth daily.   [DISCONTINUED] losartan (COZAAR) 50 MG tablet Take 1 tablet (50 mg total) by mouth daily.                 Objective:      01/06/2021       178  11/20/2020       179   03/11/19 176 lb 9.6 oz (80.1 kg)  11/19/18 166 lb 9.6 oz (75.6 kg)  10/24/18 160 lb (72.6 kg)     Vital signs reviewed  01/06/2021  - Note at rest  02 sats  100% on 2lpm    General appearance:    amb mb nad     HEENT : pt wearing mask not removed for exam due to covid - 19 concerns.    NECK :  without  JVD/Nodes/TM/ nl carotid upstrokes bilaterally   LUNGS: no acc muscle use,  Mild barrel  contour chest wall with bilateral  Distant bs s audible wheeze and  without cough on insp or exp maneuvers  and mild  Hyperresonant  to  percussion bilaterally     CV:  RRR  no s3 or murmur or increase in P2, and no edema   ABD:  soft and nontender with pos end  insp Hoover's  in the supine position. No bruits or organomegaly appreciated, bowel sounds nl  MS:   Nl gait/  ext warm without deformities, calf tenderness, cyanosis or clubbing No obvious joint restrictions   SKIN: warm and dry without lesions    NEURO:  alert, approp, nl sensorium with  no motor or cerebellar deficits apparent.                Assessment

## 2021-01-06 NOTE — Assessment & Plan Note (Signed)
Quit smoking 2015  - Spirometry 11/19/2018  FEV1 1.00 (36%)  Ratio 0.49  With no response  To saba on ? Prior to test - 03/11/2019  After extensive coaching inhaler device,  effectiveness  < 25% (way too fast, way short Ti, double trigger)  -  50% after coaching  - 01/06/2021  After extensive coaching inhaler device,  effectiveness =    90% with SMI > try stiolto     Group D in terms of symptom/risk and laba/lama/ICS  therefore appropriate rx at this point >>>  symbicort /spiriva and prn saba  Re saba: I spent extra time with pt today reviewing appropriate use of albuterol for prn use on exertion with the following points: 1) saba is for relief of sob that does not improve by walking a slower pace or resting but rather if the pt does not improve after trying this first. 2) If the pt is convinced, as many are, that saba helps recover from activity faster then it's easy to tell if this is the case by re-challenging : ie stop, take the inhaler, then p 5 minutes try the exact same activity (intensity of workload) that just caused the symptoms and see if they are substantially diminished or not after saba 3) if there is an activity that reproducibly causes the symptoms, try the saba 15 min before the activity on alternate days   If in fact the saba really does help, then fine to continue to use it prn but advised may need to look closer at the maintenance regimen being used to achieve better control of airways disease with exertion.

## 2021-01-06 NOTE — Assessment & Plan Note (Signed)
As of  11/19/2018  = 3lpm 24/7   - 03/11/2019 Patient Saturations on Room Air at Rest = 100%  Room Air while Ambulating = 88% and on 3 Liters of pulsed oxygen while Ambulating = 92% so approved for POC   Advised: Ok to try albuterol 15 min before an activity (on alternating days)  that you know would usually make you short of breath and see if it makes any difference and if makes none then don't take albuterol after activity unless you can't catch your breath as this means it's the resting that helps, not the albuterol.              Each maintenance medication was reviewed in detail including emphasizing most importantly the difference between maintenance and prns and under what circumstances the prns are to be triggered using an action plan format where appropriate.  Total time for H and P, chart review, counseling, reviewing hfa/smi 02  device(s) and generating customized AVS unique to this office visit / same day charting = 26 min

## 2021-01-08 ENCOUNTER — Telehealth: Payer: Self-pay | Admitting: Internal Medicine

## 2021-01-08 NOTE — Telephone Encounter (Signed)
ATC, left VM. 

## 2021-01-08 NOTE — Telephone Encounter (Signed)
That  did not happen when he used it in the office so should try just one puff daily and rinse and gargle and if not better then ok to change to combivent 1 puff qid (keep in mind it's the same medicine as in stiolto, just weaker and doesn't last as long

## 2021-01-08 NOTE — Telephone Encounter (Signed)
Called and spoke with pt who states the Stiolto has been making him cough. Pt said that he feels like it is not working for him due to this and would like to be switched back to Combivent. Dr. Sherene Sires, please advise.    Patient Instructions by [name], MD at 01/06/2021 3:30 PM  Author: [name], MD Author Type: Physician Filed: 01/06/2021  3:37 PM  Note Status: Signed Cosign: Cosign Not Required Encounter Date: 01/06/2021  Editor: [name], MD (Physician)               Plan A = Automatic = Always=    Stiolto 2 puffs first thing in am    Plan B = Backup (to supplement plan A, not to replace it) Only use your albuterol inhaler as a rescue medication to be used if you can't catch your breath by resting or doing a relaxed purse lip breathing pattern.  - The less you use it, the better it will work when you need it. - Ok to use the inhaler up to 2 puffs  every 4 hours if you must but call for appointment if use goes up over your usual need - Don't leave home without it !!  (think of it like the spare tire for your car)    Plan C = Crisis (instead of Plan B but only if Plan B stops working) - only use your albuterol nebulizer if you first try Plan B and it fails to help > ok to use the nebulizer up to every 4 hours but if start needing it regularly call for immediate appointment     Please schedule a follow up visit in 3 months but call sooner if needed

## 2021-01-08 NOTE — Telephone Encounter (Signed)
Called and spoke with patient to let him know the recs from Dr. Sherene Sires. Advised him to try just one puff daily and rinse and gargle and if not better then to call the office back and let us know. He expressed understanding.  If patient calls back and is still coughing and not doing any better then ok to change to combivent 1 puff qid (keep in mind it's the same medicine as in stiolto, just weaker and doesn't last as long   Nothing further needed at this time.

## 2021-01-20 ENCOUNTER — Telehealth (HOSPITAL_COMMUNITY): Payer: Self-pay

## 2021-01-20 NOTE — Telephone Encounter (Signed)
[facility] called in regards to see if pt has been scheduled for [facility] rehab, I advised the [facility] that we have received his authorization for pt to participate in pulmonary rehab but we have a 1-4 month backlog with pulmonary rehab.

## 2021-01-29 NOTE — Telephone Encounter (Signed)
Called and spoke with pt in regards to PR, pt stated he would like to attend the PR program at [facility]. Adv pt I will fax his [facility] referral to [facility].   Closed referral

## 2021-03-04 ENCOUNTER — Encounter (HOSPITAL_COMMUNITY)
Admission: RE | Admit: 2021-03-04 | Discharge: 2021-03-04 | Disposition: A | Discharge status: Home / Self Care | Payer: No Typology Code available for payment source | Source: Ambulatory Visit | Attending: Internal Medicine | Admitting: Internal Medicine

## 2021-03-04 ENCOUNTER — Other Ambulatory Visit: Payer: Self-pay

## 2021-03-04 ENCOUNTER — Encounter (HOSPITAL_COMMUNITY): Payer: Self-pay

## 2021-03-04 VITALS — BP 110/76 | HR 78 | Ht 65.5 in | Wt 173.5 lb

## 2021-03-04 DIAGNOSIS — J449 Chronic obstructive pulmonary disease, unspecified: Secondary | ICD-10-CM | POA: Insufficient documentation

## 2021-03-04 NOTE — Progress Notes (Signed)
Pulmonary Individual Treatment Plan  Patient Details  Name: Gerald Williams MRN: [id number] Date of Birth: [birth year] Referring Provider:   Flowsheet Row PULMONARY REHAB COPD ORIENTATION from 03/04/2021 in [facility] CARDIAC REHABILITATION  Referring Provider Dr. Maryellen Pile       Initial Encounter Date:  Flowsheet Row PULMONARY REHAB COPD ORIENTATION from 03/04/2021 in [facility] CARDIAC REHABILITATION  Date 03/04/21       Visit Diagnosis: Chronic obstructive pulmonary disease, unspecified COPD type (HCC)  Patient's Home Medications on Admission:   Current Outpatient Medications:    acetaminophen (TYLENOL) 500 MG tablet, Take 1,000 mg by mouth every 6 (six) hours as needed for mild pain, fever or headache., Disp: , Rfl:    albuterol (PROVENTIL) (2.5 MG/3ML) 0.083% nebulizer solution, Take 2.5 mg by nebulization every 6 (six) hours as needed for wheezing or shortness of breath., Disp: , Rfl:    albuterol (VENTOLIN HFA) 108 (90 Base) MCG/ACT inhaler, Inhale 2 puffs into the lungs every 6 (six) hours as needed for shortness of breath or wheezing., Disp: 18 g, Rfl: 1   atorvastatin (LIPITOR) 80 MG tablet, Take 80 mg by mouth daily., Disp: , Rfl:    diclofenac Sodium (VOLTAREN) 1 % GEL, Apply 2 g topically 4 (four) times daily., Disp: 150 g, Rfl: 1   DULoxetine (CYMBALTA) 60 MG capsule, TAKE ONE CAPSULE BY MOUTH TWO TIMES A DAY FOR POSTTRAUMATIC STRESS DISORDER AND DEPRESSION, Disp: , Rfl:    fluticasone (FLONASE) 50 MCG/ACT nasal spray, INSTILL 2 SPRAYS INTO EACH NOSTRIL EVERY DAY MAXIMUM 2 SPRAYS IN EACH NOSTRIL DAILY. FOR ALLERGIES, Disp: , Rfl:    furosemide (LASIX) 20 MG tablet, Take 1 tablet (20 mg total) by mouth daily., Disp: 90 tablet, Rfl: 1   glucose blood (COOL BLOOD GLUCOSE TEST STRIPS) test strip, Used to test blood sugar x2 daily---diagnosis code r73.03--for one touch verio flex, Disp: 200 each, Rfl: 3   guaiFENesin (MUCINEX) 600 MG 12 hr tablet, Take 600 mg by mouth 2 (two)  times daily as needed., Disp: , Rfl:    ipratropium (ATROVENT) 0.03 % nasal spray, Place 1 spray into both nostrils every 8 (eight) hours as needed for rhinitis., Disp: 30 mL, Rfl: 5   Lancets MISC, Used to test blood sugar x2 daily---diagnosis code r73.03--for one touch verio flex, Disp: 200 each, Rfl: 3   losartan (COZAAR) 25 MG tablet, Take 25 mg by mouth daily., Disp: , Rfl:    metoprolol succinate (TOPROL-XL) 50 MG 24 hr tablet, Take 1 tablet (50 mg total) by mouth daily. Take with or immediately following a meal., Disp: 90 tablet, Rfl: 1   mirtazapine (REMERON) 30 MG tablet, Take 30 mg by mouth at bedtime., Disp: , Rfl:    OXYGEN, Inhale 4-5 L into the lungs continuous., Disp: , Rfl:    potassium chloride SA (KLOR-CON) 10 MEQ tablet, Take 1 tablet (10 mEq total) by mouth daily., Disp: 90 tablet, Rfl: 3   spironolactone (ALDACTONE) 25 MG tablet, Take 1 tablet (25 mg total) by mouth daily., Disp: 90 tablet, Rfl: 1   Tiotropium Bromide-Olodaterol (STIOLTO RESPIMAT) 2.5-2.5 MCG/ACT AERS, Inhale 2 puffs into the lungs daily., Disp: 1 each, Rfl: 11   Tiotropium Bromide-Olodaterol (STIOLTO RESPIMAT) 2.5-2.5 MCG/ACT AERS, Inhale 2 puffs into the lungs daily., Disp: 4 g, Rfl: 0  Past Medical History: Past Medical History:  Diagnosis Date   CHF (congestive heart failure) (HCC)    Emphysema    Erectile dysfunction    Hyperlipemia  Hypertension    Polysubstance abuse (HCC)    stopped using crack/cocaine December 4th 20-14   PTSD (post-traumatic stress disorder)    Sleep apnea     Tobacco Use: Social History   Tobacco Use  Smoking Status Former   Packs/day: 1.50   Years: 50.00   Pack years: 75.00   Types: Cigarettes   Start date: 04/26/1963   Quit date: 09/22/2013   Years since quitting: 7.4  Smokeless Tobacco Never  Tobacco Comments   patient using the patch, losenges, and medication    Labs: Recent Review Flowsheet Data     Labs for ITP Cardiac and Pulmonary Rehab Latest Ref  Rng & Units 05/07/2019 10/29/2020 10/30/2020 10/30/2020 10/31/2020   Cholestrol 0 - 200 mg/dL 295(A) 213(Y) - - -   LDLCALC 0 - 99 mg/dL 865(H) 846(N) - - -   HDL >40 mg/dL 59 79 - - -   Trlycerides <150 mg/dL 84 92 - - -   Hemoglobin A1c 4.8 - 5.6 % 6.1(H) - - - -   PHART 7.350 - 7.450 - - - - 7.371   PCO2ART 32.0 - 48.0 mmHg - - - - 50.2(H)   HCO3 20.0 - 28.0 mmol/L - - 30.1(H) 30.5(H) 28.3(H)   TCO2 22 - 32 mmol/L - - 32 32 -   ACIDBASEDEF 0.0 - 2.0 mmol/L - - - - -   O2SAT % - - 98.0 64.0 91.8       Capillary Blood Glucose: Lab Results  Component Value Date   GLUCAP 145 (H) 10/30/2020   GLUCAP 97 10/24/2018   GLUCAP 95 07/16/2014   GLUCAP 87 07/16/2014   GLUCAP 89 07/15/2014     Pulmonary Assessment Scores:  Pulmonary Assessment Scores     Row Name 03/04/21 0823         ADL UCSD   ADL Phase Entry     SOB Score total 83       CAT Score   CAT Score 30       mMRC Score   mMRC Score 3             UCSD: Self-administered rating of dyspnea associated with activities of daily living (ADLs) 6-point scale (0 = "not at all" to 5 = "maximal or unable to do because of breathlessness")  Scoring Scores range from 0 to 120.  Minimally important difference is 5 units  CAT: CAT can identify the health impairment of COPD patients and is better correlated with disease progression.  CAT has a scoring range of zero to 40. The CAT score is classified into four groups of low (less than 10), medium (10 - 20), high (21-30) and very high (31-40) based on the impact level of disease on health status. A CAT score over 10 suggests significant symptoms.  A worsening CAT score could be explained by an exacerbation, poor medication adherence, poor inhaler technique, or progression of COPD or comorbid conditions.  CAT MCID is 2 points  mMRC: mMRC (Modified Medical Research Council) Dyspnea Scale is used to assess the degree of baseline functional disability in patients of respiratory disease  due to dyspnea. No minimal important difference is established. A decrease in score of 1 point or greater is considered a positive change.   Pulmonary Function Assessment:   Exercise Target Goals: Exercise Program Goal: Individual exercise prescription set using results from initial 6 min walk test and THRR while considering  patient's activity barriers and safety.   Exercise Prescription Goal:  Initial exercise prescription builds to 30-45 minutes a day of aerobic activity, 2-3 days per week.  Home exercise guidelines will be given to patient during program as part of exercise prescription that the participant will acknowledge.  Activity Barriers & Risk Stratification:  Activity Barriers & Cardiac Risk Stratification - 03/04/21 0827       Activity Barriers & Cardiac Risk Stratification   Activity Barriers Deconditioning;Shortness of Breath;Other (comment)    Comments diabetic neuropathy    Cardiac Risk Stratification High             6 Minute Walk:  6 Minute Walk     Row Name 03/04/21 0957         6 Minute Walk   Phase Initial     Distance 800 feet     Walk Time 6 minutes     # of Rest Breaks 3     MPH 1.52     METS 1.91     RPE 17     Perceived Dyspnea  17     VO2 Peak 6.67     Symptoms Yes (comment)     Comments 3 standing rest breaks due to SOB for 20 sec, 40 sec, and 20 sec     Resting HR 78 bpm     Resting BP 110/76     Resting Oxygen Saturation  99 %     Exercise Oxygen Saturation  during 6 min walk 93 %     Max Ex. HR 98 bpm     Max Ex. BP 120/78     2 Minute Post BP 100/68       Interval HR   1 Minute HR 91     2 Minute HR 97     3 Minute HR 98     4 Minute HR 93     5 Minute HR 95     6 Minute HR 96     2 Minute Post HR 84     Interval Heart Rate? Yes       Interval Oxygen   Interval Oxygen? Yes     Baseline Oxygen Saturation % 99 %     1 Minute Oxygen Saturation % 98 %     1 Minute Liters of Oxygen 4 L     2 Minute Oxygen Saturation % 95  %     2 Minute Liters of Oxygen 4 L     3 Minute Oxygen Saturation % 93 %     3 Minute Liters of Oxygen 4 L     4 Minute Oxygen Saturation % 96 %     4 Minute Liters of Oxygen 4 L     5 Minute Oxygen Saturation % 95 %     5 Minute Liters of Oxygen 4 L     6 Minute Oxygen Saturation % 95 %     6 Minute Liters of Oxygen 4 L     2 Minute Post Oxygen Saturation % 99 %     2 Minute Post Liters of Oxygen 4 L              Oxygen Initial Assessment:  Oxygen Initial Assessment - 03/04/21 0822       Home Oxygen   Home Oxygen Device E-Tanks;Home Concentrator    Sleep Oxygen Prescription CPAP    Liters per minute 0    Home Exercise Oxygen Prescription Continuous    Liters per minute 5   sometimes increases to  6   Home Resting Oxygen Prescription Continuous    Liters per minute 4    Compliance with Home Oxygen Use Yes             Oxygen Re-Evaluation:   Oxygen Discharge (Final Oxygen Re-Evaluation):   Initial Exercise Prescription:  Initial Exercise Prescription - 03/04/21 1000       Date of Initial Exercise RX and Referring Provider   Date 03/04/21    Referring Provider Dr. Maryellen Pile    Expected Discharge Date 07/15/21      Oxygen   Oxygen Continuous    Liters 4    Maintain Oxygen Saturation 88% or higher      NuStep   Level 1    SPM 80    Minutes 22      Arm Ergometer   Level 1    RPM 50    Minutes 17      Prescription Details   Frequency (times per week) 2    Duration Progress to 30 minutes of continuous aerobic without signs/symptoms of physical distress      Intensity   THRR 40-80% of Max Heartrate 60-121    Ratings of Perceived Exertion 11-13    Perceived Dyspnea 0-4      Resistance Training   Training Prescription Yes    Weight 3 lbs    Reps 10-15             Perform Capillary Blood Glucose checks as needed.  Exercise Prescription Changes:   Exercise Comments:   Exercise Goals and Review:   Exercise Goals     Row Name  03/04/21 1002             Exercise Goals   Increase Physical Activity Yes       Intervention Provide advice, education, support and counseling about physical activity/exercise needs.;Develop an individualized exercise prescription for aerobic and resistive training based on initial evaluation findings, risk stratification, comorbidities and participant's personal goals.       Expected Outcomes Short Term: Attend rehab on a regular basis to increase amount of physical activity.;Long Term: Exercising regularly at least 3-5 days a week.;Long Term: Add in home exercise to make exercise part of routine and to increase amount of physical activity.       Increase Strength and Stamina Yes       Intervention Provide advice, education, support and counseling about physical activity/exercise needs.;Develop an individualized exercise prescription for aerobic and resistive training based on initial evaluation findings, risk stratification, comorbidities and participant's personal goals.       Expected Outcomes Short Term: Increase workloads from initial exercise prescription for resistance, speed, and METs.;Short Term: Perform resistance training exercises routinely during rehab and add in resistance training at home;Long Term: Improve cardiorespiratory fitness, muscular endurance and strength as measured by increased METs and functional capacity ( )       Able to understand and use rate of perceived exertion (RPE) scale Yes       Intervention Provide education and explanation on how to use RPE scale       Expected Outcomes Short Term: Able to use RPE daily in rehab to express subjective intensity level;Long Term:  Able to use RPE to guide intensity level when exercising independently       Able to understand and use Dyspnea scale Yes       Intervention Provide education and explanation on how to use Dyspnea scale       Expected Outcomes Short  Term: Able to use Dyspnea scale daily in rehab to express  subjective sense of shortness of breath during exertion;Long Term: Able to use Dyspnea scale to guide intensity level when exercising independently       Knowledge and understanding of Target Heart Rate Range (THRR) Yes       Intervention Provide education and explanation of THRR including how the numbers were predicted and where they are located for reference       Expected Outcomes Short Term: Able to state/look up THRR;Long Term: Able to use THRR to govern intensity when exercising independently;Short Term: Able to use daily as guideline for intensity in rehab       Understanding of Exercise Prescription Yes       Intervention Provide education, explanation, and written materials on patient's individual exercise prescription       Expected Outcomes Short Term: Able to explain program exercise prescription;Long Term: Able to explain home exercise prescription to exercise independently                Exercise Goals Re-Evaluation :   Discharge Exercise Prescription (Final Exercise Prescription Changes):   Nutrition:  Target Goals: Understanding of nutrition guidelines, daily intake of sodium 1500mg , cholesterol 200mg , calories 30% from fat and 7% or less from saturated fats, daily to have 5 or more servings of fruits and vegetables.  Biometrics:  Pre Biometrics - 03/04/21 1002       Pre Biometrics   Height 5' 5.5" (1.664 m)    Weight 173 lb 8 oz (78.7 kg)    Waist Circumference 42.5 inches    Hip Circumference 39.5 inches    Waist to Hip Ratio 1.08 %    BMI (Calculated) 28.42    Triceps Skinfold 22 mm    % Body Fat 30.9 %    Grip Strength 23.5 kg    Flexibility 0 in    Single Leg Stand 5.25 seconds              Nutrition Therapy Plan and Nutrition Goals:   Nutrition Assessments:  Nutrition Assessments - 03/04/21 0829       MEDFICTS Scores   Pre Score 24            MEDIFICTS Score Key: ?70 Need to make dietary changes  40-70 Heart Healthy Diet ? 40  Therapeutic Level Cholesterol Diet   Picture Your Plate Scores: <35 Unhealthy dietary pattern with much room for improvement. 41-50 Dietary pattern unlikely to meet recommendations for good health and room for improvement. 51-60 More healthful dietary pattern, with some room for improvement.  >60 Healthy dietary pattern, although there may be some specific behaviors that could be improved.    Nutrition Goals Re-Evaluation:   Nutrition Goals Discharge (Final Nutrition Goals Re-Evaluation):   Psychosocial: Target Goals: Acknowledge presence or absence of significant depression and/or stress, maximize coping skills, provide positive support system. Participant is able to verbalize types and ability to use techniques and skills needed for reducing stress and depression.  Initial Review & Psychosocial Screening:  Initial Psych Review & Screening - 03/04/21 0828       Initial Review   Current issues with None Identified      Family Dynamics   Good Support System? Yes    Comments His main support system is his daughter, Asher Muir.      Barriers   Psychosocial barriers to participate in program There are no identifiable barriers or psychosocial needs.      Screening Interventions  Interventions Encouraged to exercise    Expected Outcomes Short Term goal: Identification and review with participant of any Quality of Life or Depression concerns found by scoring the questionnaire.;Long Term goal: The participant improves quality of Life and PHQ9 Scores as seen by post scores and/or verbalization of changes             Quality of Life Scores:  Quality of Life - 03/04/21 1008       Quality of Life   Select Quality of Life      Quality of Life Scores   Health/Function Pre 18.83 %    Socioeconomic Pre 18.38 %    Psych/Spiritual Pre 25.21 %    Family Pre 13.75 %    GLOBAL Pre 19.58 %            Scores of 19 and below usually indicate a poorer quality of life in these areas.   A difference of  2-3 points is a clinically meaningful difference.  A difference of 2-3 points in the total score of the Quality of Life Index has been associated with significant improvement in overall quality of life, self-image, physical symptoms, and general health in studies assessing change in quality of life.   PHQ-9: Recent Review Flowsheet Data     Depression screen [facility] 2/9 03/04/2021 10/23/2017 07/13/2017 05/23/2017 04/10/2014   Decreased Interest 0 0 0 0 0   Down, Depressed, Hopeless 2 0 1 0 0   PHQ - 2 Score 2 0 1 0 0   Altered sleeping 2 - 1 - -   Tired, decreased energy 3 - 0 - -   Change in appetite 2 - 0 - -   Feeling bad or failure about yourself  0 - 0 - -   Trouble concentrating 0 - 0 - -   Moving slowly or fidgety/restless 1 - 0 - -   Suicidal thoughts 0 - 0 - -   PHQ-9 Score 10 - 2 - -   Difficult doing work/chores Somewhat difficult - Not difficult at all - -      Interpretation of Total Score  Total Score Depression Severity:  1-4 = Minimal depression, 5-9 = Mild depression, 10-14 = Moderate depression, 15-19 = Moderately severe depression, 20-27 = Severe depression   Psychosocial Evaluation and Intervention:  Psychosocial Evaluation - 03/04/21 1003       Psychosocial Evaluation & Interventions   Interventions Encouraged to exercise with the program and follow exercise prescription    Comments Pt has no barriers to participating in cardiac rehab. He has no identifiable psychosocial issues. He does have PTSD from his time in the [employer] during [place], but he is treated with Mirtazapine 30 mg once per day and also sees a psychiatrist through the [place]. He reports that his PTSD is under control and is not an issue at this time. He scored a 10 on his PHQ-9. He attributes most of this to his SOB that stems from his COPD. He has a lack of energy and activity intolerance that negatively impacts him. His goals while in the program are to maintain his weight and decrease his SOB  with activity. He wants to get a lung reduction surgery to help with his COPD, but his pulmonologist wants him to get stronger and complete pulmonary rehab before they will consider him for the surgery. He is hopeful that the program will get him stronger and that he will be able to get the surgery. He is eager  to start the program.    Expected Outcomes Pt will continue to not have any identfiable psychosocial issues.    Continue Psychosocial Services  No Follow up required             Psychosocial Re-Evaluation:   Psychosocial Discharge (Final Psychosocial Re-Evaluation):    Education: Education Goals: Education classes will be provided on a weekly basis, covering required topics. Participant will state understanding/return demonstration of topics presented.  Learning Barriers/Preferences:  Learning Barriers/Preferences - 03/04/21 0830       Learning Barriers/Preferences   Learning Barriers None    Learning Preferences Written Material             Education Topics: How Lungs Work and Diseases: - Discuss the anatomy of the lungs and diseases that can affect the lungs, such as COPD.   Exercise: -Discuss the importance of exercise, FITT principles of exercise, normal and abnormal responses to exercise, and how to exercise safely.   Environmental Irritants: -Discuss types of environmental irritants and how to limit exposure to environmental irritants.   Meds/Inhalers and oxygen: - Discuss respiratory medications, definition of an inhaler and oxygen, and the proper way to use an inhaler and oxygen.   Energy Saving Techniques: - Discuss methods to conserve energy and decrease shortness of breath when performing activities of daily living.    Bronchial Hygiene / Breathing Techniques: - Discuss breathing mechanics, pursed-lip breathing technique,  proper posture, effective ways to clear airways, and other functional breathing techniques   Cleaning Equipment: -  Provides group verbal and written instruction about the health risks of elevated stress, cause of high stress, and healthy ways to reduce stress.   Nutrition I: Fats: - Discuss the types of cholesterol, what cholesterol does to the body, and how cholesterol levels can be controlled.   Nutrition II: Labels: -Discuss the different components of food labels and how to read food labels.   Respiratory Infections: - Discuss the signs and symptoms of respiratory infections, ways to prevent respiratory infections, and the importance of seeking medical treatment when having a respiratory infection.   Stress I: Signs and Symptoms: - Discuss the causes of stress, how stress may lead to anxiety and depression, and ways to limit stress.   Stress II: Relaxation: -Discuss relaxation techniques to limit stress.   Oxygen for Home/Travel: - Discuss how to prepare for travel when on oxygen and proper ways to transport and store oxygen to ensure safety.   Knowledge Questionnaire Score:  Knowledge Questionnaire Score - 03/04/21 0830       Knowledge Questionnaire Score   Pre Score 14/18             Core Components/Risk Factors/Patient Goals at Admission:  Personal Goals and Risk Factors at Admission - 03/04/21 0834       Core Components/Risk Factors/Patient Goals on Admission    Weight Management Yes;Weight Maintenance    Intervention Weight Management: Develop a combined nutrition and exercise program designed to reach desired caloric intake, while maintaining appropriate intake of nutrient and fiber, sodium and fats, and appropriate energy expenditure required for the weight goal.;Weight Management: Provide education and appropriate resources to help participant work on and attain dietary goals.    Expected Outcomes Weight Maintenance: Understanding of the daily nutrition guidelines, which includes 25-35% calories from fat, 7% or less cal from saturated fats, less than 200mg  cholesterol,  less than 1.5gm of sodium, & 5 or more servings of fruits and vegetables daily    Improve shortness  of breath with ADL's Yes    Intervention Provide education, individualized exercise plan and daily activity instruction to help decrease symptoms of SOB with activities of daily living.    Expected Outcomes Short Term: Improve cardiorespiratory fitness to achieve a reduction of symptoms when performing ADLs;Long Term: Be able to perform more ADLs without symptoms or delay the onset of symptoms    Personal Goal Other Yes    Personal Goal Be fit enough to be a candidate for lung reduction surgery.    Intervention Attend rehab twice per week and begin home exercise.    Expected Outcomes The patient will be deemed healthy enough by a surgeon to perform a lung reduction surgery.             Core Components/Risk Factors/Patient Goals Review:    Core Components/Risk Factors/Patient Goals at Discharge (Final Review):    ITP Comments:   Comments: Patient arrived for 1st visit/orientation/education at 0800. Patient was referred to PR by Dr. Maryellen Pile due to COPD (J44.9). During orientation advised patient on arrival and appointment times what to wear, what to do before, during and after exercise. Reviewed attendance and class policy.  Pt is scheduled to return Pulmonary Rehab on 03/16/2021 at 1045. Pt was advised to come to class 15 minutes before class starts.  Discussed RPE/Dpysnea scales. Patient participated in warm up stretches. Patient was able to complete 6 minute walk test. Patient was measured for the equipment. Discussed equipment safety with patient. Took patient pre-anthropometric measurements. Patient finished visit at 0900.

## 2021-03-10 ENCOUNTER — Telehealth: Payer: Self-pay | Admitting: Internal Medicine

## 2021-03-10 MED ORDER — COMBIVENT RESPIMAT 20-100 MCG/ACT IN AERS: 1.0000 | INHALATION_SPRAY | Freq: Four times a day (QID) | RESPIRATORY_TRACT | 6 refills | Status: DC

## 2021-03-10 NOTE — Telephone Encounter (Signed)
MW please advise if ok to send in combivent to the pharmacy per pts request.  thanks

## 2021-03-10 NOTE — Telephone Encounter (Signed)
Fine with me although they are the same medication so don't take both

## 2021-03-10 NOTE — Telephone Encounter (Signed)
Called and spoke with pt and he is aware per MW not to use both inhalers.  He stated that he is out of the stiolto as this didn't work for him.  I have updated his med list and sent the combivent to the pharmacy.  Nothing further is needed.

## 2021-03-16 ENCOUNTER — Encounter (HOSPITAL_COMMUNITY)
Admission: RE | Admit: 2021-03-16 | Discharge: 2021-03-16 | Disposition: A | Discharge status: Home / Self Care | Payer: No Typology Code available for payment source | Source: Ambulatory Visit | Attending: Pathology | Admitting: Pathology

## 2021-03-16 VITALS — Wt 174.2 lb

## 2021-03-16 DIAGNOSIS — J449 Chronic obstructive pulmonary disease, unspecified: Secondary | ICD-10-CM

## 2021-03-16 NOTE — Progress Notes (Signed)
Daily Session Note  Patient Details  Name: Gerald Williams MRN: [id number] Date of Birth: [birth year] Referring Provider:   Flowsheet Row PULMONARY REHAB COPD ORIENTATION from 03/04/2021 in [place]  Referring Provider Dr. Spero Curb       Encounter Date: 03/16/2021  Check In:  Session Check In - 03/16/21 1045       Check-In   Supervising physician immediately available to respond to emergencies [facility] MD immediately available    Physician(s) Dr. Harl Bowie    Location [facility]    Staff Present Hoy Register, MS, ACSM-CEP, Exercise Physiologist;Heather Zigmund Daniel, Exercise Physiologist    Virtual Visit No    Medication changes reported     No    Fall or balance concerns reported    Yes    Tobacco Cessation No Change    Warm-up and Cool-down Performed as group-led instruction    Resistance Training Performed Yes    VAD Patient? No    PAD/SET Patient? No      Pain Assessment   Currently in Pain? No/denies    Multiple Pain Sites No             Capillary Blood Glucose: No results found for this or any previous visit (from the past 24 hour(s)).    Social History   Tobacco Use  Smoking Status Former   Packs/day: 1.50   Years: 50.00   Pack years: 75.00   Types: Cigarettes   Start date: 04/26/1963   Quit date: 09/22/2013   Years since quitting: 7.4  Smokeless Tobacco Never  Tobacco Comments   patient using the patch, losenges, and medication    Goals Met:  Independence with exercise equipment Exercise tolerated well No report of concerns or symptoms today Strength training completed today  Goals Unmet:  Not Applicable  Comments: checkout time is 1145   Dr. Kathie Dike is Medical Director for [facility].

## 2021-03-18 ENCOUNTER — Encounter (HOSPITAL_COMMUNITY): Payer: No Typology Code available for payment source

## 2021-03-23 ENCOUNTER — Encounter (HOSPITAL_COMMUNITY)
Admission: RE | Admit: 2021-03-23 | Discharge: 2021-03-23 | Disposition: A | Discharge status: Home / Self Care | Payer: No Typology Code available for payment source | Source: Ambulatory Visit | Attending: Pathology | Admitting: Pathology

## 2021-03-23 DIAGNOSIS — J449 Chronic obstructive pulmonary disease, unspecified: Secondary | ICD-10-CM

## 2021-03-23 NOTE — Progress Notes (Signed)
Daily Session Note  Patient Details  Name: Gerald Williams MRN: [id number] Date of Birth: 1951/01/28 Referring Provider:   Flowsheet Row PULMONARY REHAB COPD ORIENTATION from 03/04/2021 in [place]  Referring Provider Dr. Spero Curb       Encounter Date: 03/23/2021  Check In:  Session Check In - 03/23/21 1045       Check-In   Supervising physician immediately available to respond to emergencies [facility] MD immediately available    Physician(s) Dr. Johney Frame    Location [facility]    Staff Present Geanie Cooley, RN;Heather Otho Ket, BS, Exercise Physiologist;Dalton Kris Mouton, MS, ACSM-CEP, Exercise Physiologist    Virtual Visit No    Medication changes reported     No    Fall or balance concerns reported    Yes    Comments He has fallen once this year due to his right leg "giving out". He also has diabetic neuropathy that alters his balance as well.    Tobacco Cessation No Change    Warm-up and Cool-down Performed as group-led instruction    Resistance Training Performed Yes    VAD Patient? No    PAD/SET Patient? No      Pain Assessment   Currently in Pain? No/denies    Multiple Pain Sites No             Capillary Blood Glucose: No results found for this or any previous visit (from the past 24 hour(s)).    Social History   Tobacco Use  Smoking Status Former   Packs/day: 1.50   Years: 50.00   Pack years: 75.00   Types: Cigarettes   Start date: 04/26/1963   Quit date: 09/22/2013   Years since quitting: 7.5  Smokeless Tobacco Never  Tobacco Comments   patient using the patch, losenges, and medication    Goals Met:  Proper associated with RPD/PD & O2 Sat Independence with exercise equipment Using PLB without cueing & demonstrates good technique Exercise tolerated well No report of concerns or symptoms today Strength training completed today  Goals Unmet:  Not Applicable  Comments: check out @ 11:45am   Dr.  Kathie Dike is Medical Director for [facility].

## 2021-03-25 ENCOUNTER — Encounter (HOSPITAL_COMMUNITY)
Admission: RE | Admit: 2021-03-25 | Discharge: 2021-03-25 | Disposition: A | Discharge status: Home / Self Care | Payer: No Typology Code available for payment source | Source: Ambulatory Visit | Attending: Pathology | Admitting: Pathology

## 2021-03-25 DIAGNOSIS — J449 Chronic obstructive pulmonary disease, unspecified: Secondary | ICD-10-CM | POA: Diagnosis present

## 2021-03-25 NOTE — Progress Notes (Signed)
Daily Session Note  Patient Details  Name: Gerald Williams MRN: [id number] Date of Birth: 1950-09-21 Referring Provider:   Flowsheet Row PULMONARY REHAB COPD ORIENTATION from 03/04/2021 in [place]  Referring Provider Dr. Spero Curb       Encounter Date: 03/25/2021  Check In:  Session Check In - 03/25/21 1045       Check-In   Supervising physician immediately available to respond to emergencies [facility] MD immediately available    Physician(s) Dr. Domenic Polite    Location [facility]    Staff Present Hoy Register, MS, ACSM-CEP, Exercise Physiologist;Heather Zigmund Daniel, Exercise Physiologist    Virtual Visit No    Medication changes reported     No    Fall or balance concerns reported    Yes    Comments He has fallen once this year due to his right leg "giving out". He also has diabetic neuropathy that alters his balance as well.    Tobacco Cessation No Change    Warm-up and Cool-down Performed as group-led instruction    Resistance Training Performed Yes    VAD Patient? No    PAD/SET Patient? No      Pain Assessment   Currently in Pain? No/denies    Multiple Pain Sites No             Capillary Blood Glucose: No results found for this or any previous visit (from the past 24 hour(s)).    Social History   Tobacco Use  Smoking Status Former   Packs/day: 1.50   Years: 50.00   Pack years: 75.00   Types: Cigarettes   Start date: 04/26/1963   Quit date: 09/22/2013   Years since quitting: 7.5  Smokeless Tobacco Never  Tobacco Comments   patient using the patch, losenges, and medication    Goals Met:  Independence with exercise equipment Exercise tolerated well No report of concerns or symptoms today Strength training completed today  Goals Unmet:  Not Applicable  Comments: checkout time is 1145   Dr. Kathie Dike is Medical Director for [facility].

## 2021-03-30 ENCOUNTER — Encounter (HOSPITAL_COMMUNITY): Payer: No Typology Code available for payment source

## 2021-03-31 NOTE — Progress Notes (Signed)
Pulmonary Individual Treatment Plan  Patient Details  Name: Gerald Williams MRN: [id number] Date of Birth: 12/09/1950 Referring Provider:   Flowsheet Row PULMONARY REHAB COPD ORIENTATION from 03/04/2021 in [facility] CARDIAC REHABILITATION  Referring Provider Dr. Maryellen Pile       Initial Encounter Date:  Flowsheet Row PULMONARY REHAB COPD ORIENTATION from 03/04/2021 in [facility]IAC RE[facility]  Date 03/04/21       Visit Diagnosis: Chronic obstructive pulmonary disease, unspecified COPD type (HCC)  Patient's Home Medications on Admission:   Current Outpatient Medications:    acetaminophen (TYLENOL) 500 MG tablet, Take 1,000 mg by mouth every 6 (six) hours as needed for mild pain, fever or headache., Disp: , Rfl:    albuterol (PROVENTIL) (2.5 MG/3ML) 0.083% nebulizer solution, Take 2.5 mg by nebulization every 6 (six) hours as needed for wheezing or shortness of breath., Disp: , Rfl:    albuterol (VENTOLIN HFA) 108 (90 Base) MCG/ACT inhaler, Inhale 2 puffs into the lungs every 6 (six) hours as needed for shortness of breath or wheezing., Disp: 18 g, Rfl: 1   atorvastatin (LIPITOR) 80 MG tablet, Take 80 mg by mouth daily., Disp: , Rfl:    diclofenac Sodium (VOLTAREN) 1 % GEL, Apply 2 g topically 4 (four) times daily., Disp: 150 g, Rfl: 1   DULoxetine (CYMBALTA) 60 MG capsule, TAKE ONE CAPSULE BY MOUTH TWO TIMES A DAY FOR POSTTRAUMATIC STRESS DISORDER AND DEPRESSION, Disp: , Rfl:    fluticasone (FLONASE) 50 MCG/ACT nasal spray, INSTILL 2 SPRAYS INTO EACH NOSTRIL EVERY DAY MAXIMUM 2 SPRAYS IN EACH NOSTRIL DAILY. FOR ALLERGIES, Disp: , Rfl:    furosemide (LASIX) 20 MG tablet, Take 1 tablet (20 mg total) by mouth daily., Disp: 90 tablet, Rfl: 1   glucose blood (COOL BLOOD GLUCOSE TEST STRIPS) test strip, Used to test blood sugar x2 daily---diagnosis code r73.03--for one touch verio flex, Disp: 200 each, Rfl: 3   guaiFENesin (MUCINEX) 600 MG 12 hr tablet, Take 600 mg by mouth 2 (two)  times daily as needed., Disp: , Rfl:    ipratropium (ATROVENT) 0.03 % nasal spray, Place 1 spray into both nostrils every 8 (eight) hours as needed for rhinitis., Disp: 30 mL, Rfl: 5   Ipratropium-Albuterol (COMBIVENT RESPIMAT) 20-100 MCG/ACT AERS respimat, Inhale 1 puff into the lungs every 6 (six) hours., Disp: 4 g, Rfl: 6   Lancets MISC, Used to test blood sugar x2 daily---diagnosis code r73.03--for one touch verio flex, Disp: 200 each, Rfl: 3   losartan (COZAAR) 25 MG tablet, Take 25 mg by mouth daily., Disp: , Rfl:    metoprolol succinate (TOPROL-XL) 50 MG 24 hr tablet, Take 1 tablet (50 mg total) by mouth daily. Take with or immediately following a meal., Disp: 90 tablet, Rfl: 1   mirtazapine (REMERON) 30 MG tablet, Take 30 mg by mouth at bedtime., Disp: , Rfl:    OXYGEN, Inhale 4-5 L into the lungs continuous., Disp: , Rfl:    potassium chloride SA (KLOR-CON) 10 MEQ tablet, Take 1 tablet (10 mEq total) by mouth daily., Disp: 90 tablet, Rfl: 3   spironolactone (ALDACTONE) 25 MG tablet, Take 1 tablet (25 mg total) by mouth daily., Disp: 90 tablet, Rfl: 1  Past Medical History: Past Medical History:  Diagnosis Date   CHF (congestive heart failure) (HCC)    Emphysema    Erectile dysfunction    Hyperlipemia    Hypertension    Polysubstance abuse (HCC)    stopped using crack/cocaine December 4th 20-14  PTSD (post-traumatic stress disorder)    Sleep apnea     Tobacco Use: Social History   Tobacco Use  Smoking Status Former   Packs/day: 1.50   Years: 50.00   Pack years: 75.00   Types: Cigarettes   Start date: 04/26/1963   Quit date: 09/22/2013   Years since quitting: 7.5  Smokeless Tobacco Never  Tobacco Comments   patient using the patch, losenges, and medication    Labs: Recent Review Flowsheet Data     Labs for ITP Cardiac and Pulmonary Rehab Latest Ref Rng & Units 05/07/2019 10/29/2020 10/30/2020 10/30/2020 10/31/2020   Cholestrol 0 - 200 mg/dL 161(W) 960(A) - - -   LDLCALC 0 -  99 mg/dL 540(J) 811(B) - - -   HDL >40 mg/dL 59 79 - - -   Trlycerides <150 mg/dL 84 92 - - -   Hemoglobin A1c 4.8 - 5.6 % 6.1(H) - - - -   PHART 7.350 - 7.450 - - - - 7.371   PCO2ART 32.0 - 48.0 mmHg - - - - 50.2(H)   HCO3 20.0 - 28.0 mmol/L - - 30.1(H) 30.5(H) 28.3(H)   TCO2 22 - 32 mmol/L - - 32 32 -   ACIDBASEDEF 0.0 - 2.0 mmol/L - - - - -   O2SAT % - - 98.0 64.0 91.8       Capillary Blood Glucose: Lab Results  Component Value Date   GLUCAP 145 (H) 10/30/2020   GLUCAP 97 10/24/2018   GLUCAP 95 07/16/2014   GLUCAP 87 07/16/2014   GLUCAP 89 07/15/2014     Pulmonary Assessment Scores:  Pulmonary Assessment Scores     Row Name 03/04/21 0823         ADL UCSD   ADL Phase Entry     SOB Score total 83       CAT Score   CAT Score 30       mMRC Score   mMRC Score 3             UCSD: Self-administered rating of dyspnea associated with activities of daily living (ADLs) 6-point scale (0 = "not at all" to 5 = "maximal or unable to do because of breathlessness")  Scoring Scores range from 0 to 120.  Minimally important difference is 5 units  CAT: CAT can identify the health impairment of COPD patients and is better correlated with disease progression.  CAT has a scoring range of zero to 40. The CAT score is classified into four groups of low (less than 10), medium (10 - 20), high (21-30) and very high (31-40) based on the impact level of disease on health status. A CAT score over 10 suggests significant symptoms.  A worsening CAT score could be explained by an exacerbation, poor medication adherence, poor inhaler technique, or progression of COPD or comorbid conditions.  CAT MCID is 2 points  mMRC: mMRC (Modified Medical Research Council) Dyspnea Scale is used to assess the degree of baseline functional disability in patients of respiratory disease due to dyspnea. No minimal important difference is established. A decrease in score of 1 point or greater is considered a  positive change.   Pulmonary Function Assessment:   Exercise Target Goals: Exercise Program Goal: Individual exercise prescription set using results from initial 6 min walk test and THRR while considering  patient's activity barriers and safety.   Exercise Prescription Goal: Initial exercise prescription builds to 30-45 minutes a day of aerobic activity, 2-3 days per week.  Home  exercise guidelines will be given to patient during program as part of exercise prescription that the participant will acknowledge.  Activity Barriers & Risk Stratification:  Activity Barriers & Cardiac Risk Stratification - 03/04/21 0827       Activity Barriers & Cardiac Risk Stratification   Activity Barriers Deconditioning;Shortness of Breath;Other (comment)    Comments diabetic neuropathy    Cardiac Risk Stratification High             6 Minute Walk:  6 Minute Walk     Row Name 03/04/21 0957         6 Minute Walk   Phase Initial     Distance 800 feet     Walk Time 6 minutes     # of Rest Breaks 3     MPH 1.52     METS 1.91     RPE 17     Perceived Dyspnea  17     VO2 Peak 6.67     Symptoms Yes (comment)     Comments 3 standing rest breaks due to SOB for 20 sec, 40 sec, and 20 sec     Resting HR 78 bpm     Resting BP 110/76     Resting Oxygen Saturation  99 %     Exercise Oxygen Saturation  during 6 min walk 93 %     Max Ex. HR 98 bpm     Max Ex. BP 120/78     2 Minute Post BP 100/68       Interval HR   1 Minute HR 91     2 Minute HR 97     3 Minute HR 98     4 Minute HR 93     5 Minute HR 95     6 Minute HR 96     2 Minute Post HR 84     Interval Heart Rate? Yes       Interval Oxygen   Interval Oxygen? Yes     Baseline Oxygen Saturation % 99 %     1 Minute Oxygen Saturation % 98 %     1 Minute Liters of Oxygen 4 L     2 Minute Oxygen Saturation % 95 %     2 Minute Liters of Oxygen 4 L     3 Minute Oxygen Saturation % 93 %     3 Minute Liters of Oxygen 4 L     4  Minute Oxygen Saturation % 96 %     4 Minute Liters of Oxygen 4 L     5 Minute Oxygen Saturation % 95 %     5 Minute Liters of Oxygen 4 L     6 Minute Oxygen Saturation % 95 %     6 Minute Liters of Oxygen 4 L     2 Minute Post Oxygen Saturation % 99 %     2 Minute Post Liters of Oxygen 4 L              Oxygen Initial Assessment:  Oxygen Initial Assessment - 03/04/21 0822       Home Oxygen   Home Oxygen Device E-Tanks;Home Concentrator    Sleep Oxygen Prescription CPAP    Liters per minute 0    Home Exercise Oxygen Prescription Continuous    Liters per minute 5   sometimes increases to 6   Home Resting Oxygen Prescription Continuous    Liters per minute 4  Compliance with Home Oxygen Use Yes             Oxygen Re-Evaluation:  Oxygen Re-Evaluation     Row Name 03/30/21 1540             Program Oxygen Prescription   Program Oxygen Prescription Continuous;E-Tanks       Liters per minute 3         Home Oxygen   Home Oxygen Device E-Tanks;Home Concentrator       Sleep Oxygen Prescription CPAP       Home Exercise Oxygen Prescription Continuous       Liters per minute 5       Home Resting Oxygen Prescription Continuous       Liters per minute 4       Compliance with Home Oxygen Use Yes         Goals/Expected Outcomes   Short Term Goals To learn and demonstrate proper pursed lip breathing techniques or other breathing techniques. ;To learn and demonstrate proper use of respiratory medications;To learn and understand importance of maintaining oxygen saturations>88%;To learn and understand importance of monitoring SPO2 with pulse oximeter and demonstrate accurate use of the pulse oximeter.;To learn and exhibit compliance with exercise, home and travel O2 prescription       Long  Term Goals Exhibits compliance with exercise, home  and travel O2 prescription;Verbalizes importance of monitoring SPO2 with pulse oximeter and return demonstration;Maintenance of O2  saturations>88%;Exhibits proper breathing techniques, such as pursed lip breathing or other method taught during program session;Compliance with respiratory medication;Demonstrates proper use of MDI's       Goals/Expected Outcomes compliance                Oxygen Discharge (Final Oxygen Re-Evaluation):  Oxygen Re-Evaluation - 03/30/21 1540       Program Oxygen Prescription   Program Oxygen Prescription Continuous;E-Tanks    Liters per minute 3      Home Oxygen   Home Oxygen Device E-Tanks;Home Concentrator    Sleep Oxygen Prescription CPAP    Home Exercise Oxygen Prescription Continuous    Liters per minute 5    Home Resting Oxygen Prescription Continuous    Liters per minute 4    Compliance with Home Oxygen Use Yes      Goals/Expected Outcomes   Short Term Goals To learn and demonstrate proper pursed lip breathing techniques or other breathing techniques. ;To learn and demonstrate proper use of respiratory medications;To learn and understand importance of maintaining oxygen saturations>88%;To learn and understand importance of monitoring SPO2 with pulse oximeter and demonstrate accurate use of the pulse oximeter.;To learn and exhibit compliance with exercise, home and travel O2 prescription    Long  Term Goals Exhibits compliance with exercise, home  and travel O2 prescription;Verbalizes importance of monitoring SPO2 with pulse oximeter and return demonstration;Maintenance of O2 saturations>88%;Exhibits proper breathing techniques, such as pursed lip breathing or other method taught during program session;Compliance with respiratory medication;Demonstrates proper use of MDI's    Goals/Expected Outcomes compliance             Initial Exercise Prescription:  Initial Exercise Prescription - 03/04/21 1000       Date of Initial Exercise RX and Referring Provider   Date 03/04/21    Referring Provider Dr. Maryellen Pile    Expected Discharge Date 07/15/21      Oxygen   Oxygen  Continuous    Liters 4    Maintain Oxygen Saturation 88% or higher  NuStep   Level 1    SPM 80    Minutes 22      Arm Ergometer   Level 1    RPM 50    Minutes 17      Prescription Details   Frequency (times per week) 2    Duration Progress to 30 minutes of continuous aerobic without signs/symptoms of physical distress      Intensity   THRR 40-80% of Max Heartrate 60-121    Ratings of Perceived Exertion 11-13    Perceived Dyspnea 0-4      Resistance Training   Training Prescription Yes    Weight 3 lbs    Reps 10-15             Perform Capillary Blood Glucose checks as needed.  Exercise Prescription Changes:   Exercise Prescription Changes     Row Name 03/16/21 1200 03/25/21 1531           Response to Exercise   Blood Pressure (Admit) 136/80 140/76      Blood Pressure (Exercise) 160/80 160/84      Blood Pressure (Exit) 130/60 138/76      Heart Rate (Admit) 94 bpm 83 bpm      Heart Rate (Exercise) 106 bpm 102 bpm      Heart Rate (Exit) 95 bpm 90 bpm      Oxygen Saturation (Admit) 100 % 100 %      Oxygen Saturation (Exercise) 100 % 100 %      Oxygen Saturation (Exit) 100 % 100 %      Rating of Perceived Exertion (Exercise) 15 15      Perceived Dyspnea (Exercise) 15 15      Duration Continue with 30 min of aerobic exercise without signs/symptoms of physical distress. Continue with 30 min of aerobic exercise without signs/symptoms of physical distress.      Intensity THRR unchanged THRR unchanged        Progression   Progression Continue to progress workloads to maintain intensity without signs/symptoms of physical distress. Continue to progress workloads to maintain intensity without signs/symptoms of physical distress.        Resistance Training   Training Prescription Yes Yes      Weight 3 lbs 2 lbs      Reps 10-15 10-15      Time 10 Minutes 10 Minutes        Oxygen   Oxygen Continuous Continuous      Liters 4 4        NuStep   Level 1 1       SPM 89 73      Minutes 17 17      METs 1.9 1.7        Arm Ergometer   Level 1 1      RPM 37 30      Minutes 22 22      METs 1.5 1.5               Exercise Comments:   Exercise Goals and Review:   Exercise Goals     Row Name 03/04/21 1002 03/30/21 1533           Exercise Goals   Increase Physical Activity Yes Yes      Intervention Provide advice, education, support and counseling about physical activity/exercise needs.;Develop an individualized exercise prescription for aerobic and resistive training based on initial evaluation findings, risk stratification, comorbidities and participant's personal goals. Provide advice, education, support  and counseling about physical activity/exercise needs.;Develop an individualized exercise prescription for aerobic and resistive training based on initial evaluation findings, risk stratification, comorbidities and participant's personal goals.      Expected Outcomes Short Term: Attend rehab on a regular basis to increase amount of physical activity.;Long Term: Exercising regularly at least 3-5 days a week.;Long Term: Add in home exercise to make exercise part of routine and to increase amount of physical activity. Short Term: Attend rehab on a regular basis to increase amount of physical activity.;Long Term: Exercising regularly at least 3-5 days a week.;Long Term: Add in home exercise to make exercise part of routine and to increase amount of physical activity.      Increase Strength and Stamina Yes Yes      Intervention Provide advice, education, support and counseling about physical activity/exercise needs.;Develop an individualized exercise prescription for aerobic and resistive training based on initial evaluation findings, risk stratification, comorbidities and participant's personal goals. Provide advice, education, support and counseling about physical activity/exercise needs.;Develop an individualized exercise prescription for aerobic  and resistive training based on initial evaluation findings, risk stratification, comorbidities and participant's personal goals.      Expected Outcomes Short Term: Increase workloads from initial exercise prescription for resistance, speed, and METs.;Short Term: Perform resistance training exercises routinely during rehab and add in resistance training at home;Long Term: Improve cardiorespiratory fitness, muscular endurance and strength as measured by increased METs and functional capacity ( ) Short Term: Increase workloads from initial exercise prescription for resistance, speed, and METs.;Short Term: Perform resistance training exercises routinely during rehab and add in resistance training at home;Long Term: Improve cardiorespiratory fitness, muscular endurance and strength as measured by increased METs and functional capacity ( )      Able to understand and use rate of perceived exertion (RPE) scale Yes Yes      Intervention Provide education and explanation on how to use RPE scale Provide education and explanation on how to use RPE scale      Expected Outcomes Short Term: Able to use RPE daily in rehab to express subjective intensity level;Long Term:  Able to use RPE to guide intensity level when exercising independently Short Term: Able to use RPE daily in rehab to express subjective intensity level;Long Term:  Able to use RPE to guide intensity level when exercising independently      Able to understand and use Dyspnea scale Yes Yes      Intervention Provide education and explanation on how to use Dyspnea scale Provide education and explanation on how to use Dyspnea scale      Expected Outcomes Short Term: Able to use Dyspnea scale daily in rehab to express subjective sense of shortness of breath during exertion;Long Term: Able to use Dyspnea scale to guide intensity level when exercising independently Short Term: Able to use Dyspnea scale daily in rehab to express subjective sense of shortness  of breath during exertion;Long Term: Able to use Dyspnea scale to guide intensity level when exercising independently      Knowledge and understanding of Target Heart Rate Range (THRR) Yes Yes      Intervention Provide education and explanation of THRR including how the numbers were predicted and where they are located for reference Provide education and explanation of THRR including how the numbers were predicted and where they are located for reference      Expected Outcomes Short Term: Able to state/look up THRR;Long Term: Able to use THRR to govern intensity when exercising independently;Short Term: Able to  use daily as guideline for intensity in rehab Short Term: Able to state/look up THRR;Long Term: Able to use THRR to govern intensity when exercising independently;Short Term: Able to use daily as guideline for intensity in rehab      Understanding of Exercise Prescription Yes Yes      Intervention Provide education, explanation, and written materials on patient's individual exercise prescription Provide education, explanation, and written materials on patient's individual exercise prescription      Expected Outcomes Short Term: Able to explain program exercise prescription;Long Term: Able to explain home exercise prescription to exercise independently Short Term: Able to explain program exercise prescription;Long Term: Able to explain home exercise prescription to exercise independently               Exercise Goals Re-Evaluation :  Exercise Goals Re-Evaluation     Row Name 03/30/21 1534             Exercise Goal Re-Evaluation   Exercise Goals Review Increase Physical Activity;Increase Strength and Stamina;Able to understand and use rate of perceived exertion (RPE) scale;Able to understand and use Dyspnea scale;Able to check pulse independently;Understanding of Exercise Prescription       Comments Pt has completed 3 sessions of pulmonary rehab. Pt is motivated but deconditioned so  progression will be slow. Pt keeps increasing O2 during class from 4L to 6L. He is currently exercising at 1.7 METs on the stepper. Will continue to montior and progress as able.       Expected Outcomes Through exercise at rehab and at home, the patient will reach their stated goals.                Discharge Exercise Prescription (Final Exercise Prescription Changes):  Exercise Prescription Changes - 03/25/21 1531       Response to Exercise   Blood Pressure (Admit) 140/76    Blood Pressure (Exercise) 160/84    Blood Pressure (Exit) 138/76    Heart Rate (Admit) 83 bpm    Heart Rate (Exercise) 102 bpm    Heart Rate (Exit) 90 bpm    Oxygen Saturation (Admit) 100 %    Oxygen Saturation (Exercise) 100 %    Oxygen Saturation (Exit) 100 %    Rating of Perceived Exertion (Exercise) 15    Perceived Dyspnea (Exercise) 15    Duration Continue with 30 min of aerobic exercise without signs/symptoms of physical distress.    Intensity THRR unchanged      Progression   Progression Continue to progress workloads to maintain intensity without signs/symptoms of physical distress.      Resistance Training   Training Prescription Yes    Weight 2 lbs    Reps 10-15    Time 10 Minutes      Oxygen   Oxygen Continuous    Liters 4      NuStep   Level 1    SPM 73    Minutes 17    METs 1.7      Arm Ergometer   Level 1    RPM 30    Minutes 22    METs 1.5             Nutrition:  Target Goals: Understanding of nutrition guidelines, daily intake of sodium 1500mg , cholesterol 200mg , calories 30% from fat and 7% or less from saturated fats, daily to have 5 or more servings of fruits and vegetables.  Biometrics:  Pre Biometrics - 03/04/21 1002       Pre Biometrics  Height 5' 5.5" (1.664 m)    Weight 78.7 kg    Waist Circumference 42.5 inches    Hip Circumference 39.5 inches    Waist to Hip Ratio 1.08 %    BMI (Calculated) 28.42    Triceps Skinfold 22 mm    % Body Fat 30.9 %     Grip Strength 23.5 kg    Flexibility 0 in    Single Leg Stand 5.25 seconds              Nutrition Therapy Plan and Nutrition Goals:  Nutrition Therapy & Goals - 03/25/21 1358       Personal Nutrition Goals   Comments Will continue to provide heart healthy nutritional education through handouts.  Weight is at 78.6kg .  We provide two educational sessions and also assistance with RD referrals if patient is interested.      Intervention Plan   Intervention Nutrition handout(s) given to patient.    Expected Outcomes Short Term Goal: Understand basic principles of dietary content, such as calories, fat, sodium, cholesterol and nutrients.             Nutrition Assessments:  Nutrition Assessments - 03/04/21 0829       MEDFICTS Scores   Pre Score 24            MEDIFICTS Score Key: ?70 Need to make dietary changes  40-70 Heart Healthy Diet ? 40 Therapeutic Level Cholesterol Diet   Picture Your Plate Scores: <40 Unhealthy dietary pattern with much room for improvement. 41-50 Dietary pattern unlikely to meet recommendations for good health and room for improvement. 51-60 More healthful dietary pattern, with some room for improvement.  >60 Healthy dietary pattern, although there may be some specific behaviors that could be improved.    Nutrition Goals Re-Evaluation:   Nutrition Goals Discharge (Final Nutrition Goals Re-Evaluation):   Psychosocial: Target Goals: Acknowledge presence or absence of significant depression and/or stress, maximize coping skills, provide positive support system. Participant is able to verbalize types and ability to use techniques and skills needed for reducing stress and depression.  Initial Review & Psychosocial Screening:  Initial Psych Review & Screening - 03/04/21 0828       Initial Review   Current issues with None Identified      Family Dynamics   Good Support System? Yes    Comments His main support system is his  daughter, Asher Muir.      Barriers   Psychosocial barriers to participate in program There are no identifiable barriers or psychosocial needs.      Screening Interventions   Interventions Encouraged to exercise    Expected Outcomes Short Term goal: Identification and review with participant of any Quality of Life or Depression concerns found by scoring the questionnaire.;Long Term goal: The participant improves quality of Life and PHQ9 Scores as seen by post scores and/or verbalization of changes             Quality of Life Scores:  Quality of Life - 03/04/21 1008       Quality of Life   Select Quality of Life      Quality of Life Scores   Health/Function Pre 18.83 %    Socioeconomic Pre 18.38 %    Psych/Spiritual Pre 25.21 %    Family Pre 13.75 %    GLOBAL Pre 19.58 %            Scores of 19 and below usually indicate a poorer quality of life  in these areas.  A difference of  2-3 points is a clinically meaningful difference.  A difference of 2-3 points in the total score of the Quality of Life Index has been associated with significant improvement in overall quality of life, self-image, physical symptoms, and general health in studies assessing change in quality of life.   PHQ-9: Recent Review Flowsheet Data     Depression screen [facility] 2/9 03/04/2021 10/23/2017 07/13/2017 05/23/2017 04/10/2014   Decreased Interest 0 0 0 0 0   Down, Depressed, Hopeless 2 0 1 0 0   PHQ - 2 Score 2 0 1 0 0   Altered sleeping 2 - 1 - -   Tired, decreased energy 3 - 0 - -   Change in appetite 2 - 0 - -   Feeling bad or failure about yourself  0 - 0 - -   Trouble concentrating 0 - 0 - -   Moving slowly or fidgety/restless 1 - 0 - -   Suicidal thoughts 0 - 0 - -   PHQ-9 Score 10 - 2 - -   Difficult doing work/chores Somewhat difficult - Not difficult at all - -      Interpretation of Total Score  Total Score Depression Severity:  1-4 = Minimal depression, 5-9 = Mild depression, 10-14 =  Moderate depression, 15-19 = Moderately severe depression, 20-27 = Severe depression   Psychosocial Evaluation and Intervention:  Psychosocial Evaluation - 03/04/21 1003       Psychosocial Evaluation & Interventions   Interventions Encouraged to exercise with the program and follow exercise prescription    Comments Pt has no barriers to participating in cardiac rehab. He has no identifiable psychosocial issues. He does have PTSD from his time in the [employer] during [place], but he is treated with Mirtazapine 30 mg once per day and also sees a psychiatrist through the [place]. He reports that his PTSD is under control and is not an issue at this time. He scored a 10 on his PHQ-9. He attributes most of this to his SOB that stems from his COPD. He has a lack of energy and activity intolerance that negatively impacts him. His goals while in the program are to maintain his weight and decrease his SOB with activity. He wants to get a lung reduction surgery to help with his COPD, but his pulmonologist wants him to get stronger and complete pulmonary rehab before they will consider him for the surgery. He is hopeful that the program will get him stronger and that he will be able to get the surgery. He is eager to start the program.    Expected Outcomes Pt will continue to not have any identfiable psychosocial issues.    Continue Psychosocial Services  No Follow up required             Psychosocial Re-Evaluation:  Psychosocial Re-Evaluation     Row Name 03/25/21 1341             Psychosocial Re-Evaluation   Comments Patient is new to program completing 3 sessions.  His initial QOL score is 19.58 and his PHQ-9 score is10.  He seems to enjoy coming to program and interacts well with class and staff.  He was referred to pulmonary rehab via Dr. Maryellen Pile with COPD.   We will continue to monitor and  he will continue to have no psychosocial issuses.       Expected Outcomes Patient will have no psychosocial  barriers identified at discharge.  Interventions Stress management education;Relaxation education;Encouraged to attend Pulmonary Rehabilitation for the exercise       Continue Psychosocial Services  No Follow up required                Psychosocial Discharge (Final Psychosocial Re-Evaluation):  Psychosocial Re-Evaluation - 03/25/21 1341       Psychosocial Re-Evaluation   Comments Patient is new to program completing 3 sessions.  His initial QOL score is 19.58 and his PHQ-9 score is10.  He seems to enjoy coming to program and interacts well with class and staff.  He was referred to pulmonary rehab via Dr. Maryellen Pile with COPD.   We will continue to monitor and  he will continue to have no psychosocial issuses.    Expected Outcomes Patient will have no psychosocial barriers identified at discharge.    Interventions Stress management education;Relaxation education;Encouraged to attend Pulmonary Rehabilitation for the exercise    Continue Psychosocial Services  No Follow up required              Education: Education Goals: Education classes will be provided on a weekly basis, covering required topics. Participant will state understanding/return demonstration of topics presented.  Learning Barriers/Preferences:  Learning Barriers/Preferences - 03/04/21 0830       Learning Barriers/Preferences   Learning Barriers None    Learning Preferences Written Material             Education Topics: How Lungs Work and Diseases: - Discuss the anatomy of the lungs and diseases that can affect the lungs, such as COPD.   Exercise: -Discuss the importance of exercise, FITT principles of exercise, normal and abnormal responses to exercise, and how to exercise safely.   Environmental Irritants: -Discuss types of environmental irritants and how to limit exposure to environmental irritants.   Meds/Inhalers and oxygen: - Discuss respiratory medications, definition of an inhaler and  oxygen, and the proper way to use an inhaler and oxygen.   Energy Saving Techniques: - Discuss methods to conserve energy and decrease shortness of breath when performing activities of daily living.    Bronchial Hygiene / Breathing Techniques: - Discuss breathing mechanics, pursed-lip breathing technique,  proper posture, effective ways to clear airways, and other functional breathing techniques   Cleaning Equipment: - Provides group verbal and written instruction about the health risks of elevated stress, cause of high stress, and healthy ways to reduce stress.   Nutrition I: Fats: - Discuss the types of cholesterol, what cholesterol does to the body, and how cholesterol levels can be controlled.   Nutrition II: Labels: -Discuss the different components of food labels and how to read food labels.   Respiratory Infections: - Discuss the signs and symptoms of respiratory infections, ways to prevent respiratory infections, and the importance of seeking medical treatment when having a respiratory infection. Flowsheet Row PULMONARY REHAB CHRONIC OBSTRUCTIVE PULMONARY DISEASE from 03/25/2021 in [facility]  Date 03/25/21  Educator DF  Instruction Review Code 2- Demonstrated Understanding       Stress I: Signs and Symptoms: - Discuss the causes of stress, how stress may lead to anxiety and depression, and ways to limit stress.   Stress II: Relaxation: -Discuss relaxation techniques to limit stress.   Oxygen for Home/Travel: - Discuss how to prepare for travel when on oxygen and proper ways to transport and store oxygen to ensure safety.   Knowledge Questionnaire Score:  Knowledge Questionnaire Score - 03/04/21 0830       Knowledge Questionnaire Score  Pre Score 14/18             Core Components/Risk Factors/Patient Goals at Admission:  Personal Goals and Risk Factors at Admission - 03/04/21 0834       Core Components/Risk Factors/Patient  Goals on Admission    Weight Management Yes;Weight Maintenance    Intervention Weight Management: Develop a combined nutrition and exercise program designed to reach desired caloric intake, while maintaining appropriate intake of nutrient and fiber, sodium and fats, and appropriate energy expenditure required for the weight goal.;Weight Management: Provide education and appropriate resources to help participant work on and attain dietary goals.    Expected Outcomes Weight Maintenance: Understanding of the daily nutrition guidelines, which includes 25-35% calories from fat, 7% or less cal from saturated fats, less than 200mg  cholesterol, less than 1.5gm of sodium, & 5 or more servings of fruits and vegetables daily    Improve shortness of breath with ADL's Yes    Intervention Provide education, individualized exercise plan and daily activity instruction to help decrease symptoms of SOB with activities of daily living.    Expected Outcomes Short Term: Improve cardiorespiratory fitness to achieve a reduction of symptoms when performing ADLs;Long Term: Be able to perform more ADLs without symptoms or delay the onset of symptoms    Personal Goal Other Yes    Personal Goal Be fit enough to be a candidate for lung reduction surgery.    Intervention Attend rehab twice per week and begin home exercise.    Expected Outcomes The patient will be deemed healthy enough by a surgeon to perform a lung reduction surgery.             Core Components/Risk Factors/Patient Goals Review:   Goals and Risk Factor Review     Row Name 03/25/21 1403             Core Components/Risk Factors/Patient Goals Review   Personal Goals Review Improve shortness of breath with ADL's;Increase knowledge of respiratory medications and ability to use respiratory devices properly.;Develop more efficient breathing techniques such as purse lipped breathing and diaphragmatic breathing and practicing self-pacing with activity.        Review Patient is new to program attending 3 sessions. He is referred to PR by Dr. 14/01/22 with COPD .  His personal goals for the program are to get stronger, improve SOB with activity.  We will continue to monitor his progress as he works toward meeting these goals.  O2 Sat during exercise averages at 100% with O2  in progress at 6L via nasal cannul.  We will encouage the decrease in liters of oxygen while he works toward meeting his goals.       Expected Outcomes Patient will complete the PR meeting both program and personal goals.                Core Components/Risk Factors/Patient Goals at Discharge (Final Review):   Goals and Risk Factor Review - 03/25/21 1403       Core Components/Risk Factors/Patient Goals Review   Personal Goals Review Improve shortness of breath with ADL's;Increase knowledge of respiratory medications and ability to use respiratory devices properly.;Develop more efficient breathing techniques such as purse lipped breathing and diaphragmatic breathing and practicing self-pacing with activity.    Review Patient is new to program attending 3 sessions. He is referred to PR by Dr. 14/01/22 with COPD .  His personal goals for the program are to get stronger, improve SOB with activity.  We will continue  to monitor his progress as he works toward meeting these goals.  O2 Sat during exercise averages at 100% with O2  in progress at 6L via nasal cannul.  We will encouage the decrease in liters of oxygen while he works toward meeting his goals.    Expected Outcomes Patient will complete the PR meeting both program and personal goals.             ITP Comments:   Comments: ITP REVIEW Pt is making expected progress toward pulmonary rehab goals after completing 4 sessions. Recommend continued exercise, life style modification, education, and utilization of breathing techniques to increase stamina and strength and decrease shortness of breath with exertion.

## 2021-04-01 ENCOUNTER — Encounter (HOSPITAL_COMMUNITY): Payer: No Typology Code available for payment source

## 2021-04-01 ENCOUNTER — Other Ambulatory Visit: Payer: Self-pay | Admitting: Allergy & Immunology

## 2021-04-06 ENCOUNTER — Encounter (HOSPITAL_COMMUNITY)
Admission: RE | Admit: 2021-04-06 | Discharge: 2021-04-06 | Disposition: A | Discharge status: Home / Self Care | Payer: No Typology Code available for payment source | Source: Ambulatory Visit | Attending: Pathology | Admitting: Pathology

## 2021-04-06 DIAGNOSIS — J449 Chronic obstructive pulmonary disease, unspecified: Secondary | ICD-10-CM | POA: Diagnosis not present

## 2021-04-06 NOTE — Progress Notes (Signed)
Daily Session Note  Patient Details  Name: Gerald Williams MRN: [id number] Date of Birth: Nov 15, 1950 Referring Provider:   Flowsheet Row PULMONARY REHAB COPD ORIENTATION from 03/04/2021 in [place]  Referring Provider Dr. Spero Curb       Encounter Date: 04/06/2021  Check In:  Session Check In - 04/06/21 1045       Check-In   Supervising physician immediately available to respond to emergencies [facility] MD immediately available    Physician(s) Dr. Domenic Polite    Location [facility]    Staff Present Hoy Register, MS, ACSM-CEP, Exercise Physiologist;Heather Zigmund Daniel, Exercise Physiologist    Virtual Visit No    Medication changes reported     No    Fall or balance concerns reported    Yes    Comments He has fallen once this year due to his right leg "giving out". He also has diabetic neuropathy that alters his balance as well.    Tobacco Cessation No Change    Warm-up and Cool-down Performed as group-led instruction    Resistance Training Performed Yes    VAD Patient? No    PAD/SET Patient? No      Pain Assessment   Currently in Pain? No/denies    Multiple Pain Sites No             Capillary Blood Glucose: No results found for this or any previous visit (from the past 24 hour(s)).    Social History   Tobacco Use  Smoking Status Former   Packs/day: 1.50   Years: 50.00   Pack years: 75.00   Types: Cigarettes   Start date: 04/26/1963   Quit date: 09/22/2013   Years since quitting: 7.5  Smokeless Tobacco Never  Tobacco Comments   patient using the patch, losenges, and medication    Goals Met:  Independence with exercise equipment Exercise tolerated well No report of concerns or symptoms today Strength training completed today  Goals Unmet:  Not Applicable  Comments: checkout time is 1145   Dr. Kathie Dike is Medical Director for [facility].

## 2021-04-07 ENCOUNTER — Encounter: Payer: Self-pay | Admitting: Gastroenterology

## 2021-04-08 ENCOUNTER — Encounter (HOSPITAL_COMMUNITY): Payer: No Typology Code available for payment source

## 2021-04-09 ENCOUNTER — Ambulatory Visit: Payer: Non-veteran care | Admitting: Internal Medicine

## 2021-04-13 ENCOUNTER — Encounter (HOSPITAL_COMMUNITY): Payer: No Typology Code available for payment source

## 2021-04-15 ENCOUNTER — Encounter (HOSPITAL_COMMUNITY): Payer: No Typology Code available for payment source

## 2021-04-20 ENCOUNTER — Encounter (HOSPITAL_COMMUNITY): Payer: No Typology Code available for payment source

## 2021-04-20 NOTE — Progress Notes (Signed)
Discharge Progress Report  Patient Details  Name: Gerald Williams MRN: [id number] Date of Birth: July 22, 1950 Referring Provider:   Flowsheet Row PULMONARY REHAB COPD ORIENTATION from 03/04/2021 in [facility] CARDIAC REHABILITATION  Referring Provider Dr. Maryellen Pile        Number of Visits: 5  Reason for Discharge:  Early Exit:  Personal  Smoking History:  Social History   Tobacco Use  Smoking Status Former   Packs/day: 1.50   Years: 50.00   Pack years: 75.00   Types: Cigarettes   Start date: 04/26/1963   Quit date: 09/22/2013   Years since quitting: 7.5  Smokeless Tobacco Never  Tobacco Comments   patient using the patch, losenges, and medication    Diagnosis:  Chronic obstructive pulmonary disease, unspecified COPD type (HCC)  ADL UCSD:  Pulmonary Assessment Scores     Row Name 03/04/21 0823         ADL UCSD   ADL Phase Entry     SOB Score total 83       CAT Score   CAT Score 30       mMRC Score   mMRC Score 3              Initial Exercise Prescription:  Initial Exercise Prescription - 03/04/21 1000       Date of Initial Exercise RX and Referring Provider   Date 03/04/21    Referring Provider Dr. Maryellen Pile    Expected Discharge Date 07/15/21      Oxygen   Oxygen Continuous    Liters 4    Maintain Oxygen Saturation 88% or higher      NuStep   Level 1    SPM 80    Minutes 22      Arm Ergometer   Level 1    RPM 50    Minutes 17      Prescription Details   Frequency (times per week) 2    Duration Progress to 30 minutes of continuous aerobic without signs/symptoms of physical distress      Intensity   THRR 40-80% of Max Heartrate 60-121    Ratings of Perceived Exertion 11-13    Perceived Dyspnea 0-4      Resistance Training   Training Prescription Yes    Weight 3 lbs    Reps 10-15             Discharge Exercise Prescription (Final Exercise Prescription Changes):  Exercise Prescription Changes - 04/06/21 1203        Response to Exercise   Blood Pressure (Admit) 120/70    Blood Pressure (Exercise) 165/84    Blood Pressure (Exit) 125/78    Heart Rate (Admit) 97 bpm    Heart Rate (Exercise) 102 bpm    Heart Rate (Exit) 89 bpm    Oxygen Saturation (Admit) 100 %    Oxygen Saturation (Exercise) 98 %    Oxygen Saturation (Exit) 100 %    Rating of Perceived Exertion (Exercise) 13    Perceived Dyspnea (Exercise) 13    Duration Continue with 30 min of aerobic exercise without signs/symptoms of physical distress.    Intensity THRR unchanged      Progression   Progression Continue to progress workloads to maintain intensity without signs/symptoms of physical distress.      Resistance Training   Training Prescription Yes    Weight 2 lbs    Reps 10-15    Time 10 Minutes      Oxygen   Oxygen  Continuous    Liters 5      NuStep   Level 1    SPM 82    Minutes 17    METs 1.8      Arm Ergometer   Level 1    RPM 43    Minutes 22    METs 1.7             Functional Capacity:  6 Minute Walk     Row Name 03/04/21 0957         6 Minute Walk   Phase Initial     Distance 800 feet     Walk Time 6 minutes     # of Rest Breaks 3     MPH 1.52     METS 1.91     RPE 17     Perceived Dyspnea  17     VO2 Peak 6.67     Symptoms Yes (comment)     Comments 3 standing rest breaks due to SOB for 20 sec, 40 sec, and 20 sec     Resting HR 78 bpm     Resting BP 110/76     Resting Oxygen Saturation  99 %     Exercise Oxygen Saturation  during 6 min walk 93 %     Max Ex. HR 98 bpm     Max Ex. BP 120/78     2 Minute Post BP 100/68       Interval HR   1 Minute HR 91     2 Minute HR 97     3 Minute HR 98     4 Minute HR 93     5 Minute HR 95     6 Minute HR 96     2 Minute Post HR 84     Interval Heart Rate? Yes       Interval Oxygen   Interval Oxygen? Yes     Baseline Oxygen Saturation % 99 %     1 Minute Oxygen Saturation % 98 %     1 Minute Liters of Oxygen 4 L     2 Minute Oxygen  Saturation % 95 %     2 Minute Liters of Oxygen 4 L     3 Minute Oxygen Saturation % 93 %     3 Minute Liters of Oxygen 4 L     4 Minute Oxygen Saturation % 96 %     4 Minute Liters of Oxygen 4 L     5 Minute Oxygen Saturation % 95 %     5 Minute Liters of Oxygen 4 L     6 Minute Oxygen Saturation % 95 %     6 Minute Liters of Oxygen 4 L     2 Minute Post Oxygen Saturation % 99 %     2 Minute Post Liters of Oxygen 4 L              Psychological, QOL, Others - Outcomes: PHQ 2/9: Depression screen [facility] 2/9 03/04/2021 10/23/2017 07/13/2017 05/23/2017 04/10/2014  Decreased Interest 0 0 0 0 0  Williams, Depressed, Hopeless 2 0 1 0 0  PHQ - 2 Score 2 0 1 0 0  Altered sleeping 2 - 1 - -  Tired, decreased energy 3 - 0 - -  Change in appetite 2 - 0 - -  Feeling bad or failure about yourself  0 - 0 - -  Trouble concentrating 0 - 0 - -  Moving slowly or fidgety/restless 1 - 0 - -  Suicidal thoughts 0 - 0 - -  PHQ-9 Score 10 - 2 - -  Difficult doing work/chores Somewhat difficult - Not difficult at all - -    Quality of Life:  Quality of Life - 03/04/21 1008       Quality of Life   Select Quality of Life      Quality of Life Scores   Health/Function Pre 18.83 %    Socioeconomic Pre 18.38 %    Psych/Spiritual Pre 25.21 %    Family Pre 13.75 %    GLOBAL Pre 19.58 %             Personal Goals: Goals established at orientation with interventions provided to work toward goal.  Personal Goals and Risk Factors at Admission - 03/04/21 0834       Core Components/Risk Factors/Patient Goals on Admission    Weight Management Yes;Weight Maintenance    Intervention Weight Management: Develop a combined nutrition and exercise program designed to reach desired caloric intake, while maintaining appropriate intake of nutrient and fiber, sodium and fats, and appropriate energy expenditure required for the weight goal.;Weight Management: Provide education and appropriate resources to help  participant work on and attain dietary goals.    Expected Outcomes Weight Maintenance: Understanding of the daily nutrition guidelines, which includes 25-35% calories from fat, 7% or less cal from saturated fats, less than 200mg  cholesterol, less than 1.5gm of sodium, & 5 or more servings of fruits and vegetables daily    Improve shortness of breath with ADL's Yes    Intervention Provide education, individualized exercise plan and daily activity instruction to help decrease symptoms of SOB with activities of daily living.    Expected Outcomes Short Term: Improve cardiorespiratory fitness to achieve a reduction of symptoms when performing ADLs;Long Term: Be able to perform more ADLs without symptoms or delay the onset of symptoms    Personal Goal Other Yes    Personal Goal Be fit enough to be a candidate for lung reduction surgery.    Intervention Attend rehab twice per week and begin home exercise.    Expected Outcomes The patient will be deemed healthy enough by a surgeon to perform a lung reduction surgery.              Personal Goals Discharge:  Goals and Risk Factor Review     Row Name 03/25/21 1403             Core Components/Risk Factors/Patient Goals Review   Personal Goals Review Improve shortness of breath with ADL's;Increase knowledge of respiratory medications and ability to use respiratory devices properly.;Develop more efficient breathing techniques such as purse lipped breathing and diaphragmatic breathing and practicing self-pacing with activity.       Review Patient is new to program attending 3 sessions. He is referred to PR by Dr. 14/01/22 with COPD .  His personal goals for the program are to get stronger, improve SOB with activity.  We will continue to monitor his progress as he works toward meeting these goals.  O2 Sat during exercise averages at 100% with O2  in progress at 6L via nasal cannul.  We will encouage the decrease in liters of oxygen while he works toward  meeting his goals.       Expected Outcomes Patient will complete the PR meeting both program and personal goals.  Exercise Goals and Review:  Exercise Goals     Row Name 03/04/21 1002 03/30/21 1533           Exercise Goals   Increase Physical Activity Yes Yes      Intervention Provide advice, education, support and counseling about physical activity/exercise needs.;Develop an individualized exercise prescription for aerobic and resistive training based on initial evaluation findings, risk stratification, comorbidities and participant's personal goals. Provide advice, education, support and counseling about physical activity/exercise needs.;Develop an individualized exercise prescription for aerobic and resistive training based on initial evaluation findings, risk stratification, comorbidities and participant's personal goals.      Expected Outcomes Short Term: Attend rehab on a regular basis to increase amount of physical activity.;Long Term: Exercising regularly at least 3-5 days a week.;Long Term: Add in home exercise to make exercise part of routine and to increase amount of physical activity. Short Term: Attend rehab on a regular basis to increase amount of physical activity.;Long Term: Exercising regularly at least 3-5 days a week.;Long Term: Add in home exercise to make exercise part of routine and to increase amount of physical activity.      Increase Strength and Stamina Yes Yes      Intervention Provide advice, education, support and counseling about physical activity/exercise needs.;Develop an individualized exercise prescription for aerobic and resistive training based on initial evaluation findings, risk stratification, comorbidities and participant's personal goals. Provide advice, education, support and counseling about physical activity/exercise needs.;Develop an individualized exercise prescription for aerobic and resistive training based on initial evaluation  findings, risk stratification, comorbidities and participant's personal goals.      Expected Outcomes Short Term: Increase workloads from initial exercise prescription for resistance, speed, and METs.;Short Term: Perform resistance training exercises routinely during rehab and add in resistance training at home;Long Term: Improve cardiorespiratory fitness, muscular endurance and strength as measured by increased METs and functional capacity ( ) Short Term: Increase workloads from initial exercise prescription for resistance, speed, and METs.;Short Term: Perform resistance training exercises routinely during rehab and add in resistance training at home;Long Term: Improve cardiorespiratory fitness, muscular endurance and strength as measured by increased METs and functional capacity ( )      Able to understand and use rate of perceived exertion (RPE) scale Yes Yes      Intervention Provide education and explanation on how to use RPE scale Provide education and explanation on how to use RPE scale      Expected Outcomes Short Term: Able to use RPE daily in rehab to express subjective intensity level;Long Term:  Able to use RPE to guide intensity level when exercising independently Short Term: Able to use RPE daily in rehab to express subjective intensity level;Long Term:  Able to use RPE to guide intensity level when exercising independently      Able to understand and use Dyspnea scale Yes Yes      Intervention Provide education and explanation on how to use Dyspnea scale Provide education and explanation on how to use Dyspnea scale      Expected Outcomes Short Term: Able to use Dyspnea scale daily in rehab to express subjective sense of shortness of breath during exertion;Long Term: Able to use Dyspnea scale to guide intensity level when exercising independently Short Term: Able to use Dyspnea scale daily in rehab to express subjective sense of shortness of breath during exertion;Long Term: Able to use  Dyspnea scale to guide intensity level when exercising independently      Knowledge and understanding of Target Heart  Rate Range (THRR) Yes Yes      Intervention Provide education and explanation of THRR including how the numbers were predicted and where they are located for reference Provide education and explanation of THRR including how the numbers were predicted and where they are located for reference      Expected Outcomes Short Term: Able to state/look up THRR;Long Term: Able to use THRR to govern intensity when exercising independently;Short Term: Able to use daily as guideline for intensity in rehab Short Term: Able to state/look up THRR;Long Term: Able to use THRR to govern intensity when exercising independently;Short Term: Able to use daily as guideline for intensity in rehab      Understanding of Exercise Prescription Yes Yes      Intervention Provide education, explanation, and written materials on patient's individual exercise prescription Provide education, explanation, and written materials on patient's individual exercise prescription      Expected Outcomes Short Term: Able to explain program exercise prescription;Long Term: Able to explain home exercise prescription to exercise independently Short Term: Able to explain program exercise prescription;Long Term: Able to explain home exercise prescription to exercise independently               Exercise Goals Re-Evaluation:  Exercise Goals Re-Evaluation     Row Name 03/30/21 1534             Exercise Goal Re-Evaluation   Exercise Goals Review Increase Physical Activity;Increase Strength and Stamina;Able to understand and use rate of perceived exertion (RPE) scale;Able to understand and use Dyspnea scale;Able to check pulse independently;Understanding of Exercise Prescription       Comments Pt has completed 3 sessions of pulmonary rehab. Pt is motivated but deconditioned so progression will be slow. Pt keeps increasing O2  during class from 4L to 6L. He is currently exercising at 1.7 METs on the stepper. Will continue to montior and progress as able.       Expected Outcomes Through exercise at rehab and at home, the patient will reach their stated goals.                Nutrition & Weight - Outcomes:  Pre Biometrics - 03/04/21 1002       Pre Biometrics   Height 5' 5.5" (1.664 m)    Weight 173 lb 8 oz (78.7 kg)    Waist Circumference 42.5 inches    Hip Circumference 39.5 inches    Waist to Hip Ratio 1.08 %    BMI (Calculated) 28.42    Triceps Skinfold 22 mm    % Body Fat 30.9 %    Grip Strength 23.5 kg    Flexibility 0 in    Single Leg Stand 5.25 seconds              Nutrition:  Nutrition Therapy & Goals - 03/25/21 1358       Personal Nutrition Goals   Comments Will continue to provide heart healthy nutritional education through handouts.  Weight is at 78.6kg .  We provide two educational sessions and also assistance with RD referrals if patient is interested.      Intervention Plan   Intervention Nutrition handout(s) given to patient.    Expected Outcomes Short Term Goal: Understand basic principles of dietary content, such as calories, fat, sodium, cholesterol and nutrients.             Nutrition Discharge:  Nutrition Assessments - 03/04/21 0829       MEDFICTS Scores   Pre  Score 24             Education Questionnaire Score:  Knowledge Questionnaire Score - 03/04/21 0830       Knowledge Questionnaire Score   Pre Score 14/18             Pt discharged from pulmonary rehab after 5 sessions. Pt states that the cold weather makes it harder for him to breathe. He states that he would like to do pulmonary rehab again in the spring when the weather warms up. I instructed him to get another referral from the [place] when he is ready to start pulmonary rehab again.

## 2021-04-22 ENCOUNTER — Encounter (HOSPITAL_COMMUNITY): Payer: No Typology Code available for payment source

## 2021-04-27 ENCOUNTER — Encounter (HOSPITAL_COMMUNITY): Payer: Medicare Other

## 2021-04-29 ENCOUNTER — Encounter (HOSPITAL_COMMUNITY): Payer: Medicare Other

## 2021-04-30 ENCOUNTER — Other Ambulatory Visit: Payer: Self-pay

## 2021-04-30 ENCOUNTER — Encounter: Payer: Self-pay | Admitting: Internal Medicine

## 2021-04-30 ENCOUNTER — Ambulatory Visit (INDEPENDENT_AMBULATORY_CARE_PROVIDER_SITE_OTHER): Payer: No Typology Code available for payment source | Admitting: Internal Medicine

## 2021-04-30 VITALS — BP 140/82 | HR 73 | Temp 98.3°F | Ht 65.5 in | Wt 174.0 lb

## 2021-04-30 DIAGNOSIS — J449 Chronic obstructive pulmonary disease, unspecified: Secondary | ICD-10-CM | POA: Diagnosis not present

## 2021-04-30 DIAGNOSIS — R911 Solitary pulmonary nodule: Secondary | ICD-10-CM | POA: Insufficient documentation

## 2021-04-30 DIAGNOSIS — J9611 Chronic respiratory failure with hypoxia: Secondary | ICD-10-CM | POA: Diagnosis not present

## 2021-04-30 NOTE — Assessment & Plan Note (Addendum)
Quit smoking 2015  - Spirometry 11/19/2018  FEV1 1.00 (36%)  Ratio 0.49  With no response  To saba on ? Prior to test - 03/11/2019  After extensive coaching inhaler device,  effectiveness  < 25% (way too fast, way short Ti, double trigger)  -  50% after coaching  - 01/06/2021  After extensive coaching inhaler device,  effectiveness =    90% with [place] > try stiolto   - 04/30/2021  After extensive coaching inhaler device,  effectiveness =   50% (no breath hold)   Prefers combivent and neb likely because did not master respimat and mainly benefiting from the neb, not the combivent but he's reluctant to rechallenge so continue as is for now unless having exac's or decline in activity tol due to sob

## 2021-04-30 NOTE — Assessment & Plan Note (Signed)
As of  11/19/2018  = 3lpm 24/7   - 03/11/2019 Patient Saturations on Room Air at Rest = 100%  Room Air while Ambulating = 88% and on 3 Liters of pulsed oxygen while Ambulating = 92% so approved for POC  - 04/30/2021   Walked on 5lpm cont  x  3  lap(s) =  [id number]  ft  @ avg pace, stopped due to end of study, min sob  with lowest 02 sats 90%    Each maintenance medication was reviewed in detail including emphasizing most importantly the difference between maintenance and prns and under what circumstances the prns are to be triggered using an action plan format where appropriate.  Total time for H and P, chart review, counseling,  directly observing portions of ambulatory 02 saturation study/ and generating customized AVS unique to this office visit / same day charting > 30 min

## 2021-04-30 NOTE — Progress Notes (Signed)
Gerald Williams, male    DOB: 1950-12-03    MRN: [id number]   Brief patient profile:  70 yobm quit smoking 08/2013 (pt says 2010)  GOLD III/ group D symptoms  with  better on trelegy vs symbicort with less need for saba but concerned about cost of non-[facility] meds.      History of Present Illness  11/19/2018  Pulmonary/ 1st office eval/G[facility] on ACEi and ARB Chief Complaint  Patient presents with   Follow-up    Breathing is overall doing well. He is using his albuterol inhaler 2-3 x per day and has not used his neb.   Dyspnea:  Can walk a half a block on 3lpm but does not check sats walking Cough: none  Sleep: sleeps on cpap/ 3lpm  SABA use: while on trelegy not needing much albuterol  rec It would be preferable if you would take higher doses of diovan and stop lisinopril and the latter causes confusion regarding interpretation of symptoms that look like copd in many cause  The alternative to trelegy would be restarting symbicort using spiriva in combination free thru the [place]   03/11/2019  f/u ov/[facility] re:  GOLD III spirometry / 02 dep  Very poor hfa maint symb 160 2bid /[facility]  Chief Complaint  Patient presents with   Follow-up    Patient comes in today for increased shortness of breath with exertion. Patient is normally on 3L and has been turning it up to 4-5L when moving around because he is so short of breath. Patient wants to switch home health [facility] companies and wants a home concentrator.  Dyspnea:  hc parking into store = [employer] = can't walk 100 yards even at a slow pace at a flat grade s stopping due to sob   Cough: daytime minimal mucus, sporadic  Sleeping: fine 3 pillows and cpoap SABA use:   02: 3lpm 95% when check  - turns it up to 4  Lpmwalking with sats   95%  - sleeping cpap / 3lpm Rec Plan A = Automatic = Always=   Symbicort 160 Take 2 puffs first thing in am and then another 2 puffs about 12 hours later and spiriva each am  Plan B = Backup (to supplement plan A,  not to replace it) Only use your albuterol inhaler or combivent  as a rescue medication  Plan C = Crisis (instead of Plan B but only if Plan B stops working) - only use your albuterol nebulizer if you first try Plan B and it fails to help > ok to use the nebulizer up to every 4 hours but if start needing it regularly call for immediate appointment   Admit date: 10/29/2020 Discharge date: 11/02/2020  Discharge Diagnoses:  Active Problems:   Chest pain   Angina at rest [facility])   Acute systolic heart failure (HCC)   COPD    Chronic respiratory failure on 4-5 L home O2     History of present illness:  69/M with history of advanced COPD on 5 L home O2, hypertension, dyslipidemia presented to the ED with 2-day history of left-sided chest pain.   [facility] Course:    Atypical chest pain -Ruled out for ACS, chest pain has resolved -EKG nonischemic, high-sensitivity troponins are negative -Left heart cath with normal coronaries   Nonischemic cardiomyopathy Acute systolic CHF -ECHO noted EF of 35%, cath noted normal coronaries, filling pressures were high on cath, that evening 7/8 -received IV Lasix, then became  hypotensive -Cardiology following, gradually uptitrating meds due to hypotension on 7/8 -toprol resumed and started on low dose losartan -Clinically felt to be euvolemic and hence not felt to require standing dose of diuretics at the time of discharge -added low dose lasix 20mg  PRN -Follow-up with [facility]   Hypotension -Overnight following cath on 7/8 evening -I suspect this is secondary to medications, (got Lasix, prazosin, lisinopril and beta-blocker) and sedation.  BP improved now -Clinically do not suspect acute PE -Monitor clinically, antihypertensives held -Restarted Toprol and added low-dose losartan -Blood pressure stable now   AKI -due to hypotension, held lisinopril -Improving, creatinine 1.4   COPD/chronic respiratory failure On 5 L home O2 at  baseline -Quit smoking 10 years ago, stable, continue Combivent and Symb    11/20/2020  post hosp f/u ov/G[facility] Williams re: GOLD  3  maint on combivent thru [place] and 02 4-8 lpm  Chief Complaint  Patient presents with   Acute Visit    Patient reports that he had procedure for his heart and feels that he is having trouble catching his breath x 2 weeks.   Dyspnea:  2-3 aisles at [facility] and has to stop on 5 lpm with sats down to 70s now reported  Cough: none  Sleeping: bed is flat, sleeps on back with 2 pillows and [facility]ap [facility]  SABA use: combivent bid  02: 4-5 lpm  Covid status:   x 3 vax  Went back to [facility] for what he describes as new onset  L ant peuritic, localized cp that started 2 d p d/c when he had [facility]  7/8 with wedge of 28 >  advil may help some and assoc worse sob since left hosp> returned to  ER dx as "copd exacerbation with chest tightness"  says no better since rx and insists pain in brand new and well localized and only present when tries to breath deeply Rec Go to [facility] ER further eval of L sided chest pain when you take a deep breath> CTa No definite evidence of pulmonary embolus. Focal ill-defined opacity measuring 12 x 7 mm is noted in the periphery of the left upper lobe. While this may represent focal inflammation or atelectasis, malignancy cannot be excluded.      01/06/2021  f/u ov/Gerald Williams re: GOLD 3 copd  maint on symbicort 160 2bid / combivent   Chief Complaint  Patient presents with   Follow-up    Patient needs a refill of his combivent and has runny nose.    Dyspnea:  sev aisles at HT  5lpm  Cough: none Sleeping: cpap/ 6lpm concentrator 2 pillows flat bed SABA use: combivent 4 x daily  02: 5lplm - 6lpm  Covid status:   vax x 3  Cxr  correlated with area of pain L ant chest and rapidly resolve on abx  Rec Plan A = Automatic = Always=    Stiolto 2 puffs first thing in am  Plan B = Backup (to supplement plan A, not to replace it) Only use your albuterol inhaler as a rescue  medication  Plan C = Crisis (instead of Plan B but only if Plan B stops working) - only use your albuterol nebulizer if you first try Plan B   04/30/2021  f/u ov/Luticia Tadros re: GOLD 3 COPD  maint on combivent qid  at his request  No chief complaint on file.  Dyspnea:  walking at [employer]  Cough: none  Sleeping: flat bed 2 pillows / not using  cpap consistently  [facility]  SABA use: neb first thing in am and noon and bedtime no change on stiolto 02: 4-5 lpm cont  Covid status:   vax x 4    No obvious day to day or daytime variability or assoc excess/ purulent sputum or mucus plugs or hemoptysis or cp or chest tightness, subjective wheeze or overt sinus or hb symptoms.   Sleeping  without nocturnal  or early am exacerbation  of respiratory  c/o's or need for noct saba. Also denies any obvious fluctuation of symptoms with weather or environmental changes or other aggravating or alleviating factors except as outlined above   No unusual exposure hx or h/o childhood pna/ asthma or knowledge of premature birth.  Current Allergies, Complete Past Medical History, Past Surgical History, Family History, and Social History were reviewed in [employer] record.  ROS  The following are not active complaints unless bolded Hoarseness, sore throat, dysphagia, dental problems, itching, sneezing,  nasal congestion or discharge of excess mucus or purulent secretions, ear ache,   fever, chills, sweats, unintended wt loss or wt gain, classically pleuritic or exertional cp,  orthopnea pnd or arm/hand swelling  or leg swelling, presyncope, palpitations, abdominal pain, anorexia, nausea, vomiting, diarrhea  or change in bowel habits or change in bladder habits, change in stools or change in urine, dysuria, hematuria,  rash, arthralgias, visual complaints, headache when not using cpap, numbness, weakness or ataxia or problems with walking or coordination,  change in mood or  memory.         Current Meds  Medication Sig   acetaminophen (TYLENOL) 500 MG tablet Take 1,000 mg by mouth every 6 (six) hours as needed for mild pain, fever or headache.   albuterol (PROVENTIL) (2.5 MG/3ML) 0.083% nebulizer solution Take 2.5 mg by nebulization every 6 (six) hours as needed for wheezing or shortness of breath.   atorvastatin (LIPITOR) 80 MG tablet Take 80 mg by mouth daily.   diclofenac Sodium (VOLTAREN) 1 % GEL Apply 2 g topically 4 (four) times daily.   DULoxetine (CYMBALTA) 60 MG capsule TAKE ONE CAPSULE BY MOUTH TWO TIMES A DAY FOR POSTTRAUMATIC STRESS DISORDER AND DEPRESSION   fluticasone (FLONASE) 50 MCG/ACT nasal spray INSTILL 2 SPRAYS INTO EACH NOSTRIL EVERY DAY MAXIMUM 2 SPRAYS IN EACH NOSTRIL DAILY. FOR ALLERGIES   furosemide (LASIX) 20 MG tablet Take 1 tablet (20 mg total) by mouth daily.   glucose blood (COOL BLOOD GLUCOSE TEST STRIPS) test strip Used to test blood sugar x2 daily---diagnosis code r73.03--for one touch verio flex   guaiFENesin (MUCINEX) 600 MG 12 hr tablet Take 600 mg by mouth 2 (two) times daily as needed.   ipratropium (ATROVENT) 0.03 % nasal spray Place 1 spray into both nostrils every 8 (eight) hours as needed for rhinitis.   Ipratropium-Albuterol (COMBIVENT RESPIMAT) 20-100 MCG/ACT AERS respimat Inhale 1 puff into the lungs every 6 (six) hours.   Lancets MISC Used to test blood sugar x2 daily---diagnosis code r73.03--for one touch verio flex   losartan (COZAAR) 25 MG tablet Take 25 mg by mouth daily.   metoprolol succinate (TOPROL-XL) 50 MG 24 hr tablet Take 1 tablet (50 mg total) by mouth daily. Take with or immediately following a meal.   mirtazapine (REMERON) 30 MG tablet Take 30 mg by mouth at bedtime.   OXYGEN Inhale 4-5 L into the lungs continuous.   predniSONE (DELTASONE) 10 MG tablet Take 20 mg by mouth 2 (two) times daily  with a meal.   spironolactone (ALDACTONE) 25 MG tablet Take 1 tablet (25 mg total) by mouth daily.                     Objective:    Wts  04/30/2021         174  01/06/2021       178  11/20/2020       179   03/11/19 176 lb 9.6 oz (80.1 kg)  11/19/18 166 lb 9.6 oz (75.6 kg)  10/24/18 160 lb (72.6 kg)     Vital signs reviewed  04/30/2021  - Note at rest 02 sats  100% on 5lpm cont    General appearance:    slt hoarse pleasant amb bm nad   HEENT : pt wearing mask not removed for exam due to covid - 19 concerns.    NECK :  without JVD/Nodes/TM/ nl carotid upstrokes bilaterally   LUNGS: no acc muscle use,  Mild barrel  contour chest wall with bilateral  Distant bs s audible wheeze and  without cough on insp or exp maneuvers  and mild  Hyperresonant  to  percussion bilaterally     CV:  RRR  no s3 or murmur or increase in P2, and no edema   ABD:  soft and nontender with pos end  insp Hoover's  in the supine position. No bruits or organomegaly appreciated, bowel sounds nl  MS:   Nl gait/  ext warm without deformities, calf tenderness, cyanosis or clubbing No obvious joint restrictions   SKIN: warm and dry without lesions    NEURO:  alert, approp, nl sensorium with  no motor or cerebellar deficits apparent.        I personally reviewed images and agree with radiology impression as follows:   Chest CTa 11/20/20 Focal ill-defined opacity measuring 12 x 7 mm is noted in the periphery of the left upper lobe. While this may represent focal inflammation or atelectasis, malignancy cannot be excluded. Follow-up unenhanced CT scan in 3-4 weeks is recommended to ensure resolution or stability.        Assessment

## 2021-04-30 NOTE — Patient Instructions (Addendum)
Make sure you check your oxygen saturation  AT  your highest level of activity (not after you stop)   to be sure it stays over 90% and adjust  02 flow upward to maintain this level if needed but remember to turn it back to previous settings when you stop (to conserve your supply).   Work on inhaler technique:  relax and gently blow all the way out then take a nice smooth full deep breath back in, triggering the inhaler at same time you start breathing in. Blow out thru nose. Rinse and gargle with water when done.  If mouth or throat bother you at all,  try brushing teeth/gums/tongue with arm and hammer toothpaste/ make a slurry and gargle and spit out.        Please schedule a follow up visit in 12 months but call sooner if needed   Late add:  ct s contrast due @ 6 m  to f/u spn from 11/20/20 th

## 2021-04-30 NOTE — Assessment & Plan Note (Signed)
Quit smoking 2015 - Chest CTa 11/20/20 Focal ill-defined opacity measuring 12 x 7 mm is noted in the periphery of the left upper lobe. While this may represent focal inflammation or atelectasis, malignancy cannot be excluded. Follow-up unenhanced CT scan in 3-4 weeks is recommended to ensure resolution or stability. - 04/30/2021 Super D CT chest >>  Discussed in detail all the  indications, usual  risks and alternatives  relative to the benefits with patient who agrees to proceed with w/u as outlined.

## 2021-05-04 ENCOUNTER — Encounter (HOSPITAL_COMMUNITY): Payer: Medicare Other

## 2021-05-06 ENCOUNTER — Encounter (HOSPITAL_COMMUNITY): Payer: Medicare Other

## 2021-05-11 ENCOUNTER — Encounter (HOSPITAL_COMMUNITY): Payer: Medicare Other

## 2021-05-12 ENCOUNTER — Other Ambulatory Visit: Payer: Self-pay

## 2021-05-12 ENCOUNTER — Ambulatory Visit: Payer: Medicare Other | Admitting: Cardiovascular Disease

## 2021-05-12 ENCOUNTER — Encounter: Payer: Self-pay | Admitting: Cardiovascular Disease

## 2021-05-12 VITALS — BP 153/86 | HR 89 | Ht 65.5 in | Wt 178.2 lb

## 2021-05-12 DIAGNOSIS — I10 Essential (primary) hypertension: Secondary | ICD-10-CM | POA: Diagnosis not present

## 2021-05-12 DIAGNOSIS — I5022 Chronic systolic (congestive) heart failure: Secondary | ICD-10-CM

## 2021-05-12 DIAGNOSIS — I272 Pulmonary hypertension, unspecified: Secondary | ICD-10-CM | POA: Diagnosis not present

## 2021-05-12 MED ORDER — LOSARTAN POTASSIUM 50 MG PO TABS: 50.0000 mg | ORAL_TABLET | Freq: Every day | ORAL | 1 refills | Status: DC

## 2021-05-12 NOTE — Patient Instructions (Signed)
Medication Instructions:  START Losartan 50 mg daily   *If you need a refill on your cardiac medications before your next appointment, please call your pharmacy*   Testing/Procedures: Echocardiogram - Your physician has requested that you have an echocardiogram. Echocardiography is a painless test that uses sound waves to create images of your heart. It provides your doctor with information about the size and shape of your heart and how well your hearts chambers and valves are working. This procedure takes approximately one hour. There are no restrictions for this procedure. This will be performed at either our [facility] location - [address]  -or- [facility] location [facility] 2nd floor.    Follow-Up: At [facility], you and your health needs are our priority.  As part of our continuing mission to provide you with exceptional heart care, we have created designated Provider Care Teams.  These Care Teams include your primary Cardiologist (physician) and Advanced Practice Providers (APPs -  Physician Assistants and Nurse Practitioners) who all work together to provide you with the care you need, when you need it.  We recommend signing up for the patient portal called "MyChart".  Sign up information is provided on this After Visit Summary.  MyChart is used to connect with patients for Virtual Visits (Telemedicine).  Patients are able to view lab/test results, encounter notes, upcoming appointments, etc.  Non-urgent messages can be sent to your provider as well.   To learn more about what you can do with MyChart, go to [email or web address].    Your next appointment:   6 month(s)  The format for your next appointment:   In Person  Provider:   Azalee Course, PA-C     1}

## 2021-05-12 NOTE — Progress Notes (Signed)
Cardiology Office Note:   Date:  05/12/2021  NAME:  Gerald Williams    MRN: [id number] DOB:  1950-05-20   PCP:  [facility] [place]  Cardiologist:  Evalina Field, MD  Electrophysiologist:  None   Referring MD: [place]   Chief Complaint  Patient presents with   Follow-up   History of Present Illness:   Gerald Williams is a 71 y.o. male with a hx of systolic HF (non-ischemic), pHTN, COPD, HTN, HLD who presents for follow-up.  He reports he is doing well.  Denies any chest pain.  He does get short of breath but this is related to his COPD.  He is wearing oxygen.  He reports he walks around [place].  Can walk up to 2 to 3 hours.  Does take breaks but no major symptoms.  Blood pressure a bit high 153/86.  Values have not been controlled at home.  We discussed increasing his losartan which he is okay to do.  He cannot afford Entresto or [place].  We did try patient assistance but he reports he would like alternative agents.  Seems to be doing well on current medications.  We discussed repeating his echo.  He is okay to do this.  Overall doing quite well.  Problem List Systolic HF -EF [id number], non-ischemic -Normal LHC 10/30/2020 2. Pulmonary hypertension -PCWP 28 (Group 2) 3. COPD 4. HTN 5. HLD -T chol 244, HDL 79, LDL 147, TG 92  Past Medical History: Past Medical History:  Diagnosis Date   CHF (congestive heart failure) (HCC)    Emphysema    Erectile dysfunction    Hyperlipemia    Hypertension    Polysubstance abuse ([place])    stopped using crack/cocaine December 4th 20-14   PTSD (post-traumatic stress disorder)    Sleep apnea     Past Surgical History: Past Surgical History:  Procedure Laterality Date   CARDIAC CATHETERIZATION     CYSTOSCOPY WITH INSERTION OF UROLIFT N/A 08/03/2020   Procedure: CYSTOSCOPY WITH INSERTION OF UROLIFT;  Surgeon: Franchot Gallo, MD;  Location: [facility];  Service: Urology;  Laterality: N/A;  30 MINS   LEFT HEART CATHETERIZATION  WITH CORONARY ANGIOGRAM N/A 07/15/2014   Procedure: LEFT HEART CATHETERIZATION WITH CORONARY ANGIOGRAM;  Surgeon: Troy Sine, MD;  Location: [facility] CATH LAB;  Service: Cardiovascular;  Laterality: N/A;   RIGHT/LEFT HEART CATH AND CORONARY ANGIOGRAPHY N/A 10/30/2020   Procedure: RIGHT/LEFT HEART CATH AND CORONARY ANGIOGRAPHY;  Surgeon: Wellington Hampshire, MD;  Location: [facility] CV LAB;  Service: Cardiovascular;  Laterality: N/A;    Current Medications: Current Meds  Medication Sig   acetaminophen (TYLENOL) 500 MG tablet Take 1,000 mg by mouth every 6 (six) hours as needed for mild pain, fever or headache.   albuterol (PROVENTIL) (2.5 MG/3ML) 0.083% nebulizer solution Take 2.5 mg by nebulization every 6 (six) hours as needed for wheezing or shortness of breath.   atorvastatin (LIPITOR) 80 MG tablet Take 80 mg by mouth daily.   diclofenac Sodium (VOLTAREN) 1 % GEL Apply 2 g topically 4 (four) times daily.   DULoxetine (CYMBALTA) 60 MG capsule TAKE ONE CAPSULE BY MOUTH TWO TIMES A DAY FOR POSTTRAUMATIC STRESS DISORDER AND DEPRESSION   fluticasone (FLONASE) 50 MCG/ACT nasal spray INSTILL 2 SPRAYS INTO EACH NOSTRIL EVERY DAY MAXIMUM 2 SPRAYS IN EACH NOSTRIL DAILY. FOR ALLERGIES   furosemide (LASIX) 20 MG tablet Take 1 tablet (20 mg total) by mouth daily.   glucose blood (COOL BLOOD GLUCOSE  TEST STRIPS) test strip Used to test blood sugar x2 daily---diagnosis code r73.03--for one touch verio flex   guaiFENesin (MUCINEX) 600 MG 12 hr tablet Take 600 mg by mouth 2 (two) times daily as needed.   ipratropium (ATROVENT) 0.03 % nasal spray Place 1 spray into both nostrils every 8 (eight) hours as needed for rhinitis.   Ipratropium-Albuterol (COMBIVENT RESPIMAT) 20-100 MCG/ACT AERS respimat Inhale 1 puff into the lungs every 6 (six) hours.   Lancets MISC Used to test blood sugar x2 daily---diagnosis code r73.03--for one touch verio flex   losartan (COZAAR) 50 MG tablet Take 1 tablet (50 mg total) by mouth  daily.   metoprolol succinate (TOPROL-XL) 50 MG 24 hr tablet Take 1 tablet (50 mg total) by mouth daily. Take with or immediately following a meal.   mirtazapine (REMERON) 30 MG tablet Take 30 mg by mouth at bedtime.   OXYGEN Inhale 4-5 L into the lungs continuous.   predniSONE (DELTASONE) 10 MG tablet Take 20 mg by mouth 2 (two) times daily with a meal.   spironolactone (ALDACTONE) 25 MG tablet Take 1 tablet (25 mg total) by mouth daily.   [DISCONTINUED] losartan (COZAAR) 25 MG tablet Take 25 mg by mouth daily.     Allergies:    Tiotropium, Gabapentin, Nortriptyline, Budeson-glycopyrrol-formoterol, Olodaterol, Oxcarbazepine, and Tiotropium bromide-olodaterol   Social History: Social History   Socioeconomic History   Marital status: Legally Separated    Spouse name: Not on file   Number of children: 4   Years of education: Not on file   Highest education level: Not on file  Occupational History   Occupation: Biomedical scientist - retired   Occupation: Hospital doctor  Tobacco Use   Smoking status: Former    Packs/day: 1.50    Years: 50.00    Pack years: 75.00    Types: Cigarettes    Start date: 04/26/1963    Quit date: 09/22/2013    Years since quitting: 7.6   Smokeless tobacco: Never   Tobacco comments:    patient using the patch, losenges, and medication  Vaping Use   Vaping Use: Never used  Substance and Sexual Activity   Alcohol use: Not Currently    Comment: He was drinking 3-4 drinks nightly x 40 years, quit in 2014.     Drug use: No    Comment: history of crack use and drug tested weekly at the [place]   Sexual activity: Yes  Other Topics Concern   Not on file  Social History Narrative    Education: 14 years and 20 years of [employer]. Recently married.    Social Determinants of Health   Financial Resource Strain: Not on file  Food Insecurity: Not on file  Transportation Needs: Not on file  Physical Activity: Not on file  Stress: Not on file  Social Connections: Not on file      Family History: The patient's family history includes Alcohol abuse in his brother; Cancer - Other in his sister; Diabetes in his mother; Healthy in his daughter and son; Heart attack in his brother and father; Heart disease in his father.  ROS:   All other ROS reviewed and negative. Pertinent positives noted in the HPI.     EKGs/Labs/Other Studies Reviewed:   The following studies were personally reviewed by me today:  TTE 10/29/2020  1. Left ventricular ejection fraction, by estimation, is 35%. The left  ventricle has moderately decreased function. The left ventricle  demonstrates global hypokinesis. Left ventricular diastolic parameters are  consistent with Grade I diastolic  dysfunction (impaired relaxation).   2. Right ventricular systolic function is mildly reduced. The right  ventricular size is normal. Tricuspid regurgitation signal is inadequate  for assessing PA pressure.   3. The mitral valve is normal in structure. No evidence of mitral valve  regurgitation. No evidence of mitral stenosis.   4. The aortic valve was not well visualized. Aortic valve regurgitation  is not visualized. No aortic stenosis is present.   5. Aortic dilatation noted. There is mild dilatation of the aortic root,  measuring 37 mm.   6. The inferior vena cava is normal in size with greater than 50%  respiratory variability, suggesting right atrial pressure of 3 mmHg.   7. Technically difficult study with poor acoustic windows.   LHC/RHC 10/30/2020 There is mild to moderate left ventricular systolic dysfunction. The left ventricular ejection fraction is 45-50% by visual estimate. LV end diastolic pressure is moderately elevated.   1.  Normal coronary arteries. 2.  Mildly to moderately reduced LV systolic function with an EF of 40% with global hypokinesis. 3.  Right heart catheterization showed moderately elevated filling pressures, moderate pulmonary hypertension and mildly reduced cardiac output.   Interpretation of right heart numbers was somewhat difficult due to severe respiratory variations and patient's difficulty with breath-hold.   Recommendations: The patient has nonischemic cardiomyopathy and he appears to be volume overloaded. Recommend medical therapy for heart failure and cardiomyopathy. I added furosemide 20 mg intravenously twice daily for volume overload. Consider switching lisinopril to Entresto in the near future and adding spironolactone. Discontinue prazosin if the only indication for use is hypertension.  Recent Labs: 10/29/2020: ALT 16 11/01/2020: TSH 0.396 11/20/2020: B Natriuretic Peptide 108.0; BUN 20; Creatinine, Ser 1.18; Hemoglobin 11.6; Platelets 217; Potassium 3.7; Sodium 143   Recent Lipid Panel    Component Value Date/Time   CHOL 244 (H) 10/29/2020 1250   TRIG 92 10/29/2020 1250   HDL 79 10/29/2020 1250   CHOLHDL 3.1 10/29/2020 1250   VLDL 18 10/29/2020 1250   LDLCALC 147 (H) 10/29/2020 1250    Physical Exam:   VS:  BP (!) 153/86    Pulse 89    Ht 5' 5.5" (1.664 m)    Wt 178 lb 3.2 oz (80.8 kg)    SpO2 98%    BMI 29.20 kg/m    Wt Readings from Last 3 Encounters:  05/12/21 178 lb 3.2 oz (80.8 kg)  04/30/21 174 lb (78.9 kg)  03/16/21 174 lb 2.6 oz (79 kg)    General: Well nourished, well developed, in no acute distress Head: Atraumatic, normal size  Eyes: PEERLA, EOMI  Neck: Supple, no JVD Endocrine: No thryomegaly Cardiac: Normal S1, S2; RRR; no murmurs, rubs, or gallops Lungs: Clear to auscultation bilaterally, no wheezing, rhonchi or rales  Abd: Soft, nontender, no hepatomegaly  Ext: No edema, pulses 2+ Musculoskeletal: No deformities, BUE and BLE strength normal and equal Skin: Warm and dry, no rashes   Neuro: Alert and oriented to person, place, time, and situation, CNII-XII grossly intact, no focal deficits  Psych: Normal mood and affect   ASSESSMENT:   Gerald Williams is a 71 y.o. male who presents for the following: 1. Chronic  systolic heart failure (HCC)   2. Pulmonary hypertension, unspecified ([place])   3. Primary hypertension     PLAN:   1. Chronic systolic heart failure ([place]) 2. Pulmonary hypertension, unspecified ([place]) -Diagnosed with systolic heart failure in July 2022.  Left heart catheterization  with no significant CAD.  This is a nonischemic cardiomyopathy. -Cannot afford Entresto or [place] despite patient assistance.  Currently on metoprolol succinate 50 mg daily.  Increase losartan to 50 mg daily.  Continue Aldactone 25 mg daily.  Most recent kidney profile is acceptable. -Euvolemic on exam.  Continue Lasix 20 mg daily. -EF 35%.  I would like to repeat an echocardiogram to see if his function has improved.  He would not qualify for ICD.  Suspect his EF has improved back to normal on GDMT.  3. Primary hypertension -BP not well controlled.  Increase losartan to 50 mg daily.  He will see me back in 6 months.     Disposition: Return in about 6 months (around 11/09/2021).  Medication Adjustments/Labs and Tests Ordered: Current medicines are reviewed at length with the patient today.  Concerns regarding medicines are outlined above.  Orders Placed This Encounter  Procedures   ECHOCARDIOGRAM COMPLETE   Meds ordered this encounter  Medications   losartan (COZAAR) 50 MG tablet    Sig: Take 1 tablet (50 mg total) by mouth daily.    Dispense:  90 tablet    Refill:  1    Patient Instructions  Medication Instructions:  START Losartan 50 mg daily   *If you need a refill on your cardiac medications before your next appointment, please call your pharmacy*   Testing/Procedures: Echocardiogram - Your physician has requested that you have an echocardiogram. Echocardiography is a painless test that uses sound waves to create images of your heart. It provides your doctor with information about the size and shape of your heart and how well your hearts chambers and valves are working. This procedure takes  approximately one hour. There are no restrictions for this procedure. This will be performed at either our [facility] location - [address], [place] location [facility] 2nd floor.    Follow-Up: At [facility], you and your health needs are our priority.  As part of our continuing mission to provide you with exceptional heart care, we have created designated Provider Care Teams.  These Care Teams include your primary Cardiologist (physician) and Advanced Practice Providers (APPs -  Physician Assistants and Nurse Practitioners) who all work together to provide you with the care you need, when you need it.  We recommend signing up for the patient portal called "MyChart".  Sign up information is provided on this After Visit Summary.  MyChart is used to connect with patients for Virtual Visits (Telemedicine).  Patients are able to view lab/test results, encounter notes, upcoming appointments, etc.  Non-urgent messages can be sent to your provider as well.   To learn more about what you can do with MyChart, go to [email or web address].    Your next appointment:   6 month(s)  The format for your next appointment:   In Person  Provider:   Almyra Deforest, PA-C     1}      Time Spent with Patient: I have spent a total of 35 minutes with patient reviewing hospital notes, telemetry, EKGs, labs and examining the patient as well as establishing an assessment and plan that was discussed with the patient.  > 50% of time was spent in direct patient care.  Signed, Addison Naegeli. Audie Box, MD, [place]  [address] [place], [place] [address] [phone]  05/12/2021 9:31 AM

## 2021-05-13 ENCOUNTER — Encounter: Payer: Self-pay | Admitting: Gastroenterology

## 2021-05-13 ENCOUNTER — Encounter (HOSPITAL_COMMUNITY): Payer: Medicare Other

## 2021-05-13 ENCOUNTER — Telehealth: Payer: Self-pay | Admitting: *Deleted

## 2021-05-13 NOTE — Telephone Encounter (Signed)
OV made °

## 2021-05-13 NOTE — Telephone Encounter (Signed)
Per chart patient's EF 35% and he uses home Oxygen per protocol patient will need an office visit. PV and [facility] colonoscopy cancelled.   Bonita Quin, will you please call and make this patient an OV. Thank you, Micheal Murad PV

## 2021-05-18 ENCOUNTER — Encounter (HOSPITAL_COMMUNITY): Payer: Medicare Other

## 2021-05-20 ENCOUNTER — Encounter (HOSPITAL_COMMUNITY): Payer: Medicare Other

## 2021-05-25 ENCOUNTER — Ambulatory Visit
Admission: RE | Admit: 2021-05-25 | Discharge: 2021-05-25 | Disposition: A | Discharge status: Home / Self Care | Payer: Medicare Other | Source: Ambulatory Visit | Attending: Internal Medicine | Admitting: Internal Medicine

## 2021-05-25 ENCOUNTER — Other Ambulatory Visit: Payer: Self-pay

## 2021-05-25 ENCOUNTER — Encounter (HOSPITAL_COMMUNITY): Payer: Medicare Other

## 2021-05-25 DIAGNOSIS — R911 Solitary pulmonary nodule: Secondary | ICD-10-CM

## 2021-05-27 ENCOUNTER — Encounter (HOSPITAL_COMMUNITY): Payer: Medicare Other

## 2021-05-27 ENCOUNTER — Ambulatory Visit (HOSPITAL_COMMUNITY): Payer: Medicare Other | Attending: Internal Medicine

## 2021-05-27 ENCOUNTER — Other Ambulatory Visit: Payer: Self-pay

## 2021-05-27 DIAGNOSIS — I5022 Chronic systolic (congestive) heart failure: Secondary | ICD-10-CM | POA: Diagnosis present

## 2021-05-27 LAB — ECHOCARDIOGRAM COMPLETE
Area-P 1/2: 4.54 cm2
S' Lateral: 3 cm

## 2021-05-31 ENCOUNTER — Other Ambulatory Visit: Payer: Self-pay | Admitting: Internal Medicine

## 2021-05-31 DIAGNOSIS — J449 Chronic obstructive pulmonary disease, unspecified: Secondary | ICD-10-CM

## 2021-05-31 NOTE — Progress Notes (Signed)
Spoke with pt and notified of results per Dr. Sherene Sires. Pt verbalized understanding and denied any questions. Pt agreeable to LDCT program and this was ordered.

## 2021-06-01 ENCOUNTER — Encounter (HOSPITAL_COMMUNITY): Payer: Medicare Other

## 2021-06-03 ENCOUNTER — Encounter (HOSPITAL_COMMUNITY): Payer: Medicare Other

## 2021-06-08 ENCOUNTER — Encounter (HOSPITAL_COMMUNITY): Payer: Medicare Other

## 2021-06-09 ENCOUNTER — Encounter: Payer: Non-veteran care | Admitting: Gastroenterology

## 2021-06-09 ENCOUNTER — Encounter: Payer: Self-pay | Admitting: Gastroenterology

## 2021-06-09 ENCOUNTER — Ambulatory Visit (INDEPENDENT_AMBULATORY_CARE_PROVIDER_SITE_OTHER): Payer: No Typology Code available for payment source | Admitting: Gastroenterology

## 2021-06-09 VITALS — BP 124/78 | HR 100 | Ht 65.5 in | Wt 174.4 lb

## 2021-06-09 DIAGNOSIS — Z86010 Personal history of colonic polyps: Secondary | ICD-10-CM

## 2021-06-09 DIAGNOSIS — J439 Emphysema, unspecified: Secondary | ICD-10-CM

## 2021-06-09 NOTE — Progress Notes (Signed)
HPI : Gerald Williams is a pleasant 71 year old male with a history of severe COPD, oxygen dependent and recent admission for nonischemic heart failure who is referred for colon cancer screening.  When he was admitted to [facility] in July 2022, he had an EF of 35%.  He underwent a left heart catheterization during that admission which showed no evidence of obstructive coronary artery disease.  He was treated on diuretics, and his EF has improved to greater than 55% based on recent cardiac MRI in December 2022.  Repeat echocardiogram earlier this month showed improvement in EF to 45 to 50%.  Patient reports continued ongoing shortness of breath secondary to his COPD.  For exercise, he walks in [place] up and down the [address].  He states that he gets short of breath going the full length of the longest I will in [place] which is about 120 steps.  He tells me he is going to start pulmonary rehab next month. The patient last underwent a colonoscopy in 2017 through [facility].  The colonoscopy report is not available for review at this time.  The patient knows that he has had polyps in the past, but does not know any details.  He does not think he has had any very large or concerning polyps.  He thinks all of his colonoscopies have been done at [facility].  He does not think any of his colonoscopies have been done at the [place].  He has no known family history of colon cancer. He denies any chronic GI symptoms.  He has regular bowel movements, denies constipation, diarrhea or hematochezia.  No problems with abdominal pain.  No upper GI symptoms such as heartburn, acid regurgitation, dysphagia, nausea/vomiting.    Past Medical History:  Diagnosis Date   CHF (congestive heart failure) (HCC)    Emphysema    Erectile dysfunction    Hyperlipemia    Hypertension    Polysubstance abuse (HCC)    stopped using crack/cocaine December 4th 20-14   PTSD (post-traumatic stress disorder)    Sleep apnea       Past Surgical History:  Procedure Laterality Date   CARDIAC CATHETERIZATION     CYSTOSCOPY WITH INSERTION OF UROLIFT N/A 08/03/2020   Procedure: CYSTOSCOPY WITH INSERTION OF UROLIFT;  Surgeon: Marcine Matar, MD;  Location: [facility];  Service: Urology;  Laterality: N/A;  30 MINS   LEFT HEART CATHETERIZATION WITH CORONARY ANGIOGRAM N/A 07/15/2014   Procedure: LEFT HEART CATHETERIZATION WITH CORONARY ANGIOGRAM;  Surgeon: Lennette Bihari, MD;  Location: [facility] CATH LAB;  Service: Cardiovascular;  Laterality: N/A;   RIGHT/LEFT HEART CATH AND CORONARY ANGIOGRAPHY N/A 10/30/2020   Procedure: RIGHT/LEFT HEART CATH AND CORONARY ANGIOGRAPHY;  Surgeon: Iran Ouch, MD;  Location: [facility] INVASIVE CV LAB;  Service: Cardiovascular;  Laterality: N/A;   Family History  Problem Relation Age of Onset   Diabetes Mother        Deceased   Heart disease Father        Deceased   Heart attack Father        x5   Cancer - Other Sister    Healthy Son    Healthy Daughter    Heart attack Brother        Deceased   Alcohol abuse Brother        Deceased   Social History   Tobacco Use   Smoking status: Former    Packs/day: 1.50    Years: 50.00  Pack years: 75.00    Types: Cigarettes    Start date: 04/26/1963    Quit date: 09/22/2013    Years since quitting: 7.7   Smokeless tobacco: Never   Tobacco comments:    patient using the patch, losenges, and medication  Vaping Use   Vaping Use: Never used  Substance Use Topics   Alcohol use: Not Currently    Comment: He was drinking 3-4 drinks nightly x 40 years, quit in 2014.     Drug use: No    Comment: history of crack use and drug tested weekly [facility]   Current Outpatient Medications  Medication Sig Dispense Refill   acetaminophen (TYLENOL) 500 MG tablet Take 1,000 mg by mouth every 6 (six) hours as needed for mild pain, fever or headache.     albuterol (PROVENTIL) (2.5 MG/3ML) 0.083% nebulizer solution Take 2.5 mg by nebulization every 6 (six)  hours as needed for wheezing or shortness of breath.     atorvastatin (LIPITOR) 80 MG tablet Take 80 mg by mouth daily.     diclofenac Sodium (VOLTAREN) 1 % GEL Apply 2 g topically 4 (four) times daily. 150 g 1   DULoxetine (CYMBALTA) 60 MG capsule TAKE ONE CAPSULE BY MOUTH TWO TIMES A DAY FOR POSTTRAUMATIC STRESS DISORDER AND DEPRESSION     fluticasone (FLONASE) 50 MCG/ACT nasal spray INSTILL 2 SPRAYS INTO EACH NOSTRIL EVERY DAY MAXIMUM 2 SPRAYS IN EACH NOSTRIL DAILY. FOR ALLERGIES     furosemide (LASIX) 20 MG tablet Take 1 tablet (20 mg total) by mouth daily. 90 tablet 1   glucose blood (COOL BLOOD GLUCOSE TEST STRIPS) test strip Used to test blood sugar x2 daily---diagnosis code r73.03--for one touch verio flex 200 each 3   guaiFENesin (MUCINEX) 600 MG 12 hr tablet Take 600 mg by mouth 2 (two) times daily as needed.     ipratropium (ATROVENT) 0.03 % nasal spray Place 1 spray into both nostrils every 8 (eight) hours as needed for rhinitis. 30 mL 5   Ipratropium-Albuterol (COMBIVENT RESPIMAT) 20-100 MCG/ACT AERS respimat Inhale 1 puff into the lungs every 6 (six) hours. 4 g 6   Lancets MISC Used to test blood sugar x2 daily---diagnosis code r73.03--for one touch verio flex 200 each 3   losartan (COZAAR) 50 MG tablet Take 1 tablet (50 mg total) by mouth daily. 90 tablet 1   metoprolol succinate (TOPROL-XL) 50 MG 24 hr tablet Take 1 tablet (50 mg total) by mouth daily. Take with or immediately following a meal. 90 tablet 1   mirtazapine (REMERON) 30 MG tablet Take 30 mg by mouth at bedtime.     OXYGEN Inhale 4-5 L into the lungs continuous.     potassium chloride SA (KLOR-CON) 10 MEQ tablet Take 1 tablet (10 mEq total) by mouth daily. 90 tablet 3   predniSONE (DELTASONE) 10 MG tablet Take 20 mg by mouth 2 (two) times daily with a meal.     spironolactone (ALDACTONE) 25 MG tablet Take 1 tablet (25 mg total) by mouth daily. 90 tablet 1   No current facility-administered medications for this visit.    Allergies  Allergen Reactions   Tiotropium Other (See Comments)    Other reaction(s): Cough, Dysphagia, Cough, Dysphagia   Gabapentin     Other reaction(s): Dizziness   Nortriptyline     Other reaction(s): Drowsy   Budeson-Glycopyrrol-Formoterol Other (See Comments)    Other reaction(s): Retention of urine, Constipation, Retention of urine, Constipation   Olodaterol Hives and  Rash   Oxcarbazepine Nausea And Vomiting   Tiotropium Bromide-Olodaterol Itching     Review of Systems: All systems reviewed and negative except where noted in HPI.    ECHOCARDIOGRAM COMPLETE  Result Date: 05/27/2021    ECHOCARDIOGRAM REPORT   Patient Name:   Gerald Williams Date of Exam: 05/27/2021 Medical Rec #:  [id number]      Height:       65.5 in Accession #:    [id number]     Weight:       178.2 lb Date of Birth:  11-06-1950      BSA:          1.894 m Patient Age:    59 years       BP:           143/88 mmHg Patient Gender: M              HR:           85 bpm. Exam Location:  [facility] Procedure: 2D Echo, 3D Echo, Cardiac Doppler and Color Doppler Indications:    I50.22 CHF  History:        Patient has prior history of Echocardiogram examinations, most                 recent 10/29/2020. Pulmonary HTN and COPD; Risk                 Factors:Hypertension and HLD.  Sonographer:    Marygrace Drought RCS Referring Phys: [id number] Clifford  1. Left ventricular ejection fraction, by estimation, is 45 to 50%. Left ventricular ejection fraction by 3D volume is 49 %. The left ventricle has mildly decreased function. The left ventricle has no regional wall motion abnormalities. Left ventricular  diastolic parameters are indeterminate.  2. Right ventricular systolic function is normal. The right ventricular size is normal. Tricuspid regurgitation signal is inadequate for assessing PA pressure.  3. The mitral valve is normal in structure. Trivial mitral valve regurgitation. No evidence of mitral stenosis.  4.  The aortic valve is normal in structure. Aortic valve regurgitation is not visualized. No aortic stenosis is present.  5. The inferior vena cava is normal in size with greater than 50% respiratory variability, suggesting right atrial pressure of 3 mmHg. FINDINGS  Left Ventricle: Left ventricular ejection fraction, by estimation, is 45 to 50%. Left ventricular ejection fraction by 3D volume is 49 %. The left ventricle has mildly decreased function. The left ventricle has no regional wall motion abnormalities. The  left ventricular internal cavity size was normal in size. There is no left ventricular hypertrophy. Left ventricular diastolic parameters are indeterminate. Normal left ventricular filling pressure. Right Ventricle: The right ventricular size is normal. No increase in right ventricular wall thickness. Right ventricular systolic function is normal. Tricuspid regurgitation signal is inadequate for assessing PA pressure. Left [facility]: Left atrial size was normal in size. Right [facility]: Right atrial size was normal in size. Pericardium: There is no evidence of pericardial effusion. Mitral Valve: The mitral valve is normal in structure. Trivial mitral valve regurgitation. No evidence of mitral valve stenosis. Tricuspid Valve: The tricuspid valve is normal in structure. Tricuspid valve regurgitation is not demonstrated. No evidence of tricuspid stenosis. Aortic Valve: The aortic valve is normal in structure. Aortic valve regurgitation is not visualized. No aortic stenosis is present. Pulmonic Valve: The pulmonic valve was normal in structure. Pulmonic valve regurgitation is trivial. No evidence of pulmonic stenosis. Aorta: The aortic  root is normal in size and structure. Venous: The inferior vena cava is normal in size with greater than 50% respiratory variability, suggesting right atrial pressure of 3 mmHg. IAS/Shunts: No atrial level shunt detected by color flow Doppler.  LEFT VENTRICLE PLAX 2D LVIDd:          3.90 cm         Diastology LVIDs:         3.00 cm         LV e' medial:    7.94 cm/s LV PW:         1.10 cm         LV E/e' medial:  7.7 LV IVS:        0.90 cm         LV e' lateral:   6.53 cm/s LVOT diam:     2.00 cm         LV E/e' lateral: 9.4 LV SV:         53 LV SV Index:   28 LVOT Area:     3.14 cm        3D Volume EF                                LV 3D EF:    Left                                             ventricul                                             ar                                             ejection                                             fraction                                             by 3D                                             volume is                                             49 %.                                 3D Volume EF:  3D EF:        49 %                                LV EDV:       77 ml                                LV ESV:       40 ml                                LV SV:        37 ml RIGHT VENTRICLE RV Basal diam:  2.80 cm RV S prime:     8.70 cm/s TAPSE (M-mode): 1.7 cm LEFT ATRIUM             Index        RIGHT ATRIUM           Index LA diam:        2.90 cm 1.53 cm/m   RA Area:     11.30 cm LA Vol (A2C):   44.2 ml 23.36 ml/m  RA Volume:   27.20 ml  14.36 ml/m LA Vol (A4C):   25.1 ml 13.22 ml/m LA Biplane Vol: 43.4 ml 22.91 ml/m  AORTIC VALVE LVOT Vmax:   90.20 cm/s LVOT Vmean:  59.400 cm/s LVOT VTI:    0.170 m  AORTA Ao Root diam: 3.30 cm Ao Asc diam:  3.60 cm MITRAL VALVE MV Area (PHT):             SHUNTS MV Decel Time:             Systemic VTI:  0.17 m MV E velocity: 61.30 cm/s  Systemic Diam: 2.00 cm MV A velocity: 65.50 cm/s MV E/A ratio:  0.94 Fransico Him MD Electronically signed by Fransico Him MD Signature Date/Time: 05/27/2021/9:57:48 AM    Final    CT Super D Chest Wo Contrast  Result Date: 05/26/2021 CLINICAL DATA:  71 year old male with history of pulmonary nodule. EXAM: CT CHEST WITHOUT CONTRAST TECHNIQUE:  Multidetector CT imaging of the chest was performed using thin slice collimation for electromagnetic bronchoscopy planning purposes, without intravenous contrast. RADIATION DOSE REDUCTION: This exam was performed according to the departmental dose-optimization program which includes automated exposure control, adjustment of the mA and/or kV according to patient size and/or use of iterative reconstruction technique. COMPARISON:  Chest CT 11/20/2020. FINDINGS: Cardiovascular: Heart size is normal. There is no significant pericardial fluid, thickening or pericardial calcification. There is aortic atherosclerosis, as well as atherosclerosis of the great vessels of the mediastinum and the coronary arteries, including calcified atherosclerotic plaque in the left anterior descending coronary artery. Mediastinum/Nodes: No pathologically enlarged mediastinal or hilar lymph nodes. Please note that accurate exclusion of hilar adenopathy is limited on noncontrast CT scans. Esophagus is unremarkable in appearance. No axillary lymphadenopathy. Lungs/Pleura: Previously noted ill-defined left upper lobe nodule has completely resolved compared to the prior study, presumably a benign infectious or inflammatory etiology on the prior examination. Tiny calcified granuloma in the periphery of the right upper lobe. 3 mm nodule in the posterior aspect of the right upper lobe (axial image 61 of series 5), stable. No other new suspicious appearing pulmonary nodules or masses are noted. No acute consolidative airspace disease. No pleural effusions.  Areas of linear scarring are noted in the lateral segment of the right middle lobe and posterior aspect of the left upper lobe. Diffuse bronchial wall thickening with moderate centrilobular and paraseptal emphysema. Upper Abdomen: Aortic atherosclerosis. Musculoskeletal: There are no aggressive appearing lytic or blastic lesions noted in the visualized portions of the skeleton. IMPRESSION: 1.  Resolution of previously noted left upper lobe pulmonary nodule. 2. 3 mm right upper lobe pulmonary nodule is stable compared to the prior study (previously partially obscured by motion), considered benign. 3. Aortic atherosclerosis, in addition to left anterior descending coronary artery disease. Please note that although the presence of coronary artery calcium documents the presence of coronary artery disease, the severity of this disease and any potential stenosis cannot be assessed on this non-gated CT examination. Assessment for potential risk factor modification, dietary therapy or pharmacologic therapy may be warranted, if clinically indicated. 4. Diffuse bronchial wall thickening with moderate centrilobular and paraseptal emphysema; imaging findings suggestive of underlying COPD. Aortic Atherosclerosis (ICD10-I70.0) and Emphysema (ICD10-J43.9). Electronically Signed   By: Vinnie Langton M.D.   On: 05/26/2021 06:09    Physical Exam: BP 124/78 (BP Location: Right Arm, Patient Position: Sitting)    Pulse 100    Ht 5' 5.5" (1.664 m)    Wt 174 lb 6.4 oz (79.1 kg)    SpO2 99%    BMI 28.58 kg/m  Constitutional: Pleasant,well-developed, African-American male in no acute distress.  Portable oxygen tank with nasal cannula HEENT: Normocephalic and atraumatic. Conjunctivae are normal. No scleral icterus. Neck supple.  Cardiovascular: Normal rate, regular rhythm.  Pulmonary/chest: Distant, decreased breath sounds bilaterally, no wheezing, rales or rhonchi. Abdominal: Soft, nondistended, nontender. Bowel sounds active throughout. There are no masses palpable. No hepatomegaly. Extremities: no edema Neurological: Alert and oriented to person place and time. Skin: Skin is warm and dry. No rashes noted. Psychiatric: Normal mood and affect. Behavior is normal.  CBC    Component Value Date/Time   WBC 5.8 11/20/2020 1015   RBC 3.87 (L) 11/20/2020 1015   HGB 11.6 (L) 11/20/2020 1015   HCT 35.5 (L)  11/20/2020 1015   PLT 217 11/20/2020 1015   MCV 91.7 11/20/2020 1015   MCH 30.0 11/20/2020 1015   MCHC 32.7 11/20/2020 1015   RDW 13.8 11/20/2020 1015   LYMPHSABS 1.9 11/10/2020 2045   MONOABS 0.4 11/10/2020 2045   EOSABS 0.1 11/10/2020 2045   BASOSABS 0.0 11/10/2020 2045    CMP     Component Value Date/Time   NA 143 11/20/2020 1015   K 3.7 11/20/2020 1015   CL 99 11/20/2020 1015   CO2 35 (H) 11/20/2020 1015   GLUCOSE 130 (H) 11/20/2020 1015   BUN 20 11/20/2020 1015   CREATININE 1.18 11/20/2020 1015   CALCIUM 9.3 11/20/2020 1015   PROT 7.3 10/29/2020 0506   ALBUMIN 3.7 10/29/2020 0506   AST 20 10/29/2020 0506   ALT 16 10/29/2020 0506   ALKPHOS 74 10/29/2020 0506   BILITOT 0.6 10/29/2020 0506   GFRNONAA >60 11/20/2020 1015   GFRAA >60 05/06/2019 1620     ASSESSMENT AND PLAN: 71 year old male with severe oxygen dependent COPD and nonischemic CHF referred for surveillance colonoscopy.  The patient's last colonoscopy was in 2017, however we have no records of what was found on the colonoscopy.  In addition, I have no records of what his previous colonoscopies have seen.  The patient is very high risk for perioperative complications given his severe pulmonary disease and recent heart failure.  We discussed how the decision to proceed with further surveillance colonoscopies is based on the relative risk of colon cancer versus the relative risk of complications based on his comorbidities.  Right now, I have limited information to inform me of his possible colon cancer risk given the absence of knowledge about his polyp history.  If he has not had any high risk polyps or numerous (greater than 10) adenomas on previous colonoscopies, I would say that his risk of colon cancer is low, and that the risk of a colonoscopy probably outweighs the benefits.  We will attempt to obtain all the information from his previous colonoscopies and path reports to make a better informed decision about the  risks and benefits of further polyp surveillance.   History of colon polyps -Get records of previous colonoscopies/pathology reports -If records indicate patient is very high risk for colon cancer, his colonoscopy would need to be done in the hospital due to his oxygen dependent COPD  Thatcher Doberstein E. Candis Schatz, MD [facility]   CC:  [facility]

## 2021-06-09 NOTE — Patient Instructions (Signed)
We will call once we have reviewed your previous colonoscopy.  If you are age 70 or older, your body mass index should be between 23-30. Your Body mass index is 28.58 kg/m. If this is out of the aforementioned range listed, please consider follow up with your Primary Care Provider. ________________________________________________________  The [facility] providers would like to encourage you to use [facility] to communicate with providers for non-urgent requests or questions.  Due to long hold times on the telephone, sending your provider a message by [facility] may be a faster and more efficient way to get a response.  Please allow 48 business hours for a response.  Please remember that this is for non-urgent requests.  _______________________________________________________

## 2021-06-10 ENCOUNTER — Encounter (HOSPITAL_COMMUNITY): Payer: Medicare Other

## 2021-06-15 ENCOUNTER — Encounter (HOSPITAL_COMMUNITY): Payer: Medicare Other

## 2021-06-17 ENCOUNTER — Encounter (HOSPITAL_COMMUNITY): Payer: Medicare Other

## 2021-06-22 ENCOUNTER — Encounter (HOSPITAL_COMMUNITY): Payer: Medicare Other

## 2021-06-24 ENCOUNTER — Encounter (HOSPITAL_COMMUNITY): Payer: Medicare Other

## 2021-06-29 ENCOUNTER — Encounter (HOSPITAL_COMMUNITY): Payer: Medicare Other

## 2021-07-01 ENCOUNTER — Encounter (HOSPITAL_COMMUNITY): Payer: Medicare Other

## 2021-07-05 ENCOUNTER — Encounter (HOSPITAL_COMMUNITY): Payer: Self-pay

## 2021-07-05 ENCOUNTER — Telehealth: Payer: Self-pay | Admitting: Internal Medicine

## 2021-07-05 ENCOUNTER — Encounter (HOSPITAL_COMMUNITY)
Admission: RE | Admit: 2021-07-05 | Discharge: 2021-07-05 | Disposition: A | Discharge status: Home / Self Care | Payer: No Typology Code available for payment source | Source: Ambulatory Visit | Attending: Pathology | Admitting: Pathology

## 2021-07-05 VITALS — BP 144/88 | HR 84 | Ht 66.5 in | Wt 176.8 lb

## 2021-07-05 DIAGNOSIS — J449 Chronic obstructive pulmonary disease, unspecified: Secondary | ICD-10-CM | POA: Insufficient documentation

## 2021-07-05 NOTE — Telephone Encounter (Signed)
Spoke with the pt  ?He wanted copy of med list- printed and up front for pick up  ? ?Pt also mentioned that he has been doing research on lung reduction surgery  ?He feels that he would benefit from this procedure and wants Dr Thurston Hole opinion on this  ?Please advise thanks! ?

## 2021-07-05 NOTE — Telephone Encounter (Signed)
I called and spoke with the pt and notified of response per MW. Pt verbalized understanding and nothing further needed.  ?

## 2021-07-05 NOTE — Telephone Encounter (Signed)
Very rarely done anymore, would need to fail every conventional rx and would need ov with med and all meds in hand before considering referral  ?

## 2021-07-05 NOTE — Progress Notes (Cosign Needed)
Pulmonary Individual Treatment Plan  Patient Details  Name: Gerald Williams MRN: [id number] Date of Birth: [birth year] Referring Provider:   Flowsheet Row PULMONARY REHAB COPD ORIENTATION from 07/05/2021 in [facility] CARDIAC REH[facility]ILITATION  Referring Provider Dr. Maryellen Pile       Initial Encounter Date:  Flowsheet Row PULMONARY REHAB COPD ORIENTATION from 07/05/2021 in [facility]  Date 07/05/21       Visit Diagnosis: Chronic obstructive pulmonary disease, unspecified COPD type (HCC)  Patient's Home Medications on Admission:   Current Outpatient Medications:    acetaminophen (TYLENOL) 500 MG tablet, Take 1,000 mg by mouth every 6 (six) hours as needed for mild pain, fever or headache., Disp: , Rfl:    albuterol (PROVENTIL) (2.5 MG/3ML) 0.083% nebulizer solution, Take 2.5 mg by nebulization every 6 (six) hours as needed for wheezing or shortness of breath., Disp: , Rfl:    atorvastatin (LIPITOR) 80 MG tablet, Take 80 mg by mouth daily., Disp: , Rfl:    diclofenac Sodium (VOLTAREN) 1 % GEL, Apply 2 g topically 4 (four) times daily., Disp: 150 g, Rfl: 1   DULoxetine (CYMBALTA) 60 MG capsule, TAKE ONE CAPSULE BY MOUTH TWO TIMES A DAY FOR POSTTRAUMATIC STRESS DISORDER AND DEPRESSION, Disp: , Rfl:    fluticasone (FLONASE) 50 MCG/ACT nasal spray, INSTILL 2 SPRAYS INTO EACH NOSTRIL EVERY DAY MAXIMUM 2 SPRAYS IN EACH NOSTRIL DAILY. FOR ALLERGIES, Disp: , Rfl:    furosemide (LASIX) 20 MG tablet, Take 1 tablet (20 mg total) by mouth daily., Disp: 90 tablet, Rfl: 1   glucose blood (COOL BLOOD GLUCOSE TEST STRIPS) test strip, Used to test blood sugar x2 daily---diagnosis code r73.03--for one touch verio flex, Disp: 200 each, Rfl: 3   guaiFENesin (MUCINEX) 600 MG 12 hr tablet, Take 600 mg by mouth 2 (two) times daily as needed., Disp: , Rfl:    ipratropium (ATROVENT) 0.03 % nasal spray, Place 1 spray into both nostrils every 8 (eight) hours as needed for rhinitis., Disp: 30 mL,  Rfl: 5   Ipratropium-Albuterol (COMBIVENT RESPIMAT) 20-100 MCG/ACT AERS respimat, Inhale 1 puff into the lungs every 6 (six) hours., Disp: 4 g, Rfl: 6   Lancets MISC, Used to test blood sugar x2 daily---diagnosis code r73.03--for one touch verio flex, Disp: 200 each, Rfl: 3   losartan (COZAAR) 50 MG tablet, Take 1 tablet (50 mg total) by mouth daily., Disp: 90 tablet, Rfl: 1   metoprolol succinate (TOPROL-XL) 50 MG 24 hr tablet, Take 1 tablet (50 mg total) by mouth daily. Take with or immediately following a meal., Disp: 90 tablet, Rfl: 1   mirtazapine (REMERON) 30 MG tablet, Take 30 mg by mouth at bedtime., Disp: , Rfl:    OXYGEN, Inhale 4-5 L into the lungs continuous., Disp: , Rfl:    SYMBICORT 160-4.5 MCG/ACT inhaler, Inhale 2 puffs into the lungs daily., Disp: , Rfl:    tamsulosin (FLOMAX) 0.4 MG CAPS capsule, Take 0.4 mg by mouth daily., Disp: , Rfl:   Past Medical History: Past Medical History:  Diagnosis Date   CHF (congestive heart failure) (HCC)    Emphysema    Erectile dysfunction    Hyperlipemia    Hypertension    Polysubstance abuse (HCC)    stopped using crack/cocaine December 4th 20-14   PTSD (post-traumatic stress disorder)    Sleep apnea     Tobacco Use: Social History   Tobacco Use  Smoking Status Former   Packs/day: 1.50   Years: 50.00   Pack years: 75.00  Types: Cigarettes   Start date: 04/26/1963   Quit date: 09/22/2013   Years since quitting: 7.7  Smokeless Tobacco Never  Tobacco Comments   patient using the patch, losenges, and medication    Labs: Recent Review Flowsheet Data     Labs for ITP Cardiac and Pulmonary Rehab Latest Ref Rng & Units 05/07/2019 10/29/2020 10/30/2020 10/30/2020 10/31/2020   Cholestrol 0 - 200 mg/dL 161(W) 960(A) - - -   LDLCALC 0 - 99 mg/dL 540(J) 811(B) - - -   HDL >40 mg/dL 59 79 - - -   Trlycerides <150 mg/dL 84 92 - - -   Hemoglobin A1c 4.8 - 5.6 % 6.1(H) - - - -   PHART 7.350 - 7.450 - - - - 7.371   PCO2ART 32.0 - 48.0 mmHg  - - - - 50.2(H)   HCO3 20.0 - 28.0 mmol/L - - 30.1(H) 30.5(H) 28.3(H)   TCO2 22 - 32 mmol/L - - 32 32 -   ACIDBASEDEF 0.0 - 2.0 mmol/L - - - - -   O2SAT % - - 98.0 64.0 91.8       Capillary Blood Glucose: Lab Results  Component Value Date   GLUCAP 145 (H) 10/30/2020   GLUCAP 97 10/24/2018   GLUCAP 95 07/16/2014   GLUCAP 87 07/16/2014   GLUCAP 89 07/15/2014     Pulmonary Assessment Scores:  Pulmonary Assessment Scores     Row Name 07/05/21 1353         ADL UCSD   ADL Phase Entry     SOB Score total 70     Rest 4     Walk 3     Stairs 2     Bath 3     Dress 3     Shop 3       CAT Score   CAT Score 24       mMRC Score   mMRC Score 3             UCSD: Self-administered rating of dyspnea associated with activities of daily living (ADLs) 6-point scale (0 = "not at all" to 5 = "maximal or unable to do because of breathlessness")  Scoring Scores range from 0 to 120.  Minimally important difference is 5 units  CAT: CAT can identify the health impairment of COPD patients and is better correlated with disease progression.  CAT has a scoring range of zero to 40. The CAT score is classified into four groups of low (less than 10), medium (10 - 20), high (21-30) and very high (31-40) based on the impact level of disease on health status. A CAT score over 10 suggests significant symptoms.  A worsening CAT score could be explained by an exacerbation, poor medication adherence, poor inhaler technique, or progression of COPD or comorbid conditions.  CAT MCID is 2 points  mMRC: mMRC (Modified Medical Research Council) Dyspnea Scale is used to assess the degree of baseline functional disability in patients of respiratory disease due to dyspnea. No minimal important difference is established. A decrease in score of 1 point or greater is considered a positive change.   Pulmonary Function Assessment:   Exercise Target Goals: Exercise Program Goal: Individual exercise  prescription set using results from initial 6 min walk test and THRR while considering  patients activity barriers and safety.   Exercise Prescription Goal: Initial exercise prescription builds to 30-45 minutes a day of aerobic activity, 2-3 days per week.  Home exercise guidelines will be  given to patient during program as part of exercise prescription that the participant will acknowledge.  Activity Barriers & Risk Stratification:  Activity Barriers & Cardiac Risk Stratification - 07/05/21 1420       Activity Barriers & Cardiac Risk Stratification   Activity Barriers Deconditioning;Shortness of Breath    Cardiac Risk Stratification High             6 Minute Walk:  6 Minute Walk     Row Name 07/05/21 1404         6 Minute Walk   Phase Initial     Distance 925 feet     Walk Time 6 minutes     # of Rest Breaks 1     MPH 1.75     METS 2.54     RPE 17     Perceived Dyspnea  13     VO2 Peak 8.9     Symptoms Yes (comment)     Comments 1 standing rest break for 30 sec due to SOB     Resting HR 84 bpm     Resting BP 144/88     Resting Oxygen Saturation  100 %     Exercise Oxygen Saturation  during 6 min walk 93 %     Max Ex. HR 113 bpm     Max Ex. BP 150/98     2 Minute Post BP 140/98       Interval HR   1 Minute HR 93     2 Minute HR 95     3 Minute HR 100     4 Minute HR 107     5 Minute HR 113     6 Minute HR 110     2 Minute Post HR 97     Interval Heart Rate? Yes       Interval Oxygen   Interval Oxygen? Yes     Baseline Oxygen Saturation % 100 %     1 Minute Oxygen Saturation % 99 %     1 Minute Liters of Oxygen 4 L     2 Minute Oxygen Saturation % 95 %     2 Minute Liters of Oxygen 4 L     3 Minute Oxygen Saturation % 97 %     3 Minute Liters of Oxygen 4 L     4 Minute Oxygen Saturation % 96 %     4 Minute Liters of Oxygen 4 L     5 Minute Oxygen Saturation % 94 %     5 Minute Liters of Oxygen 4 L     6 Minute Oxygen Saturation % 93 %     6 Minute  Liters of Oxygen 4 L     2 Minute Post Oxygen Saturation % 99 %     2 Minute Post Liters of Oxygen 4 L              Oxygen Initial Assessment:  Oxygen Initial Assessment - 07/05/21 1422       Home Oxygen   Home Oxygen Device E-Tanks;Home Concentrator    Sleep Oxygen Prescription CPAP    Liters per minute 0    Home Exercise Oxygen Prescription Continuous    Liters per minute 5    Home Resting Oxygen Prescription Continuous    Liters per minute 4    Compliance with Home Oxygen Use Yes      Initial 6 min Walk   Oxygen Used Continuous  Program Oxygen Prescription   Program Oxygen Prescription Continuous      Intervention   Short Term Goals To learn and demonstrate proper pursed lip breathing techniques or other breathing techniques. ;To learn and understand importance of monitoring SPO2 with pulse oximeter and demonstrate accurate use of the pulse oximeter.;To learn and exhibit compliance with exercise, home and travel O2 prescription;To learn and understand importance of maintaining oxygen saturations>88%    Long  Term Goals Exhibits compliance with exercise, home  and travel O2 prescription;Verbalizes importance of monitoring SPO2 with pulse oximeter and return demonstration;Maintenance of O2 saturations>88%;Exhibits proper breathing techniques, such as pursed lip breathing or other method taught during program session             Oxygen Re-Evaluation:   Oxygen Discharge (Final Oxygen Re-Evaluation):   Initial Exercise Prescription:  Initial Exercise Prescription - 07/05/21 1400       Date of Initial Exercise RX and Referring Provider   Date 07/05/21    Referring Provider Dr. Maryellen Pile    Expected Discharge Date 11/09/21      Oxygen   Oxygen Continuous    Liters 4    Maintain Oxygen Saturation 88% or higher      NuStep   Level 1    SPM 80    Minutes 22      Arm Ergometer   Level 1    RPM 60    Minutes 17      Prescription Details   Frequency  (times per week) 2    Duration Progress to 30 minutes of continuous aerobic without signs/symptoms of physical distress      Intensity   THRR 40-80% of Max Heartrate 60-121    Ratings of Perceived Exertion 11-13    Perceived Dyspnea 0-4      Resistance Training   Training Prescription Yes    Weight 3    Reps 10-15             Perform Capillary Blood Glucose checks as needed.  Exercise Prescription Changes:   Exercise Comments:   Exercise Goals and Review:   Exercise Goals     Row Name 07/05/21 1407             Exercise Goals   Increase Physical Activity Yes       Intervention Provide advice, education, support and counseling about physical activity/exercise needs.;Develop an individualized exercise prescription for aerobic and resistive training based on initial evaluation findings, risk stratification, comorbidities and participant's personal goals.       Expected Outcomes Short Term: Attend rehab on a regular basis to increase amount of physical activity.;Long Term: Exercising regularly at least 3-5 days a week.;Long Term: Add in home exercise to make exercise part of routine and to increase amount of physical activity.       Increase Strength and Stamina Yes       Intervention Provide advice, education, support and counseling about physical activity/exercise needs.;Develop an individualized exercise prescription for aerobic and resistive training based on initial evaluation findings, risk stratification, comorbidities and participant's personal goals.       Expected Outcomes Short Term: Increase workloads from initial exercise prescription for resistance, speed, and METs.;Short Term: Perform resistance training exercises routinely during rehab and add in resistance training at home;Long Term: Improve cardiorespiratory fitness, muscular endurance and strength as measured by increased METs and functional capacity ( )       Able to understand and use rate of perceived  exertion (RPE) scale Yes  Intervention Provide education and explanation on how to use RPE scale       Expected Outcomes Short Term: Able to use RPE daily in rehab to express subjective intensity level;Long Term:  Able to use RPE to guide intensity level when exercising independently       Able to understand and use Dyspnea scale Yes       Intervention Provide education and explanation on how to use Dyspnea scale       Expected Outcomes Short Term: Able to use Dyspnea scale daily in rehab to express subjective sense of shortness of breath during exertion;Long Term: Able to use Dyspnea scale to guide intensity level when exercising independently       Knowledge and understanding of Target Heart Rate Range (THRR) Yes       Intervention Provide education and explanation of THRR including how the numbers were predicted and where they are located for reference       Expected Outcomes Short Term: Able to state/look up THRR;Long Term: Able to use THRR to govern intensity when exercising independently;Short Term: Able to use daily as guideline for intensity in rehab       Understanding of Exercise Prescription Yes       Intervention Provide education, explanation, and written materials on patient's individual exercise prescription       Expected Outcomes Short Term: Able to explain program exercise prescription;Long Term: Able to explain home exercise prescription to exercise independently                Exercise Goals Re-Evaluation :   Discharge Exercise Prescription (Final Exercise Prescription Changes):   Nutrition:  Target Goals: Understanding of nutrition guidelines, daily intake of sodium 1500mg , cholesterol 200mg , calories 30% from fat and 7% or less from saturated fats, daily to have 5 or more servings of fruits and vegetables.  Biometrics:  Pre Biometrics - 07/05/21 1408       Pre Biometrics   Height 5' 6.5" (1.689 m)    Weight 80.2 kg    Waist Circumference 44 inches     Hip Circumference 42.5 inches    Waist to Hip Ratio 1.04 %    BMI (Calculated) 28.11    Triceps Skinfold 20 mm    % Body Fat 31 %    Grip Strength 37.5 kg    Flexibility 4.5 in    Single Leg Stand 2.72 seconds              Nutrition Therapy Plan and Nutrition Goals:  Nutrition Therapy & Goals - 07/05/21 1347       Personal Nutrition Goals   Comments Patient scored 57 on his diet assessment. Handout provided and explained regarding heathier choices. Patient verbalized understanding. We offer 2 educational sessions regarding heart healthy nutrition with handouts and assistance with RD referral if patient is interested.      Intervention Plan   Intervention Nutrition handout(s) given to patient.    Expected Outcomes Short Term Goal: Understand basic principles of dietary content, such as calories, fat, sodium, cholesterol and nutrients.             Nutrition Assessments:  Nutrition Assessments - 07/05/21 1347       MEDFICTS Scores   Pre Score 57            MEDIFICTS Score Key: ?70 Need to make dietary changes  40-70 Heart Healthy Diet ? 40 Therapeutic Level Cholesterol Diet   Picture Your Plate Scores: <40 Unhealthy dietary  pattern with much room for improvement. 41-50 Dietary pattern unlikely to meet recommendations for good health and room for improvement. 51-60 More healthful dietary pattern, with some room for improvement.  >60 Healthy dietary pattern, although there may be some specific behaviors that could be improved.    Nutrition Goals Re-Evaluation:   Nutrition Goals Discharge (Final Nutrition Goals Re-Evaluation):   Psychosocial: Target Goals: Acknowledge presence or absence of significant depression and/or stress, maximize coping skills, provide positive support system. Participant is able to verbalize types and ability to use techniques and skills needed for reducing stress and depression.  Initial Review & Psychosocial Screening:  Initial  Psych Review & Screening - 07/05/21 1405       Initial Review   Current issues with Current Sleep Concerns   PTSD     Family Dynamics   Good Support System? Yes      Barriers   Psychosocial barriers to participate in program There are no identifiable barriers or psychosocial needs.   No psychosocial barriers identified but does have psychosocial needs.     Screening Interventions   Interventions Encouraged to exercise    Expected Outcomes Short Term goal: Identification and review with participant of any Quality of Life or Depression concerns found by scoring the questionnaire.;Long Term goal: The participant improves quality of Life and PHQ9 Scores as seen by post scores and/or verbalization of changes             Quality of Life Scores:  Quality of Life - 07/05/21 1403       Quality of Life   Select Quality of Life      Quality of Life Scores   Health/Function Pre 18.9 %    Socioeconomic Pre 19.08 %    Psych/Spiritual Pre 29.14 %    Family Pre 14.25 %    GLOBAL Pre 20.59 %            Scores of 19 and below usually indicate a poorer quality of life in these areas.  A difference of  2-3 points is a clinically meaningful difference.  A difference of 2-3 points in the total score of the Quality of Life Index has been associated with significant improvement in overall quality of life, self-image, physical symptoms, and general health in studies assessing change in quality of life.   PHQ-9: Recent Review Flowsheet Data     Depression screen [facility] 2/9 07/05/2021 03/04/2021 10/23/2017 07/13/2017 05/23/2017   Decreased Interest 1 0 0 0 0   Down, Depressed, Hopeless 1 2 0 1 0   PHQ - 2 Score 2 2 0 1 0   Altered sleeping 2 2 - 1 -   Tired, decreased energy 2 3 - 0 -   Change in appetite 1 2 - 0 -   Feeling bad or failure about yourself  0 0 - 0 -   Trouble concentrating 1 0 - 0 -   Moving slowly or fidgety/restless 0 1 - 0 -   Suicidal thoughts 0 0 - 0 -   PHQ-9 Score 8 10 -  2 -   Difficult doing work/chores Somewhat difficult Somewhat difficult - Not difficult at all -      Interpretation of Total Score  Total Score Depression Severity:  1-4 = Minimal depression, 5-9 = Mild depression, 10-14 = Moderate depression, 15-19 = Moderately severe depression, 20-27 = Severe depression   Psychosocial Evaluation and Intervention:  Psychosocial Evaluation - 07/05/21 1408  Psychosocial Evaluation & Interventions   Interventions Encouraged to exercise with the program and follow exercise prescription    Comments Patient has no psychosocial barriers identified. 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 His PTSD is managed with Duloxetine 30 mg and Mirtazepine 30 mg and he sees a psychriatist at the [place] monthly. He says he also talks to his pastor a lot about his emotional needs. He feels his PTSD is managed at this time but is an ongoing issue for him. He says he still has frequent bad dreams about [place] and this along with wearing a CPAP often disrupts his sleep but he is able to cope. He has 3 daughters, 2 that he is close with and multiple grandchildren and 3 sisters. He says all of his family support him and would help him with anything he needed. His initial PHQ-9 score was 8 related mainly to his SOB limiting his activity. He is ready to start the program hoping to get his lungs stronger enabling him to have lung reduction surgery.    Expected Outcomes Patient will continue to have no psychosocial barriers identified and his PTSD will continue to be managed.    Continue Psychosocial Services  No Follow up required             Psychosocial Re-Evaluation:   Psychosocial Discharge (Final Psychosocial Re-Evaluation):    Education: Education Goals: Education classes will be provided on a weekly basis, covering required topics. Participant will state understanding/return demonstration of topics presented.  Learning  Barriers/Preferences:   Education Topics: How Lungs Work and Diseases: - Discuss the anatomy of the lungs and diseases that can affect the lungs, such as COPD.   Exercise: -Discuss the importance of exercise, FITT principles of exercise, normal and abnormal responses to exercise, and how to exercise safely.   Environmental Irritants: -Discuss types of environmental irritants and how to limit exposure to environmental irritants.   Meds/Inhalers and oxygen: - Discuss respiratory medications, definition of an inhaler and oxygen, and the proper way to use an inhaler and oxygen.   Energy Saving Techniques: - Discuss methods to conserve energy and decrease shortness of breath when performing activities of daily living.    Bronchial Hygiene / Breathing Techniques: - Discuss breathing mechanics, pursed-lip breathing technique,  proper posture, effective ways to clear airways, and other functional breathing techniques   Cleaning Equipment: - Provides group verbal and written instruction about the health risks of elevated stress, cause of high stress, and healthy ways to reduce stress.   Nutrition I: Fats: - Discuss the types of cholesterol, what cholesterol does to the body, and how cholesterol levels can be controlled.   Nutrition II: Labels: -Discuss the different components of food labels and how to read food labels.   Respiratory Infections: - Discuss the signs and symptoms of respiratory infections, ways to prevent respiratory infections, and the importance of seeking medical treatment when having a respiratory infection. Flowsheet Row PULMONARY REHAB CHRONIC OBSTRUCTIVE PULMONARY DISEASE from 03/25/2021 in [facility]  Date 03/25/21  Educator DF  Instruction Review Code 2- Demonstrated Understanding       Stress I: Signs and Symptoms: - Discuss the causes of stress, how stress may lead to anxiety and depression, and ways to limit  stress.   Stress II: Relaxation: -Discuss relaxation techniques to limit stress.   Oxygen for Home/Travel: - Discuss how to prepare for travel when on oxygen and proper ways  to transport and store oxygen to ensure safety.   Knowledge Questionnaire Score:  Knowledge Questionnaire Score - 07/05/21 1349       Knowledge Questionnaire Score   Pre Score 12/18             Core Components/Risk Factors/Patient Goals at Admission:  Personal Goals and Risk Factors at Admission - 07/05/21 1350       Core Components/Risk Factors/Patient Goals on Admission    Weight Management Weight Maintenance    Improve shortness of breath with ADL's Yes    Intervention Provide education, individualized exercise plan and daily activity instruction to help decrease symptoms of SOB with activities of daily living.    Expected Outcomes Short Term: Improve cardiorespiratory fitness to achieve a reduction of symptoms when performing ADLs;Long Term: Be able to perform more ADLs without symptoms or delay the onset of symptoms    Personal Goal Other Yes    Personal Goal Patient wants to strength his lungs getting ready to have lung reduction surgery. He would like to improve his breathing overall and lower oxygen liter flow to 3 to 4 L/M.    Intervention Paitent will attend the PR program with exercise and education and supplement with exercise at home.    Expected Outcomes Patient complete the program meeting both personal and program goals.             Core Components/Risk Factors/Patient Goals Review:    Core Components/Risk Factors/Patient Goals at Discharge (Final Review):    ITP Comments:   Comments: Patient arrived for 1st visit/orientation/education at 1230. Patient was referred to PR by Dr. Pernell Dupre due to COPD. During orientation advised patient on arrival and appointment times what to wear, what to do before, during and after exercise. Reviewed attendance and class policy.  Pt is  scheduled to return Pulmonary Rehab on 07/08/21 at 10:45. Pt was advised to come to class 15 minutes before class starts.  Discussed RPE/Dpysnea scales. Patient participated in warm up stretches. Patient was able to complete 6 minute walk test. Patient was measured for the equipment. Discussed equipment safety with patient. Took patient pre-anthropometric measurements. Patient finished visit at 1400.

## 2021-07-06 ENCOUNTER — Encounter (HOSPITAL_COMMUNITY): Payer: Medicare Other

## 2021-07-06 ENCOUNTER — Encounter (HOSPITAL_COMMUNITY): Payer: No Typology Code available for payment source

## 2021-07-08 ENCOUNTER — Encounter (HOSPITAL_COMMUNITY): Payer: Medicare Other

## 2021-07-08 ENCOUNTER — Encounter (HOSPITAL_COMMUNITY)
Admission: RE | Admit: 2021-07-08 | Discharge: 2021-07-08 | Disposition: A | Discharge status: Home / Self Care | Payer: No Typology Code available for payment source | Source: Ambulatory Visit | Attending: Pathology | Admitting: Pathology

## 2021-07-08 DIAGNOSIS — J449 Chronic obstructive pulmonary disease, unspecified: Secondary | ICD-10-CM | POA: Diagnosis present

## 2021-07-08 NOTE — Progress Notes (Signed)
Daily Session Note ? ?Patient Details  ?Name: Gerald Williams ?MRN: [id number] ?Date of Birth: 01-Jul-1950 ?Referring Provider:   ?Flowsheet Row PULMONARY REHAB COPD ORIENTATION from 07/05/2021 in [place]  ?Referring Provider Dr. Spero Curb  ? ?  ? ? ?Encounter Date: 07/08/2021 ? ?Check In: ? Session Check In - 07/08/21 1041   ? ?  ? Check-In  ? Supervising physician immediately available to respond to emergencies [facility] MD immediately available   ? Physician(s) Dr Harl Bowie   ? Location [facility]   ? Staff Present Geanie Cooley, RN;Heather Otho Ket, BS, Exercise Physiologist;Debra Wynetta Emery, RN, Joanette Gula, RN, BSN   ? Virtual Visit No   ? Medication changes reported     No   ? Fall or balance concerns reported    Yes   ? Comments He has fallen once this year due to his right leg "giving out". He also has diabetic neuropathy that alters his balance as well.   ? Tobacco Cessation No Change   ? Warm-up and Cool-down Performed as group-led instruction   ? Resistance Training Performed No   ? VAD Patient? No   ? PAD/SET Patient? No   ?  ? Pain Assessment  ? Currently in Pain? No/denies   ? Multiple Pain Sites No   ? ?  ?  ? ?  ? ? ?Capillary Blood Glucose: ?No results found for this or any previous visit (from the past 24 hour(s)). ? ? ? ?Social History  ? ?Tobacco Use  ?Smoking Status Former  ? Packs/day: 1.50  ? Years: 50.00  ? Pack years: 75.00  ? Types: Cigarettes  ? Start date: 04/26/1963  ? Quit date: 09/22/2013  ? Years since quitting: 7.7  ?Smokeless Tobacco Never  ?Tobacco Comments  ? patient using the patch, losenges, and medication  ? ? ?Goals Met:  ?Independence with exercise equipment ?Exercise tolerated well ?No report of concerns or symptoms today ?Strength training completed today ? ?Goals Unmet:  ?Not Applicable ? ?Comments: Check out at 1145 ? ? ?Dr. Carlyle Dolly is Medical Director for [place] ?

## 2021-07-13 ENCOUNTER — Encounter (HOSPITAL_COMMUNITY): Payer: Medicare Other

## 2021-07-13 ENCOUNTER — Encounter (HOSPITAL_COMMUNITY)
Admission: RE | Admit: 2021-07-13 | Discharge: 2021-07-13 | Disposition: A | Discharge status: Home / Self Care | Payer: No Typology Code available for payment source | Source: Ambulatory Visit | Attending: Pathology | Admitting: Pathology

## 2021-07-13 DIAGNOSIS — J449 Chronic obstructive pulmonary disease, unspecified: Secondary | ICD-10-CM | POA: Diagnosis not present

## 2021-07-13 NOTE — Progress Notes (Signed)
Daily Session Note ? ?Patient Details  ?Name: Gerald Williams ?MRN: [id number] ?Date of Birth: 11-16-1950 ?Referring Provider:   ?Flowsheet Row PULMONARY REHAB COPD ORIENTATION from 07/05/2021 in [place]  ?Referring Provider Dr. Spero Curb  ? ?  ? ? ?Encounter Date: 07/13/2021 ? ?Check In: ? Session Check In - 07/13/21 1043   ? ?  ? Check-In  ? Supervising physician immediately available to respond to emergencies [facility] MD immediately available   ? Physician(s) Dr Domenic Polite   ? Location [facility]   ? Staff Present Redge Gainer, BS, Exercise Physiologist;Raiza Kiesel Hassell Done, RN, Bjorn Loser, MS, ACSM-CEP, Exercise Physiologist   ? Virtual Visit No   ? Medication changes reported     No   ? Fall or balance concerns reported    Yes   ? Comments He has fallen once this year due to his right leg "giving out". He also has diabetic neuropathy that alters his balance as well.   ? Tobacco Cessation No Change   ? Warm-up and Cool-down Performed as group-led instruction   ? Resistance Training Performed Yes   ? VAD Patient? No   ? PAD/SET Patient? No   ?  ? Pain Assessment  ? Currently in Pain? No/denies   ? Multiple Pain Sites No   ? ?  ?  ? ?  ? ? ?Capillary Blood Glucose: ?No results found for this or any previous visit (from the past 24 hour(s)). ? ? ? ?Social History  ? ?Tobacco Use  ?Smoking Status Former  ? Packs/day: 1.50  ? Years: 50.00  ? Pack years: 75.00  ? Types: Cigarettes  ? Start date: 04/26/1963  ? Quit date: 09/22/2013  ? Years since quitting: 7.8  ?Smokeless Tobacco Never  ?Tobacco Comments  ? patient using the patch, losenges, and medication  ? ? ?Goals Met:  ?Independence with exercise equipment ?Exercise tolerated well ?No report of concerns or symptoms today ?Strength training completed today ? ?Goals Unmet:  ?Not Applicable ? ?Comments: Check out  ? ? ? ?Dr. Kathie Dike is Medical Director for [facility] Pulmonary Rehab. ?

## 2021-07-15 ENCOUNTER — Encounter (HOSPITAL_COMMUNITY): Payer: Medicare Other

## 2021-07-15 ENCOUNTER — Encounter (HOSPITAL_COMMUNITY)
Admission: RE | Admit: 2021-07-15 | Discharge: 2021-07-15 | Disposition: A | Discharge status: Home / Self Care | Payer: No Typology Code available for payment source | Source: Ambulatory Visit | Attending: Pathology | Admitting: Pathology

## 2021-07-15 DIAGNOSIS — J449 Chronic obstructive pulmonary disease, unspecified: Secondary | ICD-10-CM

## 2021-07-15 NOTE — Progress Notes (Signed)
Daily Session Note ? ?Patient Details  ?Name: Gerald Williams ?MRN: [id number] ?Date of Birth: 05-10-1950 ?Referring Provider:   ?Flowsheet Row PULMONARY REHAB COPD ORIENTATION from 07/05/2021 in [place]  ?Referring Provider Dr. Spero Curb  ? ?  ? ? ?Encounter Date: 07/15/2021 ? ?Check In: ? Session Check In - 07/15/21 1045   ? ?  ? Check-In  ? Supervising physician immediately available to respond to emergencies [facility] MD immediately available   ? Physician(s) Dr Debara Pickett   ? Location [facility]   ? Staff Present Redge Gainer, BS, Exercise Physiologist;Terre Zabriskie Hassell Done, RN, BSN   ? Virtual Visit No   ? Medication changes reported     No   ? Fall or balance concerns reported    No   ? Comments He has fallen once this year due to his right leg "giving out". He also has diabetic neuropathy that alters his balance as well.   ? Tobacco Cessation No Change   ? Warm-up and Cool-down Performed as group-led instruction   ? Resistance Training Performed Yes   ? VAD Patient? No   ? PAD/SET Patient? No   ?  ? Pain Assessment  ? Currently in Pain? No/denies   ? Multiple Pain Sites No   ? ?  ?  ? ?  ? ? ?Capillary Blood Glucose: ?No results found for this or any previous visit (from the past 24 hour(s)). ? ? ? ?Social History  ? ?Tobacco Use  ?Smoking Status Former  ? Packs/day: 1.50  ? Years: 50.00  ? Pack years: 75.00  ? Types: Cigarettes  ? Start date: 04/26/1963  ? Quit date: 09/22/2013  ? Years since quitting: 7.8  ?Smokeless Tobacco Never  ?Tobacco Comments  ? patient using the patch, losenges, and medication  ? ? ?Goals Met:  ?Independence with exercise equipment ?Exercise tolerated well ?No report of concerns or symptoms today ?Strength training completed today ? ?Goals Unmet:  ?Not Applicable ? ?Comments: Checkout at 1145 ? ? ?Dr. Kathie Dike is Medical Director for [facility] Pulmonary Rehab. ?

## 2021-07-19 ENCOUNTER — Telehealth: Payer: Self-pay | Admitting: Internal Medicine

## 2021-07-19 DIAGNOSIS — J449 Chronic obstructive pulmonary disease, unspecified: Secondary | ICD-10-CM

## 2021-07-19 NOTE — Telephone Encounter (Signed)
Fine with me - if [facility] is paying for it though need to do the referral ?

## 2021-07-19 NOTE — Telephone Encounter (Signed)
Spoke to patient. ? He would like a referral to [facility] to discuss zephyr valve. ? ?Dr. Sherene Sires, please advise.  ?

## 2021-07-19 NOTE — Telephone Encounter (Signed)
Patient states that he is not going through the [place]- will go through [facility] and Medicaid. Patient would like referral sent as soon as possible so he can schedule an appointment. ?

## 2021-07-20 ENCOUNTER — Encounter (HOSPITAL_COMMUNITY)
Admission: RE | Admit: 2021-07-20 | Discharge: 2021-07-20 | Disposition: A | Discharge status: Home / Self Care | Payer: No Typology Code available for payment source | Source: Ambulatory Visit | Attending: Pathology | Admitting: Pathology

## 2021-07-20 VITALS — Wt 175.7 lb

## 2021-07-20 DIAGNOSIS — J449 Chronic obstructive pulmonary disease, unspecified: Secondary | ICD-10-CM | POA: Diagnosis not present

## 2021-07-20 NOTE — Progress Notes (Signed)
Daily Session Note ? ?Patient Details  ?Name: Gerald Williams ?MRN: [id number] ?Date of Birth: 12/12/1950 ?Referring Provider:   ?Flowsheet Row PULMONARY REHAB COPD ORIENTATION from 07/05/2021 in [place]  ?Referring Provider Dr. Spero Curb  ? ?  ? ? ?Encounter Date: 07/20/2021 ? ?Check In: ? Session Check In - 07/20/21 1045   ? ?  ? Check-In  ? Supervising physician immediately available to respond to emergencies [facility] MD immediately available   ? Physician(s) Dr Marlou Porch   ? Location [facility]   ? Staff Present Madelyn Flavors, RN, Bjorn Loser, MS, ACSM-CEP, Exercise Physiologist;Heather Zigmund Daniel, Exercise Physiologist   ? Virtual Visit No   ? Medication changes reported     No   ? Fall or balance concerns reported    No   ? Comments He has fallen once this year due to his right leg "giving out". He also has diabetic neuropathy that alters his balance as well.   ? Tobacco Cessation No Change   ? Warm-up and Cool-down Performed as group-led instruction   ? Resistance Training Performed Yes   ? VAD Patient? No   ? PAD/SET Patient? No   ?  ? Pain Assessment  ? Currently in Pain? No/denies   ? Multiple Pain Sites No   ? ?  ?  ? ?  ? ? ?Capillary Blood Glucose: ?No results found for this or any previous visit (from the past 24 hour(s)). ? ? ? ?Social History  ? ?Tobacco Use  ?Smoking Status Former  ? Packs/day: 1.50  ? Years: 50.00  ? Pack years: 75.00  ? Types: Cigarettes  ? Start date: 04/26/1963  ? Quit date: 09/22/2013  ? Years since quitting: 7.8  ?Smokeless Tobacco Never  ?Tobacco Comments  ? patient using the patch, losenges, and medication  ? ? ?Goals Met:  ?Independence with exercise equipment ?Exercise tolerated well ?No report of concerns or symptoms today ?Strength training completed today ? ?Goals Unmet:  ?Not Applicable ? ?Comments: Checkout at 1145. ? ? ?Dr. Kathie Dike is Medical Director for [facility] Pulmonary Rehab. ?

## 2021-07-20 NOTE — Telephone Encounter (Signed)
Spoke to patient and verified that he will not be going through the [place].  ?Referral placed. ?Nothing further needed.  ? ?

## 2021-07-21 NOTE — Progress Notes (Signed)
Pulmonary Individual Treatment Plan ? ?Patient Details  ?Name: Gerald Williams ?MRN: [id number] ?Date of Birth: 1950/06/17 ?Referring Provider:   ?Flowsheet Row PULMONARY REHAB COPD ORIENTATION from 07/05/2021 in [place] [facility]  ?Referring Provider Dr. Maryellen Pile  ? ?  ? ? ?Initial Encounter Date:  ?Flowsheet Row PULMONARY REHAB COPD ORIENTATION from 07/05/2021 in [place] [facility]  ?Date 07/05/21  ? ?  ? ? ?Visit Diagnosis: Chronic obstructive pulmonary disease, unspecified COPD type (HCC) ? ?Patient's Home Medications on Admission:  ? ?Current Outpatient Medications:  ?  acetaminophen (TYLENOL) 500 MG tablet, Take 1,000 mg by mouth every 6 (six) hours as needed for mild pain, fever or headache., Disp: , Rfl:  ?  albuterol (PROVENTIL) (2.5 MG/3ML) 0.083% nebulizer solution, Take 2.5 mg by nebulization every 6 (six) hours as needed for wheezing or shortness of breath., Disp: , Rfl:  ?  atorvastatin (LIPITOR) 80 MG tablet, Take 80 mg by mouth daily., Disp: , Rfl:  ?  diclofenac Sodium (VOLTAREN) 1 % GEL, Apply 2 g topically 4 (four) times daily., Disp: 150 g, Rfl: 1 ?  DULoxetine (CYMBALTA) 60 MG capsule, TAKE ONE CAPSULE BY MOUTH TWO TIMES A DAY FOR POSTTRAUMATIC STRESS DISORDER AND DEPRESSION, Disp: , Rfl:  ?  fluticasone (FLONASE) 50 MCG/ACT nasal spray, INSTILL 2 SPRAYS INTO EACH NOSTRIL EVERY DAY MAXIMUM 2 SPRAYS IN EACH NOSTRIL DAILY. FOR ALLERGIES, Disp: , Rfl:  ?  furosemide (LASIX) 20 MG tablet, Take 1 tablet (20 mg total) by mouth daily., Disp: 90 tablet, Rfl: 1 ?  glucose blood (COOL BLOOD GLUCOSE TEST STRIPS) test strip, Used to test blood sugar x2 daily---diagnosis code r73.03--for one touch verio flex, Disp: 200 each, Rfl: 3 ?  guaiFENesin (MUCINEX) 600 MG 12 hr tablet, Take 600 mg by mouth 2 (two) times daily as needed., Disp: , Rfl:  ?  ipratropium (ATROVENT) 0.03 % nasal spray, Place 1 spray into both nostrils every 8 (eight) hours as needed for rhinitis., Disp: 30 mL,  Rfl: 5 ?  Ipratropium-Albuterol (COMBIVENT RESPIMAT) 20-100 MCG/ACT AERS respimat, Inhale 1 puff into the lungs every 6 (six) hours., Disp: 4 g, Rfl: 6 ?  Lancets MISC, Used to test blood sugar x2 daily---diagnosis code r73.03--for one touch verio flex, Disp: 200 each, Rfl: 3 ?  losartan (COZAAR) 50 MG tablet, Take 1 tablet (50 mg total) by mouth daily., Disp: 90 tablet, Rfl: 1 ?  metoprolol succinate (TOPROL-XL) 50 MG 24 hr tablet, Take 1 tablet (50 mg total) by mouth daily. Take with or immediately following a meal., Disp: 90 tablet, Rfl: 1 ?  mirtazapine (REMERON) 30 MG tablet, Take 30 mg by mouth at bedtime., Disp: , Rfl:  ?  OXYGEN, Inhale 4-5 L into the lungs continuous., Disp: , Rfl:  ?  SYMBICORT 160-4.5 MCG/ACT inhaler, Inhale 2 puffs into the lungs daily., Disp: , Rfl:  ?  tamsulosin (FLOMAX) 0.4 MG CAPS capsule, Take 0.4 mg by mouth daily., Disp: , Rfl:  ? ?Past Medical History: ?Past Medical History:  ?Diagnosis Date  ? CHF (congestive heart failure) (HCC)   ? Emphysema   ? Erectile dysfunction   ? Hyperlipemia   ? Hypertension   ? Polysubstance abuse (HCC)   ? stopped using crack/cocaine December 4th 20-14  ? PTSD (post-traumatic stress disorder)   ? Sleep apnea   ? ? ?Tobacco Use: ?Social History  ? ?Tobacco Use  ?Smoking Status Former  ? Packs/day: 1.50  ? Years: 50.00  ? Pack years: 75.00  ?  Types: Cigarettes  ? Start date: 04/26/1963  ? Quit date: 09/22/2013  ? Years since quitting: 7.8  ?Smokeless Tobacco Never  ?Tobacco Comments  ? patient using the patch, losenges, and medication  ? ? ?Labs: ?Review Flowsheet   ? ?  ?  Latest Ref Rng & Units 01/01/2016 05/07/2019 10/29/2020 10/30/2020  ?Labs for ITP Cardiac and Pulmonary Rehab  ?Cholestrol 0 - 200 mg/dL  009   233     ?LDL (calc) 0 - 99 mg/dL  007   622     ?HDL-C >40 mg/dL  59   79     ?Trlycerides <150 mg/dL  84   92     ?Hemoglobin A1c 4.8 - 5.6 % 6.2   6.1      ?PH, Arterial 7.350 - 7.450      ?PCO2 arterial 32.0 - 48.0 mmHg      ?Bicarbonate 20.0 -  28.0 mmol/L    30.1    ? 30.5    ?TCO2 22 - 32 mmol/L    32    ? 32    ?O2 Saturation %    98.0    ? 64.0    ? ?  10/31/2020  ?Labs for ITP Cardiac and Pulmonary Rehab  ?Cholestrol   ?LDL (calc)   ?HDL-C   ?Trlycerides   ?Hemoglobin A1c   ?PH, Arterial 7.371    ?PCO2 arterial 50.2    ?Bicarbonate 28.3    ?TCO2   ?O2 Saturation 91.8    ?  ? ? Multiple values from one day are sorted in reverse-chronological order  ?  ?  ? ? ?Capillary Blood Glucose: ?Lab Results  ?Component Value Date  ? GLUCAP 145 (H) 10/30/2020  ? GLUCAP 97 10/24/2018  ? GLUCAP 95 07/16/2014  ? GLUCAP 87 07/16/2014  ? GLUCAP 89 07/15/2014  ? ? ? ?Pulmonary Assessment Scores: ? Pulmonary Assessment Scores   ? ? Row Name 07/05/21 1353  ?  ?  ?  ? ADL UCSD  ? ADL Phase Entry    ? SOB Score total 70    ? Rest 4    ? Walk 3    ? Stairs 2    ? Bath 3    ? Dress 3    ? Shop 3    ?  ? CAT Score  ? CAT Score 24    ?  ? mMRC Score  ? mMRC Score 3    ? ?  ?  ? ?  ? ?UCSD: ?Self-administered rating of dyspnea associated with activities of daily living (ADLs) ?6-point scale (0 = "not at all" to 5 = "maximal or unable to do because of breathlessness")  ?Scoring Scores range from 0 to 120.  Minimally important difference is 5 units ? ?CAT: ?CAT can identify the health impairment of COPD patients and is better correlated with disease progression.  ?CAT has a scoring range of zero to 40. The CAT score is classified into four groups of low (less than 10), medium (10 - 20), high (21-30) and very high (31-40) based on the impact level of disease on health status. A CAT score over 10 suggests significant symptoms.  A worsening CAT score could be explained by an exacerbation, poor medication adherence, poor inhaler technique, or progression of COPD or comorbid conditions.  ?CAT MCID is 2 points ? ?mMRC: ?mMRC (Modified Medical Research Council) Dyspnea Scale is used to assess the degree of baseline functional disability in patients of respiratory disease  due to  dyspnea. ?No minimal important difference is established. A decrease in score of 1 point or greater is considered a positive change.  ? ?Pulmonary Function Assessment: ? ? ?Exercise Target Goals: ?Exercise Program Goal: ?Individual exercise prescription set using results from initial 6 min walk test and THRR while considering  patient?s activity barriers and safety.  ? ?Exercise Prescription Goal: ?Initial exercise prescription builds to 30-45 minutes a day of aerobic activity, 2-3 days per week.  Home exercise guidelines will be given to patient during program as part of exercise prescription that the participant will acknowledge. ? ?Activity Barriers & Risk Stratification: ? Activity Barriers & Cardiac Risk Stratification - 07/05/21 1420   ? ?  ? Activity Barriers & Cardiac Risk Stratification  ? Activity Barriers Deconditioning;Shortness of Breath   ? Cardiac Risk Stratification High   ? ?  ?  ? ?  ? ? ?6 Minute Walk: ? 6 Minute Walk   ? ? Row Name 07/05/21 1404  ?  ?  ?  ? 6 Minute Walk  ? Phase Initial    ? Distance 925 feet    ? Walk Time 6 minutes    ? # of Rest Breaks 1    ? MPH 1.75    ? METS 2.54    ? RPE 17    ? Perceived Dyspnea  13    ? VO2 Peak 8.9    ? Symptoms Yes (comment)    ? Comments 1 standing rest break for 30 sec due to SOB    ? Resting HR 84 bpm    ? Resting BP 144/88    ? Resting Oxygen Saturation  100 %    ? Exercise Oxygen Saturation  during 6 min walk 93 %    ? Max Ex. HR 113 bpm    ? Max Ex. BP 150/98    ? 2 Minute Post BP 140/98    ?  ? Interval HR  ? 1 Minute HR 93    ? 2 Minute HR 95    ? 3 Minute HR 100    ? 4 Minute HR 107    ? 5 Minute HR 113    ? 6 Minute HR 110    ? 2 Minute Post HR 97    ? Interval Heart Rate? Yes    ?  ? Interval Oxygen  ? Interval Oxygen? Yes    ? Baseline Oxygen Saturation % 100 %    ? 1 Minute Oxygen Saturation % 99 %    ? 1 Minute Liters of Oxygen 4 L    ? 2 Minute Oxygen Saturation % 95 %    ? 2 Minute Liters of Oxygen 4 L    ? 3 Minute Oxygen Saturation %  97 %    ? 3 Minute Liters of Oxygen 4 L    ? 4 Minute Oxygen Saturation % 96 %    ? 4 Minute Liters of Oxygen 4 L    ? 5 Minute Oxygen Saturation % 94 %    ? 5 Minute Liters of Oxygen 4 L    ? 6 Minute Oxygen Satur

## 2021-07-22 ENCOUNTER — Encounter (HOSPITAL_COMMUNITY): Payer: No Typology Code available for payment source

## 2021-07-27 ENCOUNTER — Telehealth: Payer: Self-pay | Admitting: Internal Medicine

## 2021-07-27 ENCOUNTER — Encounter (HOSPITAL_COMMUNITY): Payer: No Typology Code available for payment source

## 2021-07-27 NOTE — Telephone Encounter (Addendum)
I called [facility] and spoke to [place].  She found the referral I just faxed on 3/29.  She stated they have to manually enter referral and they just had not gotten to it yet.  She pulled it and states she will call pt for appt.  I called pt & made him aware referral had been received by [facility].  Nothing further needed. ?

## 2021-07-29 ENCOUNTER — Encounter (HOSPITAL_COMMUNITY)
Admission: RE | Admit: 2021-07-29 | Discharge: 2021-07-29 | Disposition: A | Discharge status: Home / Self Care | Payer: No Typology Code available for payment source | Source: Ambulatory Visit | Attending: Pathology | Admitting: Pathology

## 2021-07-29 DIAGNOSIS — J449 Chronic obstructive pulmonary disease, unspecified: Secondary | ICD-10-CM | POA: Diagnosis not present

## 2021-07-29 NOTE — Progress Notes (Signed)
Daily Session Note ? ?Patient Details  ?Name: Gerald Williams ?MRN: [id number] ?Date of Birth: 12/29/1950 ?Referring Provider:   ?Flowsheet Row PULMONARY REHAB COPD ORIENTATION from 07/05/2021 in [place]  ?Referring Provider Dr. Spero Curb  ? ?  ? ? ?Encounter Date: 07/29/2021 ? ?Check In: ? Session Check In - 07/29/21 1044   ? ?  ? Check-In  ? Supervising physician immediately available to respond to emergencies [facility] MD immediately available   ? Physician(s) Dr Johney Frame   ? Location [facility]   ? Staff Present Aundra Dubin, RN, Joanette Gula, RN, Bjorn Loser, MS, ACSM-CEP, Exercise Physiologist   ? Virtual Visit No   ? Medication changes reported     No   ? Fall or balance concerns reported    No   ? Comments He has fallen once this year due to his right leg "giving out". He also has diabetic neuropathy that alters his balance as well.   ? Tobacco Cessation No Change   ? Warm-up and Cool-down Performed as group-led instruction   ? Resistance Training Performed No   ? VAD Patient? No   ? PAD/SET Patient? No   ?  ? Pain Assessment  ? Currently in Pain? No/denies   ? Multiple Pain Sites No   ? ?  ?  ? ?  ? ? ?Capillary Blood Glucose: ?No results found for this or any previous visit (from the past 24 hour(s)). ? ? ? ?Social History  ? ?Tobacco Use  ?Smoking Status Former  ? Packs/day: 1.50  ? Years: 50.00  ? Pack years: 75.00  ? Types: Cigarettes  ? Start date: 04/26/1963  ? Quit date: 09/22/2013  ? Years since quitting: 7.8  ?Smokeless Tobacco Never  ?Tobacco Comments  ? patient using the patch, losenges, and medication  ? ? ?Goals Met:  ?Independence with exercise equipment ?Exercise tolerated well ?No report of concerns or symptoms today ?Strength training completed today ? ?Goals Unmet:  ?Not Applicable ? ?Comments: Check out at 1145 ? ? ?Dr. Kathie Dike is Medical Director for [facility] Pulmonary Rehab. ?

## 2021-08-03 ENCOUNTER — Encounter (HOSPITAL_COMMUNITY)
Admission: RE | Admit: 2021-08-03 | Discharge: 2021-08-03 | Disposition: A | Discharge status: Home / Self Care | Payer: No Typology Code available for payment source | Source: Ambulatory Visit | Attending: Pathology | Admitting: Pathology

## 2021-08-03 VITALS — Wt 174.8 lb

## 2021-08-03 DIAGNOSIS — J449 Chronic obstructive pulmonary disease, unspecified: Secondary | ICD-10-CM | POA: Diagnosis not present

## 2021-08-03 NOTE — Progress Notes (Signed)
Daily Session Note ? ?Patient Details  ?Name: Gerald Williams ?MRN: [id number] ?Date of Birth: 07/19/1950 ?Referring Provider:   ?Flowsheet Row PULMONARY REHAB COPD ORIENTATION from 07/05/2021 in [place]  ?Referring Provider Dr. Spero Curb  ? ?  ? ? ?Encounter Date: 08/03/2021 ? ?Check In: ? Session Check In - 08/03/21 1045   ? ?  ? Check-In  ? Supervising physician immediately available to respond to emergencies [facility] MD immediately available   ? Physician(s) Dr Radford Pax   ? Location [facility]   ? Staff Present Redge Gainer, BS, Exercise Physiologist;Laural Eiland Hassell Done, RN, Bjorn Loser, MS, ACSM-CEP, Exercise Physiologist   ? Virtual Visit No   ? Medication changes reported     No   ? Fall or balance concerns reported    No   ? Comments He has fallen once this year due to his right leg "giving out". He also has diabetic neuropathy that alters his balance as well.   ? Tobacco Cessation No Change   ? Warm-up and Cool-down Performed as group-led instruction   ? Resistance Training Performed Yes   ? VAD Patient? No   ? PAD/SET Patient? No   ?  ? Pain Assessment  ? Currently in Pain? No/denies   ? Multiple Pain Sites No   ? ?  ?  ? ?  ? ? ?Capillary Blood Glucose: ?No results found for this or any previous visit (from the past 24 hour(s)). ? ? ? ?Social History  ? ?Tobacco Use  ?Smoking Status Former  ? Packs/day: 1.50  ? Years: 50.00  ? Pack years: 75.00  ? Types: Cigarettes  ? Start date: 04/26/1963  ? Quit date: 09/22/2013  ? Years since quitting: 7.8  ?Smokeless Tobacco Never  ?Tobacco Comments  ? patient using the patch, losenges, and medication  ? ? ?Goals Met:  ?Independence with exercise equipment ?Exercise tolerated well ?No report of concerns or symptoms today ?Strength training completed today ? ?Goals Unmet:  ?Not Applicable ? ?Comments: Check out at 1145. ? ? ?Dr. Kathie Dike is Medical Director for [facility] Pulmonary Rehab. ?

## 2021-08-05 ENCOUNTER — Encounter (HOSPITAL_COMMUNITY)
Admission: RE | Admit: 2021-08-05 | Discharge: 2021-08-05 | Disposition: A | Discharge status: Home / Self Care | Payer: No Typology Code available for payment source | Source: Ambulatory Visit | Attending: Pathology | Admitting: Pathology

## 2021-08-05 DIAGNOSIS — J449 Chronic obstructive pulmonary disease, unspecified: Secondary | ICD-10-CM | POA: Diagnosis not present

## 2021-08-05 NOTE — Progress Notes (Signed)
Daily Session Note ? ?Patient Details  ?Name: Gerald Williams ?MRN: [id number] ?Date of Birth: 1950/07/24 ?Referring Provider:   ?Flowsheet Row PULMONARY REHAB COPD ORIENTATION from 07/05/2021 in [place]  ?Referring Provider Dr. Spero Curb  ? ?  ? ? ?Encounter Date: 08/05/2021 ? ?Check In: ? Session Check In - 08/05/21 1045   ? ?  ? Check-In  ? Supervising physician immediately available to respond to emergencies [facility] MD immediately available   ? Physician(s) Dr. Johney Frame   ? Location [facility]   ? Staff Present Geanie Cooley, RN;Camela Wich Otho Ket, BS, Exercise Physiologist;Daphyne Hassell Done, RN, BSN   ? Virtual Visit No   ? Medication changes reported     No   ? Fall or balance concerns reported    No   ? Tobacco Cessation No Change   ? Warm-up and Cool-down Performed as group-led instruction   ? Resistance Training Performed Yes   ? VAD Patient? No   ? PAD/SET Patient? No   ?  ? Pain Assessment  ? Currently in Pain? No/denies   ? Multiple Pain Sites No   ? ?  ?  ? ?  ? ? ?Capillary Blood Glucose: ?No results found for this or any previous visit (from the past 24 hour(s)). ? ? ? ?Social History  ? ?Tobacco Use  ?Smoking Status Former  ? Packs/day: 1.50  ? Years: 50.00  ? Pack years: 75.00  ? Types: Cigarettes  ? Start date: 04/26/1963  ? Quit date: 09/22/2013  ? Years since quitting: 7.8  ?Smokeless Tobacco Never  ?Tobacco Comments  ? patient using the patch, losenges, and medication  ? ? ?Goals Met:  ?Independence with exercise equipment ?Exercise tolerated well ?No report of concerns or symptoms today ?Strength training completed today ? ?Goals Unmet:  ?Not Applicable ? ?Comments: check out 1145 ? ? ?Dr. Kathie Dike is Medical Director for [facility] Pulmonary Rehab. ?

## 2021-08-10 ENCOUNTER — Encounter (HOSPITAL_COMMUNITY)
Admission: RE | Admit: 2021-08-10 | Discharge: 2021-08-10 | Disposition: A | Discharge status: Home / Self Care | Payer: No Typology Code available for payment source | Source: Ambulatory Visit | Attending: Pathology | Admitting: Pathology

## 2021-08-10 DIAGNOSIS — J449 Chronic obstructive pulmonary disease, unspecified: Secondary | ICD-10-CM

## 2021-08-10 NOTE — Progress Notes (Signed)
Daily Session Note ? ?Patient Details  ?Name: Gerald Williams ?MRN: [id number] ?Date of Birth: Jan 01, 1951 ?Referring Provider:   ?Flowsheet Row PULMONARY REHAB COPD ORIENTATION from 07/05/2021 in [place]  ?Referring Provider Dr. Spero Curb  ? ?  ? ? ?Encounter Date: 08/10/2021 ? ?Check In: ? Session Check In - 08/10/21 1045   ? ?  ? Check-In  ? Supervising physician immediately available to respond to emergencies [facility] MD immediately available   ? Physician(s) Dr Harl Bowie   ? Location [facility]   ? Staff Present Madelyn Flavors, RN, BSN;Heather Otho Ket, BS, Exercise Physiologist   ? Virtual Visit No   ? Medication changes reported     No   ? Fall or balance concerns reported    No   ? Comments He has fallen once this year due to his right leg "giving out". He also has diabetic neuropathy that alters his balance as well.   ? Tobacco Cessation No Change   ? Warm-up and Cool-down Performed as group-led instruction   ? Resistance Training Performed Yes   ? VAD Patient? No   ? PAD/SET Patient? No   ?  ? Pain Assessment  ? Currently in Pain? No/denies   ? Multiple Pain Sites No   ? ?  ?  ? ?  ? ? ?Capillary Blood Glucose: ?No results found for this or any previous visit (from the past 24 hour(s)). ? ? ? ?Social History  ? ?Tobacco Use  ?Smoking Status Former  ? Packs/day: 1.50  ? Years: 50.00  ? Pack years: 75.00  ? Types: Cigarettes  ? Start date: 04/26/1963  ? Quit date: 09/22/2013  ? Years since quitting: 7.8  ?Smokeless Tobacco Never  ?Tobacco Comments  ? patient using the patch, losenges, and medication  ? ? ?Goals Met:  ?Independence with exercise equipment ?Exercise tolerated well ?No report of concerns or symptoms today ?Strength training completed today ? ?Goals Unmet:  ?Not Applicable ? ?Comments: Checkout at 1145. ? ? ?Dr. Kathie Dike is Medical Director for [facility] Pulmonary Rehab. ?

## 2021-08-12 ENCOUNTER — Encounter (HOSPITAL_COMMUNITY)
Admission: RE | Admit: 2021-08-12 | Discharge: 2021-08-12 | Disposition: A | Discharge status: Home / Self Care | Payer: No Typology Code available for payment source | Source: Ambulatory Visit | Attending: Pathology | Admitting: Pathology

## 2021-08-12 DIAGNOSIS — J449 Chronic obstructive pulmonary disease, unspecified: Secondary | ICD-10-CM | POA: Diagnosis not present

## 2021-08-12 NOTE — Progress Notes (Signed)
Daily Session Note ? ?Patient Details  ?Name: Gerald Williams ?MRN: [id number] ?Date of Birth: October 02, 1950 ?Referring Provider:   ?Flowsheet Row PULMONARY REHAB COPD ORIENTATION from 07/05/2021 in [place]  ?Referring Provider Dr. Spero Curb  ? ?  ? ? ?Encounter Date: 08/12/2021 ? ?Check In: ? Session Check In - 08/12/21 1045   ? ?  ? Check-In  ? Supervising physician immediately available to respond to emergencies [facility] MD immediately available   ? Physician(s) Dr Harl Bowie   ? Location [facility]   ? Staff Present Redge Gainer, BS, Exercise Physiologist;Christy Oletta Lamas, RN, BSN   ? Virtual Visit No   ? Medication changes reported     No   ? Fall or balance concerns reported    No   ? Tobacco Cessation No Change   ? Warm-up and Cool-down Performed as group-led instruction   ? Resistance Training Performed Yes   ? VAD Patient? No   ? PAD/SET Patient? No   ?  ? Pain Assessment  ? Currently in Pain? No/denies   ? Multiple Pain Sites No   ? ?  ?  ? ?  ? ? ?Capillary Blood Glucose: ?No results found for this or any previous visit (from the past 24 hour(s)). ? ? ? ?Social History  ? ?Tobacco Use  ?Smoking Status Former  ? Packs/day: 1.50  ? Years: 50.00  ? Pack years: 75.00  ? Types: Cigarettes  ? Start date: 04/26/1963  ? Quit date: 09/22/2013  ? Years since quitting: 7.8  ?Smokeless Tobacco Never  ?Tobacco Comments  ? patient using the patch, losenges, and medication  ? ? ?Goals Met:  ?Independence with exercise equipment ?Exercise tolerated well ?No report of concerns or symptoms today ?Strength training completed today ? ?Goals Unmet:  ?Not Applicable ? ?Comments: check out 1145 ? ? ? ?Dr. Kathie Dike is Medical Director for [facility] Pulmonary Rehab. ?

## 2021-08-17 ENCOUNTER — Encounter (HOSPITAL_COMMUNITY)
Admission: RE | Admit: 2021-08-17 | Discharge: 2021-08-17 | Disposition: A | Discharge status: Home / Self Care | Payer: No Typology Code available for payment source | Source: Ambulatory Visit | Attending: Pathology | Admitting: Pathology

## 2021-08-17 VITALS — Wt 177.2 lb

## 2021-08-17 DIAGNOSIS — J449 Chronic obstructive pulmonary disease, unspecified: Secondary | ICD-10-CM

## 2021-08-17 NOTE — Progress Notes (Signed)
Daily Session Note ? ?Patient Details  ?Name: Gerald Williams ?MRN: [id number] ?Date of Birth: 12-16-1950 ?Referring Provider:   ?Flowsheet Row PULMONARY REHAB COPD ORIENTATION from 07/05/2021 in [place]  ?Referring Provider Dr. Spero Curb  ? ?  ? ? ?Encounter Date: 08/17/2021 ? ?Check In: ? Session Check In - 08/17/21 1045   ? ?  ? Check-In  ? Supervising physician immediately available to respond to emergencies [facility] MD immediately available   ? Physician(s) Dr. Radford Pax   ? Location [facility]   ? Staff Present Redge Gainer, BS, Exercise Physiologist;Madlynn Lundeen, MS, ACSM-CEP, Exercise Physiologist   ? Virtual Visit No   ? Medication changes reported     No   ? Fall or balance concerns reported    No   ? Tobacco Cessation No Change   ? Warm-up and Cool-down Performed as group-led instruction   ? Resistance Training Performed Yes   ? VAD Patient? No   ? PAD/SET Patient? No   ?  ? Pain Assessment  ? Currently in Pain? No/denies   ? Multiple Pain Sites No   ? ?  ?  ? ?  ? ? ?Capillary Blood Glucose: ?No results found for this or any previous visit (from the past 24 hour(s)). ? ? ? ?Social History  ? ?Tobacco Use  ?Smoking Status Former  ? Packs/day: 1.50  ? Years: 50.00  ? Pack years: 75.00  ? Types: Cigarettes  ? Start date: 04/26/1963  ? Quit date: 09/22/2013  ? Years since quitting: 7.9  ?Smokeless Tobacco Never  ?Tobacco Comments  ? patient using the patch, losenges, and medication  ? ? ?Goals Met:  ?Independence with exercise equipment ?Exercise tolerated well ?No report of concerns or symptoms today ?Strength training completed today ? ?Goals Unmet:  ?Not Applicable ? ?Comments: checkout time is 1145 ? ? ?Dr. Kathie Dike is Medical Director for [facility] Pulmonary Rehab. ?

## 2021-08-18 NOTE — Progress Notes (Signed)
Pulmonary Individual Treatment Plan ? ?Patient Details  ?Name: Gerald Williams ?MRN: [id number] ?Date of Birth: 1950/06/17 ?Referring Provider:   ?Flowsheet Row PULMONARY REHAB COPD ORIENTATION from 07/05/2021 in [place] [facility]  ?Referring Provider Dr. Maryellen Pile  ? ?  ? ? ?Initial Encounter Date:  ?Flowsheet Row PULMONARY REHAB COPD ORIENTATION from 07/05/2021 in [place] [facility]  ?Date 07/05/21  ? ?  ? ? ?Visit Diagnosis: Chronic obstructive pulmonary disease, unspecified COPD type (HCC) ? ?Patient's Home Medications on Admission:  ? ?Current Outpatient Medications:  ?  acetaminophen (TYLENOL) 500 MG tablet, Take 1,000 mg by mouth every 6 (six) hours as needed for mild pain, fever or headache., Disp: , Rfl:  ?  albuterol (PROVENTIL) (2.5 MG/3ML) 0.083% nebulizer solution, Take 2.5 mg by nebulization every 6 (six) hours as needed for wheezing or shortness of breath., Disp: , Rfl:  ?  atorvastatin (LIPITOR) 80 MG tablet, Take 80 mg by mouth daily., Disp: , Rfl:  ?  diclofenac Sodium (VOLTAREN) 1 % GEL, Apply 2 g topically 4 (four) times daily., Disp: 150 g, Rfl: 1 ?  DULoxetine (CYMBALTA) 60 MG capsule, TAKE ONE CAPSULE BY MOUTH TWO TIMES A DAY FOR POSTTRAUMATIC STRESS DISORDER AND DEPRESSION, Disp: , Rfl:  ?  fluticasone (FLONASE) 50 MCG/ACT nasal spray, INSTILL 2 SPRAYS INTO EACH NOSTRIL EVERY DAY MAXIMUM 2 SPRAYS IN EACH NOSTRIL DAILY. FOR ALLERGIES, Disp: , Rfl:  ?  furosemide (LASIX) 20 MG tablet, Take 1 tablet (20 mg total) by mouth daily., Disp: 90 tablet, Rfl: 1 ?  glucose blood (COOL BLOOD GLUCOSE TEST STRIPS) test strip, Used to test blood sugar x2 daily---diagnosis code r73.03--for one touch verio flex, Disp: 200 each, Rfl: 3 ?  guaiFENesin (MUCINEX) 600 MG 12 hr tablet, Take 600 mg by mouth 2 (two) times daily as needed., Disp: , Rfl:  ?  ipratropium (ATROVENT) 0.03 % nasal spray, Place 1 spray into both nostrils every 8 (eight) hours as needed for rhinitis., Disp: 30 mL,  Rfl: 5 ?  Ipratropium-Albuterol (COMBIVENT RESPIMAT) 20-100 MCG/ACT AERS respimat, Inhale 1 puff into the lungs every 6 (six) hours., Disp: 4 g, Rfl: 6 ?  Lancets MISC, Used to test blood sugar x2 daily---diagnosis code r73.03--for one touch verio flex, Disp: 200 each, Rfl: 3 ?  losartan (COZAAR) 50 MG tablet, Take 1 tablet (50 mg total) by mouth daily., Disp: 90 tablet, Rfl: 1 ?  metoprolol succinate (TOPROL-XL) 50 MG 24 hr tablet, Take 1 tablet (50 mg total) by mouth daily. Take with or immediately following a meal., Disp: 90 tablet, Rfl: 1 ?  mirtazapine (REMERON) 30 MG tablet, Take 30 mg by mouth at bedtime., Disp: , Rfl:  ?  OXYGEN, Inhale 4-5 L into the lungs continuous., Disp: , Rfl:  ?  SYMBICORT 160-4.5 MCG/ACT inhaler, Inhale 2 puffs into the lungs daily., Disp: , Rfl:  ?  tamsulosin (FLOMAX) 0.4 MG CAPS capsule, Take 0.4 mg by mouth daily., Disp: , Rfl:  ? ?Past Medical History: ?Past Medical History:  ?Diagnosis Date  ? CHF (congestive heart failure) (HCC)   ? Emphysema   ? Erectile dysfunction   ? Hyperlipemia   ? Hypertension   ? Polysubstance abuse (HCC)   ? stopped using crack/cocaine December 4th 20-14  ? PTSD (post-traumatic stress disorder)   ? Sleep apnea   ? ? ?Tobacco Use: ?Social History  ? ?Tobacco Use  ?Smoking Status Former  ? Packs/day: 1.50  ? Years: 50.00  ? Pack years: 75.00  ?  Types: Cigarettes  ? Start date: 04/26/1963  ? Quit date: 09/22/2013  ? Years since quitting: 7.9  ?Smokeless Tobacco Never  ?Tobacco Comments  ? patient using the patch, losenges, and medication  ? ? ?Labs: ?Review Flowsheet   ? ?  ?  Latest Ref Rng & Units 01/01/2016 05/07/2019 10/29/2020 10/30/2020  ?Labs for [facility] Cardiac and Pulmonary Rehab  ?Cholestrol 0 - 200 mg/dL  144   315     ?LDL (calc) 0 - 99 mg/dL  400   867     ?HDL-C >40 mg/dL  59   79     ?Trlycerides <150 mg/dL  84   92     ?Hemoglobin A1c 4.8 - 5.6 % 6.2   6.1      ?PH, Arterial 7.350 - 7.450      ?PCO2 arterial 32.0 - 48.0 mmHg      ?Bicarbonate 20.0 -  28.0 mmol/L    30.1    ? 30.5    ?TCO2 22 - 32 mmol/L    32    ? 32    ?O2 Saturation %    98.0    ? 64.0    ? ?  10/31/2020  ?Labs for [facility]P Cardiac and Pulmonary Rehab  ?Cholestrol   ?LDL (calc)   ?HDL-C   ?Trlycerides   ?Hemoglobin A1c   ?PH, Arterial 7.371    ?PCO2 arterial 50.2    ?Bicarbonate 28.3    ?TCO2   ?O2 Saturation 91.8    ?  ? ? Multiple values from one day are sorted in reverse-chronological order  ?  ?  ? ? ?Capillary Blood Glucose: ?Lab Results  ?Component Value Date  ? GLUCAP 145 (H) 10/30/2020  ? GLUCAP 97 10/24/2018  ? GLUCAP 95 07/16/2014  ? GLUCAP 87 07/16/2014  ? GLUCAP 89 07/15/2014  ? ? ? ?Pulmonary Assessment Scores: ? Pulmonary Assessment Scores   ? ? Row Name 07/05/21 1353  ?  ?  ?  ? ADL UCSD  ? ADL Phase Entry    ? SOB Score total 70    ? Rest 4    ? Walk 3    ? Stairs 2    ? Bath 3    ? Dress 3    ? Shop 3    ?  ? CAT Score  ? CAT Score 24    ?  ? mMRC Score  ? mMRC Score 3    ? ?  ?  ? ?  ? ?UCSD: ?Self-administered rating of dyspnea associated with activities of daily living (ADLs) ?6-point scale (0 = "not at all" to 5 = "maximal or unable to do because of breathlessness")  ?Scoring Scores range from 0 to 120.  Minimally important difference is 5 units ? ?CAT: ?CAT can identify the health impairment of COPD patients and is better correlated with disease progression.  ?CAT has a scoring range of zero to 40. The CAT score is classified into four groups of low (less than 10), medium (10 - 20), high (21-30) and very high (31-40) based on the impact level of disease on health status. A CAT score over 10 suggests significant symptoms.  A worsening CAT score could be explained by an exacerbation, poor medication adherence, poor inhaler technique, or progression of COPD or comorbid conditions.  ?CAT MCID is 2 points ? ?mMRC: ?mMRC (Modified Medical Research Council) Dyspnea Scale is used to assess the degree of baseline functional disability in patients of respiratory disease  due to  dyspnea. ?No minimal important difference is established. A decrease in score of 1 point or greater is considered a positive change.  ? ?Pulmonary Function Assessment: ? ? ?Exercise Target Goals: ?Exercise Program Goal: ?Individual exercise prescription set using results from initial 6 min walk test and THRR while considering  patient?s activity barriers and safety.  ? ?Exercise Prescription Goal: ?Initial exercise prescription builds to 30-45 minutes a day of aerobic activity, 2-3 days per week.  Home exercise guidelines will be given to patient during program as part of exercise prescription that the participant will acknowledge. ? ?Activity Barriers & Risk Stratification: ? Activity Barriers & Cardiac Risk Stratification - 07/05/21 1420   ? ?  ? Activity Barriers & Cardiac Risk Stratification  ? Activity Barriers Deconditioning;Shortness of Breath   ? Cardiac Risk Stratification High   ? ?  ?  ? ?  ? ? ?6 Minute Walk: ? 6 Minute Walk   ? ? Row Name 07/05/21 1404  ?  ?  ?  ? 6 Minute Walk  ? Phase Initial    ? Distance 925 feet    ? Walk Time 6 minutes    ? # of Rest Breaks 1    ? MPH 1.75    ? METS 2.54    ? RPE 17    ? Perceived Dyspnea  13    ? VO2 Peak 8.9    ? Symptoms Yes (comment)    ? Comments 1 standing rest break for 30 sec due to SOB    ? Resting HR 84 bpm    ? Resting BP 144/88    ? Resting Oxygen Saturation  100 %    ? Exercise Oxygen Saturation  during 6 min walk 93 %    ? Max Ex. HR 113 bpm    ? Max Ex. BP 150/98    ? 2 Minute Post BP 140/98    ?  ? Interval HR  ? 1 Minute HR 93    ? 2 Minute HR 95    ? 3 Minute HR 100    ? 4 Minute HR 107    ? 5 Minute HR 113    ? 6 Minute HR 110    ? 2 Minute Post HR 97    ? Interval Heart Rate? Yes    ?  ? Interval Oxygen  ? Interval Oxygen? Yes    ? Baseline Oxygen Saturation % 100 %    ? 1 Minute Oxygen Saturation % 99 %    ? 1 Minute Liters of Oxygen 4 L    ? 2 Minute Oxygen Saturation % 95 %    ? 2 Minute Liters of Oxygen 4 L    ? 3 Minute Oxygen Saturation %  97 %    ? 3 Minute Liters of Oxygen 4 L    ? 4 Minute Oxygen Saturation % 96 %    ? 4 Minute Liters of Oxygen 4 L    ? 5 Minute Oxygen Saturation % 94 %    ? 5 Minute Liters of Oxygen 4 L    ? 6 Minute Oxygen Satur

## 2021-08-19 ENCOUNTER — Encounter (HOSPITAL_COMMUNITY)
Admission: RE | Admit: 2021-08-19 | Discharge: 2021-08-19 | Disposition: A | Discharge status: Home / Self Care | Payer: No Typology Code available for payment source | Source: Ambulatory Visit | Attending: Pathology | Admitting: Pathology

## 2021-08-19 DIAGNOSIS — J449 Chronic obstructive pulmonary disease, unspecified: Secondary | ICD-10-CM

## 2021-08-19 NOTE — Progress Notes (Signed)
Daily Session Note ? ?Patient Details  ?Name: Gerald Williams ?MRN: [id number] ?Date of Birth: 04-07-1951 ?Referring Provider:   ?Flowsheet Row PULMONARY REHAB COPD ORIENTATION from 07/05/2021 in [place]  ?Referring Provider Dr. Spero Curb  ? ?  ? ? ?Encounter Date: 08/19/2021 ? ?Check In: ? Session Check In - 08/19/21 1045   ? ?  ? Check-In  ? Supervising physician immediately available to respond to emergencies [facility] MD immediately available   ? Physician(s) Dr Mikki Harbor   ? Location [facility]   ? Staff Present Madelyn Flavors, RN, BSN;Heather Otho Ket, BS, Exercise Physiologist   ? Virtual Visit No   ? Medication changes reported     No   ? Fall or balance concerns reported    No   ? Comments He has fallen once this year due to his right leg "giving out". He also has diabetic neuropathy that alters his balance as well.   ? Warm-up and Cool-down Performed as group-led instruction   ? Resistance Training Performed Yes   ? VAD Patient? No   ? PAD/SET Patient? No   ?  ? Pain Assessment  ? Currently in Pain? No/denies   ? Multiple Pain Sites No   ? ?  ?  ? ?  ? ? ?Capillary Blood Glucose: ?No results found for this or any previous visit (from the past 24 hour(s)). ? ? ? ?Social History  ? ?Tobacco Use  ?Smoking Status Former  ? Packs/day: 1.50  ? Years: 50.00  ? Pack years: 75.00  ? Types: Cigarettes  ? Start date: 04/26/1963  ? Quit date: 09/22/2013  ? Years since quitting: 7.9  ?Smokeless Tobacco Never  ?Tobacco Comments  ? patient using the patch, losenges, and medication  ? ? ?Goals Met:  ?Independence with exercise equipment ?Exercise tolerated well ?No report of concerns or symptoms today ?Strength training completed today ? ?Goals Unmet:  ?Not Applicable ? ?Comments: check out at 1145. ? ? ?Dr. Kathie Dike is Medical Director for [facility] Pulmonary Rehab. ?

## 2021-08-24 ENCOUNTER — Encounter (HOSPITAL_COMMUNITY)
Admission: RE | Admit: 2021-08-24 | Discharge: 2021-08-24 | Disposition: A | Discharge status: Home / Self Care | Payer: No Typology Code available for payment source | Source: Ambulatory Visit | Attending: Pathology | Admitting: Pathology

## 2021-08-24 DIAGNOSIS — J449 Chronic obstructive pulmonary disease, unspecified: Secondary | ICD-10-CM | POA: Diagnosis present

## 2021-08-24 NOTE — Progress Notes (Signed)
Daily Session Note ? ?Patient Details  ?Name: Gerald Williams ?MRN: [id number] ?Date of Birth: 1950-12-26 ?Referring Provider:   ?Flowsheet Row PULMONARY REHAB COPD ORIENTATION from 07/05/2021 in [place]  ?Referring Provider Dr. Spero Curb  ? ?  ? ? ?Encounter Date: 08/24/2021 ? ?Check In: ? Session Check In - 08/24/21 1045   ? ?  ? Check-In  ? Supervising physician immediately available to respond to emergencies [facility] MD immediately available   ? Physician(s) Dr Harl Bowie   ? Location [facility]   ? Staff Present Madelyn Flavors, RN, BSN;Heather Otho Ket, BS, Exercise Physiologist;Dalton Kris Mouton, MS, ACSM-CEP, Exercise Physiologist   ? Virtual Visit No   ? Medication changes reported     No   ? Fall or balance concerns reported    No   ? Comments He has fallen once this year due to his right leg "giving out". He also has diabetic neuropathy that alters his balance as well.   ? Tobacco Cessation No Change   ? Warm-up and Cool-down Performed as group-led instruction   ? Resistance Training Performed Yes   ? VAD Patient? No   ? PAD/SET Patient? No   ?  ? Pain Assessment  ? Currently in Pain? No/denies   ? Multiple Pain Sites No   ? ?  ?  ? ?  ? ? ?Capillary Blood Glucose: ?No results found for this or any previous visit (from the past 24 hour(s)). ? ? ? ?Social History  ? ?Tobacco Use  ?Smoking Status Former  ? Packs/day: 1.50  ? Years: 50.00  ? Pack years: 75.00  ? Types: Cigarettes  ? Start date: 04/26/1963  ? Quit date: 09/22/2013  ? Years since quitting: 7.9  ?Smokeless Tobacco Never  ?Tobacco Comments  ? patient using the patch, losenges, and medication  ? ? ?Goals Met:  ?Independence with exercise equipment ?Exercise tolerated well ?No report of concerns or symptoms today ?Strength training completed today ? ?Goals Unmet:  ?Not Applicable ? ?Comments: Check out at 1145. ? ? ?Dr. Kathie Dike is Medical Director for [facility] Pulmonary Rehab. ?

## 2021-08-26 ENCOUNTER — Encounter (HOSPITAL_COMMUNITY): Payer: No Typology Code available for payment source

## 2021-08-27 ENCOUNTER — Ambulatory Visit: Payer: No Typology Code available for payment source | Admitting: Internal Medicine

## 2021-08-27 NOTE — Progress Notes (Deleted)
Follow Up Note  RE: Gerald Williams MRN: [id number] DOB: 1950/12/14 Date of Office Visit: 08/27/2021  Referring provider: [facility] Primary care provider: [facility]  Chief Complaint: No chief complaint on file.  History of Present Illness: I had the pleasure of seeing Gerald Williams for a follow up visit at the [facility] on 08/27/2021. He is a 71 y.o. male, who is being followed for COPD, Rhinitis Medicantosa. His previous allergy office visit was on 09/24/2020 with Dr. Dellis Anes. Today is a regular follow up visit.  At last visit Zicam was discontinued due to it containing oxymetazoline.  He was given a prednisone pack and increased on his intranasal steroids.  Chronic rhinitis/rhinitis medication dosing current therapy: Flonase 1 spray per nostril twice daily, Astelin per nostril twice daily,  symptoms {Blank single:19197::"improved","partially improved","not improved"} symptoms include: {Blank multiple:19196:a:"***","nasal congestion","rhinorrhea","post nasal drainage","sneezing","watery eyes","itchy eyes","itchy nose"} Previous allergy testing:  Yes specific IgE done in 2022 which was positive to mold, total IgE was 1490 History of reflux/heartburn: {Blank single:19197::"yes","no"} Interested in Allergy Immunotherapy: no  COPD -he is followed by Dr. Rayvon Char in pulmonology.  Last FEV1 was 41%.  Current medications include Symbicort 2 puffs twice daily, Combivent as needed. 43-pack-year history quit at age 2.  He recently reengaged with pulmonary rehab.  He is in discussion with pulmonary for lung volume reduction surgery Today he reports: Assessment and Plan: Gerald Williams is a 70 y.o. male with: No diagnosis found. Plan: There are no Patient Instructions on file for this visit. No follow-ups on file.  No orders of the defined types were placed in this encounter.   Lab Orders  No laboratory test(s) ordered today    Diagnostics: Spirometry:  Tracings reviewed. His effort: {Blank single:19197::"Good reproducible efforts.","It was hard to get consistent efforts and there is a question as to whether this reflects a maximal maneuver.","Poor effort, data can not be interpreted."} FVC: ***L FEV1: ***L, ***% predicted FEV1/FVC ratio: ***% Interpretation: {Blank single:19197::"Spirometry consistent with mild obstructive disease","Spirometry consistent with moderate obstructive disease","Spirometry consistent with severe obstructive disease","Spirometry consistent with possible restrictive disease","Spirometry consistent with mixed obstructive and restrictive disease","Spirometry uninterpretable due to technique","Spirometry consistent with normal pattern","No overt abnormalities noted given today's efforts"}.  Please see scanned spirometry results for details.     Medication List:  Current Outpatient Medications  Medication Sig Dispense Refill   acetaminophen (TYLENOL) 500 MG tablet Take 1,000 mg by mouth every 6 (six) hours as needed for mild pain, fever or headache.     albuterol (PROVENTIL) (2.5 MG/3ML) 0.083% nebulizer solution Take 2.5 mg by nebulization every 6 (six) hours as needed for wheezing or shortness of breath.     atorvastatin (LIPITOR) 80 MG tablet Take 80 mg by mouth daily.     diclofenac Sodium (VOLTAREN) 1 % GEL Apply 2 g topically 4 (four) times daily. 150 g 1   DULoxetine (CYMBALTA) 60 MG capsule TAKE ONE CAPSULE BY MOUTH TWO TIMES A DAY FOR POSTTRAUMATIC STRESS DISORDER AND DEPRESSION     fluticasone (FLONASE) 50 MCG/ACT nasal spray INSTILL 2 SPRAYS INTO EACH NOSTRIL EVERY DAY MAXIMUM 2 SPRAYS IN EACH NOSTRIL DAILY. FOR ALLERGIES     furosemide (LASIX) 20 MG tablet Take 1 tablet (20 mg total) by mouth daily. 90 tablet 1   glucose blood (COOL BLOOD GLUCOSE TEST STRIPS) test strip Used to test blood sugar x2 daily---diagnosis code r73.03--for one touch verio flex 200 each 3   guaiFENesin  (MUCINEX) 600 MG  12 hr tablet Take 600 mg by mouth 2 (two) times daily as needed.     ipratropium (ATROVENT) 0.03 % nasal spray Place 1 spray into both nostrils every 8 (eight) hours as needed for rhinitis. 30 mL 5   Ipratropium-Albuterol (COMBIVENT RESPIMAT) 20-100 MCG/ACT AERS respimat Inhale 1 puff into the lungs every 6 (six) hours. 4 g 6   Lancets MISC Used to test blood sugar x2 daily---diagnosis code r73.03--for one touch verio flex 200 each 3   losartan (COZAAR) 50 MG tablet Take 1 tablet (50 mg total) by mouth daily. 90 tablet 1   metoprolol succinate (TOPROL-XL) 50 MG 24 hr tablet Take 1 tablet (50 mg total) by mouth daily. Take with or immediately following a meal. 90 tablet 1   mirtazapine (REMERON) 30 MG tablet Take 30 mg by mouth at bedtime.     OXYGEN Inhale 4-5 L into the lungs continuous.     SYMBICORT 160-4.5 MCG/ACT inhaler Inhale 2 puffs into the lungs daily.     tamsulosin (FLOMAX) 0.4 MG CAPS capsule Take 0.4 mg by mouth daily.     No current facility-administered medications for this visit.   Allergies: Allergies  Allergen Reactions   Tiotropium Other (See Comments)    Other reaction(s): Cough, Dysphagia, Cough, Dysphagia   Gabapentin     Other reaction(s): Dizziness   Nortriptyline     Other reaction(s): Drowsy   Budeson-Glycopyrrol-Formoterol Other (See Comments)    Other reaction(s): Retention of urine, Constipation, Retention of urine, Constipation   Olodaterol Hives and Rash   Oxcarbazepine Nausea And Vomiting   Tiotropium Bromide-Olodaterol Itching   I reviewed his past medical history, social history, family history, and environmental history and no significant changes have been reported from his previous visit.  ROS: All others negative except as noted per HPI.   Objective: There were no vitals taken for this visit. There is no height or weight on file to calculate BMI. General Appearance:  Alert, cooperative, no distress, appears stated age  Head:   Normocephalic, without obvious abnormality, atraumatic  Eyes:  Conjunctiva clear, EOM's intact  Nose: Nares normal, {Blank multiple:19196:a:"***","hypertrophic turbinates","normal mucosa","no visible anterior polyps","septum midline"}  Throat: Lips, tongue normal; teeth and gums normal, {Blank multiple:19196:a:"***","normal posterior oropharynx","tonsils 2+","tonsils 3+","no tonsillar exudate","+ cobblestoning"}  Neck: Supple, symmetrical  Lungs:   {Blank multiple:19196:a:"***","clear to auscultation bilaterally","end-expiratory wheezing","wheezing throughout"}, Respirations unlabored, {Blank multiple:19196:a:"***","no coughing","intermittent dry coughing"}  Heart:  {Blank multiple:19196:a:"***","regular rate and rhythm","no murmur"}, Appears well perfused  Extremities: No edema  Skin: Skin color, texture, turgor normal, no rashes or lesions on visualized portions of skin  Neurologic: No gross deficits   Previous notes and tests were reviewed. The plan was reviewed with the patient/family, and all questions/concerned were addressed.  It was my pleasure to see Gerald Williams today and participate in his care. Please feel free to contact me with any questions or concerns.  Sincerely,  Ferol Luz, MD  Allergy & Immunology  [facility]

## 2021-08-27 NOTE — Patient Instructions (Incomplete)
Chronic rhinitis: *** controlled  ?-Previous testing: Specific IgE positive to indoor and outdoor mold only ?-Continue avoidance measures ?- Stop taking: *** ?- Continue with: {Blank multiple:19196:a:"Allegra (fexofenadine) 180mg  table once daily","Allegra (fexofenadine) 22mL twice daily","Zyrtec (cetirizine) 10mg  tablet once daily","Zyrtec (cetirizine) ***mL once daily","Xyzal (levocetirizine) 5mg  tablet once daily","Xyzal (levocetirizine) ***mL once daily","Claritin (loratadine) 10mg  tablet once daily","Claritin (loratadine) ***mL once daily","Ryvent (carbinoxamine) 6mg  tablet 3-4 times daily as needed","RyClora *** mL every 6 hours as needed","Karbinal ER *** mL every 12 hours as needed","Singulair (montelukast) ***mg daily","Flonase (fluticasone) one spray per nostril daily","Flonase (fluticasone) two sprays per nostril daily","Flonase (fluticasone) one spray per nostril on Mon/Wed/Fri","Xhance (fluticasone) 1-2 sprays per nostril daily","Xhance (fluticasone) 1-2 sprays per nostril twice daily","Nasacort (triamcinolone) one spray per nostril daily","Nasacort (triamcinolone) two sprays per nostril daily","Nasonex (momentason) one spray per nostril daily","Nasonex (mometasone) two sprays per nostril daily","Astelin (azelastine) 2 sprays per nostril 1-2 times daily as needed","Patanase (olopatadine) two sprays per nostril 1-2 times daily as needed","Dymista (fluticasone/azelastine) two sprays per nostril 1-2 times daily as needed","Pataday (olopatadine) one drop per eye twice daily as needed","Pazeo (olopatadine) one drop per eye daily as needed","Bepreve (bepotastine) one drop per eye twice daily as needed)"} ?- Start taking: {Blank multiple:19196:a:"Allegra (fexofenadine) 180mg  table once daily","Allegra (fexofenadine) 42mL twice daily","Zyrtec (cetirizine) 10mg  tablet once daily","Zyrtec (cetirizine) ***mL once daily","Xyzal (levocetirizine) 5mg  tablet once daily","Xyzal (levocetirizine) ***mL once  daily","Claritin (loratadine) 10mg  tablet once daily","Claritin (loratadine) ***mL once daily","Ryvent (carbinoxamine) 6mg  tablet 3-4 times daily as needed","RyClora *** mL every 6 hours as needed","Karbinal ER *** mL every 12 hours as needed","Singulair (montelukast) ***mg daily","Flonase (fluticasone) one spray per nostril daily","Flonase (fluticasone) two sprays per nostril daily","Flonase (fluticasone) one spray per nostril on Mon/Wed/Fri","Xhance (fluticasone) 1-2 sprays per nostril daily","Xhance (fluticasone) 1-2 sprays per nostril twice daily","Nasacort (triamcinolone) one spray per nostril daily","Nasacort (triamcinolone) two sprays per nostril daily","Nasonex (momentason) one spray per nostril daily","Nasonex (mometasone) two sprays per nostril daily","Astelin (azelastine) 2 sprays per nostril 1-2 times daily as needed","Patanase (olopatadine) two sprays per nostril 1-2 times daily as needed","Dymista (fluticasone/azelastine) two sprays per nostril 1-2 times daily as needed","Pataday (olopatadine) one drop per eye twice daily as needed","Pazeo (olopatadine) one drop per eye daily as needed","Bepreve (bepotastine) one drop per eye twice daily as needed)"} ? ?COPD: FEV1 today was ?Continue Symbicort 2 puffs twice daily ?Continue Combivent as needed ?Continue pulmonary rehab  ?Continue to follow-up with pulmonary for further discussions of lung volume reduction therapy, oxygen therapy ?

## 2021-08-31 ENCOUNTER — Encounter (HOSPITAL_COMMUNITY)
Admission: RE | Admit: 2021-08-31 | Discharge: 2021-08-31 | Disposition: A | Discharge status: Home / Self Care | Payer: No Typology Code available for payment source | Source: Ambulatory Visit | Attending: Pathology | Admitting: Pathology

## 2021-08-31 VITALS — Wt 176.8 lb

## 2021-08-31 DIAGNOSIS — J449 Chronic obstructive pulmonary disease, unspecified: Secondary | ICD-10-CM | POA: Diagnosis not present

## 2021-08-31 NOTE — Progress Notes (Signed)
Daily Session Note ? ?Patient Details  ?Name: Gerald Williams ?MRN: [id number] ?Date of Birth: July 08, 1950 ?Referring Provider:   ?Flowsheet Row PULMONARY REHAB COPD ORIENTATION from 07/05/2021 in [place]  ?Referring Provider Dr. Spero Curb  ? ?  ? ? ?Encounter Date: 08/31/2021 ? ?Check In: ? Session Check In - 08/31/21 1045   ? ?  ? Check-In  ? Supervising physician immediately available to respond to emergencies [facility] MD immediately available   ? Physician(s) Dr. Domenic Polite   ? Location [facility]   ? Staff Present Hoy Register, MS, ACSM-CEP, Exercise Physiologist;Heather Zigmund Daniel, Exercise Physiologist   ? Virtual Visit No   ? Medication changes reported     No   ? Fall or balance concerns reported    No   ? Tobacco Cessation No Change   ? Warm-up and Cool-down Performed as group-led instruction   ? Resistance Training Performed Yes   ? VAD Patient? No   ? PAD/SET Patient? No   ?  ? Pain Assessment  ? Currently in Pain? No/denies   ? Multiple Pain Sites No   ? ?  ?  ? ?  ? ? ?Capillary Blood Glucose: ?No results found for this or any previous visit (from the past 24 hour(s)). ? ? ? ?Social History  ? ?Tobacco Use  ?Smoking Status Former  ? Packs/day: 1.50  ? Years: 50.00  ? Pack years: 75.00  ? Types: Cigarettes  ? Start date: 04/26/1963  ? Quit date: 09/22/2013  ? Years since quitting: 7.9  ?Smokeless Tobacco Never  ?Tobacco Comments  ? patient using the patch, losenges, and medication  ? ? ?Goals Met:  ?Independence with exercise equipment ?Exercise tolerated well ?No report of concerns or symptoms today ?Strength training completed today ? ?Goals Unmet:  ?Not Applicable ? ?Comments: checkout time is 1145 ? ? ?Dr. Kathie Dike is Medical Director for [facility] Pulmonary Re[facility]. ?

## 2021-09-02 ENCOUNTER — Encounter (HOSPITAL_COMMUNITY)
Admission: RE | Admit: 2021-09-02 | Discharge: 2021-09-02 | Disposition: A | Discharge status: Home / Self Care | Payer: No Typology Code available for payment source | Source: Ambulatory Visit | Attending: Pathology | Admitting: Pathology

## 2021-09-02 DIAGNOSIS — J449 Chronic obstructive pulmonary disease, unspecified: Secondary | ICD-10-CM | POA: Diagnosis not present

## 2021-09-02 NOTE — Progress Notes (Signed)
Daily Session Note ? ?Patient Details  ?Name: Gerald Williams ?MRN: [id number] ?Date of Birth: 1950/10/31 ?Referring Provider:   ?Flowsheet Row PULMONARY REHAB COPD ORIENTATION from 07/05/2021 in [place]  ?Referring Provider Dr. Spero Curb  ? ?  ? ? ?Encounter Date: 09/02/2021 ? ?Check In: ? Session Check In - 09/02/21 1044   ? ?  ? Check-In  ? Supervising physician immediately available to respond to emergencies [facility] MD immediately available   ? Physician(s) Dr Marlou Porch   ? Location [facility]   ? Staff Present Madelyn Flavors, RN, BSN;Heather Otho Ket, BS, Exercise Physiologist   ? Virtual Visit No   ? Medication changes reported     No   ? Fall or balance concerns reported    No   ? Comments He has fallen once this year due to his right leg "giving out". He also has diabetic neuropathy that alters his balance as well.   ? Tobacco Cessation No Change   ? Warm-up and Cool-down Performed as group-led instruction   ? Resistance Training Performed Yes   ? VAD Patient? No   ? PAD/SET Patient? No   ?  ? Pain Assessment  ? Currently in Pain? No/denies   ? Multiple Pain Sites No   ? ?  ?  ? ?  ? ? ?Capillary Blood Glucose: ?No results found for this or any previous visit (from the past 24 hour(s)). ? ? ? ?Social History  ? ?Tobacco Use  ?Smoking Status Former  ? Packs/day: 1.50  ? Years: 50.00  ? Pack years: 75.00  ? Types: Cigarettes  ? Start date: 04/26/1963  ? Quit date: 09/22/2013  ? Years since quitting: 7.9  ?Smokeless Tobacco Never  ?Tobacco Comments  ? patient using the patch, losenges, and medication  ? ? ?Goals Met:  ?Independence with exercise equipment ?Exercise tolerated well ?No report of concerns or symptoms today ?Strength training completed today ? ?Goals Unmet:  ?Not Applicable ? ?Comments: Checkout at 1145. ? ? ?Dr. Kathie Dike is Medical Director for [facility] Pulmonary Rehab. ?

## 2021-09-07 ENCOUNTER — Encounter (HOSPITAL_COMMUNITY): Payer: No Typology Code available for payment source

## 2021-09-09 ENCOUNTER — Encounter (HOSPITAL_COMMUNITY)
Admission: RE | Admit: 2021-09-09 | Discharge: 2021-09-09 | Disposition: A | Discharge status: Home / Self Care | Payer: No Typology Code available for payment source | Source: Ambulatory Visit | Attending: Pathology | Admitting: Pathology

## 2021-09-09 DIAGNOSIS — J449 Chronic obstructive pulmonary disease, unspecified: Secondary | ICD-10-CM

## 2021-09-09 NOTE — Progress Notes (Signed)
Daily Session Note  Patient Details  Name: Gerald Williams MRN: [id number] Date of Birth: Aug 24, 1950 Referring Provider:   Flowsheet Row PULMONARY REHAB COPD ORIENTATION from 07/05/2021 in [place]  Referring Provider Dr. Spero Curb       Encounter Date: 09/09/2021  Check In:  Session Check In - 09/09/21 1045       Check-In   Supervising physician immediately available to respond to emergencies [facility] MD immediately available    Physician(s) Dr. Harrington Challenger    Location [facility]    Staff Present Redge Gainer, BS, Exercise Physiologist;Sherrie Marsan, Kermit Balo, RN, Bjorn Loser, MS, ACSM-CEP, Exercise Physiologist    Virtual Visit No    Medication changes reported     No    Fall or balance concerns reported    Yes    Comments He has fallen once this year due to his right leg "giving out". He also has diabetic neuropathy that alters his balance as well.    Tobacco Cessation No Change    Warm-up and Cool-down Performed as group-led instruction    Resistance Training Performed Yes    VAD Patient? No    PAD/SET Patient? No      Pain Assessment   Currently in Pain? No/denies    Multiple Pain Sites No             Capillary Blood Glucose: No results found for this or any previous visit (from the past 24 hour(s)).    Social History   Tobacco Use  Smoking Status Former   Packs/day: 1.50   Years: 50.00   Pack years: 75.00   Types: Cigarettes   Start date: 04/26/1963   Quit date: 09/22/2013   Years since quitting: 7.9  Smokeless Tobacco Never  Tobacco Comments   patient using the patch, losenges, and medication    Goals Met:  Proper associated with RPD/PD & O2 Sat Independence with exercise equipment Exercise tolerated well No report of concerns or symptoms today Strength training completed today  Goals Unmet:  Not Applicable  Comments: check out @ 11:45am   Dr. Carlyle Dolly is Medical Director for  [place]

## 2021-09-14 ENCOUNTER — Encounter (HOSPITAL_COMMUNITY)
Admission: RE | Admit: 2021-09-14 | Discharge: 2021-09-14 | Disposition: A | Discharge status: Home / Self Care | Payer: No Typology Code available for payment source | Source: Ambulatory Visit | Attending: Pathology | Admitting: Pathology

## 2021-09-14 VITALS — Wt 176.1 lb

## 2021-09-14 DIAGNOSIS — J449 Chronic obstructive pulmonary disease, unspecified: Secondary | ICD-10-CM | POA: Diagnosis not present

## 2021-09-14 NOTE — Progress Notes (Signed)
Daily Session Note  Patient Details  Name: Gerald Williams MRN: [id number] Date of Birth: 08-19-1950 Referring Provider:   Flowsheet Row PULMONARY REHAB COPD ORIENTATION from 07/05/2021 in [place]  Referring Provider Dr. Spero Curb       Encounter Date: 09/14/2021  Check In:  Session Check In - 09/14/21 1045       Check-In   Supervising physician immediately available to respond to emergencies [facility] MD immediately available    Physician(s) Dr. Domenic Polite    Location [facility]    Staff Present Geanie Cooley, RN;Heather Otho Ket, BS, Exercise Physiologist;Anagha Loseke Wynetta Emery, RN, Bjorn Loser, MS, ACSM-CEP, Exercise Physiologist    Virtual Visit No    Fall or balance concerns reported    Yes    Comments He has fallen once this year due to his right leg "giving out". He also has diabetic neuropathy that alters his balance as well.    Tobacco Cessation No Change    Warm-up and Cool-down Performed as group-led instruction    Resistance Training Performed Yes    VAD Patient? No    PAD/SET Patient? No      Pain Assessment   Currently in Pain? No/denies    Multiple Pain Sites No             Capillary Blood Glucose: No results found for this or any previous visit (from the past 24 hour(s)).    Social History   Tobacco Use  Smoking Status Former   Packs/day: 1.50   Years: 50.00   Pack years: 75.00   Types: Cigarettes   Start date: 04/26/1963   Quit date: 09/22/2013   Years since quitting: 7.9  Smokeless Tobacco Never  Tobacco Comments   patient using the patch, losenges, and medication    Goals Met:  Proper associated with RPD/PD & O2 Sat Independence with exercise equipment Improved SOB with ADL's Using PLB without cueing & demonstrates good technique Exercise tolerated well No report of concerns or symptoms today Strength training completed today  Goals Unmet:  Not Applicable  Comments: Check out  1145.   Dr. Kathie Dike is Medical Director for [facility] Pulmonary Rehab.

## 2021-09-15 NOTE — Progress Notes (Signed)
Pulmonary Individual Treatment Plan  Patient Details  Name: Gerald Williams MRN: [id number] Date of Birth: 06/07/1950 Referring Provider:   Flowsheet Row PULMONARY REHAB COPD ORIENTATION from 07/05/2021 in [facility] CARDIAC REHABILITATION  Referring Provider Dr. Maryellen Pile       Initial Encounter Date:  Flowsheet Row PULMONARY REHAB COPD ORIENTATION from 07/05/2021 in [place] [facility]  Date 07/05/21       Visit Diagnosis: Chronic obstructive pulmonary disease, unspecified COPD type (HCC)  Patient's Home Medications on Admission:   Current Outpatient Medications:    acetaminophen (TYLENOL) 500 MG tablet, Take 1,000 mg by mouth every 6 (six) hours as needed for mild pain, fever or headache., Disp: , Rfl:    albuterol (PROVENTIL) (2.5 MG/3ML) 0.083% nebulizer solution, Take 2.5 mg by nebulization every 6 (six) hours as needed for wheezing or shortness of breath., Disp: , Rfl:    atorvastatin (LIPITOR) 80 MG tablet, Take 80 mg by mouth daily., Disp: , Rfl:    diclofenac Sodium (VOLTAREN) 1 % GEL, Apply 2 g topically 4 (four) times daily., Disp: 150 g, Rfl: 1   DULoxetine (CYMBALTA) 60 MG capsule, TAKE ONE CAPSULE BY MOUTH TWO TIMES A DAY FOR POSTTRAUMATIC STRESS DISORDER AND DEPRESSION, Disp: , Rfl:    fluticasone (FLONASE) 50 MCG/ACT nasal spray, INSTILL 2 SPRAYS INTO EACH NOSTRIL EVERY DAY MAXIMUM 2 SPRAYS IN EACH NOSTRIL DAILY. FOR ALLERGIES, Disp: , Rfl:    furosemide (LASIX) 20 MG tablet, Take 1 tablet (20 mg total) by mouth daily., Disp: 90 tablet, Rfl: 1   glucose blood (COOL BLOOD GLUCOSE TEST STRIPS) test strip, Used to test blood sugar x2 daily---diagnosis code r73.03--for one touch verio flex, Disp: 200 each, Rfl: 3   guaiFENesin (MUCINEX) 600 MG 12 hr tablet, Take 600 mg by mouth 2 (two) times daily as needed., Disp: , Rfl:    ipratropium (ATROVENT) 0.03 % nasal spray, Place 1 spray into both nostrils every 8 (eight) hours as needed for rhinitis., Disp: 30 mL,  Rfl: 5   Ipratropium-Albuterol (COMBIVENT RESPIMAT) 20-100 MCG/ACT AERS respimat, Inhale 1 puff into the lungs every 6 (six) hours., Disp: 4 g, Rfl: 6   Lancets MISC, Used to test blood sugar x2 daily---diagnosis code r73.03--for one touch verio flex, Disp: 200 each, Rfl: 3   losartan (COZAAR) 50 MG tablet, Take 1 tablet (50 mg total) by mouth daily., Disp: 90 tablet, Rfl: 1   metoprolol succinate (TOPROL-XL) 50 MG 24 hr tablet, Take 1 tablet (50 mg total) by mouth daily. Take with or immediately following a meal., Disp: 90 tablet, Rfl: 1   mirtazapine (REMERON) 30 MG tablet, Take 30 mg by mouth at bedtime., Disp: , Rfl:    OXYGEN, Inhale 4-5 L into the lungs continuous., Disp: , Rfl:    SYMBICORT 160-4.5 MCG/ACT inhaler, Inhale 2 puffs into the lungs daily., Disp: , Rfl:    tamsulosin (FLOMAX) 0.4 MG CAPS capsule, Take 0.4 mg by mouth daily., Disp: , Rfl:   Past Medical History: Past Medical History:  Diagnosis Date   CHF (congestive heart failure) (HCC)    Emphysema    Erectile dysfunction    Hyperlipemia    Hypertension    Polysubstance abuse (HCC)    stopped using crack/cocaine December 4th 20-14   PTSD (post-traumatic stress disorder)    Sleep apnea     Tobacco Use: Social History   Tobacco Use  Smoking Status Former   Packs/day: 1.50   Years: 50.00   Pack years: 75.00  Types: Cigarettes   Start date: 04/26/1963   Quit date: 09/22/2013   Years since quitting: 7.9  Smokeless Tobacco Never  Tobacco Comments   patient using the patch, losenges, and medication    Labs: Review Flowsheet        Latest Ref Rng & Units 01/01/2016 05/07/2019 10/29/2020 10/30/2020  Labs for ITP Cardiac and Pulmonary Rehab  Cholestrol 0 - 200 mg/dL  161   096     LDL (calc) 0 - 99 mg/dL  045   409     HDL-C >81 mg/dL  59   79     Trlycerides <150 mg/dL  84   92     Hemoglobin A1c 4.8 - 5.6 % 6.2   6.1      PH, Arterial 7.350 - 7.450      PCO2 arterial 32.0 - 48.0 mmHg      Bicarbonate 20.0 -  28.0 mmol/L    30.1     30.5    TCO2 22 - 32 mmol/L    32     32    O2 Saturation %    98.0     64.0       10/31/2020  Labs for ITP Cardiac and Pulmonary Rehab  Cholestrol   LDL (calc)   HDL-C   Trlycerides   Hemoglobin A1c   PH, Arterial 7.371    PCO2 arterial 50.2    Bicarbonate 28.3    TCO2   O2 Saturation 91.8        Multiple values from one day are sorted in reverse-chronological order        Capillary Blood Glucose: Lab Results  Component Value Date   GLUCAP 145 (H) 10/30/2020   GLUCAP 97 10/24/2018   GLUCAP 95 07/16/2014   GLUCAP 87 07/16/2014   GLUCAP 89 07/15/2014     Pulmonary Assessment Scores:  Pulmonary Assessment Scores     Row Name 07/05/21 1353         ADL UCSD   ADL Phase Entry     SOB Score total 70     Rest 4     Walk 3     Stairs 2     Bath 3     Dress 3     Shop 3       CAT Score   CAT Score 24       mMRC Score   mMRC Score 3             UCSD: Self-administered rating of dyspnea associated with activities of daily living (ADLs) 6-point scale (0 = "not at all" to 5 = "maximal or unable to do because of breathlessness")  Scoring Scores range from 0 to 120.  Minimally important difference is 5 units  CAT: CAT can identify the health impairment of COPD patients and is better correlated with disease progression.  CAT has a scoring range of zero to 40. The CAT score is classified into four groups of low (less than 10), medium (10 - 20), high (21-30) and very high (31-40) based on the impact level of disease on health status. A CAT score over 10 suggests significant symptoms.  A worsening CAT score could be explained by an exacerbation, poor medication adherence, poor inhaler technique, or progression of COPD or comorbid conditions.  CAT MCID is 2 points  mMRC: mMRC (Modified Medical Research Council) Dyspnea Scale is used to assess the degree of baseline functional disability in patients of respiratory disease  due to  dyspnea. No minimal important difference is established. A decrease in score of 1 point or greater is considered a positive change.   Pulmonary Function Assessment:   Exercise Target Goals: Exercise Program Goal: Individual exercise prescription set using results from initial 6 min walk test and THRR while considering  patient's activity barriers and safety.   Exercise Prescription Goal: Initial exercise prescription builds to 30-45 minutes a day of aerobic activity, 2-3 days per week.  Home exercise guidelines will be given to patient during program as part of exercise prescription that the participant will acknowledge.  Activity Barriers & Risk Stratification:  Activity Barriers & Cardiac Risk Stratification - 07/05/21 1420       Activity Barriers & Cardiac Risk Stratification   Activity Barriers Deconditioning;Shortness of Breath    Cardiac Risk Stratification High             6 Minute Walk:  6 Minute Walk     Row Name 07/05/21 1404         6 Minute Walk   Phase Initial     Distance 925 feet     Walk Time 6 minutes     # of Rest Breaks 1     MPH 1.75     METS 2.54     RPE 17     Perceived Dyspnea  13     VO2 Peak 8.9     Symptoms Yes (comment)     Comments 1 standing rest break for 30 sec due to SOB     Resting HR 84 bpm     Resting BP 144/88     Resting Oxygen Saturation  100 %     Exercise Oxygen Saturation  during 6 min walk 93 %     Max Ex. HR 113 bpm     Max Ex. BP 150/98     2 Minute Post BP 140/98       Interval HR   1 Minute HR 93     2 Minute HR 95     3 Minute HR 100     4 Minute HR 107     5 Minute HR 113     6 Minute HR 110     2 Minute Post HR 97     Interval Heart Rate? Yes       Interval Oxygen   Interval Oxygen? Yes     Baseline Oxygen Saturation % 100 %     1 Minute Oxygen Saturation % 99 %     1 Minute Liters of Oxygen 4 L     2 Minute Oxygen Saturation % 95 %     2 Minute Liters of Oxygen 4 L     3 Minute Oxygen Saturation %  97 %     3 Minute Liters of Oxygen 4 L     4 Minute Oxygen Saturation % 96 %     4 Minute Liters of Oxygen 4 L     5 Minute Oxygen Saturation % 94 %     5 Minute Liters of Oxygen 4 L     6 Minute Oxygen Saturation % 93 %     6 Minute Liters of Oxygen 4 L     2 Minute Post Oxygen Saturation % 99 %     2 Minute Post Liters of Oxygen 4 L              Oxygen Initial Assessment:  Oxygen Initial Assessment -  07/05/21 1422       Home Oxygen   Home Oxygen Device E-Tanks;Home Concentrator    Sleep Oxygen Prescription CPAP    Liters per minute 0    Home Exercise Oxygen Prescription Continuous    Liters per minute 5    Home Resting Oxygen Prescription Continuous    Liters per minute 4    Compliance with Home Oxygen Use Yes      Initial 6 min Walk   Oxygen Used Continuous      Program Oxygen Prescription   Program Oxygen Prescription Continuous      Intervention   Short Term Goals To learn and demonstrate proper pursed lip breathing techniques or other breathing techniques. ;To learn and understand importance of monitoring SPO2 with pulse oximeter and demonstrate accurate use of the pulse oximeter.;To learn and exhibit compliance with exercise, home and travel O2 prescription;To learn and understand importance of maintaining oxygen saturations>88%    Long  Term Goals Exhibits compliance with exercise, home  and travel O2 prescription;Verbalizes importance of monitoring SPO2 with pulse oximeter and return demonstration;Maintenance of O2 saturations>88%;Exhibits proper breathing techniques, such as pursed lip breathing or other method taught during program session             Oxygen Re-Evaluation:  Oxygen Re-Evaluation     Row Name 07/20/21 1252 08/17/21 1253           Program Oxygen Prescription   Program Oxygen Prescription Continuous Continuous      Liters per minute 4 4        Home Oxygen   Home Oxygen Device E-Tanks;Home Concentrator E-Tanks;Home Concentrator       Sleep Oxygen Prescription CPAP CPAP      Liters per minute 0 0      Home Exercise Oxygen Prescription Continuous Continuous      Liters per minute 5 5      Home Resting Oxygen Prescription Continuous Continuous      Liters per minute 4 4      Compliance with Home Oxygen Use Yes Yes        Goals/Expected Outcomes   Short Term Goals To learn and demonstrate proper pursed lip breathing techniques or other breathing techniques. ;To learn and exhibit compliance with exercise, home and travel O2 prescription;To learn and understand importance of maintaining oxygen saturations>88%;To learn and understand importance of monitoring SPO2 with pulse oximeter and demonstrate accurate use of the pulse oximeter. To learn and demonstrate proper pursed lip breathing techniques or other breathing techniques. ;To learn and exhibit compliance with exercise, home and travel O2 prescription;To learn and understand importance of maintaining oxygen saturations>88%;To learn and understand importance of monitoring SPO2 with pulse oximeter and demonstrate accurate use of the pulse oximeter.      Long  Term Goals Exhibits compliance with exercise, home  and travel O2 prescription;Verbalizes importance of monitoring SPO2 with pulse oximeter and return demonstration;Maintenance of O2 saturations>88%;Exhibits proper breathing techniques, such as pursed lip breathing or other method taught during program session Exhibits compliance with exercise, home  and travel O2 prescription;Verbalizes importance of monitoring SPO2 with pulse oximeter and return demonstration;Maintenance of O2 saturations>88%;Exhibits proper breathing techniques, such as pursed lip breathing or other method taught during program session      Goals/Expected Outcomes compliance compliance               Oxygen Discharge (Final Oxygen Re-Evaluation):  Oxygen Re-Evaluation - 08/17/21 1253       Program Oxygen Prescription  Program Oxygen Prescription  Continuous    Liters per minute 4      Home Oxygen   Home Oxygen Device E-Tanks;Home Concentrator    Sleep Oxygen Prescription CPAP    Liters per minute 0    Home Exercise Oxygen Prescription Continuous    Liters per minute 5    Home Resting Oxygen Prescription Continuous    Liters per minute 4    Compliance with Home Oxygen Use Yes      Goals/Expected Outcomes   Short Term Goals To learn and demonstrate proper pursed lip breathing techniques or other breathing techniques. ;To learn and exhibit compliance with exercise, home and travel O2 prescription;To learn and understand importance of maintaining oxygen saturations>88%;To learn and understand importance of monitoring SPO2 with pulse oximeter and demonstrate accurate use of the pulse oximeter.    Long  Term Goals Exhibits compliance with exercise, home  and travel O2 prescription;Verbalizes importance of monitoring SPO2 with pulse oximeter and return demonstration;Maintenance of O2 saturations>88%;Exhibits proper breathing techniques, such as pursed lip breathing or other method taught during program session    Goals/Expected Outcomes compliance             Initial Exercise Prescription:  Initial Exercise Prescription - 07/05/21 1400       Date of Initial Exercise RX and Referring Provider   Date 07/05/21    Referring Provider Dr. Maryellen Pile    Expected Discharge Date 11/09/21      Oxygen   Oxygen Continuous    Liters 4    Maintain Oxygen Saturation 88% or higher      NuStep   Level 1    SPM 80    Minutes 22      Arm Ergometer   Level 1    RPM 60    Minutes 17      Prescription Details   Frequency (times per week) 2    Duration Progress to 30 minutes of continuous aerobic without signs/symptoms of physical distress      Intensity   THRR 40-80% of Max Heartrate 60-121    Ratings of Perceived Exertion 11-13    Perceived Dyspnea 0-4      Resistance Training   Training Prescription Yes    Weight 3    Reps  10-15             Perform Capillary Blood Glucose checks as needed.  Exercise Prescription Changes:   Exercise Prescription Changes     Row Name 07/20/21 1200 08/03/21 1200 08/17/21 1200 08/31/21 1200 09/14/21 1300     Response to Exercise   Blood Pressure (Admit) 132/80 152/83 138/70 126/70 130/68   Blood Pressure (Exercise) 140/78 132/74 146/70 168/80 160/60   Blood Pressure (Exit) 116/72 130/70 136/80 128/76 140/82   Heart Rate (Admit) 94 bpm 93 bpm 87 bpm 98 bpm 103 bpm   Heart Rate (Exercise) 102 bpm 101 bpm 100 bpm 105 bpm 107 bpm   Heart Rate (Exit) 83 bpm 92 bpm 87 bpm 99 bpm 95 bpm   Oxygen Saturation (Admit) 98 % 96 % 98 % 96 % 97 %   Oxygen Saturation (Exercise) 98 % 98 % 99 % 98 % 98 %   Oxygen Saturation (Exit) 100 % 100 % 100 % 100 % 99 %   Rating of Perceived Exertion (Exercise) 13 12 15 15 15    Perceived Dyspnea (Exercise) 13 12 12 13 15    Duration Continue with 30 min of aerobic exercise without signs/symptoms of physical  distress. Continue with 30 min of aerobic exercise without signs/symptoms of physical distress. Continue with 30 min of aerobic exercise without signs/symptoms of physical distress. Continue with 30 min of aerobic exercise without signs/symptoms of physical distress. Continue with 30 min of aerobic exercise without signs/symptoms of physical distress.   Intensity THRR unchanged THRR unchanged THRR unchanged THRR unchanged THRR unchanged     Progression   Progression Continue to progress workloads to maintain intensity without signs/symptoms of physical distress. Continue to progress workloads to maintain intensity without signs/symptoms of physical distress. Continue to progress workloads to maintain intensity without signs/symptoms of physical distress. Continue to progress workloads to maintain intensity without signs/symptoms of physical distress. Continue to progress workloads to maintain intensity without signs/symptoms of physical distress.      Resistance Training   Training Prescription Yes Yes Yes Yes Yes   Weight 2 3 3 2 2    Reps 10-15 10-15 10-15 10-15 10-15   Time 10 Minutes 10 Minutes 10 Minutes 10 Minutes 10 Minutes     Oxygen   Oxygen Continuous Continuous Continuous Continuous Continuous   Liters 3 6 6 6 6      NuStep   Level 1 2 3 3 3    SPM 71 82 72 69 76   Minutes 22 22 22 22 22    METs 1.8 1.9 1.9 1.9 1.9     Arm Ergometer   Level 1 1 3 3 3    RPM 60 60 57 59 58   Minutes 17 17 17 17 17    METs 2 1.8 3.1 3.3 3.1            Exercise Comments:   Exercise Goals and Review:   Exercise Goals     Row Name 07/05/21 1407 07/20/21 1249 08/17/21 1250 09/14/21 1314       Exercise Goals   Increase Physical Activity Yes Yes Yes Yes    Intervention Provide advice, education, support and counseling about physical activity/exercise needs.;Develop an individualized exercise prescription for aerobic and resistive training based on initial evaluation findings, risk stratification, comorbidities and participant's personal goals. Provide advice, education, support and counseling about physical activity/exercise needs.;Develop an individualized exercise prescription for aerobic and resistive training based on initial evaluation findings, risk stratification, comorbidities and participant's personal goals. Provide advice, education, support and counseling about physical activity/exercise needs.;Develop an individualized exercise prescription for aerobic and resistive training based on initial evaluation findings, risk stratification, comorbidities and participant's personal goals. Provide advice, education, support and counseling about physical activity/exercise needs.;Develop an individualized exercise prescription for aerobic and resistive training based on initial evaluation findings, risk stratification, comorbidities and participant's personal goals.    Expected Outcomes Short Term: Attend rehab on a regular basis to  increase amount of physical activity.;Long Term: Exercising regularly at least 3-5 days a week.;Long Term: Add in home exercise to make exercise part of routine and to increase amount of physical activity. Short Term: Attend rehab on a regular basis to increase amount of physical activity.;Long Term: Exercising regularly at least 3-5 days a week.;Long Term: Add in home exercise to make exercise part of routine and to increase amount of physical activity. Short Term: Attend rehab on a regular basis to increase amount of physical activity.;Long Term: Exercising regularly at least 3-5 days a week.;Long Term: Add in home exercise to make exercise part of routine and to increase amount of physical activity. Short Term: Attend rehab on a regular basis to increase amount of physical activity.;Long Term: Exercising regularly at  least 3-5 days a week.;Long Term: Add in home exercise to make exercise part of routine and to increase amount of physical activity.    Increase Strength and Stamina Yes -- Yes Yes    Intervention Provide advice, education, support and counseling about physical activity/exercise needs.;Develop an individualized exercise prescription for aerobic and resistive training based on initial evaluation findings, risk stratification, comorbidities and participant's personal goals. Provide advice, education, support and counseling about physical activity/exercise needs.;Develop an individualized exercise prescription for aerobic and resistive training based on initial evaluation findings, risk stratification, comorbidities and participant's personal goals. Provide advice, education, support and counseling about physical activity/exercise needs.;Develop an individualized exercise prescription for aerobic and resistive training based on initial evaluation findings, risk stratification, comorbidities and participant's personal goals. Provide advice, education, support and counseling about physical  activity/exercise needs.;Develop an individualized exercise prescription for aerobic and resistive training based on initial evaluation findings, risk stratification, comorbidities and participant's personal goals.    Expected Outcomes Short Term: Increase workloads from initial exercise prescription for resistance, speed, and METs.;Short Term: Perform resistance training exercises routinely during rehab and add in resistance training at home;Long Term: Improve cardiorespiratory fitness, muscular endurance and strength as measured by increased METs and functional capacity ( ) Short Term: Increase workloads from initial exercise prescription for resistance, speed, and METs.;Short Term: Perform resistance training exercises routinely during rehab and add in resistance training at home;Long Term: Improve cardiorespiratory fitness, muscular endurance and strength as measured by increased METs and functional capacity ( ) Short Term: Increase workloads from initial exercise prescription for resistance, speed, and METs.;Short Term: Perform resistance training exercises routinely during rehab and add in resistance training at home;Long Term: Improve cardiorespiratory fitness, muscular endurance and strength as measured by increased METs and functional capacity ( ) Short Term: Increase workloads from initial exercise prescription for resistance, speed, and METs.;Short Term: Perform resistance training exercises routinely during rehab and add in resistance training at home;Long Term: Improve cardiorespiratory fitness, muscular endurance and strength as measured by increased METs and functional capacity ( )    Able to understand and use rate of perceived exertion (RPE) scale Yes Yes Yes Yes    Intervention Provide education and explanation on how to use RPE scale Provide education and explanation on how to use RPE scale Provide education and explanation on how to use RPE scale Provide education and explanation  on how to use RPE scale    Expected Outcomes Short Term: Able to use RPE daily in rehab to express subjective intensity level;Long Term:  Able to use RPE to guide intensity level when exercising independently Short Term: Able to use RPE daily in rehab to express subjective intensity level;Long Term:  Able to use RPE to guide intensity level when exercising independently Short Term: Able to use RPE daily in rehab to express subjective intensity level;Long Term:  Able to use RPE to guide intensity level when exercising independently Short Term: Able to use RPE daily in rehab to express subjective intensity level;Long Term:  Able to use RPE to guide intensity level when exercising independently    Able to understand and use Dyspnea scale Yes Yes Yes Yes    Intervention Provide education and explanation on how to use Dyspnea scale Provide education and explanation on how to use Dyspnea scale Provide education and explanation on how to use Dyspnea scale Provide education and explanation on how to use Dyspnea scale    Expected Outcomes Short Term: Able to use Dyspnea scale daily in rehab to express subjective  sense of shortness of breath during exertion;Long Term: Able to use Dyspnea scale to guide intensity level when exercising independently Short Term: Able to use Dyspnea scale daily in rehab to express subjective sense of shortness of breath during exertion;Long Term: Able to use Dyspnea scale to guide intensity level when exercising independently Short Term: Able to use Dyspnea scale daily in rehab to express subjective sense of shortness of breath during exertion;Long Term: Able to use Dyspnea scale to guide intensity level when exercising independently Short Term: Able to use Dyspnea scale daily in rehab to express subjective sense of shortness of breath during exertion;Long Term: Able to use Dyspnea scale to guide intensity level when exercising independently    Knowledge and understanding of Target Heart  Rate Range (THRR) Yes Yes Yes Yes    Intervention Provide education and explanation of THRR including how the numbers were predicted and where they are located for reference Provide education and explanation of THRR including how the numbers were predicted and where they are located for reference Provide education and explanation of THRR including how the numbers were predicted and where they are located for reference Provide education and explanation of THRR including how the numbers were predicted and where they are located for reference    Expected Outcomes Short Term: Able to state/look up THRR;Long Term: Able to use THRR to govern intensity when exercising independently;Short Term: Able to use daily as guideline for intensity in rehab Short Term: Able to state/look up THRR;Long Term: Able to use THRR to govern intensity when exercising independently;Short Term: Able to use daily as guideline for intensity in rehab Short Term: Able to state/look up THRR;Long Term: Able to use THRR to govern intensity when exercising independently;Short Term: Able to use daily as guideline for intensity in rehab Short Term: Able to state/look up THRR;Long Term: Able to use THRR to govern intensity when exercising independently;Short Term: Able to use daily as guideline for intensity in rehab    Understanding of Exercise Prescription Yes Yes Yes Yes    Intervention Provide education, explanation, and written materials on patient's individual exercise prescription Provide education, explanation, and written materials on patient's individual exercise prescription Provide education, explanation, and written materials on patient's individual exercise prescription Provide education, explanation, and written materials on patient's individual exercise prescription    Expected Outcomes Short Term: Able to explain program exercise prescription;Long Term: Able to explain home exercise prescription to exercise independently Short Term:  Able to explain program exercise prescription;Long Term: Able to explain home exercise prescription to exercise independently Short Term: Able to explain program exercise prescription;Long Term: Able to explain home exercise prescription to exercise independently Short Term: Able to explain program exercise prescription;Long Term: Able to explain home exercise prescription to exercise independently             Exercise Goals Re-Evaluation :  Exercise Goals Re-Evaluation     Row Name 07/20/21 1249 08/17/21 1250 09/14/21 1314         Exercise Goal Re-Evaluation   Exercise Goals Review Increase Physical Activity;Increase Strength and Stamina;Able to understand and use rate of perceived exertion (RPE) scale;Able to understand and use Dyspnea scale;Knowledge and understanding of Target Heart Rate Range (THRR);Understanding of Exercise Prescription Increase Physical Activity;Increase Strength and Stamina;Able to understand and use rate of perceived exertion (RPE) scale;Able to understand and use Dyspnea scale;Knowledge and understanding of Target Heart Rate Range (THRR);Understanding of Exercise Prescription Increase Physical Activity;Increase Strength and Stamina;Able to understand and use rate of  perceived exertion (RPE) scale;Able to understand and use Dyspnea scale;Knowledge and understanding of Target Heart Rate Range (THRR);Able to check pulse independently;Understanding of Exercise Prescription     Comments Pt has completed 4 sesisons of pulmonary rehab. Pt is deconditioned ans will progress slowly. He is motivated to decrease his use of oxygen but on his "bad" days and when he feels SOB he will increase his oxygen from 4L to 6L. He is currently exercising at 2.0 METs on the AE. Will continue to monitor and progress as able. Pt has completed 10 seesions of pulmonary rehab. Pt is increasing his workload and is motivated to decrease his oxygen. His is still increasing his oxygen to 6L and using  inhaler during exercise. He is currently exercising at 3.1 METs on the AE. Will continue to monitor and progress as able. Pt has completed 16 sessions of pulmonary rehab. Pt is slowly starting to increase his workload but does not push himself. He still increases his oxygen to 6 liters while in class when his chart says 4 liters. He is currently exercising at 3.1 METs on the AE. Will continue to monitor and progress as able.     Expected Outcomes Through exercise at rehab and at home, the patient will meet their stated goals. Through exercise at rehab and at home, the patient will meet their stated goals. Through exercise at rehab and at home, the patient will meet their stated goals.              Discharge Exercise Prescription (Final Exercise Prescription Changes):  Exercise Prescription Changes - 09/14/21 1300       Response to Exercise   Blood Pressure (Admit) 130/68    Blood Pressure (Exercise) 160/60    Blood Pressure (Exit) 140/82    Heart Rate (Admit) 103 bpm    Heart Rate (Exercise) 107 bpm    Heart Rate (Exit) 95 bpm    Oxygen Saturation (Admit) 97 %    Oxygen Saturation (Exercise) 98 %    Oxygen Saturation (Exit) 99 %    Rating of Perceived Exertion (Exercise) 15    Perceived Dyspnea (Exercise) 15    Duration Continue with 30 min of aerobic exercise without signs/symptoms of physical distress.    Intensity THRR unchanged      Progression   Progression Continue to progress workloads to maintain intensity without signs/symptoms of physical distress.      Resistance Training   Training Prescription Yes    Weight 2    Reps 10-15    Time 10 Minutes      Oxygen   Oxygen Continuous    Liters 6      NuStep   Level 3    SPM 76    Minutes 22    METs 1.9      Arm Ergometer   Level 3    RPM 58    Minutes 17    METs 3.1             Nutrition:  Target Goals: Understanding of nutrition guidelines, daily intake of sodium 1500mg , cholesterol 200mg , calories 30%  from fat and 7% or less from saturated fats, daily to have 5 or more servings of fruits and vegetables.  Biometrics:  Pre Biometrics - 07/05/21 1408       Pre Biometrics   Height 5' 6.5" (1.689 m)    Weight 80.2 kg    Waist Circumference 44 inches    Hip Circumference 42.5 inches  Waist to Hip Ratio 1.04 %    BMI (Calculated) 28.11    Triceps Skinfold 20 mm    % Body Fat 31 %    Grip Strength 37.5 kg    Flexibility 4.5 in    Single Leg Stand 2.72 seconds              Nutrition Therapy Plan and Nutrition Goals:  Nutrition Therapy & Goals - 08/10/21 1411       Personal Nutrition Goals   Comments We offer 2 educational sessions regarding heart healthy nutrition with handouts and assistance with RD referral if patient is interested.  Handout provided and explained regarding healthier choices and assesment discussed.  Patient verbalized understanding.      Intervention Plan   Intervention Nutrition handout(s) given to patient.    Expected Outcomes Short Term Goal: Understand basic principles of dietary content, such as calories, fat, sodium, cholesterol and nutrients.             Nutrition Assessments:  Nutrition Assessments - 07/05/21 1347       MEDFICTS Scores   Pre Score 57            MEDIFICTS Score Key: ?70 Need to make dietary changes  40-70 Heart Healthy Diet ? 40 Therapeutic Level Cholesterol Diet   Picture Your Plate Scores: <96 Unhealthy dietary pattern with much room for improvement. 41-50 Dietary pattern unlikely to meet recommendations for good health and room for improvement. 51-60 More healthful dietary pattern, with some room for improvement.  >60 Healthy dietary pattern, although there may be some specific behaviors that could be improved.    Nutrition Goals Re-Evaluation:   Nutrition Goals Discharge (Final Nutrition Goals Re-Evaluation):   Psychosocial: Target Goals: Acknowledge presence or absence of significant depression  and/or stress, maximize coping skills, provide positive support system. Participant is able to verbalize types and ability to use techniques and skills needed for reducing stress and depression.  Initial Review & Psychosocial Screening:  Initial Psych Review & Screening - 07/05/21 1405       Initial Review   Current issues with Current Sleep Concerns   PTSD     Family Dynamics   Good Support System? Yes      Barriers   Psychosocial barriers to participate in program There are no identifiable barriers or psychosocial needs.   No psychosocial barriers identified but does have psychosocial needs.     Screening Interventions   Interventions Encouraged to exercise    Expected Outcomes Short Term goal: Identification and review with participant of any Quality of Life or Depression concerns found by scoring the questionnaire.;Long Term goal: The participant improves quality of Life and PHQ9 Scores as seen by post scores and/or verbalization of changes             Quality of Life Scores:  Quality of Life - 07/05/21 1403       Quality of Life   Select Quality of Life      Quality of Life Scores   Health/Function Pre 18.9 %    Socioeconomic Pre 19.08 %    Psych/Spiritual Pre 29.14 %    Family Pre 14.25 %    GLOBAL Pre 20.59 %            Scores of 19 and below usually indicate a poorer quality of life in these areas.  A difference of  2-3 points is a clinically meaningful difference.  A difference of 2-3 points in the total score  of the Quality of Life Index has been associated with significant improvement in overall quality of life, self-image, physical symptoms, and general health in studies assessing change in quality of life.   PHQ-9: Review Flowsheet        07/05/2021 03/04/2021 10/23/2017 07/13/2017 05/23/2017  Depression screen PHQ 2/9  Decreased Interest 1 0 0 0 0  Down, Depressed, Hopeless 1 2 0 1 0  PHQ - 2 Score 2 2 0 1 0  Altered sleeping Tired, decreased  energy 2 3  0   Change in appetite 1 2  0   Feeling bad or failure about yourself  0 0  0   Trouble concentrating 1 0  0   Moving slowly or fidgety/restless 0 1  0   Suicidal thoughts 0 0  0   PHQ-9 Score Difficult doing work/chores Somewhat difficult Somewhat difficult  Not difficult at all          Interpretation of Total Score  Total Score Depression Severity:  1-4 = Minimal depression, 5-9 = Mild depression, 10-14 = Moderate depression, 15-19 = Moderately severe depression, 20-27 = Severe depression   Psychosocial Evaluation and Intervention:  Psychosocial Evaluation - 07/05/21 1408       Psychosocial Evaluation & Interventions   Interventions Encouraged to exercise with the program and follow exercise prescription    Comments Patient has no psychosocial barriers identified. 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 His PTSD is managed with Duloxetine 30 mg and Mirtazepine 30 mg and he sees a psychriatist at the [facility] monthly. He says he also talks to his pastor a lot about his emotional needs. He feels his PTSD is managed at this time but is an ongoing issue for him. He says he still has frequent bad dreams about [place] and this along with wearing a CPAP often disrupts his sleep but he is able to cope. He has 3 daughters, 2 that he is close with and multiple grandchildren and 3 sisters. He says all of his family support him and would help him with anything he needed. His initial PHQ-9 score was 8 related mainly to his SOB limiting his activity. He is ready to start the program hoping to get his lungs stronger enabling him to have lung reduction surgery.    Expected Outcomes Patient will continue to have no psychosocial barriers identified and his PTSD will continue to be managed.    Continue Psychosocial Services  No Follow up required             Psychosocial Re-Evaluation:  Psychosocial Re-Evaluation     Row Name 07/12/21 1439  08/10/21 1403 09/06/21 0756         Psychosocial Re-Evaluation   Current issues with Current Psychotropic Meds;Current Stress Concerns Current Psychotropic Meds;Current Stress Concerns Current Psychotropic Meds;Current Stress Concerns     Comments Patient is new to program (starting over) completing 1 sessions.  His initial QOL score is 19.58 and his PHQ-9 score is 8.  He seems to enjoy coming to program and interacts well with class and staff.  He was referred to pulmonary rehab via Dr. Maryellen Pile with COPD.  Patient has a histroy of  PTSD and he on psychotropic meds, also is managed  with a consoler.   We will continue to monitor and  he will continue to have no psychosocial issuses. Patient is completing  8 sessions.   He seems to enjoy coming to program and interacts well with class and staff.  He was referred to pulmonary rehab via Dr. Maryellen Pile with COPD.  Patient has a histroy of  PTSD and he on psychotropic meds, also is managed  with a consoler. He demonstrate a positvie outlook.  We will continue to monitor as he progresses and he will continue to have no psychosocial issuses. Patient has completed 15 sessions.   He seems to enjoy coming to program and interacts well with class and staff.   Patient has a histroy of  PTSD and he on psychotropic meds, which is  managed  with medication. He demonstrate a positvie outlook.  We will continue to monitor as he progresses and he will continue to have no psychosocial issuses.     Expected Outcomes Patient will have no psychosocial barriers identified at discharge. Patient will have no psychosocial barriers identified at discharge. Patient will have no psychosocial barriers identified at discharge.     Interventions Stress management education;Relaxation education;Encouraged to attend Pulmonary Rehabilitation for the exercise Stress management education;Relaxation education;Encouraged to attend Pulmonary Rehabilitation for the exercise Stress management  education;Relaxation education;Encouraged to attend Pulmonary Rehabilitation for the exercise     Continue Psychosocial Services  No Follow up required No Follow up required No Follow up required       Initial Review   Source of Stress Concerns -- -- None Identified              Psychosocial Discharge (Final Psychosocial Re-Evaluation):  Psychosocial Re-Evaluation - 09/06/21 0756       Psychosocial Re-Evaluation   Current issues with Current Psychotropic Meds;Current Stress Concerns    Comments Patient has completed 15 sessions.   He seems to enjoy coming to program and interacts well with class and staff.   Patient has a histroy of  PTSD and he on psychotropic meds, which is  managed  with medication. He demonstrate a positvie outlook.  We will continue to monitor as he progresses and he will continue to have no psychosocial issuses.    Expected Outcomes Patient will have no psychosocial barriers identified at discharge.    Interventions Stress management education;Relaxation education;Encouraged to attend Pulmonary Rehabilitation for the exercise    Continue Psychosocial Services  No Follow up required      Initial Review   Source of Stress Concerns None Identified              Education: Education Goals: Education classes will be provided on a weekly basis, covering required topics. Participant will state understanding/return demonstration of topics presented.  Learning Barriers/Preferences:   Education Topics: How Lungs Work and Diseases: - Discuss the anatomy of the lungs and diseases that can affect the lungs, such as COPD.   Exercise: -Discuss the importance of exercise, FITT principles of exercise, normal and abnormal responses to exercise, and how to exercise safely.   Environmental Irritants: -Discuss types of environmental irritants and how to limit exposure to environmental irritants. Flowsheet Row PULMONARY REHAB CHRONIC OBSTRUCTIVE PULMONARY DISEASE  from 09/09/2021 in [facility]  Date 08/05/21  Educator hj  Instruction Review Code 1- Verbalizes Understanding       Meds/Inhalers and oxygen: - Discuss respiratory medications, definition of an inhaler and oxygen, and the proper way to use an inhaler and oxygen. Flowsheet Row PULMONARY REHAB CHRONIC OBSTRUCTIVE PULMONARY DISEASE from 09/09/2021 in [facility]  Date 08/12/21  Educator hj  Energy Saving Techniques: - Discuss methods to conserve energy and decrease shortness of breath when performing activities of daily living.  Flowsheet Row PULMONARY REHAB CHRONIC OBSTRUCTIVE PULMONARY DISEASE from 09/09/2021 in [place] [facility]  Date 08/19/21  Educator HJ       Bronchial Hygiene / Breathing Techniques: - Discuss breathing mechanics, pursed-lip breathing technique,  proper posture, effective ways to clear airways, and other functional breathing techniques   Cleaning Equipment: - Provides group verbal and written instruction about the health risks of elevated stress, cause of high stress, and healthy ways to reduce stress. Flowsheet Row PULMONARY REHAB CHRONIC OBSTRUCTIVE PULMONARY DISEASE from 09/09/2021 in [place]  Date 09/02/21  Educator HJ  Instruction Review Code 1- Verbalizes Understanding       Nutrition I: Fats: - Discuss the types of cholesterol, what cholesterol does to the body, and how cholesterol levels can be controlled. Flowsheet Row PULMONARY REHAB CHRONIC OBSTRUCTIVE PULMONARY DISEASE from 09/09/2021 in [facility]  Date 09/09/21  Educator pb  Instruction Review Code 1- Verbalizes Understanding       Nutrition II: Labels: -Discuss the different components of food labels and how to read food labels.   Respiratory Infections: - Discuss the signs and symptoms of respiratory infections, ways to prevent respiratory infections, and the  importance of seeking medical treatment when having a respiratory infection. Flowsheet Row PULMONARY REHAB CHRONIC OBSTRUCTIVE PULMONARY DISEASE from 03/25/2021 in [facility]  Date 03/25/21  Educator DF  Instruction Review Code 2- Demonstrated Understanding       Stress I: Signs and Symptoms: - Discuss the causes of stress, how stress may lead to anxiety and depression, and ways to limit stress.   Stress II: Relaxation: -Discuss relaxation techniques to limit stress. Flowsheet Row PULMONARY REHAB CHRONIC OBSTRUCTIVE PULMONARY DISEASE from 09/09/2021 in [facility]  Date 07/08/21  Educator HJ  Instruction Review Code 1- Verbalizes Understanding       Oxygen for Home/Travel: - Discuss how to prepare for travel when on oxygen and proper ways to transport and store oxygen to ensure safety.   Knowledge Questionnaire Score:  Knowledge Questionnaire Score - 07/05/21 1349       Knowledge Questionnaire Score   Pre Score 12/18             Core Components/Risk Factors/Patient Goals at Admission:  Personal Goals and Risk Factors at Admission - 07/05/21 1350       Core Components/Risk Factors/Patient Goals on Admission    Weight Management Weight Maintenance    Improve shortness of breath with ADL's Yes    Intervention Provide education, individualized exercise plan and daily activity instruction to help decrease symptoms of SOB with activities of daily living.    Expected Outcomes Short Term: Improve cardiorespiratory fitness to achieve a reduction of symptoms when performing ADLs;Long Term: Be able to perform more ADLs without symptoms or delay the onset of symptoms    Personal Goal Other Yes    Personal Goal Patient wants to strength his lungs getting ready to have lung reduction surgery. He would like to improve his breathing overall and lower oxygen liter flow to 3 to 4 L/M.    Intervention Paitent will attend the PR program with  exercise and education and supplement with exercise at home.    Expected Outcomes Patient complete the program meeting both personal and program goals.             Core Components/Risk  Factors/Patient Goals Review:   Goals and Risk Factor Review     Row Name 07/12/21 1454 08/10/21 1414 09/06/21 0804         Core Components/Risk Factors/Patient Goals Review   Personal Goals Review Improve shortness of breath with ADL's;Increase knowledge of respiratory medications and ability to use respiratory devices properly.;Develop more efficient breathing techniques such as purse lipped breathing and diaphragmatic breathing and practicing self-pacing with activity. Improve shortness of breath with ADL's;Increase knowledge of respiratory medications and ability to use respiratory devices properly.;Develop more efficient breathing techniques such as purse lipped breathing and diaphragmatic breathing and practicing self-pacing with activity. Improve shortness of breath with ADL's;Increase knowledge of respiratory medications and ability to use respiratory devices properly.;Develop more efficient breathing techniques such as purse lipped breathing and diaphragmatic breathing and practicing self-pacing with activity.     Review Patient is new to program attending 1 sessions (also he is starting over again). He is referred to PR by Dr. Maryellen Pile with COPD .  His personal goals for the program are to get stronger, improve SOB with activity, breath better and get lungs stronger for lung reduction surgery.   We will continue to monitor his progress as he works toward meeting these goals.  O2 Sat during exercise averages at 100% with O2  in progress at 6L via nasal cannul.  We will encouage him to decrease  liters of oxygen while he works toward meeting his goals. Patient has completed 8 sessions. Maintiaining his weight at 80.5kg.  He continues to use Oxygen at 6 L via nasal cannule.  He as been instructed several time  to try and decrease his oxygen level. O2 sat average during exercise at 97%-100% .  We will encouage him to decrease  liters of oxygen while he works toward meeting his goals.  His personal goals for the program are to get stronger, improve SOB with activity, breath better and get lungs stronger for lung reduction surgery.   We will continue to monitor his progress as he works toward meeting these goals. We will encouage him to decrease  liters of oxygen while he works toward meeting his goals. Patient has completed 15 sessions. Maintiaining his weight at 80.2kg.  He continues to use Oxygen at 6 L via nasal cannule during class sessions and tolerates well.  He as been instructed several time to try and decrease his oxygen level. O2 Sats average during exercise at 97%-100% on 4-5 liters .  We will continue to encouage him to decrease  liters of oxygen while he works toward meeting his goals.  His personal goals for the program are to get stronger, improve SOB with activity, breath better and get lungs stronger for lung reduction surgery.   We will continue to monitor his progress as he works toward meeting these goals. We will encouage him to decrease  liters of oxygen while he works toward meeting his goals.     Expected Outcomes Patient will complete the PR meeting both program and personal goals. Patient will complete the PR meeting both program and personal goals. Patient will complete the PR meeting both program and personal goals.              Core Components/Risk Factors/Patient Goals at Discharge (Final Review):   Goals and Risk Factor Review - 09/06/21 0804       Core Components/Risk Factors/Patient Goals Review   Personal Goals Review Improve shortness of breath with ADL's;Increase knowledge of respiratory medications and ability to  use respiratory devices properly.;Develop more efficient breathing techniques such as purse lipped breathing and diaphragmatic breathing and practicing self-pacing  with activity.    Review Patient has completed 15 sessions. Maintiaining his weight at 80.2kg.  He continues to use Oxygen at 6 L via nasal cannule during class sessions and tolerates well.  He as been instructed several time to try and decrease his oxygen level. O2 Sats average during exercise at 97%-100% on 4-5 liters .  We will continue to encouage him to decrease  liters of oxygen while he works toward meeting his goals.  His personal goals for the program are to get stronger, improve SOB with activity, breath better and get lungs stronger for lung reduction surgery.   We will continue to monitor his progress as he works toward meeting these goals. We will encouage him to decrease  liters of oxygen while he works toward meeting his goals.    Expected Outcomes Patient will complete the PR meeting both program and personal goals.             ITP Comments:   Comments: ITP REVIEW Pt is making expected progress toward pulmonary rehab goals after completing 17 sessions. Recommend continued exercise, life style modification, education, and utilization of breathing techniques to increase stamina and strength and decrease shortness of breath with exertion.

## 2021-09-16 ENCOUNTER — Encounter (HOSPITAL_COMMUNITY)
Admission: RE | Admit: 2021-09-16 | Discharge: 2021-09-16 | Disposition: A | Discharge status: Home / Self Care | Payer: No Typology Code available for payment source | Source: Ambulatory Visit | Attending: Pathology | Admitting: Pathology

## 2021-09-16 DIAGNOSIS — J449 Chronic obstructive pulmonary disease, unspecified: Secondary | ICD-10-CM | POA: Diagnosis not present

## 2021-09-16 NOTE — Progress Notes (Signed)
I have reviewed a Home Exercise Prescription with [name] . Gerald Williams is currently exercising at home.  The patient was advised to walk, use the arm ergometer, and the stepper 1-3 days a week for 30-45 minutes.  [name] and I discussed how to progress their exercise prescription.  The patient stated that their goals were to come off of oxygen and to get the zephyr valve surgery.  The patient stated that they understand the exercise prescription.  We reviewed exercise guidelines, target heart rate during exercise, RPE Scale, weather conditions, NTG use, endpoints for exercise, warmup and cool down.  Patient is encouraged to come to me with any questions. I will continue to follow up with the patient to assist them with progression and safety.    Hoy Register, M.S., ACSM CEP

## 2021-09-16 NOTE — Progress Notes (Signed)
Daily Session Note  Patient Details  Name: Gerald Williams MRN: [id number] Date of Birth: 10/01/1950 Referring Provider:   Flowsheet Row PULMONARY REHAB COPD ORIENTATION from 07/05/2021 in [place] [facility]  Referring Provider Dr. Shelburne       Encounter Date: 09/16/2021  Check In:  Session Check In - 09/16/21 1045       Check-In   Supervising physician immediately available to respond to emergencies [facility] MD immediately available    Physician(s) Dr Pemberton    Location [facility]    Staff Present Daphyne Martin, RN, BSN;Dalton Fletcher, MS, ACSM-CEP, Exercise Physiologist;Heather Jachimiak, BS, Exercise Physiologist    Virtual Visit No    Medication changes reported     No    Fall or balance concerns reported    Yes    Comments He has fallen once this year due to his right leg "giving out". He also has diabetic neuropathy that alters his balance as well.    Tobacco Cessation No Change    Warm-up and Cool-down Performed as group-led instruction    Resistance Training Performed Yes    VAD Patient? No    PAD/SET Patient? No      Pain Assessment   Currently in Pain? No/denies    Multiple Pain Sites No             Capillary Blood Glucose: No results found for this or any previous visit (from the past 24 hour(s)).    Social History   Tobacco Use  Smoking Status Former   Packs/day: 1.50   Years: 50.00   Pack years: 75.00   Types: Cigarettes   Start date: 04/26/1963   Quit date: 09/22/2013   Years since quitting: 7.9  Smokeless Tobacco Never  Tobacco Comments   patient using the patch, losenges, and medication    Goals Met:  Independence with exercise equipment Exercise tolerated well No report of concerns or symptoms today Strength training completed today  Goals Unmet:  Not Applicable  Comments: Checkout at 1145.   Dr. Jehanzeb Memon is Medical Director for [facility]. 

## 2021-09-21 ENCOUNTER — Encounter (HOSPITAL_COMMUNITY)
Admission: RE | Admit: 2021-09-21 | Discharge: 2021-09-21 | Disposition: A | Discharge status: Home / Self Care | Payer: No Typology Code available for payment source | Source: Ambulatory Visit | Attending: Pathology | Admitting: Pathology

## 2021-09-21 DIAGNOSIS — J449 Chronic obstructive pulmonary disease, unspecified: Secondary | ICD-10-CM

## 2021-09-21 NOTE — Progress Notes (Signed)
Daily Session Note  Patient Details  Name: Gerald Williams MRN: [id number] Date of Birth: Mar 07, 1951 Referring Provider:   Flowsheet Row PULMONARY REHAB COPD ORIENTATION from 07/05/2021 in [place]  Referring Provider Dr. Spero Curb       Encounter Date: 09/21/2021  Check In:  Session Check In - 09/21/21 1044       Check-In   Supervising physician immediately available to respond to emergencies [facility] MD immediately available    Physician(s) Dr Harl Bowie    Location [facility]    Staff Present Leandra Vanderweele Hassell Done, RN, Bjorn Loser, MS, ACSM-CEP, Exercise Physiologist;Heather Zigmund Daniel, Exercise Physiologist    Virtual Visit No    Medication changes reported     No    Fall or balance concerns reported    No    Tobacco Cessation No Change    Warm-up and Cool-down Performed as group-led instruction    Resistance Training Performed Yes    VAD Patient? No    PAD/SET Patient? No      Pain Assessment   Currently in Pain? No/denies    Multiple Pain Sites No             Capillary Blood Glucose: No results found for this or any previous visit (from the past 24 hour(s)).    Social History   Tobacco Use  Smoking Status Former   Packs/day: 1.50   Years: 50.00   Pack years: 75.00   Types: Cigarettes   Start date: 04/26/1963   Quit date: 09/22/2013   Years since quitting: 8.0  Smokeless Tobacco Never  Tobacco Comments   patient using the patch, losenges, and medication    Goals Met:  Independence with exercise equipment Exercise tolerated well No report of concerns or symptoms today Strength training completed today  Goals Unmet:  Not Applicable  Comments: Checkout at 1145.   Dr. Kathie Dike is Medical Director for [facility].

## 2021-09-23 ENCOUNTER — Encounter (HOSPITAL_COMMUNITY): Payer: No Typology Code available for payment source

## 2021-09-28 ENCOUNTER — Encounter (HOSPITAL_COMMUNITY)
Admission: RE | Admit: 2021-09-28 | Discharge: 2021-09-28 | Disposition: A | Discharge status: Home / Self Care | Payer: No Typology Code available for payment source | Source: Ambulatory Visit | Attending: Pathology | Admitting: Pathology

## 2021-09-28 VITALS — Wt 178.1 lb

## 2021-09-28 DIAGNOSIS — J449 Chronic obstructive pulmonary disease, unspecified: Secondary | ICD-10-CM | POA: Diagnosis present

## 2021-09-28 NOTE — Progress Notes (Signed)
Daily Session Note  Patient Details  Name: Gerald Williams MRN: [id number] Date of Birth: 1950/09/25 Referring Provider:   Flowsheet Row PULMONARY REHAB COPD ORIENTATION from 07/05/2021 in [place]  Referring Provider Dr. Spero Curb       Encounter Date: 09/28/2021  Check In:  Session Check In - 09/28/21 1044       Check-In   Supervising physician immediately available to respond to emergencies [facility] MD immediately available    Physician(s) Dr Johnsie Cancel    Location [facility]    Staff Present Kristelle Cavallaro Hassell Done, RN, Bjorn Loser, MS, ACSM-CEP, Exercise Physiologist;Heather Zigmund Daniel, Exercise Physiologist    Virtual Visit No    Medication changes reported     No    Fall or balance concerns reported    No    Tobacco Cessation No Change    Warm-up and Cool-down Performed as group-led instruction    Resistance Training Performed Yes    VAD Patient? No    PAD/SET Patient? No      Pain Assessment   Currently in Pain? No/denies    Multiple Pain Sites No             Capillary Blood Glucose: No results found for this or any previous visit (from the past 24 hour(s)).    Social History   Tobacco Use  Smoking Status Former   Packs/day: 1.50   Years: 50.00   Pack years: 75.00   Types: Cigarettes   Start date: 04/26/1963   Quit date: 09/22/2013   Years since quitting: 8.0  Smokeless Tobacco Never  Tobacco Comments   patient using the patch, losenges, and medication    Goals Met:  Independence with exercise equipment Exercise tolerated well No report of concerns or symptoms today Strength training completed today  Goals Unmet:  Not Applicable  Comments: Checkout at 1145.   Dr. Kathie Dike is Medical Director for [facility].

## 2021-09-30 ENCOUNTER — Encounter (HOSPITAL_COMMUNITY): Payer: No Typology Code available for payment source

## 2021-10-05 ENCOUNTER — Encounter (HOSPITAL_COMMUNITY)
Admission: RE | Admit: 2021-10-05 | Discharge: 2021-10-05 | Disposition: A | Discharge status: Home / Self Care | Payer: No Typology Code available for payment source | Source: Ambulatory Visit | Attending: Pathology | Admitting: Pathology

## 2021-10-05 DIAGNOSIS — J449 Chronic obstructive pulmonary disease, unspecified: Secondary | ICD-10-CM

## 2021-10-05 NOTE — Progress Notes (Signed)
Daily Session Note  Patient Details  Name: Gerald Williams MRN: [id number] Date of Birth: 08/13/1950 Referring Provider:   Flowsheet Row PULMONARY REHAB COPD ORIENTATION from 07/05/2021 in [place] [facility]  Referring Provider Dr. Shelburne       Encounter Date: 10/05/2021  Check In:  Session Check In - 10/05/21 1045       Check-In   Supervising physician immediately available to respond to emergencies [facility] immediately available    Physician(s) Dr [name]    Location [facility]    Staff Present Daphyne Martin, RN, BSN;Dalton Fletcher, MS, ACSM-CEP, Exercise Physiologist;Heather Jachimiak, BS, Exercise Physiologist    Virtual Visit No    Medication changes reported     No    Fall or balance concerns reported    No    Comments He has fallen once this year due to his right leg "giving out". He also has diabetic neuropathy that alters his balance as well.    Tobacco Cessation No Change    Warm-up and Cool-down Performed as group-led instruction    Resistance Training Performed Yes    VAD Patient? No    PAD/SET Patient? No      Pain Assessment   Currently in Pain? No/denies    Multiple Pain Sites No             Capillary Blood Glucose: No results found for this or any previous visit (from the past 24 hour(s)).    Social History   Tobacco Use  Smoking Status Former   Packs/day: 1.50   Years: 50.00   Total pack years: 75.00   Types: Cigarettes   Start date: 04/26/1963   Quit date: 09/22/2013   Years since quitting: 8.0  Smokeless Tobacco Never  Tobacco Comments   patient using the patch, losenges, and medication    Goals Met:  Independence with exercise equipment Exercise tolerated well No report of concerns or symptoms today Strength training completed today  Goals Unmet:  Not Applicable  Comments: Checkout at 1145.   Dr. Jehanzeb Memon is Medical Director for [facility]. 

## 2021-10-07 ENCOUNTER — Encounter (HOSPITAL_COMMUNITY)
Admission: RE | Admit: 2021-10-07 | Discharge: 2021-10-07 | Disposition: A | Discharge status: Home / Self Care | Payer: No Typology Code available for payment source | Source: Ambulatory Visit | Attending: Pathology | Admitting: Pathology

## 2021-10-07 DIAGNOSIS — J449 Chronic obstructive pulmonary disease, unspecified: Secondary | ICD-10-CM

## 2021-10-07 NOTE — Progress Notes (Signed)
Daily Session Note  Patient Details  Name: Gerald Williams MRN: [id number] Date of Birth: March 07, 1951 Referring Provider:   Flowsheet Row PULMONARY REHAB COPD ORIENTATION from 07/05/2021 in [place]  Referring Provider Dr. Spero Curb       Encounter Date: 10/07/2021  Check In:  Session Check In - 10/07/21 1045       Check-In   Supervising physician immediately available to respond to emergencies [facility] immediately available    Physician(s) Dr Debara Pickett    Location [facility]    Staff Present Lewanna Petrak Hassell Done, RN, Bjorn Loser, MS, ACSM-CEP, Exercise Physiologist;Heather Zigmund Daniel, Exercise Physiologist    Virtual Visit No    Medication changes reported     No    Fall or balance concerns reported    No    Comments He has fallen once this year due to his right leg "giving out". He also has diabetic neuropathy that alters his balance as well.    Tobacco Cessation No Change    Warm-up and Cool-down Performed as group-led instruction    Resistance Training Performed Yes    VAD Patient? No    PAD/SET Patient? No      Pain Assessment   Currently in Pain? No/denies    Multiple Pain Sites No             Capillary Blood Glucose: No results found for this or any previous visit (from the past 24 hour(s)).    Social History   Tobacco Use  Smoking Status Former   Packs/day: 1.50   Years: 50.00   Total pack years: 75.00   Types: Cigarettes   Start date: 04/26/1963   Quit date: 09/22/2013   Years since quitting: 8.0  Smokeless Tobacco Never  Tobacco Comments   patient using the patch, losenges, and medication    Goals Met:  Independence with exercise equipment Exercise tolerated well No report of concerns or symptoms today Strength training completed today  Goals Unmet:  Not Applicable  Comments: Checkout at 1145.   Dr. Kathie Dike is Medical Director for [facility].

## 2021-10-08 NOTE — Progress Notes (Deleted)
Pulmonary Individual Treatment Plan  Patient Details  Name: Gerald Williams MRN: [id number] Date of Birth: [birth year] Referring Provider:   Flowsheet Row PULMONARY REHAB COPD ORIENTATION from 07/05/2021 in [facility]  Referring Provider Dr. Spero Curb       Initial Encounter Date:  Flowsheet Row PULMONARY REHAB COPD ORIENTATION from 07/05/2021 in [facility]  Date 07/05/21       Visit Diagnosis: Chronic obstructive pulmonary disease, unspecified COPD type ([place])  Patient's Home Medications on Admission:   Current Outpatient Medications:    acetaminophen (TYLENOL) 500 MG tablet, Take 1,000 mg by mouth every 6 (six) hours as needed for mild pain, fever or headache., Disp: , Rfl:    albuterol (PROVENTIL) (2.5 MG/3ML) 0.083% nebulizer solution, Take 2.5 mg by nebulization every 6 (six) hours as needed for wheezing or shortness of breath., Disp: , Rfl:    atorvastatin (LIPITOR) 80 MG tablet, Take 80 mg by mouth daily., Disp: , Rfl:    diclofenac Sodium (VOLTAREN) 1 % GEL, Apply 2 g topically 4 (four) times daily., Disp: 150 g, Rfl: 1   DULoxetine (CYMBALTA) 60 MG capsule, TAKE ONE CAPSULE BY MOUTH TWO TIMES A DAY FOR POSTTRAUMATIC STRESS DISORDER AND DEPRESSION, Disp: , Rfl:    fluticasone (FLONASE) 50 MCG/ACT nasal spray, INSTILL 2 SPRAYS INTO EACH NOSTRIL EVERY DAY MAXIMUM 2 SPRAYS IN EACH NOSTRIL DAILY. FOR ALLERGIES, Disp: , Rfl:    furosemide (LASIX) 20 MG tablet, Take 1 tablet (20 mg total) by mouth daily., Disp: 90 tablet, Rfl: 1   glucose blood (COOL BLOOD GLUCOSE TEST STRIPS) test strip, Used to test blood sugar x2 daily---diagnosis code r73.03--for one touch verio flex, Disp: 200 each, Rfl: 3   guaiFENesin (MUCINEX) 600 MG 12 hr tablet, Take 600 mg by mouth 2 (two) times daily as needed., Disp: , Rfl:    ipratropium (ATROVENT) 0.03 % nasal spray, Place 1 spray into both nostrils every 8 (eight) hours as needed for rhinitis., Disp: 30 mL,  Rfl: 5   Ipratropium-Albuterol (COMBIVENT RESPIMAT) 20-100 MCG/ACT AERS respimat, Inhale 1 puff into the lungs every 6 (six) hours., Disp: 4 g, Rfl: 6   Lancets MISC, Used to test blood sugar x2 daily---diagnosis code r73.03--for one touch verio flex, Disp: 200 each, Rfl: 3   losartan (COZAAR) 50 MG tablet, Take 1 tablet (50 mg total) by mouth daily., Disp: 90 tablet, Rfl: 1   metoprolol succinate (TOPROL-XL) 50 MG 24 hr tablet, Take 1 tablet (50 mg total) by mouth daily. Take with or immediately following a meal., Disp: 90 tablet, Rfl: 1   mirtazapine (REMERON) 30 MG tablet, Take 30 mg by mouth at bedtime., Disp: , Rfl:    OXYGEN, Inhale 4-5 L into the lungs continuous., Disp: , Rfl:    SYMBICORT 160-4.5 MCG/ACT inhaler, Inhale 2 puffs into the lungs daily., Disp: , Rfl:    tamsulosin (FLOMAX) 0.4 MG CAPS capsule, Take 0.4 mg by mouth daily., Disp: , Rfl:   Past Medical History: Past Medical History:  Diagnosis Date   CHF (congestive heart failure) ([place])    Emphysema    Erectile dysfunction    Hyperlipemia    Hypertension    Polysubstance abuse ([place])    stopped using crack/cocaine December 4th 20-14   PTSD (post-traumatic stress disorder)    Sleep apnea     Tobacco Use: Social History   Tobacco Use  Smoking Status Former   Packs/day: 1.50   Years: 50.00   Total pack years:  75.00   Types: Cigarettes   Start date: 04/26/1963   Quit date: 09/22/2013   Years since quitting: 8.0  Smokeless Tobacco Never  Tobacco Comments   patient using the patch, losenges, and medication    Labs: Review Flowsheet  More data exists      Latest Ref Rng & Units 01/01/2016 05/07/2019 10/29/2020 10/30/2020  Labs for ITP Cardiac and Pulmonary Rehab  Cholestrol 0 - 200 mg/dL - 218  244  -  LDL (calc) 0 - 99 mg/dL - 142  147  -  HDL-C >40 mg/dL - 59  79  -  Trlycerides <150 mg/dL - 84  92  -  Hemoglobin A1c 4.8 - 5.6 % 6.2  6.1  - -  PH, Arterial 7.350 - 7.450 - - - -  PCO2 arterial 32.0 - 48.0 mmHg -  - - -  Bicarbonate 20.0 - 28.0 mmol/L - - - 30.1  30.5   TCO2 22 - 32 mmol/L 22 - 32 mmol/L - - - 32  32   O2 Saturation % - - - 98.0  64.0       10/31/2020  Labs for ITP Cardiac and Pulmonary Rehab  Cholestrol -  LDL (calc) -  HDL-C -  Trlycerides -  Hemoglobin A1c -  PH, Arterial 7.371   PCO2 arterial 50.2   Bicarbonate 28.3   TCO2 -  O2 Saturation 91.8     Capillary Blood Glucose: Lab Results  Component Value Date   GLUCAP 145 (H) 10/30/2020   GLUCAP 97 10/24/2018   GLUCAP 95 07/16/2014   GLUCAP 87 07/16/2014   GLUCAP 89 07/15/2014     Pulmonary Assessment Scores:  Pulmonary Assessment Scores     Row Name 07/05/21 1353         ADL UCSD   ADL Phase Entry     SOB Score total 70     Rest 4     Walk 3     Stairs 2     Bath 3     Dress 3     Shop 3       CAT Score   CAT Score 24       mMRC Score   mMRC Score 3             UCSD: Self-administered rating of dyspnea associated with activities of daily living (ADLs) 6-point scale (0 = "not at all" to 5 = "maximal or unable to do because of breathlessness")  Scoring Scores range from 0 to 120.  Minimally important difference is 5 units  CAT: CAT can identify the health impairment of COPD patients and is better correlated with disease progression.  CAT has a scoring range of zero to 40. The CAT score is classified into four groups of low (less than 10), medium (10 - 20), high (21-30) and very high (31-40) based on the impact level of disease on health status. A CAT score over 10 suggests significant symptoms.  A worsening CAT score could be explained by an exacerbation, poor medication adherence, poor inhaler technique, or progression of COPD or comorbid conditions.  CAT MCID is 2 points  mMRC: mMRC (Modified Medical Research Council) Dyspnea Scale is used to assess the degree of baseline functional disability in patients of respiratory disease due to dyspnea. No minimal important difference is  established. A decrease in score of 1 point or greater is considered a positive change.   Pulmonary Function Assessment:   Exercise Target Goals: Exercise  Program Goal: Individual exercise prescription set using results from initial 6 min walk test and THRR while considering  patient's activity barriers and safety.   Exercise Prescription Goal: Initial exercise prescription builds to 30-45 minutes a day of aerobic activity, 2-3 days per week.  Home exercise guidelines will be given to patient during program as part of exercise prescription that the participant will acknowledge.  Activity Barriers & Risk Stratification:  Activity Barriers & Cardiac Risk Stratification - 07/05/21 1420       Activity Barriers & Cardiac Risk Stratification   Activity Barriers Deconditioning;Shortness of Breath    Cardiac Risk Stratification High             6 Minute Walk:  6 Minute Walk     Row Name 07/05/21 1404         6 Minute Walk   Phase Initial     Distance 925 feet     Walk Time 6 minutes     # of Rest Breaks 1     MPH 1.75     METS 2.54     RPE 17     Perceived Dyspnea  13     VO2 Peak 8.9     Symptoms Yes (comment)     Comments 1 standing rest break for 30 sec due to SOB     Resting HR 84 bpm     Resting BP 144/88     Resting Oxygen Saturation  100 %     Exercise Oxygen Saturation  during 6 min walk 93 %     Max Ex. HR 113 bpm     Max Ex. BP 150/98     2 Minute Post BP 140/98       Interval HR   1 Minute HR 93     2 Minute HR 95     3 Minute HR 100     4 Minute HR 107     5 Minute HR 113     6 Minute HR 110     2 Minute Post HR 97     Interval Heart Rate? Yes       Interval Oxygen   Interval Oxygen? Yes     Baseline Oxygen Saturation % 100 %     1 Minute Oxygen Saturation % 99 %     1 Minute Liters of Oxygen 4 L     2 Minute Oxygen Saturation % 95 %     2 Minute Liters of Oxygen 4 L     3 Minute Oxygen Saturation % 97 %     3 Minute Liters of Oxygen 4 L      4 Minute Oxygen Saturation % 96 %     4 Minute Liters of Oxygen 4 L     5 Minute Oxygen Saturation % 94 %     5 Minute Liters of Oxygen 4 L     6 Minute Oxygen Saturation % 93 %     6 Minute Liters of Oxygen 4 L     2 Minute Post Oxygen Saturation % 99 %     2 Minute Post Liters of Oxygen 4 L              Oxygen Initial Assessment:  Oxygen Initial Assessment - 07/05/21 1422       Home Oxygen   Home Oxygen Device E-Tanks;Home Concentrator    Sleep Oxygen Prescription CPAP    Liters per minute 0  Home Exercise Oxygen Prescription Continuous    Liters per minute 5    Home Resting Oxygen Prescription Continuous    Liters per minute 4    Compliance with Home Oxygen Use Yes      Initial 6 min Walk   Oxygen Used Continuous      Program Oxygen Prescription   Program Oxygen Prescription Continuous      Intervention   Short Term Goals To learn and demonstrate proper pursed lip breathing techniques or other breathing techniques. ;To learn and understand importance of monitoring SPO2 with pulse oximeter and demonstrate accurate use of the pulse oximeter.;To learn and exhibit compliance with exercise, home and travel O2 prescription;To learn and understand importance of maintaining oxygen saturations>88%    Long  Term Goals Exhibits compliance with exercise, home  and travel O2 prescription;Verbalizes importance of monitoring SPO2 with pulse oximeter and return demonstration;Maintenance of O2 saturations>88%;Exhibits proper breathing techniques, such as pursed lip breathing or other method taught during program session             Oxygen Re-Evaluation:  Oxygen Re-Evaluation     Row Name 07/20/21 1252 08/17/21 1253           Program Oxygen Prescription   Program Oxygen Prescription Continuous Continuous      Liters per minute 4 4        Home Oxygen   Home Oxygen Device E-Tanks;Home Concentrator E-Tanks;Home Concentrator      Sleep Oxygen Prescription CPAP CPAP       Liters per minute 0 0      Home Exercise Oxygen Prescription Continuous Continuous      Liters per minute 5 5      Home Resting Oxygen Prescription Continuous Continuous      Liters per minute 4 4      Compliance with Home Oxygen Use Yes Yes        Goals/Expected Outcomes   Short Term Goals To learn and demonstrate proper pursed lip breathing techniques or other breathing techniques. ;To learn and exhibit compliance with exercise, home and travel O2 prescription;To learn and understand importance of maintaining oxygen saturations>88%;To learn and understand importance of monitoring SPO2 with pulse oximeter and demonstrate accurate use of the pulse oximeter. To learn and demonstrate proper pursed lip breathing techniques or other breathing techniques. ;To learn and exhibit compliance with exercise, home and travel O2 prescription;To learn and understand importance of maintaining oxygen saturations>88%;To learn and understand importance of monitoring SPO2 with pulse oximeter and demonstrate accurate use of the pulse oximeter.      Long  Term Goals Exhibits compliance with exercise, home  and travel O2 prescription;Verbalizes importance of monitoring SPO2 with pulse oximeter and return demonstration;Maintenance of O2 saturations>88%;Exhibits proper breathing techniques, such as pursed lip breathing or other method taught during program session Exhibits compliance with exercise, home  and travel O2 prescription;Verbalizes importance of monitoring SPO2 with pulse oximeter and return demonstration;Maintenance of O2 saturations>88%;Exhibits proper breathing techniques, such as pursed lip breathing or other method taught during program session      Goals/Expected Outcomes compliance compliance               Oxygen Discharge (Final Oxygen Re-Evaluation):  Oxygen Re-Evaluation - 08/17/21 1253       Program Oxygen Prescription   Program Oxygen Prescription Continuous    Liters per minute 4      Home  Oxygen   Home Oxygen Device E-Tanks;Home Concentrator    Sleep Oxygen Prescription CPAP  Liters per minute 0    Home Exercise Oxygen Prescription Continuous    Liters per minute 5    Home Resting Oxygen Prescription Continuous    Liters per minute 4    Compliance with Home Oxygen Use Yes      Goals/Expected Outcomes   Short Term Goals To learn and demonstrate proper pursed lip breathing techniques or other breathing techniques. ;To learn and exhibit compliance with exercise, home and travel O2 prescription;To learn and understand importance of maintaining oxygen saturations>88%;To learn and understand importance of monitoring SPO2 with pulse oximeter and demonstrate accurate use of the pulse oximeter.    Long  Term Goals Exhibits compliance with exercise, home  and travel O2 prescription;Verbalizes importance of monitoring SPO2 with pulse oximeter and return demonstration;Maintenance of O2 saturations>88%;Exhibits proper breathing techniques, such as pursed lip breathing or other method taught during program session    Goals/Expected Outcomes compliance             Initial Exercise Prescription:  Initial Exercise Prescription - 07/05/21 1400       Date of Initial Exercise RX and Referring Provider   Date 07/05/21    Referring Provider Dr. Maryellen Pile    Expected Discharge Date 11/09/21      Oxygen   Oxygen Continuous    Liters 4    Maintain Oxygen Saturation 88% or higher      NuStep   Level 1    SPM 80    Minutes 22      Arm Ergometer   Level 1    RPM 60    Minutes 17      Prescription Details   Frequency (times per week) 2    Duration Progress to 30 minutes of continuous aerobic without signs/symptoms of physical distress      Intensity   THRR 40-80% of Max Heartrate 60-121    Ratings of Perceived Exertion 11-13    Perceived Dyspnea 0-4      Resistance Training   Training Prescription Yes    Weight 3    Reps 10-15             Perform Capillary Blood  Glucose checks as needed.  Exercise Prescription Changes:   Exercise Prescription Changes     Row Name 07/20/21 1200 08/03/21 1200 08/17/21 1200 08/31/21 1200 09/14/21 1300     Response to Exercise   Blood Pressure (Admit) 132/80 152/83 138/70 126/70 130/68   Blood Pressure (Exercise) 140/78 132/74 146/70 168/80 160/60   Blood Pressure (Exit) 116/72 130/70 136/80 128/76 140/82   Heart Rate (Admit) 94 bpm 93 bpm 87 bpm 98 bpm 103 bpm   Heart Rate (Exercise) 102 bpm 101 bpm 100 bpm 105 bpm 107 bpm   Heart Rate (Exit) 83 bpm 92 bpm 87 bpm 99 bpm 95 bpm   Oxygen Saturation (Admit) 98 % 96 % 98 % 96 % 97 %   Oxygen Saturation (Exercise) 98 % 98 % 99 % 98 % 98 %   Oxygen Saturation (Exit) 100 % 100 % 100 % 100 % 99 %   Rating of Perceived Exertion (Exercise) Perceived Dyspnea (Exercise) Duration Continue with 30 min of aerobic exercise without signs/symptoms of physical distress. Continue with 30 min of aerobic exercise without signs/symptoms of physical distress. Continue with 30 min of aerobic exercise without signs/symptoms of physical distress. Continue with 30 min of aerobic exercise without signs/symptoms of physical  distress. Continue with 30 min of aerobic exercise without signs/symptoms of physical distress.   Intensity THRR unchanged THRR unchanged THRR unchanged THRR unchanged THRR unchanged     Progression   Progression Continue to progress workloads to maintain intensity without signs/symptoms of physical distress. Continue to progress workloads to maintain intensity without signs/symptoms of physical distress. Continue to progress workloads to maintain intensity without signs/symptoms of physical distress. Continue to progress workloads to maintain intensity without signs/symptoms of physical distress. Continue to progress workloads to maintain intensity without signs/symptoms of physical distress.     Resistance Training   Training Prescription  Yes Yes Yes Yes Yes   Weight 2 3 3 2 2    Reps 10-15 10-15 10-15 10-15 10-15   Time 10 Minutes 10 Minutes 10 Minutes 10 Minutes 10 Minutes     Oxygen   Oxygen Continuous Continuous Continuous Continuous Continuous   Liters 3 6 6 6 6      NuStep   Level 1 2 3 3 3    SPM 71 82 72 69 76   Minutes 22 22 22 22 22    METs 1.8 1.9 1.9 1.9 1.9     Arm Ergometer   Level 1 1 3 3 3    RPM 60 60 57 59 58   Minutes 17 17 17 17 17    METs 2 1.8 3.1 3.3 3.1    Row Name 09/16/21 1100 09/28/21 1400           Response to Exercise   Blood Pressure (Admit) -- 122/76      Blood Pressure (Exercise) -- 134/80      Blood Pressure (Exit) -- 130/82      Heart Rate (Admit) -- 90 bpm      Heart Rate (Exercise) -- 96 bpm      Heart Rate (Exit) -- 98 bpm      Oxygen Saturation (Admit) -- 98 %      Oxygen Saturation (Exercise) -- 98 %      Oxygen Saturation (Exit) -- 99 %      Rating of Perceived Exertion (Exercise) -- 15      Perceived Dyspnea (Exercise) -- 13      Duration -- Continue with 30 min of aerobic exercise without signs/symptoms of physical distress.      Intensity -- THRR unchanged        Progression   Progression -- Continue to progress workloads to maintain intensity without signs/symptoms of physical distress.        Resistance Training   Training Prescription -- Yes      Weight -- 3      Reps -- 10-15      Time -- 10 Minutes        Oxygen   Oxygen -- Continuous      Liters -- 6        NuStep   Level -- 3      SPM -- 73      Minutes -- 22      METs -- 1.9        Arm Ergometer   Level -- 4      RPM -- 47      Minutes -- 17      METs -- 4        Home Exercise Plan   Plans to continue exercise at (comment) --      Frequency Add 1 additional day to program exercise sessions. --  Initial Home Exercises Provided 09/16/21 --               Exercise Comments:   Exercise Comments     Row Name 09/16/21 1144           Exercise Comments home  exercise reviewed                Exercise Goals and Review:   Exercise Goals     Row Name 07/05/21 1407 07/20/21 1249 08/17/21 1250 09/14/21 1314       Exercise Goals   Increase Physical Activity Yes Yes Yes Yes    Intervention Provide advice, education, support and counseling about physical activity/exercise needs.;Develop an individualized exercise prescription for aerobic and resistive training based on initial evaluation findings, risk stratification, comorbidities and participant's personal goals. Provide advice, education, support and counseling about physical activity/exercise needs.;Develop an individualized exercise prescription for aerobic and resistive training based on initial evaluation findings, risk stratification, comorbidities and participant's personal goals. Provide advice, education, support and counseling about physical activity/exercise needs.;Develop an individualized exercise prescription for aerobic and resistive training based on initial evaluation findings, risk stratification, comorbidities and participant's personal goals. Provide advice, education, support and counseling about physical activity/exercise needs.;Develop an individualized exercise prescription for aerobic and resistive training based on initial evaluation findings, risk stratification, comorbidities and participant's personal goals.    Expected Outcomes Short Term: Attend rehab on a regular basis to increase amount of physical activity.;Long Term: Exercising regularly at least 3-5 days a week.;Long Term: Add in home exercise to make exercise part of routine and to increase amount of physical activity. Short Term: Attend rehab on a regular basis to increase amount of physical activity.;Long Term: Exercising regularly at least 3-5 days a week.;Long Term: Add in home exercise to make exercise part of routine and to increase amount of physical activity. Short Term: Attend rehab on a regular basis to increase  amount of physical activity.;Long Term: Exercising regularly at least 3-5 days a week.;Long Term: Add in home exercise to make exercise part of routine and to increase amount of physical activity. Short Term: Attend rehab on a regular basis to increase amount of physical activity.;Long Term: Exercising regularly at least 3-5 days a week.;Long Term: Add in home exercise to make exercise part of routine and to increase amount of physical activity.    Increase Strength and Stamina Yes -- Yes Yes    Intervention Provide advice, education, support and counseling about physical activity/exercise needs.;Develop an individualized exercise prescription for aerobic and resistive training based on initial evaluation findings, risk stratification, comorbidities and participant's personal goals. Provide advice, education, support and counseling about physical activity/exercise needs.;Develop an individualized exercise prescription for aerobic and resistive training based on initial evaluation findings, risk stratification, comorbidities and participant's personal goals. Provide advice, education, support and counseling about physical activity/exercise needs.;Develop an individualized exercise prescription for aerobic and resistive training based on initial evaluation findings, risk stratification, comorbidities and participant's personal goals. Provide advice, education, support and counseling about physical activity/exercise needs.;Develop an individualized exercise prescription for aerobic and resistive training based on initial evaluation findings, risk stratification, comorbidities and participant's personal goals.    Expected Outcomes Short Term: Increase workloads from initial exercise prescription for resistance, speed, and METs.;Short Term: Perform resistance training exercises routinely during rehab and add in resistance training at home;Long Term: Improve cardiorespiratory fitness, muscular endurance and strength as  measured by increased METs and functional capacity ( ) Short Term: Increase workloads from initial exercise prescription  for resistance, speed, and METs.;Short Term: Perform resistance training exercises routinely during rehab and add in resistance training at home;Long Term: Improve cardiorespiratory fitness, muscular endurance and strength as measured by increased METs and functional capacity ( ) Short Term: Increase workloads from initial exercise prescription for resistance, speed, and METs.;Short Term: Perform resistance training exercises routinely during rehab and add in resistance training at home;Long Term: Improve cardiorespiratory fitness, muscular endurance and strength as measured by increased METs and functional capacity ( ) Short Term: Increase workloads from initial exercise prescription for resistance, speed, and METs.;Short Term: Perform resistance training exercises routinely during rehab and add in resistance training at home;Long Term: Improve cardiorespiratory fitness, muscular endurance and strength as measured by increased METs and functional capacity ( )    Able to understand and use rate of perceived exertion (RPE) scale Yes Yes Yes Yes    Intervention Provide education and explanation on how to use RPE scale Provide education and explanation on how to use RPE scale Provide education and explanation on how to use RPE scale Provide education and explanation on how to use RPE scale    Expected Outcomes Short Term: Able to use RPE daily in rehab to express subjective intensity level;Long Term:  Able to use RPE to guide intensity level when exercising independently Short Term: Able to use RPE daily in rehab to express subjective intensity level;Long Term:  Able to use RPE to guide intensity level when exercising independently Short Term: Able to use RPE daily in rehab to express subjective intensity level;Long Term:  Able to use RPE to guide intensity level when exercising  independently Short Term: Able to use RPE daily in rehab to express subjective intensity level;Long Term:  Able to use RPE to guide intensity level when exercising independently    Able to understand and use Dyspnea scale Yes Yes Yes Yes    Intervention Provide education and explanation on how to use Dyspnea scale Provide education and explanation on how to use Dyspnea scale Provide education and explanation on how to use Dyspnea scale Provide education and explanation on how to use Dyspnea scale    Expected Outcomes Short Term: Able to use Dyspnea scale daily in rehab to express subjective sense of shortness of breath during exertion;Long Term: Able to use Dyspnea scale to guide intensity level when exercising independently Short Term: Able to use Dyspnea scale daily in rehab to express subjective sense of shortness of breath during exertion;Long Term: Able to use Dyspnea scale to guide intensity level when exercising independently Short Term: Able to use Dyspnea scale daily in rehab to express subjective sense of shortness of breath during exertion;Long Term: Able to use Dyspnea scale to guide intensity level when exercising independently Short Term: Able to use Dyspnea scale daily in rehab to express subjective sense of shortness of breath during exertion;Long Term: Able to use Dyspnea scale to guide intensity level when exercising independently    Knowledge and understanding of Target Heart Rate Range (THRR) Yes Yes Yes Yes    Intervention Provide education and explanation of THRR including how the numbers were predicted and where they are located for reference Provide education and explanation of THRR including how the numbers were predicted and where they are located for reference Provide education and explanation of THRR including how the numbers were predicted and where they are located for reference Provide education and explanation of THRR including how the numbers were predicted and where they are  located for reference    Expected Outcomes  Short Term: Able to state/look up THRR;Long Term: Able to use THRR to govern intensity when exercising independently;Short Term: Able to use daily as guideline for intensity in rehab Short Term: Able to state/look up THRR;Long Term: Able to use THRR to govern intensity when exercising independently;Short Term: Able to use daily as guideline for intensity in rehab Short Term: Able to state/look up THRR;Long Term: Able to use THRR to govern intensity when exercising independently;Short Term: Able to use daily as guideline for intensity in rehab Short Term: Able to state/look up THRR;Long Term: Able to use THRR to govern intensity when exercising independently;Short Term: Able to use daily as guideline for intensity in rehab    Understanding of Exercise Prescription Yes Yes Yes Yes    Intervention Provide education, explanation, and written materials on patient's individual exercise prescription Provide education, explanation, and written materials on patient's individual exercise prescription Provide education, explanation, and written materials on patient's individual exercise prescription Provide education, explanation, and written materials on patient's individual exercise prescription    Expected Outcomes Short Term: Able to explain program exercise prescription;Long Term: Able to explain home exercise prescription to exercise independently Short Term: Able to explain program exercise prescription;Long Term: Able to explain home exercise prescription to exercise independently Short Term: Able to explain program exercise prescription;Long Term: Able to explain home exercise prescription to exercise independently Short Term: Able to explain program exercise prescription;Long Term: Able to explain home exercise prescription to exercise independently             Exercise Goals Re-Evaluation :  Exercise Goals Re-Evaluation     Row Name 07/20/21 1249 08/17/21  1250 09/14/21 1314         Exercise Goal Re-Evaluation   Exercise Goals Review Increase Physical Activity;Increase Strength and Stamina;Able to understand and use rate of perceived exertion (RPE) scale;Able to understand and use Dyspnea scale;Knowledge and understanding of Target Heart Rate Range (THRR);Understanding of Exercise Prescription Increase Physical Activity;Increase Strength and Stamina;Able to understand and use rate of perceived exertion (RPE) scale;Able to understand and use Dyspnea scale;Knowledge and understanding of Target Heart Rate Range (THRR);Understanding of Exercise Prescription Increase Physical Activity;Increase Strength and Stamina;Able to understand and use rate of perceived exertion (RPE) scale;Able to understand and use Dyspnea scale;Knowledge and understanding of Target Heart Rate Range (THRR);Able to check pulse independently;Understanding of Exercise Prescription     Comments Pt has completed 4 sesisons of pulmonary rehab. Pt is deconditioned ans will progress slowly. He is motivated to decrease his use of oxygen but on his "bad" days and when he feels SOB he will increase his oxygen from 4L to 6L. He is currently exercising at 2.0 METs on the AE. Will continue to monitor and progress as able. Pt has completed 10 seesions of pulmonary rehab. Pt is increasing his workload and is motivated to decrease his oxygen. His is still increasing his oxygen to 6L and using inhaler during exercise. He is currently exercising at 3.1 METs on the AE. Will continue to monitor and progress as able. Pt has completed 16 sessions of pulmonary rehab. Pt is slowly starting to increase his workload but does not push himself. He still increases his oxygen to 6 liters while in class when his chart says 4 liters. He is currently exercising at 3.1 METs on the AE. Will continue to monitor and progress as able.     Expected Outcomes Through exercise at rehab and at home, the patient will meet their stated  goals. Through exercise  at rehab and at home, the patient will meet their stated goals. Through exercise at rehab and at home, the patient will meet their stated goals.              Discharge Exercise Prescription (Final Exercise Prescription Changes):  Exercise Prescription Changes - 09/28/21 1400       Response to Exercise   Blood Pressure (Admit) 122/76    Blood Pressure (Exercise) 134/80    Blood Pressure (Exit) 130/82    Heart Rate (Admit) 90 bpm    Heart Rate (Exercise) 96 bpm    Heart Rate (Exit) 98 bpm    Oxygen Saturation (Admit) 98 %    Oxygen Saturation (Exercise) 98 %    Oxygen Saturation (Exit) 99 %    Rating of Perceived Exertion (Exercise) 15    Perceived Dyspnea (Exercise) 13    Duration Continue with 30 min of aerobic exercise without signs/symptoms of physical distress.    Intensity THRR unchanged      Progression   Progression Continue to progress workloads to maintain intensity without signs/symptoms of physical distress.      Resistance Training   Training Prescription Yes    Weight 3    Reps 10-15    Time 10 Minutes      Oxygen   Oxygen Continuous    Liters 6      NuStep   Level 3    SPM 73    Minutes 22    METs 1.9      Arm Ergometer   Level 4    RPM 47    Minutes 17    METs 4             Nutrition:  Target Goals: Understanding of nutrition guidelines, daily intake of sodium 1500mg , cholesterol 200mg , calories 30% from fat and 7% or less from saturated fats, daily to have 5 or more servings of fruits and vegetables.  Biometrics:  Pre Biometrics - 07/05/21 1408       Pre Biometrics   Height 5' 6.5" (1.689 m)    Weight 80.2 kg    Waist Circumference 44 inches    Hip Circumference 42.5 inches    Waist to Hip Ratio 1.04 %    BMI (Calculated) 28.11    Triceps Skinfold 20 mm    % Body Fat 31 %    Grip Strength 37.5 kg    Flexibility 4.5 in    Single Leg Stand 2.72 seconds              Nutrition Therapy Plan and  Nutrition Goals:  Nutrition Therapy & Goals - 08/10/21 1411       Personal Nutrition Goals   Comments We offer 2 educational sessions regarding heart healthy nutrition with handouts and assistance with RD referral if patient is interested.  Handout provided and explained regarding healthier choices and assesment discussed.  Patient verbalized understanding.      Intervention Plan   Intervention Nutrition handout(s) given to patient.    Expected Outcomes Short Term Goal: Understand basic principles of dietary content, such as calories, fat, sodium, cholesterol and nutrients.             Nutrition Assessments:  Nutrition Assessments - 07/05/21 1347       MEDFICTS Scores   Pre Score 57            MEDIFICTS Score Key: ?70 Need to make dietary changes  40-70 Heart Healthy Diet ? 40 Therapeutic Level  Cholesterol Diet   Picture Your Plate Scores: <40 Unhealthy dietary pattern with much room for improvement. 41-50 Dietary pattern unlikely to meet recommendations for good health and room for improvement. 51-60 More healthful dietary pattern, with some room for improvement.  >60 Healthy dietary pattern, although there may be some specific behaviors that could be improved.    Nutrition Goals Re-Evaluation:   Nutrition Goals Discharge (Final Nutrition Goals Re-Evaluation):   Psychosocial: Target Goals: Acknowledge presence or absence of significant depression and/or stress, maximize coping skills, provide positive support system. Participant is able to verbalize types and ability to use techniques and skills needed for reducing stress and depression.  Initial Review & Psychosocial Screening:  Initial Psych Review & Screening - 07/05/21 1405       Initial Review   Current issues with Current Sleep Concerns   PTSD     Family Dynamics   Good Support System? Yes      Barriers   Psychosocial barriers to participate in program There are no identifiable barriers or  psychosocial needs.   No psychosocial barriers identified but does have psychosocial needs.     Screening Interventions   Interventions Encouraged to exercise    Expected Outcomes Short Term goal: Identification and review with participant of any Quality of Life or Depression concerns found by scoring the questionnaire.;Long Term goal: The participant improves quality of Life and PHQ9 Scores as seen by post scores and/or verbalization of changes             Quality of Life Scores:  Quality of Life - 07/05/21 1403       Quality of Life   Select Quality of Life      Quality of Life Scores   Health/Function Pre 18.9 %    Socioeconomic Pre 19.08 %    Psych/Spiritual Pre 29.14 %    Family Pre 14.25 %    GLOBAL Pre 20.59 %            Scores of 19 and below usually indicate a poorer quality of life in these areas.  A difference of  2-3 points is a clinically meaningful difference.  A difference of 2-3 points in the total score of the Quality of Life Index has been associated with significant improvement in overall quality of life, self-image, physical symptoms, and general health in studies assessing change in quality of life.   PHQ-9: Review Flowsheet  More data exists      07/05/2021 03/04/2021 10/23/2017 07/13/2017 05/23/2017  Depression screen PHQ 2/9  Decreased Interest 1 0 0 0 0  Down, Depressed, Hopeless 1 2 0 1 0  PHQ - 2 Score 2 2 0 1 0  Altered sleeping 2 2 - 1 -  Tired, decreased energy 2 3 - 0 -  Change in appetite 1 2 - 0 -  Feeling bad or failure about yourself  0 0 - 0 -  Trouble concentrating 1 0 - 0 -  Moving slowly or fidgety/restless 0 1 - 0 -  Suicidal thoughts 0 0 - 0 -  PHQ-9 Score 8 10 - 2 -  Difficult doing work/chores Somewhat difficult Somewhat difficult - Not difficult at all -   Interpretation of Total Score  Total Score Depression Severity:  1-4 = Minimal depression, 5-9 = Mild depression, 10-14 = Moderate depression, 15-19 = Moderately severe  depression, 20-27 = Severe depression   Psychosocial Evaluation and Intervention:  Psychosocial Evaluation - 07/05/21 1408  Psychosocial Evaluation & Interventions   Interventions Encouraged to exercise with the program and follow exercise prescription    Comments Patient has no psychosocial barriers identified. 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 His PTSD is managed with Duloxetine 30 mg and Mirtazepine 30 mg and he sees a psychriatist at the [facility] monthly. He says he also talks to his pastor a lot about his emotional needs. He feels his PTSD is managed at this time but is an ongoing issue for him. He says he still has frequent bad dreams about [place] and this along with wearing a CPAP often disrupts his sleep but he is able to cope. He has 3 daughters, 2 that he is close with and multiple grandchildren and 3 sisters. He says all of his family support him and would help him with anything he needed. His initial PHQ-9 score was 8 related mainly to his SOB limiting his activity. He is ready to start the program hoping to get his lungs stronger enabling him to have lung reduction surgery.    Expected Outcomes Patient will continue to have no psychosocial barriers identified and his PTSD will continue to be managed.    Continue Psychosocial Services  No Follow up required             Psychosocial Re-Evaluation:  Psychosocial Re-Evaluation     Row Name 07/12/21 1439 08/10/21 1403 09/06/21 0756 10/04/21 1507       Psychosocial Re-Evaluation   Current issues with Current Psychotropic Meds;Current Stress Concerns Current Psychotropic Meds;Current Stress Concerns Current Psychotropic Meds;Current Stress Concerns Current Psychotropic Meds;Current Stress Concerns    Comments Patient is new to program (starting over) completing 1 sessions.  His initial QOL score is 19.58 and his PHQ-9 score is 8.  He seems to enjoy coming to program and interacts well with  class and staff.  He was referred to pulmonary rehab via Dr. Maryellen Pile with COPD.  Patient has a histroy of  PTSD and he on psychotropic meds, also is managed  with a consoler.   We will continue to monitor and  he will continue to have no psychosocial issuses. Patient is completing 8 sessions.   He seems to enjoy coming to program and interacts well with class and staff.  He was referred to pulmonary rehab via Dr. Maryellen Pile with COPD.  Patient has a histroy of  PTSD and he on psychotropic meds, also is managed  with a consoler. He demonstrate a positvie outlook.  We will continue to monitor as he progresses and he will continue to have no psychosocial issuses. Patient has completed 15 sessions.   He seems to enjoy coming to program and interacts well with class and staff.   Patient has a histroy of  PTSD and he on psychotropic meds, which is  managed  with medication. He demonstrate a positvie outlook.  We will continue to monitor as he progresses and he will continue to have no psychosocial issuses. Patient has completed 20 sessions.   He seems to enjoy coming to program and interacts well with class and staff.   Patient has a histroy of  PTSD and he on psychotropic meds, which is  managed  with medication. He demonstrate a positvie outlook.  We will continue to monitor as he progresses and he will continue to have no psychosocial issuses.    Expected Outcomes Patient will have no psychosocial barriers identified at discharge. Patient will have no  psychosocial barriers identified at discharge. Patient will have no psychosocial barriers identified at discharge. Patient will have no psychosocial barriers identified at discharge.    Interventions Stress management education;Relaxation education;Encouraged to attend Pulmonary Rehabilitation for the exercise Stress management education;Relaxation education;Encouraged to attend Pulmonary Rehabilitation for the exercise Stress management education;Relaxation  education;Encouraged to attend Pulmonary Rehabilitation for the exercise Stress management education;Relaxation education;Encouraged to attend Pulmonary Rehabilitation for the exercise    Continue Psychosocial Services  No Follow up required No Follow up required No Follow up required No Follow up required      Initial Review   Source of Stress Concerns -- -- None Identified None Identified             Psychosocial Discharge (Final Psychosocial Re-Evaluation):  Psychosocial Re-Evaluation - 10/04/21 1507       Psychosocial Re-Evaluation   Current issues with Current Psychotropic Meds;Current Stress Concerns    Comments Patient has completed 20 sessions.   He seems to enjoy coming to program and interacts well with class and staff.   Patient has a histroy of  PTSD and he on psychotropic meds, which is  managed  with medication. He demonstrate a positvie outlook.  We will continue to monitor as he progresses and he will continue to have no psychosocial issuses.    Expected Outcomes Patient will have no psychosocial barriers identified at discharge.    Interventions Stress management education;Relaxation education;Encouraged to attend Pulmonary Rehabilitation for the exercise    Continue Psychosocial Services  No Follow up required      Initial Review   Source of Stress Concerns None Identified              Education: Education Goals: Education classes will be provided on a weekly basis, covering required topics. Participant will state understanding/return demonstration of topics presented.  Learning Barriers/Preferences:   Education Topics: How Lungs Work and Diseases: - Discuss the anatomy of the lungs and diseases that can affect the lungs, such as COPD.   Exercise: -Discuss the importance of exercise, FITT principles of exercise, normal and abnormal responses to exercise, and how to exercise safely.   Environmental Irritants: -Discuss types of environmental irritants  and how to limit exposure to environmental irritants. Flowsheet Row PULMONARY REHAB CHRONIC OBSTRUCTIVE PULMONARY DISEASE from 10/07/2021 in [place] [facility]  Date 08/05/21  Educator hj  Instruction Review Code 1- Verbalizes Understanding       Meds/Inhalers and oxygen: - Discuss respiratory medications, definition of an inhaler and oxygen, and the proper way to use an inhaler and oxygen. Flowsheet Row PULMONARY REHAB CHRONIC OBSTRUCTIVE PULMONARY DISEASE from 10/07/2021 in [place] [facility]  Date 08/12/21  Educator hj       Energy Saving Techniques: - Discuss methods to conserve energy and decrease shortness of breath when performing activities of daily living.  Flowsheet Row PULMONARY REHAB CHRONIC OBSTRUCTIVE PULMONARY DISEASE from 10/07/2021 in [place] [facility]  Date 08/19/21  Educator HJ       Bronchial Hygiene / Breathing Techniques: - Discuss breathing mechanics, pursed-lip breathing technique,  proper posture, effective ways to clear airways, and other functional breathing techniques   Cleaning Equipment: - Provides group verbal and written instruction about the health risks of elevated stress, cause of high stress, and healthy ways to reduce stress. Flowsheet Row PULMONARY REHAB CHRONIC OBSTRUCTIVE PULMONARY DISEASE from 10/07/2021 in [place] [facility]  Date 09/02/21  Educator HJ  Instruction Review Code 1- Verbalizes Understanding  Nutrition I: Fats: - Discuss the types of cholesterol, what cholesterol does to the body, and how cholesterol levels can be controlled. Flowsheet Row PULMONARY REHAB CHRONIC OBSTRUCTIVE PULMONARY DISEASE from 10/07/2021 in [place] [facility]  Date 09/09/21  Educator pb  Instruction Review Code 1- Verbalizes Understanding       Nutrition II: Labels: -Discuss the different components of food labels and how to read food labels. Flowsheet Row  PULMONARY REHAB CHRONIC OBSTRUCTIVE PULMONARY DISEASE from 10/07/2021 in [place] [facility]  Date 09/16/21  Educator DM  Instruction Review Code 1- Verbalizes Understanding       Respiratory Infections: - Discuss the signs and symptoms of respiratory infections, ways to prevent respiratory infections, and the importance of seeking medical treatment when having a respiratory infection. Flowsheet Row PULMONARY REHAB CHRONIC OBSTRUCTIVE PULMONARY DISEASE from 03/25/2021 in [place] [facility]  Date 03/25/21  Educator DF  Instruction Review Code 2- Demonstrated Understanding       Stress I: Signs and Symptoms: - Discuss the causes of stress, how stress may lead to anxiety and depression, and ways to limit stress.   Stress II: Relaxation: -Discuss relaxation techniques to limit stress. Flowsheet Row PULMONARY REHAB CHRONIC OBSTRUCTIVE PULMONARY DISEASE from 10/07/2021 in [place] [facility]  Date 07/08/21  Educator HJ  Instruction Review Code 1- Verbalizes Understanding       Oxygen for Home/Travel: - Discuss how to prepare for travel when on oxygen and proper ways to transport and store oxygen to ensure safety.   Knowledge Questionnaire Score:  Knowledge Questionnaire Score - 07/05/21 1349       Knowledge Questionnaire Score   Pre Score 12/18             Core Components/Risk Factors/Patient Goals at Admission:  Personal Goals and Risk Factors at Admission - 07/05/21 1350       Core Components/Risk Factors/Patient Goals on Admission    Weight Management Weight Maintenance    Improve shortness of breath with ADL's Yes    Intervention Provide education, individualized exercise plan and daily activity instruction to help decrease symptoms of SOB with activities of daily living.    Expected Outcomes Short Term: Improve cardiorespiratory fitness to achieve a reduction of symptoms when performing ADLs;Long Term: Be able to perform  more ADLs without symptoms or delay the onset of symptoms    Personal Goal Other Yes    Personal Goal Patient wants to strength his lungs getting ready to have lung reduction surgery. He would like to improve his breathing overall and lower oxygen liter flow to 3 to 4 L/M.    Intervention Paitent will attend the PR program with exercise and education and supplement with exercise at home.    Expected Outcomes Patient complete the program meeting both personal and program goals.             Core Components/Risk Factors/Patient Goals Review:   Goals and Risk Factor Review     Row Name 07/12/21 1454 08/10/21 1414 09/06/21 0804 10/04/21 1510       Core Components/Risk Factors/Patient Goals Review   Personal Goals Review Improve shortness of breath with ADL's;Increase knowledge of respiratory medications and ability to use respiratory devices properly.;Develop more efficient breathing techniques such as purse lipped breathing and diaphragmatic breathing and practicing self-pacing with activity. Improve shortness of breath with ADL's;Increase knowledge of respiratory medications and ability to use respiratory devices properly.;Develop more efficient breathing techniques such as purse lipped breathing and diaphragmatic breathing  and practicing self-pacing with activity. Improve shortness of breath with ADL's;Increase knowledge of respiratory medications and ability to use respiratory devices properly.;Develop more efficient breathing techniques such as purse lipped breathing and diaphragmatic breathing and practicing self-pacing with activity. Improve shortness of breath with ADL's;Increase knowledge of respiratory medications and ability to use respiratory devices properly.;Develop more efficient breathing techniques such as purse lipped breathing and diaphragmatic breathing and practicing self-pacing with activity.    Review Patient is new to program attending 1 sessions (also he is starting over  again). He is referred to PR by Dr. Maryellen Pile with COPD .  His personal goals for the program are to get stronger, improve SOB with activity, breath better and get lungs stronger for lung reduction surgery.   We will continue to monitor his progress as he works toward meeting these goals.  O2 Sat during exercise averages at 100% with O2  in progress at 6L via nasal cannul.  We will encouage him to decrease  liters of oxygen while he works toward meeting his goals. Patient has completed 8 sessions. Maintiaining his weight at 80.5kg.  He continues to use Oxygen at 6 L via nasal cannule.  He as been instructed several time to try and decrease his oxygen level. O2 sat average during exercise at 97%-100% .  We will encouage him to decrease  liters of oxygen while he works toward meeting his goals.  His personal goals for the program are to get stronger, improve SOB with activity, breath better and get lungs stronger for lung reduction surgery.   We will continue to monitor his progress as he works toward meeting these goals. We will encouage him to decrease  liters of oxygen while he works toward meeting his goals. Patient has completed 15 sessions. Maintiaining his weight at 80.2kg.  He continues to use Oxygen at 6 L via nasal cannule during class sessions and tolerates well.  He as been instructed several time to try and decrease his oxygen level. O2 Sats average during exercise at 97%-100% on 4-5 liters .  We will continue to encouage him to decrease  liters of oxygen while he works toward meeting his goals.  His personal goals for the program are to get stronger, improve SOB with activity, breath better and get lungs stronger for lung reduction surgery.   We will continue to monitor his progress as he works toward meeting these goals. We will encouage him to decrease  liters of oxygen while he works toward meeting his goals. Patient has completed 20 sessions. Maintiaining his weight at 80.6kg.  He continues to use  Oxygen at 6 L/M via nasal cannule during class sessions and tolerates well.  He as been instructed several time to try and decrease his oxygen level. O2 Sats average during exercise at 97%-100% on 4-5 liters .  We will continue to encouage him to decrease  liters of oxygen while he works toward meeting his goals.  His personal goals for the program are to get stronger, improve SOB with activity, breathe better and get lungs stronger for lung reduction surgery.   We will continue to monitor his progress as he works toward meeting these goals. We will encouage him to decrease  liters of oxygen while he works toward meeting his goals.    Expected Outcomes Patient will complete the PR meeting both program and personal goals. Patient will complete the PR meeting both program and personal goals. Patient will complete the PR meeting both program and personal  goals. Patient will complete the PR meeting both program and personal goals.             Core Components/Risk Factors/Patient Goals at Discharge (Final Review):   Goals and Risk Factor Review - 10/04/21 1510       Core Components/Risk Factors/Patient Goals Review   Personal Goals Review Improve shortness of breath with ADL's;Increase knowledge of respiratory medications and ability to use respiratory devices properly.;Develop more efficient breathing techniques such as purse lipped breathing and diaphragmatic breathing and practicing self-pacing with activity.    Review Patient has completed 20 sessions. Maintiaining his weight at 80.6kg.  He continues to use Oxygen at 6 L/M via nasal cannule during class sessions and tolerates well.  He as been instructed several time to try and decrease his oxygen level. O2 Sats average during exercise at 97%-100% on 4-5 liters .  We will continue to encouage him to decrease  liters of oxygen while he works toward meeting his goals.  His personal goals for the program are to get stronger, improve SOB with activity,  breathe better and get lungs stronger for lung reduction surgery.   We will continue to monitor his progress as he works toward meeting these goals. We will encouage him to decrease  liters of oxygen while he works toward meeting his goals.    Expected Outcomes Patient will complete the PR meeting both program and personal goals.             ITP Comments:   Comments: ITP REVIEW Pt is making expected progress toward pulmonary rehab goals after completing 22 sessions. Recommend continued exercise, life style modification, education, and utilization of breathing techniques to increase stamina and strength and decrease shortness of breath with exertion.

## 2021-10-09 ENCOUNTER — Ambulatory Visit (INDEPENDENT_AMBULATORY_CARE_PROVIDER_SITE_OTHER): Payer: Non-veteran care

## 2021-10-09 ENCOUNTER — Ambulatory Visit (HOSPITAL_COMMUNITY)
Admission: EM | Admit: 2021-10-09 | Discharge: 2021-10-09 | Disposition: A | Discharge status: Home / Self Care | Payer: Non-veteran care | Attending: Emergency Medicine | Admitting: Emergency Medicine

## 2021-10-09 ENCOUNTER — Encounter (HOSPITAL_COMMUNITY): Payer: Self-pay

## 2021-10-09 DIAGNOSIS — S81811A Laceration without foreign body, right lower leg, initial encounter: Secondary | ICD-10-CM | POA: Diagnosis not present

## 2021-10-09 DIAGNOSIS — M25561 Pain in right knee: Secondary | ICD-10-CM

## 2021-10-09 DIAGNOSIS — W108XXA Fall (on) (from) other stairs and steps, initial encounter: Secondary | ICD-10-CM

## 2021-10-09 DIAGNOSIS — S80812A Abrasion, left lower leg, initial encounter: Secondary | ICD-10-CM | POA: Diagnosis not present

## 2021-10-09 NOTE — ED Provider Notes (Signed)
[name]    CSN: [id number] Arrival date & time: 10/09/21  1400     History   Chief Complaint Chief Complaint  Patient presents with   Fall   Laceration    HPI Gerald Williams is a 71 y.o. male.  Presents after falling on his cement steps.  Landed on right leg.  Did not hit his head, did not lose consciousness.  Had some bleeding to the right knee and shin that was wrapped by a neighbor with gauze and tape.  Bleeding is still occurring, small trickle.  He does not take blood thinner or aspirin. Two small abrasions to left shin, bleeding controlled.  Denies chest pain, shortness of breath, headache, dizziness, lightheadedness, weakness, numbness and tingling.  Hx hypertension.  COPD on oxygen 6L  Past Medical History:  Diagnosis Date   CHF (congestive heart failure) ([place])    Emphysema    Erectile dysfunction    Hyperlipemia    Hypertension    Polysubstance abuse ([place])    stopped using crack/cocaine December 4th 20-14   PTSD (post-traumatic stress disorder)    Sleep apnea     Patient Active Problem List   Diagnosis Date Noted   Solitary pulmonary nodule on lung CT 04/30/2021   Pleuritic chest pain 11/20/2020   Angina at rest [facility]) [id number]   Acute systolic heart failure (HCC)    Chronic respiratory failure with hypoxia ([place]) 09/18/2018   Exercise hypoxemia 11/07/2017   Frequent headaches 05/23/2017   Acute pain of right knee 12/23/2016   Old tear of lateral meniscus of right knee 12/23/2016   Blood in stool 10/05/2015   HTN (hypertension) 06/18/2015   Cephalalgia 06/18/2015   Dyslipidemia 06/12/2015   COPD GOLD III 05/28/2015   Peripheral neuropathy 05/20/2015   COPD (chronic obstructive pulmonary disease) with emphysema ([place]) 05/20/2015   PTSD (post-traumatic stress disorder) 05/20/2015   Right carotid bruit 05/20/2015   Frequent urination 05/20/2015   Hematuria 07/15/2014   History of tobacco abuse:  quit in ~ 06/2013  07/14/2014   OSA on  CPAP 07/14/2014   Pre-diabetes 07/14/2014   Claudication, R>L 07/14/2014   Cocaine use, history of 03/26/2011   Chest pain 03/26/2011    Past Surgical History:  Procedure Laterality Date   CARDIAC CATHETERIZATION     CYSTOSCOPY WITH INSERTION OF UROLIFT N/A 08/03/2020   Procedure: CYSTOSCOPY WITH INSERTION OF UROLIFT;  Surgeon: Franchot Gallo, MD;  Location: [facility] ORS;  Service: Urology;  Laterality: N/A;  30 MINS   LEFT HEART CATHETERIZATION WITH CORONARY ANGIOGRAM N/A 07/15/2014   Procedure: LEFT HEART CATHETERIZATION WITH CORONARY ANGIOGRAM;  Surgeon: Troy Sine, MD;  Location: [facility] CATH LAB;  Service: Cardiovascular;  Laterality: N/A;   RIGHT/LEFT HEART CATH AND CORONARY ANGIOGRAPHY N/A 10/30/2020   Procedure: RIGHT/LEFT HEART CATH AND CORONARY ANGIOGRAPHY;  Surgeon: Wellington Hampshire, MD;  Location: [place] CV LAB;  Service: Cardiovascular;  Laterality: N/A;     Home Medications    Prior to Admission medications   Medication Sig Start Date End Date Taking? Authorizing Provider  acetaminophen (TYLENOL) 500 MG tablet Take 1,000 mg by mouth every 6 (six) hours as needed for mild pain, fever or headache.    [provider]  albuterol (PROVENTIL) (2.5 MG/3ML) 0.083% nebulizer solution Take 2.5 mg by nebulization every 6 (six) hours as needed for wheezing or shortness of breath.    [provider]  atorvastatin (LIPITOR) 80 MG tablet Take 80 mg by mouth daily. 05/12/20  [provider]  diclofenac Sodium (VOLTAREN) 1 % GEL Apply 2 g topically 4 (four) times daily. 01/01/21   Particia Nearing, PA-C  DULoxetine (CYMBALTA) 60 MG capsule TAKE ONE CAPSULE BY MOUTH TWO TIMES A DAY FOR POSTTRAUMATIC STRESS DISORDER AND DEPRESSION 09/15/20   [provider]  fluticasone (FLONASE) 50 MCG/ACT nasal spray INSTILL 2 SPRAYS INTO EACH NOSTRIL EVERY DAY MAXIMUM 2 SPRAYS IN EACH NOSTRIL DAILY. FOR ALLERGIES 12/29/20   [provider]  furosemide (LASIX) 20  MG tablet Take 1 tablet (20 mg total) by mouth daily. 12/29/20   O'NealRonnald Ramp, MD  glucose blood (COOL BLOOD GLUCOSE TEST STRIPS) test strip Used to test blood sugar x2 daily---diagnosis code r73.03--for one touch verio flex 12/08/15   Burns, [name], MD  guaiFENesin (MUCINEX) 600 MG 12 hr tablet Take 600 mg by mouth 2 (two) times daily as needed. 02/07/20   [provider]  ipratropium (ATROVENT) 0.03 % nasal spray Place 1 spray into both nostrils every 8 (eight) hours as needed for rhinitis. 01/06/21   [name], MD  Ipratropium-Albuterol (COMBIVENT RESPIMAT) 20-100 MCG/ACT AERS respimat Inhale 1 puff into the lungs every 6 (six) hours. 03/10/21   [name], MD  Lancets MISC Used to test blood sugar x2 daily---diagnosis code r73.03--for one touch verio flex 12/08/15   [name], MD  losartan (COZAAR) 50 MG tablet Take 1 tablet (50 mg total) by mouth daily. 05/12/21   [name], [name], MD  metoprolol succinate (TOPROL-XL) 50 MG 24 hr tablet Take 1 tablet (50 mg total) by mouth daily. Take with or immediately following a meal. 12/11/20   [name], [name], MD  mirtazapine (REMERON) 30 MG tablet Take 30 mg by mouth at bedtime.    [provider]  OXYGEN Inhale 4-5 L into the lungs continuous.    [provider]  SYMBICORT 160-4.5 MCG/ACT inhaler Inhale 2 puffs into the lungs daily. 06/22/21   [provider]  tamsulosin (FLOMAX) 0.4 MG CAPS capsule Take 0.4 mg by mouth daily. 07/01/21   [provider]    Family History Family History  Problem Relation Age of Onset   Diabetes Mother        Deceased   Heart disease Father        Deceased   Heart attack Father        x5   Cancer - Other Sister    Healthy Son    Healthy Daughter    Heart attack Brother        Deceased   Alcohol abuse Brother        Deceased    Social History Social History   Tobacco Use   Smoking status: Former    Packs/day: 1.50    Years: 50.00     Total pack years: 75.00    Types: Cigarettes    Start date: 04/26/1963    Quit date: 09/22/2013    Years since quitting: 8.0   Smokeless tobacco: Never   Tobacco comments:    patient using the patch, losenges, and medication  Vaping Use   Vaping Use: Never used  Substance Use Topics   Alcohol use: Not Currently    Comment: He was drinking 3-4 drinks nightly x 40 years, quit in 2014.     Drug use: No    Comment: history of crack use and drug tested weekly at the [place]     Allergies   Tiotropium, Gabapentin, Nortriptyline, Budeson-glycopyrrol-formoterol,  Olodaterol, Oxcarbazepine, and Tiotropium bromide-olodaterol   Review of Systems Review of Systems  Per HPI  Physical Exam Triage Vital Signs ED Triage Vitals [10/09/21 1525]  Enc Vitals Group     BP (!) 183/101     Pulse Rate 94     Resp 18     Temp 98.3 F (36.8 C)     Temp Source Oral     SpO2 96 %     Weight      Height      Head Circumference      Peak Flow      Pain Score 4     Pain Loc      Pain Edu?      Excl. in GC?    No data found.  Updated Vital Signs BP (!) 167/94   Pulse 94   Temp 98.3 F (36.8 C) (Oral)   Resp 18   SpO2 96%   Physical Exam Vitals and nursing note reviewed.  Constitutional:      General: He is not in acute distress.    Appearance: Normal appearance.  HENT:     Mouth/Throat:     Pharynx: Oropharynx is clear.  Eyes:     Conjunctiva/sclera: Conjunctivae normal.     Pupils: Pupils are equal, round, and reactive to light.  Cardiovascular:     Rate and Rhythm: Normal rate and regular rhythm.     Heart sounds: Normal heart sounds.  Pulmonary:     Effort: Pulmonary effort is normal. No respiratory distress.     Breath sounds: Decreased air movement present. No wheezing, rhonchi or rales.     Comments: Wears [place] 6L Musculoskeletal:        General: Normal range of motion.     Cervical back: Normal range of motion.     Right lower leg: No edema.     Left lower leg: No  edema.       Legs:  Skin:    Findings: Wound present.     Comments: Abrasion to right knee. 2 open wounds to right leg below the knee. Full ROM. Lightly bleeding on exam. Clotted blood noted. Pain with palpation. Distal sensation and pulses intact. Two small abrasions to left shin. Bleeding controlled.  Neurological:     Mental Status: He is alert and oriented to person, place, and time.     Cranial Nerves: No facial asymmetry.     Sensory: Sensation is intact.     Gait: Gait is intact.     UC Treatments / Results  Labs (all labs ordered are listed, but only abnormal results are displayed) Labs Reviewed - No data to display  EKG  Radiology DG Tibia/Fibula Right  Result Date: 10/09/2021 CLINICAL DATA:  Fall. Pt reports falling off the porch and hitting his right knee. He did not hit head. No LOC. EXAM: RIGHT TIBIA AND FIBULA - 2 VIEW COMPARISON:  None Available. FINDINGS: There is no evidence of fracture or other focal bone lesions. Soft tissues are unremarkable. Vascular calcifications. IMPRESSION: Negative. Electronically Signed   By: Tish Frederickson M.D.   On: 10/09/2021 16:14    Procedures Procedures  Medications Ordered in UC Medications - No data to display  Initial Impression / Assessment and Plan / UC Course  I have reviewed the triage vital signs and the nursing notes.  Pertinent labs & imaging results that were available during my care of the patient were reviewed by me and considered in my medical decision  making (see chart for details).  Patient remains stable throughout visit. Bleeding controlled with pressure bandage. Right leg xray obtained negative for fracture. Left extremity abrasions cleaned, antibiotic ointment applied, dressed with gauze. Right extremity lacerations cleaned, would be difficult to close with sutures. At this time we will leave open and apply antibiotic ointment, gauze wrap. Patient does have a primary care provider he can follow up with. He  is not worried about scars and understands these wounds may take a little longer to heal. Recommend antibiotic ointment to the areas twice daily. Dicussed signs of infection, understands to go to emergency department if symptoms worsen. Tylenol for pain. Patient agrees to plan and is discharged in stable condition.  Final Clinical Impressions(s) / UC Diagnoses   Final diagnoses:  Laceration of right lower extremity, initial encounter  Leg abrasion, left, initial encounter     Discharge Instructions      Antibiotic ointment twice daily to the areas. (Bacitracin)  Apply pressure to the areas if they start bleeding. Tylenol for pain.  Emergency department if symptoms worsening.   Please follow up with your primary care provider.     ED Prescriptions   None    PDMP not reviewed this encounter.   Yavuz Kirby, Vernice Jefferson 10/09/21 1742

## 2021-10-09 NOTE — Discharge Instructions (Signed)
Antibiotic ointment twice daily to the areas. (Bacitracin)  Apply pressure to the areas if they start bleeding. Tylenol for pain.  Emergency department if symptoms worsening.   Please follow up with your primary care provider.

## 2021-10-09 NOTE — ED Triage Notes (Signed)
Pt reports falling off the porch and hitting his right knee. He did not hit head. No LOC.

## 2021-10-12 ENCOUNTER — Encounter (HOSPITAL_COMMUNITY): Payer: No Typology Code available for payment source

## 2021-10-13 NOTE — Progress Notes (Signed)
Pulmonary Individual Treatment Plan  Patient Details  Name: Gerald Williams MRN: [id number] Date of Birth: [birth year] Referring Provider:   Flowsheet Row PULMONARY REHAB COPD ORIENTATION from 07/05/2021 in [facility]  Referring Provider Dr. Spero Curb       Initial Encounter Date:  Flowsheet Row PULMONARY REHAB COPD ORIENTATION from 07/05/2021 in [facility]  Date 07/05/21       Visit Diagnosis: Chronic obstructive pulmonary disease, unspecified COPD type ([place])  Patient's Home Medications on Admission:   Current Outpatient Medications:    acetaminophen (TYLENOL) 500 MG tablet, Take 1,000 mg by mouth every 6 (six) hours as needed for mild pain, fever or headache., Disp: , Rfl:    albuterol (PROVENTIL) (2.5 MG/3ML) 0.083% nebulizer solution, Take 2.5 mg by nebulization every 6 (six) hours as needed for wheezing or shortness of breath., Disp: , Rfl:    atorvastatin (LIPITOR) 80 MG tablet, Take 80 mg by mouth daily., Disp: , Rfl:    diclofenac Sodium (VOLTAREN) 1 % GEL, Apply 2 g topically 4 (four) times daily., Disp: 150 g, Rfl: 1   DULoxetine (CYMBALTA) 60 MG capsule, TAKE ONE CAPSULE BY MOUTH TWO TIMES A DAY FOR POSTTRAUMATIC STRESS DISORDER AND DEPRESSION, Disp: , Rfl:    fluticasone (FLONASE) 50 MCG/ACT nasal spray, INSTILL 2 SPRAYS INTO EACH NOSTRIL EVERY DAY MAXIMUM 2 SPRAYS IN EACH NOSTRIL DAILY. FOR ALLERGIES, Disp: , Rfl:    furosemide (LASIX) 20 MG tablet, Take 1 tablet (20 mg total) by mouth daily., Disp: 90 tablet, Rfl: 1   glucose blood (COOL BLOOD GLUCOSE TEST STRIPS) test strip, Used to test blood sugar x2 daily---diagnosis code r73.03--for one touch verio flex, Disp: 200 each, Rfl: 3   guaiFENesin (MUCINEX) 600 MG 12 hr tablet, Take 600 mg by mouth 2 (two) times daily as needed., Disp: , Rfl:    ipratropium (ATROVENT) 0.03 % nasal spray, Place 1 spray into both nostrils every 8 (eight) hours as needed for rhinitis., Disp: 30 mL,  Rfl: 5   Ipratropium-Albuterol (COMBIVENT RESPIMAT) 20-100 MCG/ACT AERS respimat, Inhale 1 puff into the lungs every 6 (six) hours., Disp: 4 g, Rfl: 6   Lancets MISC, Used to test blood sugar x2 daily---diagnosis code r73.03--for one touch verio flex, Disp: 200 each, Rfl: 3   losartan (COZAAR) 50 MG tablet, Take 1 tablet (50 mg total) by mouth daily., Disp: 90 tablet, Rfl: 1   metoprolol succinate (TOPROL-XL) 50 MG 24 hr tablet, Take 1 tablet (50 mg total) by mouth daily. Take with or immediately following a meal., Disp: 90 tablet, Rfl: 1   mirtazapine (REMERON) 30 MG tablet, Take 30 mg by mouth at bedtime., Disp: , Rfl:    OXYGEN, Inhale 4-5 L into the lungs continuous., Disp: , Rfl:    SYMBICORT 160-4.5 MCG/ACT inhaler, Inhale 2 puffs into the lungs daily., Disp: , Rfl:    tamsulosin (FLOMAX) 0.4 MG CAPS capsule, Take 0.4 mg by mouth daily., Disp: , Rfl:   Past Medical History: Past Medical History:  Diagnosis Date   CHF (congestive heart failure) ([place])    Emphysema    Erectile dysfunction    Hyperlipemia    Hypertension    Polysubstance abuse ([place])    stopped using crack/cocaine December 4th 20-14   PTSD (post-traumatic stress disorder)    Sleep apnea     Tobacco Use: Social History   Tobacco Use  Smoking Status Former   Packs/day: 1.50   Years: 50.00   Total pack years:  75.00   Types: Cigarettes   Start date: 04/26/1963   Quit date: 09/22/2013   Years since quitting: 8.0  Smokeless Tobacco Never  Tobacco Comments   patient using the patch, losenges, and medication    Labs: Review Flowsheet  More data exists      Latest Ref Rng & Units 01/01/2016 05/07/2019 10/29/2020 10/30/2020  Labs for ITP Cardiac and Pulmonary Rehab  Cholestrol 0 - 200 mg/dL - 218  244  -  LDL (calc) 0 - 99 mg/dL - 142  147  -  HDL-C >40 mg/dL - 59  79  -  Trlycerides <150 mg/dL - 84  92  -  Hemoglobin A1c 4.8 - 5.6 % 6.2  6.1  - -  PH, Arterial 7.350 - 7.450 - - - -  PCO2 arterial 32.0 - 48.0 mmHg -  - - -  Bicarbonate 20.0 - 28.0 mmol/L - - - 30.1  30.5   TCO2 22 - 32 mmol/L 22 - 32 mmol/L - - - 32  32   O2 Saturation % - - - 98.0  64.0       10/31/2020  Labs for ITP Cardiac and Pulmonary Rehab  Cholestrol -  LDL (calc) -  HDL-C -  Trlycerides -  Hemoglobin A1c -  PH, Arterial 7.371   PCO2 arterial 50.2   Bicarbonate 28.3   TCO2 -  O2 Saturation 91.8     Capillary Blood Glucose: Lab Results  Component Value Date   GLUCAP 145 (H) 10/30/2020   GLUCAP 97 10/24/2018   GLUCAP 95 07/16/2014   GLUCAP 87 07/16/2014   GLUCAP 89 07/15/2014     Pulmonary Assessment Scores:  Pulmonary Assessment Scores     Row Name 07/05/21 1353         ADL UCSD   ADL Phase Entry     SOB Score total 70     Rest 4     Walk 3     Stairs 2     Bath 3     Dress 3     Shop 3       CAT Score   CAT Score 24       mMRC Score   mMRC Score 3             UCSD: Self-administered rating of dyspnea associated with activities of daily living (ADLs) 6-point scale (0 = "not at all" to 5 = "maximal or unable to do because of breathlessness")  Scoring Scores range from 0 to 120.  Minimally important difference is 5 units  CAT: CAT can identify the health impairment of COPD patients and is better correlated with disease progression.  CAT has a scoring range of zero to 40. The CAT score is classified into four groups of low (less than 10), medium (10 - 20), high (21-30) and very high (31-40) based on the impact level of disease on health status. A CAT score over 10 suggests significant symptoms.  A worsening CAT score could be explained by an exacerbation, poor medication adherence, poor inhaler technique, or progression of COPD or comorbid conditions.  CAT MCID is 2 points  mMRC: mMRC (Modified Medical Research Council) Dyspnea Scale is used to assess the degree of baseline functional disability in patients of respiratory disease due to dyspnea. No minimal important difference is  established. A decrease in score of 1 point or greater is considered a positive change.   Pulmonary Function Assessment:   Exercise Target Goals: Exercise  Program Goal: Individual exercise prescription set using results from initial 6 min walk test and THRR while considering  patient's activity barriers and safety.   Exercise Prescription Goal: Initial exercise prescription builds to 30-45 minutes a day of aerobic activity, 2-3 days per week.  Home exercise guidelines will be given to patient during program as part of exercise prescription that the participant will acknowledge.  Activity Barriers & Risk Stratification:  Activity Barriers & Cardiac Risk Stratification - 07/05/21 1420       Activity Barriers & Cardiac Risk Stratification   Activity Barriers Deconditioning;Shortness of Breath    Cardiac Risk Stratification High             6 Minute Walk:  6 Minute Walk     Row Name 07/05/21 1404         6 Minute Walk   Phase Initial     Distance 925 feet     Walk Time 6 minutes     # of Rest Breaks 1     MPH 1.75     METS 2.54     RPE 17     Perceived Dyspnea  13     VO2 Peak 8.9     Symptoms Yes (comment)     Comments 1 standing rest break for 30 sec due to SOB     Resting HR 84 bpm     Resting BP 144/88     Resting Oxygen Saturation  100 %     Exercise Oxygen Saturation  during 6 min walk 93 %     Max Ex. HR 113 bpm     Max Ex. BP 150/98     2 Minute Post BP 140/98       Interval HR   1 Minute HR 93     2 Minute HR 95     3 Minute HR 100     4 Minute HR 107     5 Minute HR 113     6 Minute HR 110     2 Minute Post HR 97     Interval Heart Rate? Yes       Interval Oxygen   Interval Oxygen? Yes     Baseline Oxygen Saturation % 100 %     1 Minute Oxygen Saturation % 99 %     1 Minute Liters of Oxygen 4 L     2 Minute Oxygen Saturation % 95 %     2 Minute Liters of Oxygen 4 L     3 Minute Oxygen Saturation % 97 %     3 Minute Liters of Oxygen 4 L      4 Minute Oxygen Saturation % 96 %     4 Minute Liters of Oxygen 4 L     5 Minute Oxygen Saturation % 94 %     5 Minute Liters of Oxygen 4 L     6 Minute Oxygen Saturation % 93 %     6 Minute Liters of Oxygen 4 L     2 Minute Post Oxygen Saturation % 99 %     2 Minute Post Liters of Oxygen 4 L              Oxygen Initial Assessment:  Oxygen Initial Assessment - 07/05/21 1422       Home Oxygen   Home Oxygen Device E-Tanks;Home Concentrator    Sleep Oxygen Prescription CPAP    Liters per minute 0  Home Exercise Oxygen Prescription Continuous    Liters per minute 5    Home Resting Oxygen Prescription Continuous    Liters per minute 4    Compliance with Home Oxygen Use Yes      Initial 6 min Walk   Oxygen Used Continuous      Program Oxygen Prescription   Program Oxygen Prescription Continuous      Intervention   Short Term Goals To learn and demonstrate proper pursed lip breathing techniques or other breathing techniques. ;To learn and understand importance of monitoring SPO2 with pulse oximeter and demonstrate accurate use of the pulse oximeter.;To learn and exhibit compliance with exercise, home and travel O2 prescription;To learn and understand importance of maintaining oxygen saturations>88%    Long  Term Goals Exhibits compliance with exercise, home  and travel O2 prescription;Verbalizes importance of monitoring SPO2 with pulse oximeter and return demonstration;Maintenance of O2 saturations>88%;Exhibits proper breathing techniques, such as pursed lip breathing or other method taught during program session             Oxygen Re-Evaluation:  Oxygen Re-Evaluation     Row Name 07/20/21 1252 08/17/21 1253 10/12/21 1237         Program Oxygen Prescription   Program Oxygen Prescription Continuous Continuous Continuous     Liters per minute 4 4 6        Home Oxygen   Home Oxygen Device E-Tanks;Home Concentrator E-Tanks;Home Concentrator E-Tanks;Home Concentrator      Sleep Oxygen Prescription CPAP CPAP CPAP     Liters per minute 0 0 0     Home Exercise Oxygen Prescription Continuous Continuous Continuous     Liters per minute 5 5 5      Home Resting Oxygen Prescription Continuous Continuous --     Liters per minute 4 4 4      Compliance with Home Oxygen Use Yes Yes Yes       Goals/Expected Outcomes   Short Term Goals To learn and demonstrate proper pursed lip breathing techniques or other breathing techniques. ;To learn and exhibit compliance with exercise, home and travel O2 prescription;To learn and understand importance of maintaining oxygen saturations>88%;To learn and understand importance of monitoring SPO2 with pulse oximeter and demonstrate accurate use of the pulse oximeter. To learn and demonstrate proper pursed lip breathing techniques or other breathing techniques. ;To learn and exhibit compliance with exercise, home and travel O2 prescription;To learn and understand importance of maintaining oxygen saturations>88%;To learn and understand importance of monitoring SPO2 with pulse oximeter and demonstrate accurate use of the pulse oximeter. To learn and demonstrate proper pursed lip breathing techniques or other breathing techniques. ;To learn and exhibit compliance with exercise, home and travel O2 prescription;To learn and understand importance of maintaining oxygen saturations>88%;To learn and understand importance of monitoring SPO2 with pulse oximeter and demonstrate accurate use of the pulse oximeter.     Long  Term Goals Exhibits compliance with exercise, home  and travel O2 prescription;Verbalizes importance of monitoring SPO2 with pulse oximeter and return demonstration;Maintenance of O2 saturations>88%;Exhibits proper breathing techniques, such as pursed lip breathing or other method taught during program session Exhibits compliance with exercise, home  and travel O2 prescription;Verbalizes importance of monitoring SPO2 with pulse oximeter and  return demonstration;Maintenance of O2 saturations>88%;Exhibits proper breathing techniques, such as pursed lip breathing or other method taught during program session Exhibits compliance with exercise, home  and travel O2 prescription;Verbalizes importance of monitoring SPO2 with pulse oximeter and return demonstration;Maintenance of O2 saturations>88%;Exhibits proper breathing techniques,  such as pursed lip breathing or other method taught during program session     Goals/Expected Outcomes compliance compliance compliance              Oxygen Discharge (Final Oxygen Re-Evaluation):  Oxygen Re-Evaluation - 10/12/21 1237       Program Oxygen Prescription   Program Oxygen Prescription Continuous    Liters per minute 6      Home Oxygen   Home Oxygen Device E-Tanks;Home Concentrator    Sleep Oxygen Prescription CPAP    Liters per minute 0    Home Exercise Oxygen Prescription Continuous    Liters per minute 5    Liters per minute 4    Compliance with Home Oxygen Use Yes      Goals/Expected Outcomes   Short Term Goals To learn and demonstrate proper pursed lip breathing techniques or other breathing techniques. ;To learn and exhibit compliance with exercise, home and travel O2 prescription;To learn and understand importance of maintaining oxygen saturations>88%;To learn and understand importance of monitoring SPO2 with pulse oximeter and demonstrate accurate use of the pulse oximeter.    Long  Term Goals Exhibits compliance with exercise, home  and travel O2 prescription;Verbalizes importance of monitoring SPO2 with pulse oximeter and return demonstration;Maintenance of O2 saturations>88%;Exhibits proper breathing techniques, such as pursed lip breathing or other method taught during program session    Goals/Expected Outcomes compliance             Initial Exercise Prescription:  Initial Exercise Prescription - 07/05/21 1400       Date of Initial Exercise RX and Referring Provider    Date 07/05/21    Referring Provider Dr. Spero Curb    Expected Discharge Date 11/09/21      Oxygen   Oxygen Continuous    Liters 4    Maintain Oxygen Saturation 88% or higher      NuStep   Level 1    SPM 80    Minutes 22      Arm Ergometer   Level 1    RPM 60    Minutes 17      Prescription Details   Frequency (times per week) 2    Duration Progress to 30 minutes of continuous aerobic without signs/symptoms of physical distress      Intensity   THRR 40-80% of Max Heartrate 60-121    Ratings of Perceived Exertion 11-13    Perceived Dyspnea 0-4      Resistance Training   Training Prescription Yes    Weight 3    Reps 10-15             Perform Capillary Blood Glucose checks as needed.  Exercise Prescription Changes:   Exercise Prescription Changes     Row Name 07/20/21 1200 08/03/21 1200 08/17/21 1200 08/31/21 1200 09/14/21 1300     Response to Exercise   Blood Pressure (Admit) 132/80 152/83 138/70 126/70 130/68   Blood Pressure (Exercise) 140/78 132/74 146/70 168/80 160/60   Blood Pressure (Exit) 116/72 130/70 136/80 128/76 140/82   Heart Rate (Admit) 94 bpm 93 bpm 87 bpm 98 bpm 103 bpm   Heart Rate (Exercise) 102 bpm 101 bpm 100 bpm 105 bpm 107 bpm   Heart Rate (Exit) 83 bpm 92 bpm 87 bpm 99 bpm 95 bpm   Oxygen Saturation (Admit) 98 % 96 % 98 % 96 % 97 %   Oxygen Saturation (Exercise) 98 % 98 % 99 % 98 % 98 %   Oxygen Saturation (  Exit) 100 % 100 % 100 % 100 % 99 %   Rating of Perceived Exertion (Exercise) 13 12 15 15 15    Perceived Dyspnea (Exercise) 13 12 12 13 15    Duration Continue with 30 min of aerobic exercise without signs/symptoms of physical distress. Continue with 30 min of aerobic exercise without signs/symptoms of physical distress. Continue with 30 min of aerobic exercise without signs/symptoms of physical distress. Continue with 30 min of aerobic exercise without signs/symptoms of physical distress. Continue with 30 min of aerobic exercise  without signs/symptoms of physical distress.   Intensity THRR unchanged THRR unchanged THRR unchanged THRR unchanged THRR unchanged     Progression   Progression Continue to progress workloads to maintain intensity without signs/symptoms of physical distress. Continue to progress workloads to maintain intensity without signs/symptoms of physical distress. Continue to progress workloads to maintain intensity without signs/symptoms of physical distress. Continue to progress workloads to maintain intensity without signs/symptoms of physical distress. Continue to progress workloads to maintain intensity without signs/symptoms of physical distress.     Resistance Training   Training Prescription Yes Yes Yes Yes Yes   Weight 2 3 3 2 2    Reps 10-15 10-15 10-15 10-15 10-15   Time 10 Minutes 10 Minutes 10 Minutes 10 Minutes 10 Minutes     Oxygen   Oxygen Continuous Continuous Continuous Continuous Continuous   Liters 3 6 6 6 6      NuStep   Level 1 2 3 3 3    SPM 71 82 72 69 76   Minutes 22 22 22 22 22    METs 1.8 1.9 1.9 1.9 1.9     Arm Ergometer   Level 1 1 3 3 3    RPM 60 60 57 59 58   Minutes 17 17 17 17 17    METs 2 1.8 3.1 3.3 3.1    Row Name 09/16/21 1100 09/28/21 1400 10/07/21 1200         Response to Exercise   Blood Pressure (Admit) -- 122/76 114/72     Blood Pressure (Exercise) -- 134/80 150/80     Blood Pressure (Exit) -- 130/82 128/72     Heart Rate (Admit) -- 90 bpm 89 bpm     Heart Rate (Exercise) -- 96 bpm 104 bpm     Heart Rate (Exit) -- 98 bpm 92 bpm     Oxygen Saturation (Admit) -- 98 % 95 %     Oxygen Saturation (Exercise) -- 98 % 98 %     Oxygen Saturation (Exit) -- 99 % 100 %     Rating of Perceived Exertion (Exercise) -- 15 14     Perceived Dyspnea (Exercise) -- 13 12     Duration -- Continue with 30 min of aerobic exercise without signs/symptoms of physical distress. Continue with 30 min of aerobic exercise without signs/symptoms of physical distress.      Intensity -- THRR unchanged THRR unchanged       Progression   Progression -- Continue to progress workloads to maintain intensity without signs/symptoms of physical distress. Continue to progress workloads to maintain intensity without signs/symptoms of physical distress.       Resistance Training   Training Prescription -- Yes Yes     Weight -- 3 3     Reps -- 10-15 10-15     Time -- 10 Minutes 10 Minutes       Oxygen   Oxygen -- Continuous Continuous     Liters -- 6 6  Treadmill   MPH -- -- 1.1     Grade -- -- 0     Minutes -- -- 22     METs -- -- 1.84       NuStep   Level -- 3 --     SPM -- 73 --     Minutes -- 22 --     METs -- 1.9 --       Arm Ergometer   Level -- 4 3     RPM -- 47 28     Minutes -- 17 17     METs -- 4 3.3       Home Exercise Plan   Plans to continue exercise at [employer] (comment) -- --     Frequency Add 1 additional day to program exercise sessions. -- --     Initial Home Exercises Provided 09/16/21 -- --              Exercise Comments:   Exercise Comments     Row Name 09/16/21 1144           Exercise Comments home exercise reviewed                Exercise Goals and Review:   Exercise Goals     Row Name 07/05/21 1407 07/20/21 1249 08/17/21 1250 09/14/21 1314 10/12/21 1233     Exercise Goals   Increase Physical Activity Yes Yes Yes Yes Yes   Intervention Provide advice, education, support and counseling about physical activity/exercise needs.;Develop an individualized exercise prescription for aerobic and resistive training based on initial evaluation findings, risk stratification, comorbidities and participant's personal goals. Provide advice, education, support and counseling about physical activity/exercise needs.;Develop an individualized exercise prescription for aerobic and resistive training based on initial evaluation findings, risk stratification, comorbidities and participant's personal goals. Provide  advice, education, support and counseling about physical activity/exercise needs.;Develop an individualized exercise prescription for aerobic and resistive training based on initial evaluation findings, risk stratification, comorbidities and participant's personal goals. Provide advice, education, support and counseling about physical activity/exercise needs.;Develop an individualized exercise prescription for aerobic and resistive training based on initial evaluation findings, risk stratification, comorbidities and participant's personal goals. Provide advice, education, support and counseling about physical activity/exercise needs.;Develop an individualized exercise prescription for aerobic and resistive training based on initial evaluation findings, risk stratification, comorbidities and participant's personal goals.   Expected Outcomes Short Term: Attend rehab on a regular basis to increase amount of physical activity.;Long Term: Exercising regularly at least 3-5 days a week.;Long Term: Add in home exercise to make exercise part of routine and to increase amount of physical activity. Short Term: Attend rehab on a regular basis to increase amount of physical activity.;Long Term: Exercising regularly at least 3-5 days a week.;Long Term: Add in home exercise to make exercise part of routine and to increase amount of physical activity. Short Term: Attend rehab on a regular basis to increase amount of physical activity.;Long Term: Exercising regularly at least 3-5 days a week.;Long Term: Add in home exercise to make exercise part of routine and to increase amount of physical activity. Short Term: Attend rehab on a regular basis to increase amount of physical activity.;Long Term: Exercising regularly at least 3-5 days a week.;Long Term: Add in home exercise to make exercise part of routine and to increase amount of physical activity. Short Term: Attend rehab on a regular basis to increase amount of physical  activity.;Long Term: Exercising regularly at least 3-5 days a  week.;Long Term: Add in home exercise to make exercise part of routine and to increase amount of physical activity.   Increase Strength and Stamina Yes -- Yes Yes Yes   Intervention Provide advice, education, support and counseling about physical activity/exercise needs.;Develop an individualized exercise prescription for aerobic and resistive training based on initial evaluation findings, risk stratification, comorbidities and participant's personal goals. Provide advice, education, support and counseling about physical activity/exercise needs.;Develop an individualized exercise prescription for aerobic and resistive training based on initial evaluation findings, risk stratification, comorbidities and participant's personal goals. Provide advice, education, support and counseling about physical activity/exercise needs.;Develop an individualized exercise prescription for aerobic and resistive training based on initial evaluation findings, risk stratification, comorbidities and participant's personal goals. Provide advice, education, support and counseling about physical activity/exercise needs.;Develop an individualized exercise prescription for aerobic and resistive training based on initial evaluation findings, risk stratification, comorbidities and participant's personal goals. Provide advice, education, support and counseling about physical activity/exercise needs.;Develop an individualized exercise prescription for aerobic and resistive training based on initial evaluation findings, risk stratification, comorbidities and participant's personal goals.   Expected Outcomes Short Term: Increase workloads from initial exercise prescription for resistance, speed, and METs.;Short Term: Perform resistance training exercises routinely during rehab and add in resistance training at home;Long Term: Improve cardiorespiratory fitness, muscular endurance and  strength as measured by increased METs and functional capacity (6MWT) Short Term: Increase workloads from initial exercise prescription for resistance, speed, and METs.;Short Term: Perform resistance training exercises routinely during rehab and add in resistance training at home;Long Term: Improve cardiorespiratory fitness, muscular endurance and strength as measured by increased METs and functional capacity (6MWT) Short Term: Increase workloads from initial exercise prescription for resistance, speed, and METs.;Short Term: Perform resistance training exercises routinely during rehab and add in resistance training at home;Long Term: Improve cardiorespiratory fitness, muscular endurance and strength as measured by increased METs and functional capacity (6MWT) Short Term: Increase workloads from initial exercise prescription for resistance, speed, and METs.;Short Term: Perform resistance training exercises routinely during rehab and add in resistance training at home;Long Term: Improve cardiorespiratory fitness, muscular endurance and strength as measured by increased METs and functional capacity (6MWT) Short Term: Increase workloads from initial exercise prescription for resistance, speed, and METs.;Short Term: Perform resistance training exercises routinely during rehab and add in resistance training at home;Long Term: Improve cardiorespiratory fitness, muscular endurance and strength as measured by increased METs and functional capacity (6MWT)   Able to understand and use rate of perceived exertion (RPE) scale Yes Yes Yes Yes Yes   Intervention Provide education and explanation on how to use RPE scale Provide education and explanation on how to use RPE scale Provide education and explanation on how to use RPE scale Provide education and explanation on how to use RPE scale Provide education and explanation on how to use RPE scale   Expected Outcomes Short Term: Able to use RPE daily in rehab to express  subjective intensity level;Long Term:  Able to use RPE to guide intensity level when exercising independently Short Term: Able to use RPE daily in rehab to express subjective intensity level;Long Term:  Able to use RPE to guide intensity level when exercising independently Short Term: Able to use RPE daily in rehab to express subjective intensity level;Long Term:  Able to use RPE to guide intensity level when exercising independently Short Term: Able to use RPE daily in rehab to express subjective intensity level;Long Term:  Able to use RPE to guide intensity level when  exercising independently Short Term: Able to use RPE daily in rehab to express subjective intensity level;Long Term:  Able to use RPE to guide intensity level when exercising independently   Able to understand and use Dyspnea scale Yes Yes Yes Yes Yes   Intervention Provide education and explanation on how to use Dyspnea scale Provide education and explanation on how to use Dyspnea scale Provide education and explanation on how to use Dyspnea scale Provide education and explanation on how to use Dyspnea scale Provide education and explanation on how to use Dyspnea scale   Expected Outcomes Short Term: Able to use Dyspnea scale daily in rehab to express subjective sense of shortness of breath during exertion;Long Term: Able to use Dyspnea scale to guide intensity level when exercising independently Short Term: Able to use Dyspnea scale daily in rehab to express subjective sense of shortness of breath during exertion;Long Term: Able to use Dyspnea scale to guide intensity level when exercising independently Short Term: Able to use Dyspnea scale daily in rehab to express subjective sense of shortness of breath during exertion;Long Term: Able to use Dyspnea scale to guide intensity level when exercising independently Short Term: Able to use Dyspnea scale daily in rehab to express subjective sense of shortness of breath during exertion;Long Term: Able  to use Dyspnea scale to guide intensity level when exercising independently Short Term: Able to use Dyspnea scale daily in rehab to express subjective sense of shortness of breath during exertion;Long Term: Able to use Dyspnea scale to guide intensity level when exercising independently   Knowledge and understanding of Target Heart Rate Range (THRR) Yes Yes Yes Yes Yes   Intervention Provide education and explanation of THRR including how the numbers were predicted and where they are located for reference Provide education and explanation of THRR including how the numbers were predicted and where they are located for reference Provide education and explanation of THRR including how the numbers were predicted and where they are located for reference Provide education and explanation of THRR including how the numbers were predicted and where they are located for reference Provide education and explanation of THRR including how the numbers were predicted and where they are located for reference   Expected Outcomes Short Term: Able to state/look up THRR;Long Term: Able to use THRR to govern intensity when exercising independently;Short Term: Able to use daily as guideline for intensity in rehab Short Term: Able to state/look up THRR;Long Term: Able to use THRR to govern intensity when exercising independently;Short Term: Able to use daily as guideline for intensity in rehab Short Term: Able to state/look up THRR;Long Term: Able to use THRR to govern intensity when exercising independently;Short Term: Able to use daily as guideline for intensity in rehab Short Term: Able to state/look up THRR;Long Term: Able to use THRR to govern intensity when exercising independently;Short Term: Able to use daily as guideline for intensity in rehab Short Term: Able to state/look up THRR;Long Term: Able to use THRR to govern intensity when exercising independently;Short Term: Able to use daily as guideline for intensity in rehab    Understanding of Exercise Prescription Yes Yes Yes Yes Yes   Intervention Provide education, explanation, and written materials on patient's individual exercise prescription Provide education, explanation, and written materials on patient's individual exercise prescription Provide education, explanation, and written materials on patient's individual exercise prescription Provide education, explanation, and written materials on patient's individual exercise prescription Provide education, explanation, and written materials on patient's individual exercise prescription  Expected Outcomes Short Term: Able to explain program exercise prescription;Long Term: Able to explain home exercise prescription to exercise independently Short Term: Able to explain program exercise prescription;Long Term: Able to explain home exercise prescription to exercise independently Short Term: Able to explain program exercise prescription;Long Term: Able to explain home exercise prescription to exercise independently Short Term: Able to explain program exercise prescription;Long Term: Able to explain home exercise prescription to exercise independently Short Term: Able to explain program exercise prescription;Long Term: Able to explain home exercise prescription to exercise independently            Exercise Goals Re-Evaluation :  Exercise Goals Re-Evaluation     Row Name 07/20/21 1249 08/17/21 1250 09/14/21 1314 10/12/21 1234       Exercise Goal Re-Evaluation   Exercise Goals Review Increase Physical Activity;Increase Strength and Stamina;Able to understand and use rate of perceived exertion (RPE) scale;Able to understand and use Dyspnea scale;Knowledge and understanding of Target Heart Rate Range (THRR);Understanding of Exercise Prescription Increase Physical Activity;Increase Strength and Stamina;Able to understand and use rate of perceived exertion (RPE) scale;Able to understand and use Dyspnea scale;Knowledge and  understanding of Target Heart Rate Range (THRR);Understanding of Exercise Prescription Increase Physical Activity;Increase Strength and Stamina;Able to understand and use rate of perceived exertion (RPE) scale;Able to understand and use Dyspnea scale;Knowledge and understanding of Target Heart Rate Range (THRR);Able to check pulse independently;Understanding of Exercise Prescription Increase Physical Activity;Increase Strength and Stamina;Able to understand and use rate of perceived exertion (RPE) scale;Able to understand and use Dyspnea scale;Knowledge and understanding of Target Heart Rate Range (THRR);Understanding of Exercise Prescription    Comments Pt has completed 4 sesisons of pulmonary rehab. Pt is deconditioned ans will progress slowly. He is motivated to decrease his use of oxygen but on his "bad" days and when he feels SOB he will increase his oxygen from 4L to 6L. He is currently exercising at 2.0 METs on the AE. Will continue to monitor and progress as able. Pt has completed 10 seesions of pulmonary rehab. Pt is increasing his workload and is motivated to decrease his oxygen. His is still increasing his oxygen to 6L and using inhaler during exercise. He is currently exercising at 3.1 METs on the AE. Will continue to monitor and progress as able. Pt has completed 16 sessions of pulmonary rehab. Pt is slowly starting to increase his workload but does not push himself. He still increases his oxygen to 6 liters while in class when his chart says 4 liters. He is currently exercising at 3.1 METs on the AE. Will continue to monitor and progress as able. Pt has completed 21 sessions of cardiac rehab. He continues to slowly progress his workload and has started to walk on the treadmill. His oxygen remians at 6 liters with his oxygen 100%. He is currently exercising at 3.3 METs on the arm ergometer. Will continue to monitor and progress as able.    Expected Outcomes Through exercise at rehab and at home, the  patient will meet their stated goals. Through exercise at rehab and at home, the patient will meet their stated goals. Through exercise at rehab and at home, the patient will meet their stated goals. Through exercise at rehab and at home, the patient will meet their stated goals.             Discharge Exercise Prescription (Final Exercise Prescription Changes):  Exercise Prescription Changes - 10/07/21 1200       Response to Exercise   Blood  Pressure (Admit) 114/72    Blood Pressure (Exercise) 150/80    Blood Pressure (Exit) 128/72    Heart Rate (Admit) 89 bpm    Heart Rate (Exercise) 104 bpm    Heart Rate (Exit) 92 bpm    Oxygen Saturation (Admit) 95 %    Oxygen Saturation (Exercise) 98 %    Oxygen Saturation (Exit) 100 %    Rating of Perceived Exertion (Exercise) 14    Perceived Dyspnea (Exercise) 12    Duration Continue with 30 min of aerobic exercise without signs/symptoms of physical distress.    Intensity THRR unchanged      Progression   Progression Continue to progress workloads to maintain intensity without signs/symptoms of physical distress.      Resistance Training   Training Prescription Yes    Weight 3    Reps 10-15    Time 10 Minutes      Oxygen   Oxygen Continuous    Liters 6      Treadmill   MPH 1.1    Grade 0    Minutes 22    METs 1.84      Arm Ergometer   Level 3    RPM 28    Minutes 17    METs 3.3             Nutrition:  Target Goals: Understanding of nutrition guidelines, daily intake of sodium 1500mg , cholesterol 200mg , calories 30% from fat and 7% or less from saturated fats, daily to have 5 or more servings of fruits and vegetables.  Biometrics:  Pre Biometrics - 07/05/21 1408       Pre Biometrics   Height 5' 6.5" (1.689 m)    Weight 80.2 kg    Waist Circumference 44 inches    Hip Circumference 42.5 inches    Waist to Hip Ratio 1.04 %    BMI (Calculated) 28.11    Triceps Skinfold 20 mm    % Body Fat 31 %    Grip  Strength 37.5 kg    Flexibility 4.5 in    Single Leg Stand 2.72 seconds              Nutrition Therapy Plan and Nutrition Goals:  Nutrition Therapy & Goals - 08/10/21 1411       Personal Nutrition Goals   Comments We offer 2 educational sessions regarding heart healthy nutrition with handouts and assistance with RD referral if patient is interested.  Handout provided and explained regarding healthier choices and assesment discussed.  Patient verbalized understanding.      Intervention Plan   Intervention Nutrition handout(s) given to patient.    Expected Outcomes Short Term Goal: Understand basic principles of dietary content, such as calories, fat, sodium, cholesterol and nutrients.             Nutrition Assessments:  Nutrition Assessments - 07/05/21 1347       MEDFICTS Scores   Pre Score 57            MEDIFICTS Score Key: ?70 Need to make dietary changes  40-70 Heart Healthy Diet ? 40 Therapeutic Level Cholesterol Diet   Picture Your Plate Scores: [id number] Unhealthy dietary pattern with much room for improvement. 41-50 Dietary pattern unlikely to meet recommendations for good health and room for improvement. 51-60 More healthful dietary pattern, with some room for improvement.  >60 Healthy dietary pattern, although there may be some specific behaviors that could be improved.    Nutrition Goals Re-Evaluation:  Nutrition Goals Discharge (Final Nutrition Goals Re-Evaluation):   Psychosocial: Target Goals: Acknowledge presence or absence of significant depression and/or stress, maximize coping skills, provide positive support system. Participant is able to verbalize types and ability to use techniques and skills needed for reducing stress and depression.  Initial Review & Psychosocial Screening:  Initial Psych Review & Screening - 07/05/21 1405       Initial Review   Current issues with Current Sleep Concerns   PTSD     Family Dynamics   Good Support  System? Yes      Barriers   Psychosocial barriers to participate in program There are no identifiable barriers or psychosocial needs.   No psychosocial barriers identified but does have psychosocial needs.     Screening Interventions   Interventions Encouraged to exercise    Expected Outcomes Short Term goal: Identification and review with participant of any Quality of Life or Depression concerns found by scoring the questionnaire.;Long Term goal: The participant improves quality of Life and PHQ9 Scores as seen by post scores and/or verbalization of changes             Quality of Life Scores:  Quality of Life - 07/05/21 1403       Quality of Life   Select Quality of Life      Quality of Life Scores   Health/Function Pre 18.9 %    Socioeconomic Pre 19.08 %    Psych/Spiritual Pre 29.14 %    Family Pre 14.25 %    GLOBAL Pre 20.59 %            Scores of 19 and below usually indicate a poorer quality of life in these areas.  A difference of  2-3 points is a clinically meaningful difference.  A difference of 2-3 points in the total score of the Quality of Life Index has been associated with significant improvement in overall quality of life, self-image, physical symptoms, and general health in studies assessing change in quality of life.   PHQ-9: Review Flowsheet  More data exists      07/05/2021 03/04/2021 10/23/2017 07/13/2017 05/23/2017  Depression screen PHQ 2/9  Decreased Interest 1 0 0 0 0  Down, Depressed, Hopeless 1 2 0 1 0  PHQ - 2 Score 2 2 0 1 0  Altered sleeping 2 2 - 1 -  Tired, decreased energy 2 3 - 0 -  Change in appetite 1 2 - 0 -  Feeling bad or failure about yourself  0 0 - 0 -  Trouble concentrating 1 0 - 0 -  Moving slowly or fidgety/restless 0 1 - 0 -  Suicidal thoughts 0 0 - 0 -  PHQ-9 Score 8 10 - 2 -  Difficult doing work/chores Somewhat difficult Somewhat difficult - Not difficult at all -   Interpretation of Total Score  Total Score Depression  Severity:  1-4 = Minimal depression, 5-9 = Mild depression, 10-14 = Moderate depression, 15-19 = Moderately severe depression, 20-27 = Severe depression   Psychosocial Evaluation and Intervention:  Psychosocial Evaluation - 07/05/21 1408       Psychosocial Evaluation & Interventions   Interventions Encouraged to exercise with the program and follow exercise prescription    Comments Patient has no psychosocial barriers identified. 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 His PTSD is managed with Duloxetine 30 mg and Mirtazepine 30 mg and he sees a psychriatist at the [place] monthly. He  says he also talks to his pastor a lot about his emotional needs. He feels his PTSD is managed at this time but is an ongoing issue for him. He says he still has frequent bad dreams about [place] and this along with wearing a CPAP often disrupts his sleep but he is able to cope. He has 3 daughters, 2 that he is close with and multiple grandchildren and 3 sisters. He says all of his family support him and would help him with anything he needed. His initial PHQ-9 score was 8 related mainly to his SOB limiting his activity. He is ready to start the program hoping to get his lungs stronger enabling him to have lung reduction surgery.    Expected Outcomes Patient will continue to have no psychosocial barriers identified and his PTSD will continue to be managed.    Continue Psychosocial Services  No Follow up required             Psychosocial Re-Evaluation:  Psychosocial Re-Evaluation     [place] Name 07/12/21 1439 08/10/21 1403 09/06/21 0756 10/04/21 1507 10/08/21 1025     Psychosocial Re-Evaluation   Current issues with Current Psychotropic Meds;Current Stress Concerns Current Psychotropic Meds;Current Stress Concerns Current Psychotropic Meds;Current Stress Concerns Current Psychotropic Meds;Current Stress Concerns Current Psychotropic Meds;Current Stress Concerns   Comments Patient  is new to program (starting over) completing 1 sessions.  His initial QOL score is 19.58 and his PHQ-9 score is 8.  He seems to enjoy coming to program and interacts well with class and staff.  He was referred to pulmonary rehab via Dr. Spero Curb with COPD.  Patient has a histroy of  PTSD and he on psychotropic meds, also is managed  with a consoler.   We will continue to monitor and  he will continue to have no psychosocial issuses. Patient is completing 8 sessions.   He seems to enjoy coming to program and interacts well with class and staff.  He was referred to pulmonary rehab via Dr. Spero Curb with COPD.  Patient has a histroy of  PTSD and he on psychotropic meds, also is managed  with a consoler. He demonstrate a positvie outlook.  We will continue to monitor as he progresses and he will continue to have no psychosocial issuses. Patient has completed 15 sessions.   He seems to enjoy coming to program and interacts well with class and staff.   Patient has a histroy of  PTSD and he on psychotropic meds, which is  managed  with medication. He demonstrate a positvie outlook.  We will continue to monitor as he progresses and he will continue to have no psychosocial issuses. Patient has completed 20 sessions.   He seems to enjoy coming to program and interacts well with class and staff.   Patient has a histroy of  PTSD and he on psychotropic meds, which is  managed  with medication. He demonstrate a positvie outlook.  We will continue to monitor as he progresses and he will continue to have no psychosocial issuses. Patient has completed 22 sessions.   He seems to enjoy coming to program and interacts well with class and staff.   Patient has a history of  PTSD, but is controlled and managed with psychotropic medications. He demonstrate a positvie outlook and is looking forward to possible having lung surgery.  We will continue to monitor as he progresses and he will continue to have no psychosocial issuses.    Expected Outcomes Patient will have no  psychosocial barriers identified at discharge. Patient will have no psychosocial barriers identified at discharge. Patient will have no psychosocial barriers identified at discharge. Patient will have no psychosocial barriers identified at discharge. Patient will have no psychosocial barriers identified at discharge.   Interventions Stress management education;Relaxation education;Encouraged to attend Pulmonary Rehabilitation for the exercise Stress management education;Relaxation education;Encouraged to attend Pulmonary Rehabilitation for the exercise Stress management education;Relaxation education;Encouraged to attend Pulmonary Rehabilitation for the exercise Stress management education;Relaxation education;Encouraged to attend Pulmonary Rehabilitation for the exercise Stress management education;Relaxation education;Encouraged to attend Pulmonary Rehabilitation for the exercise   Continue Psychosocial Services  No Follow up required No Follow up required No Follow up required No Follow up required No Follow up required     Initial Review   Source of Stress Concerns -- -- None Identified None Identified None Identified            Psychosocial Discharge (Final Psychosocial Re-Evaluation):  Psychosocial Re-Evaluation - 10/08/21 1025       Psychosocial Re-Evaluation   Current issues with Current Psychotropic Meds;Current Stress Concerns    Comments Patient has completed 22 sessions.   He seems to enjoy coming to program and interacts well with class and staff.   Patient has a history of  PTSD, but is controlled and managed with psychotropic medications. He demonstrate a positvie outlook and is looking forward to possible having lung surgery.  We will continue to monitor as he progresses and he will continue to have no psychosocial issuses.    Expected Outcomes Patient will have no psychosocial barriers identified at discharge.    Interventions Stress  management education;Relaxation education;Encouraged to attend Pulmonary Rehabilitation for the exercise    Continue Psychosocial Services  No Follow up required      Initial Review   Source of Stress Concerns None Identified              Education: Education Goals: Education classes will be provided on a weekly basis, covering required topics. Participant will state understanding/return demonstration of topics presented.  Learning Barriers/Preferences:   Education Topics: How Lungs Work and Diseases: - Discuss the anatomy of the lungs and diseases that can affect the lungs, such as COPD.   Exercise: -Discuss the importance of exercise, FITT principles of exercise, normal and abnormal responses to exercise, and how to exercise safely.   Environmental Irritants: -Discuss types of environmental irritants and how to limit exposure to environmental irritants. Flowsheet Row PULMONARY REHAB CHRONIC OBSTRUCTIVE PULMONARY DISEASE from 10/07/2021 in [place] [facility]  Date 08/05/21  Educator hj  Instruction Review Code 1- Verbalizes Understanding       Meds/Inhalers and oxygen: - Discuss respiratory medications, definition of an inhaler and oxygen, and the proper way to use an inhaler and oxygen. Flowsheet Row PULMONARY REHAB CHRONIC OBSTRUCTIVE PULMONARY DISEASE from 10/07/2021 in [place] [facility]  Date 08/12/21  Educator hj       Energy Saving Techniques: - Discuss methods to conserve energy and decrease shortness of breath when performing activities of daily living.  Flowsheet Row PULMONARY REHAB CHRONIC OBSTRUCTIVE PULMONARY DISEASE from 10/07/2021 in [place] [facility]  Date 08/19/21  Educator HJ       Bronchial Hygiene / Breathing Techniques: - Discuss breathing mechanics, pursed-lip breathing technique,  proper posture, effective ways to clear airways, and other functional breathing techniques   Cleaning  Equipment: - Provides group verbal and written instruction about the health risks of elevated stress, cause of high stress,  and healthy ways to reduce stress. Flowsheet Row PULMONARY REHAB CHRONIC OBSTRUCTIVE PULMONARY DISEASE from 10/07/2021 in [place]  Date 09/02/21  Educator [place]  Instruction Review Code 1- Verbalizes Understanding       Nutrition I: Fats: - Discuss the types of cholesterol, what cholesterol does to the body, and how cholesterol levels can be controlled. Flowsheet Row PULMONARY REHAB CHRONIC OBSTRUCTIVE PULMONARY DISEASE from 10/07/2021 in [place]  Date 09/09/21  Educator pb  Instruction Review Code 1- Verbalizes Understanding       Nutrition II: Labels: -Discuss the different components of food labels and how to read food labels. Flowsheet Row PULMONARY REHAB CHRONIC OBSTRUCTIVE PULMONARY DISEASE from 10/07/2021 in [place]  Date 09/16/21  Educator DM  Instruction Review Code 1- Verbalizes Understanding       Respiratory Infections: - Discuss the signs and symptoms of respiratory infections, ways to prevent respiratory infections, and the importance of seeking medical treatment when having a respiratory infection. Flowsheet Row PULMONARY REHAB CHRONIC OBSTRUCTIVE PULMONARY DISEASE from 03/25/2021 in [place]  Date 03/25/21  Educator DF  Instruction Review Code 2- Demonstrated Understanding       Stress I: Signs and Symptoms: - Discuss the causes of stress, how stress may lead to anxiety and depression, and ways to limit stress.   Stress II: Relaxation: -Discuss relaxation techniques to limit stress. Flowsheet Row PULMONARY REHAB CHRONIC OBSTRUCTIVE PULMONARY DISEASE from 10/07/2021 in [place]  Date 07/08/21  Educator [place]  Instruction Review Code 1- Verbalizes Understanding       Oxygen for Home/Travel: - Discuss how to  prepare for travel when on oxygen and proper ways to transport and store oxygen to ensure safety.   Knowledge Questionnaire Score:  Knowledge Questionnaire Score - 07/05/21 1349       Knowledge Questionnaire Score   Pre Score 12/18             Core Components/Risk Factors/Patient Goals at Admission:  Personal Goals and Risk Factors at Admission - 07/05/21 1350       Core Components/Risk Factors/Patient Goals on Admission    Weight Management Weight Maintenance    Improve shortness of breath with ADL's Yes    Intervention Provide education, individualized exercise plan and daily activity instruction to help decrease symptoms of SOB with activities of daily living.    Expected Outcomes Short Term: Improve cardiorespiratory fitness to achieve a reduction of symptoms when performing ADLs;Long Term: Be able to perform more ADLs without symptoms or delay the onset of symptoms    Personal Goal Other Yes    Personal Goal Patient wants to strength his lungs getting ready to have lung reduction surgery. He would like to improve his breathing overall and lower oxygen liter flow to 3 to 4 L/M.    Intervention Paitent will attend the PR program with exercise and education and supplement with exercise at home.    Expected Outcomes Patient complete the program meeting both personal and program goals.             Core Components/Risk Factors/Patient Goals Review:   Goals and Risk Factor Review     Row Name 07/12/21 1454 08/10/21 1414 09/06/21 0804 10/04/21 1510       Core Components/Risk Factors/Patient Goals Review   Personal Goals Review Improve shortness of breath with ADL's;Increase knowledge of respiratory medications and ability to use respiratory devices properly.;Develop more efficient breathing techniques such as purse  lipped breathing and diaphragmatic breathing and practicing self-pacing with activity. Improve shortness of breath with ADL's;Increase knowledge of respiratory  medications and ability to use respiratory devices properly.;Develop more efficient breathing techniques such as purse lipped breathing and diaphragmatic breathing and practicing self-pacing with activity. Improve shortness of breath with ADL's;Increase knowledge of respiratory medications and ability to use respiratory devices properly.;Develop more efficient breathing techniques such as purse lipped breathing and diaphragmatic breathing and practicing self-pacing with activity. Improve shortness of breath with ADL's;Increase knowledge of respiratory medications and ability to use respiratory devices properly.;Develop more efficient breathing techniques such as purse lipped breathing and diaphragmatic breathing and practicing self-pacing with activity.    Review Patient is new to program attending 1 sessions (also he is starting over again). He is referred to PR by Dr. Shelburne with COPD .  His personal goals for the program are to get stronger, improve SOB with activity, breath better and get lungs stronger for lung reduction surgery.   We will continue to monitor his progress as he works toward meeting these goals.  O2 Sat during exercise averages at 100% with O2  in progress at 6L via nasal cannul.  We will encouage him to decrease  liters of oxygen while he works toward meeting his goals. Patient has completed 8 sessions. Maintiaining his weight at 80.5kg.  He continues to use Oxygen at 6 L via nasal cannule.  He as been instructed several time to try and decrease his oxygen level. O2 sat average during exercise at 97%-100% .  We will encouage him to decrease  liters of oxygen while he works toward meeting his goals.  His personal goals for the program are to get stronger, improve SOB with activity, breath better and get lungs stronger for lung reduction surgery.   We will continue to monitor his progress as he works toward meeting these goals. We will encouage him to decrease  liters of oxygen while he works  toward meeting his goals. Patient has completed 15 sessions. Maintiaining his weight at 80.2kg.  He continues to use Oxygen at 6 L via nasal cannule during class sessions and tolerates well.  He as been instructed several time to try and decrease his oxygen level. O2 Sats average during exercise at 97%-100% on 4-5 liters .  We will continue to encouage him to decrease  liters of oxygen while he works toward meeting his goals.  His personal goals for the program are to get stronger, improve SOB with activity, breath better and get lungs stronger for lung reduction surgery.   We will continue to monitor his progress as he works toward meeting these goals. We will encouage him to decrease  liters of oxygen while he works toward meeting his goals. Patient has completed 20 sessions. Maintiaining his weight at 80.6kg.  He continues to use Oxygen at 6 L/M via nasal cannule during class sessions and tolerates well.  He as been instructed several time to try and decrease his oxygen level. O2 Sats average during exercise at 97%-100% on 4-5 liters .  We will continue to encouage him to decrease  liters of oxygen while he works toward meeting his goals.  His personal goals for the program are to get stronger, improve SOB with activity, breathe better and get lungs stronger for lung reduction surgery.   We will continue to monitor his progress as he works toward meeting these goals. We will encouage him to decrease  liters of oxygen while he works toward meeting   his goals.    Expected Outcomes Patient will complete the PR meeting both program and personal goals. Patient will complete the PR meeting both program and personal goals. Patient will complete the PR meeting both program and personal goals. Patient will complete the PR meeting both program and personal goals.             Core Components/Risk Factors/Patient Goals at Discharge (Final Review):   Goals and Risk Factor Review - 10/04/21 1510       Core  Components/Risk Factors/Patient Goals Review   Personal Goals Review Improve shortness of breath with ADL's;Increase knowledge of respiratory medications and ability to use respiratory devices properly.;Develop more efficient breathing techniques such as purse lipped breathing and diaphragmatic breathing and practicing self-pacing with activity.    Review Patient has completed 20 sessions. Maintiaining his weight at 80.6kg.  He continues to use Oxygen at 6 L/M via nasal cannule during class sessions and tolerates well.  He as been instructed several time to try and decrease his oxygen level. O2 Sats average during exercise at 97%-100% on 4-5 liters .  We will continue to encouage him to decrease  liters of oxygen while he works toward meeting his goals.  His personal goals for the program are to get stronger, improve SOB with activity, breathe better and get lungs stronger for lung reduction surgery.   We will continue to monitor his progress as he works toward meeting these goals. We will encouage him to decrease  liters of oxygen while he works toward meeting his goals.    Expected Outcomes Patient will complete the PR meeting both program and personal goals.             ITP Comments:   Comments: ITP REVIEW Pt is making expected progress toward pulmonary rehab goals after completing 22 sessions. Recommend continued exercise, life style modification, education, and utilization of breathing techniques to increase stamina and strength and decrease shortness of breath with exertion.

## 2021-10-13 NOTE — Progress Notes (Signed)
Pulmonary Individual Treatment Plan  Patient Details  Name: Chesterfield Mercadel MRN: [id number] Date of Birth: 1951-03-05 Referring Provider:   Flowsheet Row PULMONARY REHAB COPD ORIENTATION from 07/05/2021 in [place]  Referring Provider Dr. Spero Curb       Initial Encounter Date:  Flowsheet Row PULMONARY REHAB COPD ORIENTATION from 07/05/2021 in [place]  Date 07/05/21       Visit Diagnosis: Chronic obstructive pulmonary disease, unspecified COPD type ([place])  Patient's Home Medications on Admission:   Current Outpatient Medications:    acetaminophen (TYLENOL) 500 MG tablet, Take 1,000 mg by mouth every 6 (six) hours as needed for mild pain, fever or headache., Disp: , Rfl:    albuterol (PROVENTIL) (2.5 MG/3ML) 0.083% nebulizer solution, Take 2.5 mg by nebulization every 6 (six) hours as needed for wheezing or shortness of breath., Disp: , Rfl:    atorvastatin (LIPITOR) 80 MG tablet, Take 80 mg by mouth daily., Disp: , Rfl:    diclofenac Sodium (VOLTAREN) 1 % GEL, Apply 2 g topically 4 (four) times daily., Disp: 150 g, Rfl: 1   DULoxetine (CYMBALTA) 60 MG capsule, TAKE ONE CAPSULE BY MOUTH TWO TIMES A DAY FOR POSTTRAUMATIC STRESS DISORDER AND DEPRESSION, Disp: , Rfl:    fluticasone (FLONASE) 50 MCG/ACT nasal spray, INSTILL 2 SPRAYS INTO EACH NOSTRIL EVERY DAY MAXIMUM 2 SPRAYS IN EACH NOSTRIL DAILY. FOR ALLERGIES, Disp: , Rfl:    furosemide (LASIX) 20 MG tablet, Take 1 tablet (20 mg total) by mouth daily., Disp: 90 tablet, Rfl: 1   glucose blood (COOL BLOOD GLUCOSE TEST STRIPS) test strip, Used to test blood sugar x2 daily---diagnosis code r73.03--for one touch verio flex, Disp: 200 each, Rfl: 3   guaiFENesin (MUCINEX) 600 MG 12 hr tablet, Take 600 mg by mouth 2 (two) times daily as needed., Disp: , Rfl:    ipratropium (ATROVENT) 0.03 % nasal spray, Place 1 spray into both nostrils every 8 (eight) hours as needed for rhinitis., Disp: 30 mL,  Rfl: 5   Ipratropium-Albuterol (COMBIVENT RESPIMAT) 20-100 MCG/ACT AERS respimat, Inhale 1 puff into the lungs every 6 (six) hours., Disp: 4 g, Rfl: 6   Lancets MISC, Used to test blood sugar x2 daily---diagnosis code r73.03--for one touch verio flex, Disp: 200 each, Rfl: 3   losartan (COZAAR) 50 MG tablet, Take 1 tablet (50 mg total) by mouth daily., Disp: 90 tablet, Rfl: 1   metoprolol succinate (TOPROL-XL) 50 MG 24 hr tablet, Take 1 tablet (50 mg total) by mouth daily. Take with or immediately following a meal., Disp: 90 tablet, Rfl: 1   mirtazapine (REMERON) 30 MG tablet, Take 30 mg by mouth at bedtime., Disp: , Rfl:    OXYGEN, Inhale 4-5 L into the lungs continuous., Disp: , Rfl:    SYMBICORT 160-4.5 MCG/ACT inhaler, Inhale 2 puffs into the lungs daily., Disp: , Rfl:    tamsulosin (FLOMAX) 0.4 MG CAPS capsule, Take 0.4 mg by mouth daily., Disp: , Rfl:   Past Medical History: Past Medical History:  Diagnosis Date   CHF (congestive heart failure) ([place])    Emphysema    Erectile dysfunction    Hyperlipemia    Hypertension    Polysubstance abuse ([place])    stopped using crack/cocaine December 4th 20-14   PTSD (post-traumatic stress disorder)    Sleep apnea     Tobacco Use: Social History   Tobacco Use  Smoking Status Former   Packs/day: 1.50   Years: 50.00   Total pack years:  75.00   Types: Cigarettes   Start date: 04/26/1963   Quit date: 09/22/2013   Years since quitting: 8.0  Smokeless Tobacco Never  Tobacco Comments   patient using the patch, losenges, and medication    Labs: Review Flowsheet  More data exists      Latest Ref Rng & Units 01/01/2016 05/07/2019 10/29/2020 10/30/2020  Labs for ITP Cardiac and Pulmonary Rehab  Cholestrol 0 - 200 mg/dL - 218  244  -  LDL (calc) 0 - 99 mg/dL - 142  147  -  HDL-C >40 mg/dL - 59  79  -  Trlycerides <150 mg/dL - 84  92  -  Hemoglobin A1c 4.8 - 5.6 % 6.2  6.1  - -  PH, Arterial 7.350 - 7.450 - - - -  PCO2 arterial 32.0 - 48.0 mmHg -  - - -  Bicarbonate 20.0 - 28.0 mmol/L - - - 30.1  30.5   TCO2 22 - 32 mmol/L 22 - 32 mmol/L - - - 32  32   O2 Saturation % - - - 98.0  64.0       10/31/2020  Labs for ITP Cardiac and Pulmonary Rehab  Cholestrol -  LDL (calc) -  HDL-C -  Trlycerides -  Hemoglobin A1c -  PH, Arterial 7.371   PCO2 arterial 50.2   Bicarbonate 28.3   TCO2 -  O2 Saturation 91.8     Capillary Blood Glucose: Lab Results  Component Value Date   GLUCAP 145 (H) 10/30/2020   GLUCAP 97 10/24/2018   GLUCAP 95 07/16/2014   GLUCAP 87 07/16/2014   GLUCAP 89 07/15/2014     Pulmonary Assessment Scores:  Pulmonary Assessment Scores     Row Name 07/05/21 1353         ADL UCSD   ADL Phase Entry     SOB Score total 70     Rest 4     Walk 3     Stairs 2     Bath 3     Dress 3     Shop 3       CAT Score   CAT Score 24       mMRC Score   mMRC Score 3             UCSD: Self-administered rating of dyspnea associated with activities of daily living (ADLs) 6-point scale (0 = "not at all" to 5 = "maximal or unable to do because of breathlessness")  Scoring Scores range from 0 to 120.  Minimally important difference is 5 units  CAT: CAT can identify the health impairment of COPD patients and is better correlated with disease progression.  CAT has a scoring range of zero to 40. The CAT score is classified into four groups of low (less than 10), medium (10 - 20), high (21-30) and very high (31-40) based on the impact level of disease on health status. A CAT score over 10 suggests significant symptoms.  A worsening CAT score could be explained by an exacerbation, poor medication adherence, poor inhaler technique, or progression of COPD or comorbid conditions.  CAT MCID is 2 points  mMRC: mMRC (Modified Medical Research Council) Dyspnea Scale is used to assess the degree of baseline functional disability in patients of respiratory disease due to dyspnea. No minimal important difference is  established. A decrease in score of 1 point or greater is considered a positive change.   Pulmonary Function Assessment:   Exercise Target Goals: Exercise  Program Goal: Individual exercise prescription set using results from initial 6 min walk test and THRR while considering  patient's activity barriers and safety.   Exercise Prescription Goal: Initial exercise prescription builds to 30-45 minutes a day of aerobic activity, 2-3 days per week.  Home exercise guidelines will be given to patient during program as part of exercise prescription that the participant will acknowledge.  Activity Barriers & Risk Stratification:  Activity Barriers & Cardiac Risk Stratification - 07/05/21 1420       Activity Barriers & Cardiac Risk Stratification   Activity Barriers Deconditioning;Shortness of Breath    Cardiac Risk Stratification High             6 Minute Walk:  6 Minute Walk     Row Name 07/05/21 1404         6 Minute Walk   Phase Initial     Distance 925 feet     Walk Time 6 minutes     # of Rest Breaks 1     MPH 1.75     METS 2.54     RPE 17     Perceived Dyspnea  13     VO2 Peak 8.9     Symptoms Yes (comment)     Comments 1 standing rest break for 30 sec due to SOB     Resting HR 84 bpm     Resting BP 144/88     Resting Oxygen Saturation  100 %     Exercise Oxygen Saturation  during 6 min walk 93 %     Max Ex. HR 113 bpm     Max Ex. BP 150/98     2 Minute Post BP 140/98       Interval HR   1 Minute HR 93     2 Minute HR 95     3 Minute HR 100     4 Minute HR 107     5 Minute HR 113     6 Minute HR 110     2 Minute Post HR 97     Interval Heart Rate? Yes       Interval Oxygen   Interval Oxygen? Yes     Baseline Oxygen Saturation % 100 %     1 Minute Oxygen Saturation % 99 %     1 Minute Liters of Oxygen 4 L     2 Minute Oxygen Saturation % 95 %     2 Minute Liters of Oxygen 4 L     3 Minute Oxygen Saturation % 97 %     3 Minute Liters of Oxygen 4 L      4 Minute Oxygen Saturation % 96 %     4 Minute Liters of Oxygen 4 L     5 Minute Oxygen Saturation % 94 %     5 Minute Liters of Oxygen 4 L     6 Minute Oxygen Saturation % 93 %     6 Minute Liters of Oxygen 4 L     2 Minute Post Oxygen Saturation % 99 %     2 Minute Post Liters of Oxygen 4 L              Oxygen Initial Assessment:  Oxygen Initial Assessment - 07/05/21 1422       Home Oxygen   Home Oxygen Device E-Tanks;Home Concentrator    Sleep Oxygen Prescription CPAP    Liters per minute 0  Home Exercise Oxygen Prescription Continuous    Liters per minute 5    Home Resting Oxygen Prescription Continuous    Liters per minute 4    Compliance with Home Oxygen Use Yes      Initial 6 min Walk   Oxygen Used Continuous      Program Oxygen Prescription   Program Oxygen Prescription Continuous      Intervention   Short Term Goals To learn and demonstrate proper pursed lip breathing techniques or other breathing techniques. ;To learn and understand importance of monitoring SPO2 with pulse oximeter and demonstrate accurate use of the pulse oximeter.;To learn and exhibit compliance with exercise, home and travel O2 prescription;To learn and understand importance of maintaining oxygen saturations>88%    Long  Term Goals Exhibits compliance with exercise, home  and travel O2 prescription;Verbalizes importance of monitoring SPO2 with pulse oximeter and return demonstration;Maintenance of O2 saturations>88%;Exhibits proper breathing techniques, such as pursed lip breathing or other method taught during program session             Oxygen Re-Evaluation:  Oxygen Re-Evaluation     Row Name 07/20/21 1252 08/17/21 1253 10/12/21 1237         Program Oxygen Prescription   Program Oxygen Prescription Continuous Continuous Continuous     Liters per minute 4 4 6        Home Oxygen   Home Oxygen Device E-Tanks;Home Concentrator E-Tanks;Home Concentrator E-Tanks;Home Concentrator      Sleep Oxygen Prescription CPAP CPAP CPAP     Liters per minute 0 0 0     Home Exercise Oxygen Prescription Continuous Continuous Continuous     Liters per minute 5 5 5      Home Resting Oxygen Prescription Continuous Continuous --     Liters per minute 4 4 4      Compliance with Home Oxygen Use Yes Yes Yes       Goals/Expected Outcomes   Short Term Goals To learn and demonstrate proper pursed lip breathing techniques or other breathing techniques. ;To learn and exhibit compliance with exercise, home and travel O2 prescription;To learn and understand importance of maintaining oxygen saturations>88%;To learn and understand importance of monitoring SPO2 with pulse oximeter and demonstrate accurate use of the pulse oximeter. To learn and demonstrate proper pursed lip breathing techniques or other breathing techniques. ;To learn and exhibit compliance with exercise, home and travel O2 prescription;To learn and understand importance of maintaining oxygen saturations>88%;To learn and understand importance of monitoring SPO2 with pulse oximeter and demonstrate accurate use of the pulse oximeter. To learn and demonstrate proper pursed lip breathing techniques or other breathing techniques. ;To learn and exhibit compliance with exercise, home and travel O2 prescription;To learn and understand importance of maintaining oxygen saturations>88%;To learn and understand importance of monitoring SPO2 with pulse oximeter and demonstrate accurate use of the pulse oximeter.     Long  Term Goals Exhibits compliance with exercise, home  and travel O2 prescription;Verbalizes importance of monitoring SPO2 with pulse oximeter and return demonstration;Maintenance of O2 saturations>88%;Exhibits proper breathing techniques, such as pursed lip breathing or other method taught during program session Exhibits compliance with exercise, home  and travel O2 prescription;Verbalizes importance of monitoring SPO2 with pulse oximeter and  return demonstration;Maintenance of O2 saturations>88%;Exhibits proper breathing techniques, such as pursed lip breathing or other method taught during program session Exhibits compliance with exercise, home  and travel O2 prescription;Verbalizes importance of monitoring SPO2 with pulse oximeter and return demonstration;Maintenance of O2 saturations>88%;Exhibits proper breathing techniques,  such as pursed lip breathing or other method taught during program session     Goals/Expected Outcomes compliance compliance compliance              Oxygen Discharge (Final Oxygen Re-Evaluation):  Oxygen Re-Evaluation - 10/12/21 1237       Program Oxygen Prescription   Program Oxygen Prescription Continuous    Liters per minute 6      Home Oxygen   Home Oxygen Device E-Tanks;Home Concentrator    Sleep Oxygen Prescription CPAP    Liters per minute 0    Home Exercise Oxygen Prescription Continuous    Liters per minute 5    Liters per minute 4    Compliance with Home Oxygen Use Yes      Goals/Expected Outcomes   Short Term Goals To learn and demonstrate proper pursed lip breathing techniques or other breathing techniques. ;To learn and exhibit compliance with exercise, home and travel O2 prescription;To learn and understand importance of maintaining oxygen saturations>88%;To learn and understand importance of monitoring SPO2 with pulse oximeter and demonstrate accurate use of the pulse oximeter.    Long  Term Goals Exhibits compliance with exercise, home  and travel O2 prescription;Verbalizes importance of monitoring SPO2 with pulse oximeter and return demonstration;Maintenance of O2 saturations>88%;Exhibits proper breathing techniques, such as pursed lip breathing or other method taught during program session    Goals/Expected Outcomes compliance             Initial Exercise Prescription:  Initial Exercise Prescription - 07/05/21 1400       Date of Initial Exercise RX and Referring Provider    Date 07/05/21    Referring Provider Dr. Spero Curb    Expected Discharge Date 11/09/21      Oxygen   Oxygen Continuous    Liters 4    Maintain Oxygen Saturation 88% or higher      NuStep   Level 1    SPM 80    Minutes 22      Arm Ergometer   Level 1    RPM 60    Minutes 17      Prescription Details   Frequency (times per week) 2    Duration Progress to 30 minutes of continuous aerobic without signs/symptoms of physical distress      Intensity   THRR 40-80% of Max Heartrate 60-121    Ratings of Perceived Exertion 11-13    Perceived Dyspnea 0-4      Resistance Training   Training Prescription Yes    Weight 3    Reps 10-15             Perform Capillary Blood Glucose checks as needed.  Exercise Prescription Changes:   Exercise Prescription Changes     Row Name 07/20/21 1200 08/03/21 1200 08/17/21 1200 08/31/21 1200 09/14/21 1300     Response to Exercise   Blood Pressure (Admit) 132/80 152/83 138/70 126/70 130/68   Blood Pressure (Exercise) 140/78 132/74 146/70 168/80 160/60   Blood Pressure (Exit) 116/72 130/70 136/80 128/76 140/82   Heart Rate (Admit) 94 bpm 93 bpm 87 bpm 98 bpm 103 bpm   Heart Rate (Exercise) 102 bpm 101 bpm 100 bpm 105 bpm 107 bpm   Heart Rate (Exit) 83 bpm 92 bpm 87 bpm 99 bpm 95 bpm   Oxygen Saturation (Admit) 98 % 96 % 98 % 96 % 97 %   Oxygen Saturation (Exercise) 98 % 98 % 99 % 98 % 98 %   Oxygen Saturation (  Exit) 100 % 100 % 100 % 100 % 99 %   Rating of Perceived Exertion (Exercise) 13 12 15 15 15    Perceived Dyspnea (Exercise) 13 12 12 13 15    Duration Continue with 30 min of aerobic exercise without signs/symptoms of physical distress. Continue with 30 min of aerobic exercise without signs/symptoms of physical distress. Continue with 30 min of aerobic exercise without signs/symptoms of physical distress. Continue with 30 min of aerobic exercise without signs/symptoms of physical distress. Continue with 30 min of aerobic exercise  without signs/symptoms of physical distress.   Intensity THRR unchanged THRR unchanged THRR unchanged THRR unchanged THRR unchanged     Progression   Progression Continue to progress workloads to maintain intensity without signs/symptoms of physical distress. Continue to progress workloads to maintain intensity without signs/symptoms of physical distress. Continue to progress workloads to maintain intensity without signs/symptoms of physical distress. Continue to progress workloads to maintain intensity without signs/symptoms of physical distress. Continue to progress workloads to maintain intensity without signs/symptoms of physical distress.     Resistance Training   Training Prescription Yes Yes Yes Yes Yes   Weight 2 3 3 2 2    Reps 10-15 10-15 10-15 10-15 10-15   Time 10 Minutes 10 Minutes 10 Minutes 10 Minutes 10 Minutes     Oxygen   Oxygen Continuous Continuous Continuous Continuous Continuous   Liters 3 6 6 6 6      NuStep   Level 1 2 3 3 3    SPM 71 82 72 69 76   Minutes 22 22 22 22 22    METs 1.8 1.9 1.9 1.9 1.9     Arm Ergometer   Level 1 1 3 3 3    RPM 60 60 57 59 58   Minutes 17 17 17 17 17    METs 2 1.8 3.1 3.3 3.1    Row Name 09/16/21 1100 09/28/21 1400 10/07/21 1200         Response to Exercise   Blood Pressure (Admit) -- 122/76 114/72     Blood Pressure (Exercise) -- 134/80 150/80     Blood Pressure (Exit) -- 130/82 128/72     Heart Rate (Admit) -- 90 bpm 89 bpm     Heart Rate (Exercise) -- 96 bpm 104 bpm     Heart Rate (Exit) -- 98 bpm 92 bpm     Oxygen Saturation (Admit) -- 98 % 95 %     Oxygen Saturation (Exercise) -- 98 % 98 %     Oxygen Saturation (Exit) -- 99 % 100 %     Rating of Perceived Exertion (Exercise) -- 15 14     Perceived Dyspnea (Exercise) -- 13 12     Duration -- Continue with 30 min of aerobic exercise without signs/symptoms of physical distress. Continue with 30 min of aerobic exercise without signs/symptoms of physical distress.      Intensity -- THRR unchanged THRR unchanged       Progression   Progression -- Continue to progress workloads to maintain intensity without signs/symptoms of physical distress. Continue to progress workloads to maintain intensity without signs/symptoms of physical distress.       Resistance Training   Training Prescription -- Yes Yes     Weight -- 3 3     Reps -- 10-15 10-15     Time -- 10 Minutes 10 Minutes       Oxygen   Oxygen -- Continuous Continuous     Liters -- 6 6  Treadmill   MPH -- -- 1.1     Grade -- -- 0     Minutes -- -- 22     METs -- -- 1.84       NuStep   Level -- 3 --     SPM -- 73 --     Minutes -- 22 --     METs -- 1.9 --       Arm Ergometer   Level -- 4 3     RPM -- 47 28     Minutes -- 17 17     METs -- 4 3.3       Home Exercise Plan   Plans to continue exercise at [employer] (comment) -- --     Frequency Add 1 additional day to program exercise sessions. -- --     Initial Home Exercises Provided 09/16/21 -- --              Exercise Comments:   Exercise Comments     Row Name 09/16/21 1144           Exercise Comments home exercise reviewed                Exercise Goals and Review:   Exercise Goals     Row Name 07/05/21 1407 07/20/21 1249 08/17/21 1250 09/14/21 1314 10/12/21 1233     Exercise Goals   Increase Physical Activity Yes Yes Yes Yes Yes   Intervention Provide advice, education, support and counseling about physical activity/exercise needs.;Develop an individualized exercise prescription for aerobic and resistive training based on initial evaluation findings, risk stratification, comorbidities and participant's personal goals. Provide advice, education, support and counseling about physical activity/exercise needs.;Develop an individualized exercise prescription for aerobic and resistive training based on initial evaluation findings, risk stratification, comorbidities and participant's personal goals. Provide  advice, education, support and counseling about physical activity/exercise needs.;Develop an individualized exercise prescription for aerobic and resistive training based on initial evaluation findings, risk stratification, comorbidities and participant's personal goals. Provide advice, education, support and counseling about physical activity/exercise needs.;Develop an individualized exercise prescription for aerobic and resistive training based on initial evaluation findings, risk stratification, comorbidities and participant's personal goals. Provide advice, education, support and counseling about physical activity/exercise needs.;Develop an individualized exercise prescription for aerobic and resistive training based on initial evaluation findings, risk stratification, comorbidities and participant's personal goals.   Expected Outcomes Short Term: Attend rehab on a regular basis to increase amount of physical activity.;Long Term: Exercising regularly at least 3-5 days a week.;Long Term: Add in home exercise to make exercise part of routine and to increase amount of physical activity. Short Term: Attend rehab on a regular basis to increase amount of physical activity.;Long Term: Exercising regularly at least 3-5 days a week.;Long Term: Add in home exercise to make exercise part of routine and to increase amount of physical activity. Short Term: Attend rehab on a regular basis to increase amount of physical activity.;Long Term: Exercising regularly at least 3-5 days a week.;Long Term: Add in home exercise to make exercise part of routine and to increase amount of physical activity. Short Term: Attend rehab on a regular basis to increase amount of physical activity.;Long Term: Exercising regularly at least 3-5 days a week.;Long Term: Add in home exercise to make exercise part of routine and to increase amount of physical activity. Short Term: Attend rehab on a regular basis to increase amount of physical  activity.;Long Term: Exercising regularly at least 3-5 days a  week.;Long Term: Add in home exercise to make exercise part of routine and to increase amount of physical activity.   Increase Strength and Stamina Yes -- Yes Yes Yes   Intervention Provide advice, education, support and counseling about physical activity/exercise needs.;Develop an individualized exercise prescription for aerobic and resistive training based on initial evaluation findings, risk stratification, comorbidities and participant's personal goals. Provide advice, education, support and counseling about physical activity/exercise needs.;Develop an individualized exercise prescription for aerobic and resistive training based on initial evaluation findings, risk stratification, comorbidities and participant's personal goals. Provide advice, education, support and counseling about physical activity/exercise needs.;Develop an individualized exercise prescription for aerobic and resistive training based on initial evaluation findings, risk stratification, comorbidities and participant's personal goals. Provide advice, education, support and counseling about physical activity/exercise needs.;Develop an individualized exercise prescription for aerobic and resistive training based on initial evaluation findings, risk stratification, comorbidities and participant's personal goals. Provide advice, education, support and counseling about physical activity/exercise needs.;Develop an individualized exercise prescription for aerobic and resistive training based on initial evaluation findings, risk stratification, comorbidities and participant's personal goals.   Expected Outcomes Short Term: Increase workloads from initial exercise prescription for resistance, speed, and METs.;Short Term: Perform resistance training exercises routinely during rehab and add in resistance training at home;Long Term: Improve cardiorespiratory fitness, muscular endurance and  strength as measured by increased METs and functional capacity (6MWT) Short Term: Increase workloads from initial exercise prescription for resistance, speed, and METs.;Short Term: Perform resistance training exercises routinely during rehab and add in resistance training at home;Long Term: Improve cardiorespiratory fitness, muscular endurance and strength as measured by increased METs and functional capacity (6MWT) Short Term: Increase workloads from initial exercise prescription for resistance, speed, and METs.;Short Term: Perform resistance training exercises routinely during rehab and add in resistance training at home;Long Term: Improve cardiorespiratory fitness, muscular endurance and strength as measured by increased METs and functional capacity (6MWT) Short Term: Increase workloads from initial exercise prescription for resistance, speed, and METs.;Short Term: Perform resistance training exercises routinely during rehab and add in resistance training at home;Long Term: Improve cardiorespiratory fitness, muscular endurance and strength as measured by increased METs and functional capacity (6MWT) Short Term: Increase workloads from initial exercise prescription for resistance, speed, and METs.;Short Term: Perform resistance training exercises routinely during rehab and add in resistance training at home;Long Term: Improve cardiorespiratory fitness, muscular endurance and strength as measured by increased METs and functional capacity (6MWT)   Able to understand and use rate of perceived exertion (RPE) scale Yes Yes Yes Yes Yes   Intervention Provide education and explanation on how to use RPE scale Provide education and explanation on how to use RPE scale Provide education and explanation on how to use RPE scale Provide education and explanation on how to use RPE scale Provide education and explanation on how to use RPE scale   Expected Outcomes Short Term: Able to use RPE daily in rehab to express  subjective intensity level;Long Term:  Able to use RPE to guide intensity level when exercising independently Short Term: Able to use RPE daily in rehab to express subjective intensity level;Long Term:  Able to use RPE to guide intensity level when exercising independently Short Term: Able to use RPE daily in rehab to express subjective intensity level;Long Term:  Able to use RPE to guide intensity level when exercising independently Short Term: Able to use RPE daily in rehab to express subjective intensity level;Long Term:  Able to use RPE to guide intensity level when  exercising independently Short Term: Able to use RPE daily in rehab to express subjective intensity level;Long Term:  Able to use RPE to guide intensity level when exercising independently   Able to understand and use Dyspnea scale Yes Yes Yes Yes Yes   Intervention Provide education and explanation on how to use Dyspnea scale Provide education and explanation on how to use Dyspnea scale Provide education and explanation on how to use Dyspnea scale Provide education and explanation on how to use Dyspnea scale Provide education and explanation on how to use Dyspnea scale   Expected Outcomes Short Term: Able to use Dyspnea scale daily in rehab to express subjective sense of shortness of breath during exertion;Long Term: Able to use Dyspnea scale to guide intensity level when exercising independently Short Term: Able to use Dyspnea scale daily in rehab to express subjective sense of shortness of breath during exertion;Long Term: Able to use Dyspnea scale to guide intensity level when exercising independently Short Term: Able to use Dyspnea scale daily in rehab to express subjective sense of shortness of breath during exertion;Long Term: Able to use Dyspnea scale to guide intensity level when exercising independently Short Term: Able to use Dyspnea scale daily in rehab to express subjective sense of shortness of breath during exertion;Long Term: Able  to use Dyspnea scale to guide intensity level when exercising independently Short Term: Able to use Dyspnea scale daily in rehab to express subjective sense of shortness of breath during exertion;Long Term: Able to use Dyspnea scale to guide intensity level when exercising independently   Knowledge and understanding of Target Heart Rate Range (THRR) Yes Yes Yes Yes Yes   Intervention Provide education and explanation of THRR including how the numbers were predicted and where they are located for reference Provide education and explanation of THRR including how the numbers were predicted and where they are located for reference Provide education and explanation of THRR including how the numbers were predicted and where they are located for reference Provide education and explanation of THRR including how the numbers were predicted and where they are located for reference Provide education and explanation of THRR including how the numbers were predicted and where they are located for reference   Expected Outcomes Short Term: Able to state/look up THRR;Long Term: Able to use THRR to govern intensity when exercising independently;Short Term: Able to use daily as guideline for intensity in rehab Short Term: Able to state/look up THRR;Long Term: Able to use THRR to govern intensity when exercising independently;Short Term: Able to use daily as guideline for intensity in rehab Short Term: Able to state/look up THRR;Long Term: Able to use THRR to govern intensity when exercising independently;Short Term: Able to use daily as guideline for intensity in rehab Short Term: Able to state/look up THRR;Long Term: Able to use THRR to govern intensity when exercising independently;Short Term: Able to use daily as guideline for intensity in rehab Short Term: Able to state/look up THRR;Long Term: Able to use THRR to govern intensity when exercising independently;Short Term: Able to use daily as guideline for intensity in rehab    Understanding of Exercise Prescription Yes Yes Yes Yes Yes   Intervention Provide education, explanation, and written materials on patient's individual exercise prescription Provide education, explanation, and written materials on patient's individual exercise prescription Provide education, explanation, and written materials on patient's individual exercise prescription Provide education, explanation, and written materials on patient's individual exercise prescription Provide education, explanation, and written materials on patient's individual exercise prescription  Expected Outcomes Short Term: Able to explain program exercise prescription;Long Term: Able to explain home exercise prescription to exercise independently Short Term: Able to explain program exercise prescription;Long Term: Able to explain home exercise prescription to exercise independently Short Term: Able to explain program exercise prescription;Long Term: Able to explain home exercise prescription to exercise independently Short Term: Able to explain program exercise prescription;Long Term: Able to explain home exercise prescription to exercise independently Short Term: Able to explain program exercise prescription;Long Term: Able to explain home exercise prescription to exercise independently            Exercise Goals Re-Evaluation :  Exercise Goals Re-Evaluation     Row Name 07/20/21 1249 08/17/21 1250 09/14/21 1314 10/12/21 1234       Exercise Goal Re-Evaluation   Exercise Goals Review Increase Physical Activity;Increase Strength and Stamina;Able to understand and use rate of perceived exertion (RPE) scale;Able to understand and use Dyspnea scale;Knowledge and understanding of Target Heart Rate Range (THRR);Understanding of Exercise Prescription Increase Physical Activity;Increase Strength and Stamina;Able to understand and use rate of perceived exertion (RPE) scale;Able to understand and use Dyspnea scale;Knowledge and  understanding of Target Heart Rate Range (THRR);Understanding of Exercise Prescription Increase Physical Activity;Increase Strength and Stamina;Able to understand and use rate of perceived exertion (RPE) scale;Able to understand and use Dyspnea scale;Knowledge and understanding of Target Heart Rate Range (THRR);Able to check pulse independently;Understanding of Exercise Prescription Increase Physical Activity;Increase Strength and Stamina;Able to understand and use rate of perceived exertion (RPE) scale;Able to understand and use Dyspnea scale;Knowledge and understanding of Target Heart Rate Range (THRR);Understanding of Exercise Prescription    Comments Pt has completed 4 sesisons of pulmonary rehab. Pt is deconditioned ans will progress slowly. He is motivated to decrease his use of oxygen but on his "bad" days and when he feels SOB he will increase his oxygen from 4L to 6L. He is currently exercising at 2.0 METs on the AE. Will continue to monitor and progress as able. Pt has completed 10 seesions of pulmonary rehab. Pt is increasing his workload and is motivated to decrease his oxygen. His is still increasing his oxygen to 6L and using inhaler during exercise. He is currently exercising at 3.1 METs on the AE. Will continue to monitor and progress as able. Pt has completed 16 sessions of pulmonary rehab. Pt is slowly starting to increase his workload but does not push himself. He still increases his oxygen to 6 liters while in class when his chart says 4 liters. He is currently exercising at 3.1 METs on the AE. Will continue to monitor and progress as able. Pt has completed 21 sessions of cardiac rehab. He continues to slowly progress his workload and has started to walk on the treadmill. His oxygen remians at 6 liters with his oxygen 100%. He is currently exercising at 3.3 METs on the arm ergometer. Will continue to monitor and progress as able.    Expected Outcomes Through exercise at rehab and at home, the  patient will meet their stated goals. Through exercise at rehab and at home, the patient will meet their stated goals. Through exercise at rehab and at home, the patient will meet their stated goals. Through exercise at rehab and at home, the patient will meet their stated goals.             Discharge Exercise Prescription (Final Exercise Prescription Changes):  Exercise Prescription Changes - 10/07/21 1200       Response to Exercise   Blood  Pressure (Admit) 114/72    Blood Pressure (Exercise) 150/80    Blood Pressure (Exit) 128/72    Heart Rate (Admit) 89 bpm    Heart Rate (Exercise) 104 bpm    Heart Rate (Exit) 92 bpm    Oxygen Saturation (Admit) 95 %    Oxygen Saturation (Exercise) 98 %    Oxygen Saturation (Exit) 100 %    Rating of Perceived Exertion (Exercise) 14    Perceived Dyspnea (Exercise) 12    Duration Continue with 30 min of aerobic exercise without signs/symptoms of physical distress.    Intensity THRR unchanged      Progression   Progression Continue to progress workloads to maintain intensity without signs/symptoms of physical distress.      Resistance Training   Training Prescription Yes    Weight 3    Reps 10-15    Time 10 Minutes      Oxygen   Oxygen Continuous    Liters 6      Treadmill   MPH 1.1    Grade 0    Minutes 22    METs 1.84      Arm Ergometer   Level 3    RPM 28    Minutes 17    METs 3.3             Nutrition:  Target Goals: Understanding of nutrition guidelines, daily intake of sodium 1500mg , cholesterol 200mg , calories 30% from fat and 7% or less from saturated fats, daily to have 5 or more servings of fruits and vegetables.  Biometrics:  Pre Biometrics - 07/05/21 1408       Pre Biometrics   Height 5' 6.5" (1.689 m)    Weight 80.2 kg    Waist Circumference 44 inches    Hip Circumference 42.5 inches    Waist to Hip Ratio 1.04 %    BMI (Calculated) 28.11    Triceps Skinfold 20 mm    % Body Fat 31 %    Grip  Strength 37.5 kg    Flexibility 4.5 in    Single Leg Stand 2.72 seconds              Nutrition Therapy Plan and Nutrition Goals:  Nutrition Therapy & Goals - 08/10/21 1411       Personal Nutrition Goals   Comments We offer 2 educational sessions regarding heart healthy nutrition with handouts and assistance with RD referral if patient is interested.  Handout provided and explained regarding healthier choices and assesment discussed.  Patient verbalized understanding.      Intervention Plan   Intervention Nutrition handout(s) given to patient.    Expected Outcomes Short Term Goal: Understand basic principles of dietary content, such as calories, fat, sodium, cholesterol and nutrients.             Nutrition Assessments:  Nutrition Assessments - 07/05/21 1347       MEDFICTS Scores   Pre Score 57            MEDIFICTS Score Key: ?70 Need to make dietary changes  40-70 Heart Healthy Diet ? 40 Therapeutic Level Cholesterol Diet   Picture Your Plate Scores: [id number] Unhealthy dietary pattern with much room for improvement. 41-50 Dietary pattern unlikely to meet recommendations for good health and room for improvement. 51-60 More healthful dietary pattern, with some room for improvement.  >60 Healthy dietary pattern, although there may be some specific behaviors that could be improved.    Nutrition Goals Re-Evaluation:  Nutrition Goals Discharge (Final Nutrition Goals Re-Evaluation):   Psychosocial: Target Goals: Acknowledge presence or absence of significant depression and/or stress, maximize coping skills, provide positive support system. Participant is able to verbalize types and ability to use techniques and skills needed for reducing stress and depression.  Initial Review & Psychosocial Screening:  Initial Psych Review & Screening - 07/05/21 1405       Initial Review   Current issues with Current Sleep Concerns   PTSD     Family Dynamics   Good Support  System? Yes      Barriers   Psychosocial barriers to participate in program There are no identifiable barriers or psychosocial needs.   No psychosocial barriers identified but does have psychosocial needs.     Screening Interventions   Interventions Encouraged to exercise    Expected Outcomes Short Term goal: Identification and review with participant of any Quality of Life or Depression concerns found by scoring the questionnaire.;Long Term goal: The participant improves quality of Life and PHQ9 Scores as seen by post scores and/or verbalization of changes             Quality of Life Scores:  Quality of Life - 07/05/21 1403       Quality of Life   Select Quality of Life      Quality of Life Scores   Health/Function Pre 18.9 %    Socioeconomic Pre 19.08 %    Psych/Spiritual Pre 29.14 %    Family Pre 14.25 %    GLOBAL Pre 20.59 %            Scores of 19 and below usually indicate a poorer quality of life in these areas.  A difference of  2-3 points is a clinically meaningful difference.  A difference of 2-3 points in the total score of the Quality of Life Index has been associated with significant improvement in overall quality of life, self-image, physical symptoms, and general health in studies assessing change in quality of life.   PHQ-9: Review Flowsheet  More data exists      07/05/2021 03/04/2021 10/23/2017 07/13/2017 05/23/2017  Depression screen PHQ 2/9  Decreased Interest 1 0 0 0 0  Down, Depressed, Hopeless 1 2 0 1 0  PHQ - 2 Score 2 2 0 1 0  Altered sleeping 2 2 - 1 -  Tired, decreased energy 2 3 - 0 -  Change in appetite 1 2 - 0 -  Feeling bad or failure about yourself  0 0 - 0 -  Trouble concentrating 1 0 - 0 -  Moving slowly or fidgety/restless 0 1 - 0 -  Suicidal thoughts 0 0 - 0 -  PHQ-9 Score 8 10 - 2 -  Difficult doing work/chores Somewhat difficult Somewhat difficult - Not difficult at all -   Interpretation of Total Score  Total Score Depression  Severity:  1-4 = Minimal depression, 5-9 = Mild depression, 10-14 = Moderate depression, 15-19 = Moderately severe depression, 20-27 = Severe depression   Psychosocial Evaluation and Intervention:  Psychosocial Evaluation - 07/05/21 1408       Psychosocial Evaluation & Interventions   Interventions Encouraged to exercise with the program and follow exercise prescription    Comments Patient has no psychosocial barriers identified. 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 His PTSD is managed with Duloxetine 30 mg and Mirtazepine 30 mg and he sees a psychriatist at the [place] monthly. He  says he also talks to his pastor a lot about his emotional needs. He feels his PTSD is managed at this time but is an ongoing issue for him. He says he still has frequent bad dreams about [place] and this along with wearing a CPAP often disrupts his sleep but he is able to cope. He has 3 daughters, 2 that he is close with and multiple grandchildren and 3 sisters. He says all of his family support him and would help him with anything he needed. His initial PHQ-9 score was 8 related mainly to his SOB limiting his activity. He is ready to start the program hoping to get his lungs stronger enabling him to have lung reduction surgery.    Expected Outcomes Patient will continue to have no psychosocial barriers identified and his PTSD will continue to be managed.    Continue Psychosocial Services  No Follow up required             Psychosocial Re-Evaluation:  Psychosocial Re-Evaluation     [place] Name 07/12/21 1439 08/10/21 1403 09/06/21 0756 10/04/21 1507 10/08/21 1025     Psychosocial Re-Evaluation   Current issues with Current Psychotropic Meds;Current Stress Concerns Current Psychotropic Meds;Current Stress Concerns Current Psychotropic Meds;Current Stress Concerns Current Psychotropic Meds;Current Stress Concerns Current Psychotropic Meds;Current Stress Concerns   Comments Patient  is new to program (starting over) completing 1 sessions.  His initial QOL score is 19.58 and his PHQ-9 score is 8.  He seems to enjoy coming to program and interacts well with class and staff.  He was referred to pulmonary rehab via Dr. Spero Curb with COPD.  Patient has a histroy of  PTSD and he on psychotropic meds, also is managed  with a consoler.   We will continue to monitor and  he will continue to have no psychosocial issuses. Patient is completing 8 sessions.   He seems to enjoy coming to program and interacts well with class and staff.  He was referred to pulmonary rehab via Dr. Spero Curb with COPD.  Patient has a histroy of  PTSD and he on psychotropic meds, also is managed  with a consoler. He demonstrate a positvie outlook.  We will continue to monitor as he progresses and he will continue to have no psychosocial issuses. Patient has completed 15 sessions.   He seems to enjoy coming to program and interacts well with class and staff.   Patient has a histroy of  PTSD and he on psychotropic meds, which is  managed  with medication. He demonstrate a positvie outlook.  We will continue to monitor as he progresses and he will continue to have no psychosocial issuses. Patient has completed 20 sessions.   He seems to enjoy coming to program and interacts well with class and staff.   Patient has a histroy of  PTSD and he on psychotropic meds, which is  managed  with medication. He demonstrate a positvie outlook.  We will continue to monitor as he progresses and he will continue to have no psychosocial issuses. Patient has completed 22 sessions.   He seems to enjoy coming to program and interacts well with class and staff.   Patient has a history of  PTSD, but is controlled and managed with psychotropic medications. He demonstrate a positvie outlook and is looking forward to possible having lung surgery.  We will continue to monitor as he progresses and he will continue to have no psychosocial issuses.    Expected Outcomes Patient will have no  psychosocial barriers identified at discharge. Patient will have no psychosocial barriers identified at discharge. Patient will have no psychosocial barriers identified at discharge. Patient will have no psychosocial barriers identified at discharge. Patient will have no psychosocial barriers identified at discharge.   Interventions Stress management education;Relaxation education;Encouraged to attend Pulmonary Rehabilitation for the exercise Stress management education;Relaxation education;Encouraged to attend Pulmonary Rehabilitation for the exercise Stress management education;Relaxation education;Encouraged to attend Pulmonary Rehabilitation for the exercise Stress management education;Relaxation education;Encouraged to attend Pulmonary Rehabilitation for the exercise Stress management education;Relaxation education;Encouraged to attend Pulmonary Rehabilitation for the exercise   Continue Psychosocial Services  No Follow up required No Follow up required No Follow up required No Follow up required No Follow up required     Initial Review   Source of Stress Concerns -- -- None Identified None Identified None Identified            Psychosocial Discharge (Final Psychosocial Re-Evaluation):  Psychosocial Re-Evaluation - 10/08/21 1025       Psychosocial Re-Evaluation   Current issues with Current Psychotropic Meds;Current Stress Concerns    Comments Patient has completed 22 sessions.   He seems to enjoy coming to program and interacts well with class and staff.   Patient has a history of  PTSD, but is controlled and managed with psychotropic medications. He demonstrate a positvie outlook and is looking forward to possible having lung surgery.  We will continue to monitor as he progresses and he will continue to have no psychosocial issuses.    Expected Outcomes Patient will have no psychosocial barriers identified at discharge.    Interventions Stress  management education;Relaxation education;Encouraged to attend Pulmonary Rehabilitation for the exercise    Continue Psychosocial Services  No Follow up required      Initial Review   Source of Stress Concerns None Identified              Education: Education Goals: Education classes will be provided on a weekly basis, covering required topics. Participant will state understanding/return demonstration of topics presented.  Learning Barriers/Preferences:   Education Topics: How Lungs Work and Diseases: - Discuss the anatomy of the lungs and diseases that can affect the lungs, such as COPD.   Exercise: -Discuss the importance of exercise, FITT principles of exercise, normal and abnormal responses to exercise, and how to exercise safely.   Environmental Irritants: -Discuss types of environmental irritants and how to limit exposure to environmental irritants. Flowsheet Row PULMONARY REHAB CHRONIC OBSTRUCTIVE PULMONARY DISEASE from 10/07/2021 in [place] [facility]  Date 08/05/21  Educator hj  Instruction Review Code 1- Verbalizes Understanding       Meds/Inhalers and oxygen: - Discuss respiratory medications, definition of an inhaler and oxygen, and the proper way to use an inhaler and oxygen. Flowsheet Row PULMONARY REHAB CHRONIC OBSTRUCTIVE PULMONARY DISEASE from 10/07/2021 in [place] [facility]  Date 08/12/21  Educator hj       Energy Saving Techniques: - Discuss methods to conserve energy and decrease shortness of breath when performing activities of daily living.  Flowsheet Row PULMONARY REHAB CHRONIC OBSTRUCTIVE PULMONARY DISEASE from 10/07/2021 in [place] [facility]  Date 08/19/21  Educator HJ       Bronchial Hygiene / Breathing Techniques: - Discuss breathing mechanics, pursed-lip breathing technique,  proper posture, effective ways to clear airways, and other functional breathing techniques   Cleaning  Equipment: - Provides group verbal and written instruction about the health risks of elevated stress, cause of high stress,  and healthy ways to reduce stress. Flowsheet Row PULMONARY REHAB CHRONIC OBSTRUCTIVE PULMONARY DISEASE from 10/07/2021 in [place]  Date 09/02/21  Educator [place]  Instruction Review Code 1- Verbalizes Understanding       Nutrition I: Fats: - Discuss the types of cholesterol, what cholesterol does to the body, and how cholesterol levels can be controlled. Flowsheet Row PULMONARY REHAB CHRONIC OBSTRUCTIVE PULMONARY DISEASE from 10/07/2021 in [place]  Date 09/09/21  Educator pb  Instruction Review Code 1- Verbalizes Understanding       Nutrition II: Labels: -Discuss the different components of food labels and how to read food labels. Flowsheet Row PULMONARY REHAB CHRONIC OBSTRUCTIVE PULMONARY DISEASE from 10/07/2021 in [place]  Date 09/16/21  Educator DM  Instruction Review Code 1- Verbalizes Understanding       Respiratory Infections: - Discuss the signs and symptoms of respiratory infections, ways to prevent respiratory infections, and the importance of seeking medical treatment when having a respiratory infection. Flowsheet Row PULMONARY REHAB CHRONIC OBSTRUCTIVE PULMONARY DISEASE from 03/25/2021 in [place]  Date 03/25/21  Educator DF  Instruction Review Code 2- Demonstrated Understanding       Stress I: Signs and Symptoms: - Discuss the causes of stress, how stress may lead to anxiety and depression, and ways to limit stress.   Stress II: Relaxation: -Discuss relaxation techniques to limit stress. Flowsheet Row PULMONARY REHAB CHRONIC OBSTRUCTIVE PULMONARY DISEASE from 10/07/2021 in [place]  Date 07/08/21  Educator [place]  Instruction Review Code 1- Verbalizes Understanding       Oxygen for Home/Travel: - Discuss how to  prepare for travel when on oxygen and proper ways to transport and store oxygen to ensure safety.   Knowledge Questionnaire Score:  Knowledge Questionnaire Score - 07/05/21 1349       Knowledge Questionnaire Score   Pre Score 12/18             Core Components/Risk Factors/Patient Goals at Admission:  Personal Goals and Risk Factors at Admission - 07/05/21 1350       Core Components/Risk Factors/Patient Goals on Admission    Weight Management Weight Maintenance    Improve shortness of breath with ADL's Yes    Intervention Provide education, individualized exercise plan and daily activity instruction to help decrease symptoms of SOB with activities of daily living.    Expected Outcomes Short Term: Improve cardiorespiratory fitness to achieve a reduction of symptoms when performing ADLs;Long Term: Be able to perform more ADLs without symptoms or delay the onset of symptoms    Personal Goal Other Yes    Personal Goal Patient wants to strength his lungs getting ready to have lung reduction surgery. He would like to improve his breathing overall and lower oxygen liter flow to 3 to 4 L/M.    Intervention Paitent will attend the PR program with exercise and education and supplement with exercise at home.    Expected Outcomes Patient complete the program meeting both personal and program goals.             Core Components/Risk Factors/Patient Goals Review:   Goals and Risk Factor Review     Row Name 07/12/21 1454 08/10/21 1414 09/06/21 0804 10/04/21 1510       Core Components/Risk Factors/Patient Goals Review   Personal Goals Review Improve shortness of breath with ADL's;Increase knowledge of respiratory medications and ability to use respiratory devices properly.;Develop more efficient breathing techniques such as purse  lipped breathing and diaphragmatic breathing and practicing self-pacing with activity. Improve shortness of breath with ADL's;Increase knowledge of respiratory  medications and ability to use respiratory devices properly.;Develop more efficient breathing techniques such as purse lipped breathing and diaphragmatic breathing and practicing self-pacing with activity. Improve shortness of breath with ADL's;Increase knowledge of respiratory medications and ability to use respiratory devices properly.;Develop more efficient breathing techniques such as purse lipped breathing and diaphragmatic breathing and practicing self-pacing with activity. Improve shortness of breath with ADL's;Increase knowledge of respiratory medications and ability to use respiratory devices properly.;Develop more efficient breathing techniques such as purse lipped breathing and diaphragmatic breathing and practicing self-pacing with activity.    Review Patient is new to program attending 1 sessions (also he is starting over again). He is referred to PR by Dr. Spero Curb with COPD .  His personal goals for the program are to get stronger, improve SOB with activity, breath better and get lungs stronger for lung reduction surgery.   We will continue to monitor his progress as he works toward meeting these goals.  O2 Sat during exercise averages at 100% with O2  in progress at 6L via nasal cannul.  We will encouage him to decrease  liters of oxygen while he works toward meeting his goals. Patient has completed 8 sessions. Maintiaining his weight at 80.5kg.  He continues to use Oxygen at 6 L via nasal cannule.  He as been instructed several time to try and decrease his oxygen level. O2 sat average during exercise at 97%-100% .  We will encouage him to decrease  liters of oxygen while he works toward meeting his goals.  His personal goals for the program are to get stronger, improve SOB with activity, breath better and get lungs stronger for lung reduction surgery.   We will continue to monitor his progress as he works toward meeting these goals. We will encouage him to decrease  liters of oxygen while he works  toward meeting his goals. Patient has completed 15 sessions. Maintiaining his weight at 80.2kg.  He continues to use Oxygen at 6 L via nasal cannule during class sessions and tolerates well.  He as been instructed several time to try and decrease his oxygen level. O2 Sats average during exercise at 97%-100% on 4-5 liters .  We will continue to encouage him to decrease  liters of oxygen while he works toward meeting his goals.  His personal goals for the program are to get stronger, improve SOB with activity, breath better and get lungs stronger for lung reduction surgery.   We will continue to monitor his progress as he works toward meeting these goals. We will encouage him to decrease  liters of oxygen while he works toward meeting his goals. Patient has completed 20 sessions. Maintiaining his weight at 80.6kg.  He continues to use Oxygen at 6 L/M via nasal cannule during class sessions and tolerates well.  He as been instructed several time to try and decrease his oxygen level. O2 Sats average during exercise at 97%-100% on 4-5 liters .  We will continue to encouage him to decrease  liters of oxygen while he works toward meeting his goals.  His personal goals for the program are to get stronger, improve SOB with activity, breathe better and get lungs stronger for lung reduction surgery.   We will continue to monitor his progress as he works toward meeting these goals. We will encouage him to decrease  liters of oxygen while he works toward meeting  his goals.    Expected Outcomes Patient will complete the PR meeting both program and personal goals. Patient will complete the PR meeting both program and personal goals. Patient will complete the PR meeting both program and personal goals. Patient will complete the PR meeting both program and personal goals.             Core Components/Risk Factors/Patient Goals at Discharge (Final Review):   Goals and Risk Factor Review - 10/04/21 1510       Core  Components/Risk Factors/Patient Goals Review   Personal Goals Review Improve shortness of breath with ADL's;Increase knowledge of respiratory medications and ability to use respiratory devices properly.;Develop more efficient breathing techniques such as purse lipped breathing and diaphragmatic breathing and practicing self-pacing with activity.    Review Patient has completed 20 sessions. Maintiaining his weight at 80.6kg.  He continues to use Oxygen at 6 L/M via nasal cannule during class sessions and tolerates well.  He as been instructed several time to try and decrease his oxygen level. O2 Sats average during exercise at 97%-100% on 4-5 liters .  We will continue to encouage him to decrease  liters of oxygen while he works toward meeting his goals.  His personal goals for the program are to get stronger, improve SOB with activity, breathe better and get lungs stronger for lung reduction surgery.   We will continue to monitor his progress as he works toward meeting these goals. We will encouage him to decrease  liters of oxygen while he works toward meeting his goals.    Expected Outcomes Patient will complete the PR meeting both program and personal goals.             ITP Comments:   Comments: ITP REVIEW Pt is making expected progress toward pulmonary rehab goals after completing 22 sessions. Recommend continued exercise, life style modification, education, and utilization of breathing techniques to increase stamina and strength and decrease shortness of breath with exertion.

## 2021-10-14 ENCOUNTER — Encounter (HOSPITAL_COMMUNITY): Payer: No Typology Code available for payment source

## 2021-10-17 ENCOUNTER — Emergency Department (HOSPITAL_COMMUNITY): Payer: No Typology Code available for payment source

## 2021-10-17 ENCOUNTER — Encounter (HOSPITAL_COMMUNITY): Payer: Self-pay | Admitting: Emergency Medicine

## 2021-10-17 ENCOUNTER — Emergency Department (HOSPITAL_COMMUNITY)
Admission: EM | Admit: 2021-10-17 | Discharge: 2021-10-17 | Disposition: A | Discharge status: Home / Self Care | Payer: No Typology Code available for payment source | Attending: Emergency Medicine | Admitting: Emergency Medicine

## 2021-10-17 ENCOUNTER — Other Ambulatory Visit: Payer: Self-pay

## 2021-10-17 DIAGNOSIS — L03115 Cellulitis of right lower limb: Secondary | ICD-10-CM | POA: Insufficient documentation

## 2021-10-17 DIAGNOSIS — L0390 Cellulitis, unspecified: Secondary | ICD-10-CM

## 2021-10-17 DIAGNOSIS — J449 Chronic obstructive pulmonary disease, unspecified: Secondary | ICD-10-CM | POA: Diagnosis not present

## 2021-10-17 DIAGNOSIS — Z79899 Other long term (current) drug therapy: Secondary | ICD-10-CM | POA: Diagnosis not present

## 2021-10-17 LAB — LACTIC ACID, PLASMA: Lactic Acid, Venous: 0.9 mmol/L (ref 0.5–1.9)

## 2021-10-17 LAB — CBC WITH DIFFERENTIAL/PLATELET
Abs Immature Granulocytes: 0.01 10*3/uL (ref 0.00–0.07)
Basophils Absolute: 0 10*3/uL (ref 0.0–0.1)
Basophils Relative: 1 %
Eosinophils Absolute: 0.3 10*3/uL (ref 0.0–0.5)
Eosinophils Relative: 5 %
HCT: 32.9 % — ABNORMAL LOW (ref 39.0–52.0)
Hemoglobin: 10.7 g/dL — ABNORMAL LOW (ref 13.0–17.0)
Immature Granulocytes: 0 %
Lymphocytes Relative: 29 %
Lymphs Abs: 1.6 10*3/uL (ref 0.7–4.0)
MCH: 30.4 pg (ref 26.0–34.0)
MCHC: 32.5 g/dL (ref 30.0–36.0)
MCV: 93.5 fL (ref 80.0–100.0)
Monocytes Absolute: 0.4 10*3/uL (ref 0.1–1.0)
Monocytes Relative: 7 %
Neutro Abs: 3.1 10*3/uL (ref 1.7–7.7)
Neutrophils Relative %: 58 %
Platelets: 227 10*3/uL (ref 150–400)
RBC: 3.52 MIL/uL — ABNORMAL LOW (ref 4.22–5.81)
RDW: 13.5 % (ref 11.5–15.5)
WBC: 5.3 10*3/uL (ref 4.0–10.5)
nRBC: 0 % (ref 0.0–0.2)

## 2021-10-17 LAB — BASIC METABOLIC PANEL
Anion gap: 5 (ref 5–15)
BUN: 17 mg/dL (ref 8–23)
CO2: 31 mmol/L (ref 22–32)
Calcium: 9.2 mg/dL (ref 8.9–10.3)
Chloride: 110 mmol/L (ref 98–111)
Creatinine, Ser: 1.39 mg/dL — ABNORMAL HIGH (ref 0.61–1.24)
GFR, Estimated: 55 mL/min — ABNORMAL LOW (ref >60)
Glucose, Bld: 102 mg/dL — ABNORMAL HIGH (ref 70–99)
Potassium: 4.4 mmol/L (ref 3.5–5.1)
Sodium: 146 mmol/L — ABNORMAL HIGH (ref 135–145)

## 2021-10-17 MED ORDER — LINEZOLID 600 MG/300ML IV SOLN
600.0000 mg | Freq: Two times a day (BID) | INTRAVENOUS | Status: DC
  Filled 2021-10-17: qty 300

## 2021-10-17 MED ORDER — BACITRACIN ZINC 500 UNIT/GM EX OINT
TOPICAL_OINTMENT | Freq: Once | CUTANEOUS | Status: AC
  Administered 2021-10-17: 1 via TOPICAL

## 2021-10-17 MED ORDER — VANCOMYCIN HCL IN DEXTROSE 1-5 GM/200ML-% IV SOLN
1000.0000 mg | Freq: Once | INTRAVENOUS | Status: AC
  Administered 2021-10-17: 1000 mg via INTRAVENOUS
  Filled 2021-10-17: qty 200

## 2021-10-17 MED ORDER — BACITRACIN ZINC 500 UNIT/GM EX OINT
TOPICAL_OINTMENT | CUTANEOUS | Status: AC
  Filled 2021-10-17: qty 0.9

## 2021-10-17 MED ORDER — LACTATED RINGERS IV BOLUS
1000.0000 mL | Freq: Once | INTRAVENOUS | Status: AC
  Administered 2021-10-17: 1000 mL via INTRAVENOUS

## 2021-10-17 MED ORDER — PIPERACILLIN-TAZOBACTAM 3.375 G IVPB 30 MIN
3.3750 g | Freq: Once | INTRAVENOUS | Status: AC
  Administered 2021-10-17: 3.375 g via INTRAVENOUS
  Filled 2021-10-17: qty 50

## 2021-10-18 ENCOUNTER — Telehealth (HOSPITAL_COMMUNITY): Payer: Self-pay | Admitting: Emergency Medicine

## 2021-10-18 MED ORDER — CEPHALEXIN 500 MG PO CAPS: 500.0000 mg | ORAL_CAPSULE | Freq: Four times a day (QID) | ORAL | 0 refills | Status: AC

## 2021-10-18 MED ORDER — DOXYCYCLINE HYCLATE 100 MG PO CAPS: 100.0000 mg | ORAL_CAPSULE | Freq: Two times a day (BID) | ORAL | 0 refills | Status: AC

## 2021-10-19 ENCOUNTER — Encounter (HOSPITAL_COMMUNITY): Payer: No Typology Code available for payment source

## 2021-10-21 ENCOUNTER — Encounter (HOSPITAL_COMMUNITY): Payer: No Typology Code available for payment source

## 2021-10-23 LAB — CULTURE, BLOOD (ROUTINE X 2): Culture: NO GROWTH

## 2021-10-26 ENCOUNTER — Encounter (HOSPITAL_COMMUNITY): Payer: No Typology Code available for payment source

## 2021-10-28 ENCOUNTER — Encounter (HOSPITAL_COMMUNITY)
Admission: RE | Admit: 2021-10-28 | Discharge: 2021-10-28 | Disposition: A | Discharge status: Home / Self Care | Payer: No Typology Code available for payment source | Source: Ambulatory Visit | Attending: Pathology | Admitting: Pathology

## 2021-10-28 VITALS — Wt 176.1 lb

## 2021-10-28 DIAGNOSIS — J449 Chronic obstructive pulmonary disease, unspecified: Secondary | ICD-10-CM | POA: Insufficient documentation

## 2021-10-28 NOTE — Progress Notes (Signed)
Daily Session Note  Patient Details  Name: Gerald Williams MRN: [id number] Date of Birth: [birth year] Referring Provider:   Flowsheet Row PULMONARY REHAB COPD ORIENTATION from 07/05/2021 in [place]  Referring Provider Dr. Spero Curb       Encounter Date: 10/28/2021  Check In:  Session Check In - 10/28/21 1045       Check-In   Supervising physician immediately available to respond to emergencies [facility] MD immediately available    Physician(s) Dr. Johney Frame    Location [facility]    Staff Present Redge Gainer, BS, Exercise Physiologist;Lalitha Ilyas Wynetta Emery, RN, Bjorn Loser, MS, ACSM-CEP, Exercise Physiologist    Virtual Visit No    Medication changes reported     No    Fall or balance concerns reported    Yes    Comments He has fallen once this year due to his right leg "giving out". He also has diabetic neuropathy that alters his balance as well.    Tobacco Cessation No Change    Warm-up and Cool-down Performed as group-led instruction    Resistance Training Performed Yes    VAD Patient? No    PAD/SET Patient? No      Pain Assessment   Currently in Pain? No/denies    Pain Score 0-No pain    Multiple Pain Sites No             Capillary Blood Glucose: No results found for this or any previous visit (from the past 24 hour(s)).    Social History   Tobacco Use  Smoking Status Former   Packs/day: 1.50   Years: 50.00   Total pack years: 75.00   Types: Cigarettes   Start date: 04/26/1963   Quit date: 09/22/2013   Years since quitting: 8.1  Smokeless Tobacco Never  Tobacco Comments   patient using the patch, losenges, and medication    Goals Met:  Proper associated with RPD/PD & O2 Sat Independence with exercise equipment Using PLB without cueing & demonstrates good technique Exercise tolerated well No report of concerns or symptoms today Strength training completed today  Goals Unmet:  Not Applicable  Comments:  Check out 1145.   Dr. Kathie Dike is Medical Director for [facility].

## 2021-10-29 ENCOUNTER — Encounter (HOSPITAL_BASED_OUTPATIENT_CLINIC_OR_DEPARTMENT_OTHER): Payer: Non-veteran care | Admitting: General Surgery

## 2021-10-29 ENCOUNTER — Encounter (HOSPITAL_BASED_OUTPATIENT_CLINIC_OR_DEPARTMENT_OTHER): Payer: Medicare Other | Attending: General Surgery | Admitting: General Surgery

## 2021-10-29 DIAGNOSIS — W109XXA Fall (on) (from) unspecified stairs and steps, initial encounter: Secondary | ICD-10-CM | POA: Diagnosis not present

## 2021-10-29 DIAGNOSIS — J9611 Chronic respiratory failure with hypoxia: Secondary | ICD-10-CM | POA: Insufficient documentation

## 2021-10-29 DIAGNOSIS — I5020 Unspecified systolic (congestive) heart failure: Secondary | ICD-10-CM | POA: Diagnosis not present

## 2021-10-29 DIAGNOSIS — L97812 Non-pressure chronic ulcer of other part of right lower leg with fat layer exposed: Secondary | ICD-10-CM | POA: Insufficient documentation

## 2021-10-29 DIAGNOSIS — I110 Hypertensive heart disease with heart failure: Secondary | ICD-10-CM | POA: Insufficient documentation

## 2021-10-29 DIAGNOSIS — J439 Emphysema, unspecified: Secondary | ICD-10-CM | POA: Insufficient documentation

## 2021-10-29 DIAGNOSIS — Z9981 Dependence on supplemental oxygen: Secondary | ICD-10-CM | POA: Insufficient documentation

## 2021-10-29 DIAGNOSIS — S81811A Laceration without foreign body, right lower leg, initial encounter: Secondary | ICD-10-CM | POA: Insufficient documentation

## 2021-10-29 DIAGNOSIS — G629 Polyneuropathy, unspecified: Secondary | ICD-10-CM | POA: Diagnosis not present

## 2021-10-29 NOTE — Progress Notes (Signed)
Gerald, Williams ([id number]) Visit Report for 10/29/2021 Abuse Risk Screen Details Patient Name: Date of Service: Gerald Williams [facility] 10/29/2021 8:00 A M Medical Record Number: [id number] Patient Account Number: [id number] Date of Birth/Sex: Treating RN: Jan 16, 1951 (71 y.o. Gerald Williams Primary Care Eudell Mcphee: [place], [place] M Other Clinician: Referring Yannis Gumbs: Treating Kip Cropp/Extender: Jacolyn Reedy in Treatment: 0 Abuse Risk Screen Items Answer ABUSE RISK SCREEN: Has anyone close to you tried to hurt or harm you recentlyo No Do you feel uncomfortable with anyone in your familyo No Has anyone forced you do things that you didnt want to doo No Electronic Signature(s) Signed: 10/29/2021 10:19:37 AM By: Karie Schwalbe RN Entered By: Karie Schwalbe on 10/29/2021 07:58:27 -------------------------------------------------------------------------------- Activities of Daily Living Details Patient Name: Date of Service: Gerald Williams [facility] 10/29/2021 8:00 A M Medical Record Number: [id number] Patient Account Number: [id number] Date of Birth/Sex: Treating RN: November 29, 1950 (71 y.o. Gerald Williams Primary Care Gerald Williams: [place], [place] M Other Clinician: Referring Detron Carras: Treating Mahathi Pokorney/Extender: Jacolyn Reedy in Treatment: 0 Activities of Daily Living Items Answer Activities of Daily Living (Please select one for each item) Drive Automobile Completely Able T Medications ake Completely Able Use T elephone Completely Able Care for Appearance Completely Able Use T oilet Completely Able Bath / Shower Completely Able Dress Self Completely Able Feed Self Completely Able Walk Completely Able Get In / Out Bed Completely Able Housework Completely Able Prepare Meals Completely Able Handle Money Completely Able Shop for Self Completely Able Electronic Signature(s) Signed: 10/29/2021 10:19:37 AM By: Karie Schwalbe RN Entered By:  Karie Schwalbe on 10/29/2021 07:58:56 -------------------------------------------------------------------------------- Education Screening Details Patient Name: Date of Service: Gerald Williams [facility] 10/29/2021 8:00 A M Medical Record Number: [id number] Patient Account Number: [id number] Date of Birth/Sex: Treating RN: 1951/04/01 (71 y.o. Gerald Williams Primary Care Gerald Williams: [place], [place] M Other Clinician: Referring Gerald Williams: Treating Gerald Williams/Extender: Jacolyn Reedy in Treatment: 0 Learning Preferences/Education Level/Primary Language Learning Preference: Explanation, Demonstration, Printed Material Highest Education Level: College or Above Preferred Language: English Cognitive Barrier Language Barrier: No Translator Needed: No Memory Deficit: No Emotional Barrier: No Cultural/Religious Beliefs Affecting Medical Care: No Physical Barrier Impaired Vision: No Impaired Hearing: No Decreased Hand dexterity: No Knowledge/Comprehension Knowledge Level: High Comprehension Level: High Ability to understand written instructions: High Ability to understand verbal instructions: High Motivation Anxiety Level: Calm Cooperation: Cooperative Education Importance: Acknowledges Need Interest in Health Problems: Asks Questions Perception: Coherent Willingness to Engage in Self-Management High Activities: Readiness to Engage in Self-Management High Activities: Electronic Signature(s) Signed: 10/29/2021 10:19:37 AM By: Karie Schwalbe RN Entered By: Karie Schwalbe on 10/29/2021 07:59:53 -------------------------------------------------------------------------------- Fall Risk Assessment Details Patient Name: Date of Service: Gerald Williams [facility] 10/29/2021 8:00 A M Medical Record Number: [id number] Patient Account Number: [id number] Date of Birth/Sex: Treating RN: [birth year] (71 y.o. Gerald Williams Primary Care Gerald Williams: [place], [place] M Other  Clinician: Referring Neithan Williams: Treating Gerald Williams/Extender: Jacolyn Reedy in Treatment: 0 Fall Risk Assessment Items Have you had 2 or more falls in the last 12 monthso 0 Yes Have you had any fall that resulted in injury in the last 12 monthso 0 Yes FALLS RISK SCREEN History of falling - immediate or within 3 months 0 No Secondary diagnosis (Do you have 2 or more medical diagnoseso) 0 No Ambulatory aid None/bed rest/wheelchair/nurse 0 No Crutches/cane/walker 0 No Furniture 0 No Intravenous therapy Access/Saline/Heparin Lock 0 No Gait/Transferring Normal/ bed rest/ wheelchair 0  No Weak (short steps with or without shuffle, stooped but able to lift head while walking, may seek 0 No support from furniture) Impaired (short steps with shuffle, may have difficulty arising from chair, head down, impaired 0 No balance) Mental Status Oriented to own ability 0 No Electronic Signature(s) Signed: 10/29/2021 10:19:37 AM By: Karie Schwalbe RN Entered By: Karie Schwalbe on 10/29/2021 08:00:27 -------------------------------------------------------------------------------- Foot Assessment Details Patient Name: Date of Service: Gerald Williams [facility] 10/29/2021 8:00 A M Medical Record Number: [id number] Patient Account Number: [id number] Date of Birth/Sex: Treating RN: November 04, 1950 (71 y.o. Gerald Williams Primary Care Gerald Williams: [place], [place] M Other Clinician: Referring Gerald Williams: Treating Gerald Williams/Extender: Jacolyn Reedy in Treatment: 0 Foot Assessment Items Site Locations + = Sensation present, - = Sensation absent, C = Callus, U = Ulcer R = Redness, W = Warmth, M = Maceration, PU = Pre-ulcerative lesion F = Fissure, S = Swelling, D = Dryness Assessment Right: Left: Other Deformity: No No Prior Foot Ulcer: No No Prior Amputation: No No Charcot Joint: No No Ambulatory Status: Ambulatory Without Help Gait: Steady Electronic  Signature(s) Signed: 10/29/2021 10:19:37 AM By: Karie Schwalbe RN Entered By: Karie Schwalbe on 10/29/2021 08:03:55 -------------------------------------------------------------------------------- Nutrition Risk Screening Details Patient Name: Date of Service: Gerald Williams [facility] 10/29/2021 8:00 A M Medical Record Number: [id number] Patient Account Number: [id number] Date of Birth/Sex: Treating RN: [birth year] (71 y.o. Gerald Williams Primary Care Leevon Upperman: [place], [place] M Other Clinician: Referring Kylie Simmonds: Treating Aryel Edelen/Extender: Jacolyn Reedy in Treatment: 0 Height (in): 66 Weight (lbs): 177 Body Mass Index (BMI): 28.6 Nutrition Risk Screening Items Score Screening NUTRITION RISK SCREEN: I have an illness or condition that made me change the kind and/or amount of food I eat 0 No I eat fewer than two meals per Williams 0 No I eat few fruits and vegetables, or milk products 0 No I have three or more drinks of beer, liquor or wine almost every Williams 0 No I have tooth or mouth problems that make it hard for me to eat 0 No I don't always have enough money to buy the food I need 0 No I eat alone most of the time 0 No I take three or more different prescribed or over-the-counter drugs a Williams 0 No Without wanting to, I have lost or gained 10 pounds in the last six months 0 No I am not always physically able to shop, cook and/or feed myself 0 No Nutrition Protocols Good Risk Protocol 0 No interventions needed Moderate Risk Protocol High Risk Proctocol Risk Level: Good Risk Score: 0 Electronic Signature(s) Signed: 10/29/2021 10:19:37 AM By: Karie Schwalbe RN Entered By: Karie Schwalbe on 10/29/2021 08:00:49

## 2021-10-29 NOTE — Progress Notes (Signed)
JAYDAN, MEIDINGER ([id number]) Visit Report for 10/29/2021 Chief Complaint Document Details Patient Name: Date of Service: Gerald Williams [facility] 10/29/2021 8:00 A M Medical Record Number: [id number] Patient Account Number: [id number] Date of Birth/Sex: Treating RN: 09/17/1950 (71 y.o. [name] Primary Care Provider: [employer], [place] M Other Clinician: Referring Provider: Treating Provider/Extender: Jacolyn Reedy in Treatment: 0 Information Obtained from: Patient Electronic Signature(s) Signed: 10/29/2021 8:33:51 AM By: Duanne Guess MD FACS Previous Signature: 10/29/2021 8:33:20 AM Version By: Duanne Guess MD FACS Entered By: Duanne Guess on 10/29/2021 08:33:51 -------------------------------------------------------------------------------- Debridement Details Patient Name: Date of Service: Gerald Williams [facility] 10/29/2021 8:00 A M Medical Record Number: [id number] Patient Account Number: [id number] Date of Birth/Sex: Treating RN: Oct 22, 1950 (71 y.o. [name] Primary Care Provider: [place], [place] M Other Clinician: Referring Provider: Treating Provider/Extender: Jacolyn Reedy in Treatment: 0 Debridement Performed for Assessment: Wound #1 Right Knee Performed By: Physician Duanne Guess, MD Debridement Type: Debridement Level of Consciousness (Pre-procedure): Awake and Alert Pre-procedure Verification/Time Out Yes - 08:28 Taken: Start Time: 08:28 Pain Control: Lidocaine 4% Topical Solution T Area Debrided (L x W): otal 6 (cm) x 1 (cm) = 6 (cm) Tissue and other material debrided: Non-Viable, Eschar, Subcutaneous Level: Skin/Subcutaneous Tissue Debridement Description: Excisional Instrument: Curette Bleeding: Minimum Hemostasis Achieved: Pressure End Time: 08:29 Procedural Pain: 0 Post Procedural Pain: 0 Response to Treatment: Procedure was tolerated well Level of Consciousness (Post- Awake and  Alert procedure): Post Debridement Measurements of Total Wound Length: (cm) 6 Width: (cm) 1 Depth: (cm) 0.2 Volume: (cm) 0.942 Character of Wound/Ulcer Post Debridement: Improved Post Procedure Diagnosis Same as Pre-procedure Electronic Signature(s) Signed: 10/29/2021 9:24:47 AM By: Duanne Guess MD FACS Signed: 10/29/2021 10:19:37 AM By: Karie Schwalbe RN Entered By: Karie Schwalbe on 10/29/2021 08:32:42 -------------------------------------------------------------------------------- HPI Details Patient Name: Date of Service: Gerald Williams [facility] 10/29/2021 8:00 A M Medical Record Number: [id number] Patient Account Number: [id number] Date of Birth/Sex: Treating RN: [birth year] (71 y.o. 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 He initially sought care in the emergency department. No suture repair was deemed necessary. He returned about a week later with increased redness and swelling and pain. He was prescribed both doxycycline and Keflex and instructed to apply Neosporin to the wounds. He is here today for further evaluation. ABI in clinic was 1.05. He has 2 adjacent open areas on his right anterior tibial surface. They have a layer of eschar overlying them. It is soft and underneath this there is some areas of fat necrosis and slough. Periwound skin is in good condition with minimal swelling. No erythema or warmth. No odor or purulent drainage. Electronic Signature(s) Signed: 10/29/2021  8:34:05 AM By: Duanne Guess MD FACS Entered By: Duanne Guess on 10/29/2021 08:34:05 -------------------------------------------------------------------------------- Physical Exam Details Patient Name: Date of Service: Gerald Williams [facility] 10/29/2021 8:00 A M Medical Record Number: [id number] Patient Account Number: [id number] Date of Birth/Sex: Treating RN: 04-Apr-1951 (71 y.o. [name] Primary Care Provider: [place], [place] M Other Clinician: Referring Provider: Treating Provider/Extender: Jamal Maes,  Ajith Weeks in Treatment: 0 Constitutional He is hypertensive but asymptomatic.. . . . No acute distress. Respiratory Normal work of breathing on supplemental oxygen. Notes 10/29/2021: He has 2 adjacent open areas on his right anterior tibial surface. They have a layer of eschar overlying them. It is soft and underneath this there is some areas of fat necrosis and slough. Periwound skin is in good condition with minimal swelling. No erythema or warmth. No odor or purulent drainage. Electronic Signature(s) Signed: 10/29/2021 8:35:49 AM By: Duanne Guess MD FACS Entered By: Duanne Guess on 10/29/2021 08:35:49 -------------------------------------------------------------------------------- Physician Orders Details Patient Name: Date of Service: Gerald Williams [facility] 10/29/2021 8:00 A M Medical Record Number: [id number] Patient Account Number: [id number] Date of Birth/Sex: Treating RN: 1951/04/16 (71 y.o. [name] Primary Care Provider: [place], [place] M Other Clinician: Referring Provider: Treating Provider/Extender: Jacolyn Reedy in Treatment: 0 Verbal / Phone Orders: No Diagnosis Coding ICD-10 Coding Code Description [phone] Non-pressure chronic ulcer of other part of right lower leg with fat layer exposed S81.811A Laceration without foreign body, right lower leg, initial encounter J44.9 Chronic obstructive  pulmonary disease, unspecified J96.11 Chronic respiratory failure with hypoxia I20.8 Other forms of angina pectoris I10 Essential (primary) hypertension G62.9 Polyneuropathy, unspecified I50.20 Unspecified systolic (congestive) heart failure Follow-up Appointments ppointment in 1 week. - Dr Lady Gary Room 3 Monday July 17th at 8 am Return A Arboriculturist Other Bathing/Shower/Hygiene Orders/Instructions: - Please change wound dressing every day. Thanks ! Off-Loading Other: - Please elevate legs throughout the day. Wound Treatment Wound #1 - Knee Wound Laterality: Right Cleanser: Soap and Water 1 x Per Day/30 Days Discharge Instructions: May shower and wash wound with dial antibacterial soap and water prior to dressing change. Cleanser: Wound Cleanser (DME) (Generic) 1 x Per Day/30 Days Discharge Instructions: Cleanse the wound with wound cleanser prior to applying a clean dressing using gauze sponges, not tissue or cotton balls. Prim Dressing: KerraCel Ag Gelling Fiber Dressing, 2x2 in (silver alginate) (DME) (Generic) 1 x Per Day/30 Days ary Discharge Instructions: Apply silver alginate to wound bed as instructed Secondary Dressing: Woven Gauze Sponge, Non-Sterile 4x4 in (DME) (Generic) 1 x Per Day/30 Days Discharge Instructions: Apply over primary dressing as directed. Secondary Dressing: Zetuvit Plus Silicone Border Dressing 5x5 (in/in) (DME) (Generic) 1 x Per Day/30 Days Discharge Instructions: Apply silicone border over primary dressing as directed. Electronic Signature(s) Signed: 10/29/2021 9:24:47 AM By: Duanne Guess MD FACS Signed: 10/29/2021 10:19:37 AM By: Karie Schwalbe RN Previous Signature: 10/29/2021 8:35:56 AM Version By: Duanne Guess MD FACS Entered By: Karie Schwalbe on 10/29/2021 08:43:45 -------------------------------------------------------------------------------- Problem List Details Patient Name: Date of Service: Gerald Williams [facility] 10/29/2021 8:00 A  M Medical Record Number: [id number] Patient Account Number: [id number] Date of Birth/Sex: Treating RN: 02-Apr-1951 (71 y.o. [name] Primary Care Provider: [place], [place] M Other Clinician: Referring Provider: Treating Provider/Extender: Jacolyn Reedy in Treatment: 0 Active Problems ICD-10 Encounter Code Description Active Date MDM Diagnosis [phone] Non-pressure chronic ulcer of other part of right lower leg with fat layer 10/29/2021 No Yes exposed S81.811A Laceration without foreign body, right lower leg, initial encounter 10/29/2021 No Yes J44.9 Chronic obstructive pulmonary disease, unspecified 10/29/2021 No Yes J96.11 Chronic respiratory failure with hypoxia 10/29/2021 No Yes I20.8 Other forms of angina pectoris 10/29/2021 No Yes I10 Essential (primary) hypertension 10/29/2021 No Yes G62.9 Polyneuropathy, unspecified 10/29/2021 No Yes I50.20 Unspecified systolic (congestive) heart failure 10/29/2021 No Yes Inactive Problems Resolved Problems  Electronic Signature(s) Signed: 10/29/2021 8:25:17 AM By: Duanne Guess MD FACS Entered By: Duanne Guess on 10/29/2021 08:25:17 -------------------------------------------------------------------------------- Progress Note Details Patient Name: Date of Service: Gerald Williams [facility] 10/29/2021 8:00 A M Medical Record Number: [id number] Patient Account Number: [id number] Date of Birth/Sex: Treating RN: 1950/11/26 (71 y.o. 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 He initially sought care in the emergency department. No suture repair was deemed necessary. He returned about a week later with increased redness and swelling and pain. He was prescribed both doxycycline and Keflex and instructed to apply Neosporin to the wounds. He is here today for further evaluation. ABI in clinic was 1.05. He has 2 adjacent open areas on his right anterior tibial surface. They have a layer of eschar overlying them. It is soft and underneath this there is some areas of fat necrosis and slough. Periwound skin is in good condition with minimal swelling. No erythema or warmth. No odor or purulent drainage. Patient History Information obtained from Patient. Allergies No Known Allergies Family History Unknown History. Social History Former smoker, Marital Status - Divorced, Alcohol Use - Rarely - Not now, Drug Use - Prior History - Not now, Caffeine Use - Daily. Medical History Respiratory Patient has history of Chronic Obstructive Pulmonary Disease (COPD) - Oxygen dependent, Sleep Apnea - Obstructive Sleep apnea Cardiovascular Patient has history of Congestive Heart Failure, Coronary Artery Disease, Hypertension Neurologic Patient has history of Neuropathy - Peripheral neuropathy Medical A Surgical History Notes nd Psychiatric Has PTSD Review of Systems (ROS) Constitutional Symptoms (General Health) Denies complaints or symptoms of Fatigue, Fever, Chills, Marked Weight Change. Eyes Denies complaints or symptoms of Dry Eyes, Vision Changes, Glasses / Contacts. Integumentary (Skin) Complains or has symptoms of Wounds - Right Knee. Musculoskeletal Denies complaints or symptoms of Muscle Pain, Muscle Weakness. Objective Constitutional He is hypertensive but asymptomatic.Marland Kitchen No acute distress. Vitals Time Taken: 7:51 AM, Height: 66 in, Source: Stated, Weight: 177 lbs,  Source: Stated, BMI: 28.6, Temperature: 98.1 F, Pulse: 76 bpm, Respiratory Rate: 18 breaths/min, Blood Pressure: 152/85 mmHg. Respiratory Normal work of breathing on supplemental oxygen. General Notes: 10/29/2021: He has 2 adjacent open areas on his right anterior tibial surface. They have a layer of eschar overlying them. It is soft and underneath this there is some areas of fat necrosis and slough. Periwound skin is in good condition with minimal swelling. No erythema or warmth. No odor or purulent drainage. Integumentary (Hair, Skin) Wound #1 status is Open. Original cause of wound was Trauma. The date acquired was: 10/16/2021. The wound is located on the Right Knee. The wound measures 6cm length x 1cm width x 0.2cm depth; 4.712cm^2 area and 0.942cm^3 volume. There is Fat Layer (Subcutaneous Tissue) exposed. There is no tunneling or undermining noted. There is a medium amount of serosanguineous drainage noted. The wound margin is distinct with the outline attached to the wound base. There is small (1-33%) pink granulation within the wound bed. There is a large (67-100%) amount of necrotic tissue within the wound bed including Eschar and  Adherent Slough. Assessment Active Problems ICD-10 Non-pressure chronic ulcer of other part of right lower leg with fat layer exposed Laceration without foreign body, right lower leg, initial encounter Chronic obstructive pulmonary disease, unspecified Chronic respiratory failure with hypoxia Other forms of angina pectoris Essential (primary) hypertension Polyneuropathy, unspecified Unspecified systolic (congestive) heart failure Procedures Wound #1 Pre-procedure diagnosis of Wound #1 is an Abrasion located on the Right Knee . There was a Excisional Skin/Subcutaneous Tissue Debridement with a total area of 6 sq cm performed by Duanne Guess, MD. With the following instrument(s): Curette to remove Non-Viable tissue/material. Material removed includes  Eschar and Subcutaneous Tissue and after achieving pain control using Lidocaine 4% T opical Solution. No specimens were taken. A time out was conducted at 08:28, prior to the start of the procedure. A Minimum amount of bleeding was controlled with Pressure. The procedure was tolerated well with a pain level of 0 throughout and a pain level of 0 following the procedure. Post Debridement Measurements: 6cm length x 1cm width x 0.2cm depth; 0.942cm^3 volume. Character of Wound/Ulcer Post Debridement is improved. Post procedure Diagnosis Wound #1: Same as Pre-Procedure Plan 10/29/2021: This is a 72 year old man who fell and suffered a laceration to his right anterior tibial surface. He has 2 adjacent open areas on his right anterior tibial surface. They have a layer of eschar overlying them. It is soft and underneath this there is some areas of fat necrosis and slough. Periwound skin is in good condition with minimal swelling. No erythema or warmth. No odor or purulent drainage. I used a curette to debride slough and dead fat, along with the eschar, from his wound. We will apply silver alginate and a foam border dressing. He will complete the course of antibiotics he was prescribed by the emergency room. Follow-up in 1 week. Electronic Signature(s) Signed: 10/29/2021 8:37:55 AM By: Duanne Guess MD FACS Entered By: Duanne Guess on 10/29/2021 08:37:54 -------------------------------------------------------------------------------- HxROS Details Patient Name: Date of Service: Gerald Williams [facility] 10/29/2021 8:00 A M Medical Record Number: [id number] Patient Account Number: [id number] Date of Birth/Sex: Treating RN: [birth year] (71 y.o. [name] Primary Care Provider: [place], [place] M Other Clinician: Referring Provider: Treating Provider/Extender: Jacolyn Reedy in Treatment: 0 Information Obtained From Patient Constitutional Symptoms (General  Health) Complaints and Symptoms: Negative for: Fatigue; Fever; Chills; Marked Weight Change Eyes Complaints and Symptoms: Negative for: Dry Eyes; Vision Changes; Glasses / Contacts Integumentary (Skin) Complaints and Symptoms: Positive for: Wounds - Right Knee Musculoskeletal Complaints and Symptoms: Negative for: Muscle Pain; Muscle Weakness Respiratory Medical History: Positive for: Chronic Obstructive Pulmonary Disease (COPD) - Oxygen dependent; Sleep Apnea - Obstructive Sleep apnea Cardiovascular Medical History: Positive for: Congestive Heart Failure; Coronary Artery Disease; Hypertension Neurologic Medical History: Positive for: Neuropathy - Peripheral neuropathy Oncologic Psychiatric Medical History: Past Medical History Notes: Has PTSD Immunizations Pneumococcal Vaccine: Received Pneumococcal Vaccination: Yes Received Pneumococcal Vaccination On or After 60th Birthday: Yes Implantable Devices None Family and Social History Unknown History: Yes; Former smoker; Marital Status - Divorced; Alcohol Use: Rarely - Not now; Drug Use: Prior History - Not now; Caffeine Use: Daily; Financial Concerns: No; Food, Clothing or Shelter Needs: No; Support System Lacking: No; Transportation Concerns: No Electronic Signature(s) Signed: 10/29/2021 9:24:47 AM By: Duanne Guess MD FACS Signed: 10/29/2021 10:19:37 AM By: Karie Schwalbe RN Entered By: Karie Schwalbe on 10/29/2021 08:01:43 -------------------------------------------------------------------------------- SuperBill Details Patient Name: Date of Service: Gerald Williams [facility] 10/29/2021 Medical Record Number: [id number] Patient Account  Number: [id number] Date of Birth/Sex: Treating RN: 12-Apr-1951 (71 y.o. [name] Primary Care Provider: [place], [place] M Other Clinician: Referring Provider: Treating Provider/Extender: Jacolyn Reedy in Treatment: 0 Diagnosis Coding ICD-10 Codes Code  Description [phone] Non-pressure chronic ulcer of other part of right lower leg with fat layer exposed S81.811A Laceration without foreign body, right lower leg, initial encounter J44.9 Chronic obstructive pulmonary disease, unspecified J96.11 Chronic respiratory failure with hypoxia I20.8 Other forms of angina pectoris I10 Essential (primary) hypertension G62.9 Polyneuropathy, unspecified I50.20 Unspecified systolic (congestive) heart failure Facility Procedures CPT4 Code: [id number] Description: 99213 - WOUND CARE VISIT-LEV 3 EST PT Modifier: 25 Quantity: 1 CPT4 Code: [id number] Description: 11042 - DEB SUBQ TISSUE 20 SQ CM/< ICD-10 Diagnosis Description L97.812 Non-pressure chronic ulcer of other part of right lower leg with fat layer expo Modifier: sed Quantity: 1 Physician Procedures : CPT4 Code Description Modifier [id number] 99204 - WC PHYS LEVEL 4 - NEW PT 25 ICD-10 Diagnosis Description L97.812 Non-pressure chronic ulcer of other part of right lower leg with fat layer exposed S81.811A Laceration without foreign body, right lower  leg, initial encounter J44.9 Chronic obstructive pulmonary disease, unspecified G62.9 Polyneuropathy, unspecified Quantity: 1 : [id number] 11042 - WC PHYS SUBQ TISS 20 SQ CM ICD-10 Diagnosis Description L97.812 Non-pressure chronic ulcer of other part of right lower leg with fat layer exposed Quantity: 1 Electronic Signature(s) Signed: 10/29/2021 9:24:47 AM By: Duanne Guess MD FACS Signed: 10/29/2021 10:19:37 AM By: Karie Schwalbe RN Previous Signature: 10/29/2021 8:38:17 AM Version By: Duanne Guess MD FACS Entered By: Karie Schwalbe on 10/29/2021 08:55:20

## 2021-10-29 NOTE — Progress Notes (Signed)
KHALANI, GAGNIER ([id number]) Visit Report for 10/29/2021 Allergy List Details Patient Name: Date of Service: Gerald Williams [facility] 10/29/2021 8:00 A M Medical Record Number: [id number] Patient Account Number: [id number] Date of Birth/Sex: Treating RN: 1950-07-21 (71 y.o. Gerald Williams Primary Care Stanford Strauch: [place], [place] M Other Clinician: Referring Milad Bublitz: Treating Dwan Hemmelgarn/Extender: Sarina Ill, Ajith Weeks in Treatment: 0 Allergies Active Allergies No Known Allergies Allergy Notes Electronic Signature(s) Signed: 10/29/2021 10:19:37 AM By: Dellie Catholic RN Entered By: Dellie Catholic on 10/29/2021 07:57:57 -------------------------------------------------------------------------------- Arrival Information Details Patient Name: Date of Service: Gerald Williams [facility] 10/29/2021 8:00 A M Medical Record Number: [id number] Patient Account Number: [id number] Date of Birth/Sex: Treating RN: 1951-03-10 (71 y.o. Gerald Williams Primary Care Zelia Yzaguirre: [place], [place] M Other Clinician: Referring Alexcia Schools: Treating Emogene Muratalla/Extender: Rhodia Albright in Treatment: 0 Visit Information Patient Arrived: Ambulatory Arrival Time: 07:50 Accompanied By: self Transfer Assistance: None Patient Identification Verified: Yes Electronic Signature(s) Signed: 10/29/2021 10:19:37 AM By: Dellie Catholic RN Entered By: Dellie Catholic on 10/29/2021 08:28:32 -------------------------------------------------------------------------------- [facility] of Care Assessment Details Patient Name: Date of Service: Gerald Williams [facility] 10/29/2021 8:00 A M Medical Record Number: [id number] Patient Account Number: [id number] Date of Birth/Sex: Treating RN: 1951/04/16 (71 y.o. Gerald Williams Primary Care Cellie Dardis: [place], [place] M Other Clinician: Referring Garrell Flagg: Treating Witney Huie/Extender: Rhodia Albright in Treatment: 0 [facility] of Care  Assessment Items TOOL 1 Quantity Score X- 1 0 Use when EandM and Procedure is performed on INITIAL visit ASSESSMENTS - Nursing Assessment / Reassessment X- 1 20 General Physical Exam (combine w/ comprehensive assessment (listed just below) when performed on new pt. evals) X- 1 25 Comprehensive Assessment (HX, ROS, Risk Assessments, Wounds Hx, etc.) ASSESSMENTS - Wound and Skin Assessment / Reassessment []  - 0 Dermatologic / Skin Assessment (not related to wound area) ASSESSMENTS - Ostomy and/or Continence Assessment and Care []  - 0 Incontinence Assessment and Management []  - 0 Ostomy Care Assessment and Management (repouching, etc.) PROCESS - Coordination of Care X - Simple Patient / Family Education for ongoing care 1 15 []  - 0 Complex (extensive) Patient / Family Education for ongoing care X- 1 10 Staff obtains Programmer, systems, Records, T Results / Process Orders est X- 1 10 Staff telephones HHA, Nursing Homes / Clarify orders / etc []  - 0 Routine Transfer to another Facility (non-emergent condition) []  - 0 Routine Hospital Admission (non-emergent condition) X- 1 15 New Admissions / Biomedical engineer / Ordering NPWT Apligraf, etc. , []  - 0 Emergency Hospital Admission (emergent condition) PROCESS - Special Needs []  - 0 Pediatric / Minor Patient Management []  - 0 Isolation Patient Management []  - 0 Hearing / Language / Visual special needs []  - 0 Assessment of Community assistance (transportation, D/C planning, etc.) []  - 0 Additional assistance / Altered mentation []  - 0 Support Surface(s) Assessment (bed, cushion, seat, etc.) INTERVENTIONS - Miscellaneous []  - 0 External ear exam []  - 0 Patient Transfer (multiple staff / Civil Service fast streamer / Similar devices) []  - 0 Simple Staple / Suture removal (25 or less) []  - 0 Complex Staple / Suture removal (26 or more) []  - 0 Hypo/Hyperglycemic Management (do not check if billed separately) X- 1 15 Ankle / Brachial Index  (ABI) - do not check if billed separately Has the patient been seen at the hospital within the last three years: Yes Total Score: 110 Williams Of Care: New/Established - Williams 3 Electronic Signature(s) Signed: 10/29/2021 10:19:37 AM By:  Dellie Catholic RN Entered By: Dellie Catholic on 10/29/2021 08:55:08 -------------------------------------------------------------------------------- Encounter Discharge Information Details Patient Name: Date of Service: Gerald Williams [facility] 10/29/2021 8:00 A M Medical Record Number: [id number] Patient Account Number: [id number] Date of Birth/Sex: Treating RN: [birth year] (71 y.o. Gerald Williams Primary Care Janet Humphreys: [place], [place] M Other Clinician: Referring Davanta Meuser: Treating Jabron Weese/Extender: Rhodia Albright in Treatment: 0 Encounter Discharge Information Items Post Procedure Vitals Discharge Condition: Stable Temperature (F): 98.1 Ambulatory Status: Ambulatory Pulse (bpm): 76 Discharge Destination: Home Respiratory Rate (breaths/min): 18 Transportation: Private Auto Blood Pressure (mmHg): 152/85 Accompanied By: self Schedule Follow-up Appointment: Yes Clinical Summary of Care: Patient Declined Electronic Signature(s) Signed: 10/29/2021 10:19:37 AM By: Dellie Catholic RN Entered By: Dellie Catholic on 10/29/2021 09:18:53 -------------------------------------------------------------------------------- Lower Extremity Assessment Details Patient Name: Date of Service: Gerald Williams [facility] 10/29/2021 8:00 A M Medical Record Number: [id number] Patient Account Number: [id number] Date of Birth/Sex: Treating RN: 03-Feb-1951 (71 y.o. Gerald Williams Primary Care Kassey Laforest: [place], [place] M Other Clinician: Referring Kelden Lavallee: Treating Lambert Jeanty/Extender: Janeece Riggers Weeks in Treatment: 0 Edema Assessment Assessed: [Left: No] [Right: No] Edema: [Left: Ye] [Right: s] Calf Left: Right: Point of  Measurement: 32 cm From Medial Instep 35.5 cm 37 cm Ankle Left: Right: Point of Measurement: 9 cm From Medial Instep 21.8 cm 22.5 cm Knee To Floor Left: Right: From Medial Instep 42 cm 42 cm Vascular Assessment Pulses: Posterior Tibial Palpable: [Right:Yes] Doppler Audible: [Right:Yes] Blood Pressure: Brachial: [Right:152] Ankle: [Right:Dorsalis Pedis: 160 1.05] Electronic Signature(s) Signed: 10/29/2021 10:19:37 AM By: Dellie Catholic RN Entered By: Dellie Catholic on 10/29/2021 08:20:46 -------------------------------------------------------------------------------- Multi Wound Chart Details Patient Name: Date of Service: Gerald Williams [facility] 10/29/2021 8:00 A M Medical Record Number: [id number] Patient Account Number: [id number] Date of Birth/Sex: Treating RN: 11/16/1950 (71 y.o. Gerald Williams Primary Care Winni Ehrhard: [place], [place] M Other Clinician: Referring Vester Balthazor: Treating Lexus Shampine/Extender: Rhodia Albright in Treatment: 0 Vital Signs Height(in): 66 Pulse(bpm): 76 Weight(lbs): 177 Blood Pressure(mmHg): 152/85 Body Mass Index(BMI): 28.6 Temperature(F): 98.1 Respiratory Rate(breaths/min): 18 Photos: [N/A:N/A] Right Knee N/A N/A Wound Location: Trauma N/A N/A Wounding Event: Abrasion N/A N/A Primary Etiology: Chronic Obstructive Pulmonary N/A N/A Comorbid History: Disease (COPD), Sleep Apnea, Congestive Heart Failure, Coronary Artery Disease, Hypertension, Neuropathy 10/16/2021 N/A N/A Date Acquired: 0 N/A N/A Weeks of Treatment: Open N/A N/A Wound Status: No N/A N/A Wound Recurrence: Yes N/A N/A Clustered Wound: 6x1x0.2 N/A N/A Measurements L x W x D (cm) 4.712 N/A N/A A (cm) : rea 0.942 N/A N/A Volume (cm) : 0.00% N/A N/A % Reduction in Area: 0.00% N/A N/A % Reduction in Volume: Full Thickness Without Exposed N/A N/A Classification: Support Structures Medium N/A N/A Exudate A mount: Serosanguineous N/A  N/A Exudate Type: red, brown N/A N/A Exudate Color: Distinct, outline attached N/A N/A Wound Margin: Small (1-33%) N/A N/A Granulation Amount: Pink N/A N/A Granulation Quality: Large (67-100%) N/A N/A Necrotic Amount: Eschar, Adherent Slough N/A N/A Necrotic Tissue: Fat Layer (Subcutaneous Tissue): Yes N/A N/A Exposed Structures: Fascia: No Tendon: No Muscle: No Joint: No Bone: No Small (1-33%) N/A N/A Epithelialization: Treatment Notes Electronic Signature(s) Signed: 10/29/2021 8:25:31 AM By: Fredirick Maudlin MD FACS Signed: 10/29/2021 10:19:37 AM By: Dellie Catholic RN Entered By: Fredirick Maudlin on 10/29/2021 08:25:31 -------------------------------------------------------------------------------- Multi-Disciplinary Care Plan Details Patient Name: Date of Service: Gerald Williams [facility] 10/29/2021 8:00 A M Medical Record Number: [id number] Patient Account Number: [id number] Date of Birth/Sex: Treating RN:  June 04, 1950 (71 y.o. [name] Primary Care Caris Cerveny: [place], [place] M Other Clinician: Referring Elza Varricchio: Treating Tonishia Steffy/Extender: Jacolyn Reedy in Treatment: 0 Active Inactive Wound/Skin Impairment Nursing Diagnoses: Knowledge deficit related to ulceration/compromised skin integrity Goals: Patient/caregiver will verbalize understanding of skin care regimen Date Initiated: 10/29/2021 Target Resolution Date: 12/23/2021 Goal Status: Active Interventions: Provide education on ulcer and skin care Treatment Activities: Skin care regimen initiated : 10/29/2021 Notes: Electronic Signature(s) Signed: 10/29/2021 10:19:37 AM By: Karie Schwalbe RN Entered By: Karie Schwalbe on 10/29/2021 08:54:03 -------------------------------------------------------------------------------- Pain Assessment Details Patient Name: Date of Service: Junie Bame [facility] 10/29/2021 8:00 A M Medical Record Number: [id number] Patient Account Number:  [id number] Date of Birth/Sex: Treating RN: 10/01/1950 (71 y.o. [name] Primary Care Edith Lord: [place], [place] M Other Clinician: Referring Duel Conrad: Treating Laylani Pudwill/Extender: Jacolyn Reedy in Treatment: 0 Active Problems Location of Pain Severity and Description of Pain Patient Has Paino No Site Locations Pain Management and Medication Current Pain Management: Electronic Signature(s) Signed: 10/29/2021 10:19:37 AM By: Karie Schwalbe RN Entered By: Karie Schwalbe on 10/29/2021 08:20:59 -------------------------------------------------------------------------------- Patient/Caregiver Education Details Patient Name: Date of Service: Junie Bame [facility] 7/7/2023andnbsp8:00 A M Medical Record Number: [id number] Patient Account Number: [id number] Date of Birth/Gender: Treating RN: August 16, 1950 (71 y.o. [name] Primary Care Physician: [place], [place] M Other Clinician: Referring Physician: Treating Physician/Extender: Jacolyn Reedy in Treatment: 0 Education Assessment Education Provided To: Patient Education Topics Provided Wound/Skin Impairment: Methods: Explain/Verbal Responses: Return demonstration correctly Electronic Signature(s) Signed: 10/29/2021 10:19:37 AM By: Karie Schwalbe RN Entered By: Karie Schwalbe on 10/29/2021 08:54:12 -------------------------------------------------------------------------------- Wound Assessment Details Patient Name: Date of Service: Junie Bame [facility] 10/29/2021 8:00 A M Medical Record Number: [id number] Patient Account Number: [id number] Date of Birth/Sex: Treating RN: Apr 23, 1951 (71 y.o. [name] Primary Care Tameah Mihalko: [place], [place] M Other Clinician: Referring Rihana Kiddy: Treating Nedra Mcinnis/Extender: Westley Chandler Weeks in Treatment: 0 Wound Status Wound Number: 1 Primary Abrasion Etiology: Wound Location: Right Knee Wound  Open Wounding Event: Trauma Status: Date Acquired: 10/16/2021 Notes: Cluster of 2 wounds on right knee Weeks Of Treatment: 0 Comorbid Chronic Obstructive Pulmonary Disease (COPD), Sleep Apnea, Clustered Wound: Yes History: Congestive Heart Failure, Coronary Artery Disease, Hypertension, Neuropathy Photos Wound Measurements Length: (cm) 6 Width: (cm) 1 Depth: (cm) 0.2 Area: (cm) 4.712 Volume: (cm) 0.942 % Reduction in Area: 0% % Reduction in Volume: 0% Epithelialization: Small (1-33%) Tunneling: No Undermining: No Wound Description Classification: Full Thickness Without Exposed Support Str Wound Margin: Distinct, outline attached Exudate Amount: Medium Exudate Type: Serosanguineous Exudate Color: red, brown uctures Wound Bed Granulation Amount: Small (1-33%) Exposed Structure Granulation Quality: Pink Fascia Exposed: No Necrotic Amount: Large (67-100%) Fat Layer (Subcutaneous Tissue) Exposed: Yes Necrotic Quality: Eschar, Adherent Slough Tendon Exposed: No Muscle Exposed: No Joint Exposed: No Bone Exposed: No Treatment Notes Wound #1 (Knee) Wound Laterality: Right Cleanser Soap and Water Discharge Instruction: May shower and wash wound with dial antibacterial soap and water prior to dressing change. Wound Cleanser Discharge Instruction: Cleanse the wound with wound cleanser prior to applying a clean dressing using gauze sponges, not tissue or cotton balls. Peri-Wound Care Topical Primary Dressing KerraCel Ag Gelling Fiber Dressing, 2x2 in (silver alginate) Discharge Instruction: Apply silver alginate to wound bed as instructed Secondary Dressing Woven Gauze Sponge, Non-Sterile 4x4 in Discharge Instruction: Apply over primary dressing as directed. Zetuvit Plus Silicone Border Dressing 5x5 (in/in) Discharge Instruction: Apply silicone border over primary dressing  as directed. Secured With Compression Wrap Compression Stockings Geologist, engineering) Signed: 10/29/2021 10:19:37 AM By: Karie Schwalbe RN Entered By: Karie Schwalbe on 10/29/2021 08:23:21 -------------------------------------------------------------------------------- Vitals Details Patient Name: Date of Service: Junie Bame [facility] 10/29/2021 8:00 A M Medical Record Number: [id number] Patient Account Number: [id number] Date of Birth/Sex: Treating RN: 1951-02-25 (70 y.o. [name] Primary Care Emalee Knies: [place], [place] M Other Clinician: Referring Lillyrose Reitan: Treating Topanga Alvelo/Extender: Jacolyn Reedy in Treatment: 0 Vital Signs Time Taken: 07:51 Temperature (F): 98.1 Height (in): 66 Pulse (bpm): 76 Source: Stated Respiratory Rate (breaths/min): 18 Weight (lbs): 177 Blood Pressure (mmHg): 152/85 Source: Stated Reference Range: 80 - 120 mg / dl Body Mass Index (BMI): 28.6 Electronic Signature(s) Signed: 10/29/2021 10:19:37 AM By: Karie Schwalbe RN Entered By: Karie Schwalbe on 10/29/2021 08:29:00

## 2021-11-02 ENCOUNTER — Encounter (HOSPITAL_COMMUNITY): Payer: No Typology Code available for payment source

## 2021-11-04 ENCOUNTER — Encounter (HOSPITAL_COMMUNITY): Payer: No Typology Code available for payment source

## 2021-11-08 ENCOUNTER — Telehealth (HOSPITAL_COMMUNITY): Payer: Self-pay | Admitting: General Practice

## 2021-11-08 ENCOUNTER — Encounter (HOSPITAL_BASED_OUTPATIENT_CLINIC_OR_DEPARTMENT_OTHER): Payer: Medicare Other | Admitting: General Surgery

## 2021-11-08 DIAGNOSIS — S81811A Laceration without foreign body, right lower leg, initial encounter: Secondary | ICD-10-CM | POA: Diagnosis not present

## 2021-11-08 NOTE — Progress Notes (Signed)
RAHMEL, NEDVED ([id number]) Visit Report for 11/08/2021 Arrival Information Details Patient Name: Date of Service: Gerald Williams [facility] 11/08/2021 8:00 A M Medical Record Number: [id number] Patient Account Number: [id number] Date of Birth/Sex: Treating RN: 04-15-1951 (71 y.o. Gerald Williams Primary Care Antonae Zbikowski: [place], [place] M Other Clinician: Referring Deshaun Weisinger: Treating Christmas Faraci/Extender: [facility], [place] M Weeks in Treatment: 1 Visit Information History Since Last Visit Added or deleted any medications: No Patient Arrived: Ambulatory Any new allergies or adverse reactions: No Arrival Time: 07:55 Had a fall or experienced change in No Accompanied By: self activities of daily living that may affect Transfer Assistance: None risk of falls: Signs or symptoms of abuse/neglect since last visito No Hospitalized since last visit: No Implantable device outside of the clinic excluding No cellular tissue based products placed in the center since last visit: Has Dressing in Place as Prescribed: Yes Pain Present Now: No Electronic Signature(s) Signed: 11/08/2021 5:26:27 PM By: Redmond Pulling RN, BSN Entered By: Redmond Pulling on 11/08/2021 07:55:33 -------------------------------------------------------------------------------- Encounter Discharge Information Details Patient Name: Date of Service: Gerald Williams [facility] 11/08/2021 8:00 A M Medical Record Number: [id number] Patient Account Number: [id number] Date of Birth/Sex: Treating RN: 09-02-1950 (71 y.o. Gerald Williams Primary Care Janai Brannigan: [place], [place] M Other Clinician: Referring Foye Damron: Treating Mikiyah Glasner/Extender: [facility], [place] M Weeks in Treatment: 1 Encounter Discharge Information Items Post Procedure Vitals Discharge Condition: Stable Temperature (F): 98 Ambulatory Status: Ambulatory Pulse (bpm): 74 Discharge Destination: Home Respiratory Rate (breaths/min): 18 Transportation:  Other Blood Pressure (mmHg): 141/90 Accompanied By: self Schedule Follow-up Appointment: Yes Clinical Summary of Care: Patient Declined Electronic Signature(s) Signed: 11/08/2021 10:09:05 AM By: Karie Schwalbe RN Entered By: Karie Schwalbe on 11/08/2021 10:08:31 -------------------------------------------------------------------------------- Lower Extremity Assessment Details Patient Name: Date of Service: Gerald Williams [facility] 11/08/2021 8:00 A M Medical Record Number: [id number] Patient Account Number: [id number] Date of Birth/Sex: Treating RN: 1951-01-16 (71 y.o. Gerald Williams Primary Care Rashaan Wyles: [place], [place] M Other Clinician: Referring Shelton Soler: Treating Rodel Glaspy/Extender: [facility], [place] M Weeks in Treatment: 1 Edema Assessment Assessed: [Left: No] [Right: No] Edema: [Left: Ye] [Right: s] Calf Left: Right: Point of Measurement: 32 cm From Medial Instep 35.5 cm 35.5 cm Ankle Left: Right: Point of Measurement: 9 cm From Medial Instep 21.8 cm 22 cm Vascular Assessment Pulses: Dorsalis Pedis Palpable: [Right:Yes] Electronic Signature(s) Signed: 11/08/2021 5:26:27 PM By: Redmond Pulling RN, BSN Entered By: Redmond Pulling on 11/08/2021 08:00:10 -------------------------------------------------------------------------------- Multi Wound Chart Details Patient Name: Date of Service: Gerald Williams [facility] 11/08/2021 8:00 A M Medical Record Number: [id number] Patient Account Number: [id number] Date of Birth/Sex: Treating RN: April 30, 1950 (71 y.o. Gerald Williams Primary Care Shaleah Nissley: [place], [place] M Other Clinician: Referring Aakash Hollomon: Treating Davied Nocito/Extender: [facility], [place] M Weeks in Treatment: 1 Vital Signs Height(in): 66 Pulse(bpm): 74 Weight(lbs): 177 Blood Pressure(mmHg): 141/90 Body Mass Index(BMI): 28.6 Temperature(F): 98.0 Respiratory Rate(breaths/min): 18 Photos: [1:Right Knee] [N/A:N/A N/A] Wound Location:  [1:Trauma] [N/A:N/A] Wounding Event: [1:Abrasion] [N/A:N/A] Primary Etiology: [1:Chronic Obstructive Pulmonary] [N/A:N/A] Comorbid History: [1:Disease (COPD), Sleep Apnea, Congestive Heart Failure, Coronary Artery Disease, Hypertension, Neuropathy 10/16/2021] [N/A:N/A] Date Acquired: [1:1] [N/A:N/A] Weeks of Treatment: [1:Open] [N/A:N/A] Wound Status: [1:No] [N/A:N/A] Wound Recurrence: [1:Yes] [N/A:N/A] Clustered Wound: [1:4.2x0.4x0.1] [N/A:N/A] Measurements L x W x D (cm) [1:1.319] [N/A:N/A] A (cm) : rea [1:0.132] [N/A:N/A] Volume (cm) : [1:72.00%] [N/A:N/A] % Reduction in A [1:rea: 86.00%] [N/A:N/A] % Reduction in Volume: [1:Full Thickness Without Exposed] [N/A:N/A] Classification: [1:Support Structures Medium] [N/A:N/A]  Exudate A mount: [1:Serosanguineous] [N/A:N/A] Exudate Type: [1:red, brown] [N/A:N/A] Exudate Color: [1:Distinct, outline attached] [N/A:N/A] Wound Margin: [1:Small (1-33%)] [N/A:N/A] Granulation A mount: [1:Pink] [N/A:N/A] Granulation Quality: [1:Large (67-100%)] [N/A:N/A] Necrotic A mount: [1:Eschar, Adherent Slough] [N/A:N/A] Necrotic Tissue: [1:Fat Layer (Subcutaneous Tissue): Yes N/A] Exposed Structures: [1:Fascia: No Tendon: No Muscle: No Joint: No Bone: No Large (67-100%)] [N/A:N/A] Epithelialization: [1:Debridement - Excisional] [N/A:N/A] Debridement: Pre-procedure Verification/Time Out 08:15 [N/A:N/A] Taken: [1:Lidocaine 5% topical ointment] [N/A:N/A] Pain Control: [1:Necrotic/Eschar, Subcutaneous,] [N/A:N/A] Tissue Debrided: [1:Slough Skin/Subcutaneous Tissue] [N/A:N/A] Williams: [1:1.68] [N/A:N/A] Debridement A (sq cm): [1:rea Curette] [N/A:N/A] Instrument: [1:Minimum] [N/A:N/A] Bleeding: [1:Pressure] [N/A:N/A] Hemostasis Achieved: [1:0] [N/A:N/A] Procedural Pain: [1:0] [N/A:N/A] Post Procedural Pain: Debridement Treatment Response: Procedure was tolerated well [N/A:N/A] Post Debridement Measurements L x 4.2x0.4x0.1 [N/A:N/A] W x D (cm) [1:0.132]  [N/A:N/A] Post Debridement Volume: (cm) [1:Debridement] [N/A:N/A] Treatment Notes Electronic Signature(s) Signed: 11/08/2021 8:21:54 AM By: Fredirick Maudlin MD FACS Signed: 11/08/2021 10:09:05 AM By: Dellie Catholic RN Entered By: Fredirick Maudlin on 11/08/2021 08:21:54 -------------------------------------------------------------------------------- Multi-Disciplinary Care Plan Details Patient Name: Date of Service: Gerald Williams [facility] 11/08/2021 8:00 A M Medical Record Number: [id number] Patient Account Number: [id number] Date of Birth/Sex: Treating RN: 07-30-1950 (71 y.o. Gerald Williams Primary Care Magalie Almon: [place], [place] M Other Clinician: Referring Rivka Baune: Treating Damarrion Mimbs/Extender: [facility], [place] M Weeks in Treatment: 1 Active Inactive Wound/Skin Impairment Nursing Diagnoses: Knowledge deficit related to ulceration/compromised skin integrity Goals: Patient/caregiver will verbalize understanding of skin care regimen Date Initiated: 10/29/2021 Target Resolution Date: 12/23/2021 Goal Status: Active Interventions: Provide education on ulcer and skin care Treatment Activities: Skin care regimen initiated : 10/29/2021 Notes: Electronic Signature(s) Signed: 11/08/2021 10:09:05 AM By: Dellie Catholic RN Entered By: Dellie Catholic on 11/08/2021 08:19:54 -------------------------------------------------------------------------------- Pain Assessment Details Patient Name: Date of Service: Gerald Williams [facility] 11/08/2021 8:00 A M Medical Record Number: [id number] Patient Account Number: [id number] Date of Birth/Sex: Treating RN: [birth year] (71 y.o. Gerald Williams Primary Care Catherene Kaleta: [place], [place] M Other Clinician: Referring Chandra Feger: Treating Channon Ambrosini/Extender: [facility], [place] M Weeks in Treatment: 1 Active Problems Location of Pain Severity and Description of Pain Patient Has Paino No Site Locations Pain Management and  Medication Current Pain Management: Electronic Signature(s) Signed: 11/08/2021 5:26:27 PM By: Sharyn Creamer RN, BSN Entered By: Sharyn Creamer on 11/08/2021 07:57:35 -------------------------------------------------------------------------------- Patient/Caregiver Education Details Patient Name: Date of Service: Gerald Williams [facility] 7/17/2023andnbsp8:00 [place] Number: [id number] Patient Account Number: [id number] Date of Birth/Gender: Treating RN: 1951/01/18 (71 y.o. Gerald Williams Primary Care Physician: [place], [place] M Other Clinician: Referring Physician: Treating Physician/Extender: [facility]irick [facility], [place] M Weeks in Treatment: 1 Education Assessment Education Provided To: Patient Education Topics Provided Wound/Skin Impairment: Methods: Explain/Verbal Responses: Return demonstration correctly Electronic Signature(s) Signed: 11/08/2021 10:09:05 AM By: Dellie Catholic RN Entered By: Dellie Catholic on 11/08/2021 08:20:09 -------------------------------------------------------------------------------- Wound Assessment Details Patient Name: Date of Service: Gerald Williams [facility] 11/08/2021 8:00 A M Medical Record Number: [id number] Patient Account Number: [id number] Date of Birth/Sex: Treating RN: [birth year] (71 y.o. Gerald Williams Primary Care Audianna Landgren: [place], [place] M Other Clinician: Referring Garrin Kirwan: Treating Yaniv Lage/Extender: [facility], [place] M Weeks in Treatment: 1 Wound Status Wound Number: 1 Primary Abrasion Etiology: Wound Location: Right Knee Wound Open Wounding Event: Trauma Status: Date Acquired: 10/16/2021 Notes: Cluster of 2 wounds on right knee Weeks Of Treatment: 1 Comorbid Chronic Obstructive Pulmonary Disease (COPD), Sleep Apnea, Clustered Wound: Yes History: Congestive Heart Failure, Coronary Artery Disease, Hypertension, Neuropathy Photos  Wound Measurements Length: (cm) 4.2 Width: (cm) 0.4 Depth: (cm)  0.1 Area: (cm) 1.319 Volume: (cm) 0.132 % Reduction in Area: 72% % Reduction in Volume: 86% Epithelialization: Large (67-100%) Tunneling: No Undermining: No Wound Description Classification: Full Thickness Without Exposed Support Str Wound Margin: Distinct, outline attached Exudate Amount: Medium Exudate Type: Serosanguineous Exudate Color: red, brown uctures Wound Bed Granulation Amount: Small (1-33%) Exposed Structure Granulation Quality: Pink Fascia Exposed: No Necrotic Amount: Large (67-100%) Fat Layer (Subcutaneous Tissue) Exposed: Yes Necrotic Quality: Eschar, Adherent Slough Tendon Exposed: No Muscle Exposed: No Joint Exposed: No Bone Exposed: No Treatment Notes Wound #1 (Knee) Wound Laterality: Right Cleanser Soap and Water Discharge Instruction: May shower and wash wound with dial antibacterial soap and water prior to dressing change. Wound Cleanser Discharge Instruction: Cleanse the wound with wound cleanser prior to applying a clean dressing using gauze sponges, not tissue or cotton balls. Peri-Wound Care Topical Primary Dressing KerraCel Ag Gelling Fiber Dressing, 2x2 in (silver alginate) Discharge Instruction: Apply silver alginate to wound bed as instructed Secondary Dressing Woven Gauze Sponge, Non-Sterile 4x4 in Discharge Instruction: Apply over primary dressing as directed. Zetuvit Plus Silicone Border Dressing 5x5 (in/in) Discharge Instruction: Apply silicone border over primary dressing as directed. Secured With Compression Wrap Compression Stockings Facilities manager) Signed: 11/08/2021 5:26:27 PM By: Redmond Pulling RN, BSN Entered By: Redmond Pulling on 11/08/2021 08:03:32 -------------------------------------------------------------------------------- Vitals Details Patient Name: Date of Service: Gerald Williams [facility] 11/08/2021 8:00 A M Medical Record Number: [id number] Patient Account Number: [id number] Date of  Birth/Sex: Treating RN: 02-04-1951 (71 y.o. Gerald Williams Primary Care Tyshon Fanning: [place], [place] M Other Clinician: Referring Aleksander Edmiston: Treating Jayel Inks/Extender: [facility], [place] M Weeks in Treatment: 1 Vital Signs Time Taken: 07:56 Temperature (F): 98.0 Height (in): 66 Pulse (bpm): 74 Weight (lbs): 177 Respiratory Rate (breaths/min): 18 Body Mass Index (BMI): 28.6 Blood Pressure (mmHg): 141/90 Reference Range: 80 - 120 mg / dl Electronic Signature(s) Signed: 11/08/2021 5:26:27 PM By: Redmond Pulling RN, BSN Entered By: Redmond Pulling on 11/08/2021 07:57:29

## 2021-11-08 NOTE — Progress Notes (Signed)
Gerald Williams ([id number]) Visit Report for 11/08/2021 Chief Complaint Document Details Patient Name: Date of Service: Gerald Williams [facility] 11/08/2021 8:00 A M Medical Record Number: [id number] Patient Account Number: [id number] Date of Birth/Sex: Treating RN: 12/24/1950 (71 y.o. Gerald Williams Primary Care Provider: [place], [place] M Other Clinician: Referring Provider: Treating Provider/Extender: Duanne Guess [place], [place] M Weeks in Treatment: 1 Information Obtained from: Patient Chief Complaint Patient seen for complaints of Non-Healing Wound. Electronic Signature(s) Signed: 11/08/2021 8:22:07 AM By: Duanne Guess MD FACS Entered By: Duanne Guess on 11/08/2021 08:22:07 -------------------------------------------------------------------------------- Debridement Details Patient Name: Date of Service: Gerald Williams [facility] 11/08/2021 8:00 A M Medical Record Number: [id number] Patient Account Number: [id number] Date of Birth/Sex: Treating RN: 1950/11/13 (71 y.o. Gerald Williams Primary Care Provider: [place], [place] M Other Clinician: Referring Provider: Treating Provider/Extender: Duanne Guess [facility] [place] M Weeks in Treatment: 1 Debridement Performed for Assessment: Wound #1 Right Knee Performed By: Physician Duanne Guess, MD Debridement Type: Debridement Level of Consciousness (Pre-procedure): Awake and Alert Pre-procedure Verification/Time Out Yes - 08:15 Taken: Start Time: 08:15 Pain Control: Lidocaine 5% topical ointment T Area Debrided (L x W): otal 4.2 (cm) x 0.4 (cm) = 1.68 (cm) Tissue and other material debrided: Non-Viable, Eschar, Slough, Subcutaneous, Slough Level: Skin/Subcutaneous Tissue Debridement Description: Excisional Instrument: Curette Bleeding: Minimum Hemostasis Achieved: Pressure End Time: 08:16 Procedural Pain: 0 Post Procedural Pain: 0 Response to Treatment: Procedure was tolerated well Level of Consciousness (Post- Awake and  Alert procedure): Post Debridement Measurements of Total Wound Length: (cm) 4.2 Width: (cm) 0.4 Depth: (cm) 0.1 Volume: (cm) 0.132 Character of Wound/Ulcer Post Debridement: Improved Post Procedure Diagnosis Same as Pre-procedure Electronic Signature(s) Signed: 11/08/2021 9:51:33 AM By: Duanne Guess MD FACS Signed: 11/08/2021 10:09:05 AM By: Karie Schwalbe RN Entered By: Karie Schwalbe on 11/08/2021 08:17:31 -------------------------------------------------------------------------------- HPI Details Patient Name: Date of Service: Gerald Williams [facility] 11/08/2021 8:00 A M Medical Record Number: [id number] Patient Account Number: [id number] Date of Birth/Sex: Treating RN: 28-Sep-1950 (71 y.o. 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 He initially sought care in the emergency department. No suture repair was deemed necessary. He returned about a week later with increased redness and swelling and pain. He was prescribed both doxycycline and Keflex and instructed to apply Neosporin to the wounds. He is here today for further evaluation. ABI in clinic was 1.05. He has 2 adjacent open areas on his right anterior tibial surface. They have a layer of eschar overlying them. It is soft and underneath this there is some areas of fat necrosis and slough. Periwound skin is in good condition with minimal swelling. No erythema or warmth. No odor or purulent drainage. 11/08/2021: Both open areas are quite a  bit smaller today. There is some eschar on both with a little bit of slough accumulation on the more proximal wound. No concern for infection. Electronic Signature(s) Signed: 11/08/2021 8:22:39 AM By: Duanne Guess MD FACS Entered By: Duanne Guess on 11/08/2021 08:22:39 -------------------------------------------------------------------------------- Physical Exam Details Patient Name: Date of Service: Gerald Williams [facility] 11/08/2021 8:00 A  M Medical Record Number: [id number] Patient Account Number: [id number] Date of Birth/Sex: Treating RN: [birth year] (71 y.o. Gerald Williams Primary Care Provider: [place], [place] M Other Clinician: Referring Provider: Treating Provider/Extender: Duanne Guess [place], [place] M Weeks in Treatment: 1 Constitutional Slightly hypertensive. . . . No acute distress.Marland Kitchen Respiratory Normal work of breathing on supplemental oxygen.. Notes 11/08/2021: Both open areas are quite a bit smaller today. There is some eschar on both with a little bit of slough accumulation on the more proximal wound. No concern for infection. Electronic Signature(s) Signed: 11/08/2021 8:23:12 AM By: Duanne Guess MD FACS Entered By: Duanne Guess on 11/08/2021 08:23:12 -------------------------------------------------------------------------------- Physician Orders Details Patient Name: Date of Service: Gerald Williams [facility] 11/08/2021 8:00 A M Medical Record Number: [id number] Patient Account Number: [id number] Date of Birth/Sex: Treating RN: 10-16-1950 (71 y.o. Gerald Williams Primary Care Provider: [place], [place] M Other Clinician: Referring Provider: Treating Provider/Extender: Duanne Guess [facility] [place] M Weeks in Treatment: 1 Verbal / Phone Orders: No Diagnosis Coding ICD-10 Coding Code Description [phone] Non-pressure chronic ulcer of other part of right lower leg with fat layer exposed S81.811A Laceration without foreign body, right lower leg, initial  encounter J44.9 Chronic obstructive pulmonary disease, unspecified J96.11 Chronic respiratory failure with hypoxia I20.8 Other forms of angina pectoris I10 Essential (primary) hypertension G62.9 Polyneuropathy, unspecified I50.20 Unspecified systolic (congestive) heart failure Follow-up Appointments ppointment in 1 week. - Dr. Lady Gary Room 3 Monday July 24th at 10:15am Return A Bathing/ Shower/ Hygiene Other Bathing/Shower/Hygiene Orders/Instructions: - Please change wound dressing every day. Thanks ! Off-Loading Other: - Please elevate legs throughout the day. Wound Treatment Wound #1 - Knee Wound Laterality: Right Cleanser: Soap and Water 1 x Per Day/30 Days Discharge Instructions: May shower and wash wound with dial antibacterial soap and water prior to dressing change. Cleanser: Wound Cleanser (Generic) 1 x Per Day/30 Days Discharge Instructions: Cleanse the wound with wound cleanser prior to applying a clean dressing using gauze sponges, not tissue or cotton balls. Prim Dressing: KerraCel Ag Gelling Fiber Dressing, 2x2 in (silver alginate) (Generic) 1 x Per Day/30 Days ary Discharge Instructions: Apply silver alginate to wound bed as instructed Secondary Dressing: Woven Gauze Sponge, Non-Sterile 4x4 in (Generic) 1 x Per Day/30 Days Discharge Instructions: Apply over primary dressing as directed. Secondary Dressing: Zetuvit Plus Silicone Border Dressing 5x5 (in/in) (Generic) 1 x Per Day/30 Days Discharge Instructions: Apply silicone border over primary dressing as directed. Electronic Signature(s) Signed: 11/08/2021 9:51:33 AM By: Duanne Guess MD FACS Entered By: Duanne Guess on 11/08/2021 08:23:25 -------------------------------------------------------------------------------- Problem List Details Patient Name: Date of Service: Gerald Williams [facility] 11/08/2021 8:00 A M Medical Record Number: [id number] Patient Account Number: [id number] Date of Birth/Sex: Treating  RN: 09-18-1950 (71 y.o. Gerald Williams Primary Care Provider: [place], [place] M Other Clinician: Referring Provider: Treating Provider/Extender: Duanne Guess [place], [place] M Weeks in Treatment: 1 Active Problems ICD-10 Encounter Code Description Active Date MDM Diagnosis [phone] Non-pressure chronic ulcer of other part of right lower leg with fat layer 10/29/2021 No Yes exposed S81.811A Laceration without foreign body, right lower leg, initial encounter 10/29/2021 No Yes J44.9 Chronic obstructive pulmonary disease, unspecified 10/29/2021 No Yes J96.11 Chronic respiratory failure with hypoxia 10/29/2021 No Yes I20.8 Other forms of angina pectoris 10/29/2021 No Yes I10 Essential (primary) hypertension 10/29/2021 No Yes G62.9 Polyneuropathy, unspecified 10/29/2021 No Yes I50.20 Unspecified systolic (congestive) heart failure 10/29/2021 No Yes Inactive Problems Resolved Problems Electronic Signature(s) Signed: 11/08/2021 8:21:46 AM By: Duanne Guess MD FACS  Entered By: Duanne Guess on 11/08/2021 08:21:46 -------------------------------------------------------------------------------- Progress Note Details Patient Name: Date of Service: Gerald Williams [facility] 11/08/2021 8:00 A M Medical Record Number: [id number] Patient Account Number: [id number] Date of Birth/Sex: Treating RN: [birth year] (71 y.o. Gerald Williams Primary Care Provider: [place], [place] M Other Clinician: Referring Provider: Treating Provider/Extender: Duanne Guess [place], [place] M Weeks in Treatment: 1 Subjective Chief Complaint Information obtained from Patient Patient seen for complaints of Non-Healing Wound. 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 He initially sought care in the emergency department. No suture repair was deemed necessary. He returned about a week later with increased redness and swelling and pain. He was prescribed both doxycycline and Keflex and instructed to apply Neosporin to the wounds. He is here today for further evaluation. ABI in clinic was 1.05. He has 2 adjacent open areas on his right anterior tibial surface. They have a layer of eschar overlying them. It is soft and underneath this there is some areas of fat necrosis and slough. Periwound skin is in good condition with minimal swelling. No erythema or warmth. No odor or purulent drainage. 11/08/2021: Both open areas are quite a bit smaller today. There is some eschar on both with a little bit of slough accumulation on the more proximal wound. No concern for infection. Patient History Information obtained from Patient. Family History Unknown History. Social History Former smoker, Marital Status - Divorced, Alcohol Use - Rarely - Not now, Drug Use - Prior History - Not now, Caffeine Use - Daily. Medical History Respiratory Patient has history of Chronic Obstructive Pulmonary Disease (COPD) - Oxygen dependent, Sleep Apnea - Obstructive Sleep apnea Cardiovascular Patient has history of Congestive Heart Failure, Coronary Artery Disease, Hypertension Neurologic Patient has history of Neuropathy - Peripheral neuropathy Medical A Surgical History Notes nd Psychiatric Has PTSD Objective Constitutional Slightly hypertensive. No acute distress.. Vitals Time Taken: 7:56 AM, Height: 66 in, Weight: 177 lbs, BMI: 28.6, Temperature: 98.0 F, Pulse: 74 bpm, Respiratory Rate: 18 breaths/min, Blood Pressure: 141/90 mmHg. Respiratory Normal work of breathing on supplemental oxygen.. General Notes: 11/08/2021: Both open areas are quite a bit smaller today. There is some eschar on both with a little bit of slough  accumulation on the more proximal wound. No concern for infection. Integumentary (Hair, Skin) Wound #1 status is Open. Original cause of wound was Trauma. The date acquired was: 10/16/2021. The wound has been in treatment 1 weeks. The wound is located on the Right Knee. The wound measures 4.2cm length x 0.4cm width x 0.1cm depth; 1.319cm^2 area and 0.132cm^3 volume. There is Fat Layer (Subcutaneous Tissue) exposed. There is no tunneling or undermining noted. There is a medium amount of serosanguineous drainage noted. The wound margin is distinct with the outline attached to the wound base. There is small (1-33%) pink granulation within the wound bed. There is a large (67-100%) amount of necrotic tissue within the wound bed including Eschar and Adherent Slough. Assessment Active Problems ICD-10 Non-pressure chronic ulcer of other part of right lower leg with fat layer exposed Laceration without foreign body, right lower leg, initial encounter Chronic obstructive pulmonary disease, unspecified Chronic respiratory failure with hypoxia Other forms of angina pectoris Essential (primary) hypertension Polyneuropathy, unspecified  Unspecified systolic (congestive) heart failure Procedures Wound #1 Pre-procedure diagnosis of Wound #1 is an Abrasion located on the Right Knee . There was a Excisional Skin/Subcutaneous Tissue Debridement with a total area of 1.68 sq cm performed by Duanne Guess, MD. With the following instrument(s): Curette to remove Non-Viable tissue/material. Material removed includes Eschar, Subcutaneous Tissue, and Slough after achieving pain control using Lidocaine 5% topical ointment. No specimens were taken. A time out was conducted at 08:15, prior to the start of the procedure. A Minimum amount of bleeding was controlled with Pressure. The procedure was tolerated well with a pain level of 0 throughout and a pain level of 0 following the procedure. Post Debridement  Measurements: 4.2cm length x 0.4cm width x 0.1cm depth; 0.132cm^3 volume. Character of Wound/Ulcer Post Debridement is improved. Post procedure Diagnosis Wound #1: Same as Pre-Procedure Plan Follow-up Appointments: Return Appointment in 1 week. - Dr. Lady Gary Room 3 Monday July 24th at 10:15am Bathing/ Shower/ Hygiene: Other Bathing/Shower/Hygiene Orders/Instructions: - Please change wound dressing every day. Thanks ! Off-Loading: Other: - Please elevate legs throughout the day. WOUND #1: - Knee Wound Laterality: Right Cleanser: Soap and Water 1 x Per Day/30 Days Discharge Instructions: May shower and wash wound with dial antibacterial soap and water prior to dressing change. Cleanser: Wound Cleanser (Generic) 1 x Per Day/30 Days Discharge Instructions: Cleanse the wound with wound cleanser prior to applying a clean dressing using gauze sponges, not tissue or cotton balls. Prim Dressing: KerraCel Ag Gelling Fiber Dressing, 2x2 in (silver alginate) (Generic) 1 x Per Day/30 Days ary Discharge Instructions: Apply silver alginate to wound bed as instructed Secondary Dressing: Woven Gauze Sponge, Non-Sterile 4x4 in (Generic) 1 x Per Day/30 Days Discharge Instructions: Apply over primary dressing as directed. Secondary Dressing: Zetuvit Plus Silicone Border Dressing 5x5 (in/in) (Generic) 1 x Per Day/30 Days Discharge Instructions: Apply silicone border over primary dressing as directed. 11/08/2021: Both open areas are quite a bit smaller today. There is some eschar on both with a little bit of slough accumulation on the more proximal wound. No concern for infection. I used a curette to debride eschar from both wounds along with some slough and nonviable subcutaneous tissue from the larger more proximal wound. We will continue using silver alginate with a foam border dressing. Follow-up in 1 week. Electronic Signature(s) Signed: 11/08/2021 8:23:52 AM By: Duanne Guess MD FACS Entered By: Duanne Guess on 11/08/2021 08:23:52 -------------------------------------------------------------------------------- HxROS Details Patient Name: Date of Service: Gerald Williams [facility] 11/08/2021 8:00 A M Medical Record Number: [id number] Patient Account Number: [id number] Date of Birth/Sex: Treating RN: Dec 18, 1950 (71 y.o. Gerald Williams Primary Care Provider: [place], [place] M Other Clinician: Referring Provider: Treating Provider/Extender: Duanne Guess [place] [place] M Weeks in Treatment: 1 Information Obtained From Patient Respiratory Medical History: Positive for: Chronic Obstructive Pulmonary Disease (COPD) - Oxygen dependent; Sleep Apnea - Obstructive Sleep apnea Cardiovascular Medical History: Positive for: Congestive Heart Failure; Coronary Artery Disease; Hypertension Neurologic Medical History: Positive for: Neuropathy - Peripheral neuropathy Psychiatric Medical History: Past Medical History Notes: Has PTSD Immunizations Pneumococcal Vaccine: Received Pneumococcal Vaccination: Yes Received Pneumococcal Vaccination On or After 60th Birthday: Yes Implantable Devices None Family and Social History Unknown History: Yes; Former smoker; Marital Status - Divorced; Alcohol Use: Rarely - Not now; Drug Use: Prior History - Not now; Caffeine Use: Daily; Financial Concerns: No; Food, Clothing or Shelter Needs: No; Support System Lacking: No; Transportation Concerns: No Psychologist, prison and probation services) Signed: 11/08/2021 9:51:33 AM By: Duanne Guess MD  FACS Signed: 11/08/2021 10:09:05 AM By: Karie Schwalbe RN Entered By: Duanne Guess on 11/08/2021 08:22:48 -------------------------------------------------------------------------------- SuperBill Details Patient Name: Date of Service: Gerald Williams [facility] 11/08/2021 Medical Record Number: [id number] Patient Account Number: [id number] Date of Birth/Sex: Treating RN: November 30, 1950 (71 y.o. Gerald Williams Primary Care Provider:  [place], [place] M Other Clinician: Referring Provider: Treating Provider/Extender: Duanne Guess [facility] [place] M Weeks in Treatment: 1 Diagnosis Coding ICD-10 Codes Code Description [phone] Non-pressure chronic ulcer of other part of right lower leg with fat layer exposed S81.811A Laceration without foreign body, right lower leg, initial encounter J44.9 Chronic obstructive pulmonary disease, unspecified J96.11 Chronic respiratory failure with hypoxia I20.8 Other forms of angina pectoris I10 Essential (primary) hypertension G62.9 Polyneuropathy, unspecified I50.20 Unspecified systolic (congestive) heart failure Facility Procedures Physician Procedures : CPT4 Code Description Modifier [id number] - WC PHYS LEVEL 3 - EST PT 25 ICD-10 Diagnosis Description L97.812 Non-pressure chronic ulcer of other part of right lower leg with fat layer exposed S81.811A Laceration without foreign body, right lower  leg, initial encounter I50.20 Unspecified systolic (congestive) heart failure J96.11 Chronic respiratory failure with hypoxia Quantity: 1 : [id number] - WC PHYS SUBQ TISS 20 SQ CM ICD-10 Diagnosis Description L97.812 Non-pressure chronic ulcer of other part of right lower leg with fat layer exposed Quantity: 1 Electronic Signature(s) Signed: 11/08/2021 8:24:23 AM By: Duanne Guess MD FACS Entered By: Duanne Guess on 11/08/2021 98:92:11

## 2021-11-08 NOTE — Progress Notes (Signed)
Discharge Progress Report  Patient Details  Name: Gerald Williams MRN: [id number] Date of Birth: [birth year] Referring Provider:   Flowsheet Row PULMONARY REHAB COPD ORIENTATION from 07/05/2021 in [facility] CARDIAC REHABILITATION  Referring Provider Dr. Maryellen Pile        Number of Visits: 23  Reason for Discharge:  Early Exit:  Personal  Smoking History:  Social History   Tobacco Use  Smoking Status Former   Packs/day: 1.50   Years: 50.00   Total pack years: 75.00   Types: Cigarettes   Start date: 04/26/1963   Quit date: 09/22/2013   Years since quitting: 8.1  Smokeless Tobacco Never  Tobacco Comments   patient using the patch, losenges, and medication    Diagnosis:  Chronic obstructive pulmonary disease, unspecified COPD type (HCC)  ADL UCSD:  Pulmonary Assessment Scores     Row Name 07/05/21 1353         ADL UCSD   ADL Phase Entry     SOB Score total 70     Rest 4     Walk 3     Stairs 2     Bath 3     Dress 3     Shop 3       CAT Score   CAT Score 24       mMRC Score   mMRC Score 3              Initial Exercise Prescription:  Initial Exercise Prescription - 07/05/21 1400       Date of Initial Exercise RX and Referring Provider   Date 07/05/21    Referring Provider Dr. Maryellen Pile    Expected Discharge Date 11/09/21      Oxygen   Oxygen Continuous    Liters 4    Maintain Oxygen Saturation 88% or higher      NuStep   Level 1    SPM 80    Minutes 22      Arm Ergometer   Level 1    RPM 60    Minutes 17      Prescription Details   Frequency (times per week) 2    Duration Progress to 30 minutes of continuous aerobic without signs/symptoms of physical distress      Intensity   THRR 40-80% of Max Heartrate 60-121    Ratings of Perceived Exertion 11-13    Perceived Dyspnea 0-4      Resistance Training   Training Prescription Yes    Weight 3    Reps 10-15             Discharge Exercise Prescription (Final Exercise  Prescription Changes):  Exercise Prescription Changes - 10/28/21 1500       Response to Exercise   Blood Pressure (Admit) 126/64    Blood Pressure (Exercise) 140/78    Blood Pressure (Exit) 130/76    Heart Rate (Admit) 108 bpm    Heart Rate (Exercise) 115 bpm    Heart Rate (Exit) 110 bpm    Oxygen Saturation (Admit) 94 %    Oxygen Saturation (Exercise) 99 %    Oxygen Saturation (Exit) 99 %    Rating of Perceived Exertion (Exercise) 14    Perceived Dyspnea (Exercise) 14    Duration Continue with 30 min of aerobic exercise without signs/symptoms of physical distress.    Intensity THRR unchanged      Progression   Progression Continue to progress workloads to maintain intensity without signs/symptoms of physical distress.  Resistance Training   Training Prescription Yes    Weight 3    Reps 10-15    Time 10 Minutes      Oxygen   Oxygen Continuous    Liters 6      NuStep   Level 2    SPM 75    Minutes 22    METs 1.9      Arm Ergometer   Level 3    RPM 58    Minutes 17    METs 3             Functional Capacity:  6 Minute Walk     Row Name 07/05/21 1404         6 Minute Walk   Phase Initial     Distance 925 feet     Walk Time 6 minutes     # of Rest Breaks 1     MPH 1.75     METS 2.54     RPE 17     Perceived Dyspnea  13     VO2 Peak 8.9     Symptoms Yes (comment)     Comments 1 standing rest break for 30 sec due to SOB     Resting HR 84 bpm     Resting BP 144/88     Resting Oxygen Saturation  100 %     Exercise Oxygen Saturation  during 6 min walk 93 %     Max Ex. HR 113 bpm     Max Ex. BP 150/98     2 Minute Post BP 140/98       Interval HR   1 Minute HR 93     2 Minute HR 95     3 Minute HR 100     4 Minute HR 107     5 Minute HR 113     6 Minute HR 110     2 Minute Post HR 97     Interval Heart Rate? Yes       Interval Oxygen   Interval Oxygen? Yes     Baseline Oxygen Saturation % 100 %     1 Minute Oxygen Saturation % 99 %      1 Minute Liters of Oxygen 4 L     2 Minute Oxygen Saturation % 95 %     2 Minute Liters of Oxygen 4 L     3 Minute Oxygen Saturation % 97 %     3 Minute Liters of Oxygen 4 L     4 Minute Oxygen Saturation % 96 %     4 Minute Liters of Oxygen 4 L     5 Minute Oxygen Saturation % 94 %     5 Minute Liters of Oxygen 4 L     6 Minute Oxygen Saturation % 93 %     6 Minute Liters of Oxygen 4 L     2 Minute Post Oxygen Saturation % 99 %     2 Minute Post Liters of Oxygen 4 L              Psychological, QOL, Others - Outcomes: PHQ 2/9:    07/05/2021    1:47 PM 03/04/2021    8:18 AM 10/23/2017    9:41 AM 07/13/2017   11:14 AM 05/23/2017   10:08 AM  Depression screen PHQ 2/9  Decreased Interest 1 0 0 0 0  Down, Depressed, Hopeless 1 2 0 1 0  PHQ -  2 Score 2 2 0 1 0  Altered sleeping 2 2  1    Tired, decreased energy 2 3  0   Change in appetite 1 2  0   Feeling bad or failure about yourself  0 0  0   Trouble concentrating 1 0  0   Moving slowly or fidgety/restless 0 1  0   Suicidal thoughts 0 0  0   PHQ-9 Score 8 10  2    Difficult doing work/chores Somewhat difficult Somewhat difficult  Not difficult at all     Quality of Life:  Quality of Life - 07/05/21 1403       Quality of Life   Select Quality of Life      Quality of Life Scores   Health/Function Pre 18.9 %    Socioeconomic Pre 19.08 %    Psych/Spiritual Pre 29.14 %    Family Pre 14.25 %    GLOBAL Pre 20.59 %             Personal Goals: Goals established at orientation with interventions provided to work toward goal.  Personal Goals and Risk Factors at Admission - 07/05/21 1350       Core Components/Risk Factors/Patient Goals on Admission    Weight Management Weight Maintenance    Improve shortness of breath with ADL's Yes    Intervention Provide education, individualized exercise plan and daily activity instruction to help decrease symptoms of SOB with activities of daily living.    Expected Outcomes  Short Term: Improve cardiorespiratory fitness to achieve a reduction of symptoms when performing ADLs;Long Term: Be able to perform more ADLs without symptoms or delay the onset of symptoms    Personal Goal Other Yes    Personal Goal Patient wants to strength his lungs getting ready to have lung reduction surgery. He would like to improve his breathing overall and lower oxygen liter flow to 3 to 4 L/M.    Intervention Paitent will attend the PR program with exercise and education and supplement with exercise at home.    Expected Outcomes Patient complete the program meeting both personal and program goals.              Personal Goals Discharge:  Goals and Risk Factor Review     Row Name 07/12/21 1454 08/10/21 1414 09/06/21 0804 10/04/21 1510       Core Components/Risk Factors/Patient Goals Review   Personal Goals Review Improve shortness of breath with ADL's;Increase knowledge of respiratory medications and ability to use respiratory devices properly.;Develop more efficient breathing techniques such as purse lipped breathing and diaphragmatic breathing and practicing self-pacing with activity. Improve shortness of breath with ADL's;Increase knowledge of respiratory medications and ability to use respiratory devices properly.;Develop more efficient breathing techniques such as purse lipped breathing and diaphragmatic breathing and practicing self-pacing with activity. Improve shortness of breath with ADL's;Increase knowledge of respiratory medications and ability to use respiratory devices properly.;Develop more efficient breathing techniques such as purse lipped breathing and diaphragmatic breathing and practicing self-pacing with activity. Improve shortness of breath with ADL's;Increase knowledge of respiratory medications and ability to use respiratory devices properly.;Develop more efficient breathing techniques such as purse lipped breathing and diaphragmatic breathing and practicing  self-pacing with activity.    Review Patient is new to program attending 1 sessions (also he is starting over again). He is referred to PR by Dr. Spero Curb with COPD .  His personal goals for the program are to get stronger, improve SOB with activity,  breath better and get lungs stronger for lung reduction surgery.   We will continue to monitor his progress as he works toward meeting these goals.  O2 Sat during exercise averages at 100% with O2  in progress at 6L via nasal cannul.  We will encouage him to decrease  liters of oxygen while he works toward meeting his goals. Patient has completed 8 sessions. Maintiaining his weight at 80.5kg.  He continues to use Oxygen at 6 L via nasal cannule.  He as been instructed several time to try and decrease his oxygen level. O2 sat average during exercise at 97%-100% .  We will encouage him to decrease  liters of oxygen while he works toward meeting his goals.  His personal goals for the program are to get stronger, improve SOB with activity, breath better and get lungs stronger for lung reduction surgery.   We will continue to monitor his progress as he works toward meeting these goals. We will encouage him to decrease  liters of oxygen while he works toward meeting his goals. Patient has completed 15 sessions. Maintiaining his weight at 80.2kg.  He continues to use Oxygen at 6 L via nasal cannule during class sessions and tolerates well.  He as been instructed several time to try and decrease his oxygen level. O2 Sats average during exercise at 97%-100% on 4-5 liters .  We will continue to encouage him to decrease  liters of oxygen while he works toward meeting his goals.  His personal goals for the program are to get stronger, improve SOB with activity, breath better and get lungs stronger for lung reduction surgery.   We will continue to monitor his progress as he works toward meeting these goals. We will encouage him to decrease  liters of oxygen while he works toward  meeting his goals. Patient has completed 20 sessions. Maintiaining his weight at 80.6kg.  He continues to use Oxygen at 6 L/M via nasal cannule during class sessions and tolerates well.  He as been instructed several time to try and decrease his oxygen level. O2 Sats average during exercise at 97%-100% on 4-5 liters .  We will continue to encouage him to decrease  liters of oxygen while he works toward meeting his goals.  His personal goals for the program are to get stronger, improve SOB with activity, breathe better and get lungs stronger for lung reduction surgery.   We will continue to monitor his progress as he works toward meeting these goals. We will encouage him to decrease  liters of oxygen while he works toward meeting his goals.    Expected Outcomes Patient will complete the PR meeting both program and personal goals. Patient will complete the PR meeting both program and personal goals. Patient will complete the PR meeting both program and personal goals. Patient will complete the PR meeting both program and personal goals.             Exercise Goals and Review:  Exercise Goals     Row Name 07/05/21 1407 07/20/21 1249 08/17/21 1250 09/14/21 1314 10/12/21 1233     Exercise Goals   Increase Physical Activity Yes Yes Yes Yes Yes   Intervention Provide advice, education, support and counseling about physical activity/exercise needs.;Develop an individualized exercise prescription for aerobic and resistive training based on initial evaluation findings, risk stratification, comorbidities and participant's personal goals. Provide advice, education, support and counseling about physical activity/exercise needs.;Develop an individualized exercise prescription for aerobic and resistive training based on  initial evaluation findings, risk stratification, comorbidities and participant's personal goals. Provide advice, education, support and counseling about physical activity/exercise needs.;Develop an  individualized exercise prescription for aerobic and resistive training based on initial evaluation findings, risk stratification, comorbidities and participant's personal goals. Provide advice, education, support and counseling about physical activity/exercise needs.;Develop an individualized exercise prescription for aerobic and resistive training based on initial evaluation findings, risk stratification, comorbidities and participant's personal goals. Provide advice, education, support and counseling about physical activity/exercise needs.;Develop an individualized exercise prescription for aerobic and resistive training based on initial evaluation findings, risk stratification, comorbidities and participant's personal goals.   Expected Outcomes Short Term: Attend rehab on a regular basis to increase amount of physical activity.;Long Term: Exercising regularly at least 3-5 days a week.;Long Term: Add in home exercise to make exercise part of routine and to increase amount of physical activity. Short Term: Attend rehab on a regular basis to increase amount of physical activity.;Long Term: Exercising regularly at least 3-5 days a week.;Long Term: Add in home exercise to make exercise part of routine and to increase amount of physical activity. Short Term: Attend rehab on a regular basis to increase amount of physical activity.;Long Term: Exercising regularly at least 3-5 days a week.;Long Term: Add in home exercise to make exercise part of routine and to increase amount of physical activity. Short Term: Attend rehab on a regular basis to increase amount of physical activity.;Long Term: Exercising regularly at least 3-5 days a week.;Long Term: Add in home exercise to make exercise part of routine and to increase amount of physical activity. Short Term: Attend rehab on a regular basis to increase amount of physical activity.;Long Term: Exercising regularly at least 3-5 days a week.;Long Term: Add in home exercise to  make exercise part of routine and to increase amount of physical activity.   Increase Strength and Stamina Yes -- Yes Yes Yes   Intervention Provide advice, education, support and counseling about physical activity/exercise needs.;Develop an individualized exercise prescription for aerobic and resistive training based on initial evaluation findings, risk stratification, comorbidities and participant's personal goals. Provide advice, education, support and counseling about physical activity/exercise needs.;Develop an individualized exercise prescription for aerobic and resistive training based on initial evaluation findings, risk stratification, comorbidities and participant's personal goals. Provide advice, education, support and counseling about physical activity/exercise needs.;Develop an individualized exercise prescription for aerobic and resistive training based on initial evaluation findings, risk stratification, comorbidities and participant's personal goals. Provide advice, education, support and counseling about physical activity/exercise needs.;Develop an individualized exercise prescription for aerobic and resistive training based on initial evaluation findings, risk stratification, comorbidities and participant's personal goals. Provide advice, education, support and counseling about physical activity/exercise needs.;Develop an individualized exercise prescription for aerobic and resistive training based on initial evaluation findings, risk stratification, comorbidities and participant's personal goals.   Expected Outcomes Short Term: Increase workloads from initial exercise prescription for resistance, speed, and METs.;Short Term: Perform resistance training exercises routinely during rehab and add in resistance training at home;Long Term: Improve cardiorespiratory fitness, muscular endurance and strength as measured by increased METs and functional capacity (6MWT) Short Term: Increase workloads from  initial exercise prescription for resistance, speed, and METs.;Short Term: Perform resistance training exercises routinely during rehab and add in resistance training at home;Long Term: Improve cardiorespiratory fitness, muscular endurance and strength as measured by increased METs and functional capacity (6MWT) Short Term: Increase workloads from initial exercise prescription for resistance, speed, and METs.;Short Term: Perform resistance training exercises routinely during rehab and  add in resistance training at home;Long Term: Improve cardiorespiratory fitness, muscular endurance and strength as measured by increased METs and functional capacity (6MWT) Short Term: Increase workloads from initial exercise prescription for resistance, speed, and METs.;Short Term: Perform resistance training exercises routinely during rehab and add in resistance training at home;Long Term: Improve cardiorespiratory fitness, muscular endurance and strength as measured by increased METs and functional capacity (6MWT) Short Term: Increase workloads from initial exercise prescription for resistance, speed, and METs.;Short Term: Perform resistance training exercises routinely during rehab and add in resistance training at home;Long Term: Improve cardiorespiratory fitness, muscular endurance and strength as measured by increased METs and functional capacity (6MWT)   Able to understand and use rate of perceived exertion (RPE) scale Yes Yes Yes Yes Yes   Intervention Provide education and explanation on how to use RPE scale Provide education and explanation on how to use RPE scale Provide education and explanation on how to use RPE scale Provide education and explanation on how to use RPE scale Provide education and explanation on how to use RPE scale   Expected Outcomes Short Term: Able to use RPE daily in rehab to express subjective intensity level;Long Term:  Able to use RPE to guide intensity level when exercising independently Short  Term: Able to use RPE daily in rehab to express subjective intensity level;Long Term:  Able to use RPE to guide intensity level when exercising independently Short Term: Able to use RPE daily in rehab to express subjective intensity level;Long Term:  Able to use RPE to guide intensity level when exercising independently Short Term: Able to use RPE daily in rehab to express subjective intensity level;Long Term:  Able to use RPE to guide intensity level when exercising independently Short Term: Able to use RPE daily in rehab to express subjective intensity level;Long Term:  Able to use RPE to guide intensity level when exercising independently   Able to understand and use Dyspnea scale Yes Yes Yes Yes Yes   Intervention Provide education and explanation on how to use Dyspnea scale Provide education and explanation on how to use Dyspnea scale Provide education and explanation on how to use Dyspnea scale Provide education and explanation on how to use Dyspnea scale Provide education and explanation on how to use Dyspnea scale   Expected Outcomes Short Term: Able to use Dyspnea scale daily in rehab to express subjective sense of shortness of breath during exertion;Long Term: Able to use Dyspnea scale to guide intensity level when exercising independently Short Term: Able to use Dyspnea scale daily in rehab to express subjective sense of shortness of breath during exertion;Long Term: Able to use Dyspnea scale to guide intensity level when exercising independently Short Term: Able to use Dyspnea scale daily in rehab to express subjective sense of shortness of breath during exertion;Long Term: Able to use Dyspnea scale to guide intensity level when exercising independently Short Term: Able to use Dyspnea scale daily in rehab to express subjective sense of shortness of breath during exertion;Long Term: Able to use Dyspnea scale to guide intensity level when exercising independently Short Term: Able to use Dyspnea scale  daily in rehab to express subjective sense of shortness of breath during exertion;Long Term: Able to use Dyspnea scale to guide intensity level when exercising independently   Knowledge and understanding of Target Heart Rate Range (THRR) Yes Yes Yes Yes Yes   Intervention Provide education and explanation of THRR including how the numbers were predicted and where they are located for reference Provide  education and explanation of THRR including how the numbers were predicted and where they are located for reference Provide education and explanation of THRR including how the numbers were predicted and where they are located for reference Provide education and explanation of THRR including how the numbers were predicted and where they are located for reference Provide education and explanation of THRR including how the numbers were predicted and where they are located for reference   Expected Outcomes Short Term: Able to state/look up THRR;Long Term: Able to use THRR to govern intensity when exercising independently;Short Term: Able to use daily as guideline for intensity in rehab Short Term: Able to state/look up THRR;Long Term: Able to use THRR to govern intensity when exercising independently;Short Term: Able to use daily as guideline for intensity in rehab Short Term: Able to state/look up THRR;Long Term: Able to use THRR to govern intensity when exercising independently;Short Term: Able to use daily as guideline for intensity in rehab Short Term: Able to state/look up THRR;Long Term: Able to use THRR to govern intensity when exercising independently;Short Term: Able to use daily as guideline for intensity in rehab Short Term: Able to state/look up THRR;Long Term: Able to use THRR to govern intensity when exercising independently;Short Term: Able to use daily as guideline for intensity in rehab   Understanding of Exercise Prescription Yes Yes Yes Yes Yes   Intervention Provide education, explanation, and  written materials on patient's individual exercise prescription Provide education, explanation, and written materials on patient's individual exercise prescription Provide education, explanation, and written materials on patient's individual exercise prescription Provide education, explanation, and written materials on patient's individual exercise prescription Provide education, explanation, and written materials on patient's individual exercise prescription   Expected Outcomes Short Term: Able to explain program exercise prescription;Long Term: Able to explain home exercise prescription to exercise independently Short Term: Able to explain program exercise prescription;Long Term: Able to explain home exercise prescription to exercise independently Short Term: Able to explain program exercise prescription;Long Term: Able to explain home exercise prescription to exercise independently Short Term: Able to explain program exercise prescription;Long Term: Able to explain home exercise prescription to exercise independently Short Term: Able to explain program exercise prescription;Long Term: Able to explain home exercise prescription to exercise independently            Exercise Goals Re-Evaluation:  Exercise Goals Re-Evaluation     Row Name 07/20/21 1249 08/17/21 1250 09/14/21 1314 10/12/21 1234       Exercise Goal Re-Evaluation   Exercise Goals Review Increase Physical Activity;Increase Strength and Stamina;Able to understand and use rate of perceived exertion (RPE) scale;Able to understand and use Dyspnea scale;Knowledge and understanding of Target Heart Rate Range (THRR);Understanding of Exercise Prescription Increase Physical Activity;Increase Strength and Stamina;Able to understand and use rate of perceived exertion (RPE) scale;Able to understand and use Dyspnea scale;Knowledge and understanding of Target Heart Rate Range (THRR);Understanding of Exercise Prescription Increase Physical  Activity;Increase Strength and Stamina;Able to understand and use rate of perceived exertion (RPE) scale;Able to understand and use Dyspnea scale;Knowledge and understanding of Target Heart Rate Range (THRR);Able to check pulse independently;Understanding of Exercise Prescription Increase Physical Activity;Increase Strength and Stamina;Able to understand and use rate of perceived exertion (RPE) scale;Able to understand and use Dyspnea scale;Knowledge and understanding of Target Heart Rate Range (THRR);Understanding of Exercise Prescription    Comments Pt has completed 4 sesisons of pulmonary rehab. Pt is deconditioned ans will progress slowly. He is motivated to decrease his use of oxygen but  on his "bad" days and when he feels SOB he will increase his oxygen from 4L to 6L. He is currently exercising at 2.0 METs on the AE. Will continue to monitor and progress as able. Pt has completed 10 seesions of pulmonary rehab. Pt is increasing his workload and is motivated to decrease his oxygen. His is still increasing his oxygen to 6L and using inhaler during exercise. He is currently exercising at 3.1 METs on the AE. Will continue to monitor and progress as able. Pt has completed 16 sessions of pulmonary rehab. Pt is slowly starting to increase his workload but does not push himself. He still increases his oxygen to 6 liters while in class when his chart says 4 liters. He is currently exercising at 3.1 METs on the AE. Will continue to monitor and progress as able. Pt has completed 21 sessions of cardiac rehab. He continues to slowly progress his workload and has started to walk on the treadmill. His oxygen remians at 6 liters with his oxygen 100%. He is currently exercising at 3.3 METs on the arm ergometer. Will continue to monitor and progress as able.    Expected Outcomes Through exercise at rehab and at home, the patient will meet their stated goals. Through exercise at rehab and at home, the patient will meet their  stated goals. Through exercise at rehab and at home, the patient will meet their stated goals. Through exercise at rehab and at home, the patient will meet their stated goals.             Nutrition & Weight - Outcomes:  Pre Biometrics - 07/05/21 1408       Pre Biometrics   Height 5' 6.5" (1.689 m)    Weight 176 lb 12.9 oz (80.2 kg)    Waist Circumference 44 inches    Hip Circumference 42.5 inches    Waist to Hip Ratio 1.04 %    BMI (Calculated) 28.11    Triceps Skinfold 20 mm    % Body Fat 31 %    Grip Strength 37.5 kg    Flexibility 4.5 in    Single Leg Stand 2.72 seconds              Nutrition:  Nutrition Therapy & Goals - 08/10/21 1411       Personal Nutrition Goals   Comments We offer 2 educational sessions regarding heart healthy nutrition with handouts and assistance with RD referral if patient is interested.  Handout provided and explained regarding healthier choices and assesment discussed.  Patient verbalized understanding.      Intervention Plan   Intervention Nutrition handout(s) given to patient.    Expected Outcomes Short Term Goal: Understand basic principles of dietary content, such as calories, fat, sodium, cholesterol and nutrients.             Nutrition Discharge:  Nutrition Assessments - 07/05/21 1347       MEDFICTS Scores   Pre Score 57             Education Questionnaire Score:  Knowledge Questionnaire Score - 07/05/21 1349       Knowledge Questionnaire Score   Pre Score 12/18             Pt discharged from [facility] on 11/08/2021 after 23 sessions. 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 This is the second time this calendar year that he has dropped out of  the [facility] program.

## 2021-11-09 ENCOUNTER — Encounter (HOSPITAL_COMMUNITY): Payer: No Typology Code available for payment source

## 2021-11-15 ENCOUNTER — Encounter (HOSPITAL_BASED_OUTPATIENT_CLINIC_OR_DEPARTMENT_OTHER): Payer: Medicare Other | Admitting: General Surgery

## 2021-11-23 ENCOUNTER — Other Ambulatory Visit: Payer: Self-pay | Admitting: Urology

## 2021-11-25 NOTE — Patient Instructions (Signed)
SURGICAL WAITING ROOM VISITATION Patients having surgery or a procedure may have no more than 2 support people in the waiting area - these visitors may rotate.   Children under the age of 71 must have an adult with them who is not the patient. If the patient needs to stay at the hospital during part of their recovery, the visitor guidelines for inpatient rooms apply. Pre-op nurse will coordinate an appropriate time for 1 support person to accompany patient in pre-op.  This support person may not rotate.    Please refer to the [facility] website for the visitor guidelines for Inpatients (after your surgery is over and you are in a regular room).       Your procedure is scheduled on:  12/13/21    Report to [facility] Main Entrance    Report to admitting at   [phone]   Call this number if you have problems the morning of surgery [phone]   Do not eat food :After Midnight.   After Midnight you may have the following liquids until ___0430___ AM  DAY OF SURGERY  Water Non-Citrus Juices (without pulp, NO RED) Carbonated Beverages Black Coffee (NO MILK/CREAM OR CREAMERS, sugar ok)  Clear Tea (NO MILK/CREAM OR CREAMERS, sugar ok) regular and decaf                             Plain Jell-O (NO RED)                                           Fruit ices (not with fruit pulp, NO RED)                                     Popsicles (NO RED)                                                               Sports drinks like Gatorade (NO RED)                       Oral Hygiene is also important to reduce your risk of infection.                                    Remember - BRUSH YOUR TEETH THE MORNING OF SURGERY WITH YOUR REGULAR TOOTHPASTE   Do NOT smoke after Midnight   Take these medicines the morning of surgery with A SIP OF WATER:  nebulizer if neeeded, cymbalta, metoprolol, inhalers as usual and bring, tamsulosin   DO NOT TAKE ANY ORAL DIABETIC MEDICATIONS DAY OF YOUR  SURGERY  Bring CPAP mask and tubing day of surgery.                              You may not have any metal on your body including hair pins, jewelry, and body piercing  Do not wear make-up, lotions, powders, perfumes/cologne, or deodorant  Do not wear nail polish including gel and S&S, artificial/acrylic nails, or any other type of covering on natural nails including finger and toenails. If you have artificial nails, gel coating, etc. that needs to be removed by a nail salon please have this removed prior to surgery or surgery may need to be canceled/ delayed if the surgeon/ anesthesia feels like they are unable to be safely monitored.   Do not shave  48 hours prior to surgery.               Men may shave face and neck.   Do not bring valuables to the hospital. [place].   Contacts, dentures or bridgework may not be worn into surgery.   Bring small overnight bag day of surgery.   DO NOT [place]. PHARMACY WILL DISPENSE MEDICATIONS LISTED ON YOUR MEDICATION LIST TO YOU DURING YOUR ADMISSION [place]!    Patients discharged on the day of surgery will not be allowed to drive home.  Someone NEEDS to stay with you for the first 24 hours after anesthesia.   Special Instructions: Bring a copy of your healthcare power of attorney and living will documents         the day of surgery if you haven't scanned them before.              Please read over the following fact sheets you were given: IF YOU HAVE QUESTIONS ABOUT YOUR PRE-OP INSTRUCTIONS PLEASE CALL [phone]     [facility] - Preparing for Surgery Before surgery, you can play an important role.  Because skin is not sterile, your skin needs to be as free of germs as possible.  You can reduce the number of germs on your skin by washing with CHG (chlorahexidine gluconate) soap before surgery.  CHG is an antiseptic cleaner which kills  germs and bonds with the skin to continue killing germs even after washing. Please DO NOT use if you have an allergy to CHG or antibacterial soaps.  If your skin becomes reddened/irritated stop using the CHG and inform your nurse when you arrive at Short Stay. Do not shave (including legs and underarms) for at least 48 hours prior to the first CHG shower.  You may shave your face/neck. Please follow these instructions carefully:  1.  Shower with CHG Soap the night before surgery and the  morning of Surgery.  2.  If you choose to wash your hair, wash your hair first as usual with your  normal  shampoo.  3.  After you shampoo, rinse your hair and body thoroughly to remove the  shampoo.                           4.  Use CHG as you would any other liquid soap.  You can apply chg directly  to the skin and wash                       Gently with a scrungie or clean washcloth.  5.  Apply the CHG Soap to your body ONLY FROM THE NECK DOWN.   Do not use on face/ open  Wound or open sores. Avoid contact with eyes, ears mouth and genitals (private parts).                       Wash face,  Genitals (private parts) with your normal soap.             6.  Wash thoroughly, paying special attention to the area where your surgery  will be performed.  7.  Thoroughly rinse your body with warm water from the neck down.  8.  DO NOT shower/wash with your normal soap after using and rinsing off  the CHG Soap.                9.  Pat yourself dry with a clean towel.            10.  Wear clean pajamas.            11.  Place clean sheets on your bed the night of your first shower and do not  sleep with pets. Day of Surgery : Do not apply any lotions/deodorants the morning of surgery.  Please wear clean clothes to the hospital/surgery center.  FAILURE TO FOLLOW THESE INSTRUCTIONS MAY RESULT IN THE CANCELLATION OF YOUR SURGERY PATIENT SIGNATURE_________________________________  NURSE  SIGNATURE__________________________________  ________________________________________________________________________

## 2021-11-25 NOTE — Progress Notes (Addendum)
Anesthesia Review:  PCP: Ria Clock  Cardiologist : Gerri Spore [name] - LOV 05/12/21  Pulmonology- Amber Oberle- LOV 09/07/21  Chest x-ray : EKG : 11/30/21  Echo : 06/01/21  Vasc- 10/31/20  Super D chest- 05/26/21  Stress test: Cardiac Cath :  10/30/20  Activity level: uses oxygen can do a flight of stairs  Sleep Study/ CPAP : has cpap  Fasting Blood Sugar :      / Checks Blood Sugar -- times a day:   Blood Thinner/ Instructions /Last Dose: ASA / Instructions/ Last Dose :   Borderline Diabetes per pt - does not check glucose at home  Hgba1c- 11/30/21- 5.7  Medical hx and preop instructions completed via phone.  PT came in and vital signs, consent, glucose , labs and ekg completed.  PT on oxygen 2L/[place] at time of preop.  PT in no resp distress.  O2 sat was 100 on 2L/[place] at preop . PT states when sitting in chair his oxygen is at 2L/[place] with Exertion his oxygen is at 5-6L/[place].  pT ambulates on own with oxygen and with 2 extra bottles of oxygen in tow.  PT states he carries plenty of oxygen with him.

## 2021-11-26 ENCOUNTER — Other Ambulatory Visit: Payer: Self-pay | Admitting: Internal Medicine

## 2021-11-30 ENCOUNTER — Encounter (HOSPITAL_COMMUNITY): Payer: Self-pay

## 2021-11-30 ENCOUNTER — Encounter (HOSPITAL_COMMUNITY)
Admission: RE | Admit: 2021-11-30 | Discharge: 2021-11-30 | Disposition: A | Discharge status: Home / Self Care | Payer: Medicare Other | Source: Ambulatory Visit | Attending: Urology | Admitting: Urology

## 2021-11-30 ENCOUNTER — Other Ambulatory Visit: Payer: Self-pay

## 2021-11-30 VITALS — BP 144/77 | HR 65 | Temp 98.0°F | Resp 16 | Ht 65.5 in | Wt 174.0 lb

## 2021-11-30 DIAGNOSIS — R7303 Prediabetes: Secondary | ICD-10-CM | POA: Insufficient documentation

## 2021-11-30 DIAGNOSIS — Z01818 Encounter for other preprocedural examination: Secondary | ICD-10-CM | POA: Diagnosis present

## 2021-11-30 DIAGNOSIS — I10 Essential (primary) hypertension: Secondary | ICD-10-CM

## 2021-11-30 DIAGNOSIS — J449 Chronic obstructive pulmonary disease, unspecified: Secondary | ICD-10-CM | POA: Diagnosis not present

## 2021-11-30 DIAGNOSIS — I428 Other cardiomyopathies: Secondary | ICD-10-CM | POA: Diagnosis not present

## 2021-11-30 DIAGNOSIS — G4733 Obstructive sleep apnea (adult) (pediatric): Secondary | ICD-10-CM | POA: Insufficient documentation

## 2021-11-30 DIAGNOSIS — J9610 Chronic respiratory failure, unspecified whether with hypoxia or hypercapnia: Secondary | ICD-10-CM | POA: Diagnosis not present

## 2021-11-30 DIAGNOSIS — I110 Hypertensive heart disease with heart failure: Secondary | ICD-10-CM | POA: Diagnosis not present

## 2021-11-30 HISTORY — DX: Prediabetes: R73.03

## 2021-11-30 LAB — CBC
HCT: 37.3 % — ABNORMAL LOW (ref 39.0–52.0)
Hemoglobin: 11.9 g/dL — ABNORMAL LOW (ref 13.0–17.0)
MCH: 30.2 pg (ref 26.0–34.0)
MCHC: 31.9 g/dL (ref 30.0–36.0)
MCV: 94.7 fL (ref 80.0–100.0)
Platelets: 223 10*3/uL (ref 150–400)
RBC: 3.94 MIL/uL — ABNORMAL LOW (ref 4.22–5.81)
RDW: 13.3 % (ref 11.5–15.5)
WBC: 4.2 10*3/uL (ref 4.0–10.5)
nRBC: 0 % (ref 0.0–0.2)

## 2021-11-30 LAB — HEMOGLOBIN A1C
Hgb A1c MFr Bld: 5.7 % — ABNORMAL HIGH (ref 4.8–5.6)
Mean Plasma Glucose: 116.89 mg/dL

## 2021-11-30 LAB — GLUCOSE, CAPILLARY: Glucose-Capillary: 94 mg/dL (ref 70–99)

## 2021-11-30 LAB — BASIC METABOLIC PANEL
Anion gap: 3 — ABNORMAL LOW (ref 5–15)
BUN: 22 mg/dL (ref 8–23)
CO2: 33 mmol/L — ABNORMAL HIGH (ref 22–32)
Calcium: 9.6 mg/dL (ref 8.9–10.3)
Chloride: 106 mmol/L (ref 98–111)
Creatinine, Ser: 1.22 mg/dL (ref 0.61–1.24)
GFR, Estimated: >60 mL/min (ref >60)
Glucose, Bld: 103 mg/dL — ABNORMAL HIGH (ref 70–99)
Potassium: 4.8 mmol/L (ref 3.5–5.1)
Sodium: 142 mmol/L (ref 135–145)

## 2021-12-01 NOTE — Progress Notes (Signed)
Anesthesia Chart Review:   Case: [id number] Date/Time: 12/13/21 0715   Procedure: CYSTOSCOPY WITH INSERTION OF UROLIFT - 30 MINS   Anesthesia type: Choice   Pre-op diagnosis: BENIGN PROSTATIC HYPERPLASIA   Location: [facility] PROC[facility] / [facility]   Surgeons: Marcine Matar, MD       DISCUSSION: Pt is 71 years old with hx HFrEF (was 35%, improved to 45-50% on 06/17/21 echo), NICM, HTN, pre-diabetes, OSA, severe COPD, chronic respiratory failure, uses at least 2L [place] O2 at all times (uses 5-6L for activity). Hx cocaine/crack and tobacco use.   VS: BP (!) 144/77   Pulse 65   Temp 36.7 C (Oral)   Resp 16   Ht 5' 5.5" (1.664 m)   Wt 78.9 kg   SpO2 100%   BMI 28.51 kg/m   PROVIDERS: - Primary care at Center, [facility] - Cardiologist is Lennie Odor, MD. Last office visit 05/12/21 - Pulmonologist is Quentin Angst, MD. Last office visit 09/07/21 (notes in care everywhere)   LABS: Labs reviewed: Acceptable for surgery. (all labs ordered are listed, but only abnormal results are displayed)  Labs Reviewed  HEMOGLOBIN A1C - Abnormal; Notable for the following components:      Result Value   Hgb A1c MFr Bld 5.7 (*)    All other components within normal limits  CBC - Abnormal; Notable for the following components:   RBC 3.94 (*)    Hemoglobin 11.9 (*)    HCT 37.3 (*)    All other components within normal limits  BASIC METABOLIC PANEL - Abnormal; Notable for the following components:   CO2 33 (*)    Glucose, Bld 103 (*)    Anion gap 3 (*)    All other components within normal limits  GLUCOSE, CAPILLARY   OTHER PROCEDURES: PFTs 09/07/21 (care everywhere): - very severe obstructive lung disease on spirometry by ATS criteria. Lung volumes suggesting of moderate air trapping.    IMAGES: CT chest 11/10/21 (care everywhere): - Severe centrilobular and paraseptal emphysema.  - A few punctate sub-4 mm lung nodules are unchanged. No enlarging or otherwise suspicious nodules.      EKG 11/30/21: Sinus rhythm with short PR with PACs   CV: Echo 06/17/21:  1. Left ventricular ejection fraction, by estimation, is 45 to 50%. Left ventricular ejection fraction by 3D volume is 49 %. The left ventricle has mildly decreased function. The left ventricle has no regional wall motion abnormalities. Left ventricular  diastolic parameters are indeterminate.  2. Right ventricular systolic function is normal. The right ventricular size is normal. Tricuspid regurgitation signal is inadequate for assessing PA pressure.  3. The mitral valve is normal in structure. Trivial mitral valve regurgitation. No evidence of mitral stenosis.  4. The aortic valve is normal in structure. Aortic valve regurgitation is not visualized. No aortic stenosis is present.  5. The inferior vena cava is normal in size with greater than 50% respiratory variability, suggesting right atrial pressure of 3 mmHg.   Cardiac cath 10/30/20:  1.  Normal coronary arteries. 2.  Mildly to moderately reduced LV systolic function with an EF of 40% with global hypokinesis. 3.  Right heart catheterization showed moderately elevated filling pressures, moderate pulmonary hypertension and mildly reduced cardiac output.  Interpretation of right heart numbers was somewhat difficult due to severe respiratory variations and patient's difficulty with breath-hold.   Past Medical History:  Diagnosis Date   CHF (congestive heart failure) (HCC)    Emphysema  Erectile dysfunction    Hyperlipemia    Hypertension    Polysubstance abuse (HCC)    stopped using crack/cocaine December 4th 20-14   Pre-diabetes    Sleep apnea     Past Surgical History:  Procedure Laterality Date   CARDIAC CATHETERIZATION     CYSTOSCOPY WITH INSERTION OF UROLIFT N/A 08/03/2020   Procedure: CYSTOSCOPY WITH INSERTION OF UROLIFT;  Surgeon: Marcine Matar, MD;  Location: [facility];  Service: Urology;  Laterality: N/A;  30 MINS   LEFT HEART CATHETERIZATION  WITH CORONARY ANGIOGRAM N/A 07/15/2014   Procedure: LEFT HEART CATHETERIZATION WITH CORONARY ANGIOGRAM;  Surgeon: Lennette Bihari, MD;  Location: [facility] CATH LAB;  Service: Cardiovascular;  Laterality: N/A;   RIGHT/LEFT HEART CATH AND CORONARY ANGIOGRAPHY N/A 10/30/2020   Procedure: RIGHT/LEFT HEART CATH AND CORONARY ANGIOGRAPHY;  Surgeon: Iran Ouch, MD;  Location: [facility] INVASIVE CV LAB;  Service: Cardiovascular;  Laterality: N/A;    MEDICATIONS:  acetaminophen (TYLENOL) 500 MG tablet   albuterol (PROVENTIL) (2.5 MG/3ML) 0.083% nebulizer solution   atorvastatin (LIPITOR) 80 MG tablet   diclofenac Sodium (VOLTAREN) 1 % GEL   DULoxetine (CYMBALTA) 60 MG capsule   fluticasone (FLONASE) 50 MCG/ACT nasal spray   furosemide (LASIX) 20 MG tablet   glucose blood (COOL BLOOD GLUCOSE TEST STRIPS) test strip   guaiFENesin (MUCINEX) 600 MG 12 hr tablet   ipratropium (ATROVENT) 0.03 % nasal spray   Ipratropium-Albuterol (COMBIVENT RESPIMAT) 20-100 MCG/ACT AERS respimat   Lancets MISC   losartan (COZAAR) 50 MG tablet   metoprolol succinate (TOPROL-XL) 50 MG 24 hr tablet   mirtazapine (REMERON) 30 MG tablet   oxybutynin (DITROPAN) 5 MG tablet   OXYGEN   SYMBICORT 160-4.5 MCG/ACT inhaler   tamsulosin (FLOMAX) 0.4 MG CAPS capsule   No current facility-administered medications for this encounter.    If no changes, I anticipate pt can proceed with surgery as scheduled.   Rica Mast, PhD, FNP-BC [facility] Short Stay Surgical [facility]/Anesthesiology Phone: [phone] 12/01/2021 12:56 PM

## 2021-12-01 NOTE — Anesthesia Preprocedure Evaluation (Deleted)
Anesthesia Evaluation    Airway        Dental   Pulmonary former smoker,           Cardiovascular hypertension,      Neuro/Psych    GI/Hepatic   Endo/Other    Renal/GU      Musculoskeletal   Abdominal   Peds  Hematology   Anesthesia Other Findings   Reproductive/Obstetrics                                                              Anesthesia Evaluation  Patient identified by MRN, date of birth, ID band Patient awake    Reviewed: Allergy & Precautions, NPO status , Patient's Chart, lab work & pertinent test results  Airway Mallampati: II  TM Distance: >3 FB Neck ROM: Full    Dental  (+) Partial Lower   Pulmonary sleep apnea , COPD,  COPD inhaler, former smoker,    Pulmonary exam normal        Cardiovascular hypertension, Pt. on medications  Rhythm:Regular Rate:Normal     Neuro/Psych  Headaches, Anxiety    GI/Hepatic negative GI ROS, Neg liver ROS,   Endo/Other  negative endocrine ROS  Renal/GU negative Renal ROS   BPH    Musculoskeletal negative musculoskeletal ROS (+)   Abdominal (+)  Abdomen: soft. Bowel sounds: normal.  Peds  Hematology negative hematology ROS (+)   Anesthesia Other Findings   Reproductive/Obstetrics                            Anesthesia Physical Anesthesia Plan  ASA: II  Anesthesia Plan: General   Post-op Pain Management:    Induction: Intravenous  PONV Risk Score and Plan: 2 and Ondansetron, Dexamethasone and Treatment may vary due to age or medical condition  Airway Management Planned: Mask and LMA  Additional Equipment: None  Intra-op Plan:   Post-operative Plan: Extubation in OR  Informed Consent: I have reviewed the patients History and Physical, chart, labs and discussed the procedure including the risks, benefits and alternatives for the proposed anesthesia with the patient or  authorized representative who has indicated his/her understanding and acceptance.     Dental advisory given  Plan Discussed with: CRNA  Anesthesia Plan Comments: (Lab Results      Component                Value               Date                      WBC                      4.5                 07/27/2020                HGB                      11.9 (L)            07/27/2020                HCT  36.6 (L)            07/27/2020                MCV                      93.1                07/27/2020                PLT                      210                 07/27/2020           Lab Results      Component                Value               Date                      NA                       140                 07/27/2020                K                        4.0                 07/27/2020                CO2                      28                  07/27/2020                GLUCOSE                  117 (H)             07/27/2020                BUN                      18                  07/27/2020                CREATININE               1.12                07/27/2020                CALCIUM                  8.9                 07/27/2020                GFRNONAA                 >60                 07/27/2020  GFRAA                    >60                 05/06/2019          )       Anesthesia Quick Evaluation  Anesthesia Physical Anesthesia Plan  ASA:   Anesthesia Plan:    Post-op Pain Management:    Induction:   PONV Risk Score and Plan:   Airway Management Planned:   Additional Equipment:   Intra-op Plan:   Post-operative Plan:   Informed Consent:   Plan Discussed with:   Anesthesia Plan Comments: (See APP note by Durel Salts, FNP )        Anesthesia Quick Evaluation

## 2021-12-12 NOTE — Anesthesia Preprocedure Evaluation (Signed)
Anesthesia Evaluation  Patient identified by MRN, date of birth, ID band Patient awake    Reviewed: Allergy & Precautions, NPO status , Patient's Chart, lab work & pertinent test results  History of Anesthesia Complications Negative for: history of anesthetic complications  Airway Mallampati: I  TM Distance: >3 FB Neck ROM: Full    Dental  (+) Partial Lower, Edentulous Upper, Dental Advisory Given   Pulmonary sleep apnea , COPD,  COPD inhaler, former smoker,    Pulmonary exam normal        Cardiovascular hypertension, Pt. on medications and Pt. on home beta blockers Normal cardiovascular exam  IMPRESSIONS Left ventricular ejection fraction, by estimation, is 45 to 50%. Left ventricular ejection fraction by 3D volume is 49 %. The left ventricle has mildly decreased function. The left ventricle has no regional wall motion abnormalities. Left ventricular diastolic parameters are indeterminate. 1. Right ventricular systolic function is normal. The right ventricular size is normal. Tricuspid regurgitation signal is inadequate for assessing PA pressure. 2. The mitral valve is normal in structure. Trivial mitral valve regurgitation. No evidence of mitral stenosis. 3. The aortic valve is normal in structure. Aortic valve regurgitation is not visualized. No aortic stenosis is present. 4. The inferior vena cava is normal in size with greater than 50% respiratory variability, suggesting right atrial pressure of 3 mmHg.  Cath  ? There is mild to moderate left ventricular systolic dysfunction. ? The left ventricular ejection fraction is 45-50% by visual estimate. ? LV end diastolic pressure is moderately elevated.   1.  Normal coronary arteries. 2.  Mildly to moderately reduced LV systolic function with an EF of 40% with global hypokinesis. 3.  Right heart catheterization showed moderately elevated filling pressures, moderate  pulmonary hypertension and mildly reduced cardiac output.  Interpretation of right heart numbers was somewhat difficult due to severe respiratory variations and patient's difficulty with breath-hold.  Recommendations: The patient has nonischemic cardiomyopathy and he appears to be volume overloaded. Recommend medical therapy for heart failure and cardiomyopathy. I added furosemide 20 mg intravenously twice daily for volume overload. Consider switching lisinopril to Entresto in the near future and adding spironolactone. Discontinue prazosin if the only indication for use is hypertension.      Neuro/Psych  Headaches, Anxiety    GI/Hepatic negative GI ROS, Neg liver ROS,   Endo/Other  negative endocrine ROS  Renal/GU negative Renal ROS   BPH    Musculoskeletal negative musculoskeletal ROS (+)   Abdominal   Peds  Hematology negative hematology ROS (+)   Anesthesia Other Findings   Reproductive/Obstetrics                            Anesthesia Physical  Anesthesia Plan  ASA: 4  Anesthesia Plan: General   Post-op Pain Management: Celebrex PO (pre-op)* and Tylenol PO (pre-op)*   Induction: Intravenous  PONV Risk Score and Plan: 2 and Ondansetron, Dexamethasone and Treatment may vary due to age or medical condition  Airway Management Planned: LMA  Additional Equipment: None  Intra-op Plan:   Post-operative Plan: Extubation in OR  Informed Consent: I have reviewed the patients History and Physical, chart, labs and discussed the procedure including the risks, benefits and alternatives for the proposed anesthesia with the patient or authorized representative who has indicated his/her understanding and acceptance.     Dental advisory given  Plan Discussed with: Anesthesiologist and CRNA  Anesthesia Plan Comments:  Anesthesia Quick Evaluation  

## 2021-12-13 ENCOUNTER — Encounter (HOSPITAL_COMMUNITY): Payer: Self-pay | Admitting: Urology

## 2021-12-13 ENCOUNTER — Ambulatory Visit (HOSPITAL_BASED_OUTPATIENT_CLINIC_OR_DEPARTMENT_OTHER): Payer: Medicare Other | Admitting: Anesthesiology

## 2021-12-13 ENCOUNTER — Ambulatory Visit (HOSPITAL_COMMUNITY)
Admission: RE | Admit: 2021-12-13 | Discharge: 2021-12-13 | Disposition: A | Discharge status: Home / Self Care | Payer: Medicare Other | Source: Ambulatory Visit | Attending: Urology | Admitting: Urology

## 2021-12-13 ENCOUNTER — Ambulatory Visit (HOSPITAL_COMMUNITY): Payer: Medicare Other | Admitting: Emergency Medicine

## 2021-12-13 ENCOUNTER — Encounter (HOSPITAL_COMMUNITY)
Admission: RE | Disposition: A | Discharge status: Home / Self Care | Payer: Self-pay | Source: Ambulatory Visit | Attending: Urology

## 2021-12-13 DIAGNOSIS — J449 Chronic obstructive pulmonary disease, unspecified: Secondary | ICD-10-CM | POA: Diagnosis not present

## 2021-12-13 DIAGNOSIS — N401 Enlarged prostate with lower urinary tract symptoms: Secondary | ICD-10-CM

## 2021-12-13 DIAGNOSIS — I10 Essential (primary) hypertension: Secondary | ICD-10-CM

## 2021-12-13 DIAGNOSIS — I110 Hypertensive heart disease with heart failure: Secondary | ICD-10-CM | POA: Diagnosis not present

## 2021-12-13 DIAGNOSIS — I509 Heart failure, unspecified: Secondary | ICD-10-CM | POA: Diagnosis not present

## 2021-12-13 DIAGNOSIS — Z87891 Personal history of nicotine dependence: Secondary | ICD-10-CM

## 2021-12-13 DIAGNOSIS — R338 Other retention of urine: Secondary | ICD-10-CM | POA: Diagnosis not present

## 2021-12-13 HISTORY — PX: CYSTOSCOPY WITH INSERTION OF UROLIFT: SHX6678

## 2021-12-13 SURGERY — CYSTOSCOPY WITH INSERTION OF UROLIFT
Anesthesia: General

## 2021-12-13 MED ORDER — DEXAMETHASONE SODIUM PHOSPHATE 10 MG/ML IJ SOLN
INTRAMUSCULAR | Status: DC | PRN
  Administered 2021-12-13: 10 mg via INTRAVENOUS

## 2021-12-13 MED ORDER — DEXAMETHASONE SODIUM PHOSPHATE 10 MG/ML IJ SOLN
INTRAMUSCULAR | Status: AC
  Filled 2021-12-13: qty 1

## 2021-12-13 MED ORDER — LIDOCAINE 2% (20 MG/ML) 5 ML SYRINGE
INTRAMUSCULAR | Status: AC
  Filled 2021-12-13: qty 5

## 2021-12-13 MED ORDER — PROMETHAZINE HCL 25 MG/ML IJ SOLN: 6.2500 mg | INTRAMUSCULAR | Status: DC | PRN

## 2021-12-13 MED ORDER — PROPOFOL 10 MG/ML IV BOLUS
INTRAVENOUS | Status: DC | PRN
  Administered 2021-12-13: 80 mg via INTRAVENOUS

## 2021-12-13 MED ORDER — CEFAZOLIN SODIUM-DEXTROSE 2-4 GM/100ML-% IV SOLN
2.0000 g | INTRAVENOUS | Status: AC
  Administered 2021-12-13: 2 g via INTRAVENOUS
  Filled 2021-12-13: qty 100

## 2021-12-13 MED ORDER — LIDOCAINE 2% (20 MG/ML) 5 ML SYRINGE
INTRAMUSCULAR | Status: DC | PRN
  Administered 2021-12-13: 60 mg via INTRAVENOUS

## 2021-12-13 MED ORDER — FENTANYL CITRATE (PF) 100 MCG/2ML IJ SOLN
INTRAMUSCULAR | Status: AC
  Filled 2021-12-13: qty 2

## 2021-12-13 MED ORDER — ACETAMINOPHEN 500 MG PO TABS
1000.0000 mg | ORAL_TABLET | Freq: Once | ORAL | Status: AC
  Administered 2021-12-13: 1000 mg via ORAL
  Filled 2021-12-13: qty 2

## 2021-12-13 MED ORDER — FENTANYL CITRATE (PF) 100 MCG/2ML IJ SOLN
INTRAMUSCULAR | Status: DC | PRN
  Administered 2021-12-13: 50 ug via INTRAVENOUS

## 2021-12-13 MED ORDER — ONDANSETRON HCL 4 MG/2ML IJ SOLN
INTRAMUSCULAR | Status: AC
  Filled 2021-12-13: qty 2

## 2021-12-13 MED ORDER — LACTATED RINGERS IV SOLN
INTRAVENOUS | Status: DC
Start: 2021-12-13 — End: 2021-12-13

## 2021-12-13 MED ORDER — CHLORHEXIDINE GLUCONATE 0.12 % MT SOLN
15.0000 mL | Freq: Once | OROMUCOSAL | Status: AC
Start: 2021-12-13 — End: 2021-12-13
  Administered 2021-12-13: 15 mL via OROMUCOSAL

## 2021-12-13 MED ORDER — AMISULPRIDE (ANTIEMETIC) 5 MG/2ML IV SOLN: 10.0000 mg | Freq: Once | INTRAVENOUS | Status: DC | PRN

## 2021-12-13 MED ORDER — FENTANYL CITRATE PF 50 MCG/ML IJ SOSY
25.0000 ug | PREFILLED_SYRINGE | INTRAMUSCULAR | Status: DC | PRN
  Administered 2021-12-13 (×2): 50 ug via INTRAVENOUS

## 2021-12-13 MED ORDER — MIDAZOLAM HCL 2 MG/2ML IJ SOLN
INTRAMUSCULAR | Status: AC
  Filled 2021-12-13: qty 2

## 2021-12-13 MED ORDER — PROPOFOL 10 MG/ML IV BOLUS
INTRAVENOUS | Status: AC
  Filled 2021-12-13: qty 20

## 2021-12-13 MED ORDER — ORAL CARE MOUTH RINSE
15.0000 mL | Freq: Once | OROMUCOSAL | Status: AC
Start: 2021-12-13 — End: 2021-12-13

## 2021-12-13 MED ORDER — FENTANYL CITRATE PF 50 MCG/ML IJ SOSY
PREFILLED_SYRINGE | INTRAMUSCULAR | Status: AC
  Filled 2021-12-13: qty 2

## 2021-12-13 MED ORDER — STERILE WATER FOR IRRIGATION IR SOLN
Status: DC | PRN
  Administered 2021-12-13: 3000 mL

## 2021-12-13 MED ORDER — ONDANSETRON HCL 4 MG/2ML IJ SOLN
INTRAMUSCULAR | Status: DC | PRN
  Administered 2021-12-13: 4 mg via INTRAVENOUS

## 2021-12-13 MED ORDER — CELECOXIB 200 MG PO CAPS
200.0000 mg | ORAL_CAPSULE | Freq: Once | ORAL | Status: AC
  Administered 2021-12-13: 200 mg via ORAL
  Filled 2021-12-13: qty 1

## 2021-12-13 MED ORDER — MIDAZOLAM HCL 5 MG/5ML IJ SOLN
INTRAMUSCULAR | Status: DC | PRN
  Administered 2021-12-13: 1 mg via INTRAVENOUS

## 2021-12-13 SURGICAL SUPPLY — 14 items
BAG COUNTER SPONGE SURGICOUNT (BAG) IMPLANT
BAG URINE DRAIN 2000ML AR STRL (UROLOGICAL SUPPLIES) IMPLANT
BAG URO CATCHER STRL LF (MISCELLANEOUS) ×1 IMPLANT
CATH FOLEY 2WAY SLVR  5CC 18FR (CATHETERS) ×1
CATH FOLEY 2WAY SLVR 5CC 18FR (CATHETERS) IMPLANT
GLOVE SURG LX 8.0 MICRO (GLOVE) ×1
GLOVE SURG LX STRL 8.0 MICRO (GLOVE) ×1 IMPLANT
GOWN STRL REUS W/ TWL XL LVL3 (GOWN DISPOSABLE) ×2 IMPLANT
GOWN STRL REUS W/TWL XL LVL3 (GOWN DISPOSABLE) ×2
MANIFOLD NEPTUNE II (INSTRUMENTS) ×1 IMPLANT
PACK CYSTO (CUSTOM PROCEDURE TRAY) ×1 IMPLANT
SYSTEM UROLIFT (Male Continence) IMPLANT
TUBING CONNECTING 10 (TUBING) ×1 IMPLANT
WATER STERILE IRR 1000ML POUR (IV SOLUTION) IMPLANT

## 2021-12-13 NOTE — Transfer of Care (Signed)
Immediate Anesthesia Transfer of Care Note  Patient: Gerald Williams  Procedure(s) Performed: CYSTOSCOPY WITH INSERTION OF UROLIFT  Patient Location: PACU  Anesthesia Type:General  Level of Consciousness: awake, alert  and oriented  Airway & Oxygen Therapy: Patient Spontanous Breathing and Patient connected to face mask oxygen  Post-op Assessment: Report given to RN and Post -op Vital signs reviewed and stable  Post vital signs: Reviewed and stable  Last Vitals:  Vitals Value Taken Time  BP 175/101 12/13/21 0806  Temp    Pulse 63 12/13/21 0807  Resp 19 12/13/21 0807  SpO2 100 % 12/13/21 0807  Vitals shown include unvalidated device data.  Last Pain:  Vitals:   12/13/21 0539  TempSrc:   PainSc: 0-No pain         Complications: No notable events documented.

## 2021-12-13 NOTE — Discharge Instructions (Signed)

## 2021-12-13 NOTE — Anesthesia Postprocedure Evaluation (Signed)
Anesthesia Post Note  Patient: Gerald Williams  Procedure(s) Performed: CYSTOSCOPY WITH INSERTION OF UROLIFT     Patient location during evaluation: PACU Anesthesia Type: General Level of consciousness: sedated Pain management: pain level controlled Vital Signs Assessment: post-procedure vital signs reviewed and stable Respiratory status: spontaneous breathing and respiratory function stable Cardiovascular status: stable Postop Assessment: no apparent nausea or vomiting Anesthetic complications: no   No notable events documented.  Last Vitals:  Vitals:   12/13/21 0830 12/13/21 0845  BP: (!) 162/108 (!) 158/98  Pulse: 65 65  Resp: 10 20  Temp:  (!) 36.3 C  SpO2: 96% 97%    Last Pain:  Vitals:   12/13/21 0845  TempSrc:   PainSc: 0-No pain                 Amra Shukla DANIEL

## 2021-12-13 NOTE — Anesthesia Procedure Notes (Signed)
Procedure Name: LMA Insertion Date/Time: 12/13/2021 7:37 AM  Performed by: Florene Route, CRNAPatient Re-evaluated:Patient Re-evaluated prior to induction Oxygen Delivery Method: Circle system utilized Preoxygenation: Pre-oxygenation with 100% oxygen Induction Type: IV induction Ventilation: Mask ventilation without difficulty LMA: LMA inserted LMA Size: 4.0 Number of attempts: 1 Placement Confirmation: positive ETCO2 and breath sounds checked- equal and bilateral Tube secured with: Tape Dental Injury: Teeth and Oropharynx as per pre-operative assessment

## 2021-12-13 NOTE — Addendum Note (Signed)
Addendum  created 12/13/21 1021 by Florene Route, CRNA   Charge Capture section accepted

## 2021-12-13 NOTE — H&P (Signed)
H&P  Chief Complaint: Urinary retention   History of Present Illness: 71 yo male w/ significant COPD presents for repeat Urolift (original one in April 2022) for persistent retentiom  Past Medical History:  Diagnosis Date   CHF (congestive heart failure) (HCC)    Emphysema    Erectile dysfunction    Hyperlipemia    Hypertension    Polysubstance abuse (HCC)    stopped using crack/cocaine December 4th 20-14   Pre-diabetes    Sleep apnea     Past Surgical History:  Procedure Laterality Date   CARDIAC CATHETERIZATION     CYSTOSCOPY WITH INSERTION OF UROLIFT N/A 08/03/2020   Procedure: CYSTOSCOPY WITH INSERTION OF UROLIFT;  Surgeon: Marcine Matar, MD;  Location: [facility];  Service: Urology;  Laterality: N/A;  30 MINS   LEFT HEART CATHETERIZATION WITH CORONARY ANGIOGRAM N/A 07/15/2014   Procedure: LEFT HEART CATHETERIZATION WITH CORONARY ANGIOGRAM;  Surgeon: Lennette Bihari, MD;  Location: [facility] CATH LAB;  Service: Cardiovascular;  Laterality: N/A;   RIGHT/LEFT HEART CATH AND CORONARY ANGIOGRAPHY N/A 10/30/2020   Procedure: RIGHT/LEFT HEART CATH AND CORONARY ANGIOGRAPHY;  Surgeon: Iran Ouch, MD;  Location: [facility] INVASIVE CV LAB;  Service: Cardiovascular;  Laterality: N/A;    Home Medications:    Allergies:  Allergies  Allergen Reactions   Tiotropium Cough    Other reaction(s): Dysphagia,   Gabapentin     Other reaction(s): Dizziness   Nortriptyline     Other reaction(s): Drowsy   Budeson-Glycopyrrol-Formoterol Other (See Comments)    Other reaction(s): Retention of urine, Constipation, Retention of urine, Constipation   Olodaterol Hives and Rash   Oxcarbazepine Nausea And Vomiting   Tiotropium Bromide-Olodaterol Itching    Family History  Problem Relation Age of Onset   Diabetes Mother        Deceased   Heart disease Father        Deceased   Heart attack Father        x5   Cancer - Other Sister    Healthy Son    Healthy Daughter    Heart attack Brother         Deceased   Alcohol abuse Brother        Deceased    Social History:  reports that he quit smoking about 8 years ago. His smoking use included cigarettes. He started smoking about 58 years ago. He has a 75.00 pack-year smoking history. He has never used smokeless tobacco. He reports that he does not currently use alcohol. He reports that he does not use drugs.  ROS: A complete review of systems was performed.  All systems are negative except for pertinent findings as noted.  Physical Exam:  Vital signs in last 24 hours: BP (!) 142/97   Pulse 68   Temp 97.9 F (36.6 C) (Oral)   Resp 18   Ht 5' 5.5" (1.664 m)   Wt 78.9 kg   SpO2 100%   BMI 28.51 kg/m  Constitutional:  Alert and oriented, No acute distress Cardiovascular: Regular rate  Respiratory: Normal respiratory effort GI: Abdomen is soft, nontender, nondistended, no abdominal masses. No CVAT.  Genitourinary: Normal male phallus, testes are descended bilaterally and non-tender and without masses, scrotum is normal in appearance without lesions or masses, perineum is normal on inspection. Lymphatic: No lymphadenopathy Neurologic: Grossly intact, no focal deficits Psychiatric: Normal mood and affect  I have reviewed prior pt notes  I have reviewed notes from referring/previous physicians  I have reviewed urinalysis results  I have independently reviewed prior imaging  I have reviewed prior PSA results  I have reviewed prior urine culture   Impression/Assessment:  BPH w/ retention  Plan:  Repeat Urolift

## 2021-12-13 NOTE — Op Note (Signed)
Preoperative diagnosis: BPH with retention  Postoperative diagnosis: Same  Principal procedure: Cystoscopy, UroLift, placement of 4 implants  Surgeon: Earon Rivest  Anesthesia: General with LMA  Complications: None  Drains: None  Estimated blood loss: Less than 5 mL  Indications: 71 year old male with BPH.  He has had persistent retention despite UroLift procedure in April, 2022.  Recent urodynamics revealed adequate bladder contractility and evidence of retention.  He presents at this time for repeat UroLift procedure.  I have discussed the procedure, risk, complications and expected outcomes with the patient.  He understands and desires to proceed.  Findings: Urethra was normal.  There was still a moderate degree of coapting lateral lobes of the prostate, especially in the bladder neck area despite prior UroLift.  There was a somewhat high riding bladder neck.  Urothelium of the bladder was normal.  There were moderate trabeculations.  No other bladder abnormalities or foreign bodies were noted.  Description of procedure: The patient was properly identified in the holding area.  Is taken to the operating room where general anesthetic was administered with the LMA.  He is placed in the dorsolithotomy position.  Genitalia and perineum were prepped, draped, proper timeout performed.  17 French cystoscope was passed under direct vision through the urethra and into the bladder.  Above-mentioned findings were noted.  First 2 UroLift's were placed in the proximal urethra, approximately 1.5 cm distal to the bladder neck.  These were aimed anterior laterally, placed in the anterior third of the urethra.  1 on the right, 1 on the left.  At this point, after placement, there was excellent opening of the bladder neck.  There was still a very small degree of coapting of the lateral lobes in the mid prostatic urethra.  Another 2 implants were placed in the mid urethra, again with the gun fired and the  anterior third of the urethra, with the needle aimed in an anterior lateral manner.  Following placement of these bilateral paired implants, there was an excellent anterior and mid urethral channel with no evidence of obstruction.  Scope was backed out following inspection of the bladder.  18 French Foley catheter was placed, the procedure was terminated.  The patient was awakened and taken the PACU in stable condition.

## 2021-12-14 ENCOUNTER — Encounter (HOSPITAL_COMMUNITY): Payer: Self-pay | Admitting: Urology

## 2021-12-29 ENCOUNTER — Other Ambulatory Visit: Payer: Self-pay | Admitting: Internal Medicine

## 2022-01-12 ENCOUNTER — Encounter: Payer: Self-pay | Admitting: Internal Medicine

## 2022-01-12 ENCOUNTER — Ambulatory Visit: Payer: Medicare Other | Admitting: Internal Medicine

## 2022-01-12 DIAGNOSIS — J9611 Chronic respiratory failure with hypoxia: Secondary | ICD-10-CM

## 2022-01-12 DIAGNOSIS — J449 Chronic obstructive pulmonary disease, unspecified: Secondary | ICD-10-CM

## 2022-01-12 MED ORDER — BREZTRI AEROSPHERE 160-9-4.8 MCG/ACT IN AERO: 2.0000 | INHALATION_SPRAY | Freq: Two times a day (BID) | RESPIRATORY_TRACT | 0 refills | Status: DC

## 2022-01-12 NOTE — Progress Notes (Signed)
Gerald Williams, male    DOB: [birth year]    MRN: [id number]   Brief patient profile:  71 yobm quit smoking 08/2013 (pt says 2010)  GOLD III/ group D symptoms  with  better on trelegy vs symbicort with less need for saba but concerned about cost of non-VA meds.      History of Present Illness  11/19/2018  Pulmonary/ 1st office eval/Gerald Williams on ACEi and ARB Chief Complaint  Patient presents with   Follow-up    Breathing is overall doing well. He is using his albuterol inhaler 2-3 x per day and has not used his neb.   Dyspnea:  Can walk a half a block on 3lpm but does not check sats walking Cough: none  Sleep: sleeps on cpap/ 3lpm  SABA use: while on trelegy not needing much albuterol  rec It would be preferable if you would take higher doses of diovan and stop lisinopril and the latter causes confusion regarding interpretation of symptoms that look like copd in many cause  The alternative to trelegy would be restarting symbicort using spiriva in combination free thru the [place]   03/11/2019  f/u ov/Gerald Williams re:  GOLD III spirometry / 02 dep  Very poor hfa maint symb 160 2bid /spiriva dpi per [facility]  Chief Complaint  Patient presents with   Follow-up    Patient comes in today for increased shortness of breath with exertion. Patient is normally on 3L and has been turning it up to 4-5L when moving around because he is so short of breath. Patient wants to switch home health care companies and wants a home concentrator.  Dyspnea:  hc parking into store = MMR[facility]3 = can't walk 100 yards even at a slow pace at a flat grade s stopping due to sob   Cough: daytime minimal mucus, sporadic  Sleeping: fine 3 pillows and cpoap SABA use:   02: 3lpm 95% when check  - turns it up to 4  Lpmwalking with sats   95%  - sleeping cpap / 3lpm Rec Plan A = Automatic = Always=   Symbicort 160 Take 2 puffs first thing in am and then another 2 puffs about 12 hours later and spiriva each am  Plan B = Backup (to supplement plan A,  not to replace it) Only use your albuterol inhaler or combivent  as a rescue medication  Plan C = Crisis (instead of Plan B but only if Plan B stops working) - only use your albuterol nebulizer if you first try Plan B and it fails to help > ok to use the nebulizer up to every 4 hours but if start needing it regularly call for immediate appointment   Admit date: 10/29/2020 Discharge date: 11/02/2020  Discharge Diagnoses:  Active Problems:   Chest pain   Angina at rest [facility])   Acute systolic heart failure (HCC)   COPD    Chronic respiratory failure on 4-5 L home O2     History of present illness:  69/M with history of advanced COPD on 5 L home O2, hypertension, dyslipidemia presented to the ED with 2-day history of left-sided chest pain.   Hospital Course:    Atypical chest pain -Ruled out for ACS, chest pain has resolved -EKG nonischemic, high-sensitivity troponins are negative -Left heart cath with normal coronaries   Nonischemic cardiomyopathy Acute systolic CHF -ECHO noted EF of 35%, cath noted normal coronaries, filling pressures were high on cath, that evening 7/8 -received IV Lasix, then became  hypotensive -Cardiology following, gradually uptitrating meds due to hypotension on 7/8 -toprol resumed and started on low dose losartan -Clinically felt to be euvolemic and hence not felt to require standing dose of diuretics at the time of discharge -added low dose lasix 20mg  PRN -Follow-up with [facility]   Hypotension -Overnight following cath on 7/8 evening -I suspect this is secondary to medications, (got Lasix, prazosin, lisinopril and beta-blocker) and sedation.  BP improved now -Clinically do not suspect acute PE -Monitor clinically, antihypertensives held -Restarted Toprol and added low-dose losartan -Blood pressure stable now   AKI -due to hypotension, held lisinopril -Improving, creatinine 1.4   COPD/chronic respiratory failure On 5 L home O2 at  baseline -Quit smoking 10 years ago, stable, continue Combivent and Symb    11/20/2020  post hosp f/u ov/Gerald Williams re: GOLD  3  maint on combivent thru [place] and 02 4-8 lpm  Chief Complaint  Patient presents with   Acute Visit    Patient reports that he had procedure for his heart and feels that he is having trouble catching his breath x 2 weeks.   Dyspnea:  2-3 aisles at [facility] and has to stop on 5 lpm with sats down to 70s now reported  Cough: none  Sleeping: bed is flat, sleeps on back with 2 pillows and [facility]  SABA use: combivent bid  02: 4-5 lpm  Covid status:   x 3 vax  Went back to [facility] for what he describes as new onset  L ant peuritic, localized cp that started 2 d p d/c when he had [facility]  7/8 with wedge of 28 >  advil may help some and assoc worse sob since left hosp> returned to  ER dx as "copd exacerbation with chest tightness"  says no better since rx and insists pain in brand new and well localized and only present when tries to breath deeply Rec Go to [facility] ER further eval of L sided chest pain when you take a deep breath> CTa No definite evidence of pulmonary embolus. Focal ill-defined opacity measuring 12 x 7 mm is noted in the periphery of the left upper lobe. While this may represent focal inflammation or atelectasis, malignancy cannot be excluded.      01/06/2021  f/u ov/Gerald Williams re: GOLD 3 copd  maint on symbicort 160 2bid / combivent   Chief Complaint  Patient presents with   Follow-up    Patient needs a refill of his combivent and has runny nose.    Dyspnea:  sev aisles at HT  5lpm  Cough: none Sleeping: cpap/ 6lpm concentrator 2 pillows flat bed SABA use: combivent 4 x daily  02: 5lplm - 6lpm  Covid status:   vax x 3  Cxr  correlated with area of pain L ant chest and rapidly resolve on abx  Rec Plan A = Automatic = Always=    Stiolto 2 puffs first thing in am  Plan B = Backup (to supplement plan A, not to replace it) Only use your albuterol inhaler as a rescue  medication  Plan C = Crisis (instead of Plan B but only if Plan B stops working) - only use your albuterol nebulizer if you first try Plan B     01/12/2022  f/u ov/Gerald Williams re: GOLD 3 copd    maint on combient symbicort and duoneb / very confused with details of care. Chief Complaint  Patient presents with   Follow-up    Sob-gradually worse, not  wearing CPAP, cough-white, occass. wheezing   Dyspnea:  slow pace x 100 ft  = MMRC3 = can't walk 100 yards even at a slow pace at a flat grade s stopping due to sob  on 5lpm does not check sats  Cough: no  Sleeping: bed is flat/ can't tol cpap so wakes up and puts on 5lpm > has sleep doctor  SABA use: combivent and symbicort /duoneb  02: 5lpm not titrating  Covid status:   vax up to date    No obvious day to day or daytime variability or assoc excess/ purulent sputum or mucus plugs or hemoptysis or cp or chest tightness, subjective wheeze or overt  hb symptoms.   Sleeping  without nocturnal  or early am exacerbation  of respiratory  c/o's or need for noct saba. Also denies any obvious fluctuation of symptoms with weather or environmental changes or other aggravating or alleviating factors except as outlined above   No unusual exposure hx or h/o childhood pna/ asthma or knowledge of premature birth.  Current Allergies, Complete Past Medical History, Past Surgical History, Family History, and Social History were reviewed in [employer] record.  ROS  The following are not active complaints unless bolded Hoarseness, sore throat, dysphagia, dental problems, itching, sneezing,  nasal congestion or discharge of excess mucus or purulent secretions, ear ache,   fever, chills, sweats, unintended wt loss or wt gain, classically pleuritic or exertional cp,  orthopnea pnd or arm/hand swelling  or leg swelling, presyncope, palpitations, abdominal pain, anorexia, nausea, vomiting, diarrhea  or change in bowel habits or change in bladder habits,  change in stools or change in urine, dysuria, hematuria,  rash, arthralgias, visual complaints, headache, numbness, weakness or ataxia or problems with walking or coordination,  change in mood or  memory.        Current Meds  Medication Sig   acetaminophen (TYLENOL) 500 MG tablet Take 1,000 mg by mouth every 6 (six) hours as needed for mild pain, fever or headache.   albuterol (PROVENTIL) (2.5 MG/3ML) 0.083% nebulizer solution Take 2.5 mg by nebulization every 6 (six) hours as needed for wheezing or shortness of breath.   atorvastatin (LIPITOR) 80 MG tablet Take 80 mg by mouth daily.   diclofenac Sodium (VOLTAREN) 1 % GEL Apply 2 g topically 4 (four) times daily.   DULoxetine (CYMBALTA) 60 MG capsule TAKE ONE CAPSULE BY MOUTH TWO TIMES A DAY FOR POSTTRAUMATIC STRESS DISORDER AND DEPRESSION   fluticasone (FLONASE) 50 MCG/ACT nasal spray Place 2 sprays into both nostrils daily as needed for allergies.   furosemide (LASIX) 20 MG tablet Take 1 tablet (20 mg total) by mouth daily.   glucose blood (COOL BLOOD GLUCOSE TEST STRIPS) test strip Used to test blood sugar x2 daily---diagnosis code r73.03--for one touch verio flex   guaiFENesin (MUCINEX) 600 MG 12 hr tablet Take 600 mg by mouth 2 (two) times daily as needed for to loosen phlegm.   ipratropium (ATROVENT) 0.03 % nasal spray Place 1 spray into both nostrils every 8 (eight) hours as needed for rhinitis.   Ipratropium-Albuterol (COMBIVENT RESPIMAT) 20-100 MCG/ACT AERS respimat INHALE 1 PUFF BY MOUTH EVERY 6 HOURS   Lancets MISC Used to test blood sugar x2 daily---diagnosis code r73.03--for one touch verio flex   losartan (COZAAR) 50 MG tablet Take 1 tablet (50 mg total) by mouth daily.   metoprolol succinate (TOPROL-XL) 50 MG 24 hr tablet Take 1 tablet (50 mg total) by mouth daily. Take with  or immediately following a meal. (Patient taking differently: Take 25 mg by mouth in the morning and at bedtime. Take with or immediately following a meal.)    mirtazapine (REMERON) 30 MG tablet Take 30 mg by mouth at bedtime.   OXYGEN Inhale 4-5 L into the lungs continuous.   SYMBICORT 160-4.5 MCG/ACT inhaler INHALE 2 PUFFS BY MOUTH IN THE MORNING AND EVENING (EVERY 12 HOURS)                                  Objective:    Wt  01/12/2022       178  04/30/2021         174  01/06/2021       178  11/20/2020       179   03/11/19 176 lb 9.6 oz (80.1 kg)  11/19/18 166 lb 9.6 oz (75.6 kg)  10/24/18 160 lb (72.6 kg)    Vital signs reviewed  01/12/2022  - Note at rest 02 sats  100% on 5lpm and 92% RA    General appearance:    amb bm easily cofused with details of care.    HEENT : Oropharynx c;ear   Nasal turbinates nl    NECK :  without  apparent JVD/ palpable Nodes/TM    LUNGS: no acc muscle use,  Mild barrel  contour chest wall with bilateral  Distant bs s audible wheeze and  without cough on insp or exp maneuvers  and mild  Hyperresonant  to  percussion bilaterally     CV:  RRR  no s3 or murmur or increase in P2, and no edema   ABD:  soft and nontender with pos end  insp Hoover's  in the supine position.  No bruits or organomegaly appreciated   MS:  Nl gait/ ext warm without deformities Or obvious joint restrictions  calf tenderness, cyanosis or clubbing     SKIN: warm and dry without lesions    NEURO:  alert, approp, nl sensorium with  no motor or cerebellar deficits apparent.        Assessment

## 2022-01-12 NOTE — Assessment & Plan Note (Addendum)
As of  11/19/2018  = 3lpm 24/7   - 03/11/2019 Patient Saturations on Room Air at Rest = 100%  Room Air while Ambulating = 88% and on 3 Liters of pulsed oxygen while Ambulating = 92% so approved for POC  - 04/30/2021   Walked on 5lpm cont  x  3  lap(s) =  approx750  ft  @ avg pace, stopped due to end of study, min sob  with lowest 02 sats 90%  - 01/12/2022   Walked on 5lpm   x  3  lap(s) =  approx 750  ft  @ slow  pace, stopped due to end of study  with lowest 02 sats 94%   Reminded: Make sure you check your oxygen saturation  AT  your highest level of activity (not after you stop)   to be sure it stays over 90% and adjust  02 flow upward to maintain this level if needed but remember to turn it back to previous settings when you stop (to conserve your supply).   F/u in 4 weeks with all meds in hand using a trust but verify approach to confirm accurate Medication  Reconciliation The principal here is that until we are certain that the  patients are doing what we've asked, it makes no sense to ask them to do more.   Each maintenance medication was reviewed in detail including emphasizing most importantly the difference between maintenance and prns and under what circumstances the prns are to be triggered using an action plan format where appropriate.  Total time for H and P, chart review, counseling, reviewing hfa/neb/smi/02 device(s) , directly observing portions of ambulatory 02 saturation study/ and generating customized AVS unique to this office visit / same day charting > 40 min for    refractory respiratory  symptoms likely due to non-adherence / poor insight into how/ when to take his meds for max benefit

## 2022-01-12 NOTE — Patient Instructions (Signed)
Plan A = Automatic = Always=    Breztri Take 2 puffs first thing in am and then another 2 puffs about 12 hours later.   Work on inhaler technique:  relax and gently blow all the way out then take a nice smooth full deep breath back in, triggering the inhaler at same time you start breathing in.  Hold breath in for at least  5 seconds if you can. Blow out breztri thru nose. Rinse and gargle with water when done.  If mouth or throat bother you at all,  try brushing teeth/gums/tongue with arm and hammer toothpaste/ make a slurry and gargle and spit out.    .remember how golfers take practice swings to use symbicort container before symbicort   Plan B = Backup (to supplement plan A, not to replace it) Only use your albuterol inhaler as a rescue medication to be used if you can't catch your breath by resting or doing a relaxed purse lip breathing pattern.  - The less you use it, the better it will work when you need it. - Ok to use the inhaler up to 2 puffs  every 4 hours if you must but call for appointment if use goes up over your usual need - Don't leave home without it !!  (think of it like the spare tire for your car)   Plan C = Crisis (instead of Plan B but only if Plan B stops working) - only use your albuterol nebulizer if you first try Plan B and it fails to help > ok to use the nebulizer up to every 4 hours but if start needing it regularly call for immediate appointment   Please schedule a follow up office visit in 4 weeks, sooner if needed  with all medications /inhalers/ solutions in hand so we can verify exactly what you are taking. This includes all medications from all doctors and over the counters

## 2022-01-12 NOTE — Assessment & Plan Note (Addendum)
Quit smoking 2015  - Spirometry 11/19/2018  FEV1 1.00 (36%)  Ratio 0.49  With no response  To saba on ? Prior to test - 03/11/2019  After extensive coaching inhaler device,  effectiveness  < 25% (way too fast, way short Ti, double trigger)  -  50% after coaching  - 01/06/2021  After extensive coaching inhaler device,  effectiveness =    90% with SMI > try stiolto  - 01/12/2022  After extensive coaching inhaler device,  effectiveness =    75% (short Ti ) try breztri 2bid x 2 week samples and if not improved ex tol over baselin resume symbicort 160 and saba/sama prn    Group D (now reclassified as E) in terms of symptom/risk and laba/lama/ICS  therefore appropriate rx at this point >>>  breztri and approp saba  Re SABA :  I spent extra time with pt today reviewing appropriate use of albuterol for prn use on exertion with the following points: 1) saba is for relief of sob that does not improve by walking a slower pace or resting but rather if the pt does not improve after trying this first. 2) If the pt is convinced, as many are, that saba helps recover from activity faster then it's easy to tell if this is the case by re-challenging : ie stop, take the inhaler, then p 5 minutes try the exact same activity (intensity of workload) that just caused the symptoms and see if they are substantially diminished or not after saba 3) if there is an activity that reproducibly causes the symptoms, try the saba 15 min before the activity on alternate days   If in fact the saba really does help, then fine to continue to use it prn but advised may need to look closer at the maintenance regimen being used to achieve better control of airways disease with exertion.    Advised:  formulary restrictions will be an ongoing challenge for the forseable future and I would be happy to pick an alternative if the pt will first  provide me a list of them -  pt  will need to return here for training for any new device that is required  eg dpi vs hfa vs respimat.    In the meantime we can always provide samples so that the patient never runs out of any needed respiratory medications.

## 2022-02-21 ENCOUNTER — Telehealth: Payer: Self-pay | Admitting: Internal Medicine

## 2022-02-21 MED ORDER — SYMBICORT 160-4.5 MCG/ACT IN AERO: 2.0000 | INHALATION_SPRAY | Freq: Two times a day (BID) | RESPIRATORY_TRACT | 5 refills | Status: DC

## 2022-02-21 MED ORDER — COMBIVENT RESPIMAT 20-100 MCG/ACT IN AERS: 1.0000 | INHALATION_SPRAY | Freq: Four times a day (QID) | RESPIRATORY_TRACT | 5 refills | Status: DC

## 2022-02-21 NOTE — Telephone Encounter (Signed)
Called and spoke with pt about his inhalers. Pt asked if he could use the Symbicort, Combivent, and also the [place] and I stated to him that the Judithann Sauger has three meds in one so he could not use it with the others. Pt said that he wanted to just use the Symbicort and Combivent so Rx for both have been sent to pharmacy for pt. Nothing further needed.

## 2022-02-22 ENCOUNTER — Ambulatory Visit: Payer: Non-veteran care | Admitting: Internal Medicine

## 2022-02-26 ENCOUNTER — Emergency Department (HOSPITAL_COMMUNITY): Payer: No Typology Code available for payment source

## 2022-02-26 ENCOUNTER — Other Ambulatory Visit: Payer: Self-pay

## 2022-02-26 ENCOUNTER — Encounter (HOSPITAL_COMMUNITY): Payer: Self-pay | Admitting: *Deleted

## 2022-02-26 ENCOUNTER — Emergency Department (HOSPITAL_COMMUNITY)
Admission: EM | Admit: 2022-02-26 | Discharge: 2022-02-26 | Disposition: A | Discharge status: Home / Self Care | Payer: No Typology Code available for payment source | Attending: Emergency Medicine | Admitting: Emergency Medicine

## 2022-02-26 DIAGNOSIS — R509 Fever, unspecified: Secondary | ICD-10-CM | POA: Diagnosis present

## 2022-02-26 DIAGNOSIS — U071 COVID-19: Secondary | ICD-10-CM | POA: Diagnosis not present

## 2022-02-26 LAB — CBC WITH DIFFERENTIAL/PLATELET
Abs Immature Granulocytes: 0.02 10*3/uL (ref 0.00–0.07)
Basophils Absolute: 0 10*3/uL (ref 0.0–0.1)
Basophils Relative: 0 %
Eosinophils Absolute: 0.1 10*3/uL (ref 0.0–0.5)
Eosinophils Relative: 1 %
HCT: 35.1 % — ABNORMAL LOW (ref 39.0–52.0)
Hemoglobin: 10.9 g/dL — ABNORMAL LOW (ref 13.0–17.0)
Immature Granulocytes: 0 %
Lymphocytes Relative: 11 %
Lymphs Abs: 0.6 10*3/uL — ABNORMAL LOW (ref 0.7–4.0)
MCH: 29.5 pg (ref 26.0–34.0)
MCHC: 31.1 g/dL (ref 30.0–36.0)
MCV: 95.1 fL (ref 80.0–100.0)
Monocytes Absolute: 0.8 10*3/uL (ref 0.1–1.0)
Monocytes Relative: 15 %
Neutro Abs: 3.8 10*3/uL (ref 1.7–7.7)
Neutrophils Relative %: 73 %
Platelets: 204 10*3/uL (ref 150–400)
RBC: 3.69 MIL/uL — ABNORMAL LOW (ref 4.22–5.81)
RDW: 13.8 % (ref 11.5–15.5)
WBC: 5.3 10*3/uL (ref 4.0–10.5)
nRBC: 0 % (ref 0.0–0.2)

## 2022-02-26 LAB — BRAIN NATRIURETIC PEPTIDE: B Natriuretic Peptide: 32.3 pg/mL (ref 0.0–100.0)

## 2022-02-26 LAB — COMPREHENSIVE METABOLIC PANEL
ALT: 17 U/L (ref 0–44)
AST: 22 U/L (ref 15–41)
Albumin: 4 g/dL (ref 3.5–5.0)
Alkaline Phosphatase: 77 U/L (ref 38–126)
Anion gap: 6 (ref 5–15)
BUN: 16 mg/dL (ref 8–23)
CO2: 30 mmol/L (ref 22–32)
Calcium: 8.9 mg/dL (ref 8.9–10.3)
Chloride: 103 mmol/L (ref 98–111)
Creatinine, Ser: 1.27 mg/dL — ABNORMAL HIGH (ref 0.61–1.24)
GFR, Estimated: >60 mL/min (ref >60)
Glucose, Bld: 115 mg/dL — ABNORMAL HIGH (ref 70–99)
Potassium: 4 mmol/L (ref 3.5–5.1)
Sodium: 139 mmol/L (ref 135–145)
Total Bilirubin: 0.7 mg/dL (ref 0.3–1.2)
Total Protein: 7.6 g/dL (ref 6.5–8.1)

## 2022-02-26 LAB — RESP PANEL BY RT-PCR (FLU A&B, COVID) ARPGX2
Influenza A by PCR: NEGATIVE
Influenza B by PCR: NEGATIVE
SARS Coronavirus 2 by RT PCR: POSITIVE — AB

## 2022-02-26 LAB — TROPONIN I (HIGH SENSITIVITY): Troponin I (High Sensitivity): 9 ng/L (ref <18)

## 2022-02-26 MED ORDER — ACETAMINOPHEN 325 MG PO TABS
650.0000 mg | ORAL_TABLET | Freq: Once | ORAL | Status: AC
  Administered 2022-02-26: 650 mg via ORAL
  Filled 2022-02-26: qty 2

## 2022-02-26 MED ORDER — MOLNUPIRAVIR EUA 200MG CAPSULE: 4.0000 | ORAL_CAPSULE | Freq: Two times a day (BID) | ORAL | 0 refills | Status: DC

## 2022-02-26 NOTE — ED Provider Notes (Signed)
[facility] DEPT Provider Note   CSN: [id number] Arrival date & time: 02/26/22  1730     History  Chief Complaint  Patient presents with   Fever    Gerald Williams is a 71 y.o. male.  71 year old male with prior medical history as detailed below presents for evaluation.  Patient reports onset of subjective fever yesterday.  Patient reports mild cough.  Patient reports that he is on 5 L nasal cannula at baseline.  Secondary to his symptoms he increased his O2 to 6 L.  He said this made him feel somewhat better.  He denies chest pain.  He denies subjective fever.  He did take some Tylenol with moderate control of his symptoms.  The history is provided by the patient and medical records.       Home Medications Prior to Admission medications   Medication Sig Start Date End Date Taking? Authorizing Provider  acetaminophen (TYLENOL) 500 MG tablet Take 1,000 mg by mouth every 6 (six) hours as needed for mild pain, fever or headache.    [provider]  albuterol (PROVENTIL) (2.5 MG/3ML) 0.083% nebulizer solution Take 2.5 mg by nebulization every 6 (six) hours as needed for wheezing or shortness of breath.    [provider]  atorvastatin (LIPITOR) 80 MG tablet Take 80 mg by mouth daily. 05/12/20   [provider]  diclofenac Sodium (VOLTAREN) 1 % GEL Apply 2 g topically 4 (four) times daily. 01/01/21   Volney American, PA-C  DULoxetine (CYMBALTA) 60 MG capsule TAKE ONE CAPSULE BY MOUTH TWO TIMES A DAY FOR POSTTRAUMATIC STRESS DISORDER AND DEPRESSION 09/15/20   [provider]  fluticasone (FLONASE) 50 MCG/ACT nasal spray Place 2 sprays into both nostrils daily as needed for allergies. 12/29/20   [provider]  furosemide (LASIX) 20 MG tablet Take 1 tablet (20 mg total) by mouth daily. 12/29/20   O'NealCassie Freer, MD  glucose blood (COOL BLOOD GLUCOSE TEST STRIPS) test strip Used to test blood sugar x2  daily---diagnosis code r73.03--for one touch verio flex 12/08/15   Burns, Claudina Lick, MD  guaiFENesin (MUCINEX) 600 MG 12 hr tablet Take 600 mg by mouth 2 (two) times daily as needed for to loosen phlegm. 02/07/20   [provider]  ipratropium (ATROVENT) 0.03 % nasal spray Place 1 spray into both nostrils every 8 (eight) hours as needed for rhinitis. 01/06/21   Tanda Rockers, MD  Ipratropium-Albuterol (COMBIVENT RESPIMAT) 20-100 MCG/ACT AERS respimat Inhale 1 puff into the lungs every 6 (six) hours. 02/21/22   Tanda Rockers, MD  Lancets MISC Used to test blood sugar x2 daily---diagnosis code r73.03--for one touch verio flex 12/08/15   Burns, Claudina Lick, MD  losartan (COZAAR) 50 MG tablet Take 1 tablet (50 mg total) by mouth daily. 05/12/21   [name], Cassie Freer, MD  metoprolol succinate (TOPROL-XL) 50 MG 24 hr tablet Take 1 tablet (50 mg total) by mouth daily. Take with or immediately following a meal. Patient taking differently: Take 25 mg by mouth in the morning and at bedtime. Take with or immediately following a meal. 12/11/20   [name], Cassie Freer, MD  mirtazapine (REMERON) 30 MG tablet Take 30 mg by mouth at bedtime.    [provider]  OXYGEN Inhale 4-5 L into the lungs continuous.    [provider]  SYMBICORT 160-4.5 MCG/ACT inhaler Inhale 2 puffs into the lungs in the morning and at bedtime. 02/21/22   Tanda Rockers,  MD      Allergies    Tiotropium, Gabapentin, Nortriptyline, Budeson-glycopyrrol-formoterol, Olodaterol, Oxcarbazepine, and Tiotropium bromide-olodaterol    Review of Systems   Review of Systems  All other systems reviewed and are negative.   Physical Exam Updated Vital Signs BP 138/82 (BP Location: Left Arm)   Pulse (!) 108   Temp 100.2 F (37.9 C) (Oral)   Resp 18   Wt 80.7 kg   SpO2 99%   BMI 29.17 kg/m  Physical Exam Vitals and nursing note reviewed.  Constitutional:      General: He is not in acute distress.    Appearance:  Normal appearance. He is well-developed.  HENT:     Head: Normocephalic and atraumatic.  Eyes:     Conjunctiva/sclera: Conjunctivae normal.     Pupils: Pupils are equal, round, and reactive to light.  Cardiovascular:     Rate and Rhythm: Normal rate and regular rhythm.     Heart sounds: Normal heart sounds.  Pulmonary:     Effort: Pulmonary effort is normal. No respiratory distress.     Breath sounds: Normal breath sounds.  Abdominal:     General: There is no distension.     Palpations: Abdomen is soft.     Tenderness: There is no abdominal tenderness.  Musculoskeletal:        General: No deformity. Normal range of motion.     Cervical back: Normal range of motion and neck supple.  Skin:    General: Skin is warm and dry.  Neurological:     General: No focal deficit present.     Mental Status: He is alert and oriented to person, place, and time.     ED Results / Procedures / Treatments   Labs (all labs ordered are listed, but only abnormal results are displayed) Labs Reviewed  RESP PANEL BY RT-PCR (FLU A&B, COVID) ARPGX2  COMPREHENSIVE METABOLIC PANEL  CBC WITH DIFFERENTIAL/PLATELET  BRAIN NATRIURETIC PEPTIDE  URINALYSIS, ROUTINE W REFLEX MICROSCOPIC  TROPONIN I (HIGH SENSITIVITY)    EKG EKG Interpretation  Date/Time:  Saturday February 26 2022 19:03:31 EDT Ventricular Rate:  97 PR Interval:  127 QRS Duration: 64 QT Interval:  436 QTC Calculation: 554 R Axis:   49 Text Interpretation: Sinus rhythm Borderline repolarization abnormality Prolonged QT interval Confirmed by Dene Gentry [phone]) on 02/26/2022 7:13:03 PM  Radiology No results found.  Procedures Procedures    Medications Ordered in ED Medications  acetaminophen (TYLENOL) tablet 650 mg (has no administration in time range)    ED Course/ Medical Decision Making/ A&P                           Medical Decision Making Amount and/or Complexity of Data Reviewed Labs: ordered.    Medical Screen  Complete  This patient presented to the ED with complaint of fever, URI.  This complaint involves an extensive number of treatment options. The initial differential diagnosis includes, but is not limited to, bacterial versus viral infection, metabolic abnormality, etc.  This presentation is: Acute, Chronic, Self-Limited, Previously Undiagnosed, Uncertain Prognosis, Complicated, Systemic Symptoms, and Threat to Life/Bodily Function  Patient with presentation suggestive of likely viral process.  Patient with longstanding history of CHF and requires O2 supplementation at baseline.  Work-up is without evidence of bacterial infection.  COVID test is positive.  Patient is status post vaccination x3 for COVID.  Patient denies prior COVID infection that he is aware of.  Patient is  agreeable with plan for discharge and use of oral antiviral medication.  Importance of close follow-up is stressed.  Strict return precautions given and understood. Additional history obtained:  External records from outside sources obtained and reviewed including prior ED visits and prior Inpatient records.    Lab Tests:  I ordered and personally interpreted labs.  The pertinent results include: CBC, CMP, troponin, BNP, COVID, flu   Imaging Studies ordered:  I ordered imaging studies including chest x-ray I independently visualized and interpreted obtained imaging which showed NAD I agree with the radiologist interpretation.   Cardiac Monitoring:  The patient was maintained on a cardiac monitor.  I personally viewed and interpreted the cardiac monitor which showed an underlying rhythm of: NSR   Medicines ordered:  I ordered medication including Tylenol for subjective fever Reevaluation of the patient after these medicines showed that the patient: improved  Problem List / ED Course:  COVID infection, acute   Reevaluation:  After the interventions noted above, I reevaluated the patient and found that  they have: improved  Disposition:  After consideration of the diagnostic results and the patients response to treatment, I feel that the patent would benefit from close outpatient follow-up.          Final Clinical Impression(s) / ED Diagnoses Final diagnoses:  COVID-19    Rx / DC Orders ED Discharge Orders          Ordered    molnupiravir EUA (LAGEVRIO) 200 mg CAPS capsule  2 times daily        02/26/22 2223              Valarie Merino, MD 02/26/22 2240

## 2022-02-26 NOTE — ED Provider Triage Note (Signed)
Emergency Medicine Provider Triage Evaluation Note  Gerald Williams , a 71 y.o. male  was evaluated in triage.  Pt complains of fever, headache, shortness of breath, chest pain and cough.  Patient states the symptoms began abruptly yesterday.  Patient reports headache is gradual onset and increasing in severity.  He tried at home Tylenol with minimal to no relief of symptoms.  History of CHF and on 5 to 6 L at baseline.  States he has not had to increase his oxygen at home.  Notes no increased swelling or weight gain.  Denies abdominal pain, nausea, vomiting, urinary symptoms, change in bowel habits.  Denies visual abnormalities, slurred speech, facial droop, weakness/sensory deficits, gait abnormalities..  Review of Systems  Positive: See above Negative:   Physical Exam  BP 138/82 (BP Location: Left Arm)   Pulse (!) 108   Temp 100.2 F (37.9 C) (Oral)   Resp 18   Wt 80.7 kg   SpO2 99%   BMI 29.17 kg/m  Gen:   Awake, no distress   Resp:  Normal effort  MSK:   Moves extremities without difficulty  Other:    Medical Decision Making  Medically screening exam initiated at 6:56 PM.  Appropriate orders placed.  Pawan Knechtel was informed that the remainder of the evaluation will be completed by another provider, this initial triage assessment does not replace that evaluation, and the importance of remaining in the ED until their evaluation is complete.     Wilnette Kales, [place] 02/26/22 1912

## 2022-02-26 NOTE — ED Triage Notes (Signed)
Here from home for fever, sob, CP, and productive cough. H/o CHF. Denies NVD, or swelling. Onset yesterday. Thought it was the flu, but was worse today. Wears 5L O2 [place] at home.

## 2022-02-26 NOTE — Discharge Instructions (Signed)
Return for any problem.  ?

## 2022-02-27 ENCOUNTER — Telehealth (HOSPITAL_COMMUNITY): Payer: Self-pay | Admitting: Emergency Medicine

## 2022-02-27 MED ORDER — MOLNUPIRAVIR EUA 200MG CAPSULE: 4.0000 | ORAL_CAPSULE | Freq: Two times a day (BID) | ORAL | 0 refills | Status: AC

## 2022-02-27 MED ORDER — MOLNUPIRAVIR EUA 200MG CAPSULE: 4.0000 | ORAL_CAPSULE | Freq: Two times a day (BID) | ORAL | 0 refills | Status: DC

## 2022-02-27 NOTE — Telephone Encounter (Signed)
Reprint prescription 

## 2022-02-27 NOTE — ED Notes (Signed)
Pt called stating the pharmacy does not have the medication prescribed. Spoke with Dr Wyvonnia Dusky who has printed the prescription to allow pt to take to another pharmacy to get filled. He has been updated and will come pick up prescription.

## 2022-03-11 ENCOUNTER — Telehealth: Payer: Self-pay | Admitting: Internal Medicine

## 2022-03-11 ENCOUNTER — Inpatient Hospital Stay (HOSPITAL_COMMUNITY)
Admission: EM | Admit: 2022-03-11 | Discharge: 2022-03-13 | DRG: 189 | Disposition: A | Discharge status: Home / Self Care | Payer: No Typology Code available for payment source | Attending: Internal Medicine | Admitting: Internal Medicine

## 2022-03-11 ENCOUNTER — Encounter (HOSPITAL_COMMUNITY): Payer: Self-pay

## 2022-03-11 ENCOUNTER — Other Ambulatory Visit: Payer: Self-pay

## 2022-03-11 ENCOUNTER — Emergency Department (HOSPITAL_COMMUNITY): Payer: No Typology Code available for payment source

## 2022-03-11 DIAGNOSIS — Z87891 Personal history of nicotine dependence: Secondary | ICD-10-CM | POA: Diagnosis not present

## 2022-03-11 DIAGNOSIS — Z79899 Other long term (current) drug therapy: Secondary | ICD-10-CM

## 2022-03-11 DIAGNOSIS — Z888 Allergy status to other drugs, medicaments and biological substances status: Secondary | ICD-10-CM | POA: Diagnosis not present

## 2022-03-11 DIAGNOSIS — I110 Hypertensive heart disease with heart failure: Secondary | ICD-10-CM | POA: Diagnosis present

## 2022-03-11 DIAGNOSIS — I5022 Chronic systolic (congestive) heart failure: Secondary | ICD-10-CM | POA: Diagnosis present

## 2022-03-11 DIAGNOSIS — G4733 Obstructive sleep apnea (adult) (pediatric): Secondary | ICD-10-CM | POA: Diagnosis present

## 2022-03-11 DIAGNOSIS — J9601 Acute respiratory failure with hypoxia: Principal | ICD-10-CM

## 2022-03-11 DIAGNOSIS — J9621 Acute and chronic respiratory failure with hypoxia: Principal | ICD-10-CM | POA: Diagnosis present

## 2022-03-11 DIAGNOSIS — Z8249 Family history of ischemic heart disease and other diseases of the circulatory system: Secondary | ICD-10-CM

## 2022-03-11 DIAGNOSIS — G4730 Sleep apnea, unspecified: Secondary | ICD-10-CM | POA: Diagnosis present

## 2022-03-11 DIAGNOSIS — J441 Chronic obstructive pulmonary disease with (acute) exacerbation: Secondary | ICD-10-CM | POA: Diagnosis present

## 2022-03-11 DIAGNOSIS — U071 COVID-19: Secondary | ICD-10-CM | POA: Diagnosis present

## 2022-03-11 DIAGNOSIS — Z9981 Dependence on supplemental oxygen: Secondary | ICD-10-CM

## 2022-03-11 DIAGNOSIS — F4310 Post-traumatic stress disorder, unspecified: Secondary | ICD-10-CM | POA: Diagnosis present

## 2022-03-11 DIAGNOSIS — R079 Chest pain, unspecified: Secondary | ICD-10-CM | POA: Diagnosis present

## 2022-03-11 DIAGNOSIS — J439 Emphysema, unspecified: Secondary | ICD-10-CM | POA: Diagnosis present

## 2022-03-11 DIAGNOSIS — E785 Hyperlipidemia, unspecified: Secondary | ICD-10-CM | POA: Diagnosis present

## 2022-03-11 DIAGNOSIS — I10 Essential (primary) hypertension: Secondary | ICD-10-CM | POA: Diagnosis present

## 2022-03-11 DIAGNOSIS — J9611 Chronic respiratory failure with hypoxia: Secondary | ICD-10-CM | POA: Diagnosis present

## 2022-03-11 LAB — CBC
HCT: 38.9 % — ABNORMAL LOW (ref 39.0–52.0)
Hemoglobin: 12.1 g/dL — ABNORMAL LOW (ref 13.0–17.0)
MCH: 29.4 pg (ref 26.0–34.0)
MCHC: 31.1 g/dL (ref 30.0–36.0)
MCV: 94.6 fL (ref 80.0–100.0)
Platelets: 318 10*3/uL (ref 150–400)
RBC: 4.11 MIL/uL — ABNORMAL LOW (ref 4.22–5.81)
RDW: 13.8 % (ref 11.5–15.5)
WBC: 9.1 10*3/uL (ref 4.0–10.5)
nRBC: 0 % (ref 0.0–0.2)

## 2022-03-11 LAB — BLOOD GAS, VENOUS
Acid-Base Excess: 5.8 mmol/L — ABNORMAL HIGH (ref 0.0–2.0)
Bicarbonate: 32.4 mmol/L — ABNORMAL HIGH (ref 20.0–28.0)
O2 Saturation: 88.1 %
Patient temperature: 37
pCO2, Ven: 56 mmHg (ref 44–60)
pH, Ven: 7.37 (ref 7.25–7.43)
pO2, Ven: 53 mmHg — ABNORMAL HIGH (ref 32–45)

## 2022-03-11 LAB — BRAIN NATRIURETIC PEPTIDE: B Natriuretic Peptide: 149 pg/mL — ABNORMAL HIGH (ref 0.0–100.0)

## 2022-03-11 LAB — BASIC METABOLIC PANEL
Anion gap: 9 (ref 5–15)
BUN: 26 mg/dL — ABNORMAL HIGH (ref 8–23)
CO2: 29 mmol/L (ref 22–32)
Calcium: 9.5 mg/dL (ref 8.9–10.3)
Chloride: 103 mmol/L (ref 98–111)
Creatinine, Ser: 1.34 mg/dL — ABNORMAL HIGH (ref 0.61–1.24)
GFR, Estimated: 57 mL/min — ABNORMAL LOW (ref >60)
Glucose, Bld: 217 mg/dL — ABNORMAL HIGH (ref 70–99)
Potassium: 4 mmol/L (ref 3.5–5.1)
Sodium: 141 mmol/L (ref 135–145)

## 2022-03-11 LAB — TROPONIN I (HIGH SENSITIVITY): Troponin I (High Sensitivity): 11 ng/L (ref <18)

## 2022-03-11 MED ORDER — SODIUM CHLORIDE 0.9 % IV SOLN
100.0000 mg | Freq: Once | INTRAVENOUS | Status: AC
  Administered 2022-03-11: 100 mg via INTRAVENOUS
  Filled 2022-03-11: qty 100

## 2022-03-11 MED ORDER — SODIUM CHLORIDE 0.9 % IV BOLUS
500.0000 mL | Freq: Once | INTRAVENOUS | Status: AC
  Administered 2022-03-11: 500 mL via INTRAVENOUS

## 2022-03-11 MED ORDER — METHYLPREDNISOLONE SODIUM SUCC 125 MG IJ SOLR
62.5000 mg | Freq: Once | INTRAMUSCULAR | Status: AC
  Administered 2022-03-11: 62.5 mg via INTRAVENOUS
  Filled 2022-03-11: qty 2

## 2022-03-11 NOTE — Telephone Encounter (Signed)
Called and spoke with pt who states he tested positive for covid about 10-15 days ago and was prescribed medication to help out with this.  Pt said that he had to bump his O2 up to 6-8L to help with his breathing.   Pt said the [place] had it written down that he should be using no oxygen at rest and using around 4L with exertion. States that if he has his oxygen off, he is satting around 65% at rest with no oxygen so due to this he has been wearing his oxygen all the time and states even when was using the oxygen at 4L, he still felt like he was not getting enough oxygen.  Pt said his home concentrator only goes up to 5L and states that he has been having to use his emergency tank when the power goes out.  With all that has been going on, pt wants to know what could be recommended. Stated to pt that we could get him in for an appt 11/17 and he said he would take it. Appt scheduled for pt. Nothing further needed.

## 2022-03-11 NOTE — H&P (Signed)
History and Physical    Patient: Gerald Williams DOB: September 27, 1950 DOA: 03/11/2022 DOS: the patient was seen and examined on 03/11/2022 [facility]  Patient coming from: Home  Chief Complaint:  Chief Complaint  Patient presents with   Shortness of Breath   HPI: Gerald Williams is a 71 y.o. male with medical history significant of COPD, chronic respiratory failure on 4 to 5 L of oxygen at home, systolic dysfunction CHF, hyperlipidemia, essential hypertension, prior history of tobacco and cocaine abuse, obstructive sleep apnea, who was diagnosed with COVID-19 apparently about 10-15 days ago.  He has been recovering doing better but today he woke up with significant worsening of his shortness of breath.  Also cough.  Very hypoxic.  When EMS arrived patient was hypoxic and was placed on 12 L of oxygen by nasal cannula after given DuoNeb.  Patient brought to the ER where he is now being evaluated.  He is currently on BiPAP and is being admitted to the hospital with acute on chronic respiratory failure with hypoxemia.  Patient spoke with his pulmonologist earlier today.  At that point his oxygen was bumped to 68 L but that still did not improve his breathing.  At the moment no fever or chills.  Patient is not able to give much history as he is on BiPAP.  He is being admitted there for for further evaluation and treatment.  Review of Systems: As mentioned in the history of present illness. All other systems reviewed and are negative. Past Medical History:  Diagnosis Date   CHF (congestive heart failure) (HCC)    Emphysema    Erectile dysfunction    Hyperlipemia    Hypertension    Polysubstance abuse (HCC)    stopped using crack/cocaine December 4th 20-14   Pre-diabetes    Sleep apnea    Past Surgical History:  Procedure Laterality Date   CARDIAC CATHETERIZATION     CYSTOSCOPY WITH INSERTION OF UROLIFT N/A 08/03/2020   Procedure: CYSTOSCOPY WITH INSERTION OF  UROLIFT;  Surgeon: Marcine Matar, MD;  Location: [facility] OR[facility];  Service: Urology;  Laterality: N/A;  30 MINS   CYSTOSCOPY WITH INSERTION OF UROLIFT N/A 12/13/2021   Procedure: CYSTOSCOPY WITH INSERTION OF UROLIFT;  Surgeon: Marcine Matar, MD;  Location: [facility];  Service: Urology;  Laterality: N/A;  30 MINS   LEFT HEART CATHETERIZATION WITH CORONARY ANGIOGRAM N/A 07/15/2014   Procedure: LEFT HEART CATHETERIZATION WITH CORONARY ANGIOGRAM;  Surgeon: Lennette Bihari, MD;  Location: [facility] CATH LAB;  Service: Cardiovascular;  Laterality: N/A;   RIGHT/LEFT HEART CATH AND CORONARY ANGIOGRAPHY N/A 10/30/2020   Procedure: RIGHT/LEFT HEART CATH AND CORONARY ANGIOGRAPHY;  Surgeon: Iran Ouch, MD;  Location: [facility] INVASIVE CV LAB;  Service: Cardiovascular;  Laterality: N/A;   Social History:  reports that he quit smoking about 8 years ago. His smoking use included cigarettes. He started smoking about 58 years ago. He has a 75.00 pack-year smoking history. He has never used smokeless tobacco. He reports that he does not currently use alcohol. He reports that he does not use drugs.  Allergies  Allergen Reactions   Tiotropium Cough    Other reaction(s): Dysphagia,   Gabapentin     Other reaction(s): Dizziness   Nortriptyline     Other reaction(s): Drowsy   Budeson-Glycopyrrol-Formoterol Other (See Comments)    Other reaction(s): Retention of urine, Constipation, Retention of urine, Constipation   Olodaterol Hives and Rash   Oxcarbazepine Nausea And Vomiting   Tiotropium  Bromide-Olodaterol Itching    Family History  Problem Relation Age of Onset   Diabetes Mother        Deceased   Heart disease Father        Deceased   Heart attack Father        x5   Cancer - Other Sister    Healthy Son    Healthy Daughter    Heart attack Brother        Deceased   Alcohol abuse Brother        Deceased    Prior to Admission medications   Medication Sig Start Date End Date Taking? Authorizing Provider   acetaminophen (TYLENOL) 500 MG tablet Take 1,000 mg by mouth every 6 (six) hours as needed for mild pain, fever or headache.   Yes [provider]  albuterol (PROVENTIL) (2.5 MG/3ML) 0.083% nebulizer solution Take 2.5 mg by nebulization every 6 (six) hours as needed for wheezing or shortness of breath.   Yes [provider]  atorvastatin (LIPITOR) 80 MG tablet Take 80 mg by mouth daily. 05/12/20  Yes [provider]  diclofenac Sodium (VOLTAREN) 1 % GEL Apply 2 g topically 4 (four) times daily. Patient taking differently: Apply 2 g topically daily as needed (for pain). 01/01/21  Yes Particia Nearing, PA-C  DULoxetine (CYMBALTA) 60 MG capsule Take 60 mg by mouth 2 (two) times daily. 09/15/20  Yes [provider]  fluticasone (FLONASE) 50 MCG/ACT nasal spray Place 2 sprays into both nostrils daily as needed for allergies. 12/29/20  Yes [provider]  furosemide (LASIX) 20 MG tablet Take 1 tablet (20 mg total) by mouth daily. 12/29/20  Yes [name], [name], MD  guaiFENesin (MUCINEX) 600 MG 12 hr tablet Take 600 mg by mouth 2 (two) times daily as needed for to loosen phlegm. 02/07/20  Yes [provider]  ipratropium (ATROVENT) 0.03 % nasal spray Place 1 spray into both nostrils every 8 (eight) hours as needed for rhinitis. 01/06/21  Yes [name], MD  Ipratropium-Albuterol (COMBIVENT RESPIMAT) 20-100 MCG/ACT AERS respimat Inhale 1 puff into the lungs every 6 (six) hours. Patient taking differently: Inhale 1 puff into the lungs every 6 (six) hours as needed for shortness of breath. 02/21/22  Yes [name], MD  losartan (COZAAR) 50 MG tablet Take 1 tablet (50 mg total) by mouth daily. 05/12/21  Yes [name], [name], MD  metoprolol succinate (TOPROL-XL) 50 MG 24 hr tablet Take 1 tablet (50 mg total) by mouth daily. Take with or immediately following a meal. Patient taking differently: Take 25 mg by mouth in the morning and at  bedtime. Take with or immediately following a meal. 12/11/20  Yes [name], [name], MD  mirtazapine (REMERON) 30 MG tablet Take 30 mg by mouth at bedtime.   Yes [provider]  prazosin (MINIPRESS) 2 MG capsule Take 2 mg by mouth at bedtime. 08/23/21  Yes [provider]  prazosin (MINIPRESS) 5 MG capsule Take 1 capsule by mouth at bedtime. 08/23/21  Yes [provider]  rosuvastatin (CRESTOR) 10 MG tablet Take 10 mg by mouth daily.   Yes [provider]  sacubitril-valsartan (ENTRESTO) 97-103 MG Take 1 tablet by mouth 2 (two) times daily. 12/02/21  Yes [provider]  SYMBICORT 160-4.5 MCG/ACT inhaler Inhale 2 puffs into the lungs in the morning and at bedtime. 02/21/22  Yes [name], MD  glucose blood (COOL BLOOD GLUCOSE TEST STRIPS) test strip Used to  test blood sugar x2 daily---diagnosis code r73.03--for one touch verio flex 12/08/15   [name], MD  Lancets MISC Used to test blood sugar x2 daily---diagnosis code r73.03--for one touch verio flex 12/08/15   [name], MD  OXYGEN Inhale 4-5 L into the lungs continuous.    [provider]    Physical Exam: Vitals:   03/11/22 2000 03/11/22 2030 03/11/22 2031 03/11/22 2130  BP: (!) 129/91 108/81  125/87  Pulse: (!) 117 (!) 109  87  Resp: (!) 26 (!) 25  (!) 23  Temp:   97.8 F (36.6 C)   TempSrc:   Oral   SpO2: 100% 100%  99%   Constitutional: Acutely ill looking in respiratory distress, Eyes: PERRL, lids and conjunctivae normal ENMT: Mucous membranes are dry. Posterior pharynx clear of any exudate or lesions.Normal dentition.  Neck: normal, supple, no masses, no thyromegaly Respiratory: Decreased air entry bilateral lower with marked expiratory wheezing.  Use of extra muscle respiration. Cardiovascular: Sinus tachycardia, no murmurs / rubs / gallops. No extremity edema. 2+ pedal pulses. No carotid bruits.  Abdomen: no tenderness, no masses palpated. No  hepatosplenomegaly. Bowel sounds positive.  Musculoskeletal: Good range of motion, no joint swelling or tenderness, Skin: no rashes, lesions, ulcers. No induration Neurologic: CN 2-12 grossly intact. Sensation intact, DTR normal. Strength 5/5 in all 4.  Psychiatric: Normal judgment and insight. Alert and oriented x 3.  Anxious mood  Data Reviewed:  Temperature is 97.8, blood pressure 154/79, pulse 129, respirate 30, oxygen sat 99% currently on CPAP White count is 9.1, hemoglobin 12.1 and platelets 318, BUN 26 creatinine 1.34, glucose 217, chest x-ray showed no active disease.  Assessment and Plan:  #1 acute on chronic hypoxic respiratory failure: Secondary to COPD exacerbation.  Probably triggered by COVID-19 infection.  Patient is currently on BiPAP being transitioned to CPAP.  We will initiate IV steroid, antibiotic, breathing treatment.  Treat him as COPD gold.  Continue to titrate oxygen down.  #2 COVID-19 infection: Our record shows that he was diagnosed on November 4.  Patient is therefore more than 10 days out.  Chest x-ray showed no infiltrates.  No active treatment for COVID-19 therefore.  #3 COPD exacerbation: As per above.  Continue home Symbicort.  Also Flonase.  Continue treatment for acute exacerbation.  #4 essential hypertension: On metoprolol and losartan at home.  Continue treatment.  #5 hyperlipidemia: Patient on Lipitor 80 mg daily.  We will continue  #6 history of PTSD: Counseling provided.  #7 obstructive sleep apnea: Normally on CPAP at home.  Currently on BiPAP transition to CPAP.   Advance Care Planning:   Code Status: Prior full code  Consults: None  Family Communication: No family at bedside  Severity of Illness: The appropriate patient status for this patient is INPATIENT. Inpatient status is judged to be reasonable and necessary in order to provide the required intensity of service to ensure the patient's safety. The patient's presenting symptoms, physical  exam findings, and initial radiographic and laboratory data in the context of their chronic comorbidities is felt to place them at high risk for further clinical deterioration. Furthermore, it is not anticipated that the patient will be medically stable for discharge from the hospital within 2 midnights of admission.   * I certify that at the point of admission it is my clinical judgment that the patient will require inpatient hospital care spanning beyond 2 midnights from the point of admission due to high intensity of service, high  risk for further deterioration and high frequency of surveillance required.*  AuthorLonia Blood, MD 03/11/2022 11:17 PM  For on call review [email or web address].

## 2022-03-11 NOTE — ED Provider Notes (Signed)
[facility]-EMERGENCY DEPT Provider Note   CSN: [id number] Arrival date & time: 03/11/22  1931     History {Add pertinent medical, surgical, social history, OB history to HPI:1} Chief Complaint  Patient presents with   Shortness of Breath    Gerald Williams is a 71 y.o. male.  Patient as above with significant medical history as below, including CHF, COPD, HLD, hypertension, polysubstance abuse, sleep apnea, home oxygen use who presents to the ED with complaint of DIB.  Patient diagnosed COVID around a week ago, symptoms worse in the past 24 hours, fevers, productive cough with green sputum.  Difficulty breathing.  Turned his home nasal cannula up to 12 L (typically on 5-6L) today due to severe dyspnea.  On EMS arrival to his residence he was respiratory distress, placed on CPAP with improvement.  Given DuoNeb x2, mag sulfate 2 g with continued improvement to respiratory status.  On arrival patient transition to BiPAP.  Does report chest tightness/intermittent pain.  Improvement to his DIB.  Body aches.  Reports compliance no medications, no tobacco use ongoing.     Past Medical History:  Diagnosis Date   CHF (congestive heart failure) (HCC)    Emphysema    Erectile dysfunction    Hyperlipemia    Hypertension    Polysubstance abuse (HCC)    stopped using crack/cocaine December 4th 20-14   Pre-diabetes    Sleep apnea     Past Surgical History:  Procedure Laterality Date   CARDIAC CATHETERIZATION     CYSTOSCOPY WITH INSERTION OF UROLIFT N/A 08/03/2020   Procedure: CYSTOSCOPY WITH INSERTION OF UROLIFT;  Surgeon: Marcine Matar, MD;  Location: [facility];  Service: Urology;  Laterality: N/A;  30 MINS   CYSTOSCOPY WITH INSERTION OF UROLIFT N/A 12/13/2021   Procedure: CYSTOSCOPY WITH INSERTION OF UROLIFT;  Surgeon: Marcine Matar, MD;  Location: [facility];  Service: Urology;  Laterality: N/A;  30 MINS   LEFT HEART CATHETERIZATION WITH CORONARY ANGIOGRAM N/A  07/15/2014   Procedure: LEFT HEART CATHETERIZATION WITH CORONARY ANGIOGRAM;  Surgeon: Lennette Bihari, MD;  Location: [facility] CATH LAB;  Service: Cardiovascular;  Laterality: N/A;   RIGHT/LEFT HEART CATH AND CORONARY ANGIOGRAPHY N/A 10/30/2020   Procedure: RIGHT/LEFT HEART CATH AND CORONARY ANGIOGRAPHY;  Surgeon: Iran Ouch, MD;  Location: [facility] INVASIVE CV LAB;  Service: Cardiovascular;  Laterality: N/A;     The history is provided by the patient and the EMS personnel. No language interpreter was used.  Shortness of Breath Associated symptoms: chest pain, cough and fever   Associated symptoms: no abdominal pain, no headaches, no rash and no vomiting        Home Medications Prior to Admission medications   Medication Sig Start Date End Date Taking? Authorizing Provider  acetaminophen (TYLENOL) 500 MG tablet Take 1,000 mg by mouth every 6 (six) hours as needed for mild pain, fever or headache.    [provider]  albuterol (PROVENTIL) (2.5 MG/3ML) 0.083% nebulizer solution Take 2.5 mg by nebulization every 6 (six) hours as needed for wheezing or shortness of breath.    [provider]  atorvastatin (LIPITOR) 80 MG tablet Take 80 mg by mouth daily. 05/12/20   [provider]  diclofenac Sodium (VOLTAREN) 1 % GEL Apply 2 g topically 4 (four) times daily. Patient taking differently: Apply 2 g topically daily as needed (for pain). 01/01/21   Particia Nearing, PA-C  DULoxetine (CYMBALTA) 60 MG capsule Take 60 mg by mouth 2 (two) times  daily. 09/15/20   [provider]  fluticasone (FLONASE) 50 MCG/ACT nasal spray Place 2 sprays into both nostrils daily as needed for allergies. 12/29/20   [provider]  furosemide (LASIX) 20 MG tablet Take 1 tablet (20 mg total) by mouth daily. 12/29/20   O'NealRonnald Ramp, MD  glucose blood (COOL BLOOD GLUCOSE TEST STRIPS) test strip Used to test blood sugar x2 daily---diagnosis code r73.03--for one touch verio flex  12/08/15   Burns, [name], MD  guaiFENesin (MUCINEX) 600 MG 12 hr tablet Take 600 mg by mouth 2 (two) times daily as needed for to loosen phlegm. 02/07/20   [provider]  ipratropium (ATROVENT) 0.03 % nasal spray Place 1 spray into both nostrils every 8 (eight) hours as needed for rhinitis. 01/06/21   [name], MD  Ipratropium-Albuterol (COMBIVENT RESPIMAT) 20-100 MCG/ACT AERS respimat Inhale 1 puff into the lungs every 6 (six) hours. Patient taking differently: Inhale 1 puff into the lungs every 6 (six) hours as needed for shortness of breath. 02/21/22   [name], MD  Lancets MISC Used to test blood sugar x2 daily---diagnosis code r73.03--for one touch verio flex 12/08/15   Burns, [name], MD  losartan (COZAAR) 50 MG tablet Take 1 tablet (50 mg total) by mouth daily. 05/12/21   [name], [name], MD  metoprolol succinate (TOPROL-XL) 50 MG 24 hr tablet Take 1 tablet (50 mg total) by mouth daily. Take with or immediately following a meal. Patient taking differently: Take 25 mg by mouth in the morning and at bedtime. Take with or immediately following a meal. 12/11/20   [name], [name], MD  mirtazapine (REMERON) 30 MG tablet Take 30 mg by mouth at bedtime.    [provider]  OXYGEN Inhale 4-5 L into the lungs continuous.    [provider]  prazosin (MINIPRESS) 2 MG capsule Take 2 mg by mouth at bedtime. 08/23/21   [provider]  prazosin (MINIPRESS) 5 MG capsule Take 1 capsule by mouth at bedtime. 08/23/21   [provider]  rosuvastatin (CRESTOR) 10 MG tablet Take 10 mg by mouth daily.    [provider]  sacubitril-valsartan (ENTRESTO) 97-103 MG Take 1 tablet by mouth 2 (two) times daily. 12/02/21   [provider]  SYMBICORT 160-4.5 MCG/ACT inhaler Inhale 2 puffs into the lungs in the morning and at bedtime. 02/21/22   [name], MD      Allergies    Tiotropium, Gabapentin, Nortriptyline,  Budeson-glycopyrrol-formoterol, Olodaterol, Oxcarbazepine, and Tiotropium bromide-olodaterol    Review of Systems   Review of Systems  Constitutional:  Positive for fever. Negative for chills.  HENT:  Negative for facial swelling and trouble swallowing.   Eyes:  Negative for photophobia and visual disturbance.  Respiratory:  Positive for cough, chest tightness and shortness of breath.   Cardiovascular:  Positive for chest pain. Negative for palpitations.  Gastrointestinal:  Negative for abdominal pain, nausea and vomiting.  Endocrine: Negative for polydipsia and polyuria.  Genitourinary:  Negative for difficulty urinating and hematuria.  Musculoskeletal:  Negative for gait problem and joint swelling.  Skin:  Negative for pallor and rash.  Neurological:  Negative for syncope and headaches.  Psychiatric/Behavioral:  Negative for agitation and confusion.     Physical Exam Updated Vital Signs There were no vitals taken for this visit. Physical Exam Vitals and nursing note reviewed.  Constitutional:      General: He is in acute distress.  Appearance: He is well-developed. He is obese. He is ill-appearing.  HENT:     Head: Normocephalic and atraumatic.     Right Ear: External ear normal.     Left Ear: External ear normal.     Mouth/Throat:     Mouth: Mucous membranes are moist.  Eyes:     General: No scleral icterus. Cardiovascular:     Rate and Rhythm: Regular rhythm. Tachycardia present.     Pulses: Normal pulses.     Heart sounds: Normal heart sounds.     No S3 or S4 sounds.  Pulmonary:     Effort: Tachypnea, accessory muscle usage and respiratory distress present.     Breath sounds: Decreased breath sounds and wheezing present.  Abdominal:     General: Abdomen is flat.     Palpations: Abdomen is soft.     Tenderness: There is no abdominal tenderness.  Musculoskeletal:        General: Normal range of motion.     Cervical back: Normal range of motion.     Right lower  leg: No edema.     Left lower leg: No edema.  Skin:    General: Skin is warm and dry.     Capillary Refill: Capillary refill takes less than 2 seconds.  Neurological:     Mental Status: He is alert and oriented to person, place, and time.  Psychiatric:        Mood and Affect: Mood normal.        Behavior: Behavior normal.     ED Results / Procedures / Treatments   Labs (all labs ordered are listed, but only abnormal results are displayed) Labs Reviewed  CBC  BASIC METABOLIC PANEL  BLOOD GAS, VENOUS  BRAIN NATRIURETIC PEPTIDE  TROPONIN I (HIGH SENSITIVITY)    EKG None  Radiology No results found.  Procedures Procedures  {Document cardiac monitor, telemetry assessment procedure when appropriate:1}  Medications Ordered in ED Medications  methylPREDNISolone sodium succinate (SOLU-MEDROL) 125 mg/2 mL injection 62.5 mg (has no administration in time range)  sodium chloride 0.9 % bolus 500 mL (has no administration in time range)    ED Course/ Medical Decision Making/ A&P                           Medical Decision Making Amount and/or Complexity of Data Reviewed Labs: ordered. Radiology: ordered.  Risk Prescription drug management. Decision regarding hospitalization.   This patient presents to the ED with chief complaint(s) of DIB with pertinent past medical history of CHF, COPD which further complicates the presenting complaint. The complaint involves an extensive differential diagnosis and also carries with it a high risk of complications and morbidity.    The differential diagnosis includes but not limited to In my evaluation of this patient's dyspnea my DDx includes, but is not limited to, pneumonia, pulmonary embolism, pneumothorax, pulmonary edema, metabolic acidosis, asthma, COPD, cardiac cause, anemia, anxiety, etc.   Differential includes all life-threatening causes for chest pain. This includes but is not exclusive to acute coronary syndrome, aortic  dissection, pulmonary embolism, cardiac tamponade, community-acquired pneumonia, pericarditis, musculoskeletal chest wall pain, etc.  . Serious etiologies were considered.   The initial plan is to start on BiPAP, give Solu-Medrol, screening labs imaging   Additional history obtained: Additional history obtained from EMS  Records reviewed  prior ed visits, prior labs/imaging Diagnosed covid 19 11/4  Independent labs interpretation:  The following labs were independently interpreted:  Independent visualization of imaging: - I independently visualized the following imaging with scope of interpretation limited to determining acute life threatening conditions related to emergency care: ***, which revealed ***  Cardiac monitoring was reviewed and interpreted by myself which shows ***  Treatment and Reassessment: ***  Consultation: - Consulted or discussed management/test interpretation w/ external professional: ***  Consideration for admission or further workup: Admission was considered ***  Social Determinants of health: Social History   Tobacco Use   Smoking status: Former    Packs/day: 1.50    Years: 50.00    Total pack years: 75.00    Types: Cigarettes    Start date: 04/26/1963    Quit date: 09/22/2013    Years since quitting: 8.4   Smokeless tobacco: Never   Tobacco comments:    patient using the patch, losenges, and medication  Vaping Use   Vaping Use: Never used  Substance Use Topics   Alcohol use: Not Currently    Comment: He was drinking 3-4 drinks nightly x 40 years, quit in 2014.     Drug use: No    Comment: clean x 12 years      {Document critical care time when appropriate:1} {Document review of labs and clinical decision tools ie heart score, Chads2Vasc2 etc:1}  {Document your independent review of radiology images, and any outside records:1} {Document your discussion with family members, caretakers, and with consultants:1} {Document social determinants  of health affecting pt's care:1} {Document your decision making why or why not admission, treatments were needed:1} Final Clinical Impression(s) / ED Diagnoses Final diagnoses:  None    Rx / DC Orders ED Discharge Orders     None

## 2022-03-11 NOTE — ED Triage Notes (Signed)
Pt BIB [facility] from home. Co SOB. Upon ems arrival pt sats 99 on 12L nasal canula. Pt hx of covid 1 week ago. Reports increasing SOB today. Ems administered 2 duoneb 2mg  mag. No fever reported.

## 2022-03-12 DIAGNOSIS — J9621 Acute and chronic respiratory failure with hypoxia: Secondary | ICD-10-CM | POA: Diagnosis not present

## 2022-03-12 LAB — COMPREHENSIVE METABOLIC PANEL
ALT: 16 U/L (ref 0–44)
AST: 26 U/L (ref 15–41)
Albumin: 3.6 g/dL (ref 3.5–5.0)
Alkaline Phosphatase: 58 U/L (ref 38–126)
Anion gap: 4 — ABNORMAL LOW (ref 5–15)
BUN: 25 mg/dL — ABNORMAL HIGH (ref 8–23)
CO2: 29 mmol/L (ref 22–32)
Calcium: 9 mg/dL (ref 8.9–10.3)
Chloride: 108 mmol/L (ref 98–111)
Creatinine, Ser: 1.14 mg/dL (ref 0.61–1.24)
GFR, Estimated: >60 mL/min (ref >60)
Glucose, Bld: 160 mg/dL — ABNORMAL HIGH (ref 70–99)
Potassium: 4.4 mmol/L (ref 3.5–5.1)
Sodium: 141 mmol/L (ref 135–145)
Total Bilirubin: 0.6 mg/dL (ref 0.3–1.2)
Total Protein: 7.1 g/dL (ref 6.5–8.1)

## 2022-03-12 LAB — CBC
HCT: 32.9 % — ABNORMAL LOW (ref 39.0–52.0)
Hemoglobin: 10.3 g/dL — ABNORMAL LOW (ref 13.0–17.0)
MCH: 29.5 pg (ref 26.0–34.0)
MCHC: 31.3 g/dL (ref 30.0–36.0)
MCV: 94.3 fL (ref 80.0–100.0)
Platelets: 248 10*3/uL (ref 150–400)
RBC: 3.49 MIL/uL — ABNORMAL LOW (ref 4.22–5.81)
RDW: 14 % (ref 11.5–15.5)
WBC: 6.2 10*3/uL (ref 4.0–10.5)
nRBC: 0 % (ref 0.0–0.2)

## 2022-03-12 LAB — PROCALCITONIN: Procalcitonin: 0.14 ng/mL

## 2022-03-12 LAB — HIV ANTIBODY (ROUTINE TESTING W REFLEX): HIV Screen 4th Generation wRfx: NONREACTIVE

## 2022-03-12 LAB — D-DIMER, QUANTITATIVE: D-Dimer, Quant: 0.59 ug/mL-FEU — ABNORMAL HIGH (ref 0.00–0.50)

## 2022-03-12 MED ORDER — LOSARTAN POTASSIUM 50 MG PO TABS
50.0000 mg | ORAL_TABLET | Freq: Every day | ORAL | Status: DC
  Administered 2022-03-13: 50 mg via ORAL
  Filled 2022-03-12 (×2): qty 1

## 2022-03-12 MED ORDER — DULOXETINE HCL 60 MG PO CPEP
60.0000 mg | ORAL_CAPSULE | Freq: Two times a day (BID) | ORAL | Status: DC
  Filled 2022-03-12 (×2): qty 1

## 2022-03-12 MED ORDER — PREDNISONE 20 MG PO TABS
40.0000 mg | ORAL_TABLET | Freq: Every day | ORAL | Status: DC
  Administered 2022-03-13: 40 mg via ORAL
  Filled 2022-03-12: qty 2

## 2022-03-12 MED ORDER — GUAIFENESIN ER 600 MG PO TB12
600.0000 mg | ORAL_TABLET | Freq: Two times a day (BID) | ORAL | Status: DC | PRN
  Administered 2022-03-12: 600 mg via ORAL
  Filled 2022-03-12: qty 1

## 2022-03-12 MED ORDER — SACUBITRIL-VALSARTAN 97-103 MG PO TABS
1.0000 | ORAL_TABLET | Freq: Two times a day (BID) | ORAL | Status: DC
  Administered 2022-03-13: 1 via ORAL
  Filled 2022-03-12 (×4): qty 1

## 2022-03-12 MED ORDER — ROSUVASTATIN CALCIUM 10 MG PO TABS
10.0000 mg | ORAL_TABLET | Freq: Every day | ORAL | Status: DC
  Administered 2022-03-13: 10 mg via ORAL
  Filled 2022-03-12 (×2): qty 1

## 2022-03-12 MED ORDER — IPRATROPIUM-ALBUTEROL 0.5-2.5 (3) MG/3ML IN SOLN
3.0000 mL | Freq: Four times a day (QID) | RESPIRATORY_TRACT | Status: DC | PRN
  Administered 2022-03-12: 3 mL via RESPIRATORY_TRACT
  Filled 2022-03-12: qty 3

## 2022-03-12 MED ORDER — ALBUTEROL SULFATE (2.5 MG/3ML) 0.083% IN NEBU: 2.5000 mg | INHALATION_SOLUTION | Freq: Four times a day (QID) | RESPIRATORY_TRACT | Status: DC | PRN

## 2022-03-12 MED ORDER — PRAZOSIN HCL 1 MG PO CAPS
2.0000 mg | ORAL_CAPSULE | Freq: Every day | ORAL | Status: DC
  Filled 2022-03-12 (×2): qty 2

## 2022-03-12 MED ORDER — FLUTICASONE PROPIONATE 50 MCG/ACT NA SUSP: 2.0000 | Freq: Every day | NASAL | Status: DC | PRN

## 2022-03-12 MED ORDER — IPRATROPIUM-ALBUTEROL 0.5-2.5 (3) MG/3ML IN SOLN
3.0000 mL | Freq: Four times a day (QID) | RESPIRATORY_TRACT | Status: DC
  Administered 2022-03-12 – 2022-03-13 (×4): 3 mL via RESPIRATORY_TRACT
  Filled 2022-03-12 (×5): qty 3

## 2022-03-12 MED ORDER — ENOXAPARIN SODIUM 40 MG/0.4ML IJ SOSY
40.0000 mg | PREFILLED_SYRINGE | INTRAMUSCULAR | Status: DC
  Administered 2022-03-13: 40 mg via SUBCUTANEOUS
  Filled 2022-03-12 (×2): qty 0.4

## 2022-03-12 MED ORDER — ATORVASTATIN CALCIUM 40 MG PO TABS
80.0000 mg | ORAL_TABLET | Freq: Every day | ORAL | Status: DC
  Administered 2022-03-13: 80 mg via ORAL
  Filled 2022-03-12 (×2): qty 2

## 2022-03-12 MED ORDER — MIRTAZAPINE 15 MG PO TABS: 30.0000 mg | ORAL_TABLET | Freq: Every day | ORAL | Status: DC

## 2022-03-12 MED ORDER — DICLOFENAC SODIUM 1 % EX GEL: 2.0000 g | Freq: Every day | CUTANEOUS | Status: DC | PRN

## 2022-03-12 MED ORDER — SODIUM CHLORIDE 0.9 % IV SOLN
2.0000 g | Freq: Two times a day (BID) | INTRAVENOUS | Status: DC
  Administered 2022-03-12 – 2022-03-13 (×3): 2 g via INTRAVENOUS
  Filled 2022-03-12 (×3): qty 12.5

## 2022-03-12 MED ORDER — ACETAMINOPHEN 500 MG PO TABS: 1000.0000 mg | ORAL_TABLET | Freq: Four times a day (QID) | ORAL | Status: DC | PRN

## 2022-03-12 MED ORDER — METOPROLOL SUCCINATE ER 25 MG PO TB24
25.0000 mg | ORAL_TABLET | Freq: Every day | ORAL | Status: DC
  Administered 2022-03-13: 25 mg via ORAL
  Filled 2022-03-12 (×2): qty 1

## 2022-03-12 MED ORDER — METHYLPREDNISOLONE SODIUM SUCC 125 MG IJ SOLR
125.0000 mg | Freq: Two times a day (BID) | INTRAMUSCULAR | Status: AC
  Administered 2022-03-12 (×2): 125 mg via INTRAVENOUS
  Filled 2022-03-12 (×2): qty 2

## 2022-03-12 MED ORDER — MOMETASONE FURO-FORMOTEROL FUM 200-5 MCG/ACT IN AERO
2.0000 | INHALATION_SPRAY | Freq: Two times a day (BID) | RESPIRATORY_TRACT | Status: DC
  Administered 2022-03-12 – 2022-03-13 (×4): 2 via RESPIRATORY_TRACT
  Filled 2022-03-12: qty 8.8

## 2022-03-12 MED ORDER — IPRATROPIUM BROMIDE 0.03 % NA SOLN: 1.0000 | Freq: Three times a day (TID) | NASAL | Status: DC | PRN

## 2022-03-12 MED ORDER — PRAZOSIN HCL 5 MG PO CAPS
5.0000 mg | ORAL_CAPSULE | Freq: Every day | ORAL | Status: DC
  Filled 2022-03-12 (×2): qty 1

## 2022-03-12 NOTE — Progress Notes (Signed)
PROGRESS NOTE    Gerald Williams  WUJ:[id number] DOB: August 03, 1950 DOA: 03/11/2022 PCP: [facility]    Brief Narrative:  71 year old with history of COPD, chronic hypoxemic failure on 4 to 5 L oxygen at home, chronic systolic heart failure, hypertension, hyperlipidemia with history of smoking and cocaine use, obstructive sleep apnea who was recently diagnosed with COVID-19 about 10 days ago, initially recovered but again started having significant shortness of breath, cough.  Brought to ER.  Initially on 12 L oxygen by nasal cannula and DuoNebs given.  Started on BiPAP and admitted to the hospital for acute on chronic hypoxemic respiratory failure.  Repeat COVID-19 test positive.  Chest x-ray normal.   Assessment & Plan:   Acute on chronic hypoxemic respiratory failure, respiratory distress as evidenced by increasing oxygen need more than 12 L on arrival and needing BiPAP support system.  COPD exacerbation due to recent viral infection.  Agree with admission to monitored unit because of severity of symptoms. Aggressive bronchodilator therapy, IV steroids to oral steroids, inhalational steroids , scheduled and as needed bronchodilators, deep breathing exercises, incentive spirometry, chest physiotherapy. Antibiotics due to severity of symptoms.  Started on cefepime, will continue today.  Will check procalcitonin with morning labs. Supplemental oxygen to keep saturations more than 92%.  BiPAP or CPAP at night and as needed for respiratory distress. Patient is more than 10 days from COVID-19 diagnosis, currently no indication for any antiviral therapy. Patient will likely need increasing oxygen supplies at home.  We will check ambulatory oxygen.  His oxygen orders needs to go through [place].  Chronic medical issues including Essential hypertension, stable Hyperlipidemia, stable on Lipitor Obstructive sleep apnea, as above normally on CPAP at home, currently BiPAP.  Will transition to  CPAP when breathing is stabilized.   DVT prophylaxis: enoxaparin (LOVENOX) injection 40 mg Start: 03/12/22 1000   Code Status: Full code Family Communication: None.  Patient tells me he will communicate. Disposition Plan: Status is: Inpatient Remains inpatient appropriate because: Respiratory distress, COPD exacerbation     Consultants:  None  Procedures:  None  Antimicrobials:  Cefepime 11/17---   Subjective: Patient was seen and examined.  We was still on BiPAP.  He was comfortable and talkative through the BiPAP.  No other overnight events.  Patient tells me that 4 L oxygen not enough and he will need a note to say that he needs 8 to 10 L of oxygen at home.  I told him we will check his oxygen level today and send new requisition to [place]. Patient was wondering whether he is going home today as he feels better but he asked [place] to put him back on BiPAP after breakfast.  Objective: Vitals:   03/11/22 2130 03/12/22 0037 03/12/22 0256 03/12/22 0451  BP: 125/87 (!) 155/105  (!) 144/90  Pulse: 87 83  73  Resp: (!) 23   20  Temp:  97.7 F (36.5 C)  98 F (36.7 C)  TempSrc:  Oral    SpO2: 99% 100% 98% 100%   No intake or output data in the 24 hours ending 03/12/22 0727 There were no vitals filed for this visit.  Examination:  General exam: Appears calm and comfortable, able to talk in full sentences through the BiPAP. Respiratory system: Clear to auscultation. Respiratory effort normal.  Mostly clear. Cardiovascular system: S1 & S2 heard, RRR. No pedal edema. Gastrointestinal system: Abdomen is nondistended, soft and nontender. No organomegaly or masses felt. Normal bowel sounds heard. Central  nervous system: Alert and oriented. No focal neurological deficits. Extremities: Symmetric 5 x 5 power. Skin: No rashes, lesions or ulcers Psychiatry: Judgement and insight appear normal. Mood & affect appropriate.     Data Reviewed: I have personally reviewed following labs and  imaging studies  CBC: Recent Labs  Lab 03/11/22 1939 03/12/22 0453  WBC 9.1 6.2  HGB 12.1* 10.3*  HCT 38.9* 32.9*  MCV 94.6 94.3  PLT 318 248   Basic Metabolic Panel: Recent Labs  Lab 03/11/22 1939  NA 141  K 4.0  CL 103  CO2 29  GLUCOSE 217*  BUN 26*  CREATININE 1.34*  CALCIUM 9.5   GFR: Estimated Creatinine Clearance: 50.7 mL/min (A) (by C-G formula based on SCr of 1.34 mg/dL (H)). Liver Function Tests: No results for input(s): "AST", "ALT", "ALKPHOS", "BILITOT", "PROT", "ALBUMIN" in the last 168 hours. No results for input(s): "LIPASE", "AMYLASE" in the last 168 hours. No results for input(s): "AMMONIA" in the last 168 hours. Coagulation Profile: No results for input(s): "INR", "PROTIME" in the last 168 hours. Cardiac Enzymes: No results for input(s): "CKTOTAL", "CKMB", "CKMBINDEX", "TROPONINI" in the last 168 hours. BNP (last 3 results) No results for input(s): "PROBNP" in the last 8760 hours. HbA1C: No results for input(s): "HGBA1C" in the last 72 hours. CBG: No results for input(s): "GLUCAP" in the last 168 hours. Lipid Profile: No results for input(s): "CHOL", "HDL", "LDLCALC", "TRIG", "CHOLHDL", "LDLDIRECT" in the last 72 hours. Thyroid Function Tests: No results for input(s): "TSH", "T4TOTAL", "FREET4", "T3FREE", "THYROIDAB" in the last 72 hours. Anemia Panel: No results for input(s): "VITAMINB12", "FOLATE", "FERRITIN", "TIBC", "IRON", "RETICCTPCT" in the last 72 hours. Sepsis Labs: No results for input(s): "PROCALCITON", "LATICACIDVEN" in the last 168 hours.  No results found for this or any previous visit (from the past 240 hour(s)).       Radiology Studies: DG Chest Port 1 View  Result Date: 03/11/2022 CLINICAL DATA:  Difficulty breathing, shortness of breath EXAM: PORTABLE CHEST 1 VIEW COMPARISON:  02/26/2022 FINDINGS: Linear atelectasis in the left mid lung. Right lung clear. Heart is normal size. No effusions or acute bony abnormality.  IMPRESSION: No active disease. Electronically Signed   By: Charlett Nose M.D.   On: 03/11/2022 21:12        Scheduled Meds:  atorvastatin  80 mg Oral Daily   DULoxetine  60 mg Oral BID   enoxaparin (LOVENOX) injection  40 mg Subcutaneous Q24H   losartan  50 mg Oral Daily   methylPREDNISolone (SOLU-MEDROL) injection  125 mg Intravenous Q12H   Followed by   Melene Muller ON 03/13/2022] predniSONE  40 mg Oral Q breakfast   metoprolol succinate  25 mg Oral Daily   mirtazapine  30 mg Oral QHS   mometasone-formoterol  2 puff Inhalation BID   prazosin  2 mg Oral QHS   prazosin  5 mg Oral QHS   rosuvastatin  10 mg Oral Daily   sacubitril-valsartan  1 tablet Oral BID   Continuous Infusions:  ceFEPime (MAXIPIME) IV 2 g (03/12/22 0212)     LOS: 1 day    Time spent: 35 minutes    Dorcas Carrow, MD [facility] Pager [phone]

## 2022-03-12 NOTE — Progress Notes (Signed)
Mobility Specialist - Progress Note   03/12/22 1505  Oxygen Therapy  O2 Device Nasal Cannula  O2 Flow Rate (L/min) 6 L/min  Mobility  Activity Ambulated independently in hallway  Level of Assistance Independent  Assistive Device None  Distance Ambulated (ft) 500 ft  Activity Response Tolerated well  Mobility Referral Yes  $Mobility charge 1 Mobility   Pt received in bed and agreeable to mobility. No complaints during mobility. Pt to bed after session with all needs met.    Pre-mobility: 94bpm, HR, 95% SpO2 During- mobility: 94% SpO2  98% SPO2  Set designer

## 2022-03-12 NOTE — Evaluation (Signed)
Physical Therapy Evaluation Patient Details Name: Gerald Williams MRN: [id number] DOB: 04-30-1950 Today's Date: 03/12/2022  History of Present Illness  71 y.o. male with medical history significant of COPD, chronic respiratory failure on 4 to 5 L of oxygen at home, systolic dysfunction CHF, hyperlipidemia, essential hypertension, prior history of tobacco and cocaine abuse, obstructive sleep apnea, who was diagnosed with COVID-19 apparently about 10-15 days ago.  He has been recovering doing better but today he woke up with significant worsening of his shortness of breath, cough, hypoxia. Dx of respiratory failure, COPD exacerbation.  Clinical Impression  Pt admitted with above diagnosis. Pt fatigued from just having ambulated 500' in the hallway with mobility specialist. At rest SaO2 100% on 5L O2. BUE/LE strength is WNL. Pt ambulates with a straight cane PRN, drives, and is independent with ADLs at baseline. Mobility deferred 2* fatigue from recent walk. Will assess mobility next session. Pt currently with functional limitations due to the deficits listed below (see PT Problem List). Pt will benefit from skilled PT to increase their independence and safety with mobility to allow discharge to the venue listed below.          Recommendations for follow up therapy are one component of a multi-disciplinary discharge planning process, led by the attending physician.  Recommendations may be updated based on patient status, additional functional criteria and insurance authorization.  Follow Up Recommendations Home health PT      Assistance Recommended at Discharge PRN  Patient can return home with the following  Assistance with cooking/housework;Help with stairs or ramp for entrance    Equipment Recommendations None recommended by PT  Recommendations for Other Services       Functional Status Assessment Patient has had a recent decline in their functional status and demonstrates the ability to  make significant improvements in function in a reasonable and predictable amount of time.     Precautions / Restrictions Precautions Precaution Comments: monitor O2 Restrictions Weight Bearing Restrictions: No      Mobility  Bed Mobility               General bed mobility comments: deferred, pt just walked long distance in hallway with mobility specialist and is fatigued    Transfers                        Ambulation/Gait                  Stairs            Wheelchair Mobility    Modified Rankin (Stroke Patients Only)       Balance                                             Pertinent Vitals/Pain Pain Assessment Pain Assessment: No/denies pain    Home Living Family/patient expects to be discharged to:: Private residence Living Arrangements: Alone Available Help at Discharge: Family;Available PRN/intermittently Type of Home: Apartment Home Access: Stairs to enter Entrance Stairs-Rails: Left;Right;Can reach both Entrance Stairs-Number of Steps: 3   Home Layout: One level Home Equipment: Cane - single point Additional Comments: nephew lives 4 doors down    Prior Function Prior Level of Function : Independent/Modified Independent;Driving             Mobility Comments: uses cane prn when he feels "weak  in the legs", denies falls in past 6 months, drives, able to get own groceries ADLs Comments: independent     Hand Dominance        Extremity/Trunk Assessment   Upper Extremity Assessment Upper Extremity Assessment: Overall WFL for tasks assessed    Lower Extremity Assessment Lower Extremity Assessment: Overall WFL for tasks assessed    Cervical / Trunk Assessment Cervical / Trunk Assessment: Normal  Communication   Communication: No difficulties  Cognition Arousal/Alertness: Awake/alert Behavior During Therapy: WFL for tasks assessed/performed Overall Cognitive Status: Within Functional Limits  for tasks assessed                                          General Comments General comments (skin integrity, edema, etc.): SaO2 100% on 5L O2 at rest    Exercises     Assessment/Plan    PT Assessment Patient needs continued PT services  PT Problem List Cardiopulmonary status limiting activity;Decreased activity tolerance       PT Treatment Interventions Therapeutic activities;Gait training;Therapeutic exercise;Functional mobility training    PT Goals (Current goals can be found in the Care Plan section)  Acute Rehab PT Goals Patient Stated Goal: improve breathing PT Goal Formulation: With patient Time For Goal Achievement: 03/26/22 Potential to Achieve Goals: Good    Frequency Min 3X/week     Co-evaluation               AM-PAC PT "6 Clicks" Mobility  Outcome Measure Help needed turning from your back to your side while in a flat bed without using bedrails?: A Little Help needed moving from lying on your back to sitting on the side of a flat bed without using bedrails?: A Little Help needed moving to and from a bed to a chair (including a wheelchair)?: A Little Help needed standing up from a chair using your arms (e.g., wheelchair or bedside chair)?: A Little Help needed to walk in hospital room?: A Little Help needed climbing 3-5 steps with a railing? : A Little 6 Click Score: 18    End of Session Equipment Utilized During Treatment: Oxygen Activity Tolerance: Patient tolerated treatment well Patient left: in bed;with call bell/phone within reach;with bed alarm set Nurse Communication: Mobility status PT Visit Diagnosis: Difficulty in walking, not elsewhere classified (R26.2)    Time: 8882-8003 PT Time Calculation (min) (ACUTE ONLY): 14 min   Charges:   PT Evaluation $PT Eval Low Complexity: 1 Low         Ralene Bathe Kistler PT 03/12/2022  Acute Rehabilitation Services  Office [phone]

## 2022-03-13 DIAGNOSIS — J9621 Acute and chronic respiratory failure with hypoxia: Secondary | ICD-10-CM | POA: Diagnosis not present

## 2022-03-13 LAB — CBC WITH DIFFERENTIAL/PLATELET
Abs Immature Granulocytes: 0.03 10*3/uL (ref 0.00–0.07)
Basophils Absolute: 0 10*3/uL (ref 0.0–0.1)
Basophils Relative: 0 %
Eosinophils Absolute: 0 10*3/uL (ref 0.0–0.5)
Eosinophils Relative: 0 %
HCT: 35 % — ABNORMAL LOW (ref 39.0–52.0)
Hemoglobin: 10.9 g/dL — ABNORMAL LOW (ref 13.0–17.0)
Immature Granulocytes: 0 %
Lymphocytes Relative: 7 %
Lymphs Abs: 0.6 10*3/uL — ABNORMAL LOW (ref 0.7–4.0)
MCH: 29.1 pg (ref 26.0–34.0)
MCHC: 31.1 g/dL (ref 30.0–36.0)
MCV: 93.6 fL (ref 80.0–100.0)
Monocytes Absolute: 0.4 10*3/uL (ref 0.1–1.0)
Monocytes Relative: 5 %
Neutro Abs: 7.2 10*3/uL (ref 1.7–7.7)
Neutrophils Relative %: 88 %
Platelets: 265 10*3/uL (ref 150–400)
RBC: 3.74 MIL/uL — ABNORMAL LOW (ref 4.22–5.81)
RDW: 14 % (ref 11.5–15.5)
WBC: 8.2 10*3/uL (ref 4.0–10.5)
nRBC: 0 % (ref 0.0–0.2)

## 2022-03-13 LAB — PROCALCITONIN: Procalcitonin: <0.1 ng/mL

## 2022-03-13 LAB — PHOSPHORUS: Phosphorus: 4.3 mg/dL (ref 2.5–4.6)

## 2022-03-13 LAB — MAGNESIUM: Magnesium: 2.5 mg/dL — ABNORMAL HIGH (ref 1.7–2.4)

## 2022-03-13 MED ORDER — PREDNISONE 10 MG PO TABS: 10.0000 mg | ORAL_TABLET | Freq: Every day | ORAL | 0 refills | Status: AC

## 2022-03-13 MED ORDER — GUAIFENESIN ER 600 MG PO TB12: 600.0000 mg | ORAL_TABLET | Freq: Two times a day (BID) | ORAL | 0 refills | Status: AC | PRN

## 2022-03-13 MED ORDER — HYDROCOD POLI-CHLORPHE POLI ER 10-8 MG/5ML PO SUER: 5.0000 mL | Freq: Two times a day (BID) | ORAL | 0 refills | Status: AC

## 2022-03-13 MED ORDER — POLYETHYLENE GLYCOL 3350 17 G PO PACK: 17.0000 g | PACK | Freq: Every day | ORAL | 0 refills | Status: DC

## 2022-03-13 MED ORDER — AZITHROMYCIN 500 MG PO TABS: 500.0000 mg | ORAL_TABLET | Freq: Every day | ORAL | 0 refills | Status: AC

## 2022-03-13 NOTE — Discharge Summary (Addendum)
Physician Discharge Summary  Gerald Williams CLE:[id number] DOB: 11-06-1950 DOA: 03/11/2022  PCP: [facility]  Admit date: 03/11/2022 Discharge date: 03/13/2022  Admitted From: Home Disposition:  Home  Discharge Condition:Stable CODE STATUS:FULL Diet recommendation: Heart Healthy  Brief/Interim Summary:  71 year old with history of COPD, chronic hypoxemic failure on 4 to 5 L oxygen at home, chronic systolic heart failure, hypertension, hyperlipidemia with history of smoking and cocaine use, obstructive sleep apnea who was recently diagnosed with COVID-19 about 10 days ago, initially recovered but again started having significant shortness of breath, cough.  Brought to ER.  Initially on 12 L oxygen by nasal cannula and DuoNebs given.  Started on BiPAP and admitted to the hospital for acute on chronic hypoxemic respiratory failure.  Repeat COVID-19 test positive.  Chest x-ray normal.  He was treated with increased oxygen supplement, steroids.  Currently saturating fine on 5 L of oxygen per minute.  Physical therapy saw him and recommend home health.  Medically stable for discharge.  Patient is eager to go home today.  Following problems were addressed during his hospitalization:  Acute on chronic hypoxemic respiratory failure: On 4 L of oxygen at home.  Respiratory distress on presentation as evidenced by increasing oxygen need more than 12 L on arrival and needing BiPAP support system.  COPD exacerbation due to recent viral infection. Started on bronchodilator therapy, IV steroids to oral steroids, inhalational steroids , scheduled and as needed bronchodilators, deep breathing exercises, incentive spirometry, chest physiotherapy. Antibiotics started  due to severity of symptoms .  Procalcitonin negative.  Antibiotics changed to azithromycin for 3 more days. Patient is more than 10 days from COVID-19 diagnosis, currently no indication for any antiviral therapy. He qualified for home  oxygen for 5 days. We will put him on tapering dose of  prednisone.  Essential hypertension: stable  Hyperlipidemia:stable on Lipitor  Obstructive sleep apnea:normally on CPAP at home.  Systolic CHF: Currently euvolemic.  Last echo on 05/2021 showed EF of 45 to 50%.  On metoprolol, Entresto at home.  We recommend to follow-up with cardiology as an outpatient.   Discharge Diagnoses:  Principal Problem:   Acute on chronic hypoxic respiratory failure (HCC) Active Problems:   Chest pain   OSA on CPAP   COPD (chronic obstructive pulmonary disease) with emphysema (HCC)   PTSD (post-traumatic stress disorder)   Dyslipidemia   HTN (hypertension)   Chronic respiratory failure with hypoxia (HCC)   COVID-19 virus infection    Discharge Instructions  Discharge Instructions     Diet - low sodium heart healthy   Complete by: As directed    Discharge instructions   Complete by: As directed    1)Please take prescribed medications as instructed 2)Follow up with home health. 3)Follow up with your PCP in a week.  Follow-up with your pulmonologist as an outpatient.   Increase activity slowly   Complete by: As directed       Allergies as of 03/13/2022       Reactions   Tiotropium Cough   Other reaction(s): Dysphagia,   Gabapentin    Other reaction(s): Dizziness   Nortriptyline    Other reaction(s): Drowsy   Budeson-glycopyrrol-formoterol Other (See Comments)   Other reaction(s): Retention of urine, Constipation, Retention of urine, Constipation   Olodaterol Hives, Rash   Oxcarbazepine Nausea And Vomiting   Tiotropium Bromide-olodaterol Itching        Medication List     STOP taking these medications    losartan 50 MG  tablet Commonly known as: COZAAR       TAKE these medications    acetaminophen 500 MG tablet Commonly known as: TYLENOL Take 1,000 mg by mouth every 6 (six) hours as needed for mild pain, fever or headache.   albuterol (2.5 MG/3ML) 0.083% nebulizer  solution Commonly known as: PROVENTIL Take 2.5 mg by nebulization every 6 (six) hours as needed for wheezing or shortness of breath.   atorvastatin 80 MG tablet Commonly known as: LIPITOR Take 80 mg by mouth daily.   azithromycin 500 MG tablet Commonly known as: Zithromax Take 1 tablet (500 mg total) by mouth daily for 3 days. Take 1 tablet daily for 3 days.   chlorpheniramine-HYDROcodone 10-8 MG/5ML Commonly known as: TUSSIONEX Take 5 mLs by mouth 2 (two) times daily for 7 days.   Combivent Respimat 20-100 MCG/ACT Aers respimat Generic drug: Ipratropium-Albuterol Inhale 1 puff into the lungs every 6 (six) hours. What changed:  when to take this reasons to take this   diclofenac Sodium 1 % Gel Commonly known as: VOLTAREN Apply 2 g topically 4 (four) times daily. What changed:  when to take this reasons to take this   DULoxetine 60 MG capsule Commonly known as: CYMBALTA Take 60 mg by mouth 2 (two) times daily.   fluticasone 50 MCG/ACT nasal spray Commonly known as: FLONASE Place 2 sprays into both nostrils daily as needed for allergies.   furosemide 20 MG tablet Commonly known as: Lasix Take 1 tablet (20 mg total) by mouth daily.   glucose blood test strip Commonly known as: Cool Blood Glucose Test Strips Used to test blood sugar x2 daily---diagnosis code r73.03--for one touch verio flex   guaiFENesin 600 MG 12 hr tablet Commonly known as: MUCINEX Take 1 tablet (600 mg total) by mouth 2 (two) times daily as needed for up to 7 days for to loosen phlegm.   ipratropium 0.03 % nasal spray Commonly known as: ATROVENT Place 1 spray into both nostrils every 8 (eight) hours as needed for rhinitis.   Lancets Misc Used to test blood sugar x2 daily---diagnosis code r73.03--for one touch verio flex   metoprolol succinate 50 MG 24 hr tablet Commonly known as: TOPROL-XL Take 1 tablet (50 mg total) by mouth daily. Take with or immediately following a meal. What changed:   how much to take when to take this   mirtazapine 30 MG tablet Commonly known as: REMERON Take 30 mg by mouth at bedtime.   OXYGEN Inhale 4-5 L into the lungs continuous.   polyethylene glycol 17 g packet Commonly known as: MiraLax Take 17 g by mouth daily.   prazosin 2 MG capsule Commonly known as: MINIPRESS Take 2 mg by mouth at bedtime.   prazosin 5 MG capsule Commonly known as: MINIPRESS Take 1 capsule by mouth at bedtime.   predniSONE 10 MG tablet Commonly known as: DELTASONE Take 1 tablet (10 mg total) by mouth daily with breakfast. Take 4 pills for 3 days and then 3 pills for 3 days then 2 pills for 3 days then 1 pill for 3 days then stop   rosuvastatin 10 MG tablet Commonly known as: CRESTOR Take 10 mg by mouth daily.   sacubitril-valsartan 97-103 MG Commonly known as: ENTRESTO Take 1 tablet by mouth 2 (two) times daily.   Symbicort 160-4.5 MCG/ACT inhaler Generic drug: budesonide-formoterol Inhale 2 puffs into the lungs in the morning and at bedtime.  Durable Medical Equipment  (From admission, onward)           Start     Ordered   03/13/22 0847  For home use only DME oxygen  Once       Question Answer Comment  Length of Need Lifetime   Liters per Minute 5   Frequency Continuous (stationary and portable oxygen unit needed)   Oxygen delivery system Gas      03/13/22 0846            Follow-up Information     [facility]. Schedule an appointment as soon as possible for a visit in 1 week(s).   Specialty: General Practice Contact information: [address] [place] [address] [phone]                Allergies  Allergen Reactions   Tiotropium Cough    Other reaction(s): Dysphagia,   Gabapentin     Other reaction(s): Dizziness   Nortriptyline     Other reaction(s): Drowsy   Budeson-Glycopyrrol-Formoterol Other (See Comments)    Other reaction(s): Retention of urine, Constipation, Retention of  urine, Constipation   Olodaterol Hives and Rash   Oxcarbazepine Nausea And Vomiting   Tiotropium Bromide-Olodaterol Itching    Consultations: None   Procedures/Studies: DG Chest Port 1 View  Result Date: 03/11/2022 CLINICAL DATA:  Difficulty breathing, shortness of breath EXAM: PORTABLE CHEST 1 VIEW COMPARISON:  02/26/2022 FINDINGS: Linear atelectasis in the left mid lung. Right lung clear. Heart is normal size. No effusions or acute bony abnormality. IMPRESSION: No active disease. Electronically Signed   By: Charlett Nose M.D.   On: 03/11/2022 21:12   DG Chest Port 1 View  Result Date: 02/26/2022 CLINICAL DATA:  Fever, cough EXAM: PORTABLE CHEST 1 VIEW COMPARISON:  CT chest dated 05/25/2021 FINDINGS: Platelike scarring in the lingula. No focal consolidation. No pleural effusion or pneumothorax. The heart is normal in size. IMPRESSION: No evidence of acute cardiopulmonary disease. Electronically Signed   By: Charline Bills M.D.   On: 02/26/2022 20:26      Subjective: Patient seen and examined at bedside today.  Hemodynamically stable.  Eager to go home today.  On 5% oxygen per minute  Discharge Exam: Vitals:   03/13/22 0527 03/13/22 0813  BP: (!) 142/90   Pulse: 70   Resp: 18   Temp: 97.9 F (36.6 C)   SpO2: 100% 100%   Vitals:   03/12/22 2248 03/13/22 0212 03/13/22 0527 03/13/22 0813  BP:   (!) 142/90   Pulse: 76 64 70   Resp: 18 18 18    Temp:   97.9 F (36.6 C)   TempSrc:   Oral   SpO2: 98% 97% 100% 100%    General: Pt is alert, awake, not in acute distress Cardiovascular: RRR, S1/S2 +, no rubs, no gallops Respiratory: Mild diminished air sounds bilaterally, mild expiratory wheezing  abdominal: Soft, NT, ND, bowel sounds + Extremities: no edema, no cyanosis    The results of significant diagnostics from this hospitalization (including imaging, microbiology, ancillary and laboratory) are listed below for reference.     Microbiology: No results found for  this or any previous visit (from the past 240 hour(s)).   Labs: BNP (last 3 results) Recent Labs    02/26/22 1930 03/11/22 1939  BNP 32.3 149.0*   Basic Metabolic Panel: Recent Labs  Lab 03/11/22 1939 03/12/22 0453 03/13/22 0421  NA 141 141  --   K 4.0 4.4  --  CL 103 108  --   CO2 29 29  --   GLUCOSE 217* 160*  --   BUN 26* 25*  --   CREATININE 1.34* 1.14  --   CALCIUM 9.5 9.0  --   MG  --   --  2.5*  PHOS  --   --  4.3   Liver Function Tests: Recent Labs  Lab 03/12/22 0453  AST 26  ALT 16  ALKPHOS 58  BILITOT 0.6  PROT 7.1  ALBUMIN 3.6   No results for input(s): "LIPASE", "AMYLASE" in the last 168 hours. No results for input(s): "AMMONIA" in the last 168 hours. CBC: Recent Labs  Lab 03/11/22 1939 03/12/22 0453 03/13/22 0421  WBC 9.1 6.2 8.2  NEUTROABS  --   --  7.2  HGB 12.1* 10.3* 10.9*  HCT 38.9* 32.9* 35.0*  MCV 94.6 94.3 93.6  PLT 318 248 265   Cardiac Enzymes: No results for input(s): "CKTOTAL", "CKMB", "CKMBINDEX", "TROPONINI" in the last 168 hours. BNP: Invalid input(s): "POCBNP" CBG: No results for input(s): "GLUCAP" in the last 168 hours. D-Dimer Recent Labs    03/12/22 0924  DDIMER 0.59*   Hgb A1c No results for input(s): "HGBA1C" in the last 72 hours. Lipid Profile No results for input(s): "CHOL", "HDL", "LDLCALC", "TRIG", "CHOLHDL", "LDLDIRECT" in the last 72 hours. Thyroid function studies No results for input(s): "TSH", "T4TOTAL", "T3FREE", "THYROIDAB" in the last 72 hours.  Invalid input(s): "FREET3" Anemia work up No results for input(s): "VITAMINB12", "FOLATE", "FERRITIN", "TIBC", "IRON", "RETICCTPCT" in the last 72 hours. Urinalysis    Component Value Date/Time   COLORURINE YELLOW 05/19/2020 1008   APPEARANCEUR CLEAR 05/19/2020 1008   LABSPEC 1.030 05/19/2020 1008   PHURINE 6.0 05/19/2020 1008   GLUCOSEU NEGATIVE 05/19/2020 1008   HGBUR NEGATIVE 05/19/2020 1008   BILIRUBINUR NEGATIVE 05/19/2020 1008   KETONESUR  NEGATIVE 05/19/2020 1008   PROTEINUR NEGATIVE 05/19/2020 1008   UROBILINOGEN 0.2 01/06/2013 1204   NITRITE NEGATIVE 05/19/2020 1008   LEUKOCYTESUR NEGATIVE 05/19/2020 1008   Sepsis Labs Recent Labs  Lab 03/11/22 1939 03/12/22 0453 03/13/22 0421  WBC 9.1 6.2 8.2   Microbiology No results found for this or any previous visit (from the past 240 hour(s)).  Please note: You were cared for by a hospitalist during your hospital stay. Once you are discharged, your primary care physician will handle any further medical issues. Please note that NO REFILLS for any discharge medications will be authorized once you are discharged, as it is imperative that you return to your primary care physician (or establish a relationship with a primary care physician if you do not have one) for your post hospital discharge needs so that they can reassess your need for medications and monitor your lab values.    Time coordinating discharge: 40 minutes  SIGNED:   Burnadette Pop, MD  [facility] 03/13/2022, 10:10 AM Pager [phone]  If 7PM-7AM, please contact night-coverage [email or web address] Password [email or web address]

## 2022-03-13 NOTE — Progress Notes (Signed)
Nsg Discharge Note  Admit Date:  03/11/2022 Discharge date: 03/13/2022   [name] to be D/C'd Home per MD order.  AVS completed.  Patient/caregiver able to verbalize understanding.  Discharge Medication: Allergies as of 03/13/2022       Reactions   Tiotropium Cough   Other reaction(s): Dysphagia,   Gabapentin    Other reaction(s): Dizziness   Nortriptyline    Other reaction(s): Drowsy   Budeson-glycopyrrol-formoterol Other (See Comments)   Other reaction(s): Retention of urine, Constipation, Retention of urine, Constipation   Olodaterol Hives, Rash   Oxcarbazepine Nausea And Vomiting   Tiotropium Bromide-olodaterol Itching        Medication List     STOP taking these medications    losartan 50 MG tablet Commonly known as: COZAAR       TAKE these medications    acetaminophen 500 MG tablet Commonly known as: TYLENOL Take 1,000 mg by mouth every 6 (six) hours as needed for mild pain, fever or headache.   albuterol (2.5 MG/3ML) 0.083% nebulizer solution Commonly known as: PROVENTIL Take 2.5 mg by nebulization every 6 (six) hours as needed for wheezing or shortness of breath.   atorvastatin 80 MG tablet Commonly known as: LIPITOR Take 80 mg by mouth daily.   azithromycin 500 MG tablet Commonly known as: Zithromax Take 1 tablet (500 mg total) by mouth daily for 3 days. Take 1 tablet daily for 3 days.   chlorpheniramine-HYDROcodone 10-8 MG/5ML Commonly known as: TUSSIONEX Take 5 mLs by mouth 2 (two) times daily for 7 days.   Combivent Respimat 20-100 MCG/ACT Aers respimat Generic drug: Ipratropium-Albuterol Inhale 1 puff into the lungs every 6 (six) hours. What changed:  when to take this reasons to take this   diclofenac Sodium 1 % Gel Commonly known as: VOLTAREN Apply 2 g topically 4 (four) times daily. What changed:  when to take this reasons to take this   DULoxetine 60 MG capsule Commonly known as: CYMBALTA Take 60 mg by mouth 2 (two)  times daily.   fluticasone 50 MCG/ACT nasal spray Commonly known as: FLONASE Place 2 sprays into both nostrils daily as needed for allergies.   furosemide 20 MG tablet Commonly known as: Lasix Take 1 tablet (20 mg total) by mouth daily.   glucose blood test strip Commonly known as: Cool Blood Glucose Test Strips Used to test blood sugar x2 daily---diagnosis code r73.03--for one touch verio flex   guaiFENesin 600 MG 12 hr tablet Commonly known as: MUCINEX Take 1 tablet (600 mg total) by mouth 2 (two) times daily as needed for up to 7 days for to loosen phlegm.   ipratropium 0.03 % nasal spray Commonly known as: ATROVENT Place 1 spray into both nostrils every 8 (eight) hours as needed for rhinitis.   Lancets Misc Used to test blood sugar x2 daily---diagnosis code r73.03--for one touch verio flex   metoprolol succinate 50 MG 24 hr tablet Commonly known as: TOPROL-XL Take 1 tablet (50 mg total) by mouth daily. Take with or immediately following a meal. What changed:  how much to take when to take this   mirtazapine 30 MG tablet Commonly known as: REMERON Take 30 mg by mouth at bedtime.   OXYGEN Inhale 4-5 L into the lungs continuous.   polyethylene glycol 17 g packet Commonly known as: MiraLax Take 17 g by mouth daily.   prazosin 2 MG capsule Commonly known as: MINIPRESS Take 2 mg by mouth at bedtime.   prazosin 5 MG capsule  Commonly known as: MINIPRESS Take 1 capsule by mouth at bedtime.   predniSONE 10 MG tablet Commonly known as: DELTASONE Take 1 tablet (10 mg total) by mouth daily with breakfast. Take 4 pills for 3 days and then 3 pills for 3 days then 2 pills for 3 days then 1 pill for 3 days then stop   rosuvastatin 10 MG tablet Commonly known as: CRESTOR Take 10 mg by mouth daily.   sacubitril-valsartan 97-103 MG Commonly known as: ENTRESTO Take 1 tablet by mouth 2 (two) times daily.   Symbicort 160-4.5 MCG/ACT inhaler Generic drug:  budesonide-formoterol Inhale 2 puffs into the lungs in the morning and at bedtime.               Durable Medical Equipment  (From admission, onward)           Start     Ordered   03/13/22 0847  For home use only DME oxygen  Once       Question Answer Comment  Length of Need Lifetime   Liters per Minute 5   Frequency Continuous (stationary and portable oxygen unit needed)   Oxygen delivery system Gas      03/13/22 0846            Discharge Assessment: Vitals:   03/13/22 1111 03/13/22 1212  BP: (!) 145/94 (!) 156/96  Pulse: 78 68  Resp:  (!) 22  Temp:  98.4 F (36.9 C)  SpO2: 100% 100%   Skin clean, dry and intact without evidence of skin break down, no evidence of skin tears noted. IV catheter discontinued intact. Site without signs and symptoms of complications - no redness or edema noted at insertion site, patient denies c/o pain - only slight tenderness at site.  Dressing with slight pressure applied.  D/c Instructions-Education: Discharge instructions given to patient/family with verbalized understanding. D/c education completed with patient/family including follow up instructions, medication list, d/c activities limitations if indicated, with other d/c instructions as indicated by MD - patient able to verbalize understanding, all questions fully answered. Patient instructed to return to ED, call 911, or call MD for any changes in condition.  Patient escorted via WC, and D/C home via private auto.  Bostyn Kunkler, Tilford Pillar, RN 03/13/2022 2:29 PM

## 2022-03-13 NOTE — TOC Initial Note (Signed)
Transition of Care [facility]) - Initial/Assessment Note    Patient Details  Name: Gerald Williams MRN: [id number] Date of Birth: 16-Dec-1950  Transition of [facility]) CM/SW Contact:    Adrian Prows, RN Phone Number: 03/13/2022, 10:49 AM  Clinical Narrative:                 Spoke with pt in room; the pt is from home and plans to return at d/c; he has transportation home; the pt says he gets his home oxygen from the [place] in [place], [place]; he is not sure of his MD's name; the pt says he has a portable tanik and oxygen concentrator at home; the pt wears glasses but does not receive HH services or other DME; the pt says he does not have dentures or hearing aids; the pt agrees to HHPT and does not have an agency preference; HHPT arranged with [facility], spoke with [facility]; called [facility] for notification of change in oxygen rate [phone] extension [phone] by LaShawn, LVM; contact information listed in followup provider section of d/c instructions; no TOC needs.   Expected Discharge Plan: Home w Home Health Services Barriers to Discharge: Continued Medical Work up   Patient Goals and CMS Choice Patient states their goals for this hospitalization and ongoing recovery are:: home      Expected Discharge Plan and Services Expected Discharge Plan: Home w Home Health Services   Discharge Planning Services: CM Consult   Living arrangements for the past 2 months: Apartment Expected Discharge Date: 03/13/22                         HH Arranged: PT HH Agency: [facility] Date HH Agency Contacted: 03/13/22 Time HH Agency Contacted: 1020 Representative spoke with at [facility] Agency: Mayra Reel  Prior Living Arrangements/Services Living arrangements for the past 2 months: Apartment Lives with:: Self Patient language and need for interpreter reviewed:: Yes Do you feel safe going back to the place where you live?: Yes      Need for Family Participation in Patient Care: Yes  (Comment) Care giver support system in place?: Yes (comment) Current home services: Other (comment) (home oxygen thru [place]) Criminal Activity/Legal Involvement Pertinent to Current Situation/Hospitalization: No - Comment as needed  Activities of Daily Living Home Assistive Devices/Equipment: Oxygen, Eyeglasses ADL Screening (condition at time of admission) Patient's cognitive ability adequate to safely complete daily activities?: Yes Is the patient deaf or have difficulty hearing?: No Does the patient have difficulty seeing, even when wearing glasses/contacts?: No Does the patient have difficulty concentrating, remembering, or making decisions?: Yes Patient able to express need for assistance with ADLs?: Yes Does the patient have difficulty walking or climbing stairs?: Yes (sob) Weakness of Legs: Both Weakness of Arms/Hands: Both  Permission Sought/Granted Permission sought to share information with : Case Manager Permission granted to share information with : Yes, Verbal Permission Granted  Share Information with NAME: Burnard Bunting, RN           Emotional Assessment Appearance:: Appears stated age Attitude/Demeanor/Rapport: Gracious Affect (typically observed): Accepting Orientation: : Oriented to Self, Oriented to Place, Oriented to  Time, Oriented to Situation Alcohol / Substance Use: Not Applicable Psych Involvement: No (comment)  Admission diagnosis:  Acute respiratory failure with hypoxia (HCC) [J96.01] COPD with acute exacerbation (HCC) [J44.1] Acute on chronic hypoxic respiratory failure (HCC) [J96.21] COVID-19 [U07.1] Patient Active Problem List   Diagnosis Date Noted   COVID-19 virus infection 03/11/2022  Acute on chronic hypoxic respiratory failure (HCC) 03/11/2022   Solitary pulmonary nodule on lung CT 04/30/2021   Pleuritic chest pain 11/20/2020   Angina at rest 10/30/2020   Acute systolic heart failure (HCC)    Chronic respiratory failure with hypoxia (HCC)  09/18/2018   Exercise hypoxemia 11/07/2017   Frequent headaches 05/23/2017   Acute pain of right knee 12/23/2016   Old tear of lateral meniscus of right knee 12/23/2016   Blood in stool 10/05/2015   HTN (hypertension) 06/18/2015   Cephalalgia 06/18/2015   Dyslipidemia 06/12/2015   COPD GOLD III 05/28/2015   Peripheral neuropathy 05/20/2015   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 [place] [address] Phone: [phone] Fax: [phone]  [facility] [address] [place] [address] Phone: [phone] Fax: [phone]     Social Determinants of Health (SDOH) Interventions    Readmission Risk Interventions    11/02/2020   12:40 PM  Readmission Risk Prevention Plan  Transportation Screening Complete  PCP or Specialist Appt within 3-5 Days Complete  HRI or Home Care Consult Complete  Social Work Consult for Recovery Care Planning/Counseling Complete  Palliative Care Screening Not Applicable  Medication Review Oceanographer) Complete

## 2022-03-16 ENCOUNTER — Ambulatory Visit: Payer: Non-veteran care | Admitting: Primary Care

## 2022-03-24 ENCOUNTER — Emergency Department (HOSPITAL_COMMUNITY)
Admission: EM | Admit: 2022-03-24 | Discharge: 2022-03-24 | Disposition: A | Discharge status: Home / Self Care | Payer: No Typology Code available for payment source | Attending: Emergency Medicine | Admitting: Emergency Medicine

## 2022-03-24 ENCOUNTER — Encounter (HOSPITAL_COMMUNITY): Payer: Self-pay

## 2022-03-24 ENCOUNTER — Other Ambulatory Visit: Payer: Self-pay

## 2022-03-24 ENCOUNTER — Emergency Department (HOSPITAL_BASED_OUTPATIENT_CLINIC_OR_DEPARTMENT_OTHER)
Admit: 2022-03-24 | Discharge: 2022-03-24 | Disposition: A | Discharge status: Home / Self Care | Payer: No Typology Code available for payment source

## 2022-03-24 DIAGNOSIS — R600 Localized edema: Secondary | ICD-10-CM | POA: Diagnosis not present

## 2022-03-24 DIAGNOSIS — M7989 Other specified soft tissue disorders: Secondary | ICD-10-CM

## 2022-03-24 DIAGNOSIS — I509 Heart failure, unspecified: Secondary | ICD-10-CM | POA: Diagnosis not present

## 2022-03-24 LAB — BASIC METABOLIC PANEL
Anion gap: 4 — ABNORMAL LOW (ref 5–15)
BUN: 15 mg/dL (ref 8–23)
CO2: 37 mmol/L — ABNORMAL HIGH (ref 22–32)
Calcium: 9.4 mg/dL (ref 8.9–10.3)
Chloride: 101 mmol/L (ref 98–111)
Creatinine, Ser: 1.02 mg/dL (ref 0.61–1.24)
GFR, Estimated: >60 mL/min (ref >60)
Glucose, Bld: 114 mg/dL — ABNORMAL HIGH (ref 70–99)
Potassium: 4.7 mmol/L (ref 3.5–5.1)
Sodium: 142 mmol/L (ref 135–145)

## 2022-03-24 LAB — CBC WITH DIFFERENTIAL/PLATELET
Abs Immature Granulocytes: 0.03 10*3/uL (ref 0.00–0.07)
Basophils Absolute: 0 10*3/uL (ref 0.0–0.1)
Basophils Relative: 0 %
Eosinophils Absolute: 0.1 10*3/uL (ref 0.0–0.5)
Eosinophils Relative: 2 %
HCT: 37.1 % — ABNORMAL LOW (ref 39.0–52.0)
Hemoglobin: 11.3 g/dL — ABNORMAL LOW (ref 13.0–17.0)
Immature Granulocytes: 1 %
Lymphocytes Relative: 28 %
Lymphs Abs: 1.7 10*3/uL (ref 0.7–4.0)
MCH: 29.4 pg (ref 26.0–34.0)
MCHC: 30.5 g/dL (ref 30.0–36.0)
MCV: 96.6 fL (ref 80.0–100.0)
Monocytes Absolute: 0.7 10*3/uL (ref 0.1–1.0)
Monocytes Relative: 11 %
Neutro Abs: 3.5 10*3/uL (ref 1.7–7.7)
Neutrophils Relative %: 58 %
Platelets: 170 10*3/uL (ref 150–400)
RBC: 3.84 MIL/uL — ABNORMAL LOW (ref 4.22–5.81)
RDW: 14 % (ref 11.5–15.5)
WBC: 6 10*3/uL (ref 4.0–10.5)
nRBC: 0 % (ref 0.0–0.2)

## 2022-03-24 LAB — PROTIME-INR
INR: 0.9 (ref 0.8–1.2)
Prothrombin Time: 12.3 seconds (ref 11.4–15.2)

## 2022-03-24 LAB — BRAIN NATRIURETIC PEPTIDE: B Natriuretic Peptide: 78.4 pg/mL (ref 0.0–100.0)

## 2022-03-24 MED ORDER — FUROSEMIDE 20 MG PO TABS: 20.0000 mg | ORAL_TABLET | Freq: Three times a day (TID) | ORAL | 0 refills | Status: DC

## 2022-03-24 NOTE — ED Provider Triage Note (Signed)
Emergency Medicine Provider Triage Evaluation Note  Gerald Williams , Williams 71 y.o. male  was evaluated in triage.  Pt complains of LLE swelling. He was recently admitted for PNA. Wears 5-6 L via [place] continuously at baseline.was told to come in via his PCP for an [place]. Denies chest pain, shortness of breath.  Has Williams history of CHF and is compliant with  his medications.   Review of Systems  Positive:  Negative:   Physical Exam  BP 139/87   Pulse 79   Temp 98.2 F (36.8 C) (Oral)   Resp 18   Ht 5' 5.5" (1.664 m)   Wt 82.1 kg   SpO2 100%   BMI 29.66 kg/m  Gen:   Awake, no distress   Resp:  Normal effort  MSK:   Moves extremities without difficulty  Other:  Unilateral swelling noted to LLE. No TTP.  Medical Decision Making  Medically screening exam initiated at 2:59 PM.  Appropriate orders placed.  Gerald Williams was informed that the remainder of the evaluation will be completed by another provider, this initial triage assessment does not replace that evaluation, and the importance of remaining in the ED until their evaluation is complete.     Gerald Helzer A, PA-C 03/24/22 1510

## 2022-03-24 NOTE — ED Notes (Signed)
An After Visit Summary was printed and given to the patient. Discharge instructions given and no further questions at this time.  

## 2022-03-24 NOTE — ED Provider Notes (Signed)
[facility] DEPT Provider Note   CSN: [id number] Arrival date & time: 03/24/22  1425     History  Chief Complaint  Patient presents with   Leg Swelling    Gerald Williams is a 71 y.o. male who presents to the ED with concerns for LLE swelling. He was recently admitted for PNA. Wears 5-6 L via [place] continuously at baseline.was told to come in via his PCP for an [place]. Denies chest pain, shortness of breath. Has a history of CHF and is compliant with his medications.   The history is provided by the patient. No language interpreter was used.       Home Medications Prior to Admission medications   Medication Sig Start Date End Date Taking? Authorizing Provider  furosemide (LASIX) 20 MG tablet Take 1 tablet (20 mg total) by mouth 3 (three) times daily for 3 days. 03/24/22 03/27/22 Yes Oriya Kettering A, PA-C  acetaminophen (TYLENOL) 500 MG tablet Take 1,000 mg by mouth every 6 (six) hours as needed for mild pain, fever or headache.    [provider]  albuterol (PROVENTIL) (2.5 MG/3ML) 0.083% nebulizer solution Take 2.5 mg by nebulization every 6 (six) hours as needed for wheezing or shortness of breath.    [provider]  atorvastatin (LIPITOR) 80 MG tablet Take 80 mg by mouth daily. 05/12/20   [provider]  diclofenac Sodium (VOLTAREN) 1 % GEL Apply 2 g topically 4 (four) times daily. Patient taking differently: Apply 2 g topically daily as needed (for pain). 01/01/21   Volney American, PA-C  DULoxetine (CYMBALTA) 60 MG capsule Take 60 mg by mouth 2 (two) times daily. 09/15/20   [provider]  fluticasone (FLONASE) 50 MCG/ACT nasal spray Place 2 sprays into both nostrils daily as needed for allergies. 12/29/20   [provider]  furosemide (LASIX) 20 MG tablet Take 1 tablet (20 mg total) by mouth daily. 12/29/20   O'NealCassie Freer, MD  glucose blood (COOL BLOOD GLUCOSE TEST STRIPS) test strip Used to test  blood sugar x2 daily---diagnosis code r73.03--for one touch verio flex 12/08/15   Burns, Claudina Lick, MD  ipratropium (ATROVENT) 0.03 % nasal spray Place 1 spray into both nostrils every 8 (eight) hours as needed for rhinitis. 01/06/21   Tanda Rockers, MD  Ipratropium-Albuterol (COMBIVENT RESPIMAT) 20-100 MCG/ACT AERS respimat Inhale 1 puff into the lungs every 6 (six) hours. Patient taking differently: Inhale 1 puff into the lungs every 6 (six) hours as needed for shortness of breath. 02/21/22   Tanda Rockers, MD  Lancets MISC Used to test blood sugar x2 daily---diagnosis code r73.03--for one touch verio flex 12/08/15   Binnie Rail, MD  metoprolol succinate (TOPROL-XL) 50 MG 24 hr tablet Take 1 tablet (50 mg total) by mouth daily. Take with or immediately following a meal. Patient taking differently: Take 25 mg by mouth in the morning and at bedtime. Take with or immediately following a meal. 12/11/20   [name], Cassie Freer, MD  mirtazapine (REMERON) 30 MG tablet Take 30 mg by mouth at bedtime.    [provider]  OXYGEN Inhale 4-5 L into the lungs continuous.    [provider]  polyethylene glycol (MIRALAX) 17 g packet Take 17 g by mouth daily. 03/13/22   Shelly Coss, MD  prazosin (MINIPRESS) 2 MG capsule Take 2 mg by mouth at bedtime. 08/23/21   [provider]  prazosin (MINIPRESS) 5 MG capsule Take 1 capsule by  mouth at bedtime. 08/23/21   [provider]  predniSONE (DELTASONE) 10 MG tablet Take 1 tablet (10 mg total) by mouth daily with breakfast. Take 4 pills for 3 days and then 3 pills for 3 days then 2 pills for 3 days then 1 pill for 3 days then stop 03/13/22   Shelly Coss, MD  rosuvastatin (CRESTOR) 10 MG tablet Take 10 mg by mouth daily.    [provider]  sacubitril-valsartan (ENTRESTO) 97-103 MG Take 1 tablet by mouth 2 (two) times daily. 12/02/21   [provider]  SYMBICORT 160-4.5 MCG/ACT inhaler Inhale 2 puffs into the lungs  in the morning and at bedtime. 02/21/22   Tanda Rockers, MD      Allergies    Tiotropium, Gabapentin, Nortriptyline, Budeson-glycopyrrol-formoterol, Olodaterol, Oxcarbazepine, and Tiotropium bromide-olodaterol    Review of Systems   Review of Systems  All other systems reviewed and are negative.   Physical Exam Updated Vital Signs BP 122/68   Pulse 70   Temp 98.2 F (36.8 C) (Oral)   Resp 18   Ht 5' 5.5" (1.664 m)   Wt 82.1 kg   SpO2 99%   BMI 29.66 kg/m  Physical Exam Vitals and nursing note reviewed.  Constitutional:      General: He is not in acute distress.    Appearance: He is not diaphoretic.     Comments: On 5-6 O2 via [place] (baseline)  HENT:     Head: Normocephalic and atraumatic.     Mouth/Throat:     Pharynx: No oropharyngeal exudate.  Eyes:     General: No scleral icterus.    Conjunctiva/sclera: Conjunctivae normal.  Cardiovascular:     Rate and Rhythm: Normal rate and regular rhythm.     Pulses: Normal pulses.     Heart sounds: Normal heart sounds.  Pulmonary:     Effort: Pulmonary effort is normal. No respiratory distress.     Breath sounds: Normal breath sounds. No wheezing.  Abdominal:     General: Bowel sounds are normal.     Palpations: Abdomen is soft. There is no mass.     Tenderness: There is no abdominal tenderness. There is no guarding or rebound.  Musculoskeletal:        General: Normal range of motion.     Cervical back: Normal range of motion and neck supple.     Comments: Trace to 1+ edema noted to LLE. No TTP noted to the area. No overlying skin changes.   Skin:    General: Skin is warm and dry.  Neurological:     Mental Status: He is alert.  Psychiatric:        Behavior: Behavior normal.     ED Results / Procedures / Treatments   Labs (all labs ordered are listed, but only abnormal results are displayed) Labs Reviewed  BASIC METABOLIC PANEL - Abnormal; Notable for the following components:      Result Value   CO2 37 (*)     Glucose, Bld 114 (*)    Anion gap 4 (*)    All other components within normal limits  CBC WITH DIFFERENTIAL/PLATELET - Abnormal; Notable for the following components:   RBC 3.84 (*)    Hemoglobin 11.3 (*)    HCT 37.1 (*)    All other components within normal limits  PROTIME-INR  BRAIN NATRIURETIC PEPTIDE    EKG None  Radiology VAS [place] LOWER EXTREMITY VENOUS (DVT) (7a-7p)  Result Date: 03/24/2022  Lower Venous  DVT Study Patient Name:  Gerald Williams  Date of Exam:   03/24/2022 Medical Rec #: [id number]       Accession #:    [id number] Date of Birth: 20-Jan-1951       Patient Gender: M Patient Age:   89 years Exam Location:  [facility] Procedure:      VAS [place] LOWER EXTREMITY VENOUS (DVT) Referring Phys: Velda Shell Raseel Jans --------------------------------------------------------------------------------  Indications: Swelling.  Risk Factors: None identified. Limitations: Poor ultrasound/tissue interface and patient positioning. Comparison Study: No prior studies. Performing Technologist: Chanda Busing RVT  Examination Guidelines: A complete evaluation includes B-mode imaging, spectral Doppler, color Doppler, and power Doppler as needed of all accessible portions of each vessel. Bilateral testing is considered an integral part of a complete examination. Limited examinations for reoccurring indications may be performed as noted. The reflux portion of the exam is performed with the patient in reverse Trendelenburg.  +-----+---------------+---------+-----------+----------+--------------+ RIGHTCompressibilityPhasicitySpontaneityPropertiesThrombus Aging +-----+---------------+---------+-----------+----------+--------------+ CFV  Full           Yes      Yes                                 +-----+---------------+---------+-----------+----------+--------------+   +---------+---------------+---------+-----------+----------+--------------+ LEFT      CompressibilityPhasicitySpontaneityPropertiesThrombus Aging +---------+---------------+---------+-----------+----------+--------------+ CFV      Full           Yes      Yes                                 +---------+---------------+---------+-----------+----------+--------------+ SFJ      Full                                                        +---------+---------------+---------+-----------+----------+--------------+ FV Prox  Full                                                        +---------+---------------+---------+-----------+----------+--------------+ FV Mid   Full                                                        +---------+---------------+---------+-----------+----------+--------------+ FV Distal               Yes      Yes                                 +---------+---------------+---------+-----------+----------+--------------+ PFV      Full                                                        +---------+---------------+---------+-----------+----------+--------------+ POP      Full  Yes      Yes                                 +---------+---------------+---------+-----------+----------+--------------+ PTV      Full                                                        +---------+---------------+---------+-----------+----------+--------------+ PERO     Full                                                        +---------+---------------+---------+-----------+----------+--------------+    Summary: RIGHT: - No evidence of common femoral vein obstruction.  LEFT: - There is no evidence of deep vein thrombosis in the lower extremity. However, portions of this examination were limited- see technologist comments above.  - No cystic structure found in the popliteal fossa.  *See table(s) above for measurements and observations. Electronically signed by Jamelle Haring on 03/24/2022 at 5:40:38 PM.    Final      Procedures Procedures    Medications Ordered in ED Medications - No data to display  ED Course/ Medical Decision Making/ A&P Clinical Course as of 03/25/22 1252  Thu Mar 24, 2022  1655 Re-evaluated and patient resting comfortable on stretcher. [SB]  1742 B Natriuretic Peptide: 78.4 [SB]  1802 Discussed discharge treatment plan with patient at bedside.  Answered all available questions.  Patient appears safe for discharge at this time. [SB]    Clinical Course User Index [SB] Tawnya Pujol A, PA-C                           Medical Decision Making Amount and/or Complexity of Data Reviewed Labs: ordered. Decision-making details documented in ED Course.  Risk Prescription drug management.   Pt presents with concerns for LLE swelling. No chest pain or shortness of breath. No anticoagulant use. No PMHx of DVT/PE. No recent long travel or surgeries. Pt afebrile. On exam, pt with Trace to 1+ edema noted to LLE. No TTP noted to the area. No overlying skin changes. No acute cardiovascular, respiratory, exam findings. Differential diagnosis includes DVT, CHF exacerbation, cellulitis.    Co morbidities that complicate the patient evaluation: HTN CHF COPD  Labs:  I ordered, and personally interpreted labs.  The pertinent results include:   BNP unremarkable Coags unremarkable BMP unremarkable CBC unremarkable  Imaging: I ordered imaging studies including  LLE DVT ultrasound I independently visualized and interpreted imaging which showed: no concerns for DVT I agree with the radiologist interpretation  Disposition: Presentation suspicious for LLE swelling. Doubt DVT at this time. Doubt CHF exacerbation or cellulitis at this time. After consideration of the diagnostic results and the patients response to treatment, I feel that the patient would benefit from Discharge home. Case discussed with Attending who evaluated patient and agrees with discharge treatment plan. Instructed  patient to utilize compression stockings. Lasix Rx sent to pharmacy for 20 mg TID x 3 days and patient instructed to continue his Rx lasix as prescribed following. Also instructed to follow up with his primary  care provider. Supportive care measures and strict return precautions discussed with patient at bedside. Pt acknowledges and verbalizes understanding. Pt appears safe for discharge. Follow up as indicated in discharge paperwork.    This chart was dictated using voice recognition software, Dragon. Despite the best efforts of this provider to proofread and correct errors, errors may still occur which can change documentation meaning.  Final Clinical Impression(s) / ED Diagnoses Final diagnoses:  Leg swelling    Rx / DC Orders ED Discharge Orders          Ordered    furosemide (LASIX) 20 MG tablet  3 times daily        03/24/22 1805              Leilanny Fluitt A, PA-C 03/25/22 1252    Drenda Freeze, MD 03/25/22 [phone]

## 2022-03-24 NOTE — ED Triage Notes (Signed)
Patient reports that he was recently hospitalized for pneumonia.  Patient noticed today that he had left leg swelling and went to his PCP. PCP sent the patient to the ED to r/o a DVT.  Patient states he has home O2 5-6 liters. Patient has a history of COPD.

## 2022-03-24 NOTE — Discharge Instructions (Addendum)
It was a pleasure taking care of you today!  Your ultrasound did not show any concerns for a blood clot in your leg today.  Your labs did not show any concerning findings.  You will be sent a prescription for Lasix, take as directed for the next 3 days.  After you finish this prescription you may then continue with your daily dose of Lasix as prescribed.  Follow-up with your primary care provider as needed regarding today's ED visit.  Obtain compression stockings and wear throughout the day.  Ensure to elevate your legs when you are sitting.  Return to the emergency department if you are experiencing increasing/worsening symptoms.

## 2022-03-24 NOTE — Progress Notes (Signed)
Left lower extremity venous duplex has been completed. Preliminary results can be found in CV Proc through chart review.  Results were given to [facility] PA.  03/24/22 3:31 PM Olen Cordial RVT

## 2022-04-15 IMAGING — DX DG CHEST 1V PORT
1 series · 1 of 1 positions shown · non-contrast
Comparison: Chest x-ray dated November 10, 2020

CLINICAL DATA: Left chest pain

EXAM:
PORTABLE CHEST 1 VIEW

[chest ap]
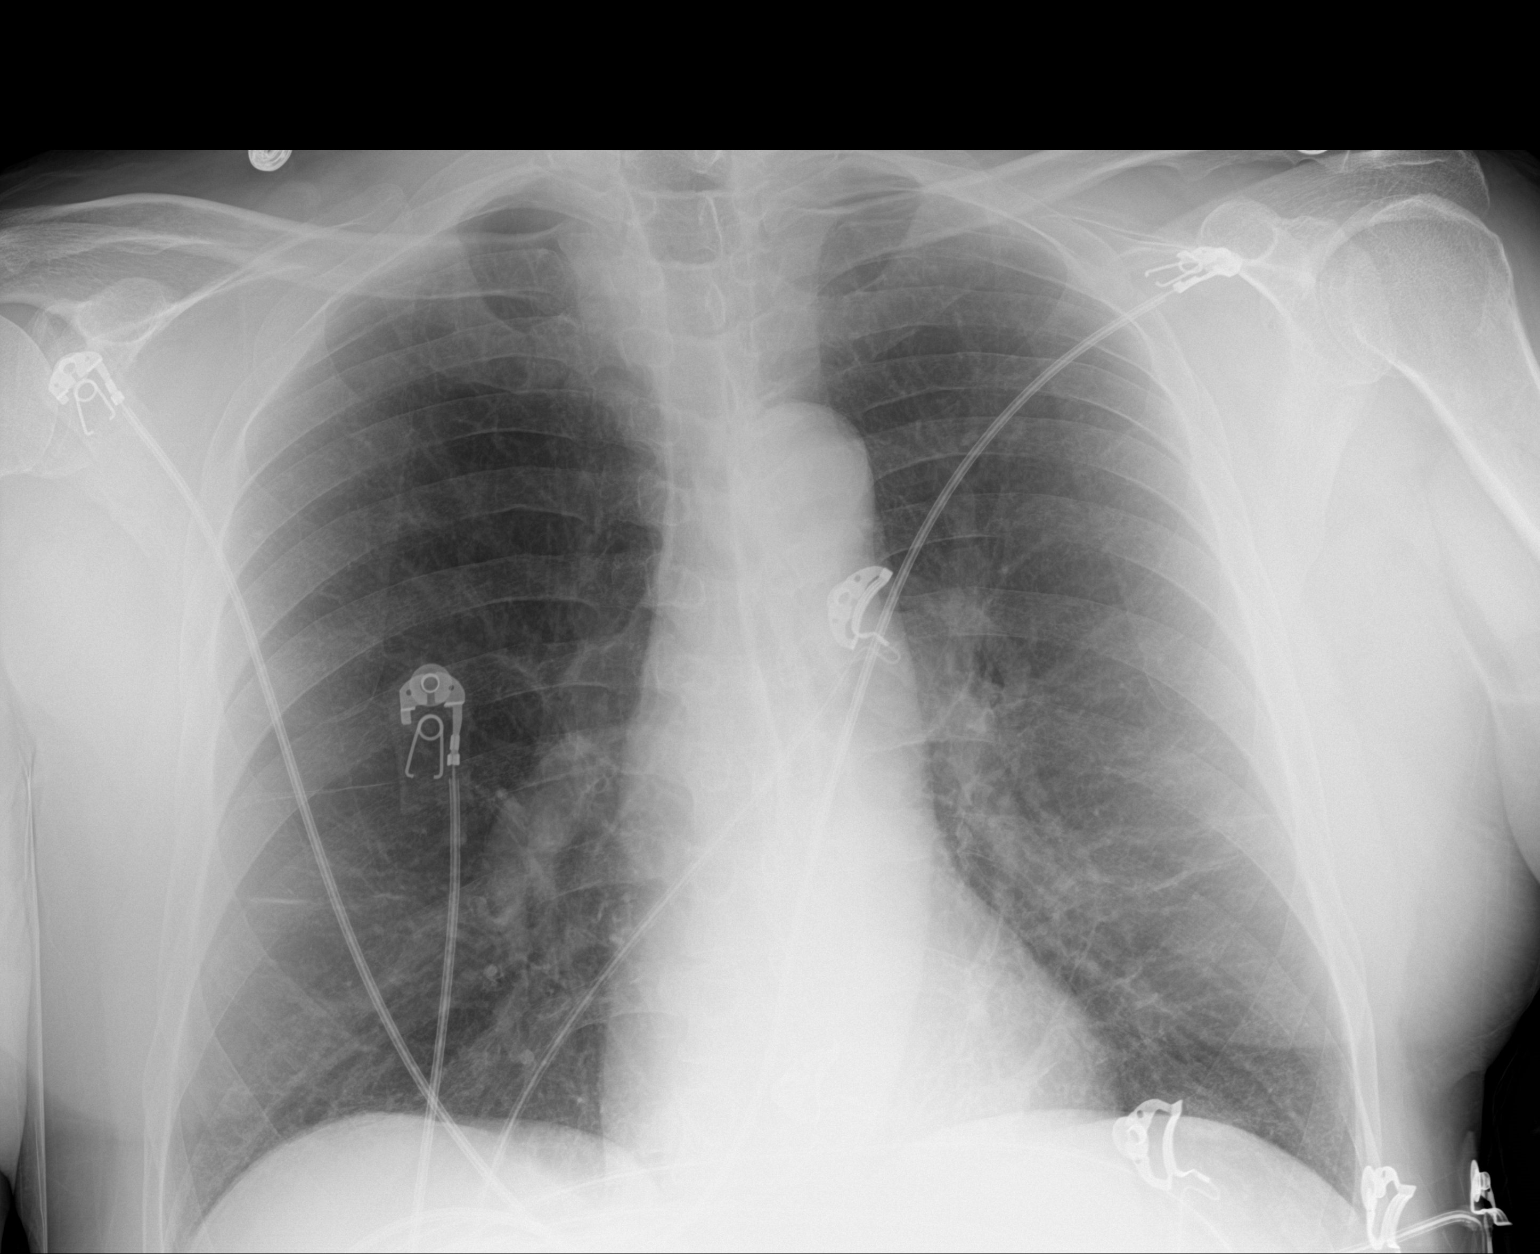

[1 of 1 positions shown; findings below may reference images not displayed]

FINDINGS: Cardiac and mediastinal contours are unchanged and within normal
limits. Click

Redemonstrated bilateral linear opacities, similar to prior exams
and likely due to scarring. No large pleural effusion or
pneumothorax.
IMPRESSION: No new focal parenchymal opacity.

## 2022-04-15 IMAGING — CT CT ANGIO CHEST
2 of 5 series · 18 of 36 positions shown · IV contrast (omnipaque)
Comparison: April 23, 2018.

CLINICAL DATA: Chest pain.

EXAM:
CT ANGIOGRAPHY CHEST WITH CONTRAST
TECHNIQUE: Multidetector CT imaging of the chest was performed using the
standard protocol during bolus administration of intravenous
contrast. Multiplanar CT image reconstructions and MIPs were
obtained to evaluate the vascular anatomy.
CONTRAST:  80mL OMNIPAQUE IOHEXOL 350 MG/ML SOLN

[Series 5: thins · axial · 0.75mm/px · z∈[+1252,+1512]mm · 17 of 294 slices shown]
[im 17/294  lung]
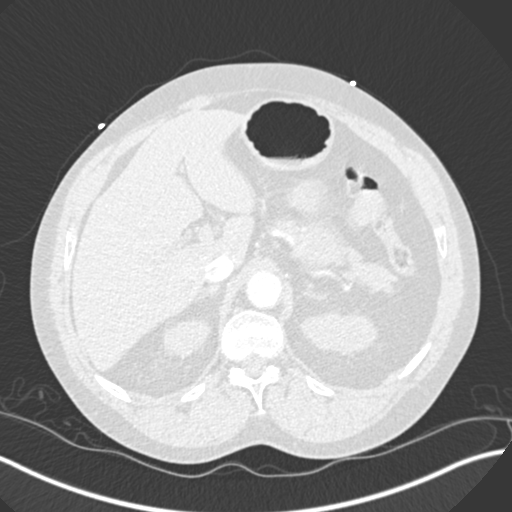
[im 33/294  mediastinal]
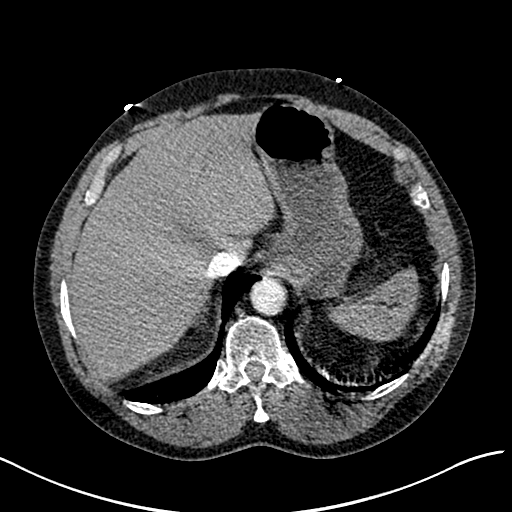
[im 49/294  lung]
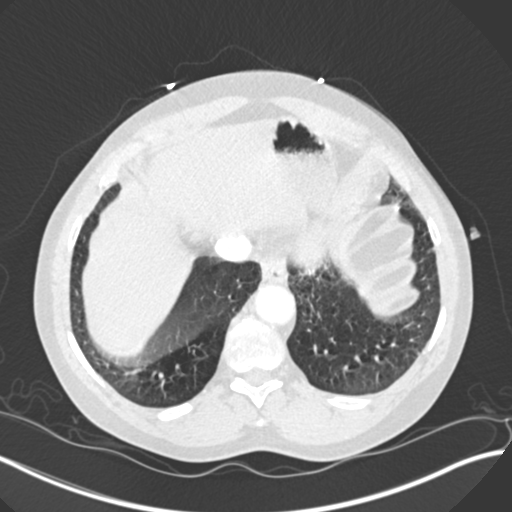
[im 66/294  mediastinal]
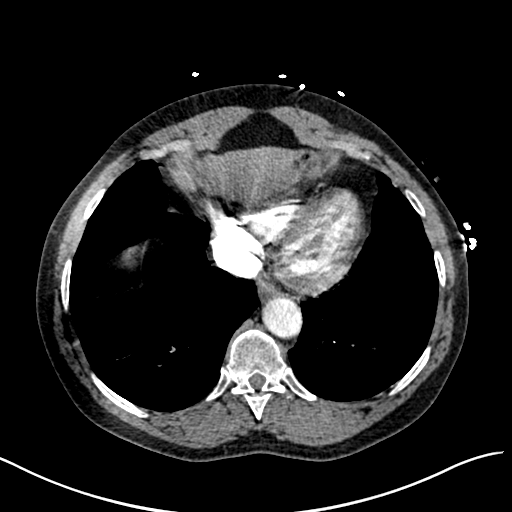
[im 82/294  lung]
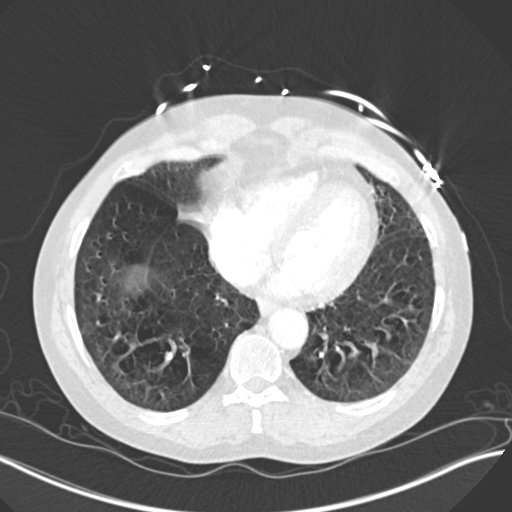
[im 98/294  mediastinal]
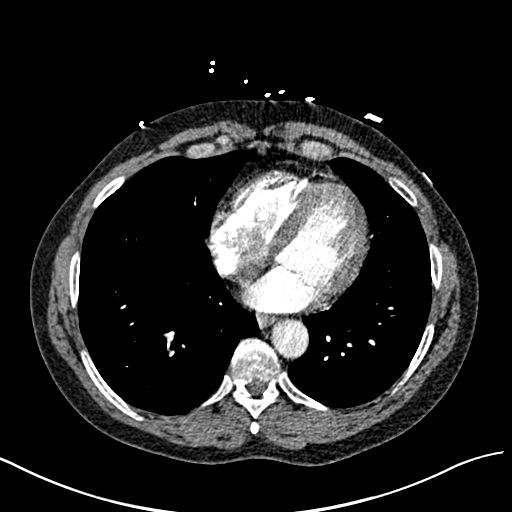
[im 114/294  lung]
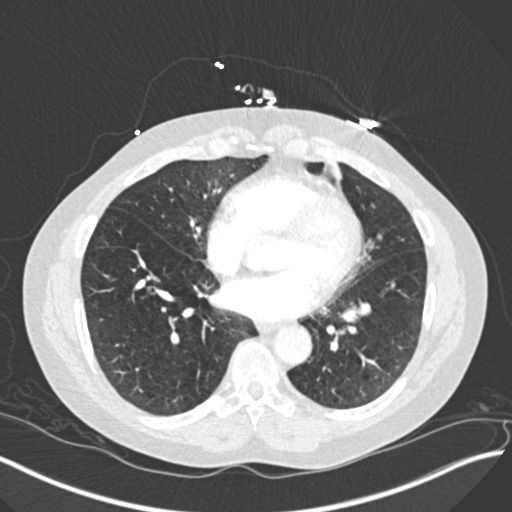
[im 131/294  mediastinal]
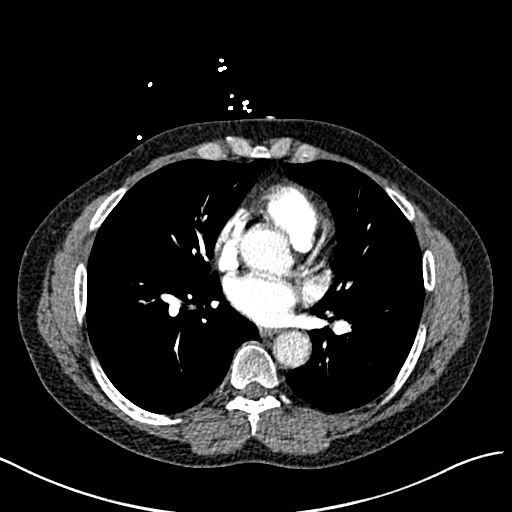
[im 147/294  lung]
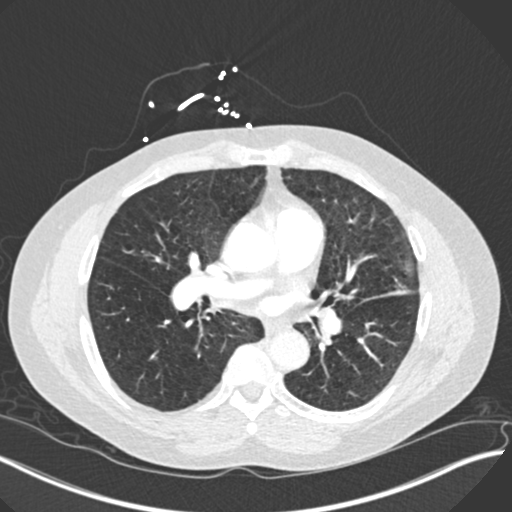
[im 163/294  mediastinal]
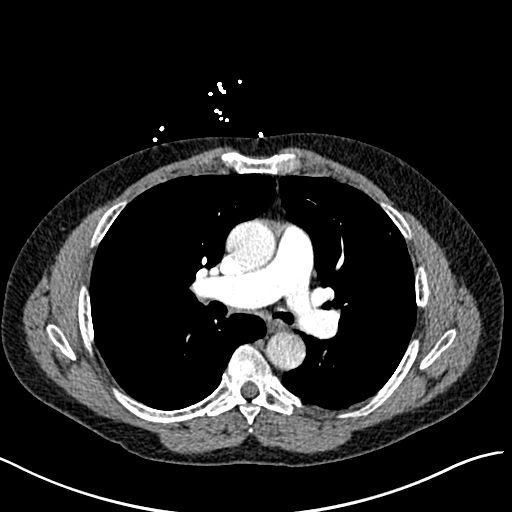
[im 180/294  lung]
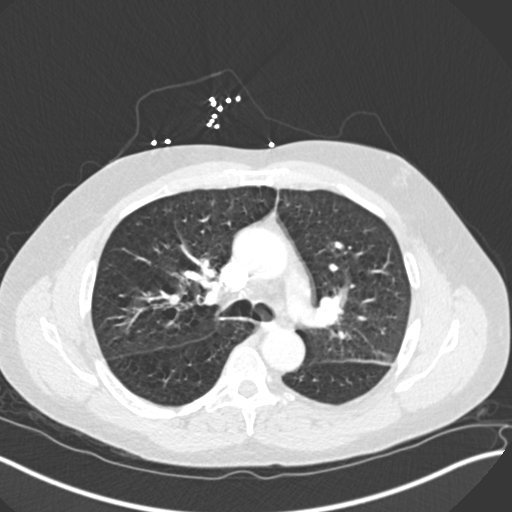
[im 196/294  mediastinal]
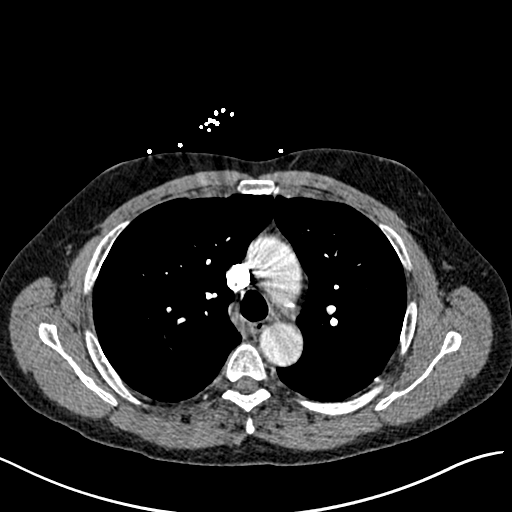
[im 212/294  lung]
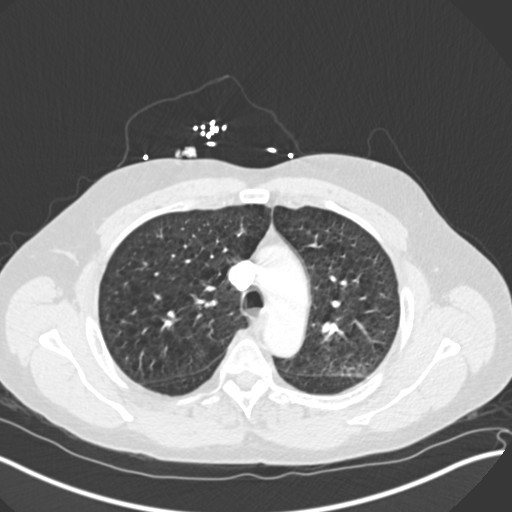
[im 228/294  mediastinal]
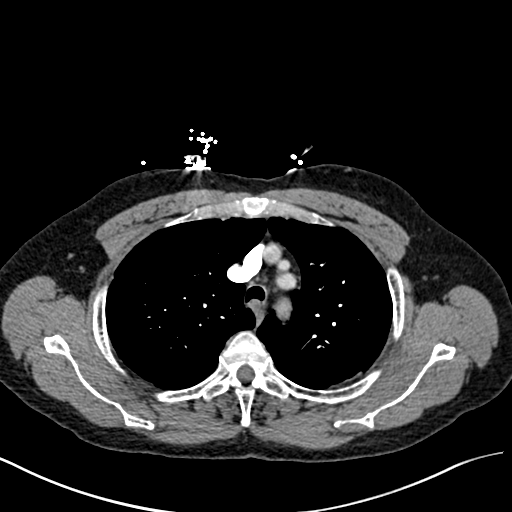
[im 245/294  lung]
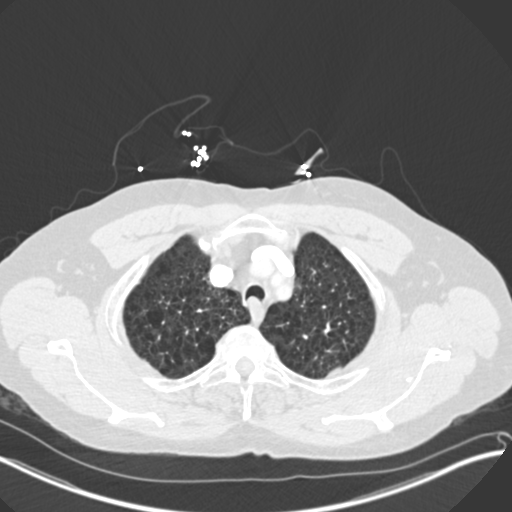
[im 261/294  mediastinal]
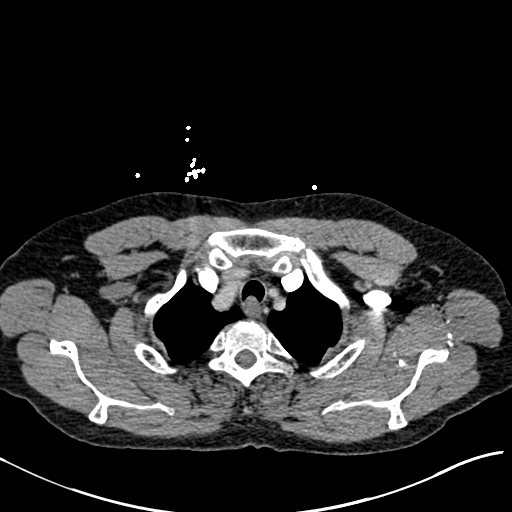
[im 277/294  lung]
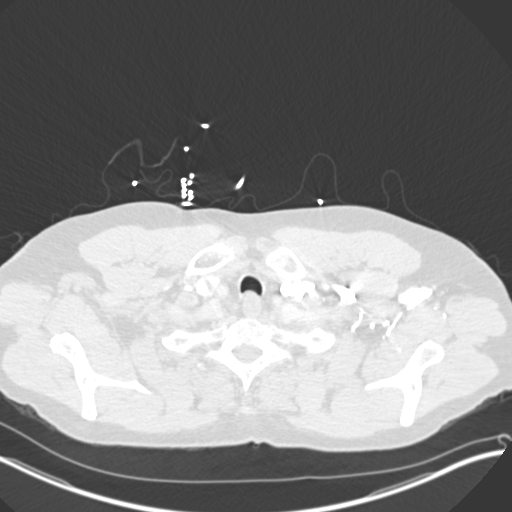

[Series 6: coronal mpr · coronal · 0.60mm/px · 1 of 151 slices shown]
[im 76/151  mediastinal]
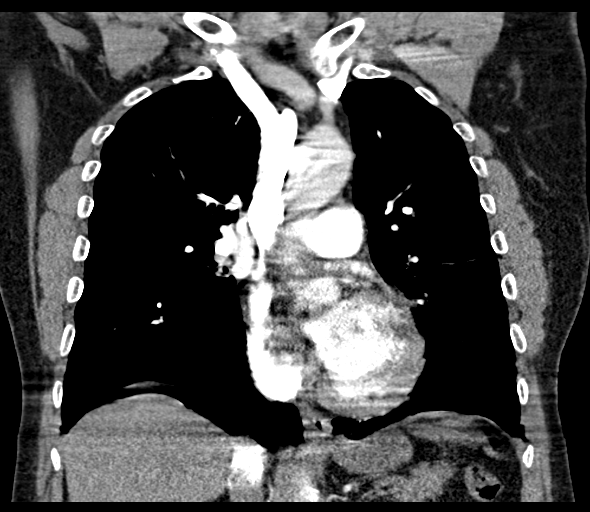

[18 of 36 positions shown; findings below may reference images not displayed]

FINDINGS: Cardiovascular: Satisfactory opacification of the pulmonary arteries
to the segmental level. No evidence of pulmonary embolism. Normal
heart size. No pericardial effusion.

Mediastinum/Nodes: No enlarged mediastinal, hilar, or axillary lymph
nodes. Thyroid gland, trachea, and esophagus demonstrate no
significant findings.

Lungs/Pleura: No pneumothorax or pleural effusion is noted. Mild
emphysematous disease is noted. Minimal subsegmental atelectasis is
noted in the lingular segment of the left upper lobe. Focal
ill-defined opacity measuring 12 x 7 mm is noted in the periphery of
the left upper lobe best seen on image number 68 of series 8. This
may represent focal inflammation, but malignancy or neoplasm cannot
be excluded.

Upper Abdomen: No acute abnormality.

Musculoskeletal: No chest wall abnormality. No acute or significant
osseous findings.

Review of the MIP images confirms the above findings.
IMPRESSION: No definite evidence of pulmonary embolus.

Focal ill-defined opacity measuring 12 x 7 mm is noted in the
periphery of the left upper lobe. While this may represent focal
inflammation or atelectasis, malignancy cannot be excluded.
Follow-up unenhanced CT scan in 3-4 weeks is recommended to ensure
resolution or stability.

Aortic Atherosclerosis (EDZA3-FOA.A) and Emphysema (EDZA3-150.A).

## 2022-05-01 NOTE — Progress Notes (Unsigned)
Cardiology Office Note:   Date:  05/01/2022  NAME:  Gerald Williams    MRN: [id number] DOB:  06-Jun-1950   PCP:  [facility] [place]  Cardiologist:  Evalina Field, MD  Electrophysiologist:  None   Referring MD: [place]   No chief complaint on file. ***  History of Present Illness:   Gerald Williams is a 72 y.o. male with a hx of systolic HF, COPD, HTN who presents for follow-up. EF improved at last visit.   Problem List Systolic HF -EF 25% 09/26/8935 -EF 45-50% 05/27/2021 -Normal LHC 10/30/2020 2. Pulmonary hypertension -PCWP 28 (Group 2) 3. COPD 4. HTN 5. HLD -T chol 244, HDL 79, LDL 147, TG 92  Past Medical History: Past Medical History:  Diagnosis Date   CHF (congestive heart failure) (HCC)    Emphysema    Erectile dysfunction    Hyperlipemia    Hypertension    Polysubstance abuse ([place])    stopped using crack/cocaine December 4th 20-14   Pre-diabetes    Sleep apnea     Past Surgical History: Past Surgical History:  Procedure Laterality Date   CARDIAC CATHETERIZATION     CYSTOSCOPY WITH INSERTION OF UROLIFT N/A 08/03/2020   Procedure: CYSTOSCOPY WITH INSERTION OF UROLIFT;  Surgeon: Franchot Gallo, MD;  Location: [facility];  Service: Urology;  Laterality: N/A;  [place] N/A 12/13/2021   Procedure: CYSTOSCOPY WITH INSERTION OF UROLIFT;  Surgeon: Franchot Gallo, MD;  Location: [facility];  Service: Urology;  Laterality: N/A;  30 MINS   LEFT HEART CATHETERIZATION WITH CORONARY ANGIOGRAM N/A 07/15/2014   Procedure: LEFT HEART CATHETERIZATION WITH CORONARY ANGIOGRAM;  Surgeon: Troy Sine, MD;  Location: [facility] CATH LAB;  Service: Cardiovascular;  Laterality: N/A;   RIGHT/LEFT HEART CATH AND CORONARY ANGIOGRAPHY N/A 10/30/2020   Procedure: RIGHT/LEFT HEART CATH AND CORONARY ANGIOGRAPHY;  Surgeon: Wellington Hampshire, MD;  Location: [facility] CV LAB;  Service: Cardiovascular;  Laterality: N/A;    Current  Medications: No outpatient medications have been marked as taking for the 05/05/22 encounter (Appointment) with [name], Cassie Freer, MD.     Allergies:    Tiotropium, Gabapentin, Nortriptyline, Budeson-glycopyrrol-formoterol, Olodaterol, Oxcarbazepine, and Tiotropium bromide-olodaterol   Social History: Social History   Socioeconomic History   Marital status: Divorced    Spouse name: Not on file   Number of children: 4   Years of education: Not on file   Highest education level: Not on file  Occupational History   Occupation: Biomedical scientist - retired   Occupation: Hospital doctor  Tobacco Use   Smoking status: Former    Packs/day: 1.50    Years: 50.00    Total pack years: 75.00    Types: Cigarettes    Start date: 04/26/1963    Quit date: 09/22/2013    Years since quitting: 8.6   Smokeless tobacco: Never   Tobacco comments:    patient using the patch, losenges, and medication  Vaping Use   Vaping Use: Never used  Substance and Sexual Activity   Alcohol use: Not Currently    Comment: He was drinking 3-4 drinks nightly x 40 years, quit in 2014.     Drug use: No    Comment: clean x 12 years   Sexual activity: Yes  Other Topics Concern   Not on file  Social History Narrative    Education: 14 years and 20 years of [employer]. Divorced.   Social Determinants of Health  Financial Resource Strain: Low Risk  (07/13/2017)   Overall Financial Resource Strain (CARDIA)    Difficulty of Paying Living Expenses: Not hard at all  Food Insecurity: No Food Insecurity (03/13/2022)   Hunger Vital Sign    Worried About Running Out of Food in the Last Year: Never true    Ran Out of Food in the Last Year: Never true  Transportation Needs: No Transportation Needs (03/13/2022)   PRAPARE - Administrator, Civil Service (Medical): No    Lack of Transportation (Non-Medical): No  Physical Activity: Insufficiently Active (07/13/2017)   Exercise Vital Sign    Days of Exercise per Week: 4 days     Minutes of Exercise per Session: 30 min  Stress: No Stress Concern Present (07/13/2017)   [employer] of Occupational Health - Occupational Stress Questionnaire    Feeling of Stress : Not at all  Social Connections: Socially Integrated (07/13/2017)   Social Connection and Isolation Panel [NHANES]    Frequency of Communication with Friends and Family: More than three times a week    Frequency of Social Gatherings with Friends and Family: More than three times a week    Attends Religious Services: More than 4 times per year    Active Member of [employer] or Organizations: Yes    Attends Engineer, structural: More than 4 times per year    Marital Status: Married     Family History: The patient's ***family history includes Alcohol abuse in his brother; Cancer - Other in his sister; Diabetes in his mother; Healthy in his daughter and son; Heart attack in his brother and father; Heart disease in his father.  ROS:   All other ROS reviewed and negative. Pertinent positives noted in the HPI.     EKGs/Labs/Other Studies Reviewed:   The following studies were personally reviewed by me today:  EKG:  EKG is *** ordered today.  The ekg ordered today demonstrates ***, and was personally reviewed by me.   Recent Labs: 03/12/2022: ALT 16 03/13/2022: Magnesium 2.5 03/24/2022: B Natriuretic Peptide 78.4; BUN 15; Creatinine, Ser 1.02; Hemoglobin 11.3; Platelets 170; Potassium 4.7; Sodium 142   Recent Lipid Panel    Component Value Date/Time   CHOL 244 (H) 10/29/2020 1250   TRIG 92 10/29/2020 1250   HDL 79 10/29/2020 1250   CHOLHDL 3.1 10/29/2020 1250   VLDL 18 10/29/2020 1250   LDLCALC 147 (H) 10/29/2020 1250    Physical Exam:   VS:  There were no vitals taken for this visit.   Wt Readings from Last 3 Encounters:  03/24/22 181 lb (82.1 kg)  02/26/22 178 lb (80.7 kg)  01/12/22 178 lb 3.2 oz (80.8 kg)    General: Well nourished, well developed, in no acute distress Head:  Atraumatic, normal size  Eyes: PEERLA, EOMI  Neck: Supple, no JVD Endocrine: No thryomegaly Cardiac: Normal S1, S2; RRR; no murmurs, rubs, or gallops Lungs: Clear to auscultation bilaterally, no wheezing, rhonchi or rales  Abd: Soft, nontender, no hepatomegaly  Ext: No edema, pulses 2+ Musculoskeletal: No deformities, BUE and BLE strength normal and equal Skin: Warm and dry, no rashes   Neuro: Alert and oriented to person, place, time, and situation, CNII-XII grossly intact, no focal deficits  Psych: Normal mood and affect   ASSESSMENT:   Gerald Williams is a 72 y.o. male who presents for the following: No diagnosis found.  PLAN:   There are no diagnoses linked to this encounter.  {Are you  ordering a CV Procedure (e.g. stress test, cath, DCCV, TEE, etc)?   Press F2        :[id number]}  Disposition: No follow-ups on file.  Medication Adjustments/Labs and Tests Ordered: Current medicines are reviewed at length with the patient today.  Concerns regarding medicines are outlined above.  No orders of the defined types were placed in this encounter.  No orders of the defined types were placed in this encounter.   There are no Patient Instructions on file for this visit.   Time Spent with Patient: I have spent a total of *** minutes with patient reviewing hospital notes, telemetry, EKGs, labs and examining the patient as well as establishing an assessment and plan that was discussed with the patient.  > 50% of time was spent in direct patient care.  Signed, Lenna Gilford. Flora Lipps, MD, [facility]  [facility]  [address] [place], [place] [address] [phone]  05/01/2022 9:01 PM

## 2022-05-05 ENCOUNTER — Encounter: Payer: Self-pay | Admitting: Cardiovascular Disease

## 2022-05-05 ENCOUNTER — Ambulatory Visit: Payer: Non-veteran care | Attending: Cardiovascular Disease | Admitting: Cardiovascular Disease

## 2022-05-05 VITALS — BP 130/68 | HR 78 | Ht 65.5 in | Wt 173.0 lb

## 2022-05-05 DIAGNOSIS — I10 Essential (primary) hypertension: Secondary | ICD-10-CM | POA: Diagnosis not present

## 2022-05-05 DIAGNOSIS — I272 Pulmonary hypertension, unspecified: Secondary | ICD-10-CM | POA: Diagnosis not present

## 2022-05-05 DIAGNOSIS — I5022 Chronic systolic (congestive) heart failure: Secondary | ICD-10-CM | POA: Diagnosis not present

## 2022-05-05 LAB — BASIC METABOLIC PANEL
BUN/Creatinine Ratio: 12 (ref 10–24)
BUN: 15 mg/dL (ref 8–27)
CO2: 32 mmol/L — ABNORMAL HIGH (ref 20–29)
Calcium: 9.9 mg/dL (ref 8.6–10.2)
Chloride: 103 mmol/L (ref 96–106)
Creatinine, Ser: 1.25 mg/dL (ref 0.76–1.27)
Glucose: 101 mg/dL — ABNORMAL HIGH (ref 70–99)
Potassium: 4.5 mmol/L (ref 3.5–5.2)
Sodium: 147 mmol/L — ABNORMAL HIGH (ref 134–144)
eGFR: 62 mL/min/{1.73_m2} (ref >59)

## 2022-05-05 MED ORDER — SPIRONOLACTONE 25 MG PO TABS: 25.0000 mg | ORAL_TABLET | Freq: Every day | ORAL | 3 refills | Status: AC

## 2022-05-05 MED ORDER — METOPROLOL SUCCINATE ER 50 MG PO TB24: 50.0000 mg | ORAL_TABLET | Freq: Every day | ORAL | 1 refills | Status: AC

## 2022-05-05 MED ORDER — SACUBITRIL-VALSARTAN 97-103 MG PO TABS: 1.0000 | ORAL_TABLET | Freq: Two times a day (BID) | ORAL | 3 refills | Status: DC

## 2022-05-05 MED ORDER — FUROSEMIDE 20 MG PO TABS: 20.0000 mg | ORAL_TABLET | Freq: Every day | ORAL | 1 refills | Status: AC

## 2022-05-05 NOTE — Patient Instructions (Signed)
Medication Instructions:  START back on Aldactone 25 mg daily  Refill sent to pharmacy   *If you need a refill on your cardiac medications before your next appointment, please call your pharmacy*   Lab Work: BMET today   If you have labs (blood work) drawn today and your tests are completely normal, you will receive your results only by: [place] (if you have MyChart) OR A paper copy in the mail If you have any lab test that is abnormal or we need to change your treatment, we will call you to review the results.   Follow-Up: At [facility], you and your health needs are our priority.  As part of our continuing mission to provide you with exceptional heart care, we have created designated Provider Care Teams.  These Care Teams include your primary Cardiologist (physician) and Advanced Practice Providers (APPs -  Physician Assistants and Nurse Practitioners) who all work together to provide you with the care you need, when you need it.  We recommend signing up for the patient portal called "MyChart".  Sign up information is provided on this After Visit Summary.  MyChart is used to connect with patients for Virtual Visits (Telemedicine).  Patients are able to view lab/test results, encounter notes, upcoming appointments, etc.  Non-urgent messages can be sent to your provider as well.   To learn more about what you can do with MyChart, go to [email or web address].    Your next appointment:   12 month(s)  Provider:   Evalina Field, MD

## 2022-05-17 DIAGNOSIS — J432 Centrilobular emphysema: Secondary | ICD-10-CM | POA: Diagnosis not present

## 2022-05-17 DIAGNOSIS — I2510 Atherosclerotic heart disease of native coronary artery without angina pectoris: Secondary | ICD-10-CM | POA: Diagnosis not present

## 2022-05-17 DIAGNOSIS — Z9981 Dependence on supplemental oxygen: Secondary | ICD-10-CM | POA: Diagnosis not present

## 2022-05-17 DIAGNOSIS — I10 Essential (primary) hypertension: Secondary | ICD-10-CM | POA: Diagnosis not present

## 2022-05-17 DIAGNOSIS — G4733 Obstructive sleep apnea (adult) (pediatric): Secondary | ICD-10-CM | POA: Diagnosis not present

## 2022-05-17 DIAGNOSIS — I700 Atherosclerosis of aorta: Secondary | ICD-10-CM | POA: Diagnosis not present

## 2022-05-17 DIAGNOSIS — E782 Mixed hyperlipidemia: Secondary | ICD-10-CM | POA: Diagnosis not present

## 2022-05-17 DIAGNOSIS — R7303 Prediabetes: Secondary | ICD-10-CM | POA: Diagnosis not present

## 2022-05-24 ENCOUNTER — Other Ambulatory Visit: Payer: Self-pay | Admitting: *Deleted

## 2022-05-24 DIAGNOSIS — I2510 Atherosclerotic heart disease of native coronary artery without angina pectoris: Secondary | ICD-10-CM | POA: Diagnosis not present

## 2022-05-24 DIAGNOSIS — J9611 Chronic respiratory failure with hypoxia: Secondary | ICD-10-CM | POA: Diagnosis not present

## 2022-05-24 DIAGNOSIS — E782 Mixed hyperlipidemia: Secondary | ICD-10-CM | POA: Diagnosis not present

## 2022-05-24 DIAGNOSIS — Z9981 Dependence on supplemental oxygen: Secondary | ICD-10-CM | POA: Diagnosis not present

## 2022-05-24 DIAGNOSIS — R7303 Prediabetes: Secondary | ICD-10-CM | POA: Diagnosis not present

## 2022-05-24 DIAGNOSIS — J432 Centrilobular emphysema: Secondary | ICD-10-CM | POA: Diagnosis not present

## 2022-05-24 DIAGNOSIS — I10 Essential (primary) hypertension: Secondary | ICD-10-CM | POA: Diagnosis not present

## 2022-05-24 DIAGNOSIS — G4733 Obstructive sleep apnea (adult) (pediatric): Secondary | ICD-10-CM | POA: Diagnosis not present

## 2022-05-24 DIAGNOSIS — I700 Atherosclerosis of aorta: Secondary | ICD-10-CM | POA: Diagnosis not present

## 2022-05-24 DIAGNOSIS — Z0000 Encounter for general adult medical examination without abnormal findings: Secondary | ICD-10-CM | POA: Diagnosis not present

## 2022-05-24 MED ORDER — COMBIVENT RESPIMAT 20-100 MCG/ACT IN AERS: 1.0000 | INHALATION_SPRAY | Freq: Four times a day (QID) | RESPIRATORY_TRACT | 3 refills | Status: AC | PRN

## 2022-05-24 MED ORDER — SYMBICORT 160-4.5 MCG/ACT IN AERO: 2.0000 | INHALATION_SPRAY | Freq: Two times a day (BID) | RESPIRATORY_TRACT | 5 refills | Status: AC

## 2022-06-02 DIAGNOSIS — J432 Centrilobular emphysema: Secondary | ICD-10-CM | POA: Diagnosis not present

## 2022-06-02 DIAGNOSIS — J9611 Chronic respiratory failure with hypoxia: Secondary | ICD-10-CM | POA: Diagnosis not present

## 2022-06-02 DIAGNOSIS — J449 Chronic obstructive pulmonary disease, unspecified: Secondary | ICD-10-CM | POA: Diagnosis not present

## 2022-07-13 DIAGNOSIS — R350 Frequency of micturition: Secondary | ICD-10-CM | POA: Diagnosis not present

## 2022-09-01 DIAGNOSIS — L0390 Cellulitis, unspecified: Secondary | ICD-10-CM | POA: Diagnosis not present

## 2022-11-07 DIAGNOSIS — Z7682 Awaiting organ transplant status: Secondary | ICD-10-CM | POA: Diagnosis not present

## 2022-11-07 DIAGNOSIS — Z01818 Encounter for other preprocedural examination: Secondary | ICD-10-CM | POA: Diagnosis not present

## 2022-11-07 DIAGNOSIS — R918 Other nonspecific abnormal finding of lung field: Secondary | ICD-10-CM | POA: Diagnosis not present

## 2022-11-07 DIAGNOSIS — J439 Emphysema, unspecified: Secondary | ICD-10-CM | POA: Diagnosis not present

## 2022-11-08 DIAGNOSIS — J439 Emphysema, unspecified: Secondary | ICD-10-CM | POA: Diagnosis not present

## 2022-11-08 DIAGNOSIS — J9611 Chronic respiratory failure with hypoxia: Secondary | ICD-10-CM | POA: Diagnosis not present

## 2022-11-08 DIAGNOSIS — Z01818 Encounter for other preprocedural examination: Secondary | ICD-10-CM | POA: Diagnosis not present

## 2022-11-08 DIAGNOSIS — I509 Heart failure, unspecified: Secondary | ICD-10-CM | POA: Diagnosis not present

## 2022-11-08 DIAGNOSIS — Z7682 Awaiting organ transplant status: Secondary | ICD-10-CM | POA: Diagnosis not present

## 2022-11-08 DIAGNOSIS — I2510 Atherosclerotic heart disease of native coronary artery without angina pectoris: Secondary | ICD-10-CM | POA: Diagnosis not present

## 2022-11-08 DIAGNOSIS — R0602 Shortness of breath: Secondary | ICD-10-CM | POA: Diagnosis not present

## 2022-12-12 DIAGNOSIS — R918 Other nonspecific abnormal finding of lung field: Secondary | ICD-10-CM | POA: Diagnosis not present

## 2022-12-12 DIAGNOSIS — J9811 Atelectasis: Secondary | ICD-10-CM | POA: Diagnosis not present

## 2022-12-12 DIAGNOSIS — J432 Centrilobular emphysema: Secondary | ICD-10-CM | POA: Diagnosis not present

## 2022-12-12 DIAGNOSIS — Z01818 Encounter for other preprocedural examination: Secondary | ICD-10-CM | POA: Diagnosis not present

## 2022-12-12 DIAGNOSIS — R911 Solitary pulmonary nodule: Secondary | ICD-10-CM | POA: Diagnosis not present

## 2022-12-12 DIAGNOSIS — Z7682 Awaiting organ transplant status: Secondary | ICD-10-CM | POA: Diagnosis not present

## 2022-12-13 DIAGNOSIS — Z0389 Encounter for observation for other suspected diseases and conditions ruled out: Secondary | ICD-10-CM | POA: Diagnosis not present

## 2022-12-13 DIAGNOSIS — Z01810 Encounter for preprocedural cardiovascular examination: Secondary | ICD-10-CM | POA: Diagnosis not present

## 2022-12-13 DIAGNOSIS — R7303 Prediabetes: Secondary | ICD-10-CM | POA: Diagnosis not present

## 2022-12-13 DIAGNOSIS — Z87891 Personal history of nicotine dependence: Secondary | ICD-10-CM | POA: Diagnosis not present

## 2022-12-13 DIAGNOSIS — I10 Essential (primary) hypertension: Secondary | ICD-10-CM | POA: Diagnosis not present

## 2022-12-13 DIAGNOSIS — Z79899 Other long term (current) drug therapy: Secondary | ICD-10-CM | POA: Diagnosis not present

## 2022-12-13 DIAGNOSIS — Z7682 Awaiting organ transplant status: Secondary | ICD-10-CM | POA: Diagnosis not present

## 2022-12-13 DIAGNOSIS — J449 Chronic obstructive pulmonary disease, unspecified: Secondary | ICD-10-CM | POA: Diagnosis not present

## 2022-12-13 DIAGNOSIS — D649 Anemia, unspecified: Secondary | ICD-10-CM | POA: Diagnosis not present

## 2022-12-13 DIAGNOSIS — E785 Hyperlipidemia, unspecified: Secondary | ICD-10-CM | POA: Diagnosis not present

## 2022-12-14 DIAGNOSIS — R262 Difficulty in walking, not elsewhere classified: Secondary | ICD-10-CM | POA: Diagnosis not present

## 2022-12-14 DIAGNOSIS — Z87891 Personal history of nicotine dependence: Secondary | ICD-10-CM | POA: Diagnosis not present

## 2022-12-14 DIAGNOSIS — E119 Type 2 diabetes mellitus without complications: Secondary | ICD-10-CM | POA: Diagnosis not present

## 2022-12-14 DIAGNOSIS — Z7682 Awaiting organ transplant status: Secondary | ICD-10-CM | POA: Diagnosis not present

## 2022-12-14 DIAGNOSIS — J449 Chronic obstructive pulmonary disease, unspecified: Secondary | ICD-10-CM | POA: Diagnosis not present

## 2022-12-14 DIAGNOSIS — Z01818 Encounter for other preprocedural examination: Secondary | ICD-10-CM | POA: Diagnosis not present

## 2022-12-14 DIAGNOSIS — I110 Hypertensive heart disease with heart failure: Secondary | ICD-10-CM | POA: Diagnosis not present

## 2022-12-14 DIAGNOSIS — I5022 Chronic systolic (congestive) heart failure: Secondary | ICD-10-CM | POA: Diagnosis not present

## 2022-12-15 DIAGNOSIS — Z7682 Awaiting organ transplant status: Secondary | ICD-10-CM | POA: Diagnosis not present

## 2022-12-15 DIAGNOSIS — Z87891 Personal history of nicotine dependence: Secondary | ICD-10-CM | POA: Diagnosis not present

## 2022-12-15 DIAGNOSIS — Z01818 Encounter for other preprocedural examination: Secondary | ICD-10-CM | POA: Diagnosis not present

## 2022-12-15 DIAGNOSIS — R0602 Shortness of breath: Secondary | ICD-10-CM | POA: Diagnosis not present

## 2022-12-15 DIAGNOSIS — K224 Dyskinesia of esophagus: Secondary | ICD-10-CM | POA: Diagnosis not present

## 2022-12-15 DIAGNOSIS — R918 Other nonspecific abnormal finding of lung field: Secondary | ICD-10-CM | POA: Diagnosis not present

## 2022-12-15 DIAGNOSIS — J432 Centrilobular emphysema: Secondary | ICD-10-CM | POA: Diagnosis not present

## 2022-12-15 DIAGNOSIS — R0600 Dyspnea, unspecified: Secondary | ICD-10-CM | POA: Diagnosis not present

## 2022-12-15 DIAGNOSIS — J9611 Chronic respiratory failure with hypoxia: Secondary | ICD-10-CM | POA: Diagnosis not present

## 2022-12-15 DIAGNOSIS — I272 Pulmonary hypertension, unspecified: Secondary | ICD-10-CM | POA: Diagnosis not present

## 2022-12-15 DIAGNOSIS — R739 Hyperglycemia, unspecified: Secondary | ICD-10-CM | POA: Diagnosis not present

## 2022-12-15 DIAGNOSIS — G3184 Mild cognitive impairment, so stated: Secondary | ICD-10-CM | POA: Diagnosis not present

## 2022-12-15 DIAGNOSIS — Z9981 Dependence on supplemental oxygen: Secondary | ICD-10-CM | POA: Diagnosis not present

## 2022-12-15 DIAGNOSIS — R0689 Other abnormalities of breathing: Secondary | ICD-10-CM | POA: Diagnosis not present

## 2022-12-16 DIAGNOSIS — E119 Type 2 diabetes mellitus without complications: Secondary | ICD-10-CM | POA: Diagnosis not present

## 2022-12-16 DIAGNOSIS — J439 Emphysema, unspecified: Secondary | ICD-10-CM | POA: Diagnosis not present

## 2022-12-16 DIAGNOSIS — I2510 Atherosclerotic heart disease of native coronary artery without angina pectoris: Secondary | ICD-10-CM | POA: Diagnosis not present

## 2022-12-16 DIAGNOSIS — Z7682 Awaiting organ transplant status: Secondary | ICD-10-CM | POA: Diagnosis not present

## 2022-12-16 DIAGNOSIS — E785 Hyperlipidemia, unspecified: Secondary | ICD-10-CM | POA: Diagnosis not present

## 2022-12-16 DIAGNOSIS — I10 Essential (primary) hypertension: Secondary | ICD-10-CM | POA: Diagnosis not present

## 2022-12-16 DIAGNOSIS — Z01810 Encounter for preprocedural cardiovascular examination: Secondary | ICD-10-CM | POA: Diagnosis not present

## 2022-12-16 DIAGNOSIS — H439 Unspecified disorder of vitreous body: Secondary | ICD-10-CM | POA: Diagnosis not present

## 2022-12-27 DIAGNOSIS — U071 COVID-19: Secondary | ICD-10-CM | POA: Diagnosis not present

## 2022-12-27 DIAGNOSIS — I43 Cardiomyopathy in diseases classified elsewhere: Secondary | ICD-10-CM | POA: Diagnosis not present

## 2023-01-04 ENCOUNTER — Emergency Department (HOSPITAL_COMMUNITY): Payer: Medicare Other

## 2023-01-04 ENCOUNTER — Encounter (HOSPITAL_COMMUNITY): Payer: Self-pay | Admitting: *Deleted

## 2023-01-04 ENCOUNTER — Other Ambulatory Visit: Payer: Self-pay

## 2023-01-04 ENCOUNTER — Emergency Department (HOSPITAL_COMMUNITY)
Admission: EM | Admit: 2023-01-04 | Discharge: 2023-01-04 | Disposition: A | Discharge status: Home / Self Care | Payer: Medicare Other | Attending: Emergency Medicine | Admitting: Emergency Medicine

## 2023-01-04 DIAGNOSIS — Z7951 Long term (current) use of inhaled steroids: Secondary | ICD-10-CM | POA: Diagnosis not present

## 2023-01-04 DIAGNOSIS — I509 Heart failure, unspecified: Secondary | ICD-10-CM | POA: Insufficient documentation

## 2023-01-04 DIAGNOSIS — J449 Chronic obstructive pulmonary disease, unspecified: Secondary | ICD-10-CM | POA: Insufficient documentation

## 2023-01-04 DIAGNOSIS — D649 Anemia, unspecified: Secondary | ICD-10-CM | POA: Insufficient documentation

## 2023-01-04 DIAGNOSIS — R6889 Other general symptoms and signs: Secondary | ICD-10-CM | POA: Diagnosis not present

## 2023-01-04 DIAGNOSIS — R0789 Other chest pain: Secondary | ICD-10-CM | POA: Diagnosis not present

## 2023-01-04 DIAGNOSIS — Z743 Need for continuous supervision: Secondary | ICD-10-CM | POA: Diagnosis not present

## 2023-01-04 DIAGNOSIS — R590 Localized enlarged lymph nodes: Secondary | ICD-10-CM | POA: Diagnosis not present

## 2023-01-04 DIAGNOSIS — I10 Essential (primary) hypertension: Secondary | ICD-10-CM | POA: Diagnosis not present

## 2023-01-04 DIAGNOSIS — R079 Chest pain, unspecified: Secondary | ICD-10-CM | POA: Diagnosis not present

## 2023-01-04 LAB — CBC
HCT: 37.7 % — ABNORMAL LOW (ref 39.0–52.0)
Hemoglobin: 12.1 g/dL — ABNORMAL LOW (ref 13.0–17.0)
MCH: 29.9 pg (ref 26.0–34.0)
MCHC: 32.1 g/dL (ref 30.0–36.0)
MCV: 93.1 fL (ref 80.0–100.0)
Platelets: 245 10*3/uL (ref 150–400)
RBC: 4.05 MIL/uL — ABNORMAL LOW (ref 4.22–5.81)
RDW: 12.8 % (ref 11.5–15.5)
WBC: 5 10*3/uL (ref 4.0–10.5)
nRBC: 0 % (ref 0.0–0.2)

## 2023-01-04 LAB — BASIC METABOLIC PANEL
Anion gap: 6 (ref 5–15)
BUN: 18 mg/dL (ref 8–23)
CO2: 31 mmol/L (ref 22–32)
Calcium: 9.1 mg/dL (ref 8.9–10.3)
Chloride: 101 mmol/L (ref 98–111)
Creatinine, Ser: 1.17 mg/dL (ref 0.61–1.24)
GFR, Estimated: >60 mL/min (ref >60)
Glucose, Bld: 104 mg/dL — ABNORMAL HIGH (ref 70–99)
Potassium: 3.8 mmol/L (ref 3.5–5.1)
Sodium: 138 mmol/L (ref 135–145)

## 2023-01-04 LAB — BRAIN NATRIURETIC PEPTIDE: B Natriuretic Peptide: 33.1 pg/mL (ref 0.0–100.0)

## 2023-01-04 LAB — TROPONIN I (HIGH SENSITIVITY)
Troponin I (High Sensitivity): 7 ng/L (ref <18)
Troponin I (High Sensitivity): 7 ng/L (ref <18)

## 2023-01-04 MED ORDER — IOHEXOL 350 MG/ML SOLN
100.0000 mL | Freq: Once | INTRAVENOUS | Status: AC | PRN
  Administered 2023-01-04: 75 mL via INTRAVENOUS

## 2023-01-04 MED ORDER — SACUBITRIL-VALSARTAN 97-103 MG PO TABS: 1.0000 | ORAL_TABLET | Freq: Two times a day (BID) | ORAL | 0 refills | Status: AC

## 2023-01-04 MED ORDER — SODIUM CHLORIDE (PF) 0.9 % IJ SOLN
INTRAMUSCULAR | Status: AC
  Filled 2023-01-04: qty 50

## 2023-01-04 NOTE — ED Provider Notes (Signed)
[place] EMERGENCY DEPARTMENT AT [facility] Provider Note   CSN: [id number] Arrival date & time: 01/04/23  0451     History  Chief Complaint  Patient presents with   Chest Pain    Gerald Williams is a 72 y.o. male.  Patient presents to the emergency department via EMS complaining of left-sided chest pain which began Monday at approximately 5 PM.  Patient states that he was sitting on the couch when the pain began suddenly.  He denies any aggravating or alleviating factors.  He endorses radiation into the jaw and down the left arm.  Pain is described as sharp and was initially rated as 8 out of 10 in severity.  Patient also endorses having to increase his oxygen usage at home.  He has baseline oxygen is 5 to 6 L.  He has been using 8 L at home to maintain oxygen saturations due to increased shortness of breath.  EMS reported initial blood pressure of 180/100, administered 1 dose of nitroglycerin and 324 mg of aspirin.  Patient's blood pressure decreased to 120/90 after the initial dose of nitroglycerin.  Pain improved to a 6 out of 10 in severity.  The patient does say that his shortness of breath increases with exertion.  He denies missing any medications.   Chest Pain      Home Medications Prior to Admission medications   Medication Sig Start Date End Date Taking? Authorizing Provider  acetaminophen (TYLENOL) 500 MG tablet Take 1,000 mg by mouth every 6 (six) hours as needed for mild pain, fever or headache.   Yes [provider]  DULoxetine (CYMBALTA) 60 MG capsule Take 60 mg by mouth 2 (two) times daily. 09/15/20  Yes [provider]  fluticasone (FLONASE) 50 MCG/ACT nasal spray Place 2 sprays into both nostrils daily as needed for allergies. 12/29/20  Yes [provider]  ipratropium (ATROVENT) 0.03 % nasal spray Place 1 spray into both nostrils every 8 (eight) hours as needed for rhinitis. 01/06/21  Yes [name], MD  sacubitril-valsartan  (ENTRESTO) 97-103 MG Take 1 tablet by mouth 2 (two) times daily. 05/05/22  Yes [name], [name], MD  SYMBICORT 160-4.5 MCG/ACT inhaler Inhale 2 puffs into the lungs in the morning and at bedtime. 05/24/22  Yes [name], MD  albuterol (PROVENTIL) (2.5 MG/3ML) 0.083% nebulizer solution Take 2.5 mg by nebulization every 6 (six) hours as needed for wheezing or shortness of breath.    [provider]  atorvastatin (LIPITOR) 80 MG tablet Take 80 mg by mouth daily. Patient not taking: Reported on 01/04/2023 05/12/20   [provider]  diclofenac Sodium (VOLTAREN) 1 % GEL Apply 2 g topically 4 (four) times daily. Patient not taking: Reported on 01/04/2023 01/01/21   Particia Nearing, PA-C  furosemide (LASIX) 20 MG tablet Take 1 tablet (20 mg total) by mouth daily. Patient not taking: Reported on 01/04/2023 05/05/22   Sande Rives, MD  glucose blood (COOL BLOOD GLUCOSE TEST STRIPS) test strip Used to test blood sugar x2 daily---diagnosis code r73.03--for one touch verio flex 12/08/15   Burns, [name], MD  Ipratropium-Albuterol (COMBIVENT RESPIMAT) 20-100 MCG/ACT AERS respimat Inhale 1 puff into the lungs every 6 (six) hours as needed for shortness of breath. Patient not taking: Reported on 01/04/2023 05/24/22   [name], MD  Lancets MISC Used to test blood sugar x2 daily---diagnosis code r73.03--for one touch verio flex 12/08/15   [name], MD  metoprolol succinate (  TOPROL-XL) 50 MG 24 hr tablet Take 1 tablet (50 mg total) by mouth daily. Take with or immediately following a meal. 05/05/22   [name], [name], MD  mirtazapine (REMERON) 30 MG tablet Take 30 mg by mouth at bedtime. Patient not taking: Reported on 01/04/2023    [provider]  OXYGEN Inhale 4-5 L into the lungs continuous.    [provider]  polyethylene glycol (MIRALAX) 17 g packet Take 17 g by mouth daily. 03/13/22   Burnadette Pop, MD  prazosin (MINIPRESS) 2 MG capsule Take  2 mg by mouth at bedtime. 08/23/21   [provider]  prazosin (MINIPRESS) 5 MG capsule Take 1 capsule by mouth at bedtime. 08/23/21   [provider]  predniSONE (DELTASONE) 10 MG tablet Take 1 tablet (10 mg total) by mouth daily with breakfast. Take 4 pills for 3 days and then 3 pills for 3 days then 2 pills for 3 days then 1 pill for 3 days then stop Patient not taking: Reported on 01/04/2023 03/13/22   Burnadette Pop, MD  spironolactone (ALDACTONE) 25 MG tablet Take 1 tablet (25 mg total) by mouth daily. Patient not taking: Reported on 01/04/2023 05/05/22 04/30/23  Sande Rives, MD      Allergies    Tiotropium, Gabapentin, Nortriptyline, Budeson-glycopyrrol-formoterol, Olodaterol, Oxcarbazepine, and Tiotropium bromide-olodaterol    Review of Systems   Review of Systems  Cardiovascular:  Positive for chest pain.    Physical Exam Updated Vital Signs BP 138/86   Pulse 77   Temp 98.2 F (36.8 C)   Resp 17   Ht 5\' 5"  (1.651 m)   Wt 74.8 kg   SpO2 96%   BMI 27.46 kg/m  Physical Exam Vitals and nursing note reviewed.  Constitutional:      General: He is not in acute distress.    Appearance: He is well-developed.  HENT:     Head: Normocephalic and atraumatic.  Eyes:     Conjunctiva/sclera: Conjunctivae normal.  Cardiovascular:     Rate and Rhythm: Normal rate and regular rhythm.  Pulmonary:     Effort: Pulmonary effort is normal. No respiratory distress.     Breath sounds: Normal breath sounds.  Chest:     Chest wall: No tenderness.  Abdominal:     Palpations: Abdomen is soft.     Tenderness: There is no abdominal tenderness.  Musculoskeletal:        General: No swelling.     Cervical back: Neck supple.     Right lower leg: No edema.     Left lower leg: No edema.  Skin:    General: Skin is warm and dry.     Capillary Refill: Capillary refill takes less than 2 seconds.  Neurological:     Mental Status: He is alert.  Psychiatric:        Mood and  Affect: Mood normal.     ED Results / Procedures / Treatments   Labs (all labs ordered are listed, but only abnormal results are displayed) Labs Reviewed  BASIC METABOLIC PANEL - Abnormal; Notable for the following components:      Result Value   Glucose, Bld 104 (*)    All other components within normal limits  CBC - Abnormal; Notable for the following components:   RBC 4.05 (*)    Hemoglobin 12.1 (*)    HCT 37.7 (*)    All other components within normal limits  BRAIN NATRIURETIC PEPTIDE  TROPONIN I (HIGH SENSITIVITY)  TROPONIN I (HIGH SENSITIVITY)    EKG EKG Interpretation Date/Time:  Wednesday January 04 2023 05:00:16 EDT Ventricular Rate:  75 PR Interval:  126 QRS Duration:  82 QT Interval:  376 QTC Calculation: 420 R Axis:   34  Text Interpretation: Sinus rhythm Atrial premature complexes Borderline T wave abnormalities Confirmed by Gilda Crease ([id number]) on 01/04/2023 5:16:48 AM  Radiology DG Chest Port 1 View  Result Date: 01/04/2023 CLINICAL DATA:  Chest pain EXAM: PORTABLE CHEST 1 VIEW COMPARISON:  03/11/2022 FINDINGS: Lungs are hyperexpanded. Interstitial markings are diffusely coarsened with basilar predominance. The cardiopericardial silhouette is within normal limits for size. No acute bony abnormality. Telemetry leads overlie the chest. IMPRESSION: Hyperexpansion with basilar interstitial coarsening. Component of dependent interstitial edema not excluded. Electronically Signed   By: Kennith Center M.D.   On: 01/04/2023 06:02    Procedures Procedures    Medications Ordered in ED Medications  iohexol (OMNIPAQUE) 350 MG/ML injection 100 mL (75 mLs Intravenous Contrast Given 01/04/23 0615)    ED Course/ Medical Decision Making/ A&P                                 Medical Decision Making Amount and/or Complexity of Data Reviewed Labs: ordered. Radiology: ordered.  Risk Prescription drug management.   This patient presents to the ED for  concern of chest pain, this involves an extensive number of treatment options, and is a complaint that carries with it a high risk of complications and morbidity.  The differential diagnosis includes ACS, angina, pneumonia, PE, musculoskeletal pain, others   Co morbidities that complicate the patient evaluation  Previous MI, CHF, COPD, patient on lung transplant list   Additional history obtained:  Additional history obtained from EMS External records from outside source obtained and reviewed including cardiology notes   Lab Tests:  I Ordered, and personally interpreted labs.  The pertinent results include: Initial troponin 7, hemoglobin 12.1, BNP 33.1   Imaging Studies ordered:  I ordered imaging studies including chest x-ray and CT angio chest PE study I independently visualized and interpreted imaging which showed  Hyperexpansion with basilar interstitial coarsening. Component of  dependent interstitial edema not excluded.   I agree with the radiologist interpretation   Cardiac Monitoring: / EKG:  The patient was maintained on a cardiac monitor.  I personally viewed and interpreted the cardiac monitored which showed an underlying rhythm of: sinus rhythm    Test / Admission - Considered:  Patient care being transferred to [facility], PA-C. Disposition pending CT angio PE study, delta troponins, ambulation while on pulse oximetry.          Final Clinical Impression(s) / ED Diagnoses Final diagnoses:  None    Rx / DC Orders ED Discharge Orders     None         Pamala Duffel 01/04/23 [id number]    Gilda Crease, MD 01/04/23 2358

## 2023-01-04 NOTE — Discharge Instructions (Addendum)
Your workup today was reassuring.  No concerning cause of your chest pain.  Follow-up with your primary care provider.  Have also given your follow-up to cardiologist for the chest pain.  Also recommend you follow-up with your pulmonologist.  If you have any concerning symptoms return to the emergency room.  You were able to walk around emergency department with 6 L of oxygen and maintain greater than 90% of oxygen which is baseline for you.  There are a couple findings on your CT scan that need to be monitored by your primary care doctor or your lung doctor.  There is a new pulmonary nodule that needs to be monitored.  There is a lymph node that is enlarged.  And there is a potential lesion on the left kidney.  Your primary care provider can order an ultrasound to further evaluate this.

## 2023-01-04 NOTE — ED Notes (Signed)
Patient ambulated. O2 sat 90-92 and HR 90s.

## 2023-01-04 NOTE — ED Triage Notes (Signed)
PER EMS REPORT:   Pt arrives via [facility] from home. Left sided chest pain,since yesterday,  radiates to shoulder/arm, "dull", he has increased o2 up to 8 liters over the past several days, baseline is normally 6 liters. On lung transplant list for COPD. Hx of CHF, COPD. NSR, 180/100, now 120/90 after 1 nitroglycerin and also given 4 asa, lung sounds clear. 96% on 6 liters, SOB increased with exertion. CBG 110.

## 2023-01-04 NOTE — ED Provider Notes (Signed)
Signout received on this 72 year old male who presents for chest pain.  Currently awaiting CT angio study.  Blood work overall reassuring.  Mild anemia 12.1.  No leukocytosis.  BMP grossly unremarkable.  BNP within normal.  Troponin negative x 2.  Chest x-ray without acute concern.  Physical Exam  BP (!) 143/92   Pulse 64   Temp 98.2 F (36.8 C)   Resp 20   Ht 5\' 5"  (1.651 m)   Wt 74.8 kg   SpO2 98%   BMI 27.46 kg/m     Procedures  Procedures  ED Course / MDM    Medical Decision Making Amount and/or Complexity of Data Reviewed Labs: ordered. Radiology: ordered.  Risk Prescription drug management.   CT angio study without acute concern.  Particularly no PE.  There is a new subcentimeter pulmonary nodule.  Can be followed up by pulmonologist.  Patient made aware.  Will do an ambulation trial.  Patient ambulated without desaturation.  Maintain his baseline supplemental O2 of 6 L.  Chest pain significantly improved.  Cardiology referral given.        Marita Kansas, PA-C 01/04/23 [id number]    Linwood Dibbles, MD 01/06/23 [phone]

## 2023-01-04 NOTE — ED Triage Notes (Signed)
Pt reporting onset of chest pain yesterday, left sided, sharp, radiates to the left shoulder. Increasing SOB at home and has increased his oxygen at home to around 8 liters. Did have improvement of pain with nitroglycerin administered by EMS.

## 2023-01-16 DIAGNOSIS — Z01818 Encounter for other preprocedural examination: Secondary | ICD-10-CM | POA: Diagnosis not present

## 2023-01-16 DIAGNOSIS — Z7682 Awaiting organ transplant status: Secondary | ICD-10-CM | POA: Diagnosis not present

## 2023-01-16 DIAGNOSIS — K6389 Other specified diseases of intestine: Secondary | ICD-10-CM | POA: Diagnosis not present

## 2023-02-02 DIAGNOSIS — Z7682 Awaiting organ transplant status: Secondary | ICD-10-CM | POA: Diagnosis not present

## 2023-02-02 DIAGNOSIS — R262 Difficulty in walking, not elsewhere classified: Secondary | ICD-10-CM | POA: Diagnosis not present

## 2023-02-02 DIAGNOSIS — R1312 Dysphagia, oropharyngeal phase: Secondary | ICD-10-CM | POA: Diagnosis not present

## 2023-02-03 DIAGNOSIS — R262 Difficulty in walking, not elsewhere classified: Secondary | ICD-10-CM | POA: Diagnosis not present

## 2023-02-03 DIAGNOSIS — Z7682 Awaiting organ transplant status: Secondary | ICD-10-CM | POA: Diagnosis not present

## 2023-02-06 DIAGNOSIS — Z7682 Awaiting organ transplant status: Secondary | ICD-10-CM | POA: Diagnosis not present

## 2023-02-06 DIAGNOSIS — R262 Difficulty in walking, not elsewhere classified: Secondary | ICD-10-CM | POA: Diagnosis not present

## 2023-02-07 DIAGNOSIS — R262 Difficulty in walking, not elsewhere classified: Secondary | ICD-10-CM | POA: Diagnosis not present

## 2023-02-07 DIAGNOSIS — Z7682 Awaiting organ transplant status: Secondary | ICD-10-CM | POA: Diagnosis not present

## 2023-02-08 DIAGNOSIS — Z7682 Awaiting organ transplant status: Secondary | ICD-10-CM | POA: Diagnosis not present

## 2023-02-08 DIAGNOSIS — R262 Difficulty in walking, not elsewhere classified: Secondary | ICD-10-CM | POA: Diagnosis not present

## 2023-02-09 DIAGNOSIS — R1312 Dysphagia, oropharyngeal phase: Secondary | ICD-10-CM | POA: Diagnosis not present

## 2023-02-09 DIAGNOSIS — J449 Chronic obstructive pulmonary disease, unspecified: Secondary | ICD-10-CM | POA: Diagnosis not present

## 2023-02-09 DIAGNOSIS — D649 Anemia, unspecified: Secondary | ICD-10-CM | POA: Diagnosis not present

## 2023-02-09 DIAGNOSIS — R1311 Dysphagia, oral phase: Secondary | ICD-10-CM | POA: Diagnosis not present

## 2023-02-09 DIAGNOSIS — Z7682 Awaiting organ transplant status: Secondary | ICD-10-CM | POA: Diagnosis not present

## 2023-02-10 DIAGNOSIS — R0602 Shortness of breath: Secondary | ICD-10-CM | POA: Diagnosis not present

## 2023-02-10 DIAGNOSIS — D649 Anemia, unspecified: Secondary | ICD-10-CM | POA: Diagnosis not present

## 2023-02-10 DIAGNOSIS — Z7682 Awaiting organ transplant status: Secondary | ICD-10-CM | POA: Diagnosis not present

## 2023-02-10 DIAGNOSIS — J439 Emphysema, unspecified: Secondary | ICD-10-CM | POA: Diagnosis not present

## 2023-02-10 DIAGNOSIS — Z79899 Other long term (current) drug therapy: Secondary | ICD-10-CM | POA: Diagnosis not present

## 2023-02-10 DIAGNOSIS — G4733 Obstructive sleep apnea (adult) (pediatric): Secondary | ICD-10-CM | POA: Diagnosis not present

## 2023-02-10 DIAGNOSIS — Z23 Encounter for immunization: Secondary | ICD-10-CM | POA: Diagnosis not present

## 2023-02-10 DIAGNOSIS — Z01818 Encounter for other preprocedural examination: Secondary | ICD-10-CM | POA: Diagnosis not present

## 2023-02-10 DIAGNOSIS — Z87891 Personal history of nicotine dependence: Secondary | ICD-10-CM | POA: Diagnosis not present

## 2023-02-10 DIAGNOSIS — J9611 Chronic respiratory failure with hypoxia: Secondary | ICD-10-CM | POA: Diagnosis not present

## 2023-02-10 DIAGNOSIS — I272 Pulmonary hypertension, unspecified: Secondary | ICD-10-CM | POA: Diagnosis not present

## 2023-02-10 DIAGNOSIS — R262 Difficulty in walking, not elsewhere classified: Secondary | ICD-10-CM | POA: Diagnosis not present

## 2023-02-13 DIAGNOSIS — Z7682 Awaiting organ transplant status: Secondary | ICD-10-CM | POA: Diagnosis not present

## 2023-02-13 DIAGNOSIS — R262 Difficulty in walking, not elsewhere classified: Secondary | ICD-10-CM | POA: Diagnosis not present

## 2023-02-14 DIAGNOSIS — Z7682 Awaiting organ transplant status: Secondary | ICD-10-CM | POA: Diagnosis not present

## 2023-02-14 DIAGNOSIS — R262 Difficulty in walking, not elsewhere classified: Secondary | ICD-10-CM | POA: Diagnosis not present

## 2023-02-15 DIAGNOSIS — Z7682 Awaiting organ transplant status: Secondary | ICD-10-CM | POA: Diagnosis not present

## 2023-02-15 DIAGNOSIS — R262 Difficulty in walking, not elsewhere classified: Secondary | ICD-10-CM | POA: Diagnosis not present

## 2023-02-16 DIAGNOSIS — Z7682 Awaiting organ transplant status: Secondary | ICD-10-CM | POA: Diagnosis not present

## 2023-02-16 DIAGNOSIS — R262 Difficulty in walking, not elsewhere classified: Secondary | ICD-10-CM | POA: Diagnosis not present

## 2023-02-19 DIAGNOSIS — D508 Other iron deficiency anemias: Secondary | ICD-10-CM | POA: Diagnosis not present

## 2023-02-21 ENCOUNTER — Emergency Department (HOSPITAL_COMMUNITY)
Admission: EM | Admit: 2023-02-21 | Discharge: 2023-02-21 | Disposition: A | Discharge status: Home / Self Care | Payer: No Typology Code available for payment source | Attending: Emergency Medicine | Admitting: Emergency Medicine

## 2023-02-21 ENCOUNTER — Other Ambulatory Visit: Payer: Self-pay

## 2023-02-21 ENCOUNTER — Encounter (HOSPITAL_COMMUNITY): Payer: Self-pay

## 2023-02-21 DIAGNOSIS — M436 Torticollis: Secondary | ICD-10-CM | POA: Insufficient documentation

## 2023-02-21 DIAGNOSIS — I110 Hypertensive heart disease with heart failure: Secondary | ICD-10-CM | POA: Insufficient documentation

## 2023-02-21 DIAGNOSIS — J449 Chronic obstructive pulmonary disease, unspecified: Secondary | ICD-10-CM | POA: Insufficient documentation

## 2023-02-21 DIAGNOSIS — M542 Cervicalgia: Secondary | ICD-10-CM | POA: Diagnosis present

## 2023-02-21 DIAGNOSIS — I5021 Acute systolic (congestive) heart failure: Secondary | ICD-10-CM | POA: Diagnosis not present

## 2023-02-21 MED ORDER — METHOCARBAMOL 500 MG PO TABS
500.0000 mg | ORAL_TABLET | Freq: Two times a day (BID) | ORAL | 0 refills | Status: DC
Start: 2023-02-21 — End: 2024-01-24

## 2023-02-21 MED ORDER — KETOROLAC TROMETHAMINE 60 MG/2ML IM SOLN
30.0000 mg | Freq: Once | INTRAMUSCULAR | Status: AC
  Administered 2023-02-21: 30 mg via INTRAMUSCULAR
  Filled 2023-02-21: qty 2

## 2023-02-21 MED ORDER — NAPROXEN 375 MG PO TABS: 375.0000 mg | ORAL_TABLET | Freq: Two times a day (BID) | ORAL | 0 refills | Status: AC

## 2023-02-21 NOTE — Discharge Instructions (Addendum)
In addition to prescribed medication, you can take Acetaminophen (Tylenol), topical muscle creams such as SalonPas, [employer]og[employer], Bengay, etc. Please stretch, apply ice or heat (whichever helps), and have massage therapy for additional assistance.

## 2023-02-21 NOTE — ED Triage Notes (Signed)
Patient arrives reporting a crook in his neck. States it started yesterday. States he may have slept the wrong way. Took Icey-hot and tylenol for the pain with no relief. Patient arrives on [address]L [place]

## 2023-02-21 NOTE — ED Provider Notes (Signed)
[place] EMERGENCY DEPARTMENT AT [facility] Provider Note  CSN: [id number] Arrival date & time: 02/21/23 0254  Chief Complaint(s) Torticollis  HPI Gerald Williams is a 72 y.o. male with a past medical history listed below who presents to the emergency department with several days of left-sided neck pain.  Patient reports waking up 1 morning with the neck pain is gradually gotten worse.  Pain is fluctuating in intensity.  Worse with turning his head and ranging his neck.  Also tender to palpation.  He denies any recent fevers or infections.  No coughing or congestion.  No nausea or vomiting.  Physical complaints.  Patient has tried taking Tylenol and is placed topical muscle creams without relief.  He did report that he does seem to help with pain.  HPI  Past Medical History Past Medical History:  Diagnosis Date   CHF (congestive heart failure) (HCC)    Emphysema    Erectile dysfunction    Hyperlipemia    Hypertension    Polysubstance abuse (HCC)    stopped using crack/cocaine December 4th 20-14   Pre-diabetes    Sleep apnea    Patient Active Problem List   Diagnosis Date Noted   COVID-19 virus infection 03/11/2022   Acute on chronic hypoxic respiratory failure (HCC) 03/11/2022   Solitary pulmonary nodule on lung CT 04/30/2021   Pleuritic chest pain 11/20/2020   Angina at rest [facility]) 10/30/2020   Acute systolic heart failure (HCC)    Chronic respiratory failure with hypoxia (HCC) 09/18/2018   Exercise hypoxemia 11/07/2017   Frequent headaches 05/23/2017   Acute pain of right knee 12/23/2016   Old tear of lateral meniscus of right knee 12/23/2016   Blood in stool 10/05/2015   HTN (hypertension) 06/18/2015   Cephalalgia 06/18/2015   Dyslipidemia 06/12/2015   COPD GOLD III 05/28/2015   Peripheral neuropathy 05/20/2015   COPD (chronic obstructive pulmonary disease) with emphysema (HCC) 05/20/2015   PTSD (post-traumatic stress disorder) 05/20/2015   Right carotid  bruit 05/20/2015   Frequent urination 05/20/2015   Hematuria 07/15/2014   History of tobacco abuse:  quit in ~ 06/2013  07/14/2014   OSA on CPAP 07/14/2014   Pre-diabetes 07/14/2014   Claudication, R>L 07/14/2014   Cocaine use, history of 03/26/2011   Chest pain 03/26/2011   Home Medication(s) Prior to Admission medications   Medication Sig Start Date End Date Taking? Authorizing Provider  methocarbamol (ROBAXIN) 500 MG tablet Take 1-2 tablets (500-1,000 mg total) by mouth 2 (two) times daily. 02/21/23  Yes Nayomi Tabron, Amadeo Garnet, MD  naproxen (NAPROSYN) 375 MG tablet Take 1 tablet (375 mg total) by mouth 2 (two) times daily. 02/21/23  Yes Dekendrick Uzelac, Amadeo Garnet, MD  acetaminophen (TYLENOL) 500 MG tablet Take 1,000 mg by mouth every 6 (six) hours as needed for mild pain, fever or headache.    [provider]  albuterol (PROVENTIL) (2.5 MG/3ML) 0.083% nebulizer solution Take 2.5 mg by nebulization every 6 (six) hours as needed for wheezing or shortness of breath.    [provider]  atorvastatin (LIPITOR) 80 MG tablet Take 80 mg by mouth daily. Patient not taking: Reported on 01/04/2023 05/12/20   [provider]  diclofenac Sodium (VOLTAREN) 1 % GEL Apply 2 g topically 4 (four) times daily. Patient not taking: Reported on 01/04/2023 01/01/21   Particia Nearing, PA-C  DULoxetine (CYMBALTA) 60 MG capsule Take 60 mg by mouth 2 (two) times daily. 09/15/20   [provider]  fluticasone (FLONASE) 50  MCG/ACT nasal spray Place 2 sprays into both nostrils daily as needed for allergies. 12/29/20   [provider]  furosemide (LASIX) 20 MG tablet Take 1 tablet (20 mg total) by mouth daily. Patient not taking: Reported on 01/04/2023 05/05/22   Sande Rives, MD  glucose blood (COOL BLOOD GLUCOSE TEST STRIPS) test strip Used to test blood sugar x2 daily---diagnosis code r73.03--for one touch verio flex 12/08/15   [name], MD  ipratropium (ATROVENT)  0.03 % nasal spray Place 1 spray into both nostrils every 8 (eight) hours as needed for rhinitis. 01/06/21   [name], MD  Ipratropium-Albuterol (COMBIVENT RESPIMAT) 20-100 MCG/ACT AERS respimat Inhale 1 puff into the lungs every 6 (six) hours as needed for shortness of breath. Patient not taking: Reported on 01/04/2023 05/24/22   [name], MD  Lancets MISC Used to test blood sugar x2 daily---diagnosis code r73.03--for one touch verio flex 12/08/15   [name], MD  metoprolol succinate (TOPROL-XL) 50 MG 24 hr tablet Take 1 tablet (50 mg total) by mouth daily. Take with or immediately following a meal. 05/05/22   [name], [name], MD  mirtazapine (REMERON) 30 MG tablet Take 30 mg by mouth at bedtime. Patient not taking: Reported on 01/04/2023    [provider]  OXYGEN Inhale 4-5 L into the lungs continuous.    [provider]  polyethylene glycol (MIRALAX) 17 g packet Take 17 g by mouth daily. 03/13/22   Burnadette Pop, MD  prazosin (MINIPRESS) 2 MG capsule Take 2 mg by mouth at bedtime. 08/23/21   [provider]  prazosin (MINIPRESS) 5 MG capsule Take 1 capsule by mouth at bedtime. 08/23/21   [provider]  predniSONE (DELTASONE) 10 MG tablet Take 1 tablet (10 mg total) by mouth daily with breakfast. Take 4 pills for 3 days and then 3 pills for 3 days then 2 pills for 3 days then 1 pill for 3 days then stop Patient not taking: Reported on 01/04/2023 03/13/22   Burnadette Pop, MD  sacubitril-valsartan (ENTRESTO) 97-103 MG Take 1 tablet by mouth 2 (two) times daily. 01/04/23   Marita Kansas, PA-C  spironolactone (ALDACTONE) 25 MG tablet Take 1 tablet (25 mg total) by mouth daily. Patient not taking: Reported on 01/04/2023 05/05/22 04/30/23  Sande Rives, MD  SYMBICORT 160-4.5 MCG/ACT inhaler Inhale 2 puffs into the lungs in the morning and at bedtime. 05/24/22   [name], MD                                                                                                                                     Allergies Tiotropium, Gabapentin, Nortriptyline, Budeson-glycopyrrol-formoterol, Olodaterol, Oxcarbazepine, and Tiotropium bromide-olodaterol  Review of Systems Review of Systems As noted in HPI  Physical Exam Vital Signs  I have reviewed the triage vital signs BP (!) 149/93   Pulse 78   Temp  98.3 F (36.8 C) (Oral)   Resp (!) 24   Ht 5\' 5"  (1.651 m)   Wt 73 kg   SpO2 99%   BMI 26.79 kg/m   Physical Exam Vitals reviewed.  Constitutional:      General: He is not in acute distress.    Appearance: He is well-developed. He is not diaphoretic.  HENT:     Head: Normocephalic and atraumatic.     Right Ear: External ear normal.     Left Ear: External ear normal.     Nose: Nose normal.     Mouth/Throat:     Mouth: Mucous membranes are moist.  Eyes:     General: No scleral icterus.    Conjunctiva/sclera: Conjunctivae normal.  Neck:     Trachea: Phonation normal.   Cardiovascular:     Rate and Rhythm: Normal rate and regular rhythm.  Pulmonary:     Effort: Pulmonary effort is normal. No respiratory distress.     Breath sounds: No stridor.  Abdominal:     General: There is no distension.  Musculoskeletal:        General: Normal range of motion.     Cervical back: Normal range of motion. Torticollis present.  Neurological:     Mental Status: He is alert and oriented to person, place, and time.  Psychiatric:        Behavior: Behavior normal.     ED Results and Treatments Labs (all labs ordered are listed, but only abnormal results are displayed) Labs Reviewed - No data to display                                                                                                                       EKG  EKG Interpretation Date/Time:    Ventricular Rate:    PR Interval:    QRS Duration:    QT Interval:    QTC Calculation:   R Axis:      Text Interpretation:         Radiology No results  found.  Medications Ordered in ED Medications  ketorolac (TORADOL) injection 30 mg (30 mg Intramuscular Given 02/21/23 0559)   Procedures Procedures  (including critical care time) Medical Decision Making / ED Course   Medical Decision Making Risk Prescription drug management.    Patient presents for neck pain consistent with torticollis.  No infectious symptoms concerning for PTA or deep tissue infection.  Doubt acute vascular process requiring imaging.    Final Clinical Impression(s) / ED Diagnoses Final diagnoses:  Torticollis   The patient appears reasonably screened and/or stabilized for discharge and I doubt any other medical condition or other [facility] requiring further screening, evaluation, or treatment in the ED at this time. I have discussed the findings, Dx and Tx plan with the patient/family who expressed understanding and agree(s) with the plan. Discharge instructions discussed at length. The patient/family was given strict return precautions who verbalized understanding of the instructions. No further questions at time of  discharge.  Disposition: Discharge  Condition: Good  ED Discharge Orders          Ordered    methocarbamol (ROBAXIN) 500 MG tablet  2 times daily        02/21/23 0632    naproxen (NAPROSYN) 375 MG tablet  2 times daily        02/21/23 1610              Follow Up: Center, [facility] [address] [place] [address] [phone]  Call  to schedule an appointment for close follow up    This chart was dictated using voice recognition software.  Despite best efforts to proofread,  errors can occur which can change the documentation meaning.    Nira Conn, MD 02/21/23 [phone]

## 2023-02-27 DIAGNOSIS — D508 Other iron deficiency anemias: Secondary | ICD-10-CM | POA: Diagnosis not present

## 2023-02-27 DIAGNOSIS — I776 Arteritis, unspecified: Secondary | ICD-10-CM | POA: Diagnosis not present

## 2023-02-27 DIAGNOSIS — J9611 Chronic respiratory failure with hypoxia: Secondary | ICD-10-CM | POA: Diagnosis not present

## 2023-03-04 ENCOUNTER — Other Ambulatory Visit: Payer: Self-pay | Admitting: Internal Medicine

## 2023-03-07 ENCOUNTER — Other Ambulatory Visit: Payer: Self-pay | Admitting: Internal Medicine

## 2023-03-07 DIAGNOSIS — R768 Other specified abnormal immunological findings in serum: Secondary | ICD-10-CM | POA: Diagnosis not present

## 2023-03-07 DIAGNOSIS — I700 Atherosclerosis of aorta: Secondary | ICD-10-CM | POA: Diagnosis not present

## 2023-03-07 DIAGNOSIS — R935 Abnormal findings on diagnostic imaging of other abdominal regions, including retroperitoneum: Secondary | ICD-10-CM | POA: Diagnosis not present

## 2023-03-07 DIAGNOSIS — J432 Centrilobular emphysema: Secondary | ICD-10-CM | POA: Diagnosis not present

## 2023-03-07 DIAGNOSIS — J9611 Chronic respiratory failure with hypoxia: Secondary | ICD-10-CM | POA: Diagnosis not present

## 2023-03-07 DIAGNOSIS — I2510 Atherosclerotic heart disease of native coronary artery without angina pectoris: Secondary | ICD-10-CM | POA: Diagnosis not present

## 2023-03-07 DIAGNOSIS — I776 Arteritis, unspecified: Secondary | ICD-10-CM | POA: Diagnosis not present

## 2023-03-07 NOTE — Telephone Encounter (Signed)
Calling to have prescriptions sent in

## 2023-03-08 ENCOUNTER — Other Ambulatory Visit: Payer: Self-pay | Admitting: Internal Medicine

## 2023-03-08 DIAGNOSIS — R935 Abnormal findings on diagnostic imaging of other abdominal regions, including retroperitoneum: Secondary | ICD-10-CM | POA: Diagnosis not present

## 2023-03-08 DIAGNOSIS — R768 Other specified abnormal immunological findings in serum: Secondary | ICD-10-CM | POA: Diagnosis not present

## 2023-03-14 DIAGNOSIS — I700 Atherosclerosis of aorta: Secondary | ICD-10-CM | POA: Diagnosis not present

## 2023-03-25 ENCOUNTER — Other Ambulatory Visit: Payer: Self-pay | Admitting: Internal Medicine

## 2023-03-28 NOTE — Progress Notes (Unsigned)
  Cardiology Office Note:  .   Date:  03/28/2023  ID:  Gerald Williams, DOB Jul 19, 1950, MRN [id number] PCP: [facility] Providers Cardiologist:  Reatha Harps, MD { Click to update primary MD,subspecialty MD or APP then REFRESH:1}   History of Present Illness: .   Gerald Williams is a 72 y.o. male with history of severe COPD, CHF, pHTN, HLD, HTN who presents for follow-up. Seen in ER 12/2022 for CP.  BNP 33. Troponin 7 x 2.      Problem List Systolic HF -EF 35% 10/29/2020 -EF 45-50% 05/27/2021 -Normal LHC 10/30/2020 2. Pulmonary hypertension -PCWP 28 (Group 2) 3. COPD -Severe 4. HTN 5. HLD -T chol 244, HDL 79, LDL 147, TG 92    ROS: All other ROS reviewed and negative. Pertinent positives noted in the HPI.     Studies Reviewed: Marland Kitchen       Physical Exam:   VS:  There were no vitals taken for this visit.   Wt Readings from Last 3 Encounters:  02/21/23 161 lb (73 kg)  01/04/23 165 lb (74.8 kg)  05/05/22 173 lb (78.5 kg)    GEN: Well nourished, well developed in no acute distress NECK: No JVD; No carotid bruits CARDIAC: ***RRR, no murmurs, rubs, gallops RESPIRATORY:  Clear to auscultation without rales, wheezing or rhonchi  ABDOMEN: Soft, non-tender, non-distended EXTREMITIES:  No edema; No deformity  ASSESSMENT AND PLAN: .   ***    {Are you ordering a CV Procedure (e.g. stress test, cath, DCCV, TEE, etc)?   Press F2        :[id number]}   Follow-up: No follow-ups on file.  Time Spent with Patient: I have spent a total of *** minutes caring for this patient today face to face, ordering and reviewing labs/tests, reviewing prior records/medical history, examining the patient, establishing an assessment and plan, communicating results/findings to the patient/family, and documenting in the medical record.   Signed, Lenna Gilford. Flora Lipps, MD, [facility]  [facility]  [address] [place], [place] [address] [phone]  12:28  PM

## 2023-03-29 ENCOUNTER — Ambulatory Visit: Payer: Non-veteran care | Attending: Cardiovascular Disease | Admitting: Cardiovascular Disease

## 2023-03-29 DIAGNOSIS — I272 Pulmonary hypertension, unspecified: Secondary | ICD-10-CM

## 2023-03-29 DIAGNOSIS — I10 Essential (primary) hypertension: Secondary | ICD-10-CM

## 2023-03-29 DIAGNOSIS — I5022 Chronic systolic (congestive) heart failure: Secondary | ICD-10-CM

## 2023-04-04 DIAGNOSIS — I776 Arteritis, unspecified: Secondary | ICD-10-CM | POA: Diagnosis not present

## 2023-07-25 DIAGNOSIS — J9611 Chronic respiratory failure with hypoxia: Secondary | ICD-10-CM | POA: Diagnosis not present

## 2023-08-15 DIAGNOSIS — Z87891 Personal history of nicotine dependence: Secondary | ICD-10-CM | POA: Diagnosis not present

## 2023-08-15 DIAGNOSIS — J449 Chronic obstructive pulmonary disease, unspecified: Secondary | ICD-10-CM | POA: Diagnosis not present

## 2023-08-15 DIAGNOSIS — R0602 Shortness of breath: Secondary | ICD-10-CM | POA: Diagnosis not present

## 2023-08-15 DIAGNOSIS — J432 Centrilobular emphysema: Secondary | ICD-10-CM | POA: Diagnosis not present

## 2023-08-15 DIAGNOSIS — R0609 Other forms of dyspnea: Secondary | ICD-10-CM | POA: Diagnosis not present

## 2023-08-15 DIAGNOSIS — Z79899 Other long term (current) drug therapy: Secondary | ICD-10-CM | POA: Diagnosis not present

## 2023-08-28 DIAGNOSIS — Z0000 Encounter for general adult medical examination without abnormal findings: Secondary | ICD-10-CM | POA: Diagnosis not present

## 2023-08-28 DIAGNOSIS — E782 Mixed hyperlipidemia: Secondary | ICD-10-CM | POA: Diagnosis not present

## 2023-08-28 DIAGNOSIS — D508 Other iron deficiency anemias: Secondary | ICD-10-CM | POA: Diagnosis not present

## 2023-08-28 DIAGNOSIS — R7303 Prediabetes: Secondary | ICD-10-CM | POA: Diagnosis not present

## 2023-08-28 DIAGNOSIS — R5383 Other fatigue: Secondary | ICD-10-CM | POA: Diagnosis not present

## 2023-08-28 DIAGNOSIS — I10 Essential (primary) hypertension: Secondary | ICD-10-CM | POA: Diagnosis not present

## 2023-09-06 DIAGNOSIS — I776 Arteritis, unspecified: Secondary | ICD-10-CM | POA: Diagnosis not present

## 2023-09-07 DIAGNOSIS — J432 Centrilobular emphysema: Secondary | ICD-10-CM | POA: Diagnosis not present

## 2023-09-07 DIAGNOSIS — J9611 Chronic respiratory failure with hypoxia: Secondary | ICD-10-CM | POA: Diagnosis not present

## 2023-09-07 DIAGNOSIS — G4733 Obstructive sleep apnea (adult) (pediatric): Secondary | ICD-10-CM | POA: Diagnosis not present

## 2023-09-14 DIAGNOSIS — J9611 Chronic respiratory failure with hypoxia: Secondary | ICD-10-CM | POA: Diagnosis not present

## 2023-09-25 DIAGNOSIS — E782 Mixed hyperlipidemia: Secondary | ICD-10-CM | POA: Diagnosis not present

## 2023-09-25 DIAGNOSIS — I10 Essential (primary) hypertension: Secondary | ICD-10-CM | POA: Diagnosis not present

## 2023-09-25 DIAGNOSIS — G4733 Obstructive sleep apnea (adult) (pediatric): Secondary | ICD-10-CM | POA: Diagnosis not present

## 2023-09-25 DIAGNOSIS — J9611 Chronic respiratory failure with hypoxia: Secondary | ICD-10-CM | POA: Diagnosis not present

## 2023-09-25 DIAGNOSIS — I2510 Atherosclerotic heart disease of native coronary artery without angina pectoris: Secondary | ICD-10-CM | POA: Diagnosis not present

## 2023-09-25 DIAGNOSIS — R7303 Prediabetes: Secondary | ICD-10-CM | POA: Diagnosis not present

## 2023-09-25 DIAGNOSIS — Z0000 Encounter for general adult medical examination without abnormal findings: Secondary | ICD-10-CM | POA: Diagnosis not present

## 2023-10-05 DIAGNOSIS — J432 Centrilobular emphysema: Secondary | ICD-10-CM | POA: Diagnosis not present

## 2023-10-05 DIAGNOSIS — Z87891 Personal history of nicotine dependence: Secondary | ICD-10-CM | POA: Diagnosis not present

## 2023-10-05 DIAGNOSIS — J9611 Chronic respiratory failure with hypoxia: Secondary | ICD-10-CM | POA: Diagnosis not present

## 2023-10-19 NOTE — Progress Notes (Signed)
 [facility] Pulmonary and Critical Care Medicine   Patient Name: Gerald Williams, [birth year]  Synopsis: Referred in May 2025 in the context of severe COPD, centrilobular emphysema, severe air trapping and chronic respiratory failure with hypoxemia Has been evaluated by [facility] lung transplant and was turned down, referred for lung volume reduction evaluation.  Also turndown for lung volume reduction by [facility] related to diffusion capacity under 20%.  Repeat high-res CT chest and pulmonary function testing performed with [facility] confirmed low DLCO.  Images were reviewed by advanced bronchoscopy team at [facility] and felt not to be a candidate for lung volume reduction procedure. Former smoker, smoked 1 pack of cigarettes daily, started age 31 quit age 45 History of congestive heart failure Has participated in pulmonary rehab several times Obstructive sleep apnea on CPAP  CC: ongoing management of advanced emphysema, chronic respiratory failure with hypoxemia  HPI:  History of Present Illness The patient returns to discuss Zephyr valve eligibility. He has advanced COPD, uses 6 L nasal cannula oxygen , has obstructive sleep apnea, and is considering Inspire device placement. Breztri  was started at the last visit.  COPD - Uses 6 L nasal cannula oxygen  - Increases to 8 L during ambulation - Reports difficulty opening his oxygen  tank - Used Breztri  without adverse effects, but was cost prohibitive - currently on Breyna  from [place] - Previously used Trelegy, which he can obtain free from [facility]  Lung Transplant - Declined for lung transplant in 04/2023 due to a vein issue near the lung, potentially causing paralysis if disturbed - Subsequent x-rays show no abnormalities - Awaiting final results to proceed with the transplant - Last lung function test at [facility] showed a decrease in DLCO from 28 to 18  Rehabilitation - Engages in light walking for  rehabilitation  Congestive Heart Failure - Has congestive heart failure - No history of heart attack  Supplemental information: The patient is considering Inspire device placement.  SOCIAL HISTORY Exercise: Walking as much as possible.    Past Medical History:  Diagnosis Date  . CHF (congestive heart failure)    (CMD)    Past Surgical History:  Procedure Laterality Date  . PROSTATE SURGERY        Exam: There were no vitals filed for this visit.  Gen: chronically ill appearing, no acute distress HEENT: NCAT OP clear EOMi PULM: Poor air movement B, normal effort CV: RRR, no mgr GI: BS+, soft, nontender MSK: normal bulk and tone Neuro: awake and alert, MAEW    STUDIES Imaging: May 2025 high-resolution CT scan of the chest: Scattered sub-4 mm micronodules in background of emphysema, images independently reviewed showing slight predominance of upper lobe predominant heterogeneous centrilobular emphysema.  Labs:   Path:   Micro:   PFT: April 2025 full PFT [facility]: Ratio 27%, FEV1 0.45 L (21%), TLC 6.43 L (106%), residual volume 4.43 L (201%), DLCO 4.2 (18.7%) June 2025 full PFT [facility]: Ratio 41%, FEV1 0.51 L (20%), intolerant of pleth, DLCO 4.12 (18.3%)   Echo:   Heart Cath:   Sleep study:   Ambulatory oximetry: April 2025 6-minute walk [facility]: Resting O2 saturation room air 94%, walked 1047 feet on 6 L nasal cannula O2 saturation lowest 98%  09/06/2024 6 min walk:  Resting O2 saturation measured on room air Titrated to 6 Lpm O2  Max HR  Date Value Ref Range Status  09/07/2023 108 bpm    Lowest SpO2  Date Value Ref Range Status  09/07/2023 84 %    6 Minute Walk Distance  Date Value Ref Range Status  09/07/2023 800 ft    Starting SpO2  Date Value Ref Range Status  09/07/2023 95 %      Impression: There are no diagnoses linked to this encounter.    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  I am unable to see documentation of exactly why he was turned down for transplant though he tells me that and a phone call he was told that he had a vein to close to his spine.  He has been evaluated for lung volume reduction at [facility] and at [facility] and at this time neither facility feel that that procedure would be medically beneficial.  He is compliant with his medications, has participated in pulmonary rehab several times.  At this time I think it would be reasonable for him to be reconsidered for lung transplant and I gave him the option of further discussion with [facility] versus discussing with our advanced pulmonary team at [facility].  He prefers the latter option at this time.  Plan: Assessment & Plan 1. Advanced COPD: Chronic. CT shows emphysema predominantly in lower lobes, potentially worsening with Zephyr valve. Diffusion capacity 18%, below threshold for Zephyr valve. - Transition from Breztri  to Trelegy, obtainable from [facility]. - Referral to Dr. Otilia at [facility] for lung transplant evaluation. - Continue 6 L nasal cannula oxygen . - Continue strength training exercises.  2. Pulmonary nodule. - Small nodule in right lower lobe. - Follow-up CT scheduled for 08/2024.  Follow-up - Follow up in 6 months.    Current Outpatient Medications  Medication Instructions  . albuterol  HFA (Ventolin  HFA) 90 mcg/actuation inhaler 2 puffs, Every 6 hours PRN  . aspirin  81 mg EC tablet Take  by mouth.  . atorvastatin  (LIPITOR ) 40 mg, Daily  . budesonide  (PULMICORT ) 0.25 mg/2 mL nebulizer solution BUDESONIDE  0.25MG /2ML SUSP,INH,2ML Active INHALE 0.25MG  (1 NEB) VIA NEBULIZER TWO TIMES A DAY FOR COPD - MIX WITH [facility] FOR COPD Sep 08, 2022 60 Sep 09, 2023 [id number] A Sep 28, 2022 BOBBIE VICENTA JAYSON BARI [facility]  . budesonide -formoteroL  (Symbicort ) 160-4.5 mcg/actuation inhaler 2 puffs, inhalation, 2 times daily RT  . budesonide -glycopyr-formoterol  (Breztri  Aerosphere) 160-9-4.8 mcg/actuation inhaler 2 puffs, inhalation, 2 times daily  . capsaicin (ZOSTRIX-HP) 0.1 % crea Apply topically.  . carboxymethylcellulose (REFRESH PLUS) 0.5 % dpet 2 drops, Every 1 hour prn  . cholecalciferol (VITAMIN D3) 1,000 unit (25 mcg) tablet Take by mouth.  . DULoxetine  (CYMBALTA ) 60 mg  . fluticasone  propionate (FLONASE ) 50 mcg/spray nasal spray Administer into affected nostril(s).  SABRA ipratropium (ATROVENT ) 0.02 % nebulizer solution Take 0.5 mg by nebulization 3 (three) times daily.  SABRA ipratropium-albuteroL  (Combivent  Respimat) 20-100 mcg/actuation inhaler 2 puffs  . ipratropium-albuteroL  (DUO-NEB) 0.5-2.5 mg/3 mL nebulizer solution Inhale.  . mirtazapine  (REMERON ) 30 mg  . naproxen  (NAPROSYN ) 375 mg  . prazosin  (MINIPRESS ) 2 mg  . sacubitriL -valsartan  (Entresto ) 97-103 mg per tablet Take by mouth.  . sildenafiL (Viagra) 100 mg tablet Take  by mouth.  . valsartan  (DIOVAN ) 80 mg tablet Take  by mouth.   Immunization History  Administered Date(s) Administered  . Moderna Covid-19, mRNA,LNP-S,PF 12+ Yrs 04/05/2022, 03/15/2023  . Moderna SARS-CoV-2 Primary Series 12+ yrs  05/19/2019, 06/16/2019    Thresa Lennert MD [facility] Pulmonary and Critical Care  Mobile [phone] Office: [phone]

## 2023-10-20 DIAGNOSIS — J9611 Chronic respiratory failure with hypoxia: Secondary | ICD-10-CM | POA: Diagnosis not present

## 2023-10-20 DIAGNOSIS — G4733 Obstructive sleep apnea (adult) (pediatric): Secondary | ICD-10-CM | POA: Diagnosis not present

## 2023-10-20 DIAGNOSIS — J432 Centrilobular emphysema: Secondary | ICD-10-CM | POA: Diagnosis not present

## 2023-10-31 ENCOUNTER — Other Ambulatory Visit: Payer: Self-pay

## 2023-10-31 ENCOUNTER — Encounter (HOSPITAL_COMMUNITY): Payer: Self-pay | Admitting: *Deleted

## 2023-10-31 ENCOUNTER — Emergency Department (HOSPITAL_COMMUNITY)

## 2023-10-31 ENCOUNTER — Emergency Department (HOSPITAL_COMMUNITY)
Admission: EM | Admit: 2023-10-31 | Discharge: 2023-10-31 | Disposition: A | Discharge status: Home / Self Care | Attending: Emergency Medicine | Admitting: Emergency Medicine

## 2023-10-31 DIAGNOSIS — Z7951 Long term (current) use of inhaled steroids: Secondary | ICD-10-CM | POA: Insufficient documentation

## 2023-10-31 DIAGNOSIS — Z79899 Other long term (current) drug therapy: Secondary | ICD-10-CM | POA: Diagnosis not present

## 2023-10-31 DIAGNOSIS — I509 Heart failure, unspecified: Secondary | ICD-10-CM | POA: Insufficient documentation

## 2023-10-31 DIAGNOSIS — J449 Chronic obstructive pulmonary disease, unspecified: Secondary | ICD-10-CM | POA: Insufficient documentation

## 2023-10-31 DIAGNOSIS — D649 Anemia, unspecified: Secondary | ICD-10-CM | POA: Diagnosis not present

## 2023-10-31 DIAGNOSIS — I110 Hypertensive heart disease with heart failure: Secondary | ICD-10-CM | POA: Insufficient documentation

## 2023-10-31 DIAGNOSIS — Z9981 Dependence on supplemental oxygen: Secondary | ICD-10-CM | POA: Diagnosis not present

## 2023-10-31 DIAGNOSIS — R0789 Other chest pain: Secondary | ICD-10-CM | POA: Diagnosis present

## 2023-10-31 LAB — COMPREHENSIVE METABOLIC PANEL WITH GFR
ALT: 14 U/L (ref 0–44)
AST: 19 U/L (ref 15–41)
Albumin: 3.8 g/dL (ref 3.5–5.0)
Alkaline Phosphatase: 64 U/L (ref 38–126)
Anion gap: 9 (ref 5–15)
BUN: 19 mg/dL (ref 8–23)
CO2: 34 mmol/L — ABNORMAL HIGH (ref 22–32)
Calcium: 9.3 mg/dL (ref 8.9–10.3)
Chloride: 101 mmol/L (ref 98–111)
Creatinine, Ser: 1.01 mg/dL (ref 0.61–1.24)
GFR, Estimated: >60 mL/min (ref >60)
Glucose, Bld: 81 mg/dL (ref 70–99)
Potassium: 4.3 mmol/L (ref 3.5–5.1)
Sodium: 144 mmol/L (ref 135–145)
Total Bilirubin: 0.8 mg/dL (ref 0.0–1.2)
Total Protein: 7.5 g/dL (ref 6.5–8.1)

## 2023-10-31 LAB — CBC WITH DIFFERENTIAL/PLATELET
Abs Immature Granulocytes: 0.01 K/uL (ref 0.00–0.07)
Basophils Absolute: 0 K/uL (ref 0.0–0.1)
Basophils Relative: 1 %
Eosinophils Absolute: 0.2 K/uL (ref 0.0–0.5)
Eosinophils Relative: 4 %
HCT: 33.4 % — ABNORMAL LOW (ref 39.0–52.0)
Hemoglobin: 10.4 g/dL — ABNORMAL LOW (ref 13.0–17.0)
Immature Granulocytes: 0 %
Lymphocytes Relative: 33 %
Lymphs Abs: 1.3 K/uL (ref 0.7–4.0)
MCH: 29.8 pg (ref 26.0–34.0)
MCHC: 31.1 g/dL (ref 30.0–36.0)
MCV: 95.7 fL (ref 80.0–100.0)
Monocytes Absolute: 0.5 K/uL (ref 0.1–1.0)
Monocytes Relative: 12 %
Neutro Abs: 2 K/uL (ref 1.7–7.7)
Neutrophils Relative %: 50 %
Platelets: 191 K/uL (ref 150–400)
RBC: 3.49 MIL/uL — ABNORMAL LOW (ref 4.22–5.81)
RDW: 13.4 % (ref 11.5–15.5)
WBC: 4 K/uL (ref 4.0–10.5)
nRBC: 0 % (ref 0.0–0.2)

## 2023-10-31 LAB — TROPONIN I (HIGH SENSITIVITY)
Troponin I (High Sensitivity): 6 ng/L (ref <18)
Troponin I (High Sensitivity): 7 ng/L (ref <18)

## 2023-10-31 LAB — BRAIN NATRIURETIC PEPTIDE: B Natriuretic Peptide: 100.6 pg/mL — ABNORMAL HIGH (ref 0.0–100.0)

## 2023-10-31 LAB — LIPASE, BLOOD: Lipase: 28 U/L (ref 11–51)

## 2023-10-31 MED ORDER — LIDOCAINE 5 % EX PTCH
1.0000 | MEDICATED_PATCH | CUTANEOUS | Status: DC
  Administered 2023-10-31: 1 via TRANSDERMAL
  Filled 2023-10-31: qty 1

## 2023-10-31 NOTE — ED Triage Notes (Signed)
 Here by POV from home for CP. Onset yesterday. Constant 8/10. Took metoprolol  and entresto  PTA. Always sob. Denies NV, cough, fever, or other sx. Pt of the [place]. Wears O2 [place] 6L at baseline. Alert, NAD, calm, interactive.

## 2023-10-31 NOTE — Discharge Instructions (Signed)
 Your workup to include EKG, chest x-ray, cardiac troponins was overall reassuring.  You had reproducible tenderness to palpation along your left chest wall which improved after application of a lidocaine  patch.  Recommend continued over-the-counter lidocaine  patch for additional pain control, OTC Tylenol  as well, follow-up with your PCP and cardiologist.

## 2023-10-31 NOTE — ED Provider Notes (Signed)
 [name] [facility]MENT AT [facility] Provider Note   CSN: [id number] Arrival date & time: 10/31/23  9084     Patient presents with: Chest Pain   Gerald Williams is a 73 y.o. male.    Chest Pain    73 year old male with medical history significant for polysubstance abuse, OSA, HTN, CHF (last EF 45 to 50% in 2023), COPD on 6 L to 8 L O2 via nasal cannula at baseline who presents to the emergency department with chest pain.  The patient states that for the past couple of days he has had left-sided chest wall pain, described as a dull ache.  He denies any cough, fevers or chills.  He denies any worsening shortness of breath, denies any lower extremity swelling or abdominal swelling.  He states that he is normally on 6 to 8 L O2 at baseline with his severe COPD.  Pain is not ripping or tearing, does not radiate to the back.   Prior to Admission medications   Medication Sig Start Date End Date Taking? Authorizing Provider  acetaminophen  (TYLENOL ) 500 MG tablet Take 1,000 mg by mouth every 6 (six) hours as needed for mild pain, fever or headache.    [provider]  albuterol  (PROVENTIL ) (2.5 MG/3ML) 0.083% nebulizer solution Take 2.5 mg by nebulization every 6 (six) hours as needed for wheezing or shortness of breath.    [provider]  atorvastatin  (LIPITOR ) 80 MG tablet Take 80 mg by mouth daily. Patient not taking: Reported on 01/04/2023 05/12/20   [provider]  diclofenac  Sodium (VOLTAREN ) 1 % GEL Apply 2 g topically 4 (four) times daily. Patient not taking: Reported on 01/04/2023 01/01/21   Stuart Vernell Norris, PA-C  DULoxetine  (CYMBALTA ) 60 MG capsule Take 60 mg by mouth 2 (two) times daily. 09/15/20   [provider]  fluticasone  (FLONASE ) 50 MCG/ACT nasal spray Place 2 sprays into both nostrils daily as needed for allergies. 12/29/20   [provider]  furosemide  (LASIX ) 20 MG tablet Take 1 tablet (20 mg total) by mouth  daily. Patient not taking: Reported on 01/04/2023 05/05/22   Barbaraann Darryle Ned, MD  ipratropium (ATROVENT ) 0.03 % nasal spray Place 1 spray into both nostrils every 8 (eight) hours as needed for rhinitis. 01/06/21   Darlean Ozell NOVAK, MD  Ipratropium-Albuterol  (COMBIVENT  RESPIMAT) 20-100 MCG/ACT AERS respimat Inhale 1 puff into the lungs every 6 (six) hours as needed for shortness of breath. Patient not taking: Reported on 01/04/2023 05/24/22   Darlean Ozell NOVAK, MD  methocarbamol  (ROBAXIN ) 500 MG tablet Take 1-2 tablets (500-1,000 mg total) by mouth 2 (two) times daily. 02/21/23   Cardama, Raynell Moder, MD  metoprolol  succinate (TOPROL -XL) 50 MG 24 hr tablet Take 1 tablet (50 mg total) by mouth daily. Take with or immediately following a meal. 05/05/22   [name], Darryle Ned, MD  mirtazapine  (REMERON ) 30 MG tablet Take 30 mg by mouth at bedtime. Patient not taking: Reported on 01/04/2023    [provider]  naproxen  (NAPROSYN ) 375 MG tablet Take 1 tablet (375 mg total) by mouth 2 (two) times daily. 02/21/23   Trine Raynell Moder, MD  OXYGEN  Inhale 4-5 L into the lungs continuous.    [provider]  polyethylene glycol (MIRALAX ) 17 g packet Take 17 g by mouth daily. 03/13/22   Jillian Buttery, MD  prazosin  (MINIPRESS ) 2 MG capsule Take 2 mg by mouth at bedtime. 08/23/21   [provider]  prazosin  (MINIPRESS ) 5 MG  capsule Take 1 capsule by mouth at bedtime. 08/23/21   [provider]  predniSONE  (DELTASONE ) 10 MG tablet Take 1 tablet (10 mg total) by mouth daily with breakfast. Take 4 pills for 3 days and then 3 pills for 3 days then 2 pills for 3 days then 1 pill for 3 days then stop Patient not taking: Reported on 01/04/2023 03/13/22   Jillian Buttery, MD  sacubitril -valsartan  (ENTRESTO ) 97-103 MG Take 1 tablet by mouth 2 (two) times daily. 01/04/23   Hildegard Loge, PA-C  spironolactone  (ALDACTONE ) 25 MG tablet Take 1 tablet (25 mg total) by mouth daily. Patient not  taking: Reported on 01/04/2023 05/05/22 04/30/23  Barbaraann Darryle Ned, MD  SYMBICORT  160-4.5 MCG/ACT inhaler Inhale 2 puffs into the lungs in the morning and at bedtime. 05/24/22   Darlean Ozell NOVAK, MD    Allergies: Tiotropium, Gabapentin, Nortriptyline , Budeson-glycopyrrol-formoterol , Olodaterol, Oxcarbazepine, and Tiotropium bromide -olodaterol    Review of Systems  Cardiovascular:  Positive for chest pain.  All other systems reviewed and are negative.   Updated Vital Signs BP (!) 145/90 (BP Location: Right Arm)   Pulse 61   Temp 97.8 F (36.6 C) (Oral)   Resp (!) 21   Ht 5' 5 (1.651 m)   Wt 74.8 kg   SpO2 100%   BMI 27.46 kg/m   Physical Exam Vitals and nursing note reviewed.  Constitutional:      General: He is not in acute distress.    Appearance: He is well-developed.  HENT:     Head: Normocephalic and atraumatic.  Eyes:     Conjunctiva/sclera: Conjunctivae normal.  Cardiovascular:     Rate and Rhythm: Normal rate and regular rhythm.     Heart sounds: No murmur heard.    Comments: Symmetric 2+ radial pulses Pulmonary:     Effort: Pulmonary effort is normal. No respiratory distress.     Breath sounds: Normal breath sounds.  Chest:     Comments: Left-sided chest wall tenderness to palpation that reproduces the patient's pain, no rash Abdominal:     Palpations: Abdomen is soft.     Tenderness: There is no abdominal tenderness.  Musculoskeletal:        General: No swelling.     Cervical back: Neck supple.  Skin:    General: Skin is warm and dry.     Capillary Refill: Capillary refill takes less than 2 seconds.  Neurological:     Mental Status: He is alert.  Psychiatric:        Mood and Affect: Mood normal.     (all labs ordered are listed, but only abnormal results are displayed) Labs Reviewed  COMPREHENSIVE METABOLIC PANEL WITH GFR - Abnormal; Notable for the following components:      Result Value   CO2 34 (*)    All other components within normal limits   CBC WITH DIFFERENTIAL/PLATELET - Abnormal; Notable for the following components:   RBC 3.49 (*)    Hemoglobin 10.4 (*)    HCT 33.4 (*)    All other components within normal limits  BRAIN NATRIURETIC PEPTIDE - Abnormal; Notable for the following components:   B Natriuretic Peptide 100.6 (*)    All other components within normal limits  LIPASE, BLOOD  TROPONIN I (HIGH SENSITIVITY)  TROPONIN I (HIGH SENSITIVITY)    EKG: EKG Interpretation Date/Time:  Tuesday October 31 2023 09:31:30 EDT Ventricular Rate:  68 PR Interval:  110 QRS Duration:  83 QT Interval:  404 QTC Calculation: 430 R  Axis:   59  Text Interpretation: Sinus rhythm Borderline short PR interval Nonspecific T abnormalities, lateral leads No significant change since last tracing Confirmed by Jerrol Agent ([id number]) on 10/31/2023 9:35:29 AM  Radiology: [facility] Chest Port 1 View Result Date: 10/31/2023 CLINICAL DATA:  355200 Chest pain 355200 EXAM: PORTABLE CHEST - 1 VIEW COMPARISON:  January 04, 2023 FINDINGS: Subsegmental atelectasis in the right mid lung. No focal airspace consolidation, pleural effusion, or pneumothorax. No cardiomegaly.No acute fracture or destructive lesion. IMPRESSION: No acute cardiopulmonary abnormality. Electronically Signed   By: Rogelia Myers M.D.   On: 10/31/2023 10:06     Procedures   Medications Ordered in the ED  lidocaine  (LIDODERM ) 5 % 1 patch (1 patch Transdermal Patch Applied 10/31/23 1039)                                    Medical Decision Making Amount and/or Complexity of Data Reviewed Labs: ordered.  Risk Prescription drug management.    73 year old male with medical history significant for polysubstance abuse, OSA, HTN, CHF (last EF 45 to 50% in 2023), COPD on 6 L to 8 L O2 via nasal cannula at baseline who presents to the emergency department with chest pain.  The patient states that for the past couple of days he has had left-sided chest wall pain, described as a dull ache.   He denies any cough, fevers or chills.  He denies any worsening shortness of breath, denies any lower extremity swelling or abdominal swelling.  He states that he is normally on 6 to 8 L O2 at baseline with his severe COPD.  Pain is not ripping or tearing, does not radiate to the back.   Medical Decision Making: Gerald Williams is a 73 y.o. male who presented to the ED today with chest pain, detailed above.    Complete initial physical exam performed, notably the patient was TTP along the chest wall, no rash, reproduces the patient's discomfort.   Reviewed and confirmed nursing documentation for past medical history, family history, social history.    Initial Assessment: With the patient's presentation of left-sided chest pain, most likely diagnosis is musculoskeletal chest pain versus GERD, although ACS remains on the differential. Other diagnoses were considered including (but not limited to) pulmonary embolism, community-acquired pneumonia, aortic dissection, pneumothorax, underlying bony abnormality, anemia. These are considered less likely due to history of present illness and physical exam findings.    In particular, concerning pulmonary embolism: low concern for PE based on HPI and exam. Aortic Dissection also considered but seems less likely based on the location, quality, onset, and severity of symptoms in this case.   Initial Plan: Evaluate for ACS with delta troponin and EKG evaluated as below  Evaluate for dissection, bony abnormality, or pneumonia with chest x-ray and screening laboratory evaluation including CBC, BMP  Further evaluation for pulmonary embolism not indicated at this time based on patient's PERC and Wells score.  Further evaluation for Thoracic Aortic Dissection not indicated at this time based on patient's clinical history and PE findings.   Initial Study Results: EKG was reviewed independently. Rate, rhythm, axis, intervals all examined and without medically relevant  abnormality. ST segments without concerns for elevations.    Laboratory  Delta troponin demonstrated normal values. BNP was almost normal at 100.6.   CBC and BMP without obvious metabolic or inflammatory abnormalities requiring further evaluation, mild anemia to 10.4 noted.  Radiology  DG Chest Port 1 View Result Date: 10/31/2023 CLINICAL DATA:  355200 Chest pain 355200 EXAM: PORTABLE CHEST - 1 VIEW COMPARISON:  January 04, 2023 FINDINGS: Subsegmental atelectasis in the right mid lung. No focal airspace consolidation, pleural effusion, or pneumothorax. No cardiomegaly.No acute fracture or destructive lesion. IMPRESSION: No acute cardiopulmonary abnormality. Electronically Signed   By: Rogelia Myers M.D.   On: 10/31/2023 10:06    Final Assessment and Plan: Patient reassessed, feeling symptomatically improved after lidocaine  patch.  He is at his baseline oxygen  saturation by nasal cannula.  His lungs are clear to auscultation bilaterally.  He had reproducible musculoskeletal pain which has since improved.  I recommended continued Tylenol  over-the-counter as well as over-the-counter lidocaine  patches for continued pain control, return precautions provided, he stable for outpatient follow-up with his cardiologist and PCP.      Final diagnoses:  Chest wall pain    ED Discharge Orders     None          Jerrol Agent, MD 10/31/23 1353

## 2023-12-06 DIAGNOSIS — I776 Arteritis, unspecified: Secondary | ICD-10-CM | POA: Diagnosis not present

## 2024-01-02 DIAGNOSIS — I776 Arteritis, unspecified: Secondary | ICD-10-CM | POA: Diagnosis not present

## 2024-01-23 NOTE — Progress Notes (Unsigned)
  Cardiology Office Note:  .   Date:  01/23/2024  ID:  Gerald Williams, DOB 1950/11/15, MRN [id number] PCP: [facility] Providers Cardiologist:  Darryle ONEIDA Decent, MD { Click to update primary MD,subspecialty MD or APP then REFRESH:1}   History of Present Illness: .   No chief complaint on file.   Gerald Williams is a 73 y.o. male with history of CHF, COPD, HTN who presents for follow-up.      Problem List Systolic HF -EF 35% 10/29/2020 -EF 45-50% 05/27/2021 -Normal LHC 10/30/2020 2. Pulmonary hypertension -PCWP 28 (Group 2) 3. COPD -Severe 4. HTN 5. HLD -T chol 244, HDL 79, LDL 147, TG 92    ROS: All other ROS reviewed and negative. Pertinent positives noted in the HPI.     Studies Reviewed: SABRA       TTE 05/27/2021  1. Left ventricular ejection fraction, by estimation, is 45 to 50%. Left  ventricular ejection fraction by 3D volume is 49 %. The left ventricle has  mildly decreased function. The left ventricle has no regional wall motion  abnormalities. Left ventricular   diastolic parameters are indeterminate.   2. Right ventricular systolic function is normal. The right ventricular  size is normal. Tricuspid regurgitation signal is inadequate for assessing  PA pressure.   3. The mitral valve is normal in structure. Trivial mitral valve  regurgitation. No evidence of mitral stenosis.   4. The aortic valve is normal in structure. Aortic valve regurgitation is  not visualized. No aortic stenosis is present.   5. The inferior vena cava is normal in size with greater than 50%  respiratory variability, suggesting right atrial pressure of 3 mmHg.  Physical Exam:   VS:  There were no vitals taken for this visit.   Wt Readings from Last 3 Encounters:  10/31/23 165 lb (74.8 kg)  02/21/23 161 lb (73 kg)  01/04/23 165 lb (74.8 kg)    GEN: Well nourished, well developed in no acute distress NECK: No JVD; No carotid bruits CARDIAC: ***RRR, no murmurs,  rubs, gallops RESPIRATORY:  Clear to auscultation without rales, wheezing or rhonchi  ABDOMEN: Soft, non-tender, non-distended EXTREMITIES:  No edema; No deformity  ASSESSMENT AND PLAN: .   ***    {Are you ordering a CV Procedure (e.g. stress test, cath, DCCV, TEE, etc)?   Press F2        :[id number]}   Follow-up: No follow-ups on file.  Time Spent with Patient: I have spent a total of *** minutes caring for this patient today face to face, ordering and reviewing labs/tests, reviewing prior records/medical history, examining the patient, establishing an assessment and plan, communicating results/findings to the patient/family, and documenting in the medical record.   Signed, Darryle ONEIDA. Decent, MD, [facility]  [facility]  [address] [place], [place] [address] [phone]  9:51 PM

## 2024-01-24 ENCOUNTER — Encounter (HOSPITAL_BASED_OUTPATIENT_CLINIC_OR_DEPARTMENT_OTHER): Payer: Self-pay | Admitting: Cardiovascular Disease

## 2024-01-24 ENCOUNTER — Ambulatory Visit (HOSPITAL_BASED_OUTPATIENT_CLINIC_OR_DEPARTMENT_OTHER): Admitting: Cardiovascular Disease

## 2024-01-24 ENCOUNTER — Ambulatory Visit: Admitting: Cardiovascular Disease

## 2024-01-24 VITALS — BP 108/70 | HR 94 | Ht 65.5 in | Wt 166.8 lb

## 2024-01-24 DIAGNOSIS — I272 Pulmonary hypertension, unspecified: Secondary | ICD-10-CM

## 2024-01-24 DIAGNOSIS — I10 Essential (primary) hypertension: Secondary | ICD-10-CM | POA: Diagnosis not present

## 2024-01-24 DIAGNOSIS — R072 Precordial pain: Secondary | ICD-10-CM

## 2024-01-24 DIAGNOSIS — I5022 Chronic systolic (congestive) heart failure: Secondary | ICD-10-CM

## 2024-01-24 MED ORDER — EMPAGLIFLOZIN 10 MG PO TABS: 10.0000 mg | ORAL_TABLET | Freq: Every day | ORAL | 3 refills | Status: AC

## 2024-01-24 NOTE — Patient Instructions (Addendum)
 Medication Instructions:  START JARDIANCE  10 MG DAILY   TAKE 2 METOPROLOL  2 HOURS PRIOR TO YOUR CARDIAC CT   *If you need a refill on your cardiac medications before your next appointment, please call your pharmacy*  Lab Work: BMET TODAY  If you have labs (blood work) drawn today and your tests are completely normal, you will receive your results only by: MyChart Message (if you have MyChart) OR A paper copy in the mail If you have any lab test that is abnormal or we need to change your treatment, we will call you to review the results.  Testing/Procedures: Your physician has requested that you have an echocardiogram. Echocardiography is a painless test that uses sound waves to create images of your heart. It provides your doctor with information about the size and shape of your heart and how well your heart's chambers and valves are working. This procedure takes approximately one hour. There are no restrictions for this procedure. Please do NOT wear cologne, perfume, aftershave, or lotions (deodorant is allowed). Please arrive 15 minutes prior to your appointment time.  Please note: We ask at that you not bring children with you during ultrasound (echo/ vascular) testing. Due to room size and safety concerns, children are not allowed in the ultrasound rooms during exams. Our front office staff cannot provide observation of children in our lobby area while testing is being conducted. An adult accompanying a patient to their appointment will only be allowed in the ultrasound room at the discretion of the ultrasound technician under special circumstances. We apologize for any inconvenience.   Your physician has requested that you have cardiac CT. Cardiac computed tomography (CT) is a painless test that uses an x-ray machine to take clear, detailed pictures of your heart. For further information please visit [email or web address] Please follow instruction sheet as given.   Follow-Up: At [facility], you and your health needs are our priority.  As part of our continuing mission to provide you with exceptional heart care, our providers are all part of one team.  This team includes your primary Cardiologist (physician) and Advanced Practice Providers or APPs (Physician Assistants and Nurse Practitioners) who all work together to provide you with the care you need, when you need it.  Your next appointment:   12 month(s)  Provider:   DR BARBARAANN OR APP   We recommend signing up for the patient portal called MyChart.  Sign up information is provided on this After Visit Summary.  MyChart is used to connect with patients for Virtual Visits (Telemedicine).  Patients are able to view lab/test results, encounter notes, upcoming appointments, etc.  Non-urgent messages can be sent to your provider as well.   To learn more about what you can do with MyChart, go to [email or web address].   Other Instructions    Your cardiac CT will be scheduled at one of the below locations:   [facility] [address] [place], [place] [address] [phone] (Severe contrast allergies only)  OR   [facility] [address] [place], [place] [address] [phone]  OR   [facility] [address] [place], [place] [address] [phone]  OR   [facility] and Vascular Tower [address]  [place], [place] [address] [phone]  OR   [facility] [address] [place], [place] [phone]  If scheduled at [facility], please arrive at the [facility] and Children's Entrance (Entrance C2) of  [facility] 30 minutes prior to test start time. You can use the FREE valet parking offered at entrance C (encouraged to control the heart rate for the test)  Proceed to the [facility] Radiology Department (first floor) to check-in and test prep.  All radiology patients and guests should use entrance C2  at [facility], accessed from [facility], even though the hospital's physical address listed is [address].  If scheduled at the Heart and Vascular Tower at [address], please enter the parking lot using the [address] entrance and use the FREE valet service at the patient drop-off area. Enter the building and check-in with registration on the main floor.  If scheduled at [facility], please arrive to the Heart and Vascular Center 15 mins early for check-in and test prep.  There is spacious parking and easy access to the radiology department from the [facility] Heart and Vascular entrance. Please enter here and check-in with the desk attendant.   If scheduled at [facility], please arrive 30 minutes early for check-in and test prep.  Please follow these instructions carefully (unless otherwise directed):  An IV will be required for this test and Nitroglycerin  will be given.  Hold all erectile dysfunction medications at least 3 days (72 hrs) prior to test. (Ie viagra, cialis, sildenafil, tadalafil, etc)   On the Night Before the Test: Be sure to Drink plenty of water . Do not consume any caffeinated/decaffeinated beverages or chocolate 12 hours prior to your test. Do not take any antihistamines 12 hours prior to your test.  On the Day of the Test: Drink plenty of water  until 1 hour prior to the test. Do not eat any food 1 hour prior to test. You may take your regular medications prior to the test.  Take metoprolol  (Lopressor ) two hours prior to test. If you take Furosemide /Hydrochlorothiazide/Spironolactone /Chlorthalidone, please HOLD on the morning of the test. Patients who wear a continuous glucose monitor MUST remove the device prior to scanning.      After the Test: Drink plenty of water . After receiving IV contrast, you may experience a mild flushed feeling. This is normal. On occasion, you may experience a mild rash  up to 24 hours after the test. This is not dangerous. If this occurs, you can take Benadryl 25 mg, Zyrtec, Claritin, or Allegra and increase your fluid intake. (Patients taking Tikosyn should avoid Benadryl, and may take Zyrtec, Claritin, or Allegra) If you experience trouble breathing, this can be serious. If it is severe call [phone] IMMEDIATELY. If it is mild, please call our office.  We will call to schedule your test 2-4 weeks out understanding that some insurance companies will need an authorization prior to the service being performed.   For more information and frequently asked questions, please visit our website : [email or web address]  For non-scheduling related questions, please contact the cardiac imaging nurse navigator should you have any questions/concerns: Cardiac Imaging Nurse Navigators Direct Office Dial: [phone]   For scheduling needs, including cancellations and rescheduling, please call [place], [phone].     Cardiac CT Angiogram A cardiac CT angiogram is a procedure to look at the heart and the area around the heart. It may be done to help find the cause of chest pains or other symptoms of heart disease. During this procedure, a substance called contrast dye is injected into a vein in the arm. The contrast highlights the blood vessels in the area to be checked. A  large X-ray machine (CT scanner), then takes detailed pictures of the heart and the surrounding area. The procedure is also sometimes called a coronary CT angiogram, coronary artery scanning, or CTA. A cardiac CT angiogram allows the health care provider to see how well blood is flowing to and from the heart. The provider will be able to see if there are any problems, such as: Blockage or narrowing of the arteries in the heart. Fluid around the heart. Signs of weakness or disease in the muscles, valves, and tissues of the heart. Tell a health care provider about: Any allergies you have. This is  especially important if you have had a previous allergic reaction to medicines, contrast dye, or iodine. All medicines you are taking, including vitamins, herbs, eye drops, creams, and over-the-counter medicines. Any bleeding problems you have. Any surgeries you have had. Any medical conditions you have, including kidney problems or kidney failure. Whether you are pregnant or may be pregnant. Any anxiety disorders, chronic pain, or other conditions you have. These may increase your stress or prevent you from lying still. Any history of abnormal heart rhythms or heart procedures. What are the risks? Your provider will talk with you about risks. These may include: Bleeding. Infection. Allergic reactions to medicines or dyes. Damage to other structures or organs. Kidney damage from the contrast dye. Increased risk of cancer from radiation exposure. This risk is low. Talk with your provider about: The risks and benefits of testing. How you can receive the lowest dose of radiation. What happens before the procedure? Wear comfortable clothing and remove any jewelry, glasses, dentures, and hearing aids. Follow instructions from your provider about eating and drinking. These may include: 12 hours before the procedure Avoid caffeine. This includes tea, coffee, soda, energy drinks, and diet pills. Drink plenty of water  or other fluids that do not have caffeine in them. Being well hydrated can prevent complications. 4-6 hours before the procedure Stop eating and drinking. This will reduce the risk of nausea from the contrast dye. Ask your provider about changing or stopping your regular medicines. These include: Diabetes medicines. Medicines to treat problems with erections (erectile dysfunction). If you have kidney problems, you may need to receive IV hydration before and after the test. What happens during the procedure?  Hair on your chest may need to be removed so that small sticky patches  called electrodes can be placed on your chest. These will transmit information that helps to monitor your heart during the procedure. An IV will be inserted into one of your veins. You might be given a medicine to control your heart rate during the procedure. This will help to ensure that good images are obtained. You will be asked to lie on an exam table. This table will slide in and out of the CT machine during the procedure. Contrast dye will be injected into the IV. You might feel warm, or you may get a metallic taste in your mouth. You may be given medicines to relax or dilate the arteries in your heart. If you are allergic to contrast dyes or iodine you may be given medicine before the test to reduce the risk of an allergic reaction. The table that you are lying on will move into the CT machine tunnel for the scan. The person running the machine will give you instructions while the scans are being done. You may be asked to: Keep your arms above your head. Hold your breath for short periods. Stay very still, even  if the table is moving. The procedure may vary among providers and hospitals. What can I expect after the procedure? After your procedure, it is common to have: A metallic taste in your mouth from the contrast dye. A feeling of warmth. A headache from the heart medicine. Follow these instructions at home: Take over-the-counter and prescription medicines only as told by your provider. If you are told, drink enough fluid to keep your pee pale yellow. This will help to flush the contrast dye out of your body. Most people can return to their normal activities right after the procedure. Ask your provider what activities are safe for you. It is up to you to get the results of your procedure. Ask your provider, or the department that is doing the procedure, when your results will be ready. Contact a health care provider if: You have any symptoms of allergy to the contrast dye. These  include: Shortness of breath. Rash or hives. A racing heartbeat. You notice a change in your peeing (urination). This information is not intended to replace advice given to you by your health care provider. Make sure you discuss any questions you have with your health care provider. Document Revised: 11/12/2021 Document Reviewed: 11/12/2021 Elsevier Patient Education  2024 [employer].

## 2024-01-31 ENCOUNTER — Telehealth (HOSPITAL_COMMUNITY): Payer: Self-pay | Admitting: Emergency Medicine

## 2024-01-31 NOTE — Telephone Encounter (Signed)
 Reaching out to patient to offer assistance regarding upcoming cardiac imaging study; pt verbalizes understanding of appt date/time, parking situation and where to check in, pre-test NPO status and medications ordered, and verified current allergies; name and call back number provided for further questions should they arise Rockwell Alexandria RN Navigator Cardiac Imaging [facility] [phone] office [phone] cell

## 2024-02-01 ENCOUNTER — Ambulatory Visit (HOSPITAL_COMMUNITY)
Admission: RE | Admit: 2024-02-01 | Discharge: 2024-02-01 | Disposition: A | Discharge status: Home / Self Care | Source: Ambulatory Visit | Attending: Cardiology | Admitting: Cardiology

## 2024-02-01 ENCOUNTER — Ambulatory Visit: Payer: Self-pay | Admitting: Cardiovascular Disease

## 2024-02-01 DIAGNOSIS — I10 Essential (primary) hypertension: Secondary | ICD-10-CM | POA: Diagnosis not present

## 2024-02-01 DIAGNOSIS — R072 Precordial pain: Secondary | ICD-10-CM | POA: Insufficient documentation

## 2024-02-01 DIAGNOSIS — I5022 Chronic systolic (congestive) heart failure: Secondary | ICD-10-CM | POA: Insufficient documentation

## 2024-02-01 MED ORDER — NITROGLYCERIN 0.4 MG SL SUBL
0.8000 mg | SUBLINGUAL_TABLET | Freq: Once | SUBLINGUAL | Status: AC
  Administered 2024-02-01: 0.8 mg via SUBLINGUAL

## 2024-02-01 MED ORDER — IOHEXOL 350 MG/ML SOLN
100.0000 mL | Freq: Once | INTRAVENOUS | Status: AC | PRN
  Administered 2024-02-01: 100 mL via INTRAVENOUS

## 2024-02-07 DIAGNOSIS — I776 Arteritis, unspecified: Secondary | ICD-10-CM | POA: Diagnosis not present

## 2024-02-20 ENCOUNTER — Other Ambulatory Visit (HOSPITAL_BASED_OUTPATIENT_CLINIC_OR_DEPARTMENT_OTHER)

## 2024-03-11 ENCOUNTER — Ambulatory Visit (INDEPENDENT_AMBULATORY_CARE_PROVIDER_SITE_OTHER)

## 2024-03-11 DIAGNOSIS — R072 Precordial pain: Secondary | ICD-10-CM

## 2024-03-11 DIAGNOSIS — I5022 Chronic systolic (congestive) heart failure: Secondary | ICD-10-CM | POA: Diagnosis not present

## 2024-03-11 DIAGNOSIS — I10 Essential (primary) hypertension: Secondary | ICD-10-CM

## 2024-03-12 LAB — ECHOCARDIOGRAM COMPLETE
Area-P 1/2: 3.42 cm2
S' Lateral: 2.09 cm
